# Patient Record
Sex: Male | Born: 1950 | Race: White | Hispanic: No | Marital: Married | Smoking: Former smoker
Health system: Southern US, Community
[De-identification: ages and names within clinical notes are randomized; demographics above are authoritative.]

## PROBLEM LIST (undated history)

## (undated) DIAGNOSIS — Z860100 Personal history of colon polyps, unspecified: Secondary | ICD-10-CM

## (undated) DIAGNOSIS — Z923 Personal history of irradiation: Secondary | ICD-10-CM

## (undated) DIAGNOSIS — I4891 Unspecified atrial fibrillation: Secondary | ICD-10-CM

## (undated) DIAGNOSIS — Z8739 Personal history of other diseases of the musculoskeletal system and connective tissue: Secondary | ICD-10-CM

## (undated) DIAGNOSIS — G4700 Insomnia, unspecified: Secondary | ICD-10-CM

## (undated) DIAGNOSIS — M2550 Pain in unspecified joint: Secondary | ICD-10-CM

## (undated) DIAGNOSIS — I70219 Atherosclerosis of native arteries of extremities with intermittent claudication, unspecified extremity: Secondary | ICD-10-CM

## (undated) DIAGNOSIS — R351 Nocturia: Secondary | ICD-10-CM

## (undated) DIAGNOSIS — M179 Osteoarthritis of knee, unspecified: Secondary | ICD-10-CM

## (undated) DIAGNOSIS — M1712 Unilateral primary osteoarthritis, left knee: Secondary | ICD-10-CM

## (undated) DIAGNOSIS — Z7189 Other specified counseling: Secondary | ICD-10-CM

## (undated) DIAGNOSIS — I639 Cerebral infarction, unspecified: Secondary | ICD-10-CM

## (undated) DIAGNOSIS — I6529 Occlusion and stenosis of unspecified carotid artery: Secondary | ICD-10-CM

## (undated) DIAGNOSIS — E785 Hyperlipidemia, unspecified: Secondary | ICD-10-CM

## (undated) DIAGNOSIS — E1151 Type 2 diabetes mellitus with diabetic peripheral angiopathy without gangrene: Secondary | ICD-10-CM

## (undated) DIAGNOSIS — C3490 Malignant neoplasm of unspecified part of unspecified bronchus or lung: Secondary | ICD-10-CM

## (undated) DIAGNOSIS — M1710 Unilateral primary osteoarthritis, unspecified knee: Secondary | ICD-10-CM

## (undated) DIAGNOSIS — I10 Essential (primary) hypertension: Secondary | ICD-10-CM

## (undated) DIAGNOSIS — I739 Peripheral vascular disease, unspecified: Secondary | ICD-10-CM

## (undated) HISTORY — DX: Peripheral vascular disease, unspecified: I73.9

## (undated) HISTORY — PX: OTHER SURGICAL HISTORY: SHX169

## (undated) HISTORY — DX: Unilateral primary osteoarthritis, unspecified knee: M17.10

## (undated) HISTORY — DX: Essential (primary) hypertension: I10

## (undated) HISTORY — PX: COLONOSCOPY: SHX174

## (undated) HISTORY — DX: Other specified counseling: Z71.89

## (undated) HISTORY — DX: Hyperlipidemia, unspecified: E78.5

## (undated) HISTORY — DX: Atherosclerosis of native arteries of extremities with intermittent claudication, unspecified extremity: I70.219

## (undated) HISTORY — DX: Type 2 diabetes mellitus with diabetic peripheral angiopathy without gangrene: E11.51

## (undated) HISTORY — DX: Osteoarthritis of knee, unspecified: M17.9

## (undated) HISTORY — DX: Occlusion and stenosis of unspecified carotid artery: I65.29

## (undated) HISTORY — DX: Personal history of irradiation: Z92.3

---

## 2004-07-15 ENCOUNTER — Ambulatory Visit (HOSPITAL_COMMUNITY): Admission: RE | Admit: 2004-07-15 | Discharge: 2004-07-15 | Payer: Self-pay | Admitting: Vascular Surgery

## 2005-11-21 ENCOUNTER — Encounter: Admission: RE | Admit: 2005-11-21 | Discharge: 2005-11-21 | Payer: Self-pay | Admitting: Family Medicine

## 2007-01-13 ENCOUNTER — Encounter: Admission: RE | Admit: 2007-01-13 | Discharge: 2007-01-13 | Payer: Self-pay | Admitting: Family Medicine

## 2009-11-02 HISTORY — PX: KNEE ARTHROSCOPY: SUR90

## 2009-11-02 HISTORY — PX: MENISCUS REPAIR: SHX5179

## 2011-11-10 ENCOUNTER — Encounter: Payer: Self-pay | Admitting: Vascular Surgery

## 2011-11-18 ENCOUNTER — Encounter: Payer: Self-pay | Admitting: Vascular Surgery

## 2011-11-19 ENCOUNTER — Ambulatory Visit (INDEPENDENT_AMBULATORY_CARE_PROVIDER_SITE_OTHER): Payer: 59 | Admitting: *Deleted

## 2011-11-19 ENCOUNTER — Encounter (INDEPENDENT_AMBULATORY_CARE_PROVIDER_SITE_OTHER): Payer: 59 | Admitting: *Deleted

## 2011-11-19 ENCOUNTER — Encounter: Payer: Self-pay | Admitting: Vascular Surgery

## 2011-11-19 ENCOUNTER — Ambulatory Visit (INDEPENDENT_AMBULATORY_CARE_PROVIDER_SITE_OTHER): Payer: 59 | Admitting: Vascular Surgery

## 2011-11-19 VITALS — BP 133/80 | HR 92 | Temp 98.2°F | Ht 73.0 in | Wt 211.0 lb

## 2011-11-19 DIAGNOSIS — I998 Other disorder of circulatory system: Secondary | ICD-10-CM | POA: Insufficient documentation

## 2011-11-19 DIAGNOSIS — M79609 Pain in unspecified limb: Secondary | ICD-10-CM

## 2011-11-19 DIAGNOSIS — I739 Peripheral vascular disease, unspecified: Secondary | ICD-10-CM

## 2011-11-19 DIAGNOSIS — I70219 Atherosclerosis of native arteries of extremities with intermittent claudication, unspecified extremity: Secondary | ICD-10-CM

## 2011-11-19 NOTE — Procedures (Unsigned)
LOWER EXTREMITY ARTERIAL DUPLEX  INDICATION:  Peripheral vascular disease, claudication.  HISTORY: Diabetes:  Yes. Cardiac:  No. Hypertension:  Yes. Smoking:  Yes. Previous Surgery:  SINGLE LEVEL ARTERIAL EXAM                         RIGHT                LEFT Brachial: Anterior tibial: Posterior tibial: Peroneal: Ankle/Brachial Index:   0.77                 0.48 Prior ABI: 06/13/2005   0.78                 0.56  LOWER EXTREMITY ARTERIAL DUPLEX EXAM   DUPLEX: 1. Right:  Significant plaque throughout the lower extremity with     visualized stenosis in the proximal to mid SFA with monophasic     waveforms from the proximal SFA on distally.  Dampened monophasic     waveform visualized at the posterior tibial artery. 2. Left:  No color-flow or Doppler signal proximal to distal femoral     artery with a collateral visualized in the distal femoral artery,     supplying flow to the popliteal artery.  IMPRESSION: 1. Proximal to mid right superficial femoral artery stenosis. 2. 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 3. Ankle brachial indices are on attached sheet.  ___________________________________________ Di Kindle. Edilia Bo, M.D.  SS/MEDQ  D:  11/19/2011  T:  11/19/2011  Job:  [id number]

## 2011-11-19 NOTE — Progress Notes (Signed)
Vascular and Vein Specialist of [facility]  Patient name: Jesse Avila MRN: [id number] DOB: Jun 25, 1951 Sex: male  REASON FOR CONSULT: Peripheral vascular disease referred by Dr. Jeanmarie Hubert  HPI: Jesse Avila is a 61 y.o. male who I had seen back in 2006 although I do not have these records. He's had a long history of bilateral lower extremity claudication. He experiences pain in his calves which is brought on by ambulation and relieved with rest. He symptoms start on the right side and then progressed the left side. This occurs after approximately 2 blocks. There were no other aggravating or alleviating factors. He's had no rest pain and no history of nonhealing wounds. He has had some burning and tingling in both feet consistent with neuropathy.  Apparently back in 2006 he was potentially a candidate for stenting of the superficial femoral artery although at that time he did not wish to proceed. I suspect that his disease has progressed significantly since that time.   Past Medical History  Diagnosis Date  . Hyperlipidemia   . Peripheral vascular disease     With moderate Claudication  . Hypertension   . Diabetes mellitus 8/20012    Type II  . GERD (gastroesophageal reflux disease)   . Arthritis     Gout  . ED (erectile dysfunction)   . 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 Diabetes Mother   . Hypertension Mother   . Heart disease Mother     Coronary Artery Bypass Graft  . Hyperlipidemia Mother   . Heart attack Mother   . Hypertension Father   . Hyperlipidemia Sister   . Stroke Sister     SOCIAL HISTORY: History  Substance Use Topics  . Smoking status: Current Everyday Smoker -- 1.0 packs/day for 30 years    Types: Cigarettes  . Smokeless tobacco: Never Used  . Alcohol Use: 7.2 oz/week    12 Cans of beer per week    Allergies  Allergen Reactions  . Cialis (Tadalafil) Other (See Comments)   Headache    Current Outpatient Prescriptions  Medication Sig Dispense Refill  . aspirin 81 MG tablet Take 81 mg by mouth daily.      . fenofibrate (TRICOR) 145 MG tablet Take 160 mg by mouth daily. For triglycerides      . glucose blood test strip 1 each by Other route as needed. Use as instructed      . olmesartan (BENICAR) 40 MG tablet Take 40 mg by mouth as directed.      . rosuvastatin (CRESTOR) 40 MG tablet Take 40 mg by mouth daily. For cholesterol      . cilostazol (PLETAL) 100 MG tablet Take 100 mg by mouth 2 (two) times daily. To reduce claudication symptoms        REVIEW OF SYSTEMS: Arly.Keller ] denotes positive finding; [  ] denotes negative finding  CARDIOVASCULAR:  [ ]  chest pain   [ ]  chest pressure   [ ]  palpitations   [ ]  orthopnea   [ ]  dyspnea on exertion   Arly.Keller ] claudication- bilateral   [ ]  rest pain   [ ]  DVT   [ ]  phlebitis PULMONARY:   [ ]  productive cough   [ ]  asthma   [ ]  wheezing NEUROLOGIC:   [ ]  weakness  [ ]  paresthesias  [ ]  aphasia  [ ]  amaurosis  [ ]   dizziness HEMATOLOGIC:   [ ]  bleeding problems   [ ]  clotting disorders MUSCULOSKELETAL:  [ ]  joint pain   [ ]  joint swelling [ ]  leg swelling GASTROINTESTINAL: [ ]   blood in stool  [ ]   hematemesis GENITOURINARY:  [ ]   dysuria  [ ]   hematuria PSYCHIATRIC:  [ ]  history of major depression INTEGUMENTARY:  [ ]  rashes  [ ]  ulcers CONSTITUTIONAL:  [ ]  fever   [ ]  chills  PHYSICAL EXAM: Filed Vitals:   11/19/11 1525  BP: 133/80  Pulse: 92  Temp: 98.2 F (36.8 C)  TempSrc: Oral  Height: 6\' 1"  (1.854 m)  Weight: 211 lb (95.709 kg)   Body mass index is 27.84 kg/(m^2). GENERAL: The patient is a well-nourished male, in no acute distress. The vital signs are documented above. CARDIOVASCULAR: There is a regular rate and rhythm without significant murmur appreciated. I do not detect carotid bruits. He has palpable femoral pulses. I cannot palpate popliteal or pedal pulses. Both feet appear adequately perfused. He has no  significant lower extremity swelling. PULMONARY: There is good air exchange bilaterally without wheezing or rales. ABDOMEN: Soft and non-tender with normal pitched bowel sounds. I do not palpate an abdominal aortic aneurysm. MUSCULOSKELETAL: There are no major deformities or cyanosis. NEUROLOGIC: No focal weakness or paresthesias are detected. SKIN: There are no ulcers or rashes noted. He has no ischemic ulcers. PSYCHIATRIC: The patient has a normal affect.  DATA:  I have independently interpreted his arterial Doppler study which shows an ABI of 77% on the right and 42% on the left. He has monophasic Doppler signals in the posterior tibial and dorsalis pedis positions bilaterally.  I've also independently interpreted his duplex of his native arteries which shows evidence of an occluded superficial femoral artery on the left with moderate to severe proximal superficial femoral artery occlusive disease on the right. This suggests significant progression of his disease on the left compared to his arteriogram back in 2006.  MEDICAL ISSUES:  PERIPHERAL VASCULAR DISEASE: This patient has bilateral infrainguinal arterial occlusive disease which is more significant on the left side. He has an occluded superficial femoral artery on the left with reconstitution of the popliteal artery. We have had a long discussion about the importance of tobacco cessation. His wife notes that Dr. Manus Gunning is also spent a lot of time discussing this with him. I have offered to refer [facility]. Also discussed the importance of a structured walking program. I've explained that his disease is likely progressed significantly since his arteriogram back in 2006 when he was amenable to angioplasty. At this point, based on his duplex he would more likely require a bypass graft. He feels that his symptoms are significantly disabling however he would like to proceed with arteriography at least to see what  options he has. I think this is reasonable.  I have reviewed with the patient the indications for arteriography. In addition, I have reviewed the potential complications of arteriography including but not limited to: Bleeding, arterial injury, arterial thrombosis, dye action, renal insufficiency, or other unpredictable medical problems. I have explained to the patient that if we find disease amenable to angioplasty we could potentially address this at the same time. I have discussed the potential complications of angioplasty and stenting, including but not limited to: Bleeding, arterial thrombosis, arterial injury, dissection, or the need for surgical intervention. His arteriogram is scheduled for 12/01/2011. We'll make further recommendations pending these results.  Joslin Doell S [facility] [place] [facility]: [phone]

## 2011-11-20 ENCOUNTER — Telehealth: Payer: Self-pay | Admitting: *Deleted

## 2011-11-20 NOTE — Telephone Encounter (Signed)
I called pt to schedule am agm with runoff possible intervention discussed with Dr Edilia Bo 11/19/11. Jesse Avila said he wanted to hold off at this time and would call me back when he decided to schedule.

## 2011-12-11 DIAGNOSIS — N529 Male erectile dysfunction, unspecified: Secondary | ICD-10-CM | POA: Insufficient documentation

## 2011-12-11 DIAGNOSIS — M1990 Unspecified osteoarthritis, unspecified site: Secondary | ICD-10-CM | POA: Insufficient documentation

## 2011-12-11 DIAGNOSIS — I739 Peripheral vascular disease, unspecified: Secondary | ICD-10-CM | POA: Insufficient documentation

## 2011-12-11 DIAGNOSIS — M1710 Unilateral primary osteoarthritis, unspecified knee: Secondary | ICD-10-CM | POA: Insufficient documentation

## 2011-12-11 DIAGNOSIS — I10 Essential (primary) hypertension: Secondary | ICD-10-CM | POA: Insufficient documentation

## 2011-12-11 DIAGNOSIS — K219 Gastro-esophageal reflux disease without esophagitis: Secondary | ICD-10-CM | POA: Insufficient documentation

## 2011-12-11 DIAGNOSIS — E785 Hyperlipidemia, unspecified: Secondary | ICD-10-CM | POA: Insufficient documentation

## 2011-12-12 ENCOUNTER — Ambulatory Visit (INDEPENDENT_AMBULATORY_CARE_PROVIDER_SITE_OTHER): Payer: 59 | Admitting: Cardiovascular Disease

## 2011-12-12 ENCOUNTER — Encounter: Payer: Self-pay | Admitting: Cardiovascular Disease

## 2011-12-12 VITALS — BP 120/71 | HR 76 | Ht 73.0 in | Wt 210.0 lb

## 2011-12-12 DIAGNOSIS — I70219 Atherosclerosis of native arteries of extremities with intermittent claudication, unspecified extremity: Secondary | ICD-10-CM

## 2011-12-12 DIAGNOSIS — E785 Hyperlipidemia, unspecified: Secondary | ICD-10-CM

## 2011-12-12 DIAGNOSIS — I739 Peripheral vascular disease, unspecified: Secondary | ICD-10-CM

## 2011-12-12 DIAGNOSIS — Z01810 Encounter for preprocedural cardiovascular examination: Secondary | ICD-10-CM

## 2011-12-12 DIAGNOSIS — I10 Essential (primary) hypertension: Secondary | ICD-10-CM

## 2011-12-12 NOTE — Patient Instructions (Addendum)
Your physician recommends that you schedule a follow-up appointment in:PER  PT WILL FOLLOW UP WITH DR Edilia Bo Your physician recommends that you continue on your current medications as directed. Please refer to the Current Medication list given to you today. Your physician has requested that you have a lexiscan myoview. For further information please visit [email or web address] Please follow instruction sheet, as given. DX PVD

## 2011-12-12 NOTE — Assessment & Plan Note (Addendum)
PVD moderate to severe on left ABI .56 .  ? Benefit to starting Pletal.  Given total occlusion I would think aortobimem is indicated.  Told patient that I dont think Dr Edilia Bo or our PV doctors should/would offer Rx until he quit smoking.  He understands the imperitive and will talk to his primary Dr Manus Gunning about welbutrin or Chantix.  Will schedule to see one of our PV doctors and patient can decide on who he wants to do angiogram.  However given total occlusion on most symptomatic side I suspect he will need to have Dr Edilia Bo perform surgery

## 2011-12-12 NOTE — Assessment & Plan Note (Signed)
HTN,PVD, Smoking and elevated lipids  Lexiscan myovue to clear for vascular surgery.

## 2011-12-12 NOTE — Progress Notes (Signed)
Patient ID: Jesse Avila, male   DOB: [birth year], 61 y.o.   MRN: [id number] 61 yo smoker seeking second opinion about PVD.  Unfortunately did not get set up with one of our PV doctors.  He is a chronic smoker.  Has been seen for a long time by Dr Cari Caraway.  They where a little taken back with last encounter.  ? They thought he would be a stent candidate over the years and recent ABI/Dulex suggested that surgery would be better option.  Reviewed study frmo 4/13  Ankle/Brachial Index: 0.77 0.48  Prior ABI: 06/13/2005 0.78 0.56  LOWER EXTREMITY ARTERIAL DUPLEX EXAM  DUPLEX:  1. Right: Significant plaque throughout the lower extremity with  visualized stenosis in the proximal to mid SFA with monophasic  waveforms from the proximal SFA on distally. Dampened monophasic  waveform visualized at the posterior tibial artery.  2. Left: No color-flow or Doppler signal proximal to distal femoral  artery with a collateral visualized in the distal femoral artery,  supplying flow to the popliteal artery.  IMPRESSION:  1. Proximal to mid right superficial femoral artery stenosis.  2. 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  3. Ankle brachial indices are on attached sheet.  ABI .56 on left and .75 on right  He has had claudication in calf for years Worse the last 6 months.  No resting pain or ulcers.  Works in [employer] and his PVD is affecting this.  No other vascular disease and no history of SSCP or CAD.    ROS: Denies fever, malais, weight loss, blurry vision, decreased visual acuity, cough, sputum, SOB, hemoptysis, pleuritic pain, palpitaitons, heartburn, abdominal pain, melena, lower extremity edema, claudication, or rash.  All other systems reviewed and negative   General: Affect appropriate Healthy:  appears stated age HEENT: normal Neck supple with no adenopathy JVP normal no bruits no thyromegaly Lungs clear with no wheezing and good diaphragmatic motion Heart:  S1/S2 no  murmur,rub, gallop or click PMI normal Abdomen: benighn, BS positve, no tenderness, no AAA Left femoral  bruit.  No HSM or HJR Plus one bilateral popliteal pulses Unable to palpate DP/PT No edema Neuro non-focal Skin warm and dry No muscular weakness  Medications Current Outpatient Prescriptions  Medication Sig Dispense Refill  . aspirin 81 MG tablet Take 81 mg by mouth daily.      . fenofibrate (TRICOR) 145 MG tablet Take 160 mg by mouth daily. For triglycerides      . glucose blood test strip 1 each by Other route as needed. Use as instructed      . olmesartan (BENICAR) 40 MG tablet Take 20 mg by mouth as directed.       . rosuvastatin (CRESTOR) 40 MG tablet Take 20 mg by mouth daily. For cholesterol        Allergies Cialis  Family History: Family History  Problem Relation Age of Onset  . Diabetes Mother   . Hypertension Mother   . Heart disease Mother     Coronary Artery Bypass Graft  . Hyperlipidemia Mother   . Heart attack Mother   . Hypertension Father   . Hyperlipidemia Sister   . Stroke Sister     Social History: History   Social History  . Marital Status: Married    Spouse Name: N/A    Number of Children: N/A  . Years of Education: N/A   Occupational History  . Not on file.   Social History Main Topics  .  Smoking status: Current Everyday Smoker -- 1.0 packs/day for 30 years    Types: Cigarettes  . Smokeless tobacco: Never Used  . Alcohol Use: 7.2 oz/week    12 Cans of beer per week  . Drug Use: No  . Sexually Active: Not on file   Other Topics Concern  . Not on file   Social History Narrative  . No narrative on file    Electrocardiogram:  Assessment and Plan

## 2011-12-12 NOTE — Assessment & Plan Note (Signed)
Cholesterol is at goal.  Continue current dose of statin and diet Rx.  No myalgias or side effects.  F/U  LFT's in 6 months. No results found for this basename: LDLCALC             

## 2011-12-12 NOTE — Assessment & Plan Note (Signed)
Well controlled.  Continue current medications and low sodium Dash type diet.    

## 2011-12-22 ENCOUNTER — Other Ambulatory Visit (HOSPITAL_COMMUNITY): Payer: 59

## 2012-02-18 ENCOUNTER — Encounter: Payer: Self-pay | Admitting: *Deleted

## 2012-02-18 ENCOUNTER — Other Ambulatory Visit: Payer: Self-pay | Admitting: *Deleted

## 2012-03-17 ENCOUNTER — Encounter (HOSPITAL_COMMUNITY): Payer: Self-pay | Admitting: Pharmacy Technician

## 2012-03-28 MED ORDER — SODIUM CHLORIDE 0.9 % IV SOLN
INTRAVENOUS | Status: DC
  Administered 2012-03-29: 07:00:00 via INTRAVENOUS

## 2012-03-29 ENCOUNTER — Other Ambulatory Visit: Payer: Self-pay | Admitting: *Deleted

## 2012-03-29 ENCOUNTER — Telehealth: Payer: Self-pay | Admitting: Vascular Surgery

## 2012-03-29 ENCOUNTER — Ambulatory Visit (HOSPITAL_COMMUNITY)
Admission: RE | Admit: 2012-03-29 | Discharge: 2012-03-29 | Disposition: A | Discharge status: Home / Self Care | Payer: 59 | Source: Ambulatory Visit | Attending: Vascular Surgery | Admitting: Vascular Surgery

## 2012-03-29 ENCOUNTER — Encounter (HOSPITAL_COMMUNITY)
Admission: RE | Disposition: A | Discharge status: Home / Self Care | Payer: Self-pay | Source: Ambulatory Visit | Attending: Vascular Surgery

## 2012-03-29 DIAGNOSIS — I739 Peripheral vascular disease, unspecified: Secondary | ICD-10-CM

## 2012-03-29 DIAGNOSIS — Z48812 Encounter for surgical aftercare following surgery on the circulatory system: Secondary | ICD-10-CM

## 2012-03-29 DIAGNOSIS — E785 Hyperlipidemia, unspecified: Secondary | ICD-10-CM | POA: Insufficient documentation

## 2012-03-29 DIAGNOSIS — M1710 Unilateral primary osteoarthritis, unspecified knee: Secondary | ICD-10-CM | POA: Insufficient documentation

## 2012-03-29 DIAGNOSIS — I708 Atherosclerosis of other arteries: Secondary | ICD-10-CM | POA: Insufficient documentation

## 2012-03-29 DIAGNOSIS — I70219 Atherosclerosis of native arteries of extremities with intermittent claudication, unspecified extremity: Secondary | ICD-10-CM

## 2012-03-29 DIAGNOSIS — K219 Gastro-esophageal reflux disease without esophagitis: Secondary | ICD-10-CM | POA: Insufficient documentation

## 2012-03-29 DIAGNOSIS — E119 Type 2 diabetes mellitus without complications: Secondary | ICD-10-CM | POA: Insufficient documentation

## 2012-03-29 DIAGNOSIS — I10 Essential (primary) hypertension: Secondary | ICD-10-CM | POA: Insufficient documentation

## 2012-03-29 HISTORY — PX: ABDOMINAL AORTAGRAM: SHX5454

## 2012-03-29 LAB — POCT I-STAT, CHEM 8
BUN: 15 mg/dL (ref 6–23)
Calcium, Ion: 1.21 mmol/L (ref 1.12–1.23)
Chloride: 110 mEq/L (ref 96–112)
Creatinine, Ser: 1.1 mg/dL (ref 0.50–1.35)
Glucose, Bld: 109 mg/dL — ABNORMAL HIGH (ref 70–99)
HCT: 39 % (ref 39.0–52.0)
Hemoglobin: 13.3 g/dL (ref 13.0–17.0)
Potassium: 3.9 mEq/L (ref 3.5–5.1)
Sodium: 143 mEq/L (ref 135–145)
TCO2: 21 mmol/L (ref 0–100)

## 2012-03-29 LAB — POCT ACTIVATED CLOTTING TIME
Activated Clotting Time: 195 seconds
Activated Clotting Time: 170 seconds

## 2012-03-29 LAB — GLUCOSE, CAPILLARY: Glucose-Capillary: 104 mg/dL — ABNORMAL HIGH (ref 70–99)

## 2012-03-29 SURGERY — ABDOMINAL AORTAGRAM
Anesthesia: LOCAL

## 2012-03-29 MED ORDER — ACETAMINOPHEN 325 MG PO TABS: 650.0000 mg | ORAL_TABLET | ORAL | Status: DC | PRN

## 2012-03-29 MED ORDER — FENTANYL CITRATE 0.05 MG/ML IJ SOLN
INTRAMUSCULAR | Status: AC
  Filled 2012-03-29: qty 2

## 2012-03-29 MED ORDER — MIDAZOLAM HCL 2 MG/2ML IJ SOLN
INTRAMUSCULAR | Status: AC
  Filled 2012-03-29: qty 2

## 2012-03-29 MED ORDER — CLOPIDOGREL BISULFATE 75 MG PO TABS: 75.0000 mg | ORAL_TABLET | Freq: Once | ORAL | Status: DC

## 2012-03-29 MED ORDER — HEPARIN (PORCINE) IN NACL 2-0.9 UNIT/ML-% IJ SOLN
INTRAMUSCULAR | Status: AC
  Filled 2012-03-29: qty 1000

## 2012-03-29 MED ORDER — ONDANSETRON HCL 4 MG/2ML IJ SOLN: 4.0000 mg | Freq: Four times a day (QID) | INTRAMUSCULAR | Status: DC | PRN

## 2012-03-29 MED ORDER — LIDOCAINE HCL (PF) 1 % IJ SOLN
INTRAMUSCULAR | Status: AC
  Filled 2012-03-29: qty 30

## 2012-03-29 MED ORDER — HEPARIN SODIUM (PORCINE) 1000 UNIT/ML IJ SOLN
INTRAMUSCULAR | Status: AC
  Filled 2012-03-29: qty 1

## 2012-03-29 MED ORDER — SODIUM CHLORIDE 0.9 % IV SOLN: 1.0000 mL/kg/h | INTRAVENOUS | Status: DC

## 2012-03-29 NOTE — Op Note (Signed)
PATIENT: Jesse Avila   MRN: [id number] DOB: [birth year]    DATE OF PROCEDURE: 03/29/2012  INDICATIONS: Jesse Avila is a 61 y.o. male with bilateral lower extremity claudication  PROCEDURE:  1. Ultrasound-guided access to bilateral common femoral arteries 2. Aortogram with bilateral iliac arteriogram and bilateral lower extremity runoff 3. Bilateral common iliac artery stenting using kissing balloon technique  SURGEON: Di Kindle. Edilia Bo, MD, FACS  ANESTHESIA: local with sedation   EBL: 100 cc  TECHNIQUE: The patient was brought to the peripheral vascular lab and received a milligram of Versed and 50 mcg of fentanyl. During the case he received additional sedation as documented. Both groins were prepped and draped in the usual sterile fashion. After the skin was anesthetized with 1% lidocaine, and under ultrasound guidance, the right common femoral artery was cannulated and a guidewire introduced into the iliac artery. A 5 French sheath was introduced over the wire. I was able to eventually pass the wire through a right common iliac artery stenosis. The pigtail catheter was positioned at the L1 vertebral body and flush aortogram obtained. The catheter was in position above the aortic bifurcation and oblique iliac projections were obtained. There was a tight 90% right common iliac artery stenosis and a moderate left common iliac artery stenosis. The stenosis in the right common iliac artery was near the bifurcation and therefore in order to address this I felt that it was necessary to use a kissing balloon technique in order to protect the left common iliac artery.  The 5 French sheath in the right groin was exchanged for a long 6 [place] sheath. Next under ultrasound guidance, after the skin was anesthetized, the left common iliac artery was cannulated and a guidewire introduced into the infrarenal aorta. A long 6 French sheath was introduced on the left. Pigtail catheter was then repositioned  above the aortic bifurcation and aortogram obtained for careful measurements to be made for kissing balloons. I selected a self-expanding 12 mm x 40 mm stent on the right and a 10 mm x 40 mm stent on the left. The 2 stents were positioned across the stenosis and the sheath retracted. They were deployed simultaneously with excellent positioning. There did not appear to be any residual stenosis on the left. On the right side the calcific plaque was not expanded by the stent. The wire had come back through the lesion and therefore multiple attempts were made in order to cross the stenosis within the stent. I was not able to cross the lesion from below despite multiple attempts using a Rosen wire, angled glide wire, and J-wire using a Kumpe catheter for support. Therefore crossed over from the left iliac system and was able to snare a wire to retract it through the right femoral sheath. Hour when trying to advance the catheter through the sheath it appeared that the wire was potentially caught in the strut and therefore this was abandoned. I attempted again to cross over from the left side using a crossover catheter but again they were concerned about whether or not the wire was through the stents and the strut. Rather than persist I felt it was safest not to continue attempts and risk damaging the stent. The only other option would be with him via a brachial approach but the patient had received significant contrast at this point. Thus it was still significant stenosis within the right common iliac artery. Bilateral lower extremity runoff films were also obtained.  FINDINGS:  1. The infrarenal aorta  is patent with no significant renal artery stenoses identified. 2. The common iliac arteries were stented using a kissing  Technique. There was significant residual stenosis within the right common iliac artery which could not be addressed as described above as I was unable to recross the stenosis through the stent  without catching the wire in the struts of the stent. 3. In the right lower extremity there is mild diffuse disease of the superficial femoral artery. On the right side the posterior tibial artery is occluded. The anterior tibial and peroneal arteries are patent on the right with moderate disease. There is a short segment occlusion of the proximal right anterior tibial artery. The common femoral and deep femoral arteries on the right are patent. 4. On the left side the common femoral artery and deep femoral artery are patent. The superficial femoral artery is occluded at its origin with reconstitution of the popliteal artery at the level of the knee. The dominant runoff on the left to see the anterior tibial artery. The posterior tibial artery is occluded. There is moderate diffuse disease of the peroneal artery which is occluded distally.   [name], MD, FACS [facility]  DATE OF DICTATION:   03/29/2012

## 2012-03-29 NOTE — Telephone Encounter (Addendum)
Message copied by Rosalyn Charters on Mon Mar 29, 2012  2:45 PM ------      Message from: Melene Plan      Created: Mon Mar 29, 2012 12:45 PM      Regarding: FW: charge and follow up                   ----- Message -----         From: Chuck Hint, MD         Sent: 03/29/2012  11:57 AM           To: Reuel Derby, Melene Plan, RN      Subject: charge and follow up                                     PROCEDURE:       1. Ultrasound-guided access to bilateral common femoral arteries      2. Aortogram with bilateral iliac arteriogram and bilateral lower extremity runoff      3. Bilateral common iliac artery stenting using kissing balloon technique            SURGEON: Di Kindle. Edilia Bo, MD, FACS            He needs a follow up appointment and ABIs in 2-3 weeks.            [facility]  notified patient of fu appt with dr. Edilia Bo on 04-14-12 unable to reach pt. by phone mailed appt. letter

## 2012-03-29 NOTE — H&P (Signed)
Vascular and Vein Specialist of [facility]  Patient name: Jesse Avila MRN: [id number] DOB: Feb 02, 1951 Sex:   HISTORY AND PHYSICAL EXAM  CC: Bilat lower extremity claudication   HPI: Jesse Avila is a 61 y.o. male who I evaluated back in April of this year. We discussed proceeding with arteriography, but at that time, he decided to wait. He's had a long history of bilateral lower extremity claudication. He experiences pain in his calves which is brought on by ambulation and relieved with rest. He symptoms start on the right side and then progressed the left side. This occurs after approximately 2 blocks. There were no other aggravating or alleviating factors. He's had no rest pain and no history of nonhealing wounds. He has had some burning and tingling in both feet consistent with neuropathy. His claudication symptoms have gradually worsened. He now wishes to proceed with arteriography.   Past Medical History  Diagnosis Date  . Hyperlipidemia   . Peripheral vascular disease     With moderate Claudication  . Hypertension   . Diabetes mellitus 8/20012    Type II  . GERD (gastroesophageal reflux disease)   . Arthritis     Gout  . ED (erectile dysfunction)   . 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 Diabetes Mother   . Hypertension Mother   . Heart disease Mother     Coronary Artery Bypass Graft  . Hyperlipidemia Mother   . Heart attack Mother   . Hypertension Father   . Hyperlipidemia Sister   . Stroke Sister     SOCIAL HISTORY: History  Substance Use Topics  . Smoking status: Current Everyday Smoker -- 1.0 packs/day for 30 years    Types: Cigarettes  . Smokeless tobacco: Never Used  . Alcohol Use: 7.2 oz/week    12 Cans of beer per week    Allergies  Allergen Reactions  . Cialis (Tadalafil) Other (See Comments)    Headache    Current Facility-Administered Medications  Medication Dose Route  Frequency Provider Last Rate Last Dose  . 0.9 %  sodium chloride infusion   Intravenous Continuous Chuck Hint, MD 100 mL/hr at 03/29/12 [phone]      REVIEW OF SYSTEMS: Arly.Keller ] denotes positive finding; [  ] denotes negative finding CARDIOVASCULAR:  [ ]  chest pain   [ ]  chest pressure   [ ]  palpitations   [ ]  orthopnea   [ ]  dyspnea on exertion   [ ]  claudication   [ ]  rest pain   [ ]  DVT   [ ]  phlebitis PULMONARY:   [ ]  productive cough   [ ]  asthma   [ ]  wheezing NEUROLOGIC:   [ ]  weakness  [ ]  paresthesias  [ ]  aphasia  [ ]  amaurosis  [ ]  dizziness HEMATOLOGIC:   [ ]  bleeding problems   [ ]  clotting disorders MUSCULOSKELETAL:  [ ]  joint pain   [ ]  joint swelling [ ]  leg swelling GASTROINTESTINAL: [ ]   blood in stool  [ ]   hematemesis GENITOURINARY:  [ ]   dysuria  [ ]   hematuria PSYCHIATRIC:  [ ]  history of major depression INTEGUMENTARY:  [ ]  rashes  [ ]  ulcers CONSTITUTIONAL:  [ ]  fever   [ ]  chills  PHYSICAL EXAM: Filed Vitals:   03/29/12 0704  BP: 142/79  Pulse: 69  Temp: 97.2 F (36.2 C)  TempSrc: Oral  Resp: 18  Height: 6\' 1"  (1.854 m)  Weight: 210 lb (95.255 kg)  SpO2: 96%   Body mass index is 27.71 kg/(m^2). GENERAL: The patient is a well-nourished male, in no acute distress. The vital signs are documented above. CARDIOVASCULAR: There is a regular rate and rhythm without significant murmur appreciated. He has palpable femoral pulses. I cannot palpate popliteal or pedal pulses. Both feet appear adequately perfused. He has no significant lower extremity swelling. PULMONARY: There is good air exchange bilaterally without wheezing or rales. ABDOMEN: Soft and non-tender with normal pitched bowel sounds.  MUSCULOSKELETAL: There are no major deformities or cyanosis. NEUROLOGIC: No focal weakness or paresthesias are detected. SKIN: There are no ulcers or rashes noted. PSYCHIATRIC: The patient has a normal affect.  DATA:  Lab Results  Component Value Date   HGB 13.3  03/29/2012   HCT 39.0 03/29/2012   Lab Results  Component Value Date   NA 143 03/29/2012   K 3.9 03/29/2012   CL 110 03/29/2012   Lab Results  Component Value Date   CREATININE 1.10 03/29/2012   MEDICAL ISSUES: Plan aortogram with runoff. I have reviewed with the patient the indications for arteriography. In addition, I have reviewed the potential complications of arteriography including but not limited to: Bleeding, arterial injury, arterial thrombosis, dye action, renal insufficiency, or other unpredictable medical problems. I have explained to the patient that if we find disease amenable to angioplasty we could potentially address this at the same time. I have discussed the potential complications of angioplasty and stenting, including but not limited to: Bleeding, arterial thrombosis, arterial injury, dissection, or the need for surgical intervention.   Diamone Whistler S [facility] Specialists of [place] Beeper: [phone]

## 2012-04-13 ENCOUNTER — Encounter: Payer: Self-pay | Admitting: Vascular Surgery

## 2012-04-14 ENCOUNTER — Encounter (INDEPENDENT_AMBULATORY_CARE_PROVIDER_SITE_OTHER): Payer: 59 | Admitting: *Deleted

## 2012-04-14 ENCOUNTER — Ambulatory Visit (INDEPENDENT_AMBULATORY_CARE_PROVIDER_SITE_OTHER): Payer: 59 | Admitting: Vascular Surgery

## 2012-04-14 ENCOUNTER — Encounter: Payer: Self-pay | Admitting: Vascular Surgery

## 2012-04-14 VITALS — BP 130/72 | HR 85 | Resp 16 | Ht 73.0 in | Wt 215.0 lb

## 2012-04-14 DIAGNOSIS — I739 Peripheral vascular disease, unspecified: Secondary | ICD-10-CM

## 2012-04-14 DIAGNOSIS — Z48812 Encounter for surgical aftercare following surgery on the circulatory system: Secondary | ICD-10-CM

## 2012-04-14 NOTE — Progress Notes (Signed)
Vascular and Vein Specialist of [facility]  Patient name: Jesse Avila MRN: [id number] DOB: [birth year] Sex: male  REASON FOR VISIT: follow up after bilateral iliac angioplasty and stenting  HPI: Jesse Avila is a 61 y.o. male who I seen back in April 2013 with bilateral lower extremity claudication. At that time he had agreed to a conservative approach. He later decided he wished to proceed with arteriography and on 03/29/12 he underwent aortogram with bilateral common iliac artery stenting using a kissing balloon technique. I was not able to fully deployed the right common iliac artery stent and was unable to cross the stent from the right side or over the aortic bifurcation from the left side. In addition he had a left superficial femoral artery occlusion with patent moderately disease right superficial femoral artery although this was patent. He comes in for follow up visit. He states that his symptoms in his lower extremities have not changed significantly. He still experiences claudication in both calves which is associated with ambulation and relieved with rest. He has quit smoking on 03/29/2012. In addition his wife to try to quit smoking.   REVIEW OF SYSTEMS: Arly.Keller ] denotes positive finding; [  ] denotes negative finding  CARDIOVASCULAR:  [ ]  chest pain   [ ]  dyspnea on exertion    CONSTITUTIONAL:  [ ]  fever   [ ]  chills  PHYSICAL EXAM: Filed Vitals:   04/14/12 1348  BP: 130/72  Pulse: 85  Resp: 16  Height: 6\' 1"  (1.854 m)  Weight: 215 lb (97.523 kg)   Body mass index is 28.37 kg/(m^2). GENERAL: The patient is a well-nourished male, in no acute distress. The vital signs are documented above. CARDIOVASCULAR: There is a regular rate and rhythm  PULMONARY: There is good air exchange bilaterally without wheezing or rales. He has palpable femoral pulses bilaterally. I cannot palpate pedal pulses although both feet are warm and well-perfused. ABI today is 45% on the right and 43% on the left.  He has monophasic Doppler signals in both feet.  MEDICAL ISSUES: I think the only other she to try to fully deploy the right common iliac artery stent would be to perform left brachial artery catheterization and, from above. I did explain that this is associated with slightly increased risk given the smaller size of the brachial artery. We've also discussed the option of continued conservative treatment with continued walking and remaining off of tobacco. Every does feel like his symptoms bother him significantly he would like to consider attempting again to address the proximal right common iliac artery stenosis. On the left leg he would require fem to below knee pop bypass grafting if his symptoms progress. Discuss this with his wife he decided he wishes to proceed with left brachial artery catheterization and attempted angioplasty of the right common iliac artery stenosis.  Abeer Deskins S V[facility] and Vein Specialists of [place] Beeper: [phone]

## 2012-04-20 ENCOUNTER — Encounter: Payer: Self-pay | Admitting: *Deleted

## 2012-04-21 ENCOUNTER — Other Ambulatory Visit: Payer: Self-pay | Admitting: *Deleted

## 2012-05-04 ENCOUNTER — Encounter (HOSPITAL_COMMUNITY): Payer: Self-pay | Admitting: Pharmacy Technician

## 2012-05-10 ENCOUNTER — Encounter (HOSPITAL_COMMUNITY)
Admission: RE | Disposition: A | Discharge status: Home / Self Care | Payer: Self-pay | Source: Ambulatory Visit | Attending: Vascular Surgery

## 2012-05-10 ENCOUNTER — Ambulatory Visit (HOSPITAL_COMMUNITY)
Admission: RE | Admit: 2012-05-10 | Discharge: 2012-05-10 | Disposition: A | Discharge status: Home / Self Care | Payer: 59 | Source: Ambulatory Visit | Attending: Vascular Surgery | Admitting: Vascular Surgery

## 2012-05-10 ENCOUNTER — Telehealth: Payer: Self-pay | Admitting: Vascular Surgery

## 2012-05-10 ENCOUNTER — Other Ambulatory Visit: Payer: Self-pay | Admitting: *Deleted

## 2012-05-10 DIAGNOSIS — I70219 Atherosclerosis of native arteries of extremities with intermittent claudication, unspecified extremity: Secondary | ICD-10-CM

## 2012-05-10 DIAGNOSIS — Z48812 Encounter for surgical aftercare following surgery on the circulatory system: Secondary | ICD-10-CM

## 2012-05-10 DIAGNOSIS — I739 Peripheral vascular disease, unspecified: Secondary | ICD-10-CM

## 2012-05-10 DIAGNOSIS — I708 Atherosclerosis of other arteries: Secondary | ICD-10-CM | POA: Insufficient documentation

## 2012-05-10 HISTORY — PX: PERCUTANEOUS STENT INTERVENTION: SHX5500

## 2012-05-10 LAB — POCT ACTIVATED CLOTTING TIME
Activated Clotting Time: 185 seconds
Activated Clotting Time: 174 seconds
Activated Clotting Time: 194 seconds

## 2012-05-10 LAB — GLUCOSE, CAPILLARY
Glucose-Capillary: 132 mg/dL — ABNORMAL HIGH (ref 70–99)
Glucose-Capillary: 99 mg/dL (ref 70–99)

## 2012-05-10 LAB — POCT I-STAT, CHEM 8
BUN: 16 mg/dL (ref 6–23)
Creatinine, Ser: 1 mg/dL (ref 0.50–1.35)
Potassium: 4.3 mEq/L (ref 3.5–5.1)
Sodium: 143 mEq/L (ref 135–145)
TCO2: 21 mmol/L (ref 0–100)

## 2012-05-10 SURGERY — PERCUTANEOUS STENT INTERVENTION
Anesthesia: LOCAL

## 2012-05-10 MED ORDER — ACETAMINOPHEN 325 MG PO TABS
650.0000 mg | ORAL_TABLET | ORAL | Status: DC | PRN
  Administered 2012-05-10: 650 mg via ORAL

## 2012-05-10 MED ORDER — ACETAMINOPHEN 325 MG PO TABS
ORAL_TABLET | ORAL | Status: AC
  Filled 2012-05-10: qty 2

## 2012-05-10 MED ORDER — MIDAZOLAM HCL 2 MG/2ML IJ SOLN
INTRAMUSCULAR | Status: AC
  Filled 2012-05-10: qty 2

## 2012-05-10 MED ORDER — ONDANSETRON HCL 4 MG/2ML IJ SOLN: 4.0000 mg | Freq: Four times a day (QID) | INTRAMUSCULAR | Status: DC | PRN

## 2012-05-10 MED ORDER — SODIUM CHLORIDE 0.9 % IV SOLN: 1.0000 mL/kg/h | INTRAVENOUS | Status: DC

## 2012-05-10 MED ORDER — HEPARIN (PORCINE) IN NACL 2-0.9 UNIT/ML-% IJ SOLN
INTRAMUSCULAR | Status: AC
  Filled 2012-05-10: qty 500

## 2012-05-10 MED ORDER — SODIUM CHLORIDE 0.9 % IV SOLN
INTRAVENOUS | Status: DC
  Administered 2012-05-10: 06:00:00 via INTRAVENOUS

## 2012-05-10 MED ORDER — HEPARIN SODIUM (PORCINE) 1000 UNIT/ML IJ SOLN
INTRAMUSCULAR | Status: AC
  Filled 2012-05-10: qty 1

## 2012-05-10 MED ORDER — FENTANYL CITRATE 0.05 MG/ML IJ SOLN
INTRAMUSCULAR | Status: AC
  Filled 2012-05-10: qty 2

## 2012-05-10 MED ORDER — LIDOCAINE HCL (PF) 1 % IJ SOLN
INTRAMUSCULAR | Status: AC
  Filled 2012-05-10: qty 30

## 2012-05-10 NOTE — Op Note (Signed)
PATIENT: Jesse Avila   MRN: [id number] DOB: 11/16/1950    DATE OF PROCEDURE: 05/10/2012  INDICATIONS: [name] is a 61 y.o. male who underwent aortogram and bilateral common iliac artery stents using a kissing balloon technique. The right common iliac artery stent did not fully open and I was unable to get through the stent from the right groin on the left groin. He is brought in for attempted angioplasty of the right common iliac artery via a left brachial approach.  PROCEDURE:  1. Ultrasound-guided access of the left brachial artery 2. Selective catheterization of the infrarenal aorta with aortogram 3.  Angioplasty of right common iliac artery stenosis with 8 mm x 2 cm balloon and 9 mm x 2 cm balloon  SURGEON: Di Kindle. Edilia Bo, MD, FACS  ANESTHESIA: Local with sedation   EBL: minimal  TECHNIQUE: The patient was brought to the peripheral vascular lab and sedated with 1 mg of Versed and 50 mcg of fentanyl. He then received an additional milligram of Versed. The left arm was prepped and draped in usual sterile fashion the skin was anesthetized with 1% lidocaine, and under ultrasound guidance the more superficial arterial branch which appeared to be reasonable size was cannulated with a micropuncture needle. A micropuncture sheath was introduced over a wire. This sheath was then exchanged for a 5 French sheath and the patient received 200 mcg of nitroglycerin in 2000 units of IV heparin through the sheath. A woolly wire was then advanced into the infrarenal aorta and a JR 4 catheter was advanced down to the distal aorta. I was unable to advance the wire through the right common iliac artery stents. The catheter was advanced over the wire and the woolly wire was exchanged for a long Rosen wire. The short 5 French sheath was then exchanged for a long 6 [place] sheath which was advanced over the [place] wire to the distal aorta.the patient received 5000 units of IV heparin. The ACT was 185. The  patient therefore received an additional 2000 units of IV heparin.  An aortogram was then obtained. An 8 mm x 2 cm Powerflex balloon was then positioned across the stenosis and inflated to 12 atmospheres for 1 minute. Patient arteriogram showed some mild residual stenosis. I then went back with a 9 mm x 2 cm balloon which was inflated to 12 atmospheres for 60 seconds. Please film showed good result. The long 6 French sheath was exchanged for a short 6 [place] sheath. The patient was transferred to the pulmonary artery had the sheath removed once the ACT was less than 175.  FINDINGS:  1. 60-70% residual stenosis within the right common iliac artery stent which was successfully ballooned with no residual stenosis.  2. The left common iliac artery stent was widely patent.  [name], MD, FACS Vascular and Vein Specialists of [facility]  DATE OF DICTATION:   05/10/2012

## 2012-05-10 NOTE — Telephone Encounter (Signed)
Message copied by Margaretmary Eddy on Mon May 10, 2012 10:44 AM ------      Message from: Melene Plan      Created: Mon May 10, 2012  9:48 AM      Regarding: FW: charge and F/U                   ----- Message -----         From: Chuck Hint, MD         Sent: 05/10/2012   8:53 AM           To: Reuel Derby, Melene Plan, RN      Subject: charge and F/U                                           PROCEDURE:       1. Ultrasound-guided access of the left brachial artery      2. Selective catheterization of the infrarenal aorta with aortogram      3.  Angioplasty of right common iliac artery stenosis with 8 mm x 2 cm balloon and 9 mm x 2 cm balloon            SURGEON: Di Kindle. Edilia Bo, MD, FACS                  A follow up visit in 3-4 weeks. With ABIs.      Thanks      CSD

## 2012-05-20 ENCOUNTER — Other Ambulatory Visit: Payer: Self-pay | Admitting: *Deleted

## 2012-05-20 ENCOUNTER — Encounter: Payer: Self-pay | Admitting: Vascular Surgery

## 2012-05-20 ENCOUNTER — Ambulatory Visit (INDEPENDENT_AMBULATORY_CARE_PROVIDER_SITE_OTHER): Payer: 59 | Admitting: Vascular Surgery

## 2012-05-20 VITALS — BP 125/69 | HR 82 | Temp 98.2°F | Ht 73.0 in | Wt 216.0 lb

## 2012-05-20 DIAGNOSIS — Z48812 Encounter for surgical aftercare following surgery on the circulatory system: Secondary | ICD-10-CM

## 2012-05-20 DIAGNOSIS — M79603 Pain in arm, unspecified: Secondary | ICD-10-CM

## 2012-05-20 DIAGNOSIS — I70219 Atherosclerosis of native arteries of extremities with intermittent claudication, unspecified extremity: Secondary | ICD-10-CM

## 2012-05-20 DIAGNOSIS — M79609 Pain in unspecified limb: Secondary | ICD-10-CM

## 2012-05-20 MED ORDER — CEPHALEXIN 500 MG PO CAPS: 500.0000 mg | ORAL_CAPSULE | Freq: Four times a day (QID) | ORAL | Status: AC

## 2012-05-20 MED ORDER — CEPHALEXIN 500 MG PO CAPS: 500.0000 mg | ORAL_CAPSULE | Freq: Four times a day (QID) | ORAL | Status: DC

## 2012-05-20 NOTE — Progress Notes (Signed)
Patient is a 61 year old male who recently underwent bilateral iliac stenting by Dr. Edilia Bo. An angioplasty was performed through a left brachial approach. The patient noticed swelling and tenderness over his left arm that has been increasing over the last few days. He denies any fever chills or drainage. He has no numbness or tingling in the arm.  Physical exam: Filed Vitals:   05/20/12 1553  BP: 125/69  Pulse: 82  Temp: 98.2 F (36.8 C)  TempSrc: Oral  Height: 6\' 1"  (1.854 m)  Weight: 216 lb (97.977 kg)  SpO2: 98%   Left upper extremity: 2+ brachial and radial pulse, 3 cm area of erythema over his needle puncture site in the left upper arm. This is nonpulsatile.  Data: The patient had an arterial duplex exam today to rule out pseudoaneurysm. There is no pseudoaneurysm present. I reviewed and interpreted this study.  Assessment: Mild superficial skin infection left arm at needle puncture site without evidence of pseudoaneurysm  Plan: The patient was placed on a 10 day course of Keflex today. He will followup with me in 2 weeks since Dr. Edilia Bo will be out of town that week.  Fabienne Bruns, MD [facility] Ve[facility] Specialists of [place] Office: [phone] Pager: [phone]

## 2012-05-20 NOTE — Progress Notes (Signed)
Limited left upper extremity arterial duplex performed @ [facility] 05/20/2012

## 2012-05-26 ENCOUNTER — Encounter: Payer: Self-pay | Admitting: Vascular Surgery

## 2012-06-03 ENCOUNTER — Ambulatory Visit: Payer: 59 | Admitting: Vascular Surgery

## 2012-06-08 ENCOUNTER — Encounter: Payer: Self-pay | Admitting: Vascular Surgery

## 2012-06-09 ENCOUNTER — Encounter (INDEPENDENT_AMBULATORY_CARE_PROVIDER_SITE_OTHER): Payer: 59 | Admitting: *Deleted

## 2012-06-09 ENCOUNTER — Encounter: Payer: Self-pay | Admitting: Vascular Surgery

## 2012-06-09 ENCOUNTER — Ambulatory Visit (INDEPENDENT_AMBULATORY_CARE_PROVIDER_SITE_OTHER): Payer: 59 | Admitting: Vascular Surgery

## 2012-06-09 VITALS — BP 150/83 | HR 72 | Resp 16 | Ht 73.0 in | Wt 219.0 lb

## 2012-06-09 DIAGNOSIS — Z48812 Encounter for surgical aftercare following surgery on the circulatory system: Secondary | ICD-10-CM

## 2012-06-09 DIAGNOSIS — I70219 Atherosclerosis of native arteries of extremities with intermittent claudication, unspecified extremity: Secondary | ICD-10-CM

## 2012-06-09 DIAGNOSIS — I739 Peripheral vascular disease, unspecified: Secondary | ICD-10-CM

## 2012-06-09 DIAGNOSIS — M79609 Pain in unspecified limb: Secondary | ICD-10-CM

## 2012-06-09 NOTE — Progress Notes (Signed)
Vascular and Vein Specialist of [facility]  Patient name: Jesse Avila MRN: [id number] DOB: [birth year] Sex: male  REASON FOR VISIT: follow up after right common iliac artery stent.  HPI: Jesse Avila is a 61 y.o. male who I have been following with bilateral lower extremity claudication. Ultimately his symptoms progressed and he wished to proceed with arteriography. On 03/29/2012 he had an aortogram with bilateral common iliac artery stenting using a kissing balloon technique. I was unable to fully deploy the common iliac artery stent on the right. He was brought back and on 05/10/2012 underwent repeat aortogram via a left brachial approach at that time I was successfully able to traverse the stent and fully deployed the right common iliac artery stent. He comes in for a follow up visit. He states his symptoms in his right leg have improved. He does have significant infrainguinal arterial occlusive disease bilaterally. He has mild bilateral lower extremity claudication. He denies rest pain.   REVIEW OF SYSTEMS: Arly.Keller ] denotes positive finding; [  ] denotes negative finding  CARDIOVASCULAR:  [ ]  chest pain   [ ]  dyspnea on exertion    CONSTITUTIONAL:  [ ]  fever   [ ]  chills  PHYSICAL EXAM: Filed Vitals:   06/09/12 1146  BP: 150/83  Pulse: 72  Resp: 16  Height: 6\' 1"  (1.854 m)  Weight: 219 lb (99.338 kg)  SpO2: 98%   Body mass index is 28.89 kg/(m^2). GENERAL: The patient is a well-nourished male, in no acute distress. The vital signs are documented above. CARDIOVASCULAR: There is a regular rate and rhythm. He has normal femoral pulses bilaterally. Both feet are warm and well-perfused. PULMONARY: There is good air exchange bilaterally without wheezing or rales. His left brachial artery catheterization site looks fine. He apparently had some swelling here and was seen by Dr. Darrick Penna and placed on antibiotics which resolved this issue.  MEDICAL ISSUES: Patient is doing well status post  bilateral common iliac artery stents. Were gently he is not smoking. I've encouraged him to stay as active as possible. He is on Plavix. He does have bilateral infrainguinal arterial occlusive disease but hopefully can avoid revascularization if he continues with a structured walking program. I'll see him back in 6 months with follow up ABIs in duplex of his stents. He knows to call sooner if he has problems.  Krisy Dix S Vascular and Vein Specialists of [place] Beeper: [phone]

## 2012-10-13 ENCOUNTER — Ambulatory Visit: Payer: 59 | Admitting: Vascular Surgery

## 2012-12-08 ENCOUNTER — Ambulatory Visit: Payer: 59 | Admitting: Neurosurgery

## 2012-12-08 ENCOUNTER — Other Ambulatory Visit: Payer: 59

## 2012-12-24 ENCOUNTER — Other Ambulatory Visit: Payer: 59

## 2012-12-29 ENCOUNTER — Other Ambulatory Visit: Payer: 59

## 2012-12-31 ENCOUNTER — Encounter (INDEPENDENT_AMBULATORY_CARE_PROVIDER_SITE_OTHER): Payer: 59 | Admitting: *Deleted

## 2012-12-31 ENCOUNTER — Other Ambulatory Visit: Payer: Self-pay | Admitting: *Deleted

## 2012-12-31 ENCOUNTER — Other Ambulatory Visit (INDEPENDENT_AMBULATORY_CARE_PROVIDER_SITE_OTHER): Payer: 59 | Admitting: *Deleted

## 2012-12-31 DIAGNOSIS — Z48812 Encounter for surgical aftercare following surgery on the circulatory system: Secondary | ICD-10-CM

## 2012-12-31 DIAGNOSIS — I70219 Atherosclerosis of native arteries of extremities with intermittent claudication, unspecified extremity: Secondary | ICD-10-CM

## 2013-02-02 ENCOUNTER — Ambulatory Visit: Payer: 59 | Admitting: Vascular Surgery

## 2013-06-17 ENCOUNTER — Encounter: Payer: Self-pay | Admitting: Podiatry

## 2013-06-17 ENCOUNTER — Ambulatory Visit (INDEPENDENT_AMBULATORY_CARE_PROVIDER_SITE_OTHER): Payer: 59 | Admitting: Podiatry

## 2013-06-17 ENCOUNTER — Ambulatory Visit (INDEPENDENT_AMBULATORY_CARE_PROVIDER_SITE_OTHER): Payer: 59

## 2013-06-17 VITALS — BP 102/56 | HR 101 | Resp 16 | Ht 73.0 in | Wt 210.0 lb

## 2013-06-17 DIAGNOSIS — M79609 Pain in unspecified limb: Secondary | ICD-10-CM

## 2013-06-17 DIAGNOSIS — M779 Enthesopathy, unspecified: Secondary | ICD-10-CM

## 2013-06-17 DIAGNOSIS — M79672 Pain in left foot: Secondary | ICD-10-CM

## 2013-06-17 DIAGNOSIS — M216X9 Other acquired deformities of unspecified foot: Secondary | ICD-10-CM

## 2013-06-17 MED ORDER — TRIAMCINOLONE ACETONIDE 10 MG/ML IJ SUSP
5.0000 mg | Freq: Once | INTRAMUSCULAR | Status: AC
  Administered 2013-06-17: 5 mg via INTRA_ARTICULAR

## 2013-06-17 NOTE — Progress Notes (Signed)
Subjective:     Patient ID: Jesse Avila, male   DOB: 08-Oct-1950, 62 y.o.   MRN: [id number]  Foot Pain   patient presents stating I'm having pain underneath my fifth metatarsal of both feet. States he saw another podiatrist and trimmed the area but that has only giving him temporary relief and he would like some kind of an orthotic to try to help him and states that the right one has been hurting him more than the left   Review of Systems  All other systems reviewed and are negative.       Objective:   Physical Exam  Constitutional: He is oriented to person, place, and time. He appears well-developed.  Cardiovascular: Intact distal pulses.   Musculoskeletal: Normal range of motion.  Neurological: He is oriented to person, place, and time.  Skin: Skin is warm.   patient is found to have plantar flexed fifth metatarsals bilateral with inflammation around the head and pain when pressed with keratotic lesion noted. Neurovascular status found to be intact with normal muscle strength and no equinus condition noted     Assessment:     Plantar flexed fifth metatarsals both feet with inflammation and fluid noted right sub-5 metatarsal head    Plan:     H&P and x-rays reviewed with patient. Careful steroid injected around the right fifth MPJ 3 mg Kenalog 5 mg Xylocaine and orthotics and accomplished today to reduce stress against the bone and hopefully prevent this patient from requiring surgery. Reappoint when orthotics return

## 2013-06-17 NOTE — Progress Notes (Signed)
  Subjective:    Patient ID: Jesse Avila, male    DOB: 02-23-1951, 62 y.o.   MRN: [id number]  HPI    Review of Systems  Constitutional: Negative.   HENT: Negative.   Eyes: Negative.   Respiratory: Negative.   Cardiovascular:       CALF PAIN  Gastrointestinal: Negative.   Endocrine: Negative.   Genitourinary: Negative.   Musculoskeletal:       JOINT PAIN  Skin: Negative.   Allergic/Immunologic: Negative.   Neurological: Negative.   Hematological: Negative.   Psychiatric/Behavioral: Negative.        Objective:   Physical Exam        Assessment & Plan:

## 2013-06-17 NOTE — Progress Notes (Signed)
N PAIN L B/L FOOT CALLUS D ONE YEAR AND 37M O SLOWLY C BETTER A STANDING T PT HAS THEM "DUG" OUT, SCRAPES IT, ACID PATCHES, SEEN PODIATRIST IN [place]

## 2013-08-19 ENCOUNTER — Ambulatory Visit: Payer: 59 | Admitting: Podiatry

## 2013-09-30 ENCOUNTER — Ambulatory Visit (INDEPENDENT_AMBULATORY_CARE_PROVIDER_SITE_OTHER): Payer: 59 | Admitting: Podiatry

## 2013-09-30 DIAGNOSIS — M779 Enthesopathy, unspecified: Secondary | ICD-10-CM

## 2013-09-30 NOTE — Patient Instructions (Signed)

## 2013-10-02 NOTE — Progress Notes (Signed)
Subjective:     Patient ID: Jesse Avila, male   DOB: [birth year], 63 y.o.   MRN: [id number]  HPI patient presents to pick up orthotics   Review of Systems     Objective:   Physical Exam No change    Assessment:     Chronic tendinitis    Plan:     Orthotics dispensed fit well with instructions given to patient

## 2013-10-18 ENCOUNTER — Ambulatory Visit (INDEPENDENT_AMBULATORY_CARE_PROVIDER_SITE_OTHER): Payer: 59 | Admitting: Physician Assistant

## 2013-10-18 VITALS — BP 122/72 | HR 101 | Temp 98.4°F | Resp 17 | Ht 71.5 in | Wt 213.0 lb

## 2013-10-18 DIAGNOSIS — M109 Gout, unspecified: Secondary | ICD-10-CM

## 2013-10-18 DIAGNOSIS — G4700 Insomnia, unspecified: Secondary | ICD-10-CM

## 2013-10-18 MED ORDER — INDOMETHACIN 50 MG PO CAPS: 50.0000 mg | ORAL_CAPSULE | Freq: Three times a day (TID) | ORAL | Status: DC

## 2013-10-18 MED ORDER — ZOLPIDEM TARTRATE 10 MG PO TABS: 10.0000 mg | ORAL_TABLET | Freq: Every evening | ORAL | Status: DC | PRN

## 2013-10-18 NOTE — Patient Instructions (Signed)

## 2013-10-18 NOTE — Progress Notes (Signed)
   Subjective:    Patient ID: Jesse Avila, male    DOB: [birth year], 63 y.o.   MRN: [id number]  HPI  Pt presents to clinic with left foot pain that started last pm in the joint of his big toe.  He had trouble walking on it due to the pain.  He had this same thing in the same location about 1.5 years ago and he was told he had gout and this feels exactly the same.    He sees [facility] for his regular medical care but several years ago we gave him something to help him sleep and he would like some more.  He has not talked to his PCP about the need for this type of medication.  Review of Systems     Objective:   Physical Exam  Vitals reviewed. Constitutional: He is oriented to person, place, and time. He appears well-developed and well-nourished.  HENT:  Head: Normocephalic and atraumatic.  Right Ear: External ear normal.  Left Ear: External ear normal.  Pulmonary/Chest: Effort normal.  Musculoskeletal:       Feet:  Neurological: He is alert and oriented to person, place, and time.  Skin: Skin is warm and dry.  Psychiatric: He has a normal mood and affect. His behavior is normal. Judgment and thought content normal.       Assessment & Plan:  Gout - We will start treatment - he has not had an attack in 1.5 years so daily medication at this point does not make sense but we will check a uric acid and determine the next step at that time. Plan: indomethacin (INDOCIN) 50 MG capsule, Uric acid  Insomnia - Reviewed his old chart - discussed with pt that I will give him a month supply but this is most appropriate from his PCP and he should f/u at [facility].  Plan: zolpidem (AMBIEN) 10 MG tablet  Windell Hummingbird PA-C  [facility] 10/18/2013 7:28 PM

## 2013-10-19 LAB — URIC ACID: URIC ACID, SERUM: 4.4 mg/dL (ref 4.0–7.8)

## 2014-04-14 ENCOUNTER — Inpatient Hospital Stay (HOSPITAL_COMMUNITY)
Admission: EM | Admit: 2014-04-14 | Discharge: 2014-04-16 | DRG: 065 | Disposition: A | Discharge status: Home / Self Care | Payer: 59 | Attending: Internal Medicine | Admitting: Internal Medicine

## 2014-04-14 ENCOUNTER — Emergency Department (HOSPITAL_COMMUNITY): Payer: 59

## 2014-04-14 ENCOUNTER — Inpatient Hospital Stay (HOSPITAL_COMMUNITY): Payer: 59

## 2014-04-14 ENCOUNTER — Encounter (HOSPITAL_COMMUNITY): Payer: Self-pay | Admitting: Emergency Medicine

## 2014-04-14 DIAGNOSIS — Z96659 Presence of unspecified artificial knee joint: Secondary | ICD-10-CM | POA: Diagnosis not present

## 2014-04-14 DIAGNOSIS — IMO0002 Reserved for concepts with insufficient information to code with codable children: Secondary | ICD-10-CM

## 2014-04-14 DIAGNOSIS — E119 Type 2 diabetes mellitus without complications: Secondary | ICD-10-CM | POA: Diagnosis present

## 2014-04-14 DIAGNOSIS — Z87891 Personal history of nicotine dependence: Secondary | ICD-10-CM

## 2014-04-14 DIAGNOSIS — E669 Obesity, unspecified: Secondary | ICD-10-CM | POA: Diagnosis present

## 2014-04-14 DIAGNOSIS — Z79899 Other long term (current) drug therapy: Secondary | ICD-10-CM | POA: Diagnosis not present

## 2014-04-14 DIAGNOSIS — Z7982 Long term (current) use of aspirin: Secondary | ICD-10-CM

## 2014-04-14 DIAGNOSIS — G4700 Insomnia, unspecified: Secondary | ICD-10-CM

## 2014-04-14 DIAGNOSIS — K219 Gastro-esophageal reflux disease without esophagitis: Secondary | ICD-10-CM | POA: Diagnosis present

## 2014-04-14 DIAGNOSIS — M109 Gout, unspecified: Secondary | ICD-10-CM | POA: Diagnosis present

## 2014-04-14 DIAGNOSIS — Z01810 Encounter for preprocedural cardiovascular examination: Secondary | ICD-10-CM

## 2014-04-14 DIAGNOSIS — I6350 Cerebral infarction due to unspecified occlusion or stenosis of unspecified cerebral artery: Secondary | ICD-10-CM | POA: Diagnosis not present

## 2014-04-14 DIAGNOSIS — I639 Cerebral infarction, unspecified: Secondary | ICD-10-CM | POA: Diagnosis present

## 2014-04-14 DIAGNOSIS — G8194 Hemiplegia, unspecified affecting left nondominant side: Secondary | ICD-10-CM

## 2014-04-14 DIAGNOSIS — Z6829 Body mass index (BMI) 29.0-29.9, adult: Secondary | ICD-10-CM | POA: Diagnosis not present

## 2014-04-14 DIAGNOSIS — Z8673 Personal history of transient ischemic attack (TIA), and cerebral infarction without residual deficits: Secondary | ICD-10-CM | POA: Diagnosis not present

## 2014-04-14 DIAGNOSIS — I70219 Atherosclerosis of native arteries of extremities with intermittent claudication, unspecified extremity: Secondary | ICD-10-CM | POA: Diagnosis present

## 2014-04-14 DIAGNOSIS — E785 Hyperlipidemia, unspecified: Secondary | ICD-10-CM

## 2014-04-14 DIAGNOSIS — I6523 Occlusion and stenosis of bilateral carotid arteries: Secondary | ICD-10-CM

## 2014-04-14 DIAGNOSIS — I6529 Occlusion and stenosis of unspecified carotid artery: Secondary | ICD-10-CM | POA: Diagnosis present

## 2014-04-14 DIAGNOSIS — I10 Essential (primary) hypertension: Secondary | ICD-10-CM | POA: Diagnosis present

## 2014-04-14 DIAGNOSIS — I159 Secondary hypertension, unspecified: Secondary | ICD-10-CM

## 2014-04-14 DIAGNOSIS — E1151 Type 2 diabetes mellitus with diabetic peripheral angiopathy without gangrene: Secondary | ICD-10-CM

## 2014-04-14 DIAGNOSIS — I739 Peripheral vascular disease, unspecified: Secondary | ICD-10-CM | POA: Diagnosis present

## 2014-04-14 DIAGNOSIS — M1990 Unspecified osteoarthritis, unspecified site: Secondary | ICD-10-CM

## 2014-04-14 DIAGNOSIS — G8190 Hemiplegia, unspecified affecting unspecified side: Secondary | ICD-10-CM | POA: Diagnosis present

## 2014-04-14 DIAGNOSIS — I998 Other disorder of circulatory system: Secondary | ICD-10-CM | POA: Diagnosis present

## 2014-04-14 DIAGNOSIS — I70229 Atherosclerosis of native arteries of extremities with rest pain, unspecified extremity: Secondary | ICD-10-CM | POA: Diagnosis present

## 2014-04-14 LAB — URINALYSIS, ROUTINE W REFLEX MICROSCOPIC
Bilirubin Urine: NEGATIVE
GLUCOSE, UA: 100 mg/dL — AB
Hgb urine dipstick: NEGATIVE
Ketones, ur: NEGATIVE mg/dL
LEUKOCYTES UA: NEGATIVE
Nitrite: NEGATIVE
Protein, ur: NEGATIVE mg/dL
Specific Gravity, Urine: 1.013 (ref 1.005–1.030)
Urobilinogen, UA: 0.2 mg/dL (ref 0.0–1.0)
pH: 6.5 (ref 5.0–8.0)

## 2014-04-14 LAB — I-STAT CHEM 8, ED
BUN: 15 mg/dL (ref 6–23)
CHLORIDE: 107 meq/L (ref 96–112)
Calcium, Ion: 1.23 mmol/L (ref 1.13–1.30)
Creatinine, Ser: 1 mg/dL (ref 0.50–1.35)
GLUCOSE: 164 mg/dL — AB (ref 70–99)
HEMATOCRIT: 37 % — AB (ref 39.0–52.0)
Hemoglobin: 12.6 g/dL — ABNORMAL LOW (ref 13.0–17.0)
POTASSIUM: 4.1 meq/L (ref 3.7–5.3)
Sodium: 139 mEq/L (ref 137–147)
TCO2: 24 mmol/L (ref 0–100)

## 2014-04-14 LAB — DIFFERENTIAL
BASOS ABS: 0 10*3/uL (ref 0.0–0.1)
BASOS PCT: 1 % (ref 0–1)
Eosinophils Absolute: 0.3 10*3/uL (ref 0.0–0.7)
Eosinophils Relative: 8 % — ABNORMAL HIGH (ref 0–5)
Lymphocytes Relative: 37 % (ref 12–46)
Lymphs Abs: 1.3 10*3/uL (ref 0.7–4.0)
Monocytes Absolute: 0.3 10*3/uL (ref 0.1–1.0)
Monocytes Relative: 8 % (ref 3–12)
NEUTROS PCT: 48 % (ref 43–77)
Neutro Abs: 1.6 10*3/uL — ABNORMAL LOW (ref 1.7–7.7)

## 2014-04-14 LAB — COMPREHENSIVE METABOLIC PANEL
ALK PHOS: 54 U/L (ref 39–117)
ALT: 22 U/L (ref 0–53)
ANION GAP: 14 (ref 5–15)
AST: 24 U/L (ref 0–37)
Albumin: 4.5 g/dL (ref 3.5–5.2)
BUN: 16 mg/dL (ref 6–23)
CO2: 23 mEq/L (ref 19–32)
Calcium: 9.8 mg/dL (ref 8.4–10.5)
Chloride: 101 mEq/L (ref 96–112)
Creatinine, Ser: 0.92 mg/dL (ref 0.50–1.35)
GFR calc Af Amer: >90 mL/min (ref >90)
GFR calc non Af Amer: 90 mL/min — ABNORMAL LOW (ref >90)
Glucose, Bld: 161 mg/dL — ABNORMAL HIGH (ref 70–99)
POTASSIUM: 4.2 meq/L (ref 3.7–5.3)
SODIUM: 138 meq/L (ref 137–147)
TOTAL PROTEIN: 7.7 g/dL (ref 6.0–8.3)
Total Bilirubin: 0.4 mg/dL (ref 0.3–1.2)

## 2014-04-14 LAB — GLUCOSE, CAPILLARY: GLUCOSE-CAPILLARY: 150 mg/dL — AB (ref 70–99)

## 2014-04-14 LAB — CBC
HCT: 36.5 % — ABNORMAL LOW (ref 39.0–52.0)
Hemoglobin: 12.7 g/dL — ABNORMAL LOW (ref 13.0–17.0)
MCH: 32.5 pg (ref 26.0–34.0)
MCHC: 34.8 g/dL (ref 30.0–36.0)
MCV: 93.4 fL (ref 78.0–100.0)
PLATELETS: 169 10*3/uL (ref 150–400)
RBC: 3.91 MIL/uL — ABNORMAL LOW (ref 4.22–5.81)
RDW: 12.9 % (ref 11.5–15.5)
WBC: 3.5 10*3/uL — ABNORMAL LOW (ref 4.0–10.5)

## 2014-04-14 LAB — ETHANOL: Alcohol, Ethyl (B): <11 mg/dL (ref 0–11)

## 2014-04-14 LAB — RAPID URINE DRUG SCREEN, HOSP PERFORMED
AMPHETAMINES: NOT DETECTED
BARBITURATES: NOT DETECTED
Benzodiazepines: NOT DETECTED
Cocaine: NOT DETECTED
Opiates: NOT DETECTED
Tetrahydrocannabinol: NOT DETECTED

## 2014-04-14 LAB — I-STAT TROPONIN, ED: TROPONIN I, POC: 0 ng/mL (ref 0.00–0.08)

## 2014-04-14 LAB — APTT: APTT: 27 s (ref 24–37)

## 2014-04-14 LAB — PROTIME-INR
INR: 0.99 (ref 0.00–1.49)
PROTHROMBIN TIME: 13.1 s (ref 11.6–15.2)

## 2014-04-14 MED ORDER — ASPIRIN 325 MG PO TABS: 325.0000 mg | ORAL_TABLET | Freq: Every day | ORAL | Status: DC

## 2014-04-14 MED ORDER — ASPIRIN 81 MG PO CHEW: 81.0000 mg | CHEWABLE_TABLET | Freq: Once | ORAL | Status: DC

## 2014-04-14 MED ORDER — FENOFIBRATE 160 MG PO TABS
160.0000 mg | ORAL_TABLET | Freq: Every day | ORAL | Status: DC
  Administered 2014-04-14 – 2014-04-16 (×3): 160 mg via ORAL
  Filled 2014-04-14 (×3): qty 1

## 2014-04-14 MED ORDER — INSULIN ASPART 100 UNIT/ML ~~LOC~~ SOLN: 0.0000 [IU] | Freq: Every day | SUBCUTANEOUS | Status: DC

## 2014-04-14 MED ORDER — ATORVASTATIN CALCIUM 40 MG PO TABS
40.0000 mg | ORAL_TABLET | Freq: Every day | ORAL | Status: DC
  Administered 2014-04-15: 40 mg via ORAL
  Filled 2014-04-14: qty 1

## 2014-04-14 MED ORDER — INSULIN ASPART 100 UNIT/ML ~~LOC~~ SOLN
0.0000 [IU] | Freq: Three times a day (TID) | SUBCUTANEOUS | Status: DC
  Administered 2014-04-15 – 2014-04-16 (×2): 2 [IU] via SUBCUTANEOUS

## 2014-04-14 MED ORDER — ASPIRIN 325 MG PO TABS
325.0000 mg | ORAL_TABLET | Freq: Once | ORAL | Status: AC
  Administered 2014-04-14: 325 mg via ORAL
  Filled 2014-04-14: qty 1

## 2014-04-14 MED ORDER — ZOLPIDEM TARTRATE 5 MG PO TABS
10.0000 mg | ORAL_TABLET | Freq: Every evening | ORAL | Status: DC | PRN
  Administered 2014-04-14 – 2014-04-15 (×2): 10 mg via ORAL
  Filled 2014-04-14 (×2): qty 2

## 2014-04-14 MED ORDER — SENNOSIDES-DOCUSATE SODIUM 8.6-50 MG PO TABS: 1.0000 | ORAL_TABLET | Freq: Every evening | ORAL | Status: DC | PRN

## 2014-04-14 MED ORDER — ENOXAPARIN SODIUM 40 MG/0.4ML ~~LOC~~ SOLN
40.0000 mg | SUBCUTANEOUS | Status: DC
  Administered 2014-04-14 – 2014-04-15 (×2): 40 mg via SUBCUTANEOUS
  Filled 2014-04-14 (×2): qty 0.4

## 2014-04-14 MED ORDER — STROKE: EARLY STAGES OF RECOVERY BOOK
Freq: Once | Status: AC
  Administered 2014-04-14: 17:00:00
  Filled 2014-04-14: qty 1

## 2014-04-14 MED ORDER — SODIUM CHLORIDE 0.9 % IV BOLUS (SEPSIS)
1000.0000 mL | Freq: Once | INTRAVENOUS | Status: AC
  Administered 2014-04-14: 1000 mL via INTRAVENOUS

## 2014-04-14 NOTE — Progress Notes (Signed)
Patient arrived from [facility] via Biomedical scientist. Safety precautions and orders reviewed with patient. Hospitalist aware of his arrival. Call light with reach. Alarm activated. Bed on lowest position. TELE applied box 32. Will continue to monitor.  Ave Filter, RN

## 2014-04-14 NOTE — ED Notes (Addendum)
Attempted to call x3 for report to [place].

## 2014-04-14 NOTE — Consult Note (Signed)
Referring Physician: Dirk Dress    Chief Complaint: left hemiparesis, stroke on CT brain  HPI:                                                                                                                                         Jesse Avila is an 63 y.o. male, left handed, with a past medical history significant for HTN, hyperlipidemia, DM type II, PVD, gout, DJD, transferred to [facility] for further stroke evaluation and management. Patient initially presented at [facility] with complains of left leg weakness follow by poor motor control of the left arm that started this past Wednesday. In addition, he complains of the left arm " feeling unusual" but denies HA, vertigo, double vision, difficulty swallowing, slurred speech, language or vision impairment. Jesse Avila indicated that he couldn't walk normal last Wednesday and was dragging the left leg. He said that the weakness got better the next day and for that reason he did not seek immediate medical attention.He spoke with his PCP and was advised to come to the ED CT brain with findings consistent with an acute to subacute infarction right posterior frontal lobe. Takes aspirin daily. Date last known well: 04/12/14  Time last known well: uncertain tPA Given: no, out of the window   Past Medical History  Diagnosis Date  . Hyperlipidemia   . Peripheral vascular disease     With moderate Claudication  . Hypertension   . Diabetes mellitus 8/20012    Type II  . GERD (gastroesophageal reflux disease)   . Arthritis     Gout  . ED (erectile dysfunction)   . 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 Joint replacement  11/2009    Right knee arthroscopy  . Meniscus repair  11/2009    Family History  Problem Relation Age of Onset  . Diabetes Mother   . Hypertension Mother   . Heart disease Mother     Coronary Artery Bypass Graft  . Hyperlipidemia Mother   . Heart attack Mother   .  Hypertension Father   . Hyperlipidemia Sister   . Stroke Sister    Social History:  reports that he has quit smoking. His smoking use included Cigarettes. He has a 30 pack-year smoking history. He has never used smokeless tobacco. He reports that he drinks about 7.2 ounces of alcohol per week. He reports that he does not use illicit drugs.  Allergies:  Allergies  Allergen Reactions  . Cialis [Tadalafil] Other (See Comments)    Headache    Medications:  Scheduled: .  stroke: mapping our early stages of recovery book   Does not apply Once  . [START ON 04/15/2014] aspirin  325 mg Oral Daily  . [START ON 04/15/2014] atorvastatin  40 mg Oral q1800  . enoxaparin (LOVENOX) injection  40 mg Subcutaneous Q24H  . fenofibrate  160 mg Oral Daily  . [START ON 04/15/2014] insulin aspart  0-15 Units Subcutaneous TID WC  . insulin aspart  0-5 Units Subcutaneous QHS    ROS:                                                                                                                                       History obtained from the patient, wife, and chart review.  General ROS: negative for - chills, fatigue, fever, night sweats, or weight loss Psychological ROS: negative for - behavioral disorder, hallucinations, memory difficulties, mood swings or suicidal ideation Ophthalmic ROS: negative for - blurry vision, double vision, eye pain or loss of vision ENT ROS: negative for - epistaxis, nasal discharge, oral lesions, sore throat, tinnitus or vertigo Allergy and Immunology ROS: negative for - hives or itchy/watery eyes Hematological and Lymphatic ROS: negative for - bleeding problems, bruising or swollen lymph nodes Endocrine ROS: negative for - galactorrhea, hair pattern changes, polydipsia/polyuria or temperature intolerance Respiratory ROS: negative for - cough, hemoptysis,  shortness of breath or wheezing Cardiovascular ROS: negative for - chest pain, dyspnea on exertion, edema or irregular heartbeat Gastrointestinal ROS: negative for - abdominal pain, diarrhea, hematemesis, nausea/vomiting or stool incontinence Genito-Urinary ROS: negative for - dysuria, hematuria, incontinence or urinary frequency/urgency Musculoskeletal ROS: negative for - joint swelling Neurological ROS: as noted in HPI Dermatological ROS: negative for rash and skin lesion changes  Physical exam: pleasant male in no apparent distress. Blood pressure 148/80, pulse 76, temperature 99.1 F (37.3 C), temperature source Oral, resp. rate 18, height 6' (1.829 m), weight 97.523 kg (215 lb), SpO2 96.00%. Head: normocephalic. Neck: supple, no bruits, no JVD. Cardiac: no murmurs. Lungs: clear. Abdomen: soft, no tender, no mass. Extremities: no edema. Neurologic Examination:                                                                                                      General: Mental Status: Alert, oriented, thought content appropriate.  Speech fluent without evidence of aphasia.  Able to follow 3 step commands without difficulty. Cranial Nerves: II: Discs flat bilaterally; Visual fields grossly normal, pupils equal, round, reactive to light and accommodation III,IV,  VI: ptosis not present, extra-ocular motions intact bilaterally V,VII: smile symmetric, facial light touch sensation normal bilaterally VIII: hearing normal bilaterally IX,X: gag reflex present XI: bilateral shoulder shrug XII: midline tongue extension without atrophy or fasciculations Motor: Right : Upper extremity   5/5    Left:     Upper extremity   5/5  Lower extremity   4+/5     Lower extremity   5/5 Tone and bulk:normal tone throughout; no atrophy noted Sensory: Pinprick and light touch intact throughout, bilaterally Deep Tendon Reflexes:  Right: Upper Extremity   Left: Upper extremity   biceps (C-5 to C-6) 2/4    biceps (C-5 to C-6) 2/4 tricep (C7) 2/4    triceps (C7) 2/4 Brachioradialis (C6) 2/4  Brachioradialis (C6) 2/4  Lower Extremity Lower Extremity  quadriceps (L-2 to L-4) 2/4   quadriceps (L-2 to L-4) 2/4 Achilles (S1) 2/4   Achilles (S1) 2/4 Plantars: Right: downgoing   Left: downgoing Cerebellar: normal finger-to-nose,  normal heel-to-shin test Gait:  No tested    Results for orders placed during the hospital encounter of 04/14/14 (from the past 48 hour(s))  ETHANOL     Status: None   Collection Time    04/14/14 11:27 AM      Result Value Ref Range   Alcohol, Ethyl (B) <11  0 - 11 mg/dL   Comment:            LOWEST DETECTABLE LIMIT FOR     SERUM ALCOHOL IS 11 mg/dL     FOR MEDICAL PURPOSES ONLY  PROTIME-INR     Status: None   Collection Time    04/14/14 11:27 AM      Result Value Ref Range   Prothrombin Time 13.1  11.6 - 15.2 seconds   INR 0.99  0.00 - 1.49  APTT     Status: None   Collection Time    04/14/14 11:27 AM      Result Value Ref Range   aPTT 27  24 - 37 seconds  CBC     Status: Abnormal   Collection Time    04/14/14 11:27 AM      Result Value Ref Range   WBC 3.5 (*) 4.0 - 10.5 K/uL   RBC 3.91 (*) 4.22 - 5.81 MIL/uL   Hemoglobin 12.7 (*) 13.0 - 17.0 g/dL   HCT 36.5 (*) 39.0 - 52.0 %   MCV 93.4  78.0 - 100.0 fL   MCH 32.5  26.0 - 34.0 pg   MCHC 34.8  30.0 - 36.0 g/dL   RDW 12.9  11.5 - 15.5 %   Platelets 169  150 - 400 K/uL  DIFFERENTIAL     Status: Abnormal   Collection Time    04/14/14 11:27 AM      Result Value Ref Range   Neutrophils Relative % 48  43 - 77 %   Neutro Abs 1.6 (*) 1.7 - 7.7 K/uL   Lymphocytes Relative 37  12 - 46 %   Lymphs Abs 1.3  0.7 - 4.0 K/uL   Monocytes Relative 8  3 - 12 %   Monocytes Absolute 0.3  0.1 - 1.0 K/uL   Eosinophils Relative 8 (*) 0 - 5 %   Eosinophils Absolute 0.3  0.0 - 0.7 K/uL   Basophils Relative 1  0 - 1 %   Basophils Absolute 0.0  0.0 - 0.1 K/uL  COMPREHENSIVE METABOLIC PANEL     Status: Abnormal    Collection Time  04/14/14 11:27 AM      Result Value Ref Range   Sodium 138  137 - 147 mEq/L   Potassium 4.2  3.7 - 5.3 mEq/L   Chloride 101  96 - 112 mEq/L   CO2 23  19 - 32 mEq/L   Glucose, Bld 161 (*) 70 - 99 mg/dL   BUN 16  6 - 23 mg/dL   Creatinine, Ser 0.92  0.50 - 1.35 mg/dL   Calcium 9.8  8.4 - 10.5 mg/dL   Total Protein 7.7  6.0 - 8.3 g/dL   Albumin 4.5  3.5 - 5.2 g/dL   AST 24  0 - 37 U/L   ALT 22  0 - 53 U/L   Alkaline Phosphatase 54  39 - 117 U/L   Total Bilirubin 0.4  0.3 - 1.2 mg/dL   GFR calc non Af Amer 90 (*) >90 mL/min   GFR calc Af Amer >90  >90 mL/min   Comment: (NOTE)     The eGFR has been calculated using the CKD EPI equation.     This calculation has not been validated in all clinical situations.     eGFR's persistently <90 mL/min signify possible Chronic Kidney     Disease.   Anion gap 14  5 - 15  I-STAT TROPOININ, ED     Status: None   Collection Time    04/14/14 11:41 AM      Result Value Ref Range   Troponin i, poc 0.00  0.00 - 0.08 ng/mL   Comment 3            Comment: Due to the release kinetics of cTnI,     a negative result within the first hours     of the onset of symptoms does not rule out     myocardial infarction with certainty.     If myocardial infarction is still suspected,     repeat the test at appropriate intervals.  I-STAT CHEM 8, ED     Status: Abnormal   Collection Time    04/14/14 11:47 AM      Result Value Ref Range   Sodium 139  137 - 147 mEq/L   Potassium 4.1  3.7 - 5.3 mEq/L   Chloride 107  96 - 112 mEq/L   BUN 15  6 - 23 mg/dL   Creatinine, Ser 1.00  0.50 - 1.35 mg/dL   Glucose, Bld 164 (*) 70 - 99 mg/dL   Calcium, Ion 1.23  1.13 - 1.30 mmol/L   TCO2 24  0 - 100 mmol/L   Hemoglobin 12.6 (*) 13.0 - 17.0 g/dL   HCT 37.0 (*) 39.0 - 52.0 %  URINE RAPID DRUG SCREEN (HOSP PERFORMED)     Status: None   Collection Time    04/14/14  4:07 PM      Result Value Ref Range   Opiates NONE DETECTED  NONE DETECTED   Cocaine  NONE DETECTED  NONE DETECTED   Benzodiazepines NONE DETECTED  NONE DETECTED   Amphetamines NONE DETECTED  NONE DETECTED   Tetrahydrocannabinol NONE DETECTED  NONE DETECTED   Barbiturates NONE DETECTED  NONE DETECTED   Comment:            DRUG SCREEN FOR MEDICAL PURPOSES     ONLY.  IF CONFIRMATION IS NEEDED     FOR ANY PURPOSE, NOTIFY LAB     WITHIN 5 DAYS.  LOWEST DETECTABLE LIMITS     FOR URINE DRUG SCREEN     Drug Class       Cutoff (ng/mL)     Amphetamine      1000     Barbiturate      200     Benzodiazepine   557     Tricyclics       322     Opiates          300     Cocaine          300     THC              50  URINALYSIS, ROUTINE W REFLEX MICROSCOPIC     Status: Abnormal   Collection Time    04/14/14  4:07 PM      Result Value Ref Range   Color, Urine YELLOW  YELLOW   APPearance CLEAR  CLEAR   Specific Gravity, Urine 1.013  1.005 - 1.030   pH 6.5  5.0 - 8.0   Glucose, UA 100 (*) NEGATIVE mg/dL   Hgb urine dipstick NEGATIVE  NEGATIVE   Bilirubin Urine NEGATIVE  NEGATIVE   Ketones, ur NEGATIVE  NEGATIVE mg/dL   Protein, ur NEGATIVE  NEGATIVE mg/dL   Urobilinogen, UA 0.2  0.0 - 1.0 mg/dL   Nitrite NEGATIVE  NEGATIVE   Leukocytes, UA NEGATIVE  NEGATIVE   Comment: MICROSCOPIC NOT DONE ON URINES WITH NEGATIVE PROTEIN, BLOOD, LEUKOCYTES, NITRITE, OR GLUCOSE <1000 mg/dL.   Ct Head Wo Contrast  04/14/2014   CLINICAL DATA:  Left-sided weakness begin on Wednesday at 14:30 hr.  EXAM: CT HEAD WITHOUT CONTRAST  TECHNIQUE: Contiguous axial images were obtained from the base of the skull through the vertex without intravenous contrast.  COMPARISON:  None.  FINDINGS: There is a band of hyperdensity in the posterior right frontal lobe on images 15 through 17. There are no prior head CTs for comparison. No evidence of mass effect on the adjacent right lateral ventricle and no definite mass effect on the adjacent sulci. Ventricles normal in size. Negative for midline shift.  Negative for intra or extra-axial hemorrhage. Bilateral physiologic basal ganglia calcifications noted.  Mucosal thickening and fluid is seen in multiple ethmoid air cells. The remainder of the visualized portions of the paranasal sinuses are clear. The mastoid air cells are clear. The skull is intact. Soft tissues of the scalp and orbits are symmetric.  IMPRESSION: Band of hypoattenuation in the posterior right frontal lobe is consistent with ischemia. Given the CT appearances and the patient's left upper and lower extremity weakness which occurred 2 days ago, this is favored to reflect an acute to subacute infarction. Critical Value/emergent results were called by telephone at the time of interpretation on 04/14/2014 at 11:55 am to Dr. Jamse Mead , who verbally acknowledged these results.   Electronically Signed   By: Curlene Dolphin M.D.   On: 04/14/2014 12:00    Assessment: 63 y.o. male with left hemiparesis (remarkably improved) since 04/12/14 and CT brain with findings suggestive of an acute/subacute right posterior frontal infarct. Out of the window for thrombolysis. Stroke work up underway. Aspirin pending results stroke testing. Stroke team will follow up tomorrow.   Stroke Risk Factors - HTN, hyperlipidemia, DM   Plan: 1. HgbA1c, fasting lipid panel 2. MRI, MRA  of the brain without contrast 3. Echocardiogram 4. Carotid dopplers 5. Prophylactic therapy-aspirin 6. Risk factor modification 7. Telemetry monitoring 8. Frequent neuro checks 9.  PT/OT SLP  Dorian Pod, MD [facility] [phone]  04/14/2014, 6:08 PM

## 2014-04-14 NOTE — ED Notes (Signed)
[facility] called for transport.

## 2014-04-14 NOTE — H&P (Addendum)
[facility] History and Physical  Jesse Avila FIE:[id number] DOB: 07-03-1951 DOA: 04/14/2014  Referring physician: ED PCP: Simona Huh, MD   Chief Complaint:  Left-sided weakness since 2 days  HPI:  63 year old obese left side dominant male with a history of hypertension, peripheral vascular disease, diabetes mellitus and hyperlipidemia who presented to the ED weight today history of left-sided weakness. He reports having acute onset of weakness involving both his left upper and lower limb with decreased sensation and unsteady gait .  The symptoms slowly improved however he still had difficulty writing and holding objects in his left hand. He denies similar symptoms in the past. Denies loss of consciousness. He spoke with his PCP and was advised to come to the ED. Patient denies headache, dizziness, fever, chills, nausea , vomiting, chest pain, palpitations, SOB, abdominal pain, bowel or urinary symptoms. Denies change in weight or appetite.  Course in the ED Patient's vitals were stable. Blood work done showed WBC of 3.5, hemoglobin of 12.7 , platelets 169. Chemistry was normal. head CT done in the ED showed band of hypoattenuation in the posterior right frontal lobe consistent with ischemia. Neuro hospitalist consulted and recommended transfer to [facility] cone for. Workup. Hospitalist admission requested.     Review of Systems:  Constitutional: Denies fever, chills, diaphoresis, appetite change and fatigue.  HEENT: Denies photophobia, eye pain,  hearing loss,  trouble swallowing, neck pain,  tinnitus.   Respiratory: Denies SOB, DOE, cough, chest tightness,  and wheezing.   Cardiovascular: Denies chest pain, palpitations and leg swelling.  Gastrointestinal: Denies nausea, vomiting, abdominal pain, diarrhea, constipation, blood in stool and abdominal distention.  Genitourinary: Denies dysuria, urgency, frequency, hematuria, flank pain and difficulty urinating.  Endocrine:  Denies: hot or cold intolerance,  polyuria, polydipsia. Musculoskeletal: Denies myalgias, back pain, joint swelling, arthralgias   Skin: Denies pallor, rash and wound.  Neurological: Left-sided weakness and numbness, unsteady gait Denies dizziness, seizures, syncope,  light-headedness, and headaches.  Psychiatric/Behavioral: Denies confusion,   Past Medical History  Diagnosis Date  . Hyperlipidemia   . Peripheral vascular disease     With moderate Claudication  . Hypertension   . Diabetes mellitus 8/20012    Type II  . GERD (gastroesophageal reflux disease)   . Arthritis     Gout  . ED (erectile dysfunction)   . 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 Joint replacement  11/2009    Right knee arthroscopy  . Meniscus repair  11/2009   Social History:  reports that he has quit smoking. His smoking use included Cigarettes. He has a 30 pack-year smoking history. He has never used smokeless tobacco. He reports that he drinks about 7.2 ounces of alcohol per week. He reports that he does not use illicit drugs.  Allergies  Allergen Reactions  . Cialis [Tadalafil] Other (See Comments)    Headache    Family History  Problem Relation Age of Onset  . Diabetes Mother   . Hypertension Mother   . Heart disease Mother     Coronary Artery Bypass Graft  . Hyperlipidemia Mother   . Heart attack Mother   . Hypertension Father   . Hyperlipidemia Sister   . Stroke Sister     Prior to Admission medications   Medication Sig Start Date End Date Taking? Authorizing Provider  aspirin 81 MG tablet Take 81 mg by mouth daily.   Yes Historical Provider, MD  fenofibrate 160 MG tablet Take 160 mg by mouth daily.   Yes Historical Provider, MD  glimepiride (AMARYL) 1 MG tablet Take 1 mg by mouth daily with breakfast.  03/29/14  Yes Historical Provider, MD  metFORMIN (GLUCOPHAGE) 500 MG tablet Take 500 mg by mouth 3 (three) times  daily.    Yes Historical Provider, MD  olmesartan (BENICAR) 40 MG tablet Take 20 mg by mouth daily.    Yes Historical Provider, MD  rosuvastatin (CRESTOR) 40 MG tablet Take 20 mg by mouth daily. For cholesterol   Yes Historical Provider, MD  zolpidem (AMBIEN) 10 MG tablet Take 1 tablet (10 mg total) by mouth at bedtime as needed for sleep. 10/18/13 04/14/14 Yes Mancel Bale, PA-C     Physical Exam:  Filed Vitals:   04/14/14 1123 04/14/14 1133 04/14/14 1148  BP: 126/79    Pulse: 88    Temp:  98.3 F (36.8 C) 98.3 F (36.8 C)  TempSrc:  Oral   Resp: 18    Height:  6' (1.829 m)   Weight:  97.523 kg (215 lb)   SpO2: 93%      Constitutional: Vital signs reviewed.  Middle aged obese male in no acute distress HEENT: no pallor, no icterus, moist oral mucosa,  Cardiovascular: RRR, S1 normal, S2 normal, no MRG Chest: CTAB, no wheezes, rales, or rhonchi Abdominal: Soft. Non-tender, non-distended, bowel sounds are normal,  Ext: warm, no edema Neurological: A&O x3, today and asked to-12 intact, normal motor power, tone and reflexes bilaterally, diminished sensation over bilateral left upper and lower legs, cerebellar function intact. Gait not assessed  Labs on Admission:  Basic Metabolic Panel:  Recent Labs Lab 04/14/14 1127 04/14/14 1147  NA 138 139  K 4.2 4.1  CL 101 107  CO2 23  --   GLUCOSE 161* 164*  BUN 16 15  CREATININE 0.92 1.00  CALCIUM 9.8  --    Liver Function Tests:  Recent Labs Lab 04/14/14 1127  AST 24  ALT 22  ALKPHOS 54  BILITOT 0.4  PROT 7.7  ALBUMIN 4.5   No results found for this basename: LIPASE, AMYLASE,  in the last 168 hours No results found for this basename: AMMONIA,  in the last 168 hours CBC:  Recent Labs Lab 04/14/14 1127 04/14/14 1147  WBC 3.5*  --   NEUTROABS 1.6*  --   HGB 12.7* 12.6*  HCT 36.5* 37.0*  MCV 93.4  --   PLT 169  --    Cardiac Enzymes: No results found for this basename: CKTOTAL, CKMB, CKMBINDEX, TROPONINI,  in  the last 168 hours BNP: No components found with this basename: POCBNP,  CBG: No results found for this basename: GLUCAP,  in the last 168 hours  Radiological Exams on Admission: Ct Head Wo Contrast  04/14/2014   CLINICAL DATA:  Left-sided weakness begin on Wednesday at 14:30 hr.  EXAM: CT HEAD WITHOUT CONTRAST  TECHNIQUE: Contiguous axial images were obtained from the base of the skull through the vertex without intravenous contrast.  COMPARISON:  None.  FINDINGS: There is a band of hyperdensity in the posterior right frontal lobe on images 15 through 17. There are no prior head CTs for comparison. No evidence of mass effect on the adjacent right lateral ventricle and no definite mass effect on the adjacent sulci. Ventricles normal in size. Negative for midline shift. Negative for intra or extra-axial hemorrhage. Bilateral physiologic basal ganglia calcifications noted.  Mucosal thickening and fluid is seen in  multiple ethmoid air cells. The remainder of the visualized portions of the paranasal sinuses are clear. The mastoid air cells are clear. The skull is intact. Soft tissues of the scalp and orbits are symmetric.  IMPRESSION: Band of hypoattenuation in the posterior right frontal lobe is consistent with ischemia. Given the CT appearances and the patient's left upper and lower extremity weakness which occurred 2 days ago, this is favored to reflect an acute to subacute infarction. Critical Value/emergent results were called by telephone at the time of interpretation on 04/14/2014 at 11:55 am to Dr. Jamse Mead , who verbally acknowledged these results.   Electronically Signed   By: Curlene Dolphin M.D.   On: 04/14/2014 12:00    EKG: NSR with PVCs, no ST-T changes  Assessment/Plan  Principal Problem:   Acute ischemic stroke Transferred to [facility] telemetry floor for complete stroke Workup. -Neurochecks every 4 hours. Head CT on admission suggestive of subacute right sided infarct. For stroke  workup including MRI brain, MRA head, 2-D echo, carotid Doppler. -Clear swallow eval at bedside. -PT/OT eval. - patient on baby aspirin at home will place him on full dose aspirin. Resume home cholesterol medications. Check A1c and lipid panel in a.m. -New hospitalist will consult once patient arrives to the floor.   Active Problems:    GERD (gastroesophageal reflux disease) Continue PPI    Hypertension Hold home blood pressure medications and allow permissive hypertension   Type 2 diabetes mellitus Hold oral hypoglycemics. Monitor on sliding-scale insulin. Check A1c  Obesity Counseled on weight reduction and exercise     Diet:cardiac/ diabetic  DVT prophylaxis: sq lovenox   Code Status: full code Family Communication: None at bedside Disposition Plan: Transfer to [facility] Laws, Flonnie Overman [facility] Pager [phone]  Total time spent on admission :70 minutes  If 7PM-7AM, please contact night-coverage [email or web address] Password [email or web address] 04/14/2014, 1:26 PM

## 2014-04-14 NOTE — ED Provider Notes (Signed)
CSN: [id number]     Arrival date & time 04/14/14  1105 History   First MD Initiated Contact with Patient 04/14/14 1110     Chief Complaint  Patient presents with  . Extremity Weakness    left side  . Altered Mental Status     (Consider location/radiation/quality/duration/timing/severity/associated sxs/prior Treatment) The history is provided by the patient. No language interpreter was used.  Jesse Avila is a 63 year old male with past medical history of hyperlipidemia, peripheral vascular disease, congestive heart failure, hypertension, diabetes, COPD presenting to the ED with stroke like symptoms that started on Wednesday afternoon. Patient reported that on Wednesday when he was at work it difficulty writing on his left side-patient is left hand dominant. Reported that the pain continues he fell out of his hand when he was attempting to right. Reported that he was having weakness in his left leg. Wife who accompanies patient, reported that patient was having an awkward gait - stated that he was walking as if he was "drunk." Stated that he still continues to have mild weakness in his left arm and left leg. As per wife, reported that on the way here he felt mildly confused - stated that he was unaware as to where he was when driving over to the ED. Stated that he spoke with his primary doctor today who recommended patient to come to the emergency department to be assessed. Stated that he has taken his ASA today. Denied syncope, blurred vision, sudden loss of vision, chest pain, shortness of breath, difficulty breathing, dizziness, headache, numbness, tingling, loss of sensation, slurred speech or changes to speech pattern. PCP Dr. Marisue Humble  Past Medical History  Diagnosis Date  . Hyperlipidemia   . Peripheral vascular disease     With moderate Claudication  . Hypertension   . Diabetes mellitus 8/20012    Type II  . GERD (gastroesophageal reflux disease)   . Arthritis     Gout  . ED  (erectile dysfunction)   . 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 Joint replacement  11/2009    Right knee arthroscopy  . Meniscus repair  11/2009   Family History  Problem Relation Age of Onset  . Diabetes Mother   . Hypertension Mother   . Heart disease Mother     Coronary Artery Bypass Graft  . Hyperlipidemia Mother   . Heart attack Mother   . Hypertension Father   . Hyperlipidemia Sister   . Stroke Sister    History  Substance Use Topics  . Smoking status: Former Smoker -- 1.00 packs/day for 30 years    Types: Cigarettes  . Smokeless tobacco: Never Used  . Alcohol Use: 7.2 oz/week    12 Cans of beer per week     Comment: WEEKENDS AND DURING WEEK    Review of Systems  Respiratory: Negative for chest tightness and shortness of breath.   Cardiovascular: Negative for chest pain.  Gastrointestinal: Negative for nausea, vomiting and abdominal pain.  Musculoskeletal: Negative for back pain and neck pain.  Neurological: Positive for weakness (Left-sided). Negative for dizziness and headaches.  Psychiatric/Behavioral: Positive for confusion.      Allergies  Cialis  Home Medications   Prior to Admission medications   Medication Sig Start Date End Date Taking? Authorizing Provider  aspirin 81 MG tablet Take 81 mg by mouth daily.   Yes Historical Provider, MD  fenofibrate 160 MG  tablet Take 160 mg by mouth daily.   Yes Historical Provider, MD  glimepiride (AMARYL) 1 MG tablet Take 1 mg by mouth daily with breakfast.  03/29/14  Yes Historical Provider, MD  metFORMIN (GLUCOPHAGE) 500 MG tablet Take 500 mg by mouth 3 (three) times daily.    Yes Historical Provider, MD  olmesartan (BENICAR) 40 MG tablet Take 20 mg by mouth daily.    Yes Historical Provider, MD  rosuvastatin (CRESTOR) 40 MG tablet Take 20 mg by mouth daily. For cholesterol   Yes Historical Provider, MD  zolpidem (AMBIEN) 10 MG tablet Take 1  tablet (10 mg total) by mouth at bedtime as needed for sleep. 10/18/13 04/14/14 Yes Sarah Alleen Borne, PA-C   BP 146/74  Pulse 76  Temp(Src) 98.3 F (36.8 C) (Oral)  Resp 20  Ht 6' (1.829 m)  Wt 215 lb (97.523 kg)  BMI 29.15 kg/m2  SpO2 94% Physical Exam  Nursing note and vitals reviewed. Constitutional: He is oriented to person, place, and time. He appears well-developed and well-nourished. No distress.  HENT:  Head: Normocephalic and atraumatic.  Mouth/Throat: Oropharynx is clear and moist. No oropharyngeal exudate.  Eyes: Conjunctivae and EOM are normal. Pupils are equal, round, and reactive to light. Right eye exhibits no discharge. Left eye exhibits no discharge.  Neck: Normal range of motion. Neck supple. No tracheal deviation present.  Negative neck stiffness Full range of motion noted Negative pain upon palpation to the C-spine  Cardiovascular: Normal rate, regular rhythm and normal heart sounds.  Exam reveals no friction rub.   No murmur heard. Pulses:      Radial pulses are 2+ on the right side, and 2+ on the left side.       Dorsalis pedis pulses are 2+ on the right side, and 2+ on the left side.  Pulmonary/Chest: Effort normal and breath sounds normal. No respiratory distress. He has no wheezes. He has no rales. He exhibits no tenderness.  Musculoskeletal: Normal range of motion.  Full ROM to upper and lower extremities without difficulty noted, negative ataxia noted.  Lymphadenopathy:    He has no cervical adenopathy.  Neurological: He is alert and oriented to person, place, and time. No cranial nerve deficit. He exhibits normal muscle tone. Coordination normal.  Cranial nerves III-XII grossly intact Strength 5+/5+ to upper and lower extremities bilaterally with resistance applied, equal distribution noted Equal grip strength bilaterally Sensation intact Negative facial droop Negative slurred speech Negative aphasia Negative arm drift Fine motor skills intact Patient  able to bring finger to nose bilaterally without difficulty or ataxia Patient follows commands well Patient answers questions appropriately  Skin: Skin is warm and dry. No rash noted. He is not diaphoretic. No erythema.  Psychiatric: He has a normal mood and affect. His behavior is normal. Thought content normal.    ED Course  Procedures (including critical care time)  12:25 PM This provider spoke with PA on Neuro. Discussed case, labs, imaging, physical exam. Recommended patient to be transferred to [facility].   12:36 PM This provider Dr. Clementeen Graham, [facility] - discussed case, labs, imaging, vitals, imaging in great detail. Patient to be admitted to Telemetry and transferred to [facility].   Results for orders placed during the hospital encounter of 04/14/14  ETHANOL      Result Value Ref Range   Alcohol, Ethyl (B) <11  0 - 11 mg/dL  PROTIME-INR      Result Value Ref Range   Prothrombin Time 13.1  11.6 - 15.2 seconds   INR 0.99  0.00 - 1.49  APTT      Result Value Ref Range   aPTT 27  24 - 37 seconds  CBC      Result Value Ref Range   WBC 3.5 (*) 4.0 - 10.5 K/uL   RBC 3.91 (*) 4.22 - 5.81 MIL/uL   Hemoglobin 12.7 (*) 13.0 - 17.0 g/dL   HCT 36.5 (*) 39.0 - 52.0 %   MCV 93.4  78.0 - 100.0 fL   MCH 32.5  26.0 - 34.0 pg   MCHC 34.8  30.0 - 36.0 g/dL   RDW 12.9  11.5 - 15.5 %   Platelets 169  150 - 400 K/uL  DIFFERENTIAL      Result Value Ref Range   Neutrophils Relative % 48  43 - 77 %   Neutro Abs 1.6 (*) 1.7 - 7.7 K/uL   Lymphocytes Relative 37  12 - 46 %   Lymphs Abs 1.3  0.7 - 4.0 K/uL   Monocytes Relative 8  3 - 12 %   Monocytes Absolute 0.3  0.1 - 1.0 K/uL   Eosinophils Relative 8 (*) 0 - 5 %   Eosinophils Absolute 0.3  0.0 - 0.7 K/uL   Basophils Relative 1  0 - 1 %   Basophils Absolute 0.0  0.0 - 0.1 K/uL  COMPREHENSIVE METABOLIC PANEL      Result Value Ref Range   Sodium 138  137 - 147 mEq/L   Potassium 4.2  3.7 - 5.3 mEq/L   Chloride 101  96 - 112  mEq/L   CO2 23  19 - 32 mEq/L   Glucose, Bld 161 (*) 70 - 99 mg/dL   BUN 16  6 - 23 mg/dL   Creatinine, Ser 0.92  0.50 - 1.35 mg/dL   Calcium 9.8  8.4 - 10.5 mg/dL   Total Protein 7.7  6.0 - 8.3 g/dL   Albumin 4.5  3.5 - 5.2 g/dL   AST 24  0 - 37 U/L   ALT 22  0 - 53 U/L   Alkaline Phosphatase 54  39 - 117 U/L   Total Bilirubin 0.4  0.3 - 1.2 mg/dL   GFR calc non Af Amer 90 (*) >90 mL/min   GFR calc Af Amer >90  >90 mL/min   Anion gap 14  5 - 15  URINE RAPID DRUG SCREEN (HOSP PERFORMED)      Result Value Ref Range   Opiates NONE DETECTED  NONE DETECTED   Cocaine NONE DETECTED  NONE DETECTED   Benzodiazepines NONE DETECTED  NONE DETECTED   Amphetamines NONE DETECTED  NONE DETECTED   Tetrahydrocannabinol NONE DETECTED  NONE DETECTED   Barbiturates NONE DETECTED  NONE DETECTED  URINALYSIS, ROUTINE W REFLEX MICROSCOPIC      Result Value Ref Range   Color, Urine YELLOW  YELLOW   APPearance CLEAR  CLEAR   Specific Gravity, Urine 1.013  1.005 - 1.030   pH 6.5  5.0 - 8.0   Glucose, UA 100 (*) NEGATIVE mg/dL   Hgb urine dipstick NEGATIVE  NEGATIVE   Bilirubin Urine NEGATIVE  NEGATIVE   Ketones, ur NEGATIVE  NEGATIVE mg/dL   Protein, ur NEGATIVE  NEGATIVE mg/dL   Urobilinogen, UA 0.2  0.0 - 1.0 mg/dL   Nitrite NEGATIVE  NEGATIVE   Leukocytes, UA NEGATIVE  NEGATIVE  I-STAT CHEM 8, ED      Result Value Ref Range   Sodium  139  137 - 147 mEq/L   Potassium 4.1  3.7 - 5.3 mEq/L   Chloride 107  96 - 112 mEq/L   BUN 15  6 - 23 mg/dL   Creatinine, Ser 1.00  0.50 - 1.35 mg/dL   Glucose, Bld 164 (*) 70 - 99 mg/dL   Calcium, Ion 1.23  1.13 - 1.30 mmol/L   TCO2 24  0 - 100 mmol/L   Hemoglobin 12.6 (*) 13.0 - 17.0 g/dL   HCT 37.0 (*) 39.0 - 52.0 %  I-STAT TROPOININ, ED      Result Value Ref Range   Troponin i, poc 0.00  0.00 - 0.08 ng/mL   Comment 3             Labs Review Labs Reviewed  CBC - Abnormal; Notable for the following:    WBC 3.5 (*)    RBC 3.91 (*)    Hemoglobin 12.7  (*)    HCT 36.5 (*)    All other components within normal limits  DIFFERENTIAL - Abnormal; Notable for the following:    Neutro Abs 1.6 (*)    Eosinophils Relative 8 (*)    All other components within normal limits  COMPREHENSIVE METABOLIC PANEL - Abnormal; Notable for the following:    Glucose, Bld 161 (*)    GFR calc non Af Amer 90 (*)    All other components within normal limits  URINALYSIS, ROUTINE W REFLEX MICROSCOPIC - Abnormal; Notable for the following:    Glucose, UA 100 (*)    All other components within normal limits  I-STAT CHEM 8, ED - Abnormal; Notable for the following:    Glucose, Bld 164 (*)    Hemoglobin 12.6 (*)    HCT 37.0 (*)    All other components within normal limits  ETHANOL  PROTIME-INR  APTT  URINE RAPID DRUG SCREEN (HOSP PERFORMED)  I-STAT TROPOININ, ED    Imaging Review Ct Head Wo Contrast  04/14/2014   CLINICAL DATA:  Left-sided weakness begin on Wednesday at 14:30 hr.  EXAM: CT HEAD WITHOUT CONTRAST  TECHNIQUE: Contiguous axial images were obtained from the base of the skull through the vertex without intravenous contrast.  COMPARISON:  None.  FINDINGS: There is a band of hyperdensity in the posterior right frontal lobe on images 15 through 17. There are no prior head CTs for comparison. No evidence of mass effect on the adjacent right lateral ventricle and no definite mass effect on the adjacent sulci. Ventricles normal in size. Negative for midline shift. Negative for intra or extra-axial hemorrhage. Bilateral physiologic basal ganglia calcifications noted.  Mucosal thickening and fluid is seen in multiple ethmoid air cells. The remainder of the visualized portions of the paranasal sinuses are clear. The mastoid air cells are clear. The skull is intact. Soft tissues of the scalp and orbits are symmetric.  IMPRESSION: Band of hypoattenuation in the posterior right frontal lobe is consistent with ischemia. Given the CT appearances and the patient's left  upper and lower extremity weakness which occurred 2 days ago, this is favored to reflect an acute to subacute infarction. Critical Value/emergent results were called by telephone at the time of interpretation on 04/14/2014 at 11:55 am to Dr. Jamse Mead , who verbally acknowledged these results.   Electronically Signed   By: Curlene Dolphin M.D.   On: 04/14/2014 12:00     EKG Interpretation None      MDM   Final diagnoses:  Stroke  Left hemiparesis  Medications  sodium chloride 0.9 % bolus 1,000 mL (0 mLs Intravenous Stopped 04/14/14 1621)  aspirin tablet 325 mg (325 mg Oral Given 04/14/14 1322)    Filed Vitals:   04/14/14 1123 04/14/14 1133 04/14/14 1148 04/14/14 1607  BP: 126/79   146/74  Pulse: 88   76  Temp:  98.3 F (36.8 C) 98.3 F (36.8 C)   TempSrc:  Oral    Resp: 18   20  Height:  6' (1.829 m)    Weight:  215 lb (97.523 kg)    SpO2: 93%   94%    EKG noted sinus rhythm with heart rate of 86 bpm with PVCs. I-STAT troponin negative elevation. CBC noted mildly low white blood cell count 3.5. Hemoglobin hematocrit unremarkable. Chem 8 unremarkable. CMP unremarkable. Ethanol negative elevation. INR and APTT within normal limits. Urine drug screen negative. UA negative. CT head noted band of hypoattenuation in the posterior right frontal lobe is consistent with ischemia-favored to reflect an acute to subacute infarction. This provider spoke with neurology physician assistant who recommended patient be transferred over to [facility] for further assessment and evaluation to be performed. [facility] to assess and admit patient. Discussed plan for admission with patient and wife - agreed to plan of care. Patient stable for transfer.   Jamse Mead, PA-C 04/14/14 1645

## 2014-04-14 NOTE — ED Notes (Signed)
Pt to ed co left side weakness. Pt stated weakness started on left upper and lower extremities on wednesday  At 1430. Pt denies blurred vision. No further neuro deficit noticed. Pt alert and oriented x4.

## 2014-04-15 DIAGNOSIS — K219 Gastro-esophageal reflux disease without esophagitis: Secondary | ICD-10-CM

## 2014-04-15 DIAGNOSIS — E785 Hyperlipidemia, unspecified: Secondary | ICD-10-CM

## 2014-04-15 DIAGNOSIS — I517 Cardiomegaly: Secondary | ICD-10-CM

## 2014-04-15 DIAGNOSIS — I798 Other disorders of arteries, arterioles and capillaries in diseases classified elsewhere: Secondary | ICD-10-CM

## 2014-04-15 DIAGNOSIS — I10 Essential (primary) hypertension: Secondary | ICD-10-CM

## 2014-04-15 DIAGNOSIS — I158 Other secondary hypertension: Secondary | ICD-10-CM

## 2014-04-15 DIAGNOSIS — E1159 Type 2 diabetes mellitus with other circulatory complications: Secondary | ICD-10-CM

## 2014-04-15 DIAGNOSIS — E669 Obesity, unspecified: Secondary | ICD-10-CM

## 2014-04-15 LAB — GLUCOSE, CAPILLARY
GLUCOSE-CAPILLARY: 109 mg/dL — AB (ref 70–99)
Glucose-Capillary: 99 mg/dL (ref 70–99)
Glucose-Capillary: 149 mg/dL — ABNORMAL HIGH (ref 70–99)
Glucose-Capillary: 149 mg/dL — ABNORMAL HIGH (ref 70–99)

## 2014-04-15 LAB — LIPID PANEL
CHOL/HDL RATIO: 5.1 ratio
Cholesterol: 147 mg/dL (ref 0–200)
HDL: 29 mg/dL — AB (ref >39)
LDL CALC: 66 mg/dL (ref 0–99)
Triglycerides: 261 mg/dL — ABNORMAL HIGH (ref <150)
VLDL: 52 mg/dL — AB (ref 0–40)

## 2014-04-15 LAB — HEMOGLOBIN A1C
Hgb A1c MFr Bld: 7.2 % — ABNORMAL HIGH (ref <5.7)
Mean Plasma Glucose: 160 mg/dL — ABNORMAL HIGH (ref <117)

## 2014-04-15 MED ORDER — THIAMINE HCL 100 MG/ML IJ SOLN
100.0000 mg | Freq: Every day | INTRAMUSCULAR | Status: DC
  Filled 2014-04-15: qty 2

## 2014-04-15 MED ORDER — LORAZEPAM 2 MG/ML IJ SOLN: 1.0000 mg | Freq: Four times a day (QID) | INTRAMUSCULAR | Status: DC | PRN

## 2014-04-15 MED ORDER — LORAZEPAM 1 MG PO TABS: 1.0000 mg | ORAL_TABLET | Freq: Four times a day (QID) | ORAL | Status: DC | PRN

## 2014-04-15 MED ORDER — ADULT MULTIVITAMIN W/MINERALS CH
1.0000 | ORAL_TABLET | Freq: Every day | ORAL | Status: DC
  Administered 2014-04-15 – 2014-04-16 (×2): 1 via ORAL
  Filled 2014-04-15 (×2): qty 1

## 2014-04-15 MED ORDER — VITAMIN B-1 100 MG PO TABS
100.0000 mg | ORAL_TABLET | Freq: Every day | ORAL | Status: DC
  Administered 2014-04-15 – 2014-04-16 (×2): 100 mg via ORAL
  Filled 2014-04-15 (×2): qty 1

## 2014-04-15 MED ORDER — CLOPIDOGREL BISULFATE 75 MG PO TABS
75.0000 mg | ORAL_TABLET | Freq: Every day | ORAL | Status: DC
  Administered 2014-04-15 – 2014-04-16 (×2): 75 mg via ORAL
  Filled 2014-04-15 (×2): qty 1

## 2014-04-15 MED ORDER — FOLIC ACID 1 MG PO TABS
1.0000 mg | ORAL_TABLET | Freq: Every day | ORAL | Status: DC
  Administered 2014-04-15 – 2014-04-16 (×2): 1 mg via ORAL
  Filled 2014-04-15 (×2): qty 1

## 2014-04-15 NOTE — Progress Notes (Signed)
STROKE TEAM PROGRESS NOTE   HISTORY Jesse Avila is an 63 y.o. male, left handed, with a past medical history significant for HTN, hyperlipidemia, DM type II, PVD, gout, DJD, transferred to [facility] for further stroke evaluation and management.  Patient initially presented at [facility] with complains of left leg weakness follow by poor motor control of the left arm that started this past Wednesday. In addition, he complains of the left arm " feeling unusual" but denies HA, vertigo, double vision, difficulty swallowing, slurred speech, language or vision impairment.  Jesse Avila indicated that he couldn't walk normal last Wednesday and was dragging the left leg. He said that the weakness got better the next day and for that reason he did not seek immediate medical attention.He spoke with his PCP and was advised to come to the ED  CT brain with findings consistent with an acute to subacute infarction right posterior frontal lobe.  Takes aspirin daily.  Date last known well: 04/12/14  Time last known well: uncertain  tPA Given: no, out of the window  SUBJECTIVE (INTERVAL HISTORY) His wife and daughter are at the bedside.  Overall he feels his condition is completely resolved. He is eager to go home.    OBJECTIVE Temp:  [98 F (36.7 C)-98.6 F (37 C)] 98.6 F (37 C) (09/12 2230) Pulse Rate:  [68-88] 68 (09/12 2230) Cardiac Rhythm:  [-] Normal sinus rhythm (09/12 2000) Resp:  [18-20] 18 (09/12 2230) BP: (123-149)/(69-78) 132/75 mmHg (09/12 2230) SpO2:  [96 %-97 %] 96 % (09/12 2230)   Recent Labs Lab 04/14/14 2145 04/15/14 0640 04/15/14 1145 04/15/14 1643 04/15/14 2200  GLUCAP 150* 109* 99 149* 149*    Recent Labs Lab 04/14/14 1127 04/14/14 1147  NA 138 139  K 4.2 4.1  CL 101 107  CO2 23  --   GLUCOSE 161* 164*  BUN 16 15  CREATININE 0.92 1.00  CALCIUM 9.8  --     Recent Labs Lab 04/14/14 1127  AST 24  ALT 22  ALKPHOS 54  BILITOT 0.4  PROT 7.7  ALBUMIN 4.5    Recent  Labs Lab 04/14/14 1127 04/14/14 1147  WBC 3.5*  --   NEUTROABS 1.6*  --   HGB 12.7* 12.6*  HCT 36.5* 37.0*  MCV 93.4  --   PLT 169  --    No results found for this basename: CKTOTAL, CKMB, CKMBINDEX, TROPONINI,  in the last 168 hours  Recent Labs  04/14/14 1127  LABPROT 13.1  INR 0.99    Recent Labs  04/14/14 1607  COLORURINE YELLOW  LABSPEC 1.013  PHURINE 6.5  GLUCOSEU 100*  HGBUR NEGATIVE  BILIRUBINUR NEGATIVE  KETONESUR NEGATIVE  PROTEINUR NEGATIVE  UROBILINOGEN 0.2  NITRITE NEGATIVE  LEUKOCYTESUR NEGATIVE       Component Value Date/Time   CHOL 147 04/15/2014 0550   TRIG 261* 04/15/2014 0550   HDL 29* 04/15/2014 0550   CHOLHDL 5.1 04/15/2014 0550   VLDL 52* 04/15/2014 0550   LDLCALC 66 04/15/2014 0550   Lab Results  Component Value Date   HGBA1C 7.2* 04/15/2014      Component Value Date/Time   LABOPIA NONE DETECTED 04/14/2014 1607   COCAINSCRNUR NONE DETECTED 04/14/2014 1607   LABBENZ NONE DETECTED 04/14/2014 1607   AMPHETMU NONE DETECTED 04/14/2014 1607   THCU NONE DETECTED 04/14/2014 1607   LABBARB NONE DETECTED 04/14/2014 1607     Recent Labs Lab 04/14/14 1127  ETH <11    Ct Head Wo Contrast  04/14/2014   IMPRESSION: Band of hypoattenuation in the posterior right frontal lobe is consistent with ischemia. Given the CT appearances and the patient's left upper and lower extremity weakness which occurred 2 days ago, this is favored to reflect an acute to subacute infarction.    Mri and Mra Head/brain Wo Cm  04/15/2014 IMPRESSION: MRI HEAD IMPRESSION:  1. Acute ischemic infarct within the posterior/inferior right frontal lobe as above. No significant mass effect or associated hemorrhage. 2. Remote infarcts involving the right basal ganglia and right cerebellar hemisphere. 3. Mild generalized cerebral atrophy.  MRA HEAD IMPRESSION:  1. No proximal branch occlusion or hemodynamically significant stenosis within the intracranial circulation. 2. Multi focal  atherosclerotic irregularity within the cavernous ICAs bilaterally, right greater than left. 3. Question 2-3 mm focal outpouching extending from the cavernous left ICA as above. Finding is suspicious for a possible small aneurysm.     2D echo - - Left ventricle: The cavity size was normal. Wall thickness was normal. Systolic function was normal. The estimated ejection fraction was in the range of 55% to 60%. Wall motion was normal; there were no regional wall motion abnormalities. Doppler parameters are consistent with abnormal left ventricular relaxation (grade 1 diastolic dysfunction). - Left atrium: The atrium was mildly dilated. Impressions: - No cardiac source of emboli was indentified.   CUS - Preliminary report: 40-59% right ICA stenosis. 60-79% left ICA stenosis. Bilateral Vertebral artery flow is antegrade.    PHYSICAL EXAM  Temp:  [98 F (36.7 C)-98.6 F (37 C)] 98.6 F (37 C) (09/12 2230) Pulse Rate:  [68-88] 68 (09/12 2230) Resp:  [18-20] 18 (09/12 2230) BP: (123-149)/(69-78) 132/75 mmHg (09/12 2230) SpO2:  [96 %-97 %] 96 % (09/12 2230)  General - Well nourished, well developed, in no apparent distress.  Ophthalmologic - Sharp disc margins OU.  Cardiovascular - Regular rate and rhythm with no murmur.  Mental Status -  Level of arousal and orientation to time, place, and person were intact. Language including expression, naming, repetition, comprehension, reading, and writing was assessed and found intact. Attention span and concentration were normal. Recent and remote memory were 3/3 registration and 1/3 delayed recall. Fund of Knowledge was assessed and was [employer].  Cranial Nerves II - XII - II - Visual field intact OU. III, IV, VI - Extraocular movements intact. V - Facial sensation intact bilaterally. VII - Facial movement intact bilaterally. VIII - Hearing & vestibular intact bilaterally. X - Palate elevates symmetrically. XI - Chin turning &  shoulder shrug intact bilaterally XII - Tongue protrusion intact.  Motor Strength - The patient's strength was normal in all extremities and pronator drift was absent.  Bulk was normal and fasciculations were absent.   Motor Tone - Muscle tone was assessed at the neck and appendages and was normal.  Reflexes - The patient's reflexes were normal in all extremities and he had no pathological reflexes.  Sensory - Light touch, temperature/pinprick, vibration and proprioception, and Romberg testing were assessed and were normal.    Coordination - The patient had normal movements in the hands and feet with no ataxia or dysmetria.  Tremor was absent.  Gait and Station - The patient's transfers, posture, gait, station, and turns were observed as normal.   ASSESSMENT/PLAN  Jesse Avila is a 63 y.o. male with history of HTN, HLD, DM, heavy drinker, former smoker  presenting with left sided weakness. He did not receive IV t-PA due to out of window. Imaging confirms  a right MCA infarct.   Stroke right BG and insular infarct, thrombotic most likely given the location and risk factors     aspirin 81 mg orally every day prior to admission, now on clopidogrel 75 mg orally every day  MRI  Right BG and insular infarcts, but also BG and cerebella small strokes in the past as per MRI showed.   MRA  No LVO  Carotid Doppler  B/l carotid stenosis, right 40-60%, left 60-80%  Will need CTA neck to further evaluate.  2D Echo  unremarkable    LDL 66, on lipitor 40mg  now, at home on crestor, meeting goal of LDL <100  HgbA1c 7.2, not on the goal, diabetes and needs medication  lovenox for VTE prophylaxis  Carb Control   Activity as tolerated  Hypertension   BP 126-148 past 24h (04/15/2014 @ 11:03 PM)  SBP goal normotensive within 3-5 days.  Stable  Patient counseled to be compliant with his blood pressure medications  Hyperlipidemia  Home meds:  crestor. Resumed in hospital with lipitor  (formulary change)  LDL 66   LDL goal <70 for diabetics  Diabetes  Home meds:  none  HgbA1c 7.2   Uncontrolled  Goal < 6.5  SSI  educated patient about lifestyle changes for diabetes treatment prevention  Other Stroke Risk Factors Advanced age Cigarette former smoker/ Excessive ETOH use - on B1, folic acid supplement now   Obesity, Body mass index is 29.15 kg/(m^2).    Hospital day # 1  Rosalin Hawking, MD PhD Stroke Neurology 04/15/2014 11:18 PM      To contact Stroke Continuity provider, please refer to [email or web address] After hours, contact General Neurology

## 2014-04-15 NOTE — Progress Notes (Signed)
  Echocardiogram 2D Echocardiogram has been performed.  Jesse Avila 04/15/2014, 2:53 PM

## 2014-04-15 NOTE — Progress Notes (Signed)
[facility]                                                                              Patient Demographics  Jesse Avila, is a 63 y.o. male, DOB - 02/15/1951, SVX:[id number]  Admit date - 04/14/2014   Admitting Physician No admitting provider for patient encounter.  Outpatient Primary MD for the patient is Simona Huh, MD  LOS - 1   Chief Complaint  Patient presents with  . Extremity Weakness    left side  . Altered Mental Status      HPI on 04/14/2014 63 year old obese left side dominant male with a history of hypertension, peripheral vascular disease, diabetes mellitus and hyperlipidemia who presented to the ED for left-sided weakness. He reported having acute onset of weakness involving both his left upper and lower limb with decreased sensation and unsteady gait . The symptoms slowly improved however he had difficulty writing and holding objects in his left hand. He denid similar symptoms in the past. Denied loss of consciousness. He spoke with his PCP and was advised to come to the ED.  Patient denied headache, dizziness, fever, chills, nausea , vomiting, chest pain, palpitations, SOB, abdominal pain, bowel or urinary symptoms, change in weight or appetite.   Assessment & Plan   Acute CVA -CT of the head:Band of hypoattenuation in the posterior right frontal lobe is consistent with ischemia -MRI of the brain: Acute ischemic infarct within the posterior/inferior right  frontal lobe as above. No significant mass effect or associated hemorrhage.  -Carotid Doppler:40-59% right ICA stenosis. 60-79% left ICA stenosis. Bilateral Vertebral artery flow is antegrade.  -Echocardiogram pending -Neurology consulted and appreciated, recommended plavix -PT evaluated and patient doesnot need follow up -Pending OT eval -LDL 66, A1c pending  GERD -Continue PPI  Hypertension -Controlled, Benicar currently held -Allowing for permissive hypertension  Diabetes mellitus type  2 -Continue insulin sliding scale with CBG monitoring -Pending hemoglobin A1c -Holding home medication  Obesity -Upon discharge, patient should speak to his primary care physician regarding an exercise program and lifestyle modifications  Hyperlipidemia -Continue statin and fenofibrate  Code Status: Full  Family Communication: Wife at bedside  Disposition Plan: Admitted, currently pending workup  Time Spent in minutes   30 minutes  Procedures  -Carotid Doppler:40-59% right ICA stenosis. 60-79% left ICA stenosis. Bilateral Vertebral artery flow is antegrade.   -Echocardiogram pending  Consults   Neurology  DVT Prophylaxis  Lovenox  Lab Results  Component Value Date   PLT 169 04/14/2014    Medications  Scheduled Meds: . atorvastatin  40 mg Oral q1800  . clopidogrel  75 mg Oral Daily  . enoxaparin (LOVENOX) injection  40 mg Subcutaneous Q24H  . fenofibrate  160 mg Oral Daily  . folic acid  1 mg Oral Daily  . insulin aspart  0-15 Units Subcutaneous TID WC  . insulin aspart  0-5 Units Subcutaneous QHS  . multivitamin with minerals  1 tablet Oral Daily  . thiamine  100 mg Oral Daily   Or  . thiamine  100 mg Intravenous Daily   Continuous Infusions:  PRN Meds:.LORazepam, LORazepam, senna-docusate, zolpidem  Antibiotics    Anti-infectives   None  Subjective:   Jesse Avila seen and examined today.  Patient states he is feeling much better, however still feels some numbness and weakness in his left arm. Denies any chest pain, shortness of breath, dizziness, headache.    Objective:   Filed Vitals:   04/15/14 0600 04/15/14 0919 04/15/14 1215 04/15/14 1347  BP: 144/74 132/69 149/71 123/76  Pulse: 68 74 68 88  Temp: 98 F (36.7 C) 98.1 F (36.7 C)  98.5 F (36.9 C)  TempSrc: Oral Oral  Oral  Resp: 20 20  20   Height:      Weight:      SpO2: 97% 97%  97%    Wt Readings from Last 3 Encounters:  04/14/14 97.523 kg (215 lb)  10/18/13 96.616 kg (213 lb)   06/17/13 95.255 kg (210 lb)     Intake/Output Summary (Last 24 hours) at 04/15/14 1523 Last data filed at 04/14/14 1800  Gross per 24 hour  Intake    360 ml  Output      0 ml  Net    360 ml    Exam  General: Well developed, well nourished, NAD, appears stated age  HEENT: NCAT, PERRLA, EOMI, Anicteic Sclera, mucous membranes moist.   Cardiovascular: S1 S2 auscultated, no rubs, murmurs or gallops. Regular rate and rhythm.  Respiratory: Clear to auscultation bilaterally with equal chest rise  Abdomen: Soft, nontender, nondistended, + bowel sounds  Extremities: warm dry without cyanosis clubbing or edema  Neuro: AAOx3, cranial nerves grossly intact. Strength 5/5 in patient's upper and lower extremities bilaterally  Skin: Without rashes exudates or nodules  Psych: Normal affect and demeanor with intact judgement and insight  Data Review   Micro Results No results found for this or any previous visit (from the past 240 hour(s)).  Radiology Reports Ct Head Wo Contrast  04/14/2014   CLINICAL DATA:  Left-sided weakness begin on Wednesday at 14:30 hr.  EXAM: CT HEAD WITHOUT CONTRAST  TECHNIQUE: Contiguous axial images were obtained from the base of the skull through the vertex without intravenous contrast.  COMPARISON:  None.  FINDINGS: There is a band of hyperdensity in the posterior right frontal lobe on images 15 through 17. There are no prior head CTs for comparison. No evidence of mass effect on the adjacent right lateral ventricle and no definite mass effect on the adjacent sulci. Ventricles normal in size. Negative for midline shift. Negative for intra or extra-axial hemorrhage. Bilateral physiologic basal ganglia calcifications noted.  Mucosal thickening and fluid is seen in multiple ethmoid air cells. The remainder of the visualized portions of the paranasal sinuses are clear. The mastoid air cells are clear. The skull is intact. Soft tissues of the scalp and orbits are  symmetric.  IMPRESSION: Band of hypoattenuation in the posterior right frontal lobe is consistent with ischemia. Given the CT appearances and the patient's left upper and lower extremity weakness which occurred 2 days ago, this is favored to reflect an acute to subacute infarction. Critical Value/emergent results were called by telephone at the time of interpretation on 04/14/2014 at 11:55 am to Dr. Jamse Mead , who verbally acknowledged these results.   Electronically Signed   By: Curlene Dolphin M.D.   On: 04/14/2014 12:00   Mr Brain Wo Contrast  04/15/2014   CLINICAL DATA:  stroke  EXAM: MRI HEAD WITHOUT CONTRAST  MRA HEAD WITHOUT CONTRAST  TECHNIQUE: Multiplanar, multiecho pulse sequences of the brain and surrounding structures were obtained without intravenous contrast. Angiographic images of  the head were obtained using MRA technique without contrast.  COMPARISON:  None.  FINDINGS: MRI HEAD FINDINGS  Focal air restricted diffusion present within the posterior inferior right frontal lobe, consistent with acute ischemic infarct. Area of infarct measures 1.3 x 1.5 x 1.6 cm. Mild associated T2/FLAIR signal intensity. No significant mass effect. No associated hemorrhage. No other acute intracranial infarct identified.  Mild diffuse prominence of the CSF containing spaces is compatible with generalized cerebral atrophy. Small remote lacunar infarcts noted within the right caudate head. There is an additional remote infarct within the right cerebellar hemisphere. Minimal patchy T2/FLAIR hyperintensity noted within the periventricular and deep white matter both cerebral hemispheres, likely related to mild chronic small vessel ischemic disease, within normal limits for patient age.  No mass lesion or midline shift. No hydrocephalus. No extra-axial fluid collection.  The pituitary gland within normal limits. No acute abnormality seen about either orbit.  Craniocervical junction within normal limits. Calvarium and  scalp soft tissues are unremarkable.  Small retention cyst noted within the right maxillary sinus. Scattered mucosal thickening present within the ethmoidal air cells bilaterally. No mastoid effusion.  MRA HEAD FINDINGS  ANTERIOR CIRCULATION:  The visualized portions of the distal cervical segments of the internal carotid arteries are well opacified bilaterally with widely patent antegrade flow. Multi focal atherosclerotic irregularity seen within the cavernous segments bilaterally. There is associated mild to moderate multi focal irregular stenosis within the cavernous right ICA. There is question of a small focal 2 mm outpouching extending from the posterior aspect of the cavernous left ICA (series 5, image 74). Finding may reflect a small aneurysm. This is posteriorly directed.  The right A1 segment is hypoplastic. The left A1 segment is widely patent. Anterior cerebral arteries within normal limits.  Middle cerebral arteries including the M1 segments are widely patent without hemodynamically significant stenosis or occlusion. Distal MCA branches well opacified bilaterally.  POSTERIOR CIRCULATION:  The vertebral arteries are widely patent with antegrade flow. The posterior inferior cerebral arteries are normal. Vertebrobasilar junction and basilar artery are widely patent with antegrade flow without evidence of basilar tip stenosis or aneurysm. Posterior cerebral arteries are normal bilaterally. The superior cerebellar arteries and anterior inferior cerebellar arteries are widely patent bilaterally. No intracranial aneurysm within the posterior circulation.  IMPRESSION: MRI HEAD IMPRESSION:  1. Acute ischemic infarct within the posterior/inferior right frontal lobe as above. No significant mass effect or associated hemorrhage. 2. Remote infarcts involving the right basal ganglia and right cerebellar hemisphere. 3. Mild generalized cerebral atrophy.  MRA HEAD IMPRESSION:  1. No proximal branch occlusion or  hemodynamically significant stenosis within the intracranial circulation. 2. Multi focal atherosclerotic irregularity within the cavernous ICAs bilaterally, right greater than left. 3. Question 2-3 mm focal outpouching extending from the cavernous left ICA as above. Finding is suspicious for a possible small aneurysm.   Electronically Signed   By: Jeannine Boga M.D.   On: 04/15/2014 01:45   Mr Jodene Nam Head/brain Wo Cm  04/15/2014   CLINICAL DATA:  stroke  EXAM: MRI HEAD WITHOUT CONTRAST  MRA HEAD WITHOUT CONTRAST  TECHNIQUE: Multiplanar, multiecho pulse sequences of the brain and surrounding structures were obtained without intravenous contrast. Angiographic images of the head were obtained using MRA technique without contrast.  COMPARISON:  None.  FINDINGS: MRI HEAD FINDINGS  Focal air restricted diffusion present within the posterior inferior right frontal lobe, consistent with acute ischemic infarct. Area of infarct measures 1.3 x 1.5 x 1.6 cm. Mild associated T2/FLAIR signal  intensity. No significant mass effect. No associated hemorrhage. No other acute intracranial infarct identified.  Mild diffuse prominence of the CSF containing spaces is compatible with generalized cerebral atrophy. Small remote lacunar infarcts noted within the right caudate head. There is an additional remote infarct within the right cerebellar hemisphere. Minimal patchy T2/FLAIR hyperintensity noted within the periventricular and deep white matter both cerebral hemispheres, likely related to mild chronic small vessel ischemic disease, within normal limits for patient age.  No mass lesion or midline shift. No hydrocephalus. No extra-axial fluid collection.  The pituitary gland within normal limits. No acute abnormality seen about either orbit.  Craniocervical junction within normal limits. Calvarium and scalp soft tissues are unremarkable.  Small retention cyst noted within the right maxillary sinus. Scattered mucosal thickening  present within the ethmoidal air cells bilaterally. No mastoid effusion.  MRA HEAD FINDINGS  ANTERIOR CIRCULATION:  The visualized portions of the distal cervical segments of the internal carotid arteries are well opacified bilaterally with widely patent antegrade flow. Multi focal atherosclerotic irregularity seen within the cavernous segments bilaterally. There is associated mild to moderate multi focal irregular stenosis within the cavernous right ICA. There is question of a small focal 2 mm outpouching extending from the posterior aspect of the cavernous left ICA (series 5, image 74). Finding may reflect a small aneurysm. This is posteriorly directed.  The right A1 segment is hypoplastic. The left A1 segment is widely patent. Anterior cerebral arteries within normal limits.  Middle cerebral arteries including the M1 segments are widely patent without hemodynamically significant stenosis or occlusion. Distal MCA branches well opacified bilaterally.  POSTERIOR CIRCULATION:  The vertebral arteries are widely patent with antegrade flow. The posterior inferior cerebral arteries are normal. Vertebrobasilar junction and basilar artery are widely patent with antegrade flow without evidence of basilar tip stenosis or aneurysm. Posterior cerebral arteries are normal bilaterally. The superior cerebellar arteries and anterior inferior cerebellar arteries are widely patent bilaterally. No intracranial aneurysm within the posterior circulation.  IMPRESSION: MRI HEAD IMPRESSION:  1. Acute ischemic infarct within the posterior/inferior right frontal lobe as above. No significant mass effect or associated hemorrhage. 2. Remote infarcts involving the right basal ganglia and right cerebellar hemisphere. 3. Mild generalized cerebral atrophy.  MRA HEAD IMPRESSION:  1. No proximal branch occlusion or hemodynamically significant stenosis within the intracranial circulation. 2. Multi focal atherosclerotic irregularity within the  cavernous ICAs bilaterally, right greater than left. 3. Question 2-3 mm focal outpouching extending from the cavernous left ICA as above. Finding is suspicious for a possible small aneurysm.   Electronically Signed   By: Jeannine Boga M.D.   On: 04/15/2014 01:45    CBC  Recent Labs Lab 04/14/14 1127 04/14/14 1147  WBC 3.5*  --   HGB 12.7* 12.6*  HCT 36.5* 37.0*  PLT 169  --   MCV 93.4  --   MCH 32.5  --   MCHC 34.8  --   RDW 12.9  --   LYMPHSABS 1.3  --   MONOABS 0.3  --   EOSABS 0.3  --   BASOSABS 0.0  --     Chemistries   Recent Labs Lab 04/14/14 1127 04/14/14 1147  NA 138 139  K 4.2 4.1  CL 101 107  CO2 23  --   GLUCOSE 161* 164*  BUN 16 15  CREATININE 0.92 1.00  CALCIUM 9.8  --   AST 24  --   ALT 22  --   ALKPHOS 54  --  BILITOT 0.4  --    ------------------------------------------------------------------------------------------------------------------ estimated creatinine clearance is 95.1 ml/min (by C-G formula based on Cr of 1). ------------------------------------------------------------------------------------------------------------------ No results found for this basename: HGBA1C,  in the last 72 hours ------------------------------------------------------------------------------------------------------------------  Recent Labs  04/15/14 0550  CHOL 147  HDL 29*  LDLCALC 66  TRIG 261*  CHOLHDL 5.1   ------------------------------------------------------------------------------------------------------------------ No results found for this basename: TSH, T4TOTAL, FREET3, T3FREE, THYROIDAB,  in the last 72 hours ------------------------------------------------------------------------------------------------------------------ No results found for this basename: VITAMINB12, FOLATE, FERRITIN, TIBC, IRON, RETICCTPCT,  in the last 72 hours  Coagulation profile  Recent Labs Lab 04/14/14 1127  INR 0.99    No results found for this  basename: DDIMER,  in the last 72 hours  Cardiac Enzymes No results found for this basename: CK, CKMB, TROPONINI, MYOGLOBIN,  in the last 168 hours ------------------------------------------------------------------------------------------------------------------ No components found with this basename: POCBNP,     Adalid Beckmann D.O. on 04/15/2014 at 3:23 PM  Between 7am to 7pm - Pager - [phone]  After 7pm go to [email or web address] - password [email or web address]  And look for the night coverage person covering for me after hours  [facility] Office  [phone]

## 2014-04-15 NOTE — Evaluation (Signed)
Speech Language Pathology Evaluation Patient Details Name: Jesse Avila MRN: [id number] DOB: [birth year] Today's Date: 04/15/2014 Time: 2563-8937 SLP Time Calculation (min): 9 min  Problem List:  Patient Active Problem List   Diagnosis Date Noted  . Acute ischemic stroke 04/14/2014  . Diabetes mellitus, type 2 04/14/2014  . Obesity 04/14/2014  . Insomnia 10/18/2013  . Pain in limb 05/20/2012  . PVD (peripheral vascular disease) 04/14/2012  . Preop cardiovascular exam 12/12/2011  . DJD (degenerative joint disease) of knee   . ED (erectile dysfunction)   . Arthritis   . GERD (gastroesophageal reflux disease)   . Hypertension   . Peripheral vascular disease   . Hyperlipidemia   . Atherosclerosis of native arteries of the extremities with intermittent claudication 11/19/2011   Past Medical History:  Past Medical History  Diagnosis Date  . Hyperlipidemia   . Peripheral vascular disease     With moderate Claudication  . Hypertension   . Diabetes mellitus 8/20012    Type II  . GERD (gastroesophageal reflux disease)   . Arthritis     Gout  . ED (erectile dysfunction)   . DJD (degenerative joint disease) of knee     Bilateral   Past Surgical History:  Past Surgical History  Procedure Laterality Date  . Joint replacement  11/2009    Right knee arthroscopy  . Meniscus repair  4/20191   HPI:  63 year old male admitted 04/14/14 due to left weakness and AMS. MRI revealed mild atrophy, and infarct in the posterior inferior right frontal lobe.   Assessment / Plan / Recommendation Clinical Impression  No observed or reported deficits. SLP initiated MoCA, however, did not complete due to pt request. Pt assured SLP he was fine, and just wanted to go home. Pt was encouraged to contact MD if needs arise after DC. ST signing off at this time.    SLP Assessment  Patient does not need any further Speech Lanaguage Pathology Services    Follow Up Recommendations    outpatient if needs  arise    Frequency and Duration   n/a     Pertinent Vitals/Pain Pain Assessment: No/denies pain   SLP Goals   n/a  SLP Evaluation Prior Functioning  Cognitive/Linguistic Baseline: Within functional limits Type of Home: House  Lives With: Spouse Available Help at Discharge: Family;Available 24 hours/day Education: high school graduate Vocation: Full time employment   Cognition  Overall Cognitive Status: Within Functional Limits for tasks assessed Arousal/Alertness: Awake/alert Orientation Level: Oriented X4    Comprehension  Auditory Comprehension Overall Auditory Comprehension: Appears within functional limits for tasks assessed    Expression Expression Primary Mode of Expression: Verbal Verbal Expression Overall Verbal Expression: Appears within functional limits for tasks assessed   Oral / Motor Oral Motor/Sensory Function Overall Oral Motor/Sensory Function: Appears within functional limits for tasks assessed Motor Speech Overall Motor Speech: Appears within functional limits for tasks assessed   GO    Celia B. Bradford, [facility], [place]  Shonna Chock 04/15/2014, 4:40 PM

## 2014-04-15 NOTE — Evaluation (Signed)
Physical Therapy Evaluation Patient Details Name: Jesse Avila MRN: [id number] DOB: 08-27-1950 Today's Date: 04/15/2014   History of Present Illness  63 y.o. male with left hemiparesis (remarkably improved) since 04/12/14 and CT brain with findings suggestive of an acute/subacute right posterior frontal infarct.   Clinical Impression  Patient evaluated by Physical Therapy with no further acute PT needs identified. All education has been completed and the patient has no further questions. Pt educated on signs and symptoms of stroke; wife present. See below for any follow-up Physial Therapy or equipment needs. PT is signing off. Thank you for this referral.Pt to benefit from skilled OT due to c/o decr sensation in dominant hand (Left) and decr grip strength.      Follow Up Recommendations No PT follow up    Equipment Recommendations  None recommended by PT    Recommendations for Other Services OT consult     Precautions / Restrictions Precautions Precautions: None Restrictions Weight Bearing Restrictions: No      Mobility  Bed Mobility Overal bed mobility: Independent                Transfers Overall transfer level: Independent Equipment used: None             General transfer comment: no LOB or sway  Ambulation/Gait Ambulation/Gait assistance: Independent Ambulation Distance (Feet): 400 Feet Assistive device: None Gait Pattern/deviations: WFL(Within Functional Limits)   Gait velocity interpretation: at or above normal speed for age/gender General Gait Details: see high level balance activities; pt demo good technique and safety awareness; no deficits noted with gt  Stairs Stairs: Yes Stairs assistance: Modified independent (Device/Increase time) Stair Management: Alternating pattern;No rails;Forwards Number of Stairs: 3 General stair comments: demo good technique; simulated home envrioment; no difficulties  Wheelchair Mobility    Modified Rankin  (Stroke Patients Only) Modified Rankin (Stroke Patients Only) Pre-Morbid Rankin Score: No symptoms Modified Rankin: No significant disability     Balance Overall balance assessment: No apparent balance deficits (not formally assessed)                           High level balance activites: Direction changes;Backward walking;Turns;Sudden stops;Head turns High Level Balance Comments: no LOB noted; pt also able to pick up objects off floor without difficulty              Pertinent Vitals/Pain Pain Assessment: No/denies pain    Home Living Family/patient expects to be discharged to:: Private residence Living Arrangements: Spouse/significant other Available Help at Discharge: Family;Available 24 hours/day Type of Home: House Home Access: Stairs to enter Entrance Stairs-Rails: None Entrance Stairs-Number of Steps: 2 Home Layout: One level Home Equipment: None      Prior Function Level of Independence: Independent               Hand Dominance   Dominant Hand: Left    Extremity/Trunk Assessment   Upper Extremity Assessment: Defer to OT evaluation (c/o of difficulty gripping paper and decr sensation)           Lower Extremity Assessment: Overall WFL for tasks assessed      Cervical / Trunk Assessment: Normal  Communication   Communication: No difficulties  Cognition Arousal/Alertness: Awake/alert Behavior During Therapy: WFL for tasks assessed/performed Overall Cognitive Status: Within Functional Limits for tasks assessed                      General Comments General comments (skin  integrity, edema, etc.): educated pt on signs and symptoms of sstroke "BE FAST"; pt verbalized understanding and importance of early detection    Exercises        Assessment/Plan    PT Assessment Patent does not need any further PT services  PT Diagnosis     PT Problem List    PT Treatment Interventions     PT Goals (Current goals can be found in  the Care Plan section) Acute Rehab PT Goals Patient Stated Goal: home today PT Goal Formulation: No goals set, d/c therapy    Frequency     Barriers to discharge        Co-evaluation               End of Session   Activity Tolerance: Patient tolerated treatment well Patient left: in chair;with call bell/phone within reach;with family/visitor present Nurse Communication: Mobility status         Time: 3810-1751 PT Time Calculation (min): 13 min   Charges:   PT Evaluation $Initial PT Evaluation Tier I: 1 Procedure PT Treatments $Gait Training: 8-22 mins   PT G CodesGustavus Bryant, [place] 04/15/2014, 10:22 AM

## 2014-04-15 NOTE — Progress Notes (Signed)
OT Cancellation Note  Patient Details Name: Jesse Avila MRN: [id number] DOB: 1951-01-25   Cancelled Treatment:    Reason Eval/Treat Not Completed: Patient at procedure or test/ unavailable (with echo. will attempt to retun later to assess)  [place], OTR/L  [phone] 04/15/2014 04/15/2014, 4:55 PM

## 2014-04-15 NOTE — Progress Notes (Signed)
VASCULAR LAB PRELIMINARY  PRELIMINARY  PRELIMINARY  PRELIMINARY  Carotid Dopplers completed.    Preliminary report:  40-59% right ICA stenosis. 60-79% left ICA stenosis.  Bilateral Vertebral artery flow is antegrade.   Lumen Brinlee, RVT 04/15/2014, 10:55 AM

## 2014-04-16 ENCOUNTER — Encounter (HOSPITAL_COMMUNITY): Payer: Self-pay

## 2014-04-16 ENCOUNTER — Inpatient Hospital Stay (HOSPITAL_COMMUNITY): Payer: 59

## 2014-04-16 DIAGNOSIS — I6529 Occlusion and stenosis of unspecified carotid artery: Secondary | ICD-10-CM

## 2014-04-16 DIAGNOSIS — IMO0002 Reserved for concepts with insufficient information to code with codable children: Secondary | ICD-10-CM

## 2014-04-16 DIAGNOSIS — I658 Occlusion and stenosis of other precerebral arteries: Secondary | ICD-10-CM

## 2014-04-16 LAB — GLUCOSE, CAPILLARY
GLUCOSE-CAPILLARY: 133 mg/dL — AB (ref 70–99)
Glucose-Capillary: 107 mg/dL — ABNORMAL HIGH (ref 70–99)

## 2014-04-16 MED ORDER — IOHEXOL 350 MG/ML SOLN
50.0000 mL | Freq: Once | INTRAVENOUS | Status: AC | PRN
  Administered 2014-04-16: 50 mL via INTRAVENOUS

## 2014-04-16 MED ORDER — CLOPIDOGREL BISULFATE 75 MG PO TABS: 75.0000 mg | ORAL_TABLET | Freq: Every day | ORAL | Status: DC

## 2014-04-16 MED ORDER — METFORMIN HCL 500 MG PO TABS: 500.0000 mg | ORAL_TABLET | Freq: Three times a day (TID) | ORAL | Status: DC

## 2014-04-16 MED ORDER — ASPIRIN EC 81 MG PO TBEC
81.0000 mg | DELAYED_RELEASE_TABLET | Freq: Every day | ORAL | Status: DC
  Administered 2014-04-16: 81 mg via ORAL
  Filled 2014-04-16: qty 1

## 2014-04-16 MED ORDER — ASPIRIN 81 MG PO TABS: 81.0000 mg | ORAL_TABLET | Freq: Every day | ORAL | Status: AC

## 2014-04-16 NOTE — Progress Notes (Signed)
STROKE TEAM PROGRESS NOTE   HISTORY Jesse Avila is an 63 y.o. male, left handed, with a past medical history significant for HTN, hyperlipidemia, DM type II, PVD, gout, DJD, transferred to [facility] for further stroke evaluation and management.  Patient initially presented at [facility] with complains of left leg weakness follow by poor motor control of the left arm that started this past Wednesday. In addition, he complains of the left arm " feeling unusual" but denies HA, vertigo, double vision, difficulty swallowing, slurred speech, language or vision impairment.  Jesse Avila indicated that he couldn't walk normal last Wednesday and was dragging the left leg. He said that the weakness got better the next day and for that reason he did not seek immediate medical attention.He spoke with his PCP and was advised to come to the ED  CT brain with findings consistent with an acute to subacute infarction right posterior frontal lobe.  Takes aspirin daily.  Date last known well: 04/12/14  Time last known well: uncertain  tPA Given: no, out of the window  SUBJECTIVE (INTERVAL HISTORY) His wife and daughter are at the bedside.  Overall he feels his condition is completely resolved. He is eager to go home. CTA neck done showed right ICA 80% stenosis. Vascular surgery consult requested.  OBJECTIVE Temp:  [97.9 F (36.6 C)-98.6 F (37 C)] 97.9 F (36.6 C) (09/13 1343) Pulse Rate:  [60-81] 66 (09/13 1343) Cardiac Rhythm:  [-] Normal sinus rhythm (09/13 0830) Resp:  [18-20] 20 (09/13 1343) BP: (113-134)/(64-81) 124/64 mmHg (09/13 1343) SpO2:  [96 %-98 %] 97 % (09/13 1343)   Recent Labs Lab 04/15/14 1145 04/15/14 1643 04/15/14 2200 04/16/14 0633 04/16/14 1213  GLUCAP 99 149* 149* 133* 107*    Recent Labs Lab 04/14/14 1127 04/14/14 1147  NA 138 139  K 4.2 4.1  CL 101 107  CO2 23  --   GLUCOSE 161* 164*  BUN 16 15  CREATININE 0.92 1.00  CALCIUM 9.8  --     Recent Labs Lab 04/14/14 1127  AST 24   ALT 22  ALKPHOS 54  BILITOT 0.4  PROT 7.7  ALBUMIN 4.5    Recent Labs Lab 04/14/14 1127 04/14/14 1147  WBC 3.5*  --   NEUTROABS 1.6*  --   HGB 12.7* 12.6*  HCT 36.5* 37.0*  MCV 93.4  --   PLT 169  --    No results found for this basename: CKTOTAL, CKMB, CKMBINDEX, TROPONINI,  in the last 168 hours  Recent Labs  04/14/14 1127  LABPROT 13.1  INR 0.99    Recent Labs  04/14/14 1607  COLORURINE YELLOW  LABSPEC 1.013  PHURINE 6.5  GLUCOSEU 100*  HGBUR NEGATIVE  BILIRUBINUR NEGATIVE  KETONESUR NEGATIVE  PROTEINUR NEGATIVE  UROBILINOGEN 0.2  NITRITE NEGATIVE  LEUKOCYTESUR NEGATIVE       Component Value Date/Time   CHOL 147 04/15/2014 0550   TRIG 261* 04/15/2014 0550   HDL 29* 04/15/2014 0550   CHOLHDL 5.1 04/15/2014 0550   VLDL 52* 04/15/2014 0550   LDLCALC 66 04/15/2014 0550   Lab Results  Component Value Date   HGBA1C 7.2* 04/15/2014      Component Value Date/Time   LABOPIA NONE DETECTED 04/14/2014 1607   COCAINSCRNUR NONE DETECTED 04/14/2014 1607   LABBENZ NONE DETECTED 04/14/2014 1607   AMPHETMU NONE DETECTED 04/14/2014 1607   THCU NONE DETECTED 04/14/2014 1607   LABBARB NONE DETECTED 04/14/2014 1607     Recent Labs Lab 04/14/14 1127  ETH <11    Ct Head Wo Contrast  04/14/2014   IMPRESSION: Band of hypoattenuation in the posterior right frontal lobe is consistent with ischemia. Given the CT appearances and the patient's left upper and lower extremity weakness which occurred 2 days ago, this is favored to reflect an acute to subacute infarction.    Mri and Mra Head/brain Wo Cm  04/15/2014 IMPRESSION: MRI HEAD IMPRESSION:  1. Acute ischemic infarct within the posterior/inferior right frontal lobe as above. No significant mass effect or associated hemorrhage. 2. Remote infarcts involving the right basal ganglia and right cerebellar hemisphere. 3. Mild generalized cerebral atrophy.  MRA HEAD IMPRESSION:  1. No proximal branch occlusion or hemodynamically  significant stenosis within the intracranial circulation. 2. Multi focal atherosclerotic irregularity within the cavernous ICAs bilaterally, right greater than left. 3. Question 2-3 mm focal outpouching extending from the cavernous left ICA as above. Finding is suspicious for a possible small aneurysm.     2D echo - - Left ventricle: The cavity size was normal. Wall thickness was normal. Systolic function was normal. The estimated ejection fraction was in the range of 55% to 60%. Wall motion was normal; there were no regional wall motion abnormalities. Doppler parameters are consistent with abnormal left ventricular relaxation (grade 1 diastolic dysfunction). - Left atrium: The atrium was mildly dilated. Impressions: - No cardiac source of emboli was indentified.   CUS - Preliminary report: 40-59% right ICA stenosis. 60-79% left ICA stenosis. Bilateral Vertebral artery flow is antegrade.   CTA neck -1 cm segment of severely stenotic right internal carotid artery with  luminal narrowing of 80% diameter stenosis. Moderately severe  stenosis of the cavernous carotid due to heavily calcified  atherosclerotic disease.  Linear filling defect in the origin of the left common carotid  artery may represent atherosclerotic plaque or focal dissection with  mild stenosis. 60% diameter stenosis distal left common carotid  artery. 50% diameter stenosis left internal carotid artery. Heavily  calcified cavernous carotid artery with moderately severe stenosis  Mild vertebral atherosclerotic disease.    PHYSICAL EXAM  Temp:  [97.9 F (36.6 C)-98.6 F (37 C)] 97.9 F (36.6 C) (09/13 1343) Pulse Rate:  [60-81] 66 (09/13 1343) Resp:  [18-20] 20 (09/13 1343) BP: (113-134)/(64-81) 124/64 mmHg (09/13 1343) SpO2:  [96 %-98 %] 97 % (09/13 1343)  General - Well nourished, well developed, in no apparent distress.  Ophthalmologic - Sharp disc margins OU.  Cardiovascular - Regular rate and rhythm with no  murmur.  Mental Status -  Level of arousal and orientation to time, place, and person were intact. Language including expression, naming, repetition, comprehension, reading, and writing was assessed and found intact. Attention span and concentration were normal. Recent and remote memory were 3/3 registration and 1/3 delayed recall. Fund of Knowledge was assessed and was [employer].  Cranial Nerves II - XII - II - Visual field intact OU. III, IV, VI - Extraocular movements intact. V - Facial sensation intact bilaterally. VII - Facial movement intact bilaterally. VIII - Hearing & vestibular intact bilaterally. X - Palate elevates symmetrically. XI - Chin turning & shoulder shrug intact bilaterally XII - Tongue protrusion intact.  Motor Strength - The patient's strength was normal in all extremities and pronator drift was absent.  Bulk was normal and fasciculations were absent.   Motor Tone - Muscle tone was assessed at the neck and appendages and was normal.  Reflexes - The patient's reflexes were normal in all extremities and he had  no pathological reflexes.  Sensory - Light touch, temperature/pinprick, vibration and proprioception, and Romberg testing were assessed and were normal.    Coordination - The patient had normal movements in the hands and feet with no ataxia or dysmetria.  Tremor was absent.  Gait and Station - The patient's transfers, posture, gait, station, and turns were observed as normal.   ASSESSMENT/PLAN  Jesse Avila is a 63 y.o. male with history of HTN, HLD, DM, heavy drinker, former smoker  presenting with left sided weakness. He did not receive IV t-PA due to out of window. Imaging confirms a right MCA infarct.   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   MRA  No LVO  Carotid Doppler  B/l carotid stenosis, right 40-60%, left 60-80%  CTA neck showed right ICA 80% and left ICA 50% stenosis with cavenous ICA severe stenosis too.  2D Echo  unremarkable   LDL 66, on lipitor 40mg  now, at home on crestor, meeting goal of LDL <100  HgbA1c 7.2, not on the goal, diabetes and needs medication  lovenox for VTE prophylaxis  Carb Control   Activity as tolerated  Carotid stenosis b/l - right ICA 80%, symptomatic side - request vascular surgery consult for consideration of CEA. Ideally surgery should be done within 2 weeks of stroke. - information relayed to primary team - left ICA 50%, asymptomatic side  Hypertension   BP 126-148 past 24h (04/16/2014 @ 3:17 PM)  SBP goal normotensive within 3-5 days.  Stable  Patient counseled to be compliant with his blood pressure medications  Hyperlipidemia  Home meds:  crestor. Resumed in hospital with lipitor (formulary change)  LDL 66   LDL goal <70 for diabetics  Diabetes  Home meds:  none  HgbA1c 7.2   Uncontrolled  Goal < 6.5  SSI  educated patient about lifestyle changes for diabetes treatment prevention  Other Stroke Risk Factors Advanced age Cigarette former smoker/ Excessive ETOH use - on B1, folic acid supplement now   Obesity, Body mass index is 29.15 kg/(m^2).    Hospital day # 2  Neurology sign off and please call with questions. Pt should follow up with Dr. Erlinda Hong in stroke clinic in 2 months.   Rosalin Hawking, MD PhD Stroke Neurology 04/16/2014 3:17 PM      To contact Stroke Continuity provider, please refer to [email or web address] After hours, contact General Neurology

## 2014-04-16 NOTE — Discharge Summary (Signed)
Physician Discharge Summary  Jesse Avila YSA:[id number] DOB: [birth year] DOA: 04/14/2014  PCP: Simona Huh, MD  Admit date: 04/14/2014 Discharge date: 04/16/2014  Time spent: 45 minutes  Recommendations for Outpatient Follow-up:  Patient will be discharged home. He will need followup with his primary care physician within one week. Patient should also follow up with Dr. Bridgett Larsson, vascular surgeon within one week of discharge. Patient should follow up with Dr. Erlinda Hong, neurologist within 2 months. Patient should continue his medications as prescribed. He should follow a carb modified heart healthy diet. Patient may resume physical activity as tolerated. Patient will need to take aspirin and Plavix together for 3 months thereafter only continue Plavix. Patient should also hold his metformin for 2 days.  Discharge Diagnoses:  Principal Problem:   Acute ischemic stroke Active Problems:   Atherosclerosis of native arteries of the extremities with intermittent claudication   GERD (gastroesophageal reflux disease)   Hypertension   Peripheral vascular disease   Diabetes mellitus, type 2   Obesity  Discharge Condition: Stable  Diet recommendation: Heart healthy/carb modified  Filed Weights   04/14/14 1133  Weight: 97.523 kg (215 lb)    History of present illness:  on 04/14/2014  63 year old obese left side dominant male with a history of hypertension, peripheral vascular disease, diabetes mellitus and hyperlipidemia who presented to the ED for left-sided weakness. He reported having acute onset of weakness involving both his left upper and lower limb with decreased sensation and unsteady gait . The symptoms slowly improved however he had difficulty writing and holding objects in his left hand. He denid similar symptoms in the past. Denied loss of consciousness. He spoke with his PCP and was advised to come to the ED. Patient denied headache, dizziness, fever, chills, nausea , vomiting, chest pain,  palpitations, SOB, abdominal pain, bowel or urinary symptoms, change in weight or appetite.  Hospital Course:  Acute CVA  -CT of the head:Band of hypoattenuation in the posterior right frontal lobe is consistent with ischemia  -MRI of the brain: Acute ischemic infarct within the posterior/inferior right frontal lobe as above. No significant mass effect or associated hemorrhage.  -Carotid Doppler:40-59% right ICA stenosis. 60-79% left ICA stenosis. Bilateral Vertebral artery flow is antegrade.  -Echocardiogram pending  -Neurology consulted and appreciated, recommended plavix and aspirin for 3 months, thereafter plavix alone.  -PT evaluated and patient doesnot need follow up   -LDL 66, A1c 7.2  GERD  -Continue PPI   Hypertension  -Controlled, Benicar currently held  -Allowing for permissive hypertension   Diabetes mellitus type 2  -Was placed on insulin sliding scale with CBG monitoring during hospitalization -Hemoglobin A1c 7.2 -ome medication   Obesity  -Upon discharge, patient should speak to his primary care physician regarding an exercise program and lifestyle modifications   Hyperlipidemia  -Continue statin and fenofibrate  Procedures: -Carotid Doppler:40-59% right ICA stenosis. 60-79% left ICA stenosis. Bilateral Vertebral artery flow is antegrade.   -Echocardiogram pending  Consultations: Neurology Vascular Surgery, Dr. Bridgett Larsson, via phone  Discharge Exam: Filed Vitals:   04/16/14 1033  BP: 115/72  Pulse: 81  Temp: 98.6 F (37 C)  Resp: 20   Exam  General: Well developed, well nourished, NAD, appears stated age  HEENT: NCAT, mucous membranes moist.  Cardiovascular: S1 S2 auscultated, no rubs, murmurs or gallops. Regular rate and rhythm.  Respiratory: Clear to auscultation bilaterally with equal chest rise  Abdomen: Soft, nontender, nondistended, + bowel sounds  Extremities: warm dry without cyanosis clubbing or edema  Neuro: AAOx3, no focal deficits Psych:  Normal affect and demeanor with intact judgement and insight  Discharge Instructions      Discharge Instructions   Discharge instructions    Complete by:  As directed   Patient will be discharged home. He will need followup with his primary care physician within one week. Patient should also follow up with Dr. Bridgett Larsson, vascular surgeon within one week of discharge. Patient should follow up with Dr. Erlinda Hong, neurologist within 2 months. Patient should continue his medications as prescribed. He should follow a carb modified heart healthy diet. Patient may resume physical activity as tolerated. Patient will need to take aspirin and Plavix together for 3 months thereafter only continue Plavix. Patient should also hold his metformin for 2 days.            Medication List         aspirin 81 MG tablet  Take 1 tablet (81 mg total) by mouth daily.     clopidogrel 75 MG tablet  Commonly known as:  PLAVIX  Take 1 tablet (75 mg total) by mouth daily.     fenofibrate 160 MG tablet  Take 160 mg by mouth daily.     glimepiride 1 MG tablet  Commonly known as:  AMARYL  Take 1 mg by mouth daily with breakfast.     metFORMIN 500 MG tablet  Commonly known as:  GLUCOPHAGE  Take 1 tablet (500 mg total) by mouth 3 (three) times daily.  Start taking on:  04/18/2014     olmesartan 40 MG tablet  Commonly known as:  BENICAR  Take 20 mg by mouth daily.     rosuvastatin 40 MG tablet  Commonly known as:  CRESTOR  Take 20 mg by mouth daily. For cholesterol     zolpidem 10 MG tablet  Commonly known as:  AMBIEN  Take 1 tablet (10 mg total) by mouth at bedtime as needed for sleep.       Allergies  Allergen Reactions  . Cialis [Tadalafil] Other (See Comments)    Headache   Follow-up Information   Follow up with Simona Huh, MD. Schedule an appointment as soon as possible for a visit in 1 week. [facility] follow up)    Specialty:  Family Medicine   Contact information:   [address], [place] [address] [phone]       Follow up with Hinda Lenis, MD. Schedule an appointment as soon as possible for a visit in 1 week. (Office will contact you, but please call them as well.)    Specialty:  Vascular Surgery   Contact information:   [address] [place] [address] [phone]       Follow up with Xu,Jindong, MD. Schedule an appointment as soon as possible for a visit in 2 months. [facility] followup, stroke clinic)    Specialty:  Neurology   Contact information:   [address] [place] [address] [phone]        The results of significant diagnostics from this hospitalization (including imaging, microbiology, ancillary and laboratory) are listed below for reference.    Significant Diagnostic Studies: Ct Head Wo Contrast  04/14/2014   CLINICAL DATA:  Left-sided weakness begin on Wednesday at 14:30 hr.  EXAM: CT HEAD WITHOUT CONTRAST  TECHNIQUE: Contiguous axial images were obtained from the base of the skull through the vertex without intravenous contrast.  COMPARISON:  None.  FINDINGS: There is a band of hyperdensity in  the posterior right frontal lobe on images 15 through 17. There are no prior head CTs for comparison. No evidence of mass effect on the adjacent right lateral ventricle and no definite mass effect on the adjacent sulci. Ventricles normal in size. Negative for midline shift. Negative for intra or extra-axial hemorrhage. Bilateral physiologic basal ganglia calcifications noted.  Mucosal thickening and fluid is seen in multiple ethmoid air cells. The remainder of the visualized portions of the paranasal sinuses are clear. The mastoid air cells are clear. The skull is intact. Soft tissues of the scalp and orbits are symmetric.  IMPRESSION: Band of hypoattenuation in the posterior right frontal lobe is consistent with ischemia. Given the CT appearances and the patient's left upper and lower extremity weakness which  occurred 2 days ago, this is favored to reflect an acute to subacute infarction. Critical Value/emergent results were called by telephone at the time of interpretation on 04/14/2014 at 11:55 am to Dr. Jamse Mead , who verbally acknowledged these results.   Electronically Signed   By: Curlene Dolphin M.D.   On: 04/14/2014 12:00   Ct Angio Neck W/cm &/or Wo/cm  04/16/2014   CLINICAL DATA:  Carotid stenosis.  Stroke.  EXAM: CT ANGIOGRAPHY NECK  TECHNIQUE: Multidetector CT imaging of the neck was performed using the standard protocol during bolus administration of intravenous contrast. Multiplanar CT image reconstructions and MIPs were obtained to evaluate the vascular anatomy. Carotid stenosis measurements (when applicable) are obtained utilizing NASCET criteria, using the distal internal carotid diameter as the denominator.  CONTRAST:  67mL OMNIPAQUE IOHEXOL 350 MG/ML SOLN  COMPARISON:  MR head 04/14/2014  FINDINGS: Lung apices are clear. Negative for mass or adenopathy in the neck. Mild cervical spondylosis.  Mild atherosclerotic disease in the aortic arch and proximal great vessels. Focal linear filling defect in the proximal left carotid artery felt to be atherosclerotic plaque or focal dissection  Right carotid: Atherosclerotic calcification throughout the common carotid artery without significant stenosis. Extensive atherosclerotic disease of the carotid bifurcation and carotid bulb which is largely calcified. 1 cm segment of severe focal narrowing of the internal carotid artery approximately 1 cm above the bifurcation. This narrows the lumen to 1.4 mm, corresponding to 80% diameter stenosis. Cervical internal carotid artery is patent to the skullbase. There is extensive atherosclerotic calcification in the cavernous carotid artery with moderately severe diffuse narrowing  Left carotid: Linear filling defect in the proximal left common carotid artery just above the aortic arch may represent atherosclerotic  disease or a focal dissection causing mild stenosis. Diffuse atherosclerotic disease in the common carotid artery. Calcified plaque in the distal common carotid artery just before the carotid bifurcation narrows the lumen to 2.8 mm, corresponding to 60% diameter stenosis. Atherosclerotic internal carotid artery calcification throughout the carotid bulb and narrowing the lumen by 50% diameter stenosis. Just above this area is an area of noncalcified plaque narrowing the lumen to 3.2 mm diameter corresponding to 50% diameter stenosis. Heavily calcified atherosclerotic disease throughout the cavernous carotid with moderately severe narrowing  Vertebral arteries: Mild atherosclerotic disease at the vertebral origin bilaterally without significant stenosis. Left vertebral artery is dominant. Both vertebral arteries are patent to the basilar. Small distal right vertebral artery. Mild atherosclerotic calcification in the distal vertebral artery bilaterally.  Review of the MIP images confirms the above findings.  IMPRESSION: 1 cm segment of severely stenotic internal carotid artery with luminal narrowing of 80% diameter stenosis. Moderately severe stenosis of the cavernous carotid due to heavily calcified  atherosclerotic disease.  Linear filling defect in the origin of the left common carotid artery may represent atherosclerotic plaque or focal dissection with mild stenosis. 60% diameter stenosis distal left common carotid artery. 50% diameter stenosis left internal carotid artery. Heavily calcified cavernous carotid artery with moderately severe stenosis  Mild vertebral atherosclerotic disease.   Electronically Signed   By: Franchot Gallo M.D.   On: 04/16/2014 10:28   Mr Brain Wo Contrast  04/15/2014   CLINICAL DATA:  stroke  EXAM: MRI HEAD WITHOUT CONTRAST  MRA HEAD WITHOUT CONTRAST  TECHNIQUE: Multiplanar, multiecho pulse sequences of the brain and surrounding structures were obtained without intravenous contrast.  Angiographic images of the head were obtained using MRA technique without contrast.  COMPARISON:  None.  FINDINGS: MRI HEAD FINDINGS  Focal air restricted diffusion present within the posterior inferior right frontal lobe, consistent with acute ischemic infarct. Area of infarct measures 1.3 x 1.5 x 1.6 cm. Mild associated T2/FLAIR signal intensity. No significant mass effect. No associated hemorrhage. No other acute intracranial infarct identified.  Mild diffuse prominence of the CSF containing spaces is compatible with generalized cerebral atrophy. Small remote lacunar infarcts noted within the right caudate head. There is an additional remote infarct within the right cerebellar hemisphere. Minimal patchy T2/FLAIR hyperintensity noted within the periventricular and deep white matter both cerebral hemispheres, likely related to mild chronic small vessel ischemic disease, within normal limits for patient age.  No mass lesion or midline shift. No hydrocephalus. No extra-axial fluid collection.  The pituitary gland within normal limits. No acute abnormality seen about either orbit.  Craniocervical junction within normal limits. Calvarium and scalp soft tissues are unremarkable.  Small retention cyst noted within the right maxillary sinus. Scattered mucosal thickening present within the ethmoidal air cells bilaterally. No mastoid effusion.  MRA HEAD FINDINGS  ANTERIOR CIRCULATION:  The visualized portions of the distal cervical segments of the internal carotid arteries are well opacified bilaterally with widely patent antegrade flow. Multi focal atherosclerotic irregularity seen within the cavernous segments bilaterally. There is associated mild to moderate multi focal irregular stenosis within the cavernous right ICA. There is question of a small focal 2 mm outpouching extending from the posterior aspect of the cavernous left ICA (series 5, image 74). Finding may reflect a small aneurysm. This is posteriorly directed.   The right A1 segment is hypoplastic. The left A1 segment is widely patent. Anterior cerebral arteries within normal limits.  Middle cerebral arteries including the M1 segments are widely patent without hemodynamically significant stenosis or occlusion. Distal MCA branches well opacified bilaterally.  POSTERIOR CIRCULATION:  The vertebral arteries are widely patent with antegrade flow. The posterior inferior cerebral arteries are normal. Vertebrobasilar junction and basilar artery are widely patent with antegrade flow without evidence of basilar tip stenosis or aneurysm. Posterior cerebral arteries are normal bilaterally. The superior cerebellar arteries and anterior inferior cerebellar arteries are widely patent bilaterally. No intracranial aneurysm within the posterior circulation.  IMPRESSION: MRI HEAD IMPRESSION:  1. Acute ischemic infarct within the posterior/inferior right frontal lobe as above. No significant mass effect or associated hemorrhage. 2. Remote infarcts involving the right basal ganglia and right cerebellar hemisphere. 3. Mild generalized cerebral atrophy.  MRA HEAD IMPRESSION:  1. No proximal branch occlusion or hemodynamically significant stenosis within the intracranial circulation. 2. Multi focal atherosclerotic irregularity within the cavernous ICAs bilaterally, right greater than left. 3. Question 2-3 mm focal outpouching extending from the cavernous left ICA as above. Finding is suspicious for a  possible small aneurysm.   Electronically Signed   By: Jeannine Boga M.D.   On: 04/15/2014 01:45   Mr Jodene Nam Head/brain Wo Cm  04/15/2014   CLINICAL DATA:  stroke  EXAM: MRI HEAD WITHOUT CONTRAST  MRA HEAD WITHOUT CONTRAST  TECHNIQUE: Multiplanar, multiecho pulse sequences of the brain and surrounding structures were obtained without intravenous contrast. Angiographic images of the head were obtained using MRA technique without contrast.  COMPARISON:  None.  FINDINGS: MRI HEAD FINDINGS  Focal  air restricted diffusion present within the posterior inferior right frontal lobe, consistent with acute ischemic infarct. Area of infarct measures 1.3 x 1.5 x 1.6 cm. Mild associated T2/FLAIR signal intensity. No significant mass effect. No associated hemorrhage. No other acute intracranial infarct identified.  Mild diffuse prominence of the CSF containing spaces is compatible with generalized cerebral atrophy. Small remote lacunar infarcts noted within the right caudate head. There is an additional remote infarct within the right cerebellar hemisphere. Minimal patchy T2/FLAIR hyperintensity noted within the periventricular and deep white matter both cerebral hemispheres, likely related to mild chronic small vessel ischemic disease, within normal limits for patient age.  No mass lesion or midline shift. No hydrocephalus. No extra-axial fluid collection.  The pituitary gland within normal limits. No acute abnormality seen about either orbit.  Craniocervical junction within normal limits. Calvarium and scalp soft tissues are unremarkable.  Small retention cyst noted within the right maxillary sinus. Scattered mucosal thickening present within the ethmoidal air cells bilaterally. No mastoid effusion.  MRA HEAD FINDINGS  ANTERIOR CIRCULATION:  The visualized portions of the distal cervical segments of the internal carotid arteries are well opacified bilaterally with widely patent antegrade flow. Multi focal atherosclerotic irregularity seen within the cavernous segments bilaterally. There is associated mild to moderate multi focal irregular stenosis within the cavernous right ICA. There is question of a small focal 2 mm outpouching extending from the posterior aspect of the cavernous left ICA (series 5, image 74). Finding may reflect a small aneurysm. This is posteriorly directed.  The right A1 segment is hypoplastic. The left A1 segment is widely patent. Anterior cerebral arteries within normal limits.  Middle  cerebral arteries including the M1 segments are widely patent without hemodynamically significant stenosis or occlusion. Distal MCA branches well opacified bilaterally.  POSTERIOR CIRCULATION:  The vertebral arteries are widely patent with antegrade flow. The posterior inferior cerebral arteries are normal. Vertebrobasilar junction and basilar artery are widely patent with antegrade flow without evidence of basilar tip stenosis or aneurysm. Posterior cerebral arteries are normal bilaterally. The superior cerebellar arteries and anterior inferior cerebellar arteries are widely patent bilaterally. No intracranial aneurysm within the posterior circulation.  IMPRESSION: MRI HEAD IMPRESSION:  1. Acute ischemic infarct within the posterior/inferior right frontal lobe as above. No significant mass effect or associated hemorrhage. 2. Remote infarcts involving the right basal ganglia and right cerebellar hemisphere. 3. Mild generalized cerebral atrophy.  MRA HEAD IMPRESSION:  1. No proximal branch occlusion or hemodynamically significant stenosis within the intracranial circulation. 2. Multi focal atherosclerotic irregularity within the cavernous ICAs bilaterally, right greater than left. 3. Question 2-3 mm focal outpouching extending from the cavernous left ICA as above. Finding is suspicious for a possible small aneurysm.   Electronically Signed   By: Jeannine Boga M.D.   On: 04/15/2014 01:45    Microbiology: No results found for this or any previous visit (from the past 240 hour(s)).   Labs: Basic Metabolic Panel:  Recent Labs Lab 04/14/14 1127 04/14/14  1147  NA 138 139  K 4.2 4.1  CL 101 107  CO2 23  --   GLUCOSE 161* 164*  BUN 16 15  CREATININE 0.92 1.00  CALCIUM 9.8  --    Liver Function Tests:  Recent Labs Lab 04/14/14 1127  AST 24  ALT 22  ALKPHOS 54  BILITOT 0.4  PROT 7.7  ALBUMIN 4.5   No results found for this basename: LIPASE, AMYLASE,  in the last 168 hours No results  found for this basename: AMMONIA,  in the last 168 hours CBC:  Recent Labs Lab 04/14/14 1127 04/14/14 1147  WBC 3.5*  --   NEUTROABS 1.6*  --   HGB 12.7* 12.6*  HCT 36.5* 37.0*  MCV 93.4  --   PLT 169  --    Cardiac Enzymes: No results found for this basename: CKTOTAL, CKMB, CKMBINDEX, TROPONINI,  in the last 168 hours BNP: BNP (last 3 results) No results found for this basename: PROBNP,  in the last 8760 hours CBG:  Recent Labs Lab 04/15/14 0640 04/15/14 1145 04/15/14 1643 04/15/14 2200 04/16/14 0633  GLUCAP 109* 99 149* 149* 133*    Signed:  Suha Schoenbeck  [facility] 04/16/2014, 11:50 AM

## 2014-04-16 NOTE — Progress Notes (Signed)
Judson Roch, CT spoke with Delphina @ midnight who advised that the patient had been sedated and the CTA was for in the morning. Judson Roch advised her we would need a new IV prior to CTA(existing is in the hand)  I followed up this AM @ 0545, and she is going to go ahead and start a new #20g in the [facility] so we can send for patient within the hour.

## 2014-04-16 NOTE — Evaluation (Signed)
Occupational Therapy Evaluation Patient Details Name: Jesse Avila MRN: [id number] DOB: [birth year] Today's Date: 04/16/2014    History of Present Illness 63 y.o. male with left hemiparesis (remarkably improved) since 04/12/14 and CT brain with findings suggestive of an acute/subacute right posterior frontal infarct.    Clinical Impression   Pt s/p above. OT educated on signs/symptoms of stroke and recommended to avoid canned foods. Told pt to be careful with LUE due to decreased sensation (avoid sharp/dangerous/hot surfaces). Told pt to ask MD about driving and returning to work. No further OT needs.     Follow Up Recommendations  No OT follow up    Equipment Recommendations  None recommended by OT    Recommendations for Other Services       Precautions / Restrictions Precautions Precautions: None Restrictions Weight Bearing Restrictions: No      Mobility Bed Mobility               General bed mobility comments: not assessed  Transfers Overall transfer level: Independent                    Balance                                            ADL Overall ADL's : Modified independent                                             Vision  Wears glasses all the time  Visual Fields: No apparent deficits     Tracking/Visual Pursuits: Able to track stimulus in all quads without difficulty Saccades: Within functional limits           Perception     Praxis      Pertinent Vitals/Pain Pain Assessment: No/denies pain     Hand Dominance Left   Extremity/Trunk Assessment Upper Extremity Assessment Upper Extremity Assessment: LUE deficits/detail LUE Sensation: decreased light touch   Lower Extremity Assessment Lower Extremity Assessment: Defer to PT evaluation       Communication Communication Communication: No difficulties   Cognition Arousal/Alertness: Awake/alert Behavior During Therapy: WFL for tasks  assessed/performed Overall Cognitive Status: Impaired/Different from baseline Area of Impairment: Problem solving             Problem Solving: Slow processing General Comments: family reports slow processing   General Comments       Exercises       Shoulder Instructions      Home Living Family/patient expects to be discharged to:: Private residence Living Arrangements: Spouse/significant other Available Help at Discharge: Family;Available 24 hours/day Type of Home: House Home Access: Stairs to enter [employer] of Steps: 2 Entrance Stairs-Rails: None Home Layout: One level                      Lives With: Spouse    Prior Functioning/Environment Level of Independence: Independent             OT Diagnosis:     OT Problem List:     OT Treatment/Interventions:      OT Goals(Current goals can be found in the care plan section)    OT Frequency:     Barriers to D/C:  Co-evaluation              End of Session    Activity Tolerance: Patient tolerated treatment well Patient left: in chair;with family/visitor present   Time: 1002-1017 OT Time Calculation (min): 15 min Charges:  OT General Charges $OT Visit: 1 Procedure OT Evaluation $Initial OT Evaluation Tier I: 1 Procedure G-CodesBenito Mccreedy OTR/L [phone] 04/16/2014, 12:59 PM

## 2014-04-16 NOTE — Discharge Instructions (Signed)
Stroke Prevention Some medical conditions and behaviors are associated with an increased chance of having a stroke. You may prevent a stroke by making healthy choices and managing medical conditions. HOW CAN I REDUCE MY RISK OF HAVING A STROKE?   Stay physically active. Get at least 30 minutes of activity on most or all days.  Do not smoke. It may also be helpful to avoid exposure to secondhand smoke.  Limit alcohol use. Moderate alcohol use is considered to be:  No more than 2 drinks per day for men.  No more than 1 drink per day for nonpregnant women.  Eat healthy foods. This involves:  Eating 5 or more servings of fruits and vegetables a day.  Making dietary changes that address high blood pressure (hypertension), high cholesterol, diabetes, or obesity.  Manage your cholesterol levels.  Making food choices that are high in fiber and low in saturated fat, trans fat, and cholesterol may control cholesterol levels.  Take any prescribed medicines to control cholesterol as directed by your health care provider.  Manage your diabetes.  Controlling your carbohydrate and sugar intake is recommended to manage diabetes.  Take any prescribed medicines to control diabetes as directed by your health care provider.  Control your hypertension.  Making food choices that are low in salt (sodium), saturated fat, trans fat, and cholesterol is recommended to manage hypertension.  Take any prescribed medicines to control hypertension as directed by your health care provider.  Maintain a healthy weight.  Reducing calorie intake and making food choices that are low in sodium, saturated fat, trans fat, and cholesterol are recommended to manage weight.  Stop drug abuse.  Avoid taking birth control pills.  Talk to your health care provider about the risks of taking birth control pills if you are over 7 years old, smoke, get migraines, or have ever had a blood clot.  Get evaluated for sleep  disorders (sleep apnea).  Talk to your health care provider about getting a sleep evaluation if you snore a lot or have excessive sleepiness.  Take medicines only as directed by your health care provider.  For some people, aspirin or blood thinners (anticoagulants) are helpful in reducing the risk of forming abnormal blood clots that can lead to stroke. If you have the irregular heart rhythm of atrial fibrillation, you should be on a blood thinner unless there is a good reason you cannot take them.  Understand all your medicine instructions.  Make sure that other conditions (such as anemia or atherosclerosis) are addressed. SEEK IMMEDIATE MEDICAL CARE IF:   You have sudden weakness or numbness of the face, arm, or leg, especially on one side of the body.  Your face or eyelid droops to one side.  You have sudden confusion.  You have trouble speaking (aphasia) or understanding.  You have sudden trouble seeing in one or both eyes.  You have sudden trouble walking.  You have dizziness.  You have a loss of balance or coordination.  You have a sudden, severe headache with no known cause.  You have new chest pain or an irregular heartbeat. Any of these symptoms may represent a serious problem that is an emergency. Do not wait to see if the symptoms will go away. Get medical help at once. Call your local emergency services (911 in [place] Do not drive yourself to the hospital. Document Released: 08/28/2004 Document Revised: 12/05/2013 Document Reviewed: 01/21/2013 [facility] Patient Information 2015 [place], [place]. This information is not intended to replace advice given  to you by your health care provider. Make sure you discuss any questions you have with your health care provider. Ischemic Stroke A stroke (cerebrovascular accident) is the sudden death of brain tissue. It is a medical emergency. A stroke can cause permanent loss of brain function. This can cause problems with different  parts of your body. A transient ischemic attack (TIA) is different because it does not cause permanent damage. A TIA is a short-lived problem of poor blood flow affecting a part of the brain. A TIA is also a serious problem because having a TIA greatly increases the chances of having a stroke. When symptoms first develop, you cannot know if the problem might be a stroke or a TIA. CAUSES  A stroke is caused by a decrease of oxygen supply to an area of your brain. It is usually the result of a small blood clot or collection of cholesterol or fat (plaque) that blocks blood flow in the brain. A stroke can also be caused by blocked or damaged carotid arteries.  RISK FACTORS  High blood pressure (hypertension).  High cholesterol.  Diabetes mellitus.  Heart disease.  The buildup of plaque in the blood vessels (peripheral artery disease or atherosclerosis).  The buildup of plaque in the blood vessels providing blood and oxygen to the brain (carotid artery stenosis).  An abnormal heart rhythm (atrial fibrillation).  Obesity.  Smoking.  Taking oral contraceptives (especially in combination with smoking).  Physical inactivity.  A diet high in fats, salt (sodium), and calories.  Alcohol use.  Use of illegal drugs (especially cocaine and methamphetamine).  Being African American.  Being over the age of 48.  Family history of stroke.  Previous history of blood clots, stroke, TIA, or heart attack.  Sickle cell disease. SYMPTOMS  These symptoms usually develop suddenly, or may be newly present upon awakening from sleep:  Sudden weakness or numbness of the face, arm, or leg, especially on one side of the body.  Sudden trouble walking or difficulty moving arms or legs.  Sudden confusion.  Sudden personality changes.  Trouble speaking (aphasia) or understanding.  Difficulty swallowing.  Sudden trouble seeing in one or both eyes.  Double vision.  Dizziness.  Loss of balance  or coordination.  Sudden severe headache with no known cause.  Trouble reading or writing. DIAGNOSIS  Your health care provider can often determine the presence or absence of a stroke based on your symptoms, history, and physical exam. Computed tomography (CT) of the brain is usually performed to confirm the stroke, determine causes, and determine stroke severity. Other tests may be done to find the cause of the stroke. These tests may include:  Electrocardiography.  Continuous heart monitoring.  Echocardiography.  Carotid ultrasonography.  Magnetic resonance imaging (MRI).  A scan of the brain circulation.  Blood tests. PREVENTION  The risk of a stroke can be decreased by appropriately treating high blood pressure, high cholesterol, diabetes, heart disease, and obesity and by quitting smoking, limiting alcohol, and staying physically active. TREATMENT  Time is of the essence. It is important to seek treatment at the first sign of these symptoms because you may receive a medicine to dissolve the clot (thrombolytic) that cannot be given if too much time has passed since your symptoms began. Even if you do not know when your symptoms began, get treatment as soon as possible as there are other treatment options available including oxygen, intravenous (IV) fluids, and medicines to thin the blood (anticoagulants). Treatment of stroke depends  on the duration, severity, and cause of your symptoms. Medicines and dietary changes may be used to address diabetes, high blood pressure, and other risk factors. Physical, speech, and occupational therapists will assess you and work with you to improve any functions impaired by the stroke. Measures will be taken to prevent short-term and long-term complications, including infection from breathing foreign material into the lungs (aspiration pneumonia), blood clots in the legs, bedsores, and falls. Rarely, surgery may be needed to remove large blood clots or to  open up blocked arteries. HOME CARE INSTRUCTIONS   Take medicines only as directed by your health care provider. Follow the directions carefully. Medicines may be used to control risk factors for a stroke. Be sure you understand all your medicine instructions.  You may be told to take a medicine to thin the blood, such as aspirin or the anticoagulant warfarin. Warfarin needs to be taken exactly as instructed.  Too much and too little warfarin are both dangerous. Too much warfarin increases the risk of bleeding. Too little warfarin continues to allow the risk for blood clots. While taking warfarin, you will need to have regular blood tests to measure your blood clotting time. These blood tests usually include both the PT and INR tests. The PT and INR results allow your health care provider to adjust your dose of warfarin. The dose can change for many reasons. It is critically important that you take warfarin exactly as prescribed, and that you have your PT and INR levels drawn exactly as directed.  Many foods, especially foods high in vitamin K, can interfere with warfarin and affect the PT and INR results. Foods high in vitamin K include spinach, kale, broccoli, cabbage, collard and turnip greens, brussels sprouts, peas, cauliflower, seaweed, and parsley, as well as beef and pork liver, green tea, and soybean oil. You should eat a consistent amount of foods high in vitamin K. Avoid major changes in your diet, or notify your health care provider before changing your diet. Arrange a visit with a dietitian to answer your questions.  Many medicines can interfere with warfarin and affect the PT and INR results. You must tell your health care provider about any and all medicines you take. This includes all vitamins and supplements. Be especially cautious with aspirin and anti-inflammatory medicines. Do not take or discontinue any prescribed or over-the-counter medicine except on the advice of your health care  provider or pharmacist.  Warfarin can have side effects, such as excessive bruising or bleeding. You will need to hold pressure over cuts for longer than usual. Your health care provider or pharmacist will discuss other potential side effects.  Avoid sports or activities that may cause injury or bleeding.  Be mindful when shaving, flossing your teeth, or handling sharp objects.  Alcohol can change the body's ability to handle warfarin. It is best to avoid alcoholic drinks or consume only very small amounts while taking warfarin. Notify your health care provider if you change your alcohol intake.  Notify your dentist or other health care providers before procedures.  If swallow studies have determined that your swallowing reflex is present, you should eat healthy foods. Including 5 or more servings of fruits and vegetables a day may reduce the risk of stroke. Foods may need to be a certain consistency (soft or pureed), or small bites may need to be taken in order to avoid aspirating or choking. Certain dietary changes may be advised to address high blood pressure, high cholesterol, diabetes, or obesity.  Food choices that are low in sodium, saturated fat, trans fat, and cholesterol are recommended to manage high blood pressure.  Food choies that are high in fiber, and low in saturated fat, trans fat, and cholesterol may control cholesterol levels.  Controlling carbohydrates and sugar intake is recommended to manage diabetes.  Reducing calorie intake and making food choices that are low in sodium, saturated fat, trans fat, and cholesterol are recommended to manage obesity.  Maintain a healthy weight.  Stay physically active. It is recommended that you get at least 30 minutes of activity on all or most days.  Do not use any tobacco products including cigarettes, chewing tobacco, or electronic cigarettes.  Limit alcohol use even if you are not taking warfarin. Moderate alcohol use is  considered to be:  No more than 2 drinks each day for men.  No more than 1 drink each day for nonpregnant women.  Home safety. A safe home environment is important to reduce the risk of falls. Your health care provider may arrange for specialists to evaluate your home. Having grab bars in the bedroom and bathroom is often important. Your health care provider may arrange for equipment to be used at home, such as raised toilets and a seat for the shower.  Physical, occupational, and speech therapy. Ongoing therapy may be needed to maximize your recovery after a stroke. If you have been advised to use a walker or a cane, use it at all times. Be sure to keep your therapy appointments.  Follow all instructions for follow-up with your health care provider. This is very important. This includes any referrals, physical therapy, rehabilitation, and lab tests. Proper follow-up can prevent another stroke from occurring. SEEK MEDICAL CARE IF:  You have personality changes.  You have difficulty swallowing.  You are seeing double.  You have dizziness.  You have a fever.  You have skin breakdown. SEEK IMMEDIATE MEDICAL CARE IF:  Any of these symptoms may represent a serious problem that is an emergency. Do not wait to see if the symptoms will go away. Get medical help right away. Call your local emergency services (911 in [place] Do not drive yourself to the hospital.  You have sudden weakness or numbness of the face, arm, or leg, especially on one side of the body.  You have sudden trouble walking or difficulty moving arms or legs.  You have sudden confusion.  You have trouble speaking (aphasia) or understanding.  You have sudden trouble seeing in one or both eyes.  You have a loss of balance or coordination.  You have a sudden, severe headache with no known cause.  You have new chest pain or an irregular heartbeat.  You have a partial or total loss of consciousness. Document Released:  07/21/2005 Document Revised: 12/05/2013 Document Reviewed: 02/29/2012 [facility] Patient Information 2015 [place], [place]. This information is not intended to replace advice given to you by your health care provider. Make sure you discuss any questions you have with your health care provider.    Work Note:   To whom it concern:   [name]  was admitted to the Hospital from 04/14/2014 to 04/16/2014 and was discharged on 04/16/2014. [name] should be excused from work starting9/06/2014 and may return to work on light duty for one week.  Call [facility] office [phone] for any questions.  Sincerely,   Cristal Ford, DO [facility] 04/16/2014, 11:50 AM

## 2014-04-16 NOTE — Progress Notes (Signed)
Patient is been d/c home, d/c instruction given. Patient verbalized understanding.

## 2014-04-17 ENCOUNTER — Telehealth: Payer: Self-pay | Admitting: Vascular Surgery

## 2014-04-17 NOTE — Progress Notes (Signed)
RETRO REVIEW/ UR COMPLETED

## 2014-04-17 NOTE — ED Provider Notes (Signed)
Medical screening examination/treatment/procedure(s) were conducted as a shared visit with non-physician practitioner(s) and myself.  I personally evaluated the patient during the encounter.   EKG Interpretation   Date/Time:  Friday April 14 2014 11:22:42 EDT Ventricular Rate:  86 PR Interval:  211 QRS Duration: 104 QT Interval:  378 QTC Calculation: 452 R Axis:   55 Text Interpretation:  Sinus rhythm Ventricular premature complex Prolonged  PR interval Anterior infarct, old ED PHYSICIAN INTERPRETATION AVAILABLE IN  [facility] Confirmed by TEST, Record ([id number]) on 04/16/2014 1:09:56 PM      63 yo male with left sided weakness for several days.  On exam, well appearing, nontoxic, not distressed, normal respiratory effort, normal perfusion, mild flattening of left NLF, decreased strength in LUE and LLE.  CT reveals subacute infarct.  Admitted to [facility] for stroke eval.  Code stroke not call and no tPA given due to time since onset of symptoms..  Clinical Impression: 1. Stroke   2. Left hemiparesis   3. Acute ischemic stroke     Houston Siren III, MD 04/17/14 [phone]

## 2014-04-17 NOTE — ED Provider Notes (Signed)
Medical screening examination/treatment/procedure(s) were conducted as a shared visit with non-physician practitioner(s) and myself.  I personally evaluated the patient during the encounter.   EKG Interpretation   Date/Time:  Friday April 14 2014 11:22:42 EDT Ventricular Rate:  86 PR Interval:  211 QRS Duration: 104 QT Interval:  378 QTC Calculation: 452 R Axis:   55 Text Interpretation:  Sinus rhythm Ventricular premature complex Prolonged  PR interval Anterior infarct, old ED PHYSICIAN INTERPRETATION AVAILABLE IN  [facility] Confirmed by TEST, Record ([id number]) on 04/16/2014 1:09:56 PM        Houston Siren III, MD 04/17/14 [phone]

## 2014-04-17 NOTE — Telephone Encounter (Signed)
Spoke with wife to schedule, dpm

## 2014-04-17 NOTE — Telephone Encounter (Signed)
Message copied by Gena Fray on Mon Apr 17, 2014 10:38 AM ------      Message from: Peter Minium K      Created: Mon Apr 17, 2014 10:01 AM      Regarding: Schedule                   ----- Message -----         From: Conrad [place], MD         Sent: 04/16/2014  11:36 AM           To: [address]            ZIAIR PENSON      [id number]      Nov 10, 1950            Schedule patient for this Friday's clinic R ICA stenosis s/p CVA ------

## 2014-04-20 ENCOUNTER — Encounter: Payer: Self-pay | Admitting: Vascular Surgery

## 2014-04-21 ENCOUNTER — Ambulatory Visit (INDEPENDENT_AMBULATORY_CARE_PROVIDER_SITE_OTHER): Payer: 59 | Admitting: Vascular Surgery

## 2014-04-21 ENCOUNTER — Encounter: Payer: Self-pay | Admitting: Vascular Surgery

## 2014-04-21 VITALS — BP 117/71 | HR 79 | Ht 72.0 in | Wt 212.0 lb

## 2014-04-21 DIAGNOSIS — I63239 Cerebral infarction due to unspecified occlusion or stenosis of unspecified carotid arteries: Secondary | ICD-10-CM

## 2014-04-21 DIAGNOSIS — I63131 Cerebral infarction due to embolism of right carotid artery: Secondary | ICD-10-CM

## 2014-04-21 DIAGNOSIS — I6529 Occlusion and stenosis of unspecified carotid artery: Secondary | ICD-10-CM

## 2014-04-21 DIAGNOSIS — Z8673 Personal history of transient ischemic attack (TIA), and cerebral infarction without residual deficits: Secondary | ICD-10-CM | POA: Insufficient documentation

## 2014-04-21 NOTE — Progress Notes (Signed)
New Carotid Patient  Referred by:  [name], MD [place], [place], [place] [address]  Reason for referral: Right carotid stenosis and right sided stroke  History of Present Illness  Jesse Avila is a 63 y.o. ([birth year]) male who presents with chief complaint: stroke.  The patient recently had weakness and discoordination in his L leg first then L arm, on  04/12/14.  He then had residual sensation changes in L arm the next day so he went to the hospital.  MRI on 04/14/14 demonstrates:   1. Acute ischemic infarct within the posterior/inferior right frontal lobe as above. No significant mass effect or associated hemorrhage.  2. Remote infarcts involving the right basal ganglia and right cerebellar hemisphere.  3. Mild generalized cerebral atrophy.  CTA demonstrated: 1. 1 cm segment of severely stenotic internal carotid artery with luminal narrowing of 80% diameter stenosis. Moderately severe stenosis of the cavernous carotid due to heavily calcified atherosclerotic disease.  2. Linear filling defect in the origin of the left common carotid artery may represent atherosclerotic plaque or focal dissection with mild stenosis. 60% diameter stenosis distal left common carotid artery. 50% diameter stenosis left internal carotid artery. Heavily calcified cavernous carotid artery with moderately severe stenosis  3. Mild vertebral atherosclerotic disease.   Patient has no prior history of TIA or stroke symptom.  The patient has never had amaurosis fugax or monocular blindness.  The patient has never had facial drooping or hemiplegia.  He had L hand and leg weakness.  The patient has never had receptive or expressive aphasia.   The patient's previous neurologic deficits have resolved except for altered temperature sensation in left arm.  The patient's risks factors for carotid disease include: HLD, DM, HTN, and prior smoking.  Past Medical History  Diagnosis Date  . Hyperlipidemia    . Peripheral vascular disease     With moderate Claudication  . Hypertension   . Diabetes mellitus 8/20012    Type II  . GERD (gastroesophageal reflux disease)   . Arthritis     Gout  . ED (erectile dysfunction)   . 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 Joint replacement  11/2009    Right knee arthroscopy  . Meniscus repair  11/2009    History   Social History  . Marital Status: Married    Spouse Name: N/A    Number of Children: N/A  . Years of Education: N/A   Occupational History  . Not on file.   Social History Main Topics  . Smoking status: Former Smoker -- 1.00 packs/day for 30 years    Types: Cigarettes  . Smokeless tobacco: Never Used  . Alcohol Use: 7.2 oz/week    12 Cans of beer per week     Comment: WEEKENDS AND DURING WEEK  . Drug Use: No  . Sexual Activity: No   Other Topics Concern  . Not on file   Social History Narrative  . No narrative on file    Family History  Problem Relation Age of Onset  . Diabetes Mother   . Hypertension Mother   . Heart disease Mother     Coronary Artery Bypass Graft  . Hyperlipidemia Mother   . Heart attack Mother   . Hypertension Father   . Hyperlipidemia Sister   . Stroke Sister     Current Outpatient Prescriptions on File Prior  to Visit  Medication Sig Dispense Refill  . aspirin 81 MG tablet Take 1 tablet (81 mg total) by mouth daily.  30 tablet    . clopidogrel (PLAVIX) 75 MG tablet Take 1 tablet (75 mg total) by mouth daily.  30 tablet  0  . fenofibrate 160 MG tablet Take 160 mg by mouth daily.      Marland Kitchen glimepiride (AMARYL) 1 MG tablet Take 1 mg by mouth daily with breakfast.       . metFORMIN (GLUCOPHAGE) 500 MG tablet Take 1 tablet (500 mg total) by mouth 3 (three) times daily.      Marland Kitchen olmesartan (BENICAR) 40 MG tablet Take 20 mg by mouth daily.       . rosuvastatin (CRESTOR) 40 MG tablet Take 20 mg by mouth daily. For cholesterol       . zolpidem (AMBIEN) 10 MG tablet Take 1 tablet (10 mg total) by mouth at bedtime as needed for sleep.  30 tablet  0   No current facility-administered medications on file prior to visit.    Allergies  Allergen Reactions  . Cialis [Tadalafil] Other (See Comments)    Headache    REVIEW OF SYSTEMS:  (Positives checked otherwise negative)  CARDIOVASCULAR:  []  chest pain, []  chest pressure, []  palpitations, []  shortness of breath when laying flat, []  shortness of breath with exertion,  []  pain in feet when walking, []  pain in feet when laying flat, []  history of blood clot in veins (DVT), []  history of phlebitis, []  swelling in legs, []  varicose veins  PULMONARY:  []  productive cough, []  asthma, []  wheezing  NEUROLOGIC:  []  weakness in arms or legs, []  numbness in arms or legs, []  difficulty speaking or slurred speech, []  temporary loss of vision in one eye, []  dizziness  HEMATOLOGIC:  []  bleeding problems, []  problems with blood clotting too easily  MUSCULOSKEL:  []  joint pain, []  joint swelling  GASTROINTEST:  []  vomiting blood, []  blood in stool     GENITOURINARY:  []  burning with urination, []  blood in urine  PSYCHIATRIC:  []  history of major depression  INTEGUMENTARY:  []  rashes, []  ulcers  CONSTITUTIONAL:  []  fever, []  chills  For VQI Use Only  PRE-ADM LIVING: Home  AMB STATUS: Ambulatory  CAD Sx: None  PRIOR CHF: None  STRESS TEST: [x]  No, [ ]  Normal, [ ]  + ischemia, [ ]  + MI, [ ]  Both   Physical Examination  Filed Vitals:   04/21/14 0927 04/21/14 0928  BP: 117/72 117/71  Pulse: 79   Height: 6' (1.829 m)   Weight: 212 lb (96.163 kg)   SpO2: 98%    Body mass index is 28.75 kg/(m^2).  General: A&O x 3, WDWN  Head: [place]/AT  Ear/Nose/Throat: Hearing grossly intact, nares w/o erythema or drainage, oropharynx w/o Erythema/Exudate, Mallampati score: 3  Eyes: PERRLA, EOMI  Neck: Supple, no nuchal rigidity, no palpable LAD  Pulmonary: Sym exp, good air  movt, CTAB, no rales, rhonchi, & wheezing  Cardiac: RRR, Nl S1, S2, no Murmurs, rubs or gallops  Vascular: Vessel Right Left  Radial Palpable Palpable  Brachial Palpable Palpable  Carotid Palpable, without bruit Palpable, without bruit  Aorta  Not palpable N/A  Femoral Palpable Palpable  Popliteal Not palpable Not palpable  PT Not Palpable Not Palpable  DP Palpable Palpable   Gastrointestinal: soft, NTND, -G/R, - HSM, - masses, - CVAT B  Musculoskeletal: M/S 5/5 throughout , Extremities without ischemic changes   Neurologic: CN  2-12 intact , Pain and light touch intact in extremities , Motor exam as listed above  Psychiatric: Judgment intact, Mood & affect appropriate for pt's clinical situation  Dermatologic: See M/S exam for extremity exam, no rashes otherwise noted  Lymph : No Cervical, Axillary, or Inguinal lymphadenopathy   CTA Neck (04/14/14)   1 cm segment of severely stenotic internal carotid artery with luminal narrowing of 80% diameter stenosis. Moderately severe stenosis of the cavernous carotid due to heavily calcified atherosclerotic disease.   Linear filling defect in the origin of the left common carotid artery may represent atherosclerotic plaque or focal dissection with mild stenosis. 60% diameter stenosis distal left common carotid artery. 50% diameter stenosis left internal carotid artery. Heavily calcified cavernous carotid artery with moderately severe stenosis   Mild vertebral atherosclerotic disease.  Based on my review of this patient's CTA, he has >50% stenosis in RICA which is surgically accessible.  Outside Studies/Documentation 10 pages of outside documents were reviewed including: inpatient progress notes.  Medical Decision Making  Jesse Avila is a 63 y.o. male who presents with: R CVA, sx ICA stenosis >80%., asx LICA<80%   Based on the patient's vascular studies and examination, I have offered the patient: R CEA.  Anesthesiology will want  cardiac evaluation preop, so consultation has been offered.  I routinely delay CEA after CVA for at least 2 weeks to avoid extending the extent of infarction, so he will be scheduled after preop Cardiac risk stratification and optimization is arranged  I discussed in depth with the patient the nature of atherosclerosis, and emphasized the importance of maximal medical management including strict control of blood pressure, blood glucose, and lipid levels, obtaining regular exercise, antiplatelet agents, and cessation of smoking.   The patient is currently on a statin: Crestor.  The patient is currently on an anti-platelet: ASA and plavix.  The patient is aware that without maximal medical management the underlying atherosclerotic disease process will progress, limiting the benefit of any interventions.  He will follow up in 2 weeks for preop counseling  Thank you for allowing [place] to participate in this patient's care.  Adele Barthel, MD [facility] Office: [phone] Pager: [phone]  04/21/2014, 10:25 AM

## 2014-04-28 ENCOUNTER — Ambulatory Visit (INDEPENDENT_AMBULATORY_CARE_PROVIDER_SITE_OTHER): Payer: 59 | Admitting: Cardiology

## 2014-04-28 ENCOUNTER — Telehealth: Payer: Self-pay | Admitting: Cardiology

## 2014-04-28 ENCOUNTER — Encounter: Payer: Self-pay | Admitting: Cardiology

## 2014-04-28 ENCOUNTER — Encounter: Payer: Self-pay | Admitting: *Deleted

## 2014-04-28 VITALS — BP 125/72 | HR 92 | Ht 72.0 in | Wt 212.4 lb

## 2014-04-28 DIAGNOSIS — I70219 Atherosclerosis of native arteries of extremities with intermittent claudication, unspecified extremity: Secondary | ICD-10-CM

## 2014-04-28 DIAGNOSIS — Z8673 Personal history of transient ischemic attack (TIA), and cerebral infarction without residual deficits: Secondary | ICD-10-CM

## 2014-04-28 DIAGNOSIS — I10 Essential (primary) hypertension: Secondary | ICD-10-CM

## 2014-04-28 DIAGNOSIS — Z01810 Encounter for preprocedural cardiovascular examination: Secondary | ICD-10-CM

## 2014-04-28 DIAGNOSIS — E785 Hyperlipidemia, unspecified: Secondary | ICD-10-CM

## 2014-04-28 NOTE — Telephone Encounter (Signed)
Lexiscan Cardiolite at [facility]  dx:Pre-op and CAD Schedule 9/30 @ 7am

## 2014-04-28 NOTE — Assessment & Plan Note (Signed)
Recent stroke earlier this month as outlined above. He is on DAPT.

## 2014-04-28 NOTE — Assessment & Plan Note (Signed)
Patient being considered for elective right CEA by Dr. Bridgett Larsson following recent stroke with documentation 80% RICA stenosis by CTA. Patient has well-documented peripheral arterial disease, and therefore likely has some degree of coronary atherosclerosis at baseline. He does not report any angina or obvious CHF symptoms, recent echocardiogram showed normal LVEF. Plan is to further risk stratify with a Lexiscan Cardiolite, this will be arranged for early next week. Patient follows up with Dr. Bridgett Larsson later in the week, and we will forward final recommendations to him.

## 2014-04-28 NOTE — Patient Instructions (Signed)
Your physician has requested that you have a lexiscan myoview. For further information please visit [email or web address]. Please follow instruction sheet, as given. Your physician recommends that you continue on your current medications as directed. Please refer to the Current Medication list given to you today. We will call your with your results.

## 2014-04-28 NOTE — Assessment & Plan Note (Signed)
Patient states he follows with Dr. Marijean Bravo for this. He is on antiplatelet therapy and Pletal.

## 2014-04-28 NOTE — Progress Notes (Signed)
Clinical Summary Jesse Avila is a 63 y.o.male referred for cardiology consultation by Dr. Marisue Humble. Review of records in [place] finds that the patient was just recently evaluated in the hospital earlier this month with an acute stroke involving the posterior/inferior right frontal lobe. He had no evidence of cardiac arrhythmias, was treated with aspirin and Plavix by Neurology. He also underwent carotid Dopplers showing 40-59% RICA stenosis, and 11-91% LICA stenosis. Dr. Bridgett Larsson was contacted and saw the patient on September 18. CTA demonstrated RICA stenosis of 80% and patient is being considered for CEA.  He reports no angina symptoms, NYHA class II dyspnea. Main limitation is bilateral leg claudication at 75 feet. He follows with a Dr. Marijean Bravo for this. He has not undergone any object ischemic testing, although I did find a prior note from Dr. Johnsie Cancel back in 2013 for assessment of PAD.  Echocardiogram from September 12 reported LVEF 55-60% with normal LV wall thickness, grade 1 diastolic dysfunction, mild left atrial enlargement, no valvular abnormalities, hypermobile interatrial septum with no obvious PFO or ASD.  Recent lab work showed cholesterol 147, triglycerides 261, HDL 29, LDL 66, hemoglobin A1c 7.2, hemoglobin 12.7, platelets 169, potassium 4.2, BUN 16, creatinine 0.9, ECG reviewed from September 11 showing sinus rhythm with IVCD, fusion beat, rule out old anterior infarct pattern.   Allergies  Allergen Reactions  . Cialis [Tadalafil] Other (See Comments)    Headache    Current Outpatient Prescriptions  Medication Sig Dispense Refill  . aspirin 81 MG tablet Take 1 tablet (81 mg total) by mouth daily.  30 tablet    . cilostazol (PLETAL) 100 MG tablet Take 100 mg by mouth 2 (two) times daily.      . clopidogrel (PLAVIX) 75 MG tablet Take 1 tablet (75 mg total) by mouth daily.  30 tablet  0  . fenofibrate 160 MG tablet Take 160 mg by mouth daily.      Marland Kitchen glimepiride (AMARYL) 1 MG tablet  Take 1 mg by mouth daily with breakfast.       . metFORMIN (GLUCOPHAGE) 500 MG tablet Take 1 tablet (500 mg total) by mouth 3 (three) times daily.      Marland Kitchen olmesartan (BENICAR) 40 MG tablet Take 20 mg by mouth daily.       . rosuvastatin (CRESTOR) 40 MG tablet Take 20 mg by mouth daily. For cholesterol      . zolpidem (AMBIEN) 10 MG tablet Take 1 tablet (10 mg total) by mouth at bedtime as needed for sleep.  30 tablet  0   No current facility-administered medications for this visit.    Past Medical History  Diagnosis Date  . Hyperlipidemia   . Peripheral vascular disease   . Essential hypertension, benign   . 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 GERD (gastroesophageal reflux disease)   . Gout   . ED (erectile dysfunction)   . DJD (degenerative joint disease) of knee   . Carotid artery occlusion   . History of stroke     Posterior/inferior right frontal lobe     Past Surgical History  Procedure Laterality Date  . Knee arthroscopy Right 11/2009  . Meniscus repair  11/2009    Family History  Problem Relation Age of Onset  . Diabetes Mother   . Hypertension Mother   . Heart disease Mother     Coronary Artery Bypass Graft  . Hyperlipidemia Mother   . Heart  attack Mother   . Hypertension Father   . Hyperlipidemia Sister   . Stroke Sister     Social History Mr. Frayne reports that he quit smoking about 4 years ago. His smoking use included Cigarettes. He has a 30 pack-year smoking history. He has never used smokeless tobacco. Mr. Barret reports that he drinks about 7.2 ounces of alcohol per week.  Review of Systems No palpitations or syncope. No orthopnea or PND. Stable appetite. Trouble with "corns" on his left foot. Other systems reviewed and negative except as outlined.  Physical Examination Filed Vitals:   04/28/14 0925  BP: 125/72  Pulse: 92   Filed Weights   04/28/14 0925  Weight: 212 lb  6.4 oz (96.344 kg)   Patient appears comfortable at rest. HEENT: Conjunctiva and lids normal, oropharynx clear. Neck: Supple, no elevated JVP, right carotid bruit, no thyromegaly. Lungs: Clear to auscultation, nonlabored breathing at rest. Cardiac: Regular rate and rhythm, no S3 or significant systolic murmur, no pericardial rub. Abdomen: Soft, nontender, no bruit, bowel sounds present. Extremities: No pitting edema, distal pulses 1+. Skin: Warm and dry. Musculoskeletal: No kyphosis. Neuropsychiatric: Alert and oriented x3, affect grossly appropriate.   Problem List and Plan   Preop cardiovascular exam Patient being considered for elective right CEA by Dr. Bridgett Larsson following recent stroke with documentation 80% RICA stenosis by CTA. Patient has well-documented peripheral arterial disease, and therefore likely has some degree of coronary atherosclerosis at baseline. He does not report any angina or obvious CHF symptoms, recent echocardiogram showed normal LVEF. Plan is to further risk stratify with a Lexiscan Cardiolite, this will be arranged for early next week. Patient follows up with Dr. Bridgett Larsson later in the week, and we will forward final recommendations to him.  Atherosclerosis of native arteries of the extremities with intermittent claudication Patient states he follows with Dr. Marijean Bravo for this. He is on antiplatelet therapy and Pletal.  Hyperlipidemia On Crestor, recent LDL 66.  Essential hypertension, benign Blood pressure well controlled today.  History of stroke Recent stroke earlier this month as outlined above. He is on DAPT.    Satira Sark, M.D., F.A.C.C.

## 2014-04-28 NOTE — Assessment & Plan Note (Signed)
On Crestor, recent LDL 66.

## 2014-04-28 NOTE — Assessment & Plan Note (Signed)
Blood pressure well-controlled today. 

## 2014-04-28 NOTE — Telephone Encounter (Signed)
[employer] FXTK#[id number] EXP 06-12-14 [facility]

## 2014-05-03 ENCOUNTER — Encounter (HOSPITAL_COMMUNITY)
Admission: RE | Admit: 2014-05-03 | Discharge: 2014-05-03 | Disposition: A | Discharge status: Home / Self Care | Payer: 59 | Source: Ambulatory Visit | Attending: Cardiology | Admitting: Cardiology

## 2014-05-03 ENCOUNTER — Ambulatory Visit (HOSPITAL_COMMUNITY)
Admission: RE | Admit: 2014-05-03 | Discharge: 2014-05-03 | Disposition: A | Discharge status: Home / Self Care | Payer: 59 | Source: Ambulatory Visit | Attending: Cardiology | Admitting: Cardiology

## 2014-05-03 ENCOUNTER — Encounter (HOSPITAL_COMMUNITY): Payer: Self-pay

## 2014-05-03 ENCOUNTER — Telehealth: Payer: Self-pay | Admitting: *Deleted

## 2014-05-03 DIAGNOSIS — E785 Hyperlipidemia, unspecified: Secondary | ICD-10-CM | POA: Insufficient documentation

## 2014-05-03 DIAGNOSIS — Z01810 Encounter for preprocedural cardiovascular examination: Secondary | ICD-10-CM | POA: Insufficient documentation

## 2014-05-03 DIAGNOSIS — I10 Essential (primary) hypertension: Secondary | ICD-10-CM | POA: Diagnosis not present

## 2014-05-03 DIAGNOSIS — R42 Dizziness and giddiness: Secondary | ICD-10-CM | POA: Insufficient documentation

## 2014-05-03 DIAGNOSIS — I5189 Other ill-defined heart diseases: Secondary | ICD-10-CM | POA: Diagnosis not present

## 2014-05-03 DIAGNOSIS — I70219 Atherosclerosis of native arteries of extremities with intermittent claudication, unspecified extremity: Secondary | ICD-10-CM | POA: Insufficient documentation

## 2014-05-03 DIAGNOSIS — E119 Type 2 diabetes mellitus without complications: Secondary | ICD-10-CM | POA: Insufficient documentation

## 2014-05-03 DIAGNOSIS — I739 Peripheral vascular disease, unspecified: Secondary | ICD-10-CM | POA: Diagnosis not present

## 2014-05-03 MED ORDER — TECHNETIUM TC 99M SESTAMIBI - CARDIOLITE
10.0000 | Freq: Once | INTRAVENOUS | Status: AC | PRN
  Administered 2014-05-03: 07:00:00 10 via INTRAVENOUS

## 2014-05-03 MED ORDER — TECHNETIUM TC 99M SESTAMIBI GENERIC - CARDIOLITE
30.0000 | Freq: Once | INTRAVENOUS | Status: AC | PRN
  Administered 2014-05-03: 30 via INTRAVENOUS

## 2014-05-03 MED ORDER — REGADENOSON 0.4 MG/5ML IV SOLN
INTRAVENOUS | Status: AC
  Administered 2014-05-03: 0.4 mg
  Filled 2014-05-03: qty 5

## 2014-05-03 MED ORDER — SODIUM CHLORIDE 0.9 % IJ SOLN
INTRAMUSCULAR | Status: AC
  Administered 2014-05-03: 10 mL
  Filled 2014-05-03: qty 10

## 2014-05-03 NOTE — Telephone Encounter (Signed)
Message copied by Merlene Laughter on Wed May 03, 2014  3:54 PM ------      Message from: MCDOWELL, Aloha Gell      Created: Wed May 03, 2014 10:59 AM       Reviewed. Overall low risk study, consistent with underlying coronary atherosclerosis which was suspected based on risk factors. He should be able to go ahead with CEA at an acceptable perioperative cardiac risk. Please forward this appended report to Dr. Bridgett Larsson and Dr. Marisue Humble. ------

## 2014-05-03 NOTE — Telephone Encounter (Signed)
Patient informed. 

## 2014-05-03 NOTE — Progress Notes (Signed)
Stress Lab Nurses Notes - Dennys Guin 05/03/2014 Reason for doing test: Surgical Clearance Type of test: Lexiscan/Cardiolite Nurse performing test: Benay Pillow Rn Nuclear Medicine Tech: Redmond Baseman Echo Tech: Not Applicable MD performing test: S. McDowell/K Hessie Diener Family MD: Dr Marisue Humble Test explained and consent signed: Yes.   IV started: Saline lock started in radiology Symptoms: lightheadedness Treatment/Intervention: None Reason test stopped: protocol completed After recovery IV was: Discontinued via X-ray tech Patient to return to [facility]. Med at : 0910 Patient discharged: Home Patient's Condition upon discharge was: stable Comments: Peak bp 141/66, HR104, recovery bp 114/66, HR 86. Symptoms resolved Dyanna Seiter M

## 2014-05-04 ENCOUNTER — Encounter: Payer: Self-pay | Admitting: Vascular Surgery

## 2014-05-05 ENCOUNTER — Other Ambulatory Visit: Payer: Self-pay

## 2014-05-05 ENCOUNTER — Ambulatory Visit (INDEPENDENT_AMBULATORY_CARE_PROVIDER_SITE_OTHER): Payer: 59 | Admitting: Vascular Surgery

## 2014-05-05 ENCOUNTER — Encounter (HOSPITAL_COMMUNITY): Payer: Self-pay | Admitting: Pharmacy Technician

## 2014-05-05 ENCOUNTER — Encounter: Payer: Self-pay | Admitting: Vascular Surgery

## 2014-05-05 VITALS — BP 114/69 | HR 93 | Ht 72.0 in | Wt 215.0 lb

## 2014-05-05 DIAGNOSIS — I63131 Cerebral infarction due to embolism of right carotid artery: Secondary | ICD-10-CM

## 2014-05-05 DIAGNOSIS — IMO0002 Reserved for concepts with insufficient information to code with codable children: Secondary | ICD-10-CM

## 2014-05-05 NOTE — Progress Notes (Signed)
Established Carotid Patient  History of Present Illness  Jesse Avila is a 63 y.o. ([birth year]) male who presents with chief complaint: pre-operative counseling.   Previous carotid studies demonstrated: RICA >58% stenosis, LICA <83% stenosis.  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  The patient has never had amaurosis fugax or monocular blindness.  The patient has never had facial drooping or hemiplegia.  He has had L upper and lower extremity weakness and discoordination.  The patient has never had receptive or expressive aphasia.  The patient's previous neurologic deficits have resolved.  Pt was sent to Dr. Domenic Polite for pre-operative risk stratification: low risk.  Past Medical History  Diagnosis Date  . Hyperlipidemia   . Peripheral vascular disease   . Essential hypertension, benign   . 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 GERD (gastroesophageal reflux disease)   . Gout   . ED (erectile dysfunction)   . DJD (degenerative joint disease) of knee   . Carotid artery occlusion   . History of stroke     Posterior/inferior right frontal lobe     Past Surgical History  Procedure Laterality Date  . Knee arthroscopy Right 11/2009  . Meniscus repair  11/2009    History   Social History  . Marital Status: Married    Spouse Name: N/A    Number of Children: N/A  . Years of Education: N/A   Occupational History  . Not on file.   Social History Main Topics  . Smoking status: Former Smoker -- 1.00 packs/day for 30 years    Types: Cigarettes    Quit date: 08/04/2009  . Smokeless tobacco: Never Used  . Alcohol Use: 7.2 oz/week    12 Cans of beer per week  . Drug Use: No  . Sexual Activity: No   Other Topics Concern  . Not on file   Social History Narrative  . No narrative on file    Family History  Problem Relation Age of Onset  . Diabetes Mother   . Hypertension Mother   . Heart disease Mother    Coronary Artery Bypass Graft  . Hyperlipidemia Mother   . Heart attack Mother   . Hypertension Father   . Hyperlipidemia Sister   . Stroke Sister     Current Outpatient Prescriptions on File Prior to Visit  Medication Sig Dispense Refill  . aspirin 81 MG tablet Take 1 tablet (81 mg total) by mouth daily.  30 tablet    . cilostazol (PLETAL) 100 MG tablet Take 100 mg by mouth 2 (two) times daily.      . clopidogrel (PLAVIX) 75 MG tablet Take 1 tablet (75 mg total) by mouth daily.  30 tablet  0  . fenofibrate 160 MG tablet Take 160 mg by mouth daily.      Marland Kitchen glimepiride (AMARYL) 1 MG tablet Take 1 mg by mouth daily with breakfast.       . metFORMIN (GLUCOPHAGE) 500 MG tablet Take 1 tablet (500 mg total) by mouth 3 (three) times daily.      Marland Kitchen olmesartan (BENICAR) 40 MG tablet Take 20 mg by mouth daily.       . rosuvastatin (CRESTOR) 40 MG tablet Take 20 mg by mouth daily. For cholesterol      . zolpidem (AMBIEN) 10 MG tablet Take 1 tablet (10 mg total) by mouth at bedtime as needed for sleep.  30 tablet  0  No current facility-administered medications on file prior to visit.    Allergies  Allergen Reactions  . Cialis [Tadalafil] Other (See Comments)    Headache    REVIEW OF SYSTEMS: (Positives checked otherwise negative)   CARDIOVASCULAR: []  chest pain, []  chest pressure, []  palpitations, []  shortness of breath when laying flat, []  shortness of breath with exertion, []  pain in feet when walking, []  pain in feet when laying flat, []  history of blood clot in veins (DVT), []  history of phlebitis, []  swelling in legs, []  varicose veins   PULMONARY: []  productive cough, []  asthma, []  wheezing   NEUROLOGIC: []  weakness in arms or legs, []  numbness in arms or legs, []  difficulty speaking or slurred speech, []  temporary loss of vision in one eye, []  dizziness   HEMATOLOGIC: []  bleeding problems, []  problems with blood clotting too easily   MUSCULOSKEL: []  joint pain, []  joint swelling    GASTROINTEST: []  vomiting blood, []  blood in stool   GENITOURINARY: []  burning with urination, []  blood in urine   PSYCHIATRIC: []  history of major depression   INTEGUMENTARY: []  rashes, []  ulcers   CONSTITUTIONAL: []  fever, []  chills   Physical Examination  Filed Vitals:   05/05/14 1126 05/05/14 1128  BP: 121/67 114/69  Pulse: 92 93  Height: 6' (1.829 m)   Weight: 215 lb (97.523 kg)   SpO2: 93%    Body mass index is 29.15 kg/(m^2).  General: A&O x 3, WDWN   Eyes: PERRLA, EOMI   Pulmonary: Sym exp, good air movt, CTAB, no rales, rhonchi, & wheezing   Cardiac: RRR, Nl S1, S2, no Murmurs, rubs or gallops   Vascular:  Vessel  Right  Left   Radial  Palpable  Palpable   Brachial  Palpable  Palpable   Carotid  Palpable, without bruit  Palpable, without bruit   Aorta  Not palpable  N/A   Femoral  Palpable  Palpable   Popliteal  Not palpable  Not palpable   PT  Not Palpable  Not Palpable   DP  Palpable  Palpable    Gastrointestinal: soft, NTND, -G/R, - HSM, - masses, - CVAT B   Musculoskeletal: M/S 5/5 throughout , Extremities without ischemic changes   Neurologic: CN 2-12 intact , Pain and light touch intact in extremities , Motor exam as listed above    Medical Decision Making  ASAIAH SCARBER is a 63 y.o. male who presents with: Sx R ICA stenosis >80%, Asx L ICA stenosis <80%   Based on the patient's vascular studies and examination, I have offered the patient: R CEA.  I discussed with the patient the risks, benefits, and alternatives to carotid endarterectomy.    The patient is aware that the risks of carotid endarterectomy include but are not limited to: bleeding, infection, stroke, myocardial infarction, death, cranial nerve injuries both temporary and permanent, neck hematoma, possible airway compromise, labile blood pressure post-operatively, cerebral hyperperfusion syndrome, and possible need for additional interventions in the future.   The patient is  aware of the risks and agrees to proceed forward with the procedure.  This is scheduled for the 6th OCT 2015.  .I discussed in depth with the patient the nature of atherosclerosis, and emphasized the importance of maximal medical management including strict control of blood pressure, blood glucose, and lipid levels, antiplatelet agents, obtaining regular exercise, and cessation of smoking.    The patient is aware that without maximal medical management the underlying atherosclerotic disease process will progress,  limiting the benefit of any interventions. The patient is currently on a statin: Crestor. The patient is currently on an anti-platelet: ASA, Plavix.  Thank you for allowing [place] to participate in this patient's care.  Adele Barthel, MD [facility] Office: [phone] Pager: [phone]  05/05/2014, 1:02 PM

## 2014-05-07 ENCOUNTER — Ambulatory Visit (INDEPENDENT_AMBULATORY_CARE_PROVIDER_SITE_OTHER): Payer: 59 | Admitting: Internal Medicine

## 2014-05-07 VITALS — BP 116/60 | HR 97 | Temp 97.8°F | Resp 16 | Ht 73.0 in | Wt 212.0 lb

## 2014-05-07 DIAGNOSIS — B029 Zoster without complications: Secondary | ICD-10-CM

## 2014-05-07 MED ORDER — VALACYCLOVIR HCL 1 G PO TABS: 1000.0000 mg | ORAL_TABLET | Freq: Three times a day (TID) | ORAL | Status: DC

## 2014-05-07 NOTE — Progress Notes (Signed)
Subjective:    Patient ID: Jesse Avila, male    DOB: June 10, 1951, 63 y.o.   MRN: [id number] This chart was scribed for [place]. Laney Pastor, MD by Steva Colder, ED Scribe. The patient was seen in room 14 at 1:39 PM.   Chief Complaint  Patient presents with  . Rash    r arm    HPI Jesse Avila is a 63 y.o. male who presents today complaining of rash. He states that the rash is on his right arm and back of the shoulder. It feels irritated by tshirt touching but He denies tingling, numbness, and any other associated symptoms. He voices concern for if he is contagious.   He states that he is having surgery Tuesday on a artery in his neck by Dr. Bridgett Larsson.    Patient Active Problem List   Diagnosis Date Noted  . Occlusion and stenosis of carotid artery without mention of cerebral infarction 04/21/2014  . History of stroke 04/21/2014  . Carotid stenosis with cerebral infarction less than 8 weeks ago 04/16/2014  . Diabetes mellitus, type 2 04/14/2014  . Obesity 04/14/2014  . Insomnia 10/18/2013  . Preop cardiovascular exam 12/12/2011  . DJD (degenerative joint disease) of knee   . ED (erectile dysfunction)   . GERD (gastroesophageal reflux disease)   . Essential hypertension, benign   . Peripheral vascular disease   . Hyperlipidemia   . Atherosclerosis of native arteries of the extremities with intermittent claudication 11/19/2011   Past Medical History  Diagnosis Date  . Hyperlipidemia   . Peripheral vascular disease   . Essential hypertension, benign   . 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 GERD (gastroesophageal reflux disease)   . Gout   . ED (erectile dysfunction)   . DJD (degenerative joint disease) of knee   . Carotid artery occlusion   . History of stroke     Posterior/inferior right frontal lobe    Past Surgical History  Procedure Laterality Date  . Knee arthroscopy Right 11/2009  . Meniscus  repair  11/2009   Allergies  Allergen Reactions  . Cialis [Tadalafil] Other (See Comments)    Headache   Prior to Admission medications   Medication Sig Start Date End Date Taking? Authorizing Provider  ACCU-CHEK SMARTVIEW test strip  04/26/14  Yes Historical Provider, MD  acetaminophen (TYLENOL) 500 MG tablet Take 1,000 mg by mouth every 6 (six) hours as needed for mild pain.   Yes Historical Provider, MD  aspirin 81 MG tablet Take 1 tablet (81 mg total) by mouth daily. 04/16/14 07/16/14 Yes [name], DO  cilostazol (PLETAL) 100 MG tablet Take 100 mg by mouth 2 (two) times daily.   Yes Historical Provider, MD  clopidogrel (PLAVIX) 75 MG tablet Take 1 tablet (75 mg total) by mouth daily. 04/16/14  Yes [name], DO  fenofibrate 160 MG tablet Take 160 mg by mouth daily.   Yes Historical Provider, MD  glimepiride (AMARYL) 1 MG tablet Take 1 mg by mouth daily with breakfast.  03/29/14  Yes Historical Provider, MD  metFORMIN (GLUCOPHAGE) 500 MG tablet Take 1 tablet (500 mg total) by mouth 3 (three) times daily. 04/18/14  Yes [name], DO  metFORMIN (GLUCOPHAGE-XR) 500 MG 24 hr tablet  05/01/14  Yes Historical Provider, MD  NAPROXEN DR 500 MG EC tablet  04/26/14  Yes Historical Provider, MD  olmesartan (BENICAR) 40 MG tablet Take 20 mg by mouth  daily.    Yes Historical Provider, MD  rosuvastatin (CRESTOR) 40 MG tablet Take 20 mg by mouth daily. For cholesterol   Yes Historical Provider, MD  zolpidem (AMBIEN) 10 MG tablet Take 1 tablet (10 mg total) by mouth at bedtime as needed for sleep. 10/18/13 05/05/14  Mancel Bale, PA-C      Review of Systems  Skin: Positive for rash (right arm and the back of the right shoulder).  Neurological: Negative for numbness.       Objective:   Physical Exam  Nursing note and vitals reviewed. Constitutional: He is oriented to person, place, and time. He appears well-developed and well-nourished. No distress.  HENT:  Head: Normocephalic and  atraumatic.  Eyes: EOM are normal.  Neck: Neck supple. No tracheal deviation present.  Cardiovascular: Normal rate.   Pulmonary/Chest: Effort normal. No respiratory distress.  Musculoskeletal: Normal range of motion.  Neurological: He is alert and oriented to person, place, and time.  Skin: Skin is warm and dry. Rash noted.  Multiple papulars extending from the posterior shoulder to the elbow and the C6 dermatome, some vesicles and some scabbed lesions.   Psychiatric: He has a normal mood and affect. His behavior is normal.         BP 116/60  Pulse 97  Temp(Src) 97.8 F (36.6 C) (Oral)  Resp 16  Ht 6\' 1"  (1.854 m)  Wt 212 lb (96.163 kg)  BMI 27.98 kg/m2  SpO2 96%   Assessment & Plan:  COORDINATION OF CARE: 1:40 PM-Discussed treatment plan which includes Valtrex with pt at bedside and pt agreed to plan.   I personally performed the services described in this documentation, which was scribed in my presence. The recorded information has been reviewed and is accurate.    Herpes zoster  Meds ordered this encounter  Medications  . valACYclovir (VALTREX) 1000 MG tablet    Sig: Take 1 tablet (1,000 mg total) by mouth 3 (three) times daily.    Dispense:  21 tablet    Refill:  0

## 2014-05-08 ENCOUNTER — Encounter (HOSPITAL_COMMUNITY)
Admission: RE | Admit: 2014-05-08 | Discharge: 2014-05-08 | Disposition: A | Discharge status: Home / Self Care | Payer: 59 | Source: Ambulatory Visit | Attending: Vascular Surgery | Admitting: Vascular Surgery

## 2014-05-08 ENCOUNTER — Encounter (HOSPITAL_COMMUNITY): Payer: Self-pay

## 2014-05-08 ENCOUNTER — Ambulatory Visit (HOSPITAL_COMMUNITY)
Admission: RE | Admit: 2014-05-08 | Discharge: 2014-05-08 | Disposition: A | Discharge status: Home / Self Care | Payer: 59 | Source: Ambulatory Visit | Attending: Anesthesiology | Admitting: Anesthesiology

## 2014-05-08 HISTORY — DX: Cerebral infarction, unspecified: I63.9

## 2014-05-08 LAB — COMPREHENSIVE METABOLIC PANEL
ALT: 29 U/L (ref 0–53)
ANION GAP: 12 (ref 5–15)
AST: 19 U/L (ref 0–37)
Albumin: 4.1 g/dL (ref 3.5–5.2)
Alkaline Phosphatase: 59 U/L (ref 39–117)
BUN: 17 mg/dL (ref 6–23)
CALCIUM: 9.6 mg/dL (ref 8.4–10.5)
CO2: 22 mEq/L (ref 19–32)
Chloride: 106 mEq/L (ref 96–112)
Creatinine, Ser: 1.04 mg/dL (ref 0.50–1.35)
GFR calc non Af Amer: 76 mL/min — ABNORMAL LOW (ref >90)
GFR, EST AFRICAN AMERICAN: 88 mL/min — AB (ref >90)
Glucose, Bld: 106 mg/dL — ABNORMAL HIGH (ref 70–99)
Potassium: 4.1 mEq/L (ref 3.7–5.3)
Sodium: 140 mEq/L (ref 137–147)
TOTAL PROTEIN: 7.2 g/dL (ref 6.0–8.3)
Total Bilirubin: 0.3 mg/dL (ref 0.3–1.2)

## 2014-05-08 LAB — CBC
HEMATOCRIT: 33.6 % — AB (ref 39.0–52.0)
Hemoglobin: 11.8 g/dL — ABNORMAL LOW (ref 13.0–17.0)
MCH: 32.3 pg (ref 26.0–34.0)
MCHC: 35.1 g/dL (ref 30.0–36.0)
MCV: 92.1 fL (ref 78.0–100.0)
Platelets: 146 10*3/uL — ABNORMAL LOW (ref 150–400)
RBC: 3.65 MIL/uL — ABNORMAL LOW (ref 4.22–5.81)
RDW: 12.6 % (ref 11.5–15.5)
WBC: 3.8 10*3/uL — ABNORMAL LOW (ref 4.0–10.5)

## 2014-05-08 LAB — TYPE AND SCREEN
ABO/RH(D): O POS
Antibody Screen: NEGATIVE

## 2014-05-08 LAB — URINALYSIS, ROUTINE W REFLEX MICROSCOPIC
Bilirubin Urine: NEGATIVE
GLUCOSE, UA: NEGATIVE mg/dL
HGB URINE DIPSTICK: NEGATIVE
KETONES UR: NEGATIVE mg/dL
Leukocytes, UA: NEGATIVE
Nitrite: NEGATIVE
Protein, ur: NEGATIVE mg/dL
Specific Gravity, Urine: 1.022 (ref 1.005–1.030)
Urobilinogen, UA: 0.2 mg/dL (ref 0.0–1.0)
pH: 5.5 (ref 5.0–8.0)

## 2014-05-08 LAB — ABO/RH: ABO/RH(D): O POS

## 2014-05-08 LAB — SURGICAL PCR SCREEN
MRSA, PCR: NEGATIVE
STAPHYLOCOCCUS AUREUS: NEGATIVE

## 2014-05-08 LAB — PROTIME-INR
INR: 1.03 (ref 0.00–1.49)
PROTHROMBIN TIME: 13.5 s (ref 11.6–15.2)

## 2014-05-08 LAB — APTT: aPTT: 27 seconds (ref 24–37)

## 2014-05-08 MED ORDER — DEXTROSE 5 % IV SOLN
1.5000 g | INTRAVENOUS | Status: AC
  Administered 2014-05-09: 1.5 g via INTRAVENOUS

## 2014-05-08 NOTE — Progress Notes (Addendum)
Patient stated that his right arm, on Saturday, felt like "raw, burning sensation".  He then went to urgent care and the md there said he had a "mild case of shingles".  Was given some Valtrex, which he started yesterday, Sunday. Have been trying to get a hold of Dr. Bridgett Larsson, and have called their office twice. I have requested Myra Gianotti, PA and Willeen Cass, NP have come and examined the patient.  DA Received call back from [place] @ office.  Dr. Bridgett Larsson wants to continue as planned for surgery.  DA

## 2014-05-08 NOTE — Progress Notes (Signed)
05/08/14 1514  OBSTRUCTIVE SLEEP APNEA  Have you ever been diagnosed with sleep apnea through a sleep study? No  Do you snore loudly (loud enough to be heard through closed doors)?  0  Do you often feel tired, fatigued, or sleepy during the daytime? 0  Has anyone observed you stop breathing during your sleep? 0  Do you have, or are you being treated for high blood pressure? 1  BMI more than 35 kg/m2? 0  Age over 61 years old? 1  Neck circumference greater than 40 cm/16 inches? 1  Gender: 1  Obstructive Sleep Apnea Score 4  Score 4 or greater  Results sent to PCP

## 2014-05-08 NOTE — Pre-Procedure Instructions (Signed)
Jesse Avila  05/08/2014   Your procedure is scheduled on:  Tuesday, Oct. 6th   Report to [facility] Admitting at  5:30 AM.   Call this number if you have problems the morning of surgery: [phone]   Remember:   Do not eat food or drink liquids after midnight tonight.   Take these medicines the morning of surgery with A SIP OF WATER:  None   Do not wear jewelry, make-up or nail polish.  Do not wear lotions, powders, or perfumes. You may NOT wear deodorant.  Do not shave 48 hours prior to surgery.    Do not bring valuables to the hospital.  [facility] is not responsible for any belongings or valuables.               Contacts, dentures or bridgework may not be worn into surgery.  Leave suitcase in the car. After surgery it may be brought to your room.  For patients admitted to the hospital, discharge time is determined by your treatment team.               Name and phone number of your driver:    Special Instructions:  "Preparing for Surgery" instruction sheet.   Please read over the following fact sheets that you were given: Pain Booklet, Blood Transfusion Information, MRSA Information and Surgical Site Infection Prevention

## 2014-05-08 NOTE — Progress Notes (Signed)
Anesthesia PAT Evaluation: Patient is a 63 year old male scheduled for right CEA on 05/09/14 by Dr. Bridgett Larsson.  History includes former smoker, CVA 04/12/14 (posterior/inferior right frontal lobe; he has mild decreased sensation in his LUE but otherwise no significant residual weakness; no dysphagia) '15, HLD, DM2, HTN, ED, DJD, arthritis, gout, PAD s/p bilateral CIA stents 03/29/12. PCP is Dr. Marisue Humble.  He was sent to cardiologist Dr. Domenic Polite for a preoperative evaluation and was ultimately cleared.  Nuclear stress test on 05/03/14 showed:  1. Inferior/inferior septal defect as outlined above, suspect a component of diaphragmatic attenuation, although cannot exclude mild ischemia in the mid to apical inferoseptal wall.  2. Gated imaging suggest global hypokinesis.  3. Left ventricular ejection fraction of 39%. Recent echocardiogram however reported LVEF of 55-60% with normal wall motion.  4. Excluding LVEF findings, overall low-risk stress test findings*. Study would be consistent with underlying coronary atherosclerosis, although without high ischemic burden.  *2012 Appropriate Use Criteria for Coronary Revascularization  Focused Update: J Am Coll Cardiol. 2012;59(9):857-881.  Http://content.airportbarriers.com.aspx?articleid=[email or web address] Report was reviewed by cardiologist Dr. Domenic Polite who stated, "Overall low risk study, consistent with underlying coronary atherosclerosis which was suspected based on risk factors. He should be able to go ahead with CEA at an acceptable perioperative cardiac risk."  Echo on 04/15/14 showed:  - Left ventricle: The cavity size was normal. Wall thickness was normal. Systolic function was normal. The estimated ejection fraction was in the range of 55% to 60%. Wall motion was normal; there were no regional wall motion abnormalities. Doppler parameters are consistent with abnormal left ventricular relaxation (grade 1 diastolic dysfunction). - Left atrium: The atrium was mildly  dilated. Impressions: No cardiac source of emboli was indentified.  EKG on 05/08/14 showed: NSR, anteroseptal infarct (age undetermined).  Carotid dopplers on 04/15/14 showed: Right:moderate mixed plaque origin and proximal ICA and ECA. 40-59% ICA stenosis. Left: moderate to severe calcific plaque CCA and origin and proximal ICA and ECA. 60-79% ICA stenosis. Bilateral vertebral artery flow is antegrade. However, CTA of the neck on 04/16/14 showed: 1 cm segment of severely stenotic internal carotid artery with luminal narrowing of 80% diameter stenosis. Moderately severe stenosis of the cavernous carotid due to heavily calcified atherosclerotic disease. Linear filling defect in the origin of the left common carotid artery may represent atherosclerotic plaque or focal dissection with mild stenosis. 60% diameter stenosis distal left common carotid artery. 50% diameter stenosis left internal carotid artery. Heavily calcified cavernous carotid artery with moderately severe stenosis Mild vertebral atherosclerotic disease.  04/14/14 MRI/MRA report: MRI HEAD IMPRESSION:  1. Acute ischemic infarct within the posterior/inferior right  frontal lobe as above. No significant mass effect or associated hemorrhage.  2. Remote infarcts involving the right basal ganglia and right cerebellar hemisphere.  3. Mild generalized cerebral atrophy.  MRA HEAD IMPRESSION:  1. No proximal branch occlusion or hemodynamically significant stenosis within the intracranial circulation.  2. Multi focal atherosclerotic irregularity within the cavernous ICAs bilaterally, right greater than left.  3. Question 2-3 mm focal outpouching extending from the cavernous left ICA as above. Finding is suspicious for a possible small aneurysm.  Preoperative CXR and labs noted.  UA is still pending.   Asked to evaluate patient during his PAT visit due to urgent care visit yesterday with treatment for herpes zoster.  He has been on Valtrex for 24  hours. Symptoms started on his right shoulder/RUE on 05/06/14. The lesions feel raw, irritated particularly when touching his shirt. Exam showed multiple red  papular lesions on his right shoulder down to his distal right FA, some darker (possible early scabbing) on his shoulder. One lesion on his distal forearm appeared more vesicular in appearance.  No weepy lesions, no drainage.  I discussed findings with Jackalyn Lombard, RN with [facility]. She also reviewed urgent care notes.  Because patient has been evaluated by a physician and treatment for Shingles initiated, she stated that patient will need to be on contact and airborne precautions until all lesions are scabbed over. She also said a delay between OR cases in that same room would be required. Rash does not appear disseminated, but localized.  Case and infection prevention recommendations discussed with Dr. Bridgett Larsson.  Because patient has symptomatic carotid stenosis he feels like case should proceed as planned. Dr. Bridgett Larsson is off tomorrow afternoon so, he does not have any known cases to follow.  (Currently, this case is the only procedure booked in this room.)  I spoke with Suanne Marker at the OR front desk regarding ID recommendations and was told room would be blocked for at least one hour post-procedure.  I also notified Short Stay RN Heath Lark who will be working tomorrow morning in [place]. He will work on [facility] and updating his isolation status in [place].  George Hugh [facility] Short Stay Center/Anesthesiology Phone [phone] 05/08/2014 5:13 PM

## 2014-05-09 ENCOUNTER — Encounter (HOSPITAL_COMMUNITY)
Admission: RE | Disposition: A | Discharge status: Home / Self Care | Payer: Self-pay | Source: Ambulatory Visit | Attending: Vascular Surgery

## 2014-05-09 ENCOUNTER — Encounter (HOSPITAL_COMMUNITY): Payer: 59 | Admitting: Vascular Surgery

## 2014-05-09 ENCOUNTER — Telehealth: Payer: Self-pay | Admitting: Vascular Surgery

## 2014-05-09 ENCOUNTER — Encounter (HOSPITAL_COMMUNITY): Payer: Self-pay | Admitting: Anesthesiology

## 2014-05-09 ENCOUNTER — Inpatient Hospital Stay (HOSPITAL_COMMUNITY): Payer: 59 | Admitting: Vascular Surgery

## 2014-05-09 ENCOUNTER — Inpatient Hospital Stay (HOSPITAL_COMMUNITY)
Admission: RE | Admit: 2014-05-09 | Discharge: 2014-05-10 | DRG: 039 | Disposition: A | Discharge status: Home / Self Care | Payer: 59 | Source: Ambulatory Visit | Attending: Vascular Surgery | Admitting: Vascular Surgery

## 2014-05-09 DIAGNOSIS — Z7982 Long term (current) use of aspirin: Secondary | ICD-10-CM | POA: Diagnosis not present

## 2014-05-09 DIAGNOSIS — Z823 Family history of stroke: Secondary | ICD-10-CM | POA: Diagnosis not present

## 2014-05-09 DIAGNOSIS — I739 Peripheral vascular disease, unspecified: Secondary | ICD-10-CM | POA: Diagnosis present

## 2014-05-09 DIAGNOSIS — R21 Rash and other nonspecific skin eruption: Secondary | ICD-10-CM | POA: Diagnosis present

## 2014-05-09 DIAGNOSIS — M109 Gout, unspecified: Secondary | ICD-10-CM | POA: Diagnosis present

## 2014-05-09 DIAGNOSIS — Z833 Family history of diabetes mellitus: Secondary | ICD-10-CM | POA: Diagnosis not present

## 2014-05-09 DIAGNOSIS — Z8249 Family history of ischemic heart disease and other diseases of the circulatory system: Secondary | ICD-10-CM

## 2014-05-09 DIAGNOSIS — Z7902 Long term (current) use of antithrombotics/antiplatelets: Secondary | ICD-10-CM

## 2014-05-09 DIAGNOSIS — Z8673 Personal history of transient ischemic attack (TIA), and cerebral infarction without residual deficits: Secondary | ICD-10-CM | POA: Diagnosis not present

## 2014-05-09 DIAGNOSIS — K219 Gastro-esophageal reflux disease without esophagitis: Secondary | ICD-10-CM | POA: Diagnosis present

## 2014-05-09 DIAGNOSIS — I6529 Occlusion and stenosis of unspecified carotid artery: Secondary | ICD-10-CM | POA: Diagnosis present

## 2014-05-09 DIAGNOSIS — M179 Osteoarthritis of knee, unspecified: Secondary | ICD-10-CM | POA: Diagnosis present

## 2014-05-09 DIAGNOSIS — I6521 Occlusion and stenosis of right carotid artery: Secondary | ICD-10-CM

## 2014-05-09 DIAGNOSIS — Z79899 Other long term (current) drug therapy: Secondary | ICD-10-CM

## 2014-05-09 DIAGNOSIS — I10 Essential (primary) hypertension: Secondary | ICD-10-CM | POA: Diagnosis present

## 2014-05-09 DIAGNOSIS — E119 Type 2 diabetes mellitus without complications: Secondary | ICD-10-CM | POA: Diagnosis present

## 2014-05-09 DIAGNOSIS — E785 Hyperlipidemia, unspecified: Secondary | ICD-10-CM | POA: Diagnosis present

## 2014-05-09 HISTORY — PX: ENDARTERECTOMY: SHX5162

## 2014-05-09 LAB — HEMOGLOBIN A1C
Hgb A1c MFr Bld: 6.8 % — ABNORMAL HIGH (ref <5.7)
MEAN PLASMA GLUCOSE: 148 mg/dL — AB (ref <117)

## 2014-05-09 LAB — GLUCOSE, CAPILLARY
GLUCOSE-CAPILLARY: 145 mg/dL — AB (ref 70–99)
Glucose-Capillary: 122 mg/dL — ABNORMAL HIGH (ref 70–99)
Glucose-Capillary: 193 mg/dL — ABNORMAL HIGH (ref 70–99)

## 2014-05-09 SURGERY — ENDARTERECTOMY, CAROTID
Anesthesia: General | Site: Neck | Laterality: Right

## 2014-05-09 MED ORDER — GLIMEPIRIDE 1 MG PO TABS
1.0000 mg | ORAL_TABLET | Freq: Every day | ORAL | Status: DC
  Administered 2014-05-10: 1 mg via ORAL
  Filled 2014-05-09 (×2): qty 1

## 2014-05-09 MED ORDER — POTASSIUM CHLORIDE CRYS ER 20 MEQ PO TBCR: 20.0000 meq | EXTENDED_RELEASE_TABLET | Freq: Every day | ORAL | Status: DC | PRN

## 2014-05-09 MED ORDER — LABETALOL HCL 5 MG/ML IV SOLN: 10.0000 mg | INTRAVENOUS | Status: DC | PRN

## 2014-05-09 MED ORDER — MIDAZOLAM HCL 5 MG/5ML IJ SOLN
INTRAMUSCULAR | Status: DC | PRN
  Administered 2014-05-09 (×2): 1 mg via INTRAVENOUS

## 2014-05-09 MED ORDER — FENTANYL CITRATE 0.05 MG/ML IJ SOLN
INTRAMUSCULAR | Status: AC
  Filled 2014-05-09: qty 2

## 2014-05-09 MED ORDER — SENNOSIDES-DOCUSATE SODIUM 8.6-50 MG PO TABS
1.0000 | ORAL_TABLET | Freq: Every evening | ORAL | Status: DC | PRN
  Filled 2014-05-09: qty 1

## 2014-05-09 MED ORDER — ONDANSETRON HCL 4 MG/2ML IJ SOLN
INTRAMUSCULAR | Status: AC
  Filled 2014-05-09: qty 2

## 2014-05-09 MED ORDER — ACETAMINOPHEN 10 MG/ML IV SOLN
INTRAVENOUS | Status: DC | PRN
  Administered 2014-05-09: 1000 mg via INTRAVENOUS

## 2014-05-09 MED ORDER — ROCURONIUM BROMIDE 100 MG/10ML IV SOLN
INTRAVENOUS | Status: DC | PRN
  Administered 2014-05-09: 10 mg via INTRAVENOUS
  Administered 2014-05-09: 50 mg via INTRAVENOUS

## 2014-05-09 MED ORDER — ROCURONIUM BROMIDE 50 MG/5ML IV SOLN
INTRAVENOUS | Status: AC
  Filled 2014-05-09: qty 1

## 2014-05-09 MED ORDER — ASPIRIN 81 MG PO CHEW
81.0000 mg | CHEWABLE_TABLET | Freq: Every day | ORAL | Status: DC
  Administered 2014-05-09 – 2014-05-10 (×2): 81 mg via ORAL
  Filled 2014-05-09 (×2): qty 1

## 2014-05-09 MED ORDER — DEXTROSE 5 % IV SOLN
INTRAVENOUS | Status: AC
  Filled 2014-05-09: qty 1.5

## 2014-05-09 MED ORDER — FENTANYL CITRATE 0.05 MG/ML IJ SOLN
INTRAMUSCULAR | Status: AC
  Filled 2014-05-09: qty 5

## 2014-05-09 MED ORDER — METOPROLOL TARTRATE 1 MG/ML IV SOLN: 2.0000 mg | INTRAVENOUS | Status: DC | PRN

## 2014-05-09 MED ORDER — SODIUM CHLORIDE 0.9 % IR SOLN
Status: DC | PRN
  Administered 2014-05-09: 08:00:00

## 2014-05-09 MED ORDER — PROPOFOL 10 MG/ML IV BOLUS
INTRAVENOUS | Status: AC
  Filled 2014-05-09: qty 20

## 2014-05-09 MED ORDER — PANTOPRAZOLE SODIUM 40 MG PO TBEC
40.0000 mg | DELAYED_RELEASE_TABLET | Freq: Every day | ORAL | Status: DC
  Administered 2014-05-09 – 2014-05-10 (×2): 40 mg via ORAL
  Filled 2014-05-09 (×2): qty 1

## 2014-05-09 MED ORDER — FENOFIBRATE 160 MG PO TABS
160.0000 mg | ORAL_TABLET | Freq: Every day | ORAL | Status: DC
  Administered 2014-05-10: 160 mg via ORAL
  Filled 2014-05-09: qty 1

## 2014-05-09 MED ORDER — DOPAMINE-DEXTROSE 3.2-5 MG/ML-% IV SOLN: 3.0000 ug/kg/min | INTRAVENOUS | Status: DC

## 2014-05-09 MED ORDER — DOCUSATE SODIUM 100 MG PO CAPS
100.0000 mg | ORAL_CAPSULE | Freq: Every day | ORAL | Status: DC
  Administered 2014-05-10: 100 mg via ORAL
  Filled 2014-05-09: qty 1

## 2014-05-09 MED ORDER — LIDOCAINE HCL (CARDIAC) 20 MG/ML IV SOLN
INTRAVENOUS | Status: DC | PRN
  Administered 2014-05-09: 100 mg via INTRAVENOUS

## 2014-05-09 MED ORDER — OXYCODONE HCL 5 MG PO TABS: 5.0000 mg | ORAL_TABLET | Freq: Four times a day (QID) | ORAL | Status: DC | PRN

## 2014-05-09 MED ORDER — HEPARIN SODIUM (PORCINE) 1000 UNIT/ML IJ SOLN
INTRAMUSCULAR | Status: AC
  Filled 2014-05-09: qty 1

## 2014-05-09 MED ORDER — ACETAMINOPHEN 10 MG/ML IV SOLN
INTRAVENOUS | Status: AC
  Filled 2014-05-09: qty 100

## 2014-05-09 MED ORDER — HEPARIN SODIUM (PORCINE) 1000 UNIT/ML IJ SOLN
INTRAMUSCULAR | Status: DC | PRN
  Administered 2014-05-09: 2000 [IU] via INTRAVENOUS
  Administered 2014-05-09: 8000 [IU] via INTRAVENOUS

## 2014-05-09 MED ORDER — PHENYLEPHRINE HCL 10 MG/ML IJ SOLN
10.0000 mg | INTRAVENOUS | Status: DC | PRN
  Administered 2014-05-09: 50 ug/min via INTRAVENOUS

## 2014-05-09 MED ORDER — OXYCODONE-ACETAMINOPHEN 5-325 MG PO TABS
1.0000 | ORAL_TABLET | ORAL | Status: DC | PRN
  Administered 2014-05-09: 1 via ORAL
  Administered 2014-05-09: 2 via ORAL
  Administered 2014-05-10: 1 via ORAL
  Filled 2014-05-09 (×4): qty 1

## 2014-05-09 MED ORDER — PROMETHAZINE HCL 25 MG/ML IJ SOLN: 6.2500 mg | INTRAMUSCULAR | Status: DC | PRN

## 2014-05-09 MED ORDER — THROMBIN 20000 UNITS EX SOLR
CUTANEOUS | Status: DC | PRN
  Administered 2014-05-09: 09:00:00 via TOPICAL

## 2014-05-09 MED ORDER — LIDOCAINE HCL 4 % MT SOLN
OROMUCOSAL | Status: DC | PRN
  Administered 2014-05-09: 4 mL via TOPICAL

## 2014-05-09 MED ORDER — PHENYLEPHRINE HCL 10 MG/ML IJ SOLN
INTRAMUSCULAR | Status: DC | PRN
  Administered 2014-05-09: 40 ug via INTRAVENOUS
  Administered 2014-05-09: 80 ug via INTRAVENOUS
  Administered 2014-05-09: 40 ug via INTRAVENOUS

## 2014-05-09 MED ORDER — SODIUM CHLORIDE 0.9 % IV SOLN
INTRAVENOUS | Status: DC
  Administered 2014-05-09 (×2): via INTRAVENOUS

## 2014-05-09 MED ORDER — EPHEDRINE SULFATE 50 MG/ML IJ SOLN
INTRAMUSCULAR | Status: DC | PRN
  Administered 2014-05-09: 10 mg via INTRAVENOUS

## 2014-05-09 MED ORDER — NEOSTIGMINE METHYLSULFATE 10 MG/10ML IV SOLN
INTRAVENOUS | Status: DC | PRN
  Administered 2014-05-09: 3 mg via INTRAVENOUS

## 2014-05-09 MED ORDER — GLYCOPYRROLATE 0.2 MG/ML IJ SOLN
INTRAMUSCULAR | Status: AC
  Filled 2014-05-09: qty 3

## 2014-05-09 MED ORDER — INSULIN ASPART 100 UNIT/ML ~~LOC~~ SOLN: 0.0000 [IU] | Freq: Three times a day (TID) | SUBCUTANEOUS | Status: DC

## 2014-05-09 MED ORDER — CHLORHEXIDINE GLUCONATE CLOTH 2 % EX PADS: 6.0000 | MEDICATED_PAD | Freq: Once | CUTANEOUS | Status: DC

## 2014-05-09 MED ORDER — 0.9 % SODIUM CHLORIDE (POUR BTL) OPTIME
TOPICAL | Status: DC | PRN
  Administered 2014-05-09: 1000 mL

## 2014-05-09 MED ORDER — ACETAMINOPHEN 500 MG PO TABS
1000.0000 mg | ORAL_TABLET | Freq: Four times a day (QID) | ORAL | Status: DC | PRN
  Filled 2014-05-09: qty 2

## 2014-05-09 MED ORDER — PHENOL 1.4 % MT LIQD: 1.0000 | OROMUCOSAL | Status: DC | PRN

## 2014-05-09 MED ORDER — ROSUVASTATIN CALCIUM 20 MG PO TABS
20.0000 mg | ORAL_TABLET | Freq: Every day | ORAL | Status: DC
  Administered 2014-05-10: 20 mg via ORAL
  Filled 2014-05-09: qty 1

## 2014-05-09 MED ORDER — HYDRALAZINE HCL 20 MG/ML IJ SOLN: 10.0000 mg | INTRAMUSCULAR | Status: DC | PRN

## 2014-05-09 MED ORDER — EPHEDRINE SULFATE 50 MG/ML IJ SOLN
INTRAMUSCULAR | Status: AC
  Filled 2014-05-09: qty 1

## 2014-05-09 MED ORDER — ENOXAPARIN SODIUM 30 MG/0.3ML ~~LOC~~ SOLN
30.0000 mg | SUBCUTANEOUS | Status: DC
  Administered 2014-05-10: 30 mg via SUBCUTANEOUS
  Filled 2014-05-09 (×2): qty 0.3

## 2014-05-09 MED ORDER — ONDANSETRON HCL 4 MG/2ML IJ SOLN: 4.0000 mg | Freq: Four times a day (QID) | INTRAMUSCULAR | Status: DC | PRN

## 2014-05-09 MED ORDER — VALACYCLOVIR HCL 500 MG PO TABS
1000.0000 mg | ORAL_TABLET | Freq: Three times a day (TID) | ORAL | Status: DC
  Filled 2014-05-09 (×2): qty 2

## 2014-05-09 MED ORDER — LIDOCAINE HCL (CARDIAC) 20 MG/ML IV SOLN
INTRAVENOUS | Status: AC
  Filled 2014-05-09: qty 5

## 2014-05-09 MED ORDER — FENTANYL CITRATE 0.05 MG/ML IJ SOLN
25.0000 ug | INTRAMUSCULAR | Status: DC | PRN
  Administered 2014-05-09: 25 ug via INTRAVENOUS

## 2014-05-09 MED ORDER — PROTAMINE SULFATE 10 MG/ML IV SOLN
INTRAVENOUS | Status: DC | PRN
  Administered 2014-05-09 (×3): 10 mg via INTRAVENOUS

## 2014-05-09 MED ORDER — PROTAMINE SULFATE 10 MG/ML IV SOLN
INTRAVENOUS | Status: AC
  Filled 2014-05-09: qty 5

## 2014-05-09 MED ORDER — METFORMIN HCL 500 MG PO TABS
500.0000 mg | ORAL_TABLET | Freq: Three times a day (TID) | ORAL | Status: DC
  Administered 2014-05-09 – 2014-05-10 (×2): 500 mg via ORAL
  Filled 2014-05-09 (×5): qty 1

## 2014-05-09 MED ORDER — STERILE WATER FOR INJECTION IJ SOLN
INTRAMUSCULAR | Status: AC
  Filled 2014-05-09: qty 10

## 2014-05-09 MED ORDER — PROPOFOL 10 MG/ML IV BOLUS
INTRAVENOUS | Status: DC | PRN
  Administered 2014-05-09: 150 mg via INTRAVENOUS

## 2014-05-09 MED ORDER — CILOSTAZOL 100 MG PO TABS
100.0000 mg | ORAL_TABLET | Freq: Two times a day (BID) | ORAL | Status: DC
  Administered 2014-05-09 – 2014-05-10 (×2): 100 mg via ORAL
  Filled 2014-05-09 (×3): qty 1

## 2014-05-09 MED ORDER — GUAIFENESIN-DM 100-10 MG/5ML PO SYRP: 15.0000 mL | ORAL_SOLUTION | ORAL | Status: DC | PRN

## 2014-05-09 MED ORDER — DEXAMETHASONE SODIUM PHOSPHATE 4 MG/ML IJ SOLN
INTRAMUSCULAR | Status: DC | PRN
  Administered 2014-05-09: 4 mg via INTRAVENOUS

## 2014-05-09 MED ORDER — MEPERIDINE HCL 25 MG/ML IJ SOLN: 6.2500 mg | INTRAMUSCULAR | Status: DC | PRN

## 2014-05-09 MED ORDER — DEXTROSE 5 % IV SOLN
1.5000 g | Freq: Two times a day (BID) | INTRAVENOUS | Status: AC
  Administered 2014-05-09 – 2014-05-10 (×2): 1.5 g via INTRAVENOUS
  Filled 2014-05-09 (×2): qty 1.5

## 2014-05-09 MED ORDER — THROMBIN 20000 UNITS EX SOLR
CUTANEOUS | Status: AC
  Filled 2014-05-09: qty 20000

## 2014-05-09 MED ORDER — CLOPIDOGREL BISULFATE 75 MG PO TABS
75.0000 mg | ORAL_TABLET | Freq: Every day | ORAL | Status: DC
  Administered 2014-05-10: 75 mg via ORAL
  Filled 2014-05-09: qty 1

## 2014-05-09 MED ORDER — SODIUM CHLORIDE 0.9 % IV SOLN: 500.0000 mL | Freq: Once | INTRAVENOUS | Status: AC | PRN

## 2014-05-09 MED ORDER — DEXAMETHASONE SODIUM PHOSPHATE 4 MG/ML IJ SOLN
INTRAMUSCULAR | Status: AC
  Filled 2014-05-09: qty 1

## 2014-05-09 MED ORDER — GLYCOPYRROLATE 0.2 MG/ML IJ SOLN
INTRAMUSCULAR | Status: DC | PRN
  Administered 2014-05-09: .4 mg via INTRAVENOUS
  Administered 2014-05-09: 0.1 mg via INTRAVENOUS

## 2014-05-09 MED ORDER — ZOLPIDEM TARTRATE 5 MG PO TABS: 10.0000 mg | ORAL_TABLET | Freq: Every evening | ORAL | Status: DC | PRN

## 2014-05-09 MED ORDER — ALUM & MAG HYDROXIDE-SIMETH 200-200-20 MG/5ML PO SUSP: 15.0000 mL | ORAL | Status: DC | PRN

## 2014-05-09 MED ORDER — IRBESARTAN 300 MG PO TABS
300.0000 mg | ORAL_TABLET | Freq: Every day | ORAL | Status: DC
  Administered 2014-05-10: 300 mg via ORAL
  Filled 2014-05-09: qty 1

## 2014-05-09 MED ORDER — NEOSTIGMINE METHYLSULFATE 10 MG/10ML IV SOLN
INTRAVENOUS | Status: AC
  Filled 2014-05-09: qty 1

## 2014-05-09 MED ORDER — MIDAZOLAM HCL 2 MG/2ML IJ SOLN
INTRAMUSCULAR | Status: AC
  Filled 2014-05-09: qty 2

## 2014-05-09 MED ORDER — BISACODYL 10 MG RE SUPP: 10.0000 mg | Freq: Every day | RECTAL | Status: DC | PRN

## 2014-05-09 MED ORDER — MAGNESIUM SULFATE 40 MG/ML IJ SOLN: 2.0000 g | Freq: Every day | INTRAMUSCULAR | Status: DC | PRN

## 2014-05-09 MED ORDER — METFORMIN HCL ER 500 MG PO TB24: 500.0000 mg | ORAL_TABLET | Freq: Every day | ORAL | Status: DC

## 2014-05-09 MED ORDER — SODIUM CHLORIDE 0.9 % IJ SOLN
INTRAMUSCULAR | Status: AC
  Filled 2014-05-09: qty 10

## 2014-05-09 MED ORDER — SODIUM CHLORIDE 0.9 % IV SOLN: INTRAVENOUS | Status: DC

## 2014-05-09 MED ORDER — ONDANSETRON HCL 4 MG/2ML IJ SOLN
INTRAMUSCULAR | Status: DC | PRN
Start: 2014-05-09 — End: 2014-05-09
  Administered 2014-05-09: 4 mg via INTRAVENOUS

## 2014-05-09 MED ORDER — VALACYCLOVIR HCL 500 MG PO TABS
1000.0000 mg | ORAL_TABLET | Freq: Three times a day (TID) | ORAL | Status: DC
  Administered 2014-05-09 – 2014-05-10 (×3): 1000 mg via ORAL
  Filled 2014-05-09 (×5): qty 2

## 2014-05-09 MED ORDER — PHENYLEPHRINE 40 MCG/ML (10ML) SYRINGE FOR IV PUSH (FOR BLOOD PRESSURE SUPPORT)
PREFILLED_SYRINGE | INTRAVENOUS | Status: AC
  Filled 2014-05-09: qty 10

## 2014-05-09 MED ORDER — ALBUMIN HUMAN 5 % IV SOLN
INTRAVENOUS | Status: DC | PRN
  Administered 2014-05-09: 10:00:00 via INTRAVENOUS

## 2014-05-09 MED ORDER — FENTANYL CITRATE 0.05 MG/ML IJ SOLN
INTRAMUSCULAR | Status: DC | PRN
  Administered 2014-05-09 (×6): 50 ug via INTRAVENOUS

## 2014-05-09 MED ORDER — LACTATED RINGERS IV SOLN
INTRAVENOUS | Status: DC | PRN
  Administered 2014-05-09 (×3): via INTRAVENOUS

## 2014-05-09 MED ORDER — MORPHINE SULFATE 2 MG/ML IJ SOLN: 2.0000 mg | INTRAMUSCULAR | Status: DC | PRN

## 2014-05-09 SURGICAL SUPPLY — 58 items
ADH SKN CLS APL DERMABOND .7 (GAUZE/BANDAGES/DRESSINGS) ×1
ADPR TBG 2 MALE LL ART (MISCELLANEOUS)
BAG DECANTER FOR FLEXI CONT (MISCELLANEOUS) ×3 IMPLANT
BLADE SURG 10 STRL SS (BLADE) ×6 IMPLANT
CANISTER SUCTION 2500CC (MISCELLANEOUS) ×3 IMPLANT
CATH ROBINSON RED A/P 18FR (CATHETERS) ×3 IMPLANT
CATH SUCT 10FR WHISTLE TIP (CATHETERS) ×1 IMPLANT
CLIP TI MEDIUM 6 (CLIP) ×3 IMPLANT
CLIP TI WIDE RED SMALL 6 (CLIP) ×5 IMPLANT
COVER PROBE W GEL 5X96 (DRAPES) ×2 IMPLANT
CRADLE DONUT ADULT HEAD (MISCELLANEOUS) ×3 IMPLANT
DERMABOND ADVANCED (GAUZE/BANDAGES/DRESSINGS) ×2
DERMABOND ADVANCED .7 DNX12 (GAUZE/BANDAGES/DRESSINGS) ×1 IMPLANT
ELECT REM PT RETURN 9FT ADLT (ELECTROSURGICAL) ×3
ELECTRODE REM PT RTRN 9FT ADLT (ELECTROSURGICAL) ×1 IMPLANT
GAUZE SPONGE 2X2 8PLY STRL LF (GAUZE/BANDAGES/DRESSINGS) IMPLANT
GAUZE SPONGE 4X4 12PLY STRL (GAUZE/BANDAGES/DRESSINGS) ×4 IMPLANT
GLOVE BIO SURGEON STRL SZ7 (GLOVE) ×3 IMPLANT
GLOVE BIOGEL PI IND STRL 7.0 (GLOVE) IMPLANT
GLOVE BIOGEL PI IND STRL 7.5 (GLOVE) ×1 IMPLANT
GLOVE BIOGEL PI INDICATOR 7.0 (GLOVE) ×2
GLOVE BIOGEL PI INDICATOR 7.5 (GLOVE) ×4
GLOVE ECLIPSE 6.5 STRL STRAW (GLOVE) ×2 IMPLANT
GLOVE SS BIOGEL STRL SZ 7 (GLOVE) IMPLANT
GLOVE SUPERSENSE BIOGEL SZ 7 (GLOVE) ×4
GLOVE SURG SS PI 6.5 STRL IVOR (GLOVE) ×2 IMPLANT
GLOVE SURG SS PI 7.0 STRL IVOR (GLOVE) ×4 IMPLANT
GOWN STRL REUS W/ TWL LRG LVL3 (GOWN DISPOSABLE) ×3 IMPLANT
GOWN STRL REUS W/TWL LRG LVL3 (GOWN DISPOSABLE) ×15
IV ADAPTER SYR DOUBLE MALE LL (MISCELLANEOUS) IMPLANT
KIT BASIN OR (CUSTOM PROCEDURE TRAY) ×3 IMPLANT
KIT ROOM TURNOVER OR (KITS) ×3 IMPLANT
NS IRRIG 1000ML POUR BTL (IV SOLUTION) ×9 IMPLANT
PACK CAROTID (CUSTOM PROCEDURE TRAY) ×3 IMPLANT
PAD ARMBOARD 7.5X6 YLW CONV (MISCELLANEOUS) ×6 IMPLANT
PATCH VASCULAR VASCU GUARD 1X6 (Vascular Products) ×5 IMPLANT
PROBE PENCIL 8 MHZ STRL DISP (MISCELLANEOUS) ×2 IMPLANT
SET COLLECT BLD 21X3/4 12 PB (MISCELLANEOUS) IMPLANT
SHUNT CAROTID BYPASS 10 (VASCULAR PRODUCTS) ×2 IMPLANT
SHUNT CAROTID BYPASS 12FRX15.5 (VASCULAR PRODUCTS) IMPLANT
SPONGE GAUZE 2X2 STER 10/PKG (GAUZE/BANDAGES/DRESSINGS) ×2
SPONGE INTESTINAL PEANUT (DISPOSABLE) ×6 IMPLANT
SPONGE SURGIFOAM ABS GEL 100 (HEMOSTASIS) ×2 IMPLANT
STOPCOCK 4 WAY LG BORE MALE ST (IV SETS) IMPLANT
SUT ETHILON 3 0 PS 1 (SUTURE) ×2 IMPLANT
SUT MNCRL AB 4-0 PS2 18 (SUTURE) ×3 IMPLANT
SUT PROLENE 6 0 BV (SUTURE) ×13 IMPLANT
SUT PROLENE 7 0 BV 1 (SUTURE) IMPLANT
SUT SILK 3 0 TIES 17X18 (SUTURE)
SUT SILK 3-0 18XBRD TIE BLK (SUTURE) IMPLANT
SUT VIC AB 3-0 SH 27 (SUTURE) ×3
SUT VIC AB 3-0 SH 27X BRD (SUTURE) ×1 IMPLANT
SYR TB 1ML LUER SLIP (SYRINGE) IMPLANT
SYSTEM CHEST DRAIN TLS 7FR (DRAIN) ×2 IMPLANT
TAPE CLOTH SURG 4X10 WHT LF (GAUZE/BANDAGES/DRESSINGS) ×2 IMPLANT
TUBING ART PRESS 48 MALE/FEM (TUBING) IMPLANT
TUBING EXTENTION W/L.L. (IV SETS) IMPLANT
WATER STERILE IRR 1000ML POUR (IV SOLUTION) ×3 IMPLANT

## 2014-05-09 NOTE — Telephone Encounter (Addendum)
Message copied by Doristine Section on Tue May 09, 2014  3:45 PM ------      Message from: Denman George      Created: Tue May 09, 2014  3:11 PM      Regarding: Zigmund Daniel log; also needs 2 wk. f/u with [facility]                   ----- Message -----         From: Gabriel Earing, PA-C         Sent: 05/09/2014   2:56 PM           To: [facility] Charge Pool            S/p right CEA 05/09/14.  F/u with Dr. Bridgett Larsson in 2 weeks.            Thanks,      Aldona Bar ------  notified patient of post op appt. with dr. Bridgett Larsson on 05-26-14 at 8:45

## 2014-05-09 NOTE — Op Note (Signed)
OPERATIVE NOTE  PROCEDURE:   1.  right carotid endarterectomy with bovine patch angioplasty 2.  right intraoperative carotid ultrasound  PRE-OPERATIVE DIAGNOSIS: right symptomatic carotid stenosis >80%  POST-OPERATIVE DIAGNOSIS: same as above   SURGEON: Adele Barthel, MD  ASSISTANT(S): Gerri Lins, PAC   ANESTHESIA: general  ESTIMATED BLOOD LOSS: 300 cc  FINDING(S): 1.  Continuous Doppler audible flow signatures are appropriate for each carotid artery. 2.  No evidence of intimal flap in internal carotid artery visualized on transverse or longitudinal ultrasonography. 3.  Residual atherosclerosis in proximal common carotid artery <30% 4.  Calcific carotid plaque with short length (<1 cm) dissection flap extending distal to the plaque 5.  Essentially tandem lesion in common carotid artery   SPECIMEN(S):  Carotid plaque (sent to Pathology)  INDICATIONS:   Jesse Avila is a 63 y.o. male who presents with right symptomatic carotid stenosis >80%.  I discussed with the patient the risks, benefits, and alternatives to carotid endarterectomy.  I discussed the procedural details of carotid endarterectomy with the patient.  The patient is aware that the risks of carotid endarterectomy include but are not limited to: bleeding, infection, stroke, myocardial infarction, death, cranial nerve injuries both temporary and permanent, neck hematoma, possible airway compromise, labile blood pressure post-operatively, cerebral hyperperfusion syndrome, and possible need for additional interventions in the future. The patient is aware of the risks and agrees to proceed forward with the procedure.  DESCRIPTION: After full informed written consent was obtained from the patient, the patient was brought back to the operating room and placed supine upon the operating table.  Prior to induction, the patient received IV antibiotics.  After obtaining adequate anesthesia, the patient was placed into semi-Fowler  position with a shoulder roll in place and the patient's neck slightly hyperextended and rotated away from the surgical site.  The patient was prepped in the standard fashion for a right carotid endarterectomy.  I made an incision anterior to the sternocleidomastoid muscle and dissected down through the subcutaneous tissue.  The platysmas was opened with electrocautery.  Then I dissected down to the internal jugular vein.  This was dissected posteriorly until I obtained visualization of the common carotid artery.  This was dissected out and then an umbilical tape was placed around the common carotid artery and I loosely applied a Rumel tourniquet.  I then dissected in a periadventitial fashion along the common carotid artery up to the bifurcation.  I then identified the external carotid artery and the superior thyroid artery.  A 2-0 silk tie was looped around the superior thyroid artery, and I also dissected out the external carotid artery and placed a vessel loop around it.  In continuing the dissection to the internal carotid artery, I identified the facial vein.  This was ligated and then transected, giving me improved exposure of the internal carotid artery.  In the process of this dissection, the hypoglossal nerve was identified.  I then dissected out the internal carotid artery until I identified an area of soft tissue in the internal carotid artery.  I dissected slightly distal to this area, and placed an umbilical tape around the artery and loosely applied a Rumel tourniquet.  At this point, we gave the patient a therapeutic bolus of Heparin intravenously (roughly 80 units/kg).  After waiting 3 minutes, then I clamped the internal carotid artery, external carotid artery and then the common carotid artery.  I then made an arteriotomy in the common carotid artery with a 11 blade,  and extended the arteriotomy with a Potts scissor down into the common carotid artery, then I carried the arteriotomy through the  bifurcation into the internal carotid artery until I reached an area that was not diseased.  At this point, I took the 10 shunt that previously been prepared and I inserted it into the internal carotid artery.  The Rumel tourniquet was then applied to this end of the shunt.  I unclamped the shunt to verify retrograde blood flow in the internal carotid artery.  I then placed the other end of the shunt into the common carotid artery after unclamping the artery.  The Rumel was tightened down around the shunt.  At this point, I verified blood flow in the shunt with a continuous doppler.  At this point, I started the endarterectomy in the common carotid artery with a Technical brewer and carried this dissection down into the common carotid artery circumferentially.  There was significant common carotid artery plaque so I had to extend the arteriotomy another 2-3 cm proximally.  I then transected the plaque at a segment where it was adherent.  I then carried this dissection up into the external carotid artery.  The plaque was extracted by unclamping the external carotid artery and everting the artery.  The dissection was then carried into the internal carotid artery, extracting the remaining portion of the carotid plaque.  I passed the plaque off the field as a specimen.  I then spent the next 30 minutes removing intimal flaps and loose debris.  Eventually I reached the point where the residual plaque was densely adherent and any further dissection would compromise the integrity of the wall.  After verifying that there was no more loose intimal flaps or debris, I re-interrogated the entirety of this carotid artery.  At this point, I was satisfied that the minimal remaining disease was densely adherent to the wall and wall integrity was intact.  At this point, I then fashioned a bovine pericardial patch for the geometry of this artery and sewed it in place with a running stitch of 6-0 Prolene.  The patch was too short  for arteriotomy in this case, so I got a second bovine pericardial patch and soaked it for the manufacturer recommend time period.  I fashioned the bovine pericardial patch for the geometry of the distal artery.  I sewed this patch into place with a running 6-0 Prolene, tying to the other patch suture.  I then sewed the patches together with a running 6-0 Prolene.  Prior to completing this patch angioplasty, I removed the shunt first from the internal carotid artery, from which there was excellent backbleeding, and clamped it.  Then I removed the shunt from the common carotid artery, from which there was excellent antegrade bleeding, and then clamped it.  At this point, I allowed the external carotid artery to backbleed, which was excellent.  Then I instilled heparinized saline in this patched artery and then completed the patch angioplasty in the usual fashion.  First, I released the clamp on the external carotid artery, then I released it on the common carotid artery.  After waiting a few seconds, I then released it on the internal carotid artery.  I then interrogated this patient's arteries with the continuous Doppler.  The audible waveforms in each artery were consistent with the expected characteristics for each artery.  The Sonosite probe was then sterilely draped and used to interrogate the carotid artery in both longitudinal and transverse views.  At this  point, I washed out the wound, and placed thrombin and Gelfoam throughout.  I also gave the patient 30 mg of protamine to reverse his anticoagulation.   After waiting a few minutes, I removed the thrombin and Gelfoam and washed out the wound.  There was no more active bleeding in the surgical site.  As this patient is on Plavix and aspirin, I elected to place a TLS drain.  The trocar was routed inferiorly underneath the skin and the drain placed adjacent to the artery.  I transected the trocar and connected the drain to the suction interface.  I then  reapproximated the platysma muscle with a running stitch of 3-0 Vicryl.  The skin was then reapproximated with a running subcuticular 4-0 Monocryl stitch.  The skin was then cleaned, dried and Dermabond was used to reinforce the skin closure.  The test tube was connected to the suction interface to start drainage within the neck incision.  The patient woke without any problems, neurologically intact.     COMPLICATIONS: none  CONDITION: stable  Adele Barthel, MD [facility] Office: [phone] Pager: [phone]  05/09/2014, 11:14 AM

## 2014-05-09 NOTE — Anesthesia Procedure Notes (Signed)
Procedure Name: Intubation Date/Time: 05/09/2014 7:50 AM Performed by: Susa Loffler Pre-anesthesia Checklist: Patient identified, Timeout performed, Emergency Drugs available, Suction available and Patient being monitored Patient Re-evaluated:Patient Re-evaluated prior to inductionOxygen Delivery Method: Circle system utilized Preoxygenation: Pre-oxygenation with 100% oxygen Intubation Type: IV induction Ventilation: Mask ventilation without difficulty and Oral airway inserted - appropriate to patient size Laryngoscope Size: Mac and 4 Grade View: Grade I Tube type: Oral Tube size: 7.5 mm Number of attempts: 1 Airway Equipment and Method: Stylet,  Oral airway and LTA kit utilized Placement Confirmation: ETT inserted through vocal cords under direct vision,  positive ETCO2 and breath sounds checked- equal and bilateral Secured at: 23 cm Tube secured with: Tape Dental Injury: Teeth and Oropharynx as per pre-operative assessment  Comments: Intubation performed by Verneita Griffes and assisted by Dr. Tresa Moore

## 2014-05-09 NOTE — Plan of Care (Signed)
Problem: Consults Goal: Diagnosis CEA/CES/AAA Stent Carotid Endarterectomy (CEA)

## 2014-05-09 NOTE — Anesthesia Postprocedure Evaluation (Signed)
  Anesthesia Post-op Note  Patient: Jesse Avila  Procedure(s) Performed: Procedure(s): RIGHT CAROTID ENDARTERECTOMY WITH PATCH ANGIOPLASTY (Right)  Patient Location: PACU  Anesthesia Type:General  Level of Consciousness: awake and alert   Airway and Oxygen Therapy: Patient Spontanous Breathing  Post-op Pain: mild  Post-op Assessment: Post-op Vital signs reviewed and Patient's Cardiovascular Status Stable  Post-op Vital Signs: Reviewed and stable  Last Vitals:  Filed Vitals:   05/09/14 1315  BP:   Pulse: 97  Temp: 36.9 C  Resp: 21    Complications: No apparent anesthesia complications

## 2014-05-09 NOTE — Anesthesia Preprocedure Evaluation (Addendum)
Anesthesia Evaluation  Patient identified by MRN, date of birth, ID band Patient awake    Reviewed: Allergy & Precautions, H&P , NPO status , Patient's Chart, lab work & pertinent test results  Airway Mallampati: II TM Distance: >3 FB Neck ROM: Full    Dental  (+) Partial Upper, Partial Lower   Pulmonary former smoker (quit 2011 30 packyear hx),  breath sounds clear to auscultation        Cardiovascular hypertension, Pt. 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   Neuro/Psych CVA R, mild LU ext weakness residual, Bilateral carotid stenosis R>L, R 60-80%, L 50% CVA, Residual Symptoms    GI/Hepatic GERD-  Medicated,  Endo/Other  diabetes, Type 2  Renal/GU      Musculoskeletal   Abdominal (+) + obese,   Peds  Hematology   Anesthesia Other Findings   Reproductive/Obstetrics                         Anesthesia Physical Anesthesia Plan  ASA: IV  Anesthesia Plan: General   Post-op Pain Management:    Induction: Intravenous  Airway Management Planned: Oral ETT  Additional Equipment:   Intra-op Plan:   Post-operative Plan: Extubation in OR  Informed Consent: I have reviewed the patients History and Physical, chart, labs and discussed the procedure including the risks, benefits and alternatives for the proposed anesthesia with the patient or authorized representative who has indicated his/her understanding and acceptance.     Plan Discussed with:   Anesthesia Plan Comments: (Tylenol, LTA)        Anesthesia Quick Evaluation

## 2014-05-09 NOTE — Interval H&P Note (Signed)
[facility]  History and Physical Update  The patient was interviewed and re-examined.  The patient's previous History and Physical has been reviewed and is unchanged from consult except for: interval development of R arm rash.  There is no change in the plan of care: R CEA for sx ICA >80%.  Adele Barthel, MD [facility] [facility] Office: [phone] Pager: [phone]  05/09/2014, 7:17 AM

## 2014-05-09 NOTE — Progress Notes (Signed)
Pt in for surgery, with rash like area noted to right arm and shoulder area.  Pt states that he is being treated for shingles. Pt placed in neg pressure room upon arriving to our unit, and placed on contact and airborne precautions.  Pt did wear a mask when transported to OR.  Dr. Bridgett Larsson made aware of precautions.

## 2014-05-09 NOTE — Progress Notes (Signed)
Utilization review completed.  

## 2014-05-09 NOTE — H&P (View-Only) (Signed)
Established Carotid Patient  History of Present Illness  Jesse Avila is a 63 y.o. (1951/01/28) male who presents with chief complaint: pre-operative counseling.   Previous carotid studies demonstrated: RICA >44% stenosis, LICA <01% stenosis.  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  The patient has never had amaurosis fugax or monocular blindness.  The patient has never had facial drooping or hemiplegia.  He has had L upper and lower extremity weakness and discoordination.  The patient has never had receptive or expressive aphasia.  The patient's previous neurologic deficits have resolved.  Pt was sent to Dr. Domenic Polite for pre-operative risk stratification: low risk.  Past Medical History  Diagnosis Date  . Hyperlipidemia   . Peripheral vascular disease   . Essential hypertension, benign   . 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 GERD (gastroesophageal reflux disease)   . Gout   . ED (erectile dysfunction)   . DJD (degenerative joint disease) of knee   . Carotid artery occlusion   . History of stroke     Posterior/inferior right frontal lobe     Past Surgical History  Procedure Laterality Date  . Knee arthroscopy Right 11/2009  . Meniscus repair  11/2009    History   Social History  . Marital Status: Married    Spouse Name: N/A    Number of Children: N/A  . Years of Education: N/A   Occupational History  . Not on file.   Social History Main Topics  . Smoking status: Former Smoker -- 1.00 packs/day for 30 years    Types: Cigarettes    Quit date: 08/04/2009  . Smokeless tobacco: Never Used  . Alcohol Use: 7.2 oz/week    12 Cans of beer per week  . Drug Use: No  . Sexual Activity: No   Other Topics Concern  . Not on file   Social History Narrative  . No narrative on file    Family History  Problem Relation Age of Onset  . Diabetes Mother   . Hypertension Mother   . Heart disease Mother    Coronary Artery Bypass Graft  . Hyperlipidemia Mother   . Heart attack Mother   . Hypertension Father   . Hyperlipidemia Sister   . Stroke Sister     Current Outpatient Prescriptions on File Prior to Visit  Medication Sig Dispense Refill  . aspirin 81 MG tablet Take 1 tablet (81 mg total) by mouth daily.  30 tablet    . cilostazol (PLETAL) 100 MG tablet Take 100 mg by mouth 2 (two) times daily.      . clopidogrel (PLAVIX) 75 MG tablet Take 1 tablet (75 mg total) by mouth daily.  30 tablet  0  . fenofibrate 160 MG tablet Take 160 mg by mouth daily.      Marland Kitchen glimepiride (AMARYL) 1 MG tablet Take 1 mg by mouth daily with breakfast.       . metFORMIN (GLUCOPHAGE) 500 MG tablet Take 1 tablet (500 mg total) by mouth 3 (three) times daily.      Marland Kitchen olmesartan (BENICAR) 40 MG tablet Take 20 mg by mouth daily.       . rosuvastatin (CRESTOR) 40 MG tablet Take 20 mg by mouth daily. For cholesterol      . zolpidem (AMBIEN) 10 MG tablet Take 1 tablet (10 mg total) by mouth at bedtime as needed for sleep.  30 tablet  0  No current facility-administered medications on file prior to visit.    Allergies  Allergen Reactions  . Cialis [Tadalafil] Other (See Comments)    Headache    REVIEW OF SYSTEMS: (Positives checked otherwise negative)   CARDIOVASCULAR: []  chest pain, []  chest pressure, []  palpitations, []  shortness of breath when laying flat, []  shortness of breath with exertion, []  pain in feet when walking, []  pain in feet when laying flat, []  history of blood clot in veins (DVT), []  history of phlebitis, []  swelling in legs, []  varicose veins   PULMONARY: []  productive cough, []  asthma, []  wheezing   NEUROLOGIC: []  weakness in arms or legs, []  numbness in arms or legs, []  difficulty speaking or slurred speech, []  temporary loss of vision in one eye, []  dizziness   HEMATOLOGIC: []  bleeding problems, []  problems with blood clotting too easily   MUSCULOSKEL: []  joint pain, []  joint swelling    GASTROINTEST: []  vomiting blood, []  blood in stool   GENITOURINARY: []  burning with urination, []  blood in urine   PSYCHIATRIC: []  history of major depression   INTEGUMENTARY: []  rashes, []  ulcers   CONSTITUTIONAL: []  fever, []  chills   Physical Examination  Filed Vitals:   05/05/14 1126 05/05/14 1128  BP: 121/67 114/69  Pulse: 92 93  Height: 6' (1.829 m)   Weight: 215 lb (97.523 kg)   SpO2: 93%    Body mass index is 29.15 kg/(m^2).  General: A&O x 3, WDWN   Eyes: PERRLA, EOMI   Pulmonary: Sym exp, good air movt, CTAB, no rales, rhonchi, & wheezing   Cardiac: RRR, Nl S1, S2, no Murmurs, rubs or gallops   Vascular:  Vessel  Right  Left   Radial  Palpable  Palpable   Brachial  Palpable  Palpable   Carotid  Palpable, without bruit  Palpable, without bruit   Aorta  Not palpable  N/A   Femoral  Palpable  Palpable   Popliteal  Not palpable  Not palpable   PT  Not Palpable  Not Palpable   DP  Palpable  Palpable    Gastrointestinal: soft, NTND, -G/R, - HSM, - masses, - CVAT B   Musculoskeletal: M/S 5/5 throughout , Extremities without ischemic changes   Neurologic: CN 2-12 intact , Pain and light touch intact in extremities , Motor exam as listed above    Medical Decision Making  Jesse Avila is a 63 y.o. male who presents with: Sx R ICA stenosis >80%, Asx L ICA stenosis <80%   Based on the patient's vascular studies and examination, I have offered the patient: R CEA.  I discussed with the patient the risks, benefits, and alternatives to carotid endarterectomy.    The patient is aware that the risks of carotid endarterectomy include but are not limited to: bleeding, infection, stroke, myocardial infarction, death, cranial nerve injuries both temporary and permanent, neck hematoma, possible airway compromise, labile blood pressure post-operatively, cerebral hyperperfusion syndrome, and possible need for additional interventions in the future.   The patient is  aware of the risks and agrees to proceed forward with the procedure.  This is scheduled for the 6th OCT 2015.  .I discussed in depth with the patient the nature of atherosclerosis, and emphasized the importance of maximal medical management including strict control of blood pressure, blood glucose, and lipid levels, antiplatelet agents, obtaining regular exercise, and cessation of smoking.    The patient is aware that without maximal medical management the underlying atherosclerotic disease process will progress,  limiting the benefit of any interventions. The patient is currently on a statin: Crestor. The patient is currently on an anti-platelet: ASA, Plavix.  Thank you for allowing [place] to participate in this patient's care.  Adele Barthel, MD [facility] Office: [phone] Pager: [phone]  05/05/2014, 1:02 PM

## 2014-05-09 NOTE — OR Nursing (Signed)
Cuff pressure > a line, considering difference in degree to which treating SBP.

## 2014-05-09 NOTE — Discharge Summary (Signed)
Discharge Summary     ZYHIR CAPPELLA [birth year] 63 y.o. male  [id number]  Admission Date: 05/09/2014  Discharge Date: 05/10/14  Physician: Conrad Florissant, MD  Admission Diagnosis: Carotid stenosis, right    HPI:   This is a 63 y.o. male who presents with chief complaint: pre-operative counseling. Previous carotid studies demonstrated: RICA >16% stenosis, LICA <60% stenosis. 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 The patient has never had amaurosis fugax or monocular blindness. The patient has never had facial drooping or hemiplegia. He has had L upper and lower extremity weakness and discoordination. The patient has never had receptive or expressive aphasia. The patient's previous neurologic deficits have resolved. Pt was sent to Dr. Domenic Polite for pre-operative risk stratification: low risk.  Hospital Course:  The patient was admitted to the hospital and taken to the operating room on 05/09/2014 and underwent right carotid endarterectomy.  The pt tolerated the procedure well and was transported to the PACU in good condition.  By POD 1, the pt neuro status is in tact.  He does have a L facial anesthesia, which he had pre-operatively.  This is improved from day of surgery.  He denies any trouble swallowing.  The remainder of the hospital course consisted of increasing mobilization and increasing intake of solids without difficulty.  CBC    Component Value Date/Time   WBC 5.0 05/10/2014 0505   RBC 2.92* 05/10/2014 0505   HGB 9.2* 05/10/2014 0505   HCT 27.4* 05/10/2014 0505   PLT 123* 05/10/2014 0505   MCV 93.8 05/10/2014 0505   MCH 31.5 05/10/2014 0505   MCHC 33.6 05/10/2014 0505   RDW 12.9 05/10/2014 0505   LYMPHSABS 1.3 04/14/2014 1127   MONOABS 0.3 04/14/2014 1127   EOSABS 0.3 04/14/2014 1127   BASOSABS 0.0 04/14/2014 1127    BMET    Component Value Date/Time   NA 141 05/10/2014 0505   K 4.0 05/10/2014 0505   CL 105 05/10/2014 0505   CO2 23 05/10/2014 0505   GLUCOSE 124* 05/10/2014 0505   BUN  16 05/10/2014 0505   CREATININE 1.09 05/10/2014 0505   CALCIUM 8.7 05/10/2014 0505   GFRNONAA 71* 05/10/2014 0505   GFRAA 83* 05/10/2014 0505      Recent Labs  05/08/14 1559  INR 1.03    Discharge Instructions:   The patient is discharged with extensive instructions on wound care and progressive ambulation.  They are instructed not to drive or perform any heavy lifting until returning to see the physician in his office.  Discharge Instructions   CAROTID Sugery: Call MD for difficulty swallowing or speaking; weakness in arms or legs that is a new symtom; severe headache.  If you have increased swelling in the neck and/or  are having difficulty breathing, CALL [phone]    Complete by:  As directed      Call MD for:  redness, tenderness, or signs of infection (pain, swelling, bleeding, redness, odor or green/yellow discharge around incision site)    Complete by:  As directed      Call MD for:  severe or increased pain, loss or decreased feeling  in affected limb(s)    Complete by:  As directed      Call MD for:  temperature >100.5    Complete by:  As directed      Discharge wound care:    Complete by:  As directed   Shower daily with soap and water starting 05/11/14     Driving Restrictions  Complete by:  As directed   No driving for 2 weeks     Lifting restrictions    Complete by:  As directed   No lifting for 2 weeks     Resume previous diet    Complete by:  As directed            Discharge Diagnosis:  Carotid stenosis, right   Secondary Diagnosis: Patient Active Problem List   Diagnosis Date Noted  . Carotid stenosis 05/09/2014  . Occlusion and stenosis of carotid artery without mention of cerebral infarction 04/21/2014  . History of stroke 04/21/2014  . Carotid stenosis with cerebral infarction less than 8 weeks ago 04/16/2014  . Diabetes mellitus, type 2 04/14/2014  . Obesity 04/14/2014  . Insomnia 10/18/2013  . Preop cardiovascular exam 12/12/2011  . DJD (degenerative  joint disease) of knee   . ED (erectile dysfunction)   . GERD (gastroesophageal reflux disease)   . Essential hypertension, benign   . Peripheral vascular disease   . Hyperlipidemia   . Atherosclerosis of native arteries of the extremities with intermittent claudication 11/19/2011   Past Medical History  Diagnosis Date  . Hyperlipidemia   . Peripheral vascular disease   . Essential hypertension, benign   . 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 Gout   . ED (erectile dysfunction)   . DJD (degenerative joint disease) of knee   . Carotid artery occlusion   . History of stroke     Posterior/inferior right frontal lobe   . Stroke     TIA  2015  . Shingles 05/09/14      Medication List    TAKE these medications       ACCU-CHEK SMARTVIEW test strip  Generic drug:  glucose blood     acetaminophen 500 MG tablet  Commonly known as:  TYLENOL  Take 1,000 mg by mouth every 6 (six) hours as needed for mild pain.     aspirin 81 MG tablet  Take 1 tablet (81 mg total) by mouth daily.     cilostazol 100 MG tablet  Commonly known as:  PLETAL  Take 100 mg by mouth 2 (two) times daily.     clopidogrel 75 MG tablet  Commonly known as:  PLAVIX  Take 1 tablet (75 mg total) by mouth daily.     fenofibrate 160 MG tablet  Take 160 mg by mouth daily.     glimepiride 1 MG tablet  Commonly known as:  AMARYL  Take 1 mg by mouth daily with breakfast.     metFORMIN 500 MG tablet  Commonly known as:  GLUCOPHAGE  Take 1 tablet (500 mg total) by mouth 3 (three) times daily.     NAPROXEN DR 500 MG EC tablet  Generic drug:  naproxen     olmesartan 40 MG tablet  Commonly known as:  BENICAR  Take 20 mg by mouth daily.     oxyCODONE 5 MG immediate release tablet  Commonly known as:  ROXICODONE  Take 1 tablet (5 mg total) by mouth every 6 (six) hours as needed for severe pain.     rosuvastatin 40 MG tablet  Commonly  known as:  CRESTOR  Take 20 mg by mouth daily. For cholesterol     valACYclovir 1000 MG tablet  Commonly known as:  VALTREX  Take 1 tablet (1,000 mg total) by mouth 3 (three) times daily.      ASK your  doctor about these medications       zolpidem 10 MG tablet  Commonly known as:  AMBIEN  Take 1 tablet (10 mg total) by mouth at bedtime as needed for sleep.        Roxicodone #20 No Refill  Disposition: home  Patient's condition: is Good  Follow up: 1. Dr.  Bridgett Larsson in 2 weeks.   Leontine Locket, PA-C [facility] [phone]  Addendum  I have independently interviewed and examined the patient, and I agree with the physician assistant's discharge summary.  This patient underwent R CEA which was remarkable for essentially tandem lesions in the internal carotid artery and common carotid artery.  He had findings suggestive of a chronic carotid dissection vs fibromuscular dysplasia.  The case went as planned and on POD #1, pt had no neurologic deficits.  He did have some left facial anesthesia which was improved from immediate post-operative.  He had these sx previously.   The patient had no neck hematoma.  I discussed with the patient his residual proximal common carotid artery disease and the intra-operative findings.  The patient will follow up in 2 weeks for reinspection of the neck.    Adele Barthel, MD [facility] [place] Office: [phone] Pager: [phone]  05/10/2014, 11:27 AM   --- For VQI Registry use --- Instructions: Press F2 to tab through selections.  Delete question if not applicable.   Modified Rankin score at D/C (0-6): 0  IV medication needed for:  1. Hypertension: No 2. Hypotension: No  Post-op Complications: No  1. Post-op CVA or TIA: No  If yes: Event classification (right eye, left eye, right cortical, left cortical, verterobasilar, other): n/a  If yes: Timing of event (intra-op, <6 hrs post-op, >=6 hrs  post-op, unknown): n/a  2. CN injury: No  If yes: CN n/a injuried   3. Myocardial infarction: No  If yes: Dx by (EKG or clinical, Troponin): n/a  4.  CHF: No  5.  Dysrhythmia (new): No  6. Wound infection: No  7. Reperfusion symptoms: No  8. Return to OR: No  If yes: return to OR for (bleeding, neurologic, other CEA incision, other): n/a  Discharge medications: Statin use:  Yes If No: [ ]  For Medical reasons, [ ]  Non-compliant, [ ]  Not-indicated ASA use:  Yes  If No: [ ]  For Medical reasons, [ ]  Non-compliant, [ ]  Not-indicated Beta blocker use:  No If No: [ ]  For Medical reasons, [ ]  Non-compliant, [ ]  Not-indicated ACE-Inhibitor use:  No If No: [ ]  For Medical reasons, [ ]  Non-compliant, [ ]  Not-indicated P2Y12 Antagonist use: Yes, [x ] Plavix, [ ]  Plasugrel, [ ]  Ticlopinine, [ ]  Ticagrelor, [ ]  Other, [ ]  No for medical reason, [ ]  Non-compliant, [ ]  Not-indicated Anti-coagulant use:  No, [ ]  Warfarin, [ ]  Rivaroxaban, [ ]  Dabigatran, [ ]  Other, [ ]  No for medical reason, [ ]  Non-compliant, [ ]  Not-indicated

## 2014-05-09 NOTE — Progress Notes (Addendum)
  Day of Surgery Note    Subjective:  No complaints  Filed Vitals:   05/09/14 1315  BP:   Pulse: 97  Temp: 98.5 F (36.9 C)  Resp: 21    Incisions:   C/d/i without hematoma Neuro:  In tact; no difficulty swallowing ice chips. Right marginal mandibular neuropraxia Cardiac:  regular Lungs:  Non labored   Assessment/Plan:  This is a 63 y.o. male who is s/p right CEA today  -pt is doing well this afternoon.  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 -no difficulty swallowing, however, he has only had ice chips -mobilize pt -should discharge tomorrow.   Leontine Locket, PA-C 05/09/2014 2:48 PM

## 2014-05-09 NOTE — Transfer of Care (Signed)
Immediate Anesthesia Transfer of Care Note  Patient: Jesse Avila  Procedure(s) Performed: Procedure(s): RIGHT CAROTID ENDARTERECTOMY WITH PATCH ANGIOPLASTY (Right)  Patient Location: PACU  Anesthesia Type:General  Level of Consciousness: awake, alert  and oriented  Airway & Oxygen Therapy: Patient Spontanous Breathing and Patient connected to nasal cannula oxygen  Post-op Assessment: Report given to PACU RN, Post -op Vital signs reviewed and stable, Patient moving all extremities X 4 and Patient able to stick tongue midline  Post vital signs: Reviewed and stable  Complications: No apparent anesthesia complications

## 2014-05-10 LAB — CBC
HEMATOCRIT: 27.4 % — AB (ref 39.0–52.0)
HEMOGLOBIN: 9.2 g/dL — AB (ref 13.0–17.0)
MCH: 31.5 pg (ref 26.0–34.0)
MCHC: 33.6 g/dL (ref 30.0–36.0)
MCV: 93.8 fL (ref 78.0–100.0)
Platelets: 123 10*3/uL — ABNORMAL LOW (ref 150–400)
RBC: 2.92 MIL/uL — ABNORMAL LOW (ref 4.22–5.81)
RDW: 12.9 % (ref 11.5–15.5)
WBC: 5 10*3/uL (ref 4.0–10.5)

## 2014-05-10 LAB — BASIC METABOLIC PANEL
Anion gap: 13 (ref 5–15)
BUN: 16 mg/dL (ref 6–23)
CALCIUM: 8.7 mg/dL (ref 8.4–10.5)
CO2: 23 meq/L (ref 19–32)
Chloride: 105 mEq/L (ref 96–112)
Creatinine, Ser: 1.09 mg/dL (ref 0.50–1.35)
GFR calc Af Amer: 83 mL/min — ABNORMAL LOW (ref >90)
GFR calc non Af Amer: 71 mL/min — ABNORMAL LOW (ref >90)
GLUCOSE: 124 mg/dL — AB (ref 70–99)
Potassium: 4 mEq/L (ref 3.7–5.3)
Sodium: 141 mEq/L (ref 137–147)

## 2014-05-10 LAB — GLUCOSE, CAPILLARY: GLUCOSE-CAPILLARY: 137 mg/dL — AB (ref 70–99)

## 2014-05-10 NOTE — Progress Notes (Addendum)
Received orders to D/C pt to home, pt and wife aware of D/C orders. D/C instructions given to pt, pt verbalizes understanding of instructions. No s/s of acute distress noted, VS stable. Pt D/C'd to home accompanied by wife. Pt transported to car via wheelchair. All belongings sent with patient.

## 2014-05-10 NOTE — Progress Notes (Addendum)
  Progress Note    05/10/2014 7:23 AM 1 Day Post-Op  Subjective:  No complaints--ready to go home  afebrile HR 100's-110's yesterday afternon.  70's-80's overnight. Regular 220'U-542'H systolic 06% RA  Filed Vitals:   05/10/14 0300  BP: 110/59  Pulse: 81  Temp: 97.9 F (36.6 C)  Resp: 16     Physical Exam: Neuro:  In tact.  Left facial anes. slightly improved this am; no swallowing issues. Incision:  C/d/i without hematoma.  CBC    Component Value Date/Time   WBC 5.0 05/10/2014 0505   RBC 2.92* 05/10/2014 0505   HGB 9.2* 05/10/2014 0505   HCT 27.4* 05/10/2014 0505   PLT 123* 05/10/2014 0505   MCV 93.8 05/10/2014 0505   MCH 31.5 05/10/2014 0505   MCHC 33.6 05/10/2014 0505   RDW 12.9 05/10/2014 0505   LYMPHSABS 1.3 04/14/2014 1127   MONOABS 0.3 04/14/2014 1127   EOSABS 0.3 04/14/2014 1127   BASOSABS 0.0 04/14/2014 1127    BMET    Component Value Date/Time   NA 141 05/10/2014 0505   K 4.0 05/10/2014 0505   CL 105 05/10/2014 0505   CO2 23 05/10/2014 0505   GLUCOSE 124* 05/10/2014 0505   BUN 16 05/10/2014 0505   CREATININE 1.09 05/10/2014 0505   CALCIUM 8.7 05/10/2014 0505   GFRNONAA 71* 05/10/2014 0505   GFRAA 83* 05/10/2014 0505     Intake/Output Summary (Last 24 hours) at 05/10/14 0723 Last data filed at 05/10/14 0500  Gross per 24 hour  Intake 4596.25 ml  Output   3600 ml  Net 996.25 ml      Assessment/Plan:  This is a 63 y.o. male who is s/p right CEA 1 Day Post-Op  -pt is doing well this am. -pt neuro exam is in tact -pt has ambulated -pt has voided -discharge home today-f/u with Dr. Bridgett Larsson in 2 weeks. (05/26/14 @ 0845)  Leontine Locket, PA-C [facility] [phone]  Addendum  I have independently interviewed and examined the patient, and I agree with the physician assistant's findings.  No hematoma.  Drain removed.  Neuro intact.  Pt does not have a  Marginal mandibular neuropraxia rather he has some residual sx from his prior CVA, in  this case L facial anesthesia which is resolving.  Suspect this exacerbation is due to some intraop hypotension.  Intraop findings discussed with patient including residual disease in proximal CCA.  Pt will follow up in 2 weeks for a wound check.  Adele Barthel, MD [facility] [place] Office: [phone] Pager: [phone]  05/10/2014, 8:26 AM

## 2014-05-11 ENCOUNTER — Encounter (HOSPITAL_COMMUNITY): Payer: Self-pay | Admitting: Vascular Surgery

## 2014-05-25 ENCOUNTER — Encounter: Payer: Self-pay | Admitting: Vascular Surgery

## 2014-05-26 ENCOUNTER — Ambulatory Visit (INDEPENDENT_AMBULATORY_CARE_PROVIDER_SITE_OTHER): Payer: Self-pay | Admitting: Vascular Surgery

## 2014-05-26 ENCOUNTER — Encounter: Payer: Self-pay | Admitting: Vascular Surgery

## 2014-05-26 VITALS — BP 124/65 | HR 96 | Temp 98.6°F | Ht 73.0 in | Wt 220.0 lb

## 2014-05-26 DIAGNOSIS — IMO0002 Reserved for concepts with insufficient information to code with codable children: Secondary | ICD-10-CM

## 2014-05-26 DIAGNOSIS — I639 Cerebral infarction, unspecified: Secondary | ICD-10-CM

## 2014-05-26 DIAGNOSIS — Z48812 Encounter for surgical aftercare following surgery on the circulatory system: Secondary | ICD-10-CM

## 2014-05-26 NOTE — Addendum Note (Signed)
Addended by: Mena Goes on: 05/26/2014 09:50 AM   Modules accepted: Orders

## 2014-05-26 NOTE — Progress Notes (Signed)
Postoperative Visit   History of Present Illness  Jesse Avila is a 63 y.o. male who presents for postoperative follow-up for: R CEA (Date: 05/09/14).  The patient's neck incision is healed.  The patient has had no stroke or TIA symptoms.  For VQI Use Only  PRE-ADM LIVING: Home  AMB STATUS: Ambulatory  History   Social History  . Marital Status: Married    Spouse Name: N/A    Number of Children: N/A  . Years of Education: N/A   Occupational History  . Not on file.   Social History Main Topics  . Smoking status: Former Smoker -- 1.00 packs/day for 30 years    Types: Cigarettes    Quit date: 08/04/2009  . Smokeless tobacco: Never Used  . Alcohol Use: 7.2 oz/week    12 Cans of beer per week  . Drug Use: No  . Sexual Activity: No   Other Topics Concern  . Not on file   Social History Narrative  . No narrative on file    Current Outpatient Prescriptions on File Prior to Visit  Medication Sig Dispense Refill  . ACCU-CHEK SMARTVIEW test strip       . acetaminophen (TYLENOL) 500 MG tablet Take 1,000 mg by mouth every 6 (six) hours as needed for mild pain.      Marland Kitchen aspirin 81 MG tablet Take 1 tablet (81 mg total) by mouth daily.  30 tablet    . cilostazol (PLETAL) 100 MG tablet Take 100 mg by mouth 2 (two) times daily.      . clopidogrel (PLAVIX) 75 MG tablet Take 1 tablet (75 mg total) by mouth daily.  30 tablet  0  . fenofibrate 160 MG tablet Take 160 mg by mouth daily.      Marland Kitchen glimepiride (AMARYL) 1 MG tablet Take 1 mg by mouth daily with breakfast.       . metFORMIN (GLUCOPHAGE) 500 MG tablet Take 1 tablet (500 mg total) by mouth 3 (three) times daily.      Marland Kitchen NAPROXEN DR 500 MG EC tablet       . olmesartan (BENICAR) 40 MG tablet Take 20 mg by mouth daily.       Marland Kitchen oxyCODONE (ROXICODONE) 5 MG immediate release tablet Take 1 tablet (5 mg total) by mouth every 6 (six) hours as needed for severe pain.  20 tablet  0  . rosuvastatin (CRESTOR) 40 MG tablet Take 20 mg by  mouth daily. For cholesterol      . valACYclovir (VALTREX) 1000 MG tablet Take 1 tablet (1,000 mg total) by mouth 3 (three) times daily.  21 tablet  0  . zolpidem (AMBIEN) 10 MG tablet Take 1 tablet (10 mg total) by mouth at bedtime as needed for sleep.  30 tablet  0   No current facility-administered medications on file prior to visit.    Physical Examination  Filed Vitals:   05/26/14 0844  BP: 124/65  Pulse:   Temp:     R Neck: Incision is healed Neuro: CN 2-12 are intact , Motor strength is 5/5 bilaterally, sensation is grossly intact  Medical Decision Making  Jesse Avila is a 63 y.o. male who presents s/p R CEA.  The patient's neck incision is healing with no stroke symptoms. I discussed in depth with the patient the nature of atherosclerosis, and emphasized the importance of maximal medical management including strict control of blood pressure, blood glucose, and lipid levels, obtaining regular exercise,  anti-platelet use and cessation of smoking.   The patient is currently on an antiplatelet: Plavix.  Pt can stop ASA use. The patient is currently on a statin: Crestor. The patient is aware that without maximal medical management the underlying atherosclerotic disease process will progress, limiting the benefit of any interventions. The patient's surveillance will included routine carotid duplex studies which will be completed in: 6 months, at which time the patient will be re-evaluated.   I emphasized the importance of routine surveillance of the carotid arteries as recurrence of stenosis is possible, especially with proper management of underlying atherosclerotic disease. The patient agrees to participate in their maximal medical care and routine surveillance.  Thank you for allowing [place] to participate in this patient's care.  Adele Barthel, MD [facility] [place] Office: [phone] Pager: [phone]

## 2014-06-09 ENCOUNTER — Ambulatory Visit (INDEPENDENT_AMBULATORY_CARE_PROVIDER_SITE_OTHER): Payer: 59 | Admitting: Podiatry

## 2014-06-09 VITALS — BP 101/60 | HR 104 | Resp 16

## 2014-06-09 DIAGNOSIS — Q667 Congenital pes cavus: Secondary | ICD-10-CM

## 2014-06-09 DIAGNOSIS — M779 Enthesopathy, unspecified: Secondary | ICD-10-CM

## 2014-06-09 DIAGNOSIS — M216X9 Other acquired deformities of unspecified foot: Secondary | ICD-10-CM

## 2014-06-09 MED ORDER — TRIAMCINOLONE ACETONIDE 10 MG/ML IJ SUSP
10.0000 mg | Freq: Once | INTRAMUSCULAR | Status: AC
  Administered 2014-06-09: 10 mg

## 2014-06-09 NOTE — Progress Notes (Signed)
Subjective:     Patient ID: Jesse Avila, male   DOB: [birth year], 63 y.o.   MRN: [id number]  HPIpatient presents stating I have inflammation around my fifth metatarsal head right over left that's driving me crazy. Also has corns that are sore and states that the orthotics have not been helpful   Review of Systems     Objective:   Physical Exam Neurovascular status is significantly diminished as far as his pulses and he has not seen a vascular doctors here and appears to have lower limb vascular disease. 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 I've encouraged him that we may send him to a vascular doctor here depending on how he responds

## 2014-07-07 ENCOUNTER — Encounter: Payer: Self-pay | Admitting: Podiatry

## 2014-07-07 ENCOUNTER — Ambulatory Visit (INDEPENDENT_AMBULATORY_CARE_PROVIDER_SITE_OTHER): Payer: 59 | Admitting: Podiatry

## 2014-07-07 DIAGNOSIS — I739 Peripheral vascular disease, unspecified: Secondary | ICD-10-CM

## 2014-07-07 DIAGNOSIS — M779 Enthesopathy, unspecified: Secondary | ICD-10-CM

## 2014-07-07 DIAGNOSIS — M216X9 Other acquired deformities of unspecified foot: Secondary | ICD-10-CM

## 2014-07-07 MED ORDER — TRIAMCINOLONE ACETONIDE 10 MG/ML IJ SUSP
10.0000 mg | Freq: Once | INTRAMUSCULAR | Status: AC
  Administered 2014-07-07: 10 mg

## 2014-07-09 NOTE — Progress Notes (Signed)
Subjective:     Patient ID: [name], male   DOB: [birth year], 63 y.o.   MRN: [id number]  HPI patient states the right foot did quite a bit better for a few weeks but it's now returned and I'm still having a lot of pain on the bottom   Review of Systems     Objective:   Physical Exam Neurovascular status is significantly diminished bilateral with a history of stents and a history of evaluation in [facility] for this particular condition. He does have keratotic lesion formation sub-fifth metatarsal right with fluid buildup and inflammation noted upon palpation    Assessment:     Severe lesions with bony structural issues and inflammatory capsulitis with vascular disease as overlying factor    Plan:     I'm referring him for vascular evaluation and possibility as to whether he can be revascularized with the hope that I could eventually do a fifth metatarsal head resection to resolve his symptoms. Today injected the plantar capsule 3 mg dexamethasone Kenalog 5 mg Xylocaine and debrided the lesions fully to try to give him temporary relief until we can get a better evaluation as to what we can do to help him long-term

## 2014-07-10 ENCOUNTER — Telehealth (HOSPITAL_COMMUNITY): Payer: Self-pay | Admitting: *Deleted

## 2014-07-13 ENCOUNTER — Encounter: Payer: Self-pay | Admitting: Vascular Surgery

## 2014-07-13 ENCOUNTER — Encounter (HOSPITAL_COMMUNITY): Payer: Self-pay | Admitting: Vascular Surgery

## 2014-07-14 ENCOUNTER — Ambulatory Visit (INDEPENDENT_AMBULATORY_CARE_PROVIDER_SITE_OTHER): Payer: Self-pay | Admitting: Vascular Surgery

## 2014-07-14 ENCOUNTER — Other Ambulatory Visit: Payer: Self-pay | Admitting: *Deleted

## 2014-07-14 ENCOUNTER — Other Ambulatory Visit: Payer: Self-pay | Admitting: Vascular Surgery

## 2014-07-14 ENCOUNTER — Ambulatory Visit (HOSPITAL_COMMUNITY)
Admission: RE | Admit: 2014-07-14 | Discharge: 2014-07-14 | Disposition: A | Discharge status: Home / Self Care | Payer: 59 | Source: Ambulatory Visit | Attending: Vascular Surgery | Admitting: Vascular Surgery

## 2014-07-14 ENCOUNTER — Encounter: Payer: Self-pay | Admitting: Vascular Surgery

## 2014-07-14 VITALS — BP 126/69 | HR 81 | Resp 14 | Ht 73.0 in | Wt 218.0 lb

## 2014-07-14 DIAGNOSIS — Z95828 Presence of other vascular implants and grafts: Secondary | ICD-10-CM | POA: Insufficient documentation

## 2014-07-14 DIAGNOSIS — I70219 Atherosclerosis of native arteries of extremities with intermittent claudication, unspecified extremity: Secondary | ICD-10-CM

## 2014-07-14 DIAGNOSIS — Z87891 Personal history of nicotine dependence: Secondary | ICD-10-CM | POA: Diagnosis not present

## 2014-07-14 DIAGNOSIS — I739 Peripheral vascular disease, unspecified: Secondary | ICD-10-CM

## 2014-07-14 DIAGNOSIS — E785 Hyperlipidemia, unspecified: Secondary | ICD-10-CM | POA: Insufficient documentation

## 2014-07-14 DIAGNOSIS — E119 Type 2 diabetes mellitus without complications: Secondary | ICD-10-CM | POA: Diagnosis not present

## 2014-07-14 DIAGNOSIS — Z4589 Encounter for adjustment and management of other implanted devices: Secondary | ICD-10-CM | POA: Diagnosis not present

## 2014-07-14 DIAGNOSIS — I10 Essential (primary) hypertension: Secondary | ICD-10-CM | POA: Diagnosis not present

## 2014-07-14 DIAGNOSIS — I70213 Atherosclerosis of native arteries of extremities with intermittent claudication, bilateral legs: Secondary | ICD-10-CM

## 2014-07-14 NOTE — Progress Notes (Signed)
    Established Intermittent Claudication  History of Present Illness  Jesse Avila is a 63 y.o. ([birth year]) male who presents with chief complaint: B leg cramping.  This patient previously underwent B CIA stenting by Dr. Scot Dock (03/29/12).  He then required R CIA angioplasty (05/10/12).  The patient's symptoms have not progressed.  The patient's symptoms are: intermittent claudication at 100 feet.  He notes this interferes with walking on his job.  He was recently evaluated by a Podiatrist for ?plantar spur and pt wanted to be re-evaluated to see if he could heal a proposed osteotomy.  The patient's treatment regimen currently included: maximal medical management.  The patient's PMH, PSH, SH, FamHx, Med, and Allergies are unchanged from 05/26/14.  On ROS today: no rest pain, no ulcers  Physical Examination  Filed Vitals:   07/14/14 1541  BP: 126/69  Pulse: 81  Resp: 14  Height: 6\' 1"  (1.854 m)  Weight: 218 lb (98.884 kg)   Body mass index is 28.77 kg/(m^2).  General: A&O x 3, WDWN  Head: [place]/AT  Ear/Nose/Throat: Hearing grossly intact, nares w/o erythema or drainage, oropharynx w/o Erythema/Exudate, Mallampati score: 3  Eyes: PERRLA, EOMI  Neck: Supple, no nuchal rigidity, no palpable LAD  Pulmonary: Sym exp, good air movt, CTAB, no rales, rhonchi, & wheezing  Cardiac: RRR, Nl S1, S2, no Murmurs, rubs or gallops  Vascular: Vessel Right Left  Radial Palpable Palpable  Brachial Palpable Palpable  Carotid Palpable, without bruit Palpable, without bruit  Aorta Not palpable N/A  Femoral Palpable Palpable  Popliteal Not palpable Not palpable  PT Not Palpable Not Palpable  DP Faintly Palpable Faintly Palpable   Gastrointestinal: soft, NTND, -G/R, - HSM, - masses, - CVAT B  Musculoskeletal: M/S 5/5 throughout , Extremities without ischemic changes   Neurologic: CN 2-12 intact , Pain and light touch intact in extremities , Motor exam as listed  above  Psychiatric: Judgment intact, Mood & affect appropriate for pt's clinical situation  Dermatologic: See M/S exam for extremity exam, no rashes otherwise noted  Lymph : No Cervical, Axillary, or Inguinal lymphadenopathy   Non-Invasive Vascular Imaging ABI (Date: 07/14/2014)  R: 0.60 (0.54), DP: mono, AJ:GOTL , TBI: 0.28  L: 0.64 (0.58), DP: mono, PT: mono, TBI: 0.33  Medical Decision Making  Jesse Avila is a 63 y.o. male who presents with:  bilateral leg intermittent claudication without evidence of critical limb ischemia.  Based on the patient's vascular studies and examination, I have offered the patient: maximal medical mgmt and walking plan.  I discussed in depth with the patient the nature of atherosclerosis, and emphasized the importance of maximal medical management including strict control of blood pressure, blood glucose, and lipid levels, antiplatelet agents, obtaining regular exercise, and cessation of smoking.    The patient is aware that without maximal medical management the underlying atherosclerotic disease process will progress, limiting the benefit of any interventions.  The patient is currently on a statin: Crestor.  The patient is currently on an anti-platelet: Plavix.  The patient is also already on Pletal.  He is going to follow up in 6 months with another BLE ABI.  We will see if his sx have improved at that point.  Thank you for allowing [place] to participate in this patient's care.  Adele Barthel, MD [facility] [place] Office: [phone] Pager: [phone]  07/14/2014, 4:29 PM

## 2014-08-18 ENCOUNTER — Ambulatory Visit: Payer: 59 | Admitting: Podiatry

## 2014-11-24 ENCOUNTER — Other Ambulatory Visit (HOSPITAL_COMMUNITY): Payer: 59

## 2014-11-24 ENCOUNTER — Ambulatory Visit: Payer: 59 | Admitting: Vascular Surgery

## 2014-11-27 ENCOUNTER — Other Ambulatory Visit (HOSPITAL_COMMUNITY): Payer: Self-pay

## 2014-11-27 ENCOUNTER — Ambulatory Visit: Payer: Self-pay | Admitting: Vascular Surgery

## 2014-11-27 ENCOUNTER — Encounter (HOSPITAL_COMMUNITY): Payer: Self-pay

## 2014-11-30 ENCOUNTER — Encounter: Payer: Self-pay | Admitting: Vascular Surgery

## 2014-11-30 ENCOUNTER — Other Ambulatory Visit: Payer: Self-pay | Admitting: *Deleted

## 2014-11-30 DIAGNOSIS — Z01818 Encounter for other preprocedural examination: Secondary | ICD-10-CM

## 2014-11-30 DIAGNOSIS — I739 Peripheral vascular disease, unspecified: Secondary | ICD-10-CM

## 2014-12-01 ENCOUNTER — Ambulatory Visit (INDEPENDENT_AMBULATORY_CARE_PROVIDER_SITE_OTHER)
Admission: RE | Admit: 2014-12-01 | Discharge: 2014-12-01 | Disposition: A | Discharge status: Home / Self Care | Payer: 59 | Source: Ambulatory Visit | Attending: Vascular Surgery | Admitting: Vascular Surgery

## 2014-12-01 ENCOUNTER — Ambulatory Visit (INDEPENDENT_AMBULATORY_CARE_PROVIDER_SITE_OTHER): Payer: 59 | Admitting: Vascular Surgery

## 2014-12-01 ENCOUNTER — Encounter: Payer: Self-pay | Admitting: Vascular Surgery

## 2014-12-01 ENCOUNTER — Ambulatory Visit (HOSPITAL_COMMUNITY)
Admission: RE | Admit: 2014-12-01 | Discharge: 2014-12-01 | Disposition: A | Discharge status: Home / Self Care | Payer: 59 | Source: Ambulatory Visit | Attending: Vascular Surgery | Admitting: Vascular Surgery

## 2014-12-01 VITALS — BP 109/71 | HR 80 | Resp 18 | Ht 73.0 in | Wt 215.0 lb

## 2014-12-01 DIAGNOSIS — I739 Peripheral vascular disease, unspecified: Secondary | ICD-10-CM | POA: Insufficient documentation

## 2014-12-01 DIAGNOSIS — I6523 Occlusion and stenosis of bilateral carotid arteries: Secondary | ICD-10-CM | POA: Diagnosis not present

## 2014-12-01 DIAGNOSIS — IMO0002 Reserved for concepts with insufficient information to code with codable children: Secondary | ICD-10-CM

## 2014-12-01 DIAGNOSIS — I639 Cerebral infarction, unspecified: Secondary | ICD-10-CM

## 2014-12-01 DIAGNOSIS — Z01818 Encounter for other preprocedural examination: Secondary | ICD-10-CM

## 2014-12-01 DIAGNOSIS — Z48812 Encounter for surgical aftercare following surgery on the circulatory system: Secondary | ICD-10-CM | POA: Insufficient documentation

## 2014-12-01 DIAGNOSIS — I6522 Occlusion and stenosis of left carotid artery: Secondary | ICD-10-CM | POA: Insufficient documentation

## 2014-12-01 NOTE — Progress Notes (Signed)
CC:  Follow up carotid duplex scan s/p right CEA 05/09/2014.  Pre-op clearance for left partial knee arthroplasty.  Referring Provider:  [name], MD  HPI: This is a 64 y.o. male who has known carotid stenosis is here for f/u carotid duplex scan.  Denies amaurosis fugax, paresthesias, or hemiparesis.  He reports left 5th digit numbness, otherwise he has made a full recovery from his right CEA.  Bilateral LE symptoms of intermittent claudication symptoms after 100-150 feet of walking.  He reports no history  Ulcers or rest pain in both LE.   Maximum medical management with daily walking, Plavix, Crestor and Pletal.    Past Medical History  Diagnosis Date  . Hyperlipidemia   . Peripheral vascular disease   . Essential hypertension, benign   . 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 Gout   . ED (erectile dysfunction)   . DJD (degenerative joint disease) of knee   . Carotid artery occlusion   . History of stroke     Posterior/inferior right frontal lobe   . Stroke     TIA  2015  . Shingles 05/09/14   Past Surgical History  Procedure Laterality Date  . Knee arthroscopy Right 11/2009  . Meniscus repair  11/2009  . Stents      PLACED IN ??BOTH LEGS   2013?  Marland Kitchen Endarterectomy Right 05/09/2014    Procedure: RIGHT CAROTID ENDARTERECTOMY WITH PATCH ANGIOPLASTY;  Surgeon: Conrad Glenwood, MD;  Location: [facility];  Service: Vascular;  Laterality: Right;  . Abdominal aortagram N/A 03/29/2012    Procedure: ABDOMINAL Maxcine Ham;  Surgeon: Angelia Mould, MD;  Location: [facility] CATH LAB;  Service: Cardiovascular;  Laterality: N/A;  . Percutaneous stent intervention N/A 05/10/2012    Procedure: PERCUTANEOUS STENT INTERVENTION;  Surgeon: Angelia Mould, MD;  Location: [facility] CATH LAB;  Service: Cardiovascular;  Laterality: N/A;    Allergies  Allergen Reactions  . Cialis [Tadalafil] Other (See Comments)   Headache    Current Outpatient Prescriptions  Medication Sig Dispense Refill  . ACCU-CHEK SMARTVIEW test strip     . acetaminophen (TYLENOL) 500 MG tablet Take 1,000 mg by mouth every 6 (six) hours as needed for mild pain.    . cilostazol (PLETAL) 100 MG tablet Take 100 mg by mouth 2 (two) times daily.    . clopidogrel (PLAVIX) 75 MG tablet Take 1 tablet (75 mg total) by mouth daily. 30 tablet 0  . fenofibrate 160 MG tablet Take 160 mg by mouth daily.    Marland Kitchen glimepiride (AMARYL) 1 MG tablet Take 1 mg by mouth daily with breakfast.     . metFORMIN (GLUCOPHAGE) 500 MG tablet Take 1 tablet (500 mg total) by mouth 3 (three) times daily.    . metFORMIN (GLUCOPHAGE-XR) 500 MG 24 hr tablet     . NAPROXEN DR 500 MG EC tablet     . olmesartan (BENICAR) 40 MG tablet Take 20 mg by mouth daily.     . rosuvastatin (CRESTOR) 40 MG tablet Take 20 mg by mouth daily. For cholesterol    . oxyCODONE (ROXICODONE) 5 MG immediate release tablet Take 1 tablet (5 mg total) by mouth every 6 (six) hours as needed for severe pain. (Patient not taking: Reported on 07/14/2014) 20 tablet 0  . valACYclovir (VALTREX) 1000 MG tablet Take 1 tablet (1,000 mg total) by mouth 3 (three) times daily. (Patient not taking:  Reported on 12/01/2014) 21 tablet 0  . zolpidem (AMBIEN) 10 MG tablet Take 1 tablet (10 mg total) by mouth at bedtime as needed for sleep. 30 tablet 0   No current facility-administered medications for this visit.    Family History  Problem Relation Age of Onset  . Diabetes Mother   . Hypertension Mother   . Heart disease Mother     Coronary Artery Bypass Graft  . Hyperlipidemia Mother   . Heart attack Mother   . Hypertension Father   . Hyperlipidemia Sister   . Stroke Sister     History   Social History  . Marital Status: Married    Spouse Name: N/A  . Number of Children: N/A  . Years of Education: N/A   Occupational History  . Not on file.   Social History Main Topics  . Smoking status:  Former Smoker -- 1.00 packs/day for 30 years    Types: Cigarettes    Quit date: 08/04/2009  . Smokeless tobacco: Never Used  . Alcohol Use: 7.2 oz/week    12 Cans of beer per week  . Drug Use: No  . Sexual Activity: No   Other Topics Concern  . Not on file   Social History Narrative     ROS: '[x]'$  Positive   '[ ]'$  Negative   '[ ]'$  All sytems reviewed and are negative  General: '[ ]'$  Weight loss, '[ ]'$  Fever, '[ ]'$  chills Neurologic: '[ ]'$  Dizziness, '[ ]'$  Blackouts, '[ ]'$  Seizure '[ ]'$  Stroke, '[ ]'$  "Mini stroke", '[ ]'$  Slurred speech, '[ ]'$  Temporary blindness; '[ ]'$  weakness in arms or legs, '[ ]'$  Hoarseness Cardiac: '[ ]'$  Chest pain/pressure, '[ ]'$  Shortness of breath at rest '[ ]'$  Shortness of breath with exertion, '[ ]'$  Atrial fibrillation or irregular heartbeat Vascular: '[ ]'$  Pain in legs with walking, '[ ]'$  Pain in legs at rest, '[ ]'$  Pain in legs at night,  '[ ]'$  Non-healing ulcer, '[ ]'$  Blood clot in vein/DVT,   Pulmonary: '[ ]'$  Home oxygen, '[ ]'$  Productive cough, '[ ]'$  Coughing up blood, '[ ]'$  Asthma,  '[ ]'$  Wheezing Musculoskeletal:  [x ] Arthritis, '[ ]'$  Low back pain, '[ ]'$  Joint pain Hematologic: '[ ]'$  Easy Bruising, '[ ]'$  Anemia; '[ ]'$  Hepatitis Gastrointestinal: '[ ]'$  Blood in stool, '[ ]'$  Gastroesophageal Reflux/heartburn, '[ ]'$  Trouble swallowing Urinary: '[ ]'$  chronic Kidney disease, '[ ]'$  on HD - '[ ]'$  MWF or '[ ]'$  TTHS, '[ ]'$  Burning with urination, '[ ]'$  Difficulty urinating Endocrine: '[ ]'$  hx of diabetes, '[ ]'$  hx of thyroid disease Skin: '[ ]'$  Rashes, '[ ]'$  Wounds Psychological: '[ ]'$  Anxiety, '[ ]'$  Depression   PHYSICAL EXAMINATION:  Filed Vitals:   12/01/14 1238  BP: 109/71  Pulse: 80  Resp:    Body mass index is 28.37 kg/(m^2).  General:  WDWN in NAD Gait: Normal HENT: WNL; normocephalic Eyes: PERRL Pulmonary: normal non-labored breathing , without Rales, rhonchi,  wheezing Cardiac: RRR, without  Murmurs, rubs or gallops; without carotid bruits Abdomen: soft, NT, no masses Skin: without rashes,  ulcers  Vascular Exam/Pulses:  Palpable bil. Radial, Femoral, left DP.  Non palpable right DP/PT Extremities: without ischemic changes, without Gangrene , without cellulitis; without open wounds;  Musculoskeletal: without muscle wasting or atrophy  Neurologic: A&O X 3; Appropriate Affect ; SENSATION: normal; MOTOR FUNCTION:  moving all extremities equally. Speech is fluent/normal   Non-Invasive Vascular Imaging: Carotid Duplex Scan:  12/01/2014  Right patent s/p CEA 08/09/2013  Left 50-69% stnosis  ABI's R Mon 0.61 L biphasic DP 0.69  ASSESSMENT/PLAN: 64 y.o. male here for f/u carotid duplex scan.  S/P right CEA with patent ICA. Left carotid stenosis 50-69% will repeat carotid duplex in 6 months.  He is asymptomatic. He has a history of intermittently claudication without ulcers or rest pain.  He has palpable DP pulse on the left.  From a vascular stand point he is stable and may proceed with his Left partial knee arthroplasty. F/U ABI's in 6 months   - Theda Sers [facility] [phone]  Clinic MD:   Pt seen and examined in conjunction with Dr. Bridgett Larsson  Addendum  I have independently interviewed and examined the patient, and I agree with the physician assistant's findings.  Repeat B carotid duplex in 6 months.  VQI follow at that appt.  Repeat ABI at the appt.  Will address intermittent claudication sx after pt recovers from his surgery.  Adele Barthel, MD [facility] [place] Office: [phone] Pager: [phone]  12/01/2014, 6:31 PM

## 2014-12-11 ENCOUNTER — Other Ambulatory Visit: Payer: Self-pay | Admitting: Orthopedic Surgery

## 2014-12-14 ENCOUNTER — Encounter (HOSPITAL_COMMUNITY): Payer: Self-pay

## 2014-12-14 ENCOUNTER — Encounter (HOSPITAL_COMMUNITY)
Admission: RE | Admit: 2014-12-14 | Discharge: 2014-12-14 | Disposition: A | Discharge status: Home / Self Care | Payer: 59 | Source: Ambulatory Visit | Attending: Orthopedic Surgery | Admitting: Orthopedic Surgery

## 2014-12-14 HISTORY — DX: Personal history of colon polyps, unspecified: Z86.0100

## 2014-12-14 HISTORY — DX: Pain in unspecified joint: M25.50

## 2014-12-14 HISTORY — DX: Insomnia, unspecified: G47.00

## 2014-12-14 HISTORY — DX: Personal history of other diseases of the musculoskeletal system and connective tissue: Z87.39

## 2014-12-14 HISTORY — DX: Personal history of colonic polyps: Z86.010

## 2014-12-14 HISTORY — DX: Nocturia: R35.1

## 2014-12-14 LAB — CBC
HCT: 35 % — ABNORMAL LOW (ref 39.0–52.0)
HEMOGLOBIN: 11.8 g/dL — AB (ref 13.0–17.0)
MCH: 31.4 pg (ref 26.0–34.0)
MCHC: 33.7 g/dL (ref 30.0–36.0)
MCV: 93.1 fL (ref 78.0–100.0)
Platelets: 174 10*3/uL (ref 150–400)
RBC: 3.76 MIL/uL — AB (ref 4.22–5.81)
RDW: 13 % (ref 11.5–15.5)
WBC: 4.3 10*3/uL (ref 4.0–10.5)

## 2014-12-14 LAB — BASIC METABOLIC PANEL
Anion gap: 9 (ref 5–15)
BUN: 20 mg/dL (ref 6–20)
CALCIUM: 9.9 mg/dL (ref 8.9–10.3)
CO2: 23 mmol/L (ref 22–32)
Chloride: 108 mmol/L (ref 101–111)
Creatinine, Ser: 1.14 mg/dL (ref 0.61–1.24)
GFR calc Af Amer: >60 mL/min (ref >60)
Glucose, Bld: 155 mg/dL — ABNORMAL HIGH (ref 65–99)
POTASSIUM: 3.7 mmol/L (ref 3.5–5.1)
SODIUM: 140 mmol/L (ref 135–145)

## 2014-12-14 LAB — SURGICAL PCR SCREEN
MRSA, PCR: NEGATIVE
Staphylococcus aureus: POSITIVE — AB

## 2014-12-14 NOTE — Pre-Procedure Instructions (Signed)
RIDGE LAFOND  12/14/2014   Your procedure is scheduled on: Tues, May 24 @ 7:30 AM  Report to [facility]  at 5:30 AM.  Call this number if you have problems the morning of surgery: [phone]   Remember:   Do not eat food or drink liquids after midnight.   Take these medicines the morning of surgery with A SIP OF WATER: Pain Pill(if needed)              Stop taking your Pletal and Plavix as instructed along with your Naproxen. No Goody's,BC's,Aspirin,Ibuprofen,Fish Oil,or any Herbal Medications.    Do not wear jewelry, make-up or nail polish.  Do not wear lotions, powders, or perfumes. You may wear deodorant.  Do not shave 48 hours prior to surgery.   Do not bring valuables to the hospital.  [facility] is not responsible                  for any belongings or valuables.               Contacts, dentures or bridgework may not be worn into surgery.  Leave suitcase in the car. After surgery it may be brought to your room.  For patients admitted to the hospital, discharge time is determined by your                treatment team.               Special Instructions:   - Preparing for Surgery  Before surgery, you can play an important role.  Because skin is not sterile, your skin needs to be as free of germs as possible.  You can reduce the number of germs on you skin by washing with CHG (chlorahexidine gluconate) soap before surgery.  CHG is an antiseptic cleaner which kills germs and bonds with the skin to continue killing germs even after washing.  Please DO NOT use if you have an allergy to CHG or antibacterial soaps.  If your skin becomes reddened/irritated stop using the CHG and inform your nurse when you arrive at Short Stay.  Do not shave (including legs and underarms) for at least 48 hours prior to the first CHG shower.  You may shave your face.  Please follow these instructions carefully:   1.  Shower with CHG Soap the night before surgery and the                                 morning of Surgery.  2.  If you choose to wash your hair, wash your hair first as usual with your       normal shampoo.  3.  After you shampoo, rinse your hair and body thoroughly to remove the                      Shampoo.  4.  Use CHG as you would any other liquid soap.  You can apply chg directly       to the skin and wash gently with scrungie or a clean washcloth.  5.  Apply the CHG Soap to your body ONLY FROM THE NECK DOWN.        Do not use on open wounds or open sores.  Avoid contact with your eyes,       ears, mouth and genitals (private parts).  Wash genitals (private parts)  with your normal soap.  6.  Wash thoroughly, paying special attention to the area where your surgery        will be performed.  7.  Thoroughly rinse your body with warm water from the neck down.  8.  DO NOT shower/wash with your normal soap after using and rinsing off       the CHG Soap.  9.  Pat yourself dry with a clean towel.            10.  Wear clean pajamas.            11.  Place clean sheets on your bed the night of your first shower and do not        sleep with pets.  Day of Surgery  Do not apply any lotions/deoderants the morning of surgery.  Please wear clean clothes to the hospital/surgery center.     Please read over the following fact sheets that you were given: Pain Booklet, Coughing and Deep Breathing, MRSA Information and Surgical Site Infection Prevention

## 2014-12-14 NOTE — Progress Notes (Addendum)
Echo and stress test report in epic from 2015  Heart cath denies  EKG and CXR in epic from 05-08-14  Medical Md is Dr.Robert Technical brewer  Cardiologist is Dr.Samuel Domenic Polite

## 2014-12-14 NOTE — Progress Notes (Signed)
Left a message for Sherry,scheduler with Dr.Landau and notified her that pt had not been told when to hold his Pletal and Plavix.I asked her to call him and let him know what Dr.Landau wants.

## 2014-12-14 NOTE — Progress Notes (Signed)
   12/14/14 1107  OBSTRUCTIVE SLEEP APNEA  Have you ever been diagnosed with sleep apnea through a sleep study? No  Do you snore loudly (loud enough to be heard through closed doors)?  0  Do you often feel tired, fatigued, or sleepy during the daytime? 0  Has anyone observed you stop breathing during your sleep? 0  Do you have, or are you being treated for high blood pressure? 1  BMI more than 35 kg/m2? 0  Age over 62 years old? 1  Neck circumference greater than 40 cm/16 inches? 1 (17)  Gender: 1

## 2014-12-14 NOTE — Progress Notes (Signed)
Mupirocin script called into the [facility] on [address] in [place]

## 2014-12-18 ENCOUNTER — Encounter: Payer: Self-pay | Admitting: *Deleted

## 2014-12-22 ENCOUNTER — Ambulatory Visit: Payer: 59 | Admitting: Vascular Surgery

## 2014-12-22 ENCOUNTER — Encounter (HOSPITAL_COMMUNITY): Payer: 59

## 2014-12-22 ENCOUNTER — Other Ambulatory Visit (HOSPITAL_COMMUNITY): Payer: 59

## 2014-12-26 ENCOUNTER — Inpatient Hospital Stay (HOSPITAL_COMMUNITY): Payer: 59

## 2014-12-26 ENCOUNTER — Encounter (HOSPITAL_COMMUNITY): Payer: Self-pay | Admitting: Anesthesiology

## 2014-12-26 ENCOUNTER — Inpatient Hospital Stay (HOSPITAL_COMMUNITY)
Admission: RE | Admit: 2014-12-26 | Discharge: 2014-12-27 | DRG: 470 | Disposition: A | Discharge status: Home Health | Payer: 59 | Source: Ambulatory Visit | Attending: Orthopedic Surgery | Admitting: Orthopedic Surgery

## 2014-12-26 ENCOUNTER — Encounter (HOSPITAL_COMMUNITY)
Admission: RE | Disposition: A | Discharge status: Home Health | Payer: Self-pay | Source: Ambulatory Visit | Attending: Orthopedic Surgery

## 2014-12-26 ENCOUNTER — Inpatient Hospital Stay (HOSPITAL_COMMUNITY): Payer: 59 | Admitting: Anesthesiology

## 2014-12-26 DIAGNOSIS — Z823 Family history of stroke: Secondary | ICD-10-CM | POA: Diagnosis not present

## 2014-12-26 DIAGNOSIS — M1710 Unilateral primary osteoarthritis, unspecified knee: Secondary | ICD-10-CM | POA: Diagnosis present

## 2014-12-26 DIAGNOSIS — G4700 Insomnia, unspecified: Secondary | ICD-10-CM | POA: Diagnosis present

## 2014-12-26 DIAGNOSIS — I70213 Atherosclerosis of native arteries of extremities with intermittent claudication, bilateral legs: Secondary | ICD-10-CM | POA: Diagnosis present

## 2014-12-26 DIAGNOSIS — E785 Hyperlipidemia, unspecified: Secondary | ICD-10-CM | POA: Diagnosis present

## 2014-12-26 DIAGNOSIS — Z7902 Long term (current) use of antithrombotics/antiplatelets: Secondary | ICD-10-CM | POA: Diagnosis not present

## 2014-12-26 DIAGNOSIS — I10 Essential (primary) hypertension: Secondary | ICD-10-CM | POA: Diagnosis present

## 2014-12-26 DIAGNOSIS — M1712 Unilateral primary osteoarthritis, left knee: Secondary | ICD-10-CM | POA: Diagnosis present

## 2014-12-26 DIAGNOSIS — M25562 Pain in left knee: Secondary | ICD-10-CM | POA: Diagnosis present

## 2014-12-26 DIAGNOSIS — Z8673 Personal history of transient ischemic attack (TIA), and cerebral infarction without residual deficits: Secondary | ICD-10-CM | POA: Diagnosis not present

## 2014-12-26 DIAGNOSIS — Z8249 Family history of ischemic heart disease and other diseases of the circulatory system: Secondary | ICD-10-CM | POA: Diagnosis not present

## 2014-12-26 DIAGNOSIS — E1151 Type 2 diabetes mellitus with diabetic peripheral angiopathy without gangrene: Secondary | ICD-10-CM | POA: Diagnosis present

## 2014-12-26 DIAGNOSIS — Z833 Family history of diabetes mellitus: Secondary | ICD-10-CM

## 2014-12-26 DIAGNOSIS — Z79899 Other long term (current) drug therapy: Secondary | ICD-10-CM | POA: Diagnosis not present

## 2014-12-26 DIAGNOSIS — Z96652 Presence of left artificial knee joint: Secondary | ICD-10-CM

## 2014-12-26 DIAGNOSIS — Z87891 Personal history of nicotine dependence: Secondary | ICD-10-CM | POA: Diagnosis not present

## 2014-12-26 DIAGNOSIS — M179 Osteoarthritis of knee, unspecified: Secondary | ICD-10-CM | POA: Diagnosis present

## 2014-12-26 HISTORY — PX: PARTIAL KNEE ARTHROPLASTY: SHX2174

## 2014-12-26 HISTORY — DX: Unilateral primary osteoarthritis, left knee: M17.12

## 2014-12-26 LAB — GLUCOSE, CAPILLARY
GLUCOSE-CAPILLARY: 152 mg/dL — AB (ref 65–99)
GLUCOSE-CAPILLARY: 211 mg/dL — AB (ref 65–99)
Glucose-Capillary: 139 mg/dL — ABNORMAL HIGH (ref 65–99)
Glucose-Capillary: 246 mg/dL — ABNORMAL HIGH (ref 65–99)

## 2014-12-26 SURGERY — ARTHROPLASTY, KNEE, UNICOMPARTMENTAL
Anesthesia: Spinal | Site: Knee | Laterality: Left

## 2014-12-26 MED ORDER — GLIMEPIRIDE 1 MG PO TABS
1.0000 mg | ORAL_TABLET | Freq: Every day | ORAL | Status: DC
  Administered 2014-12-27: 1 mg via ORAL
  Filled 2014-12-26 (×2): qty 1

## 2014-12-26 MED ORDER — CEFAZOLIN SODIUM-DEXTROSE 2-3 GM-% IV SOLR
2.0000 g | Freq: Four times a day (QID) | INTRAVENOUS | Status: AC
  Administered 2014-12-26 (×2): 2 g via INTRAVENOUS
  Filled 2014-12-26 (×2): qty 50

## 2014-12-26 MED ORDER — DEXAMETHASONE SODIUM PHOSPHATE 4 MG/ML IJ SOLN
INTRAMUSCULAR | Status: DC | PRN
  Administered 2014-12-26: 6 mg via INTRAVENOUS

## 2014-12-26 MED ORDER — BUPIVACAINE-EPINEPHRINE (PF) 0.5% -1:200000 IJ SOLN
INTRAMUSCULAR | Status: DC | PRN
  Administered 2014-12-26: 30 mL via PERINEURAL

## 2014-12-26 MED ORDER — STERILE WATER FOR INJECTION IJ SOLN
INTRAMUSCULAR | Status: AC
  Filled 2014-12-26: qty 10

## 2014-12-26 MED ORDER — MIDAZOLAM HCL 5 MG/5ML IJ SOLN
INTRAMUSCULAR | Status: DC | PRN
  Administered 2014-12-26: 2 mg via INTRAVENOUS

## 2014-12-26 MED ORDER — LACTATED RINGERS IV SOLN
INTRAVENOUS | Status: DC | PRN
  Administered 2014-12-26 (×2): via INTRAVENOUS

## 2014-12-26 MED ORDER — 0.9 % SODIUM CHLORIDE (POUR BTL) OPTIME
TOPICAL | Status: DC | PRN
  Administered 2014-12-26: 1000 mL

## 2014-12-26 MED ORDER — PROPOFOL 10 MG/ML IV BOLUS
INTRAVENOUS | Status: AC
  Filled 2014-12-26: qty 20

## 2014-12-26 MED ORDER — DIPHENHYDRAMINE HCL 12.5 MG/5ML PO ELIX: 12.5000 mg | ORAL_SOLUTION | ORAL | Status: DC | PRN

## 2014-12-26 MED ORDER — ROCURONIUM BROMIDE 50 MG/5ML IV SOLN
INTRAVENOUS | Status: AC
  Filled 2014-12-26: qty 1

## 2014-12-26 MED ORDER — DOCUSATE SODIUM 100 MG PO CAPS
100.0000 mg | ORAL_CAPSULE | Freq: Two times a day (BID) | ORAL | Status: DC
  Administered 2014-12-26 – 2014-12-27 (×3): 100 mg via ORAL
  Filled 2014-12-26 (×3): qty 1

## 2014-12-26 MED ORDER — ONDANSETRON HCL 4 MG/2ML IJ SOLN: 4.0000 mg | Freq: Four times a day (QID) | INTRAMUSCULAR | Status: DC | PRN

## 2014-12-26 MED ORDER — PHENOL 1.4 % MT LIQD: 1.0000 | OROMUCOSAL | Status: DC | PRN

## 2014-12-26 MED ORDER — IRBESARTAN 300 MG PO TABS
300.0000 mg | ORAL_TABLET | Freq: Every day | ORAL | Status: DC
Start: 2014-12-26 — End: 2014-12-27
  Administered 2014-12-26 – 2014-12-27 (×2): 300 mg via ORAL
  Filled 2014-12-26 (×2): qty 1

## 2014-12-26 MED ORDER — METOCLOPRAMIDE HCL 5 MG/ML IJ SOLN: 5.0000 mg | Freq: Three times a day (TID) | INTRAMUSCULAR | Status: DC | PRN

## 2014-12-26 MED ORDER — ONDANSETRON HCL 4 MG/2ML IJ SOLN
INTRAMUSCULAR | Status: AC
  Filled 2014-12-26: qty 2

## 2014-12-26 MED ORDER — VECURONIUM BROMIDE 10 MG IV SOLR
INTRAVENOUS | Status: AC
  Filled 2014-12-26: qty 10

## 2014-12-26 MED ORDER — BACLOFEN 10 MG PO TABS: 10.0000 mg | ORAL_TABLET | Freq: Three times a day (TID) | ORAL | Status: DC

## 2014-12-26 MED ORDER — BISACODYL 10 MG RE SUPP: 10.0000 mg | Freq: Every day | RECTAL | Status: DC | PRN

## 2014-12-26 MED ORDER — PROPOFOL 10 MG/ML IV BOLUS
INTRAVENOUS | Status: DC | PRN
  Administered 2014-12-26: 180 mg via INTRAVENOUS
  Administered 2014-12-26: 40 mg via INTRAVENOUS

## 2014-12-26 MED ORDER — ZOLPIDEM TARTRATE 5 MG PO TABS: 10.0000 mg | ORAL_TABLET | Freq: Every evening | ORAL | Status: DC | PRN

## 2014-12-26 MED ORDER — LIDOCAINE HCL (CARDIAC) 20 MG/ML IV SOLN
INTRAVENOUS | Status: DC | PRN
  Administered 2014-12-26: 40 mg via INTRAVENOUS
  Administered 2014-12-26: 60 mg via INTRAVENOUS

## 2014-12-26 MED ORDER — ARTIFICIAL TEARS OP OINT
TOPICAL_OINTMENT | OPHTHALMIC | Status: AC
  Filled 2014-12-26: qty 3.5

## 2014-12-26 MED ORDER — ONDANSETRON HCL 4 MG/2ML IJ SOLN
INTRAMUSCULAR | Status: DC | PRN
  Administered 2014-12-26: 4 mg via INTRAVENOUS

## 2014-12-26 MED ORDER — HYDROMORPHONE HCL 1 MG/ML IJ SOLN: 1.0000 mg | INTRAMUSCULAR | Status: DC | PRN

## 2014-12-26 MED ORDER — MAGNESIUM CITRATE PO SOLN: 1.0000 | Freq: Once | ORAL | Status: AC | PRN

## 2014-12-26 MED ORDER — OXYCODONE HCL 5 MG/5ML PO SOLN: 5.0000 mg | Freq: Once | ORAL | Status: DC | PRN

## 2014-12-26 MED ORDER — HYDROMORPHONE HCL 1 MG/ML IJ SOLN
0.2500 mg | INTRAMUSCULAR | Status: DC | PRN
  Administered 2014-12-26: 0.5 mg via INTRAVENOUS

## 2014-12-26 MED ORDER — HYDROMORPHONE HCL 1 MG/ML IJ SOLN
INTRAMUSCULAR | Status: AC
Start: 2014-12-26 — End: 2014-12-26
  Filled 2014-12-26: qty 1

## 2014-12-26 MED ORDER — MIDAZOLAM HCL 2 MG/2ML IJ SOLN
INTRAMUSCULAR | Status: AC
  Filled 2014-12-26: qty 2

## 2014-12-26 MED ORDER — SUCCINYLCHOLINE CHLORIDE 20 MG/ML IJ SOLN
INTRAMUSCULAR | Status: AC
  Filled 2014-12-26: qty 1

## 2014-12-26 MED ORDER — POTASSIUM CHLORIDE IN NACL 20-0.45 MEQ/L-% IV SOLN
INTRAVENOUS | Status: DC
  Administered 2014-12-26 – 2014-12-27 (×2): via INTRAVENOUS
  Filled 2014-12-26 (×4): qty 1000

## 2014-12-26 MED ORDER — METFORMIN HCL 500 MG PO TABS
500.0000 mg | ORAL_TABLET | Freq: Three times a day (TID) | ORAL | Status: DC
Start: 2014-12-26 — End: 2014-12-27
  Administered 2014-12-26 – 2014-12-27 (×3): 500 mg via ORAL
  Filled 2014-12-26 (×3): qty 1

## 2014-12-26 MED ORDER — CEFAZOLIN SODIUM-DEXTROSE 2-3 GM-% IV SOLR
2.0000 g | INTRAVENOUS | Status: AC
  Administered 2014-12-26: 2 g via INTRAVENOUS

## 2014-12-26 MED ORDER — SODIUM CHLORIDE 0.9 % IV SOLN
10.0000 mg | INTRAVENOUS | Status: DC | PRN
  Administered 2014-12-26: 25 ug/min via INTRAVENOUS

## 2014-12-26 MED ORDER — FENOFIBRATE 160 MG PO TABS
160.0000 mg | ORAL_TABLET | Freq: Every day | ORAL | Status: DC
  Administered 2014-12-26 – 2014-12-27 (×2): 160 mg via ORAL
  Filled 2014-12-26 (×2): qty 1

## 2014-12-26 MED ORDER — FENTANYL CITRATE (PF) 100 MCG/2ML IJ SOLN
INTRAMUSCULAR | Status: DC | PRN
Start: 2014-12-26 — End: 2014-12-26
  Administered 2014-12-26: 50 ug via INTRAVENOUS
  Administered 2014-12-26 (×2): 100 ug via INTRAVENOUS
  Administered 2014-12-26: 50 ug via INTRAVENOUS

## 2014-12-26 MED ORDER — BUPIVACAINE HCL (PF) 0.25 % IJ SOLN
INTRAMUSCULAR | Status: AC
  Filled 2014-12-26: qty 30

## 2014-12-26 MED ORDER — OXYCODONE-ACETAMINOPHEN 10-325 MG PO TABS: 1.0000 | ORAL_TABLET | Freq: Four times a day (QID) | ORAL | Status: DC | PRN

## 2014-12-26 MED ORDER — ROSUVASTATIN CALCIUM 10 MG PO TABS
20.0000 mg | ORAL_TABLET | Freq: Every day | ORAL | Status: DC
  Administered 2014-12-27: 20 mg via ORAL
  Filled 2014-12-26 (×2): qty 2

## 2014-12-26 MED ORDER — RIVAROXABAN 10 MG PO TABS
10.0000 mg | ORAL_TABLET | Freq: Every day | ORAL | Status: DC
  Administered 2014-12-27: 10 mg via ORAL
  Filled 2014-12-26: qty 1

## 2014-12-26 MED ORDER — ACETAMINOPHEN 325 MG PO TABS: 650.0000 mg | ORAL_TABLET | Freq: Four times a day (QID) | ORAL | Status: DC | PRN

## 2014-12-26 MED ORDER — FENTANYL CITRATE (PF) 250 MCG/5ML IJ SOLN
INTRAMUSCULAR | Status: AC
  Filled 2014-12-26: qty 5

## 2014-12-26 MED ORDER — ACETAMINOPHEN 650 MG RE SUPP: 650.0000 mg | Freq: Four times a day (QID) | RECTAL | Status: DC | PRN

## 2014-12-26 MED ORDER — KETOROLAC TROMETHAMINE 15 MG/ML IJ SOLN
7.5000 mg | Freq: Four times a day (QID) | INTRAMUSCULAR | Status: AC
  Administered 2014-12-26 – 2014-12-27 (×3): 7.5 mg via INTRAVENOUS
  Filled 2014-12-26 (×3): qty 1

## 2014-12-26 MED ORDER — METHOCARBAMOL 1000 MG/10ML IJ SOLN
500.0000 mg | Freq: Four times a day (QID) | INTRAVENOUS | Status: DC | PRN
  Filled 2014-12-26: qty 5

## 2014-12-26 MED ORDER — DEXAMETHASONE SODIUM PHOSPHATE 4 MG/ML IJ SOLN
INTRAMUSCULAR | Status: AC
  Filled 2014-12-26: qty 2

## 2014-12-26 MED ORDER — SENNA-DOCUSATE SODIUM 8.6-50 MG PO TABS: 2.0000 | ORAL_TABLET | Freq: Every day | ORAL | Status: DC

## 2014-12-26 MED ORDER — CILOSTAZOL 100 MG PO TABS
100.0000 mg | ORAL_TABLET | Freq: Two times a day (BID) | ORAL | Status: DC
  Administered 2014-12-26 – 2014-12-27 (×3): 100 mg via ORAL
  Filled 2014-12-26 (×4): qty 1

## 2014-12-26 MED ORDER — OXYCODONE HCL 5 MG PO TABS: 5.0000 mg | ORAL_TABLET | Freq: Once | ORAL | Status: DC | PRN

## 2014-12-26 MED ORDER — RIVAROXABAN 10 MG PO TABS: 10.0000 mg | ORAL_TABLET | Freq: Every day | ORAL | Status: DC

## 2014-12-26 MED ORDER — OXYCODONE HCL 5 MG PO TABS
5.0000 mg | ORAL_TABLET | ORAL | Status: DC | PRN
  Administered 2014-12-26 – 2014-12-27 (×2): 10 mg via ORAL
  Filled 2014-12-26 (×2): qty 2

## 2014-12-26 MED ORDER — SODIUM CHLORIDE 0.9 % IR SOLN
Status: DC | PRN
  Administered 2014-12-26: 1000 mL

## 2014-12-26 MED ORDER — POLYETHYLENE GLYCOL 3350 17 G PO PACK: 17.0000 g | PACK | Freq: Every day | ORAL | Status: DC | PRN

## 2014-12-26 MED ORDER — ALUM & MAG HYDROXIDE-SIMETH 200-200-20 MG/5ML PO SUSP: 30.0000 mL | ORAL | Status: DC | PRN

## 2014-12-26 MED ORDER — ONDANSETRON HCL 4 MG PO TABS: 4.0000 mg | ORAL_TABLET | Freq: Four times a day (QID) | ORAL | Status: DC | PRN

## 2014-12-26 MED ORDER — METOCLOPRAMIDE HCL 5 MG PO TABS: 5.0000 mg | ORAL_TABLET | Freq: Three times a day (TID) | ORAL | Status: DC | PRN

## 2014-12-26 MED ORDER — METHOCARBAMOL 500 MG PO TABS
500.0000 mg | ORAL_TABLET | Freq: Four times a day (QID) | ORAL | Status: DC | PRN
  Administered 2014-12-26 – 2014-12-27 (×2): 500 mg via ORAL
  Filled 2014-12-26 (×2): qty 1

## 2014-12-26 MED ORDER — INSULIN ASPART 100 UNIT/ML ~~LOC~~ SOLN
0.0000 [IU] | Freq: Three times a day (TID) | SUBCUTANEOUS | Status: DC
  Administered 2014-12-26: 5 [IU] via SUBCUTANEOUS
  Administered 2014-12-27: 3 [IU] via SUBCUTANEOUS
  Administered 2014-12-27: 2 [IU] via SUBCUTANEOUS

## 2014-12-26 MED ORDER — SENNA 8.6 MG PO TABS
1.0000 | ORAL_TABLET | Freq: Two times a day (BID) | ORAL | Status: DC
  Administered 2014-12-26 – 2014-12-27 (×3): 8.6 mg via ORAL
  Filled 2014-12-26 (×3): qty 1

## 2014-12-26 MED ORDER — MENTHOL 3 MG MT LOZG: 1.0000 | LOZENGE | OROMUCOSAL | Status: DC | PRN

## 2014-12-26 SURGICAL SUPPLY — 66 items
APL SKNCLS STERI-STRIP NONHPOA (GAUZE/BANDAGES/DRESSINGS) ×1
BANDAGE ELASTIC 4 VELCRO ST LF (GAUZE/BANDAGES/DRESSINGS) ×2 IMPLANT
BANDAGE ELASTIC 6 VELCRO ST LF (GAUZE/BANDAGES/DRESSINGS) ×3 IMPLANT
BANDAGE ESMARK 6X9 LF (GAUZE/BANDAGES/DRESSINGS) ×1 IMPLANT
BENZOIN TINCTURE PRP APPL 2/3 (GAUZE/BANDAGES/DRESSINGS) ×3 IMPLANT
BNDG CMPR 9X6 STRL LF SNTH (GAUZE/BANDAGES/DRESSINGS) ×1
BNDG ESMARK 6X9 LF (GAUZE/BANDAGES/DRESSINGS) ×3
BOWL SMART MIX CTS (DISPOSABLE) ×3 IMPLANT
CAPT KNEE PARTIAL 2 ×2 IMPLANT
CEMENT HV SMART SET (Cement) ×3 IMPLANT
CLOSURE STERI-STRIP 1/2X4 (GAUZE/BANDAGES/DRESSINGS)
CLSR STERI-STRIP ANTIMIC 1/2X4 (GAUZE/BANDAGES/DRESSINGS) ×1 IMPLANT
COVER SURGICAL LIGHT HANDLE (MISCELLANEOUS) ×3 IMPLANT
CUFF TOURNIQUET SINGLE 34IN LL (TOURNIQUET CUFF) ×3 IMPLANT
DRAPE EXTREMITY T 121X128X90 (DRAPE) ×3 IMPLANT
DRAPE IMP U-DRAPE 54X76 (DRAPES) ×3 IMPLANT
DRAPE ORTHO SPLIT 77X108 STRL (DRAPES)
DRAPE PROXIMA HALF (DRAPES) IMPLANT
DRAPE SURG ORHT 6 SPLT 77X108 (DRAPES) IMPLANT
DRAPE U-SHAPE 47X51 STRL (DRAPES) ×3 IMPLANT
DURAPREP 26ML APPLICATOR (WOUND CARE) ×3 IMPLANT
ELECT CAUTERY BLADE 6.4 (BLADE) ×3 IMPLANT
ELECT REM PT RETURN 9FT ADLT (ELECTROSURGICAL) ×3
ELECTRODE REM PT RTRN 9FT ADLT (ELECTROSURGICAL) ×1 IMPLANT
GAUZE SPONGE 4X4 12PLY STRL (GAUZE/BANDAGES/DRESSINGS) ×3 IMPLANT
GLOVE BIOGEL PI IND STRL 7.5 (GLOVE) ×1 IMPLANT
GLOVE BIOGEL PI INDICATOR 7.5 (GLOVE) ×2
GLOVE BIOGEL PI ORTHO PRO SZ8 (GLOVE) ×4
GLOVE ORTHO TXT STRL SZ7.5 (GLOVE) ×3 IMPLANT
GLOVE PI ORTHO PRO STRL SZ8 (GLOVE) ×2 IMPLANT
GLOVE SURG ORTHO 8.0 STRL STRW (GLOVE) ×6 IMPLANT
GOWN STRL REUS W/ TWL LRG LVL3 (GOWN DISPOSABLE) ×1 IMPLANT
GOWN STRL REUS W/ TWL XL LVL3 (GOWN DISPOSABLE) ×1 IMPLANT
GOWN STRL REUS W/TWL 2XL LVL3 (GOWN DISPOSABLE) ×3 IMPLANT
GOWN STRL REUS W/TWL LRG LVL3 (GOWN DISPOSABLE) ×3
GOWN STRL REUS W/TWL XL LVL3 (GOWN DISPOSABLE) ×3
HANDPIECE INTERPULSE COAX TIP (DISPOSABLE) ×3
HOOD PEEL AWAY FACE SHEILD DIS (HOOD) ×6 IMPLANT
IMMOBILIZER KNEE 22 (SOFTGOODS) ×2 IMPLANT
IMMOBILIZER KNEE 22 UNIV (SOFTGOODS) ×1 IMPLANT
KIT BASIN OR (CUSTOM PROCEDURE TRAY) ×3 IMPLANT
KIT ROOM TURNOVER OR (KITS) ×3 IMPLANT
MANIFOLD NEPTUNE II (INSTRUMENTS) ×3 IMPLANT
NDL HYPO 21X1.5 SAFETY (NEEDLE) IMPLANT
NEEDLE HYPO 21X1.5 SAFETY (NEEDLE) IMPLANT
NS IRRIG 1000ML POUR BTL (IV SOLUTION) ×3 IMPLANT
PACK BLADE SAW RECIP 70 3 PT (BLADE) ×2 IMPLANT
PACK TOTAL JOINT (CUSTOM PROCEDURE TRAY) ×3 IMPLANT
PACK UNIVERSAL I (CUSTOM PROCEDURE TRAY) ×3 IMPLANT
PAD ABD 8X10 STRL (GAUZE/BANDAGES/DRESSINGS) ×3 IMPLANT
PAD ARMBOARD 7.5X6 YLW CONV (MISCELLANEOUS) ×6 IMPLANT
PAD CAST 4YDX4 CTTN HI CHSV (CAST SUPPLIES) ×1 IMPLANT
PADDING CAST COTTON 4X4 STRL (CAST SUPPLIES) ×3
PADDING CAST COTTON 6X4 STRL (CAST SUPPLIES) ×3 IMPLANT
SET HNDPC FAN SPRY TIP SCT (DISPOSABLE) ×1 IMPLANT
SUCTION FRAZIER TIP 10 FR DISP (SUCTIONS) ×1 IMPLANT
SUT MNCRL AB 4-0 PS2 18 (SUTURE) IMPLANT
SUT VIC AB 0 CT1 27 (SUTURE) ×3
SUT VIC AB 0 CT1 27XBRD ANBCTR (SUTURE) ×1 IMPLANT
SUT VIC AB 1 CT1 27 (SUTURE) ×3
SUT VIC AB 1 CT1 27XBRD ANBCTR (SUTURE) ×1 IMPLANT
SUT VIC AB 3-0 SH 8-18 (SUTURE) ×1 IMPLANT
SYR CONTROL 10ML LL (SYRINGE) IMPLANT
TOWEL OR 17X24 6PK STRL BLUE (TOWEL DISPOSABLE) ×3 IMPLANT
TOWEL OR 17X26 10 PK STRL BLUE (TOWEL DISPOSABLE) ×3 IMPLANT
WATER STERILE IRR 1000ML POUR (IV SOLUTION) ×1 IMPLANT

## 2014-12-26 NOTE — Anesthesia Preprocedure Evaluation (Signed)
Anesthesia Evaluation  Patient identified by MRN, date of birth, ID band Patient awake    Reviewed: Allergy & Precautions, NPO status , Patient's Chart, lab work & pertinent test results  Airway Mallampati: II   Neck ROM: full    Dental   Pulmonary former smoker,  breath sounds clear to auscultation        Cardiovascular hypertension, + Peripheral Vascular Disease Rhythm:regular Rate:Normal     Neuro/Psych    GI/Hepatic GERD-  ,  Endo/Other  diabetes, Type 2  Renal/GU      Musculoskeletal  (+) Arthritis -,   Abdominal   Peds  Hematology   Anesthesia Other Findings   Reproductive/Obstetrics                             Anesthesia Physical Anesthesia Plan  ASA: II  Anesthesia Plan: Spinal   Post-op Pain Management:    Induction: Intravenous  Airway Management Planned: Simple Face Mask  Additional Equipment:   Intra-op Plan:   Post-operative Plan:   Informed Consent: I have reviewed the patients History and Physical, chart, labs and discussed the procedure including the risks, benefits and alternatives for the proposed anesthesia with the patient or authorized representative who has indicated his/her understanding and acceptance.     Plan Discussed with: CRNA, Anesthesiologist and Surgeon  Anesthesia Plan Comments:         Anesthesia Quick Evaluation

## 2014-12-26 NOTE — Op Note (Signed)
12/26/2014  9:09 AM  PATIENT:  Jesse Avila    PRE-OPERATIVE DIAGNOSIS:  left knee primary localized medial osteoarthritis  POST-OPERATIVE DIAGNOSIS:  Same  PROCEDURE:  UNICOMPARTMENTAL LEFT KNEE REPLACEMENT  SURGEON:  Johnny Bridge, MD  PHYSICIAN ASSISTANT: Joya Gaskins, OPA-C, present and scrubbed throughout the case, critical for completion in a timely fashion, and for retraction, instrumentation, and closure.  ANESTHESIA:   General  PREOPERATIVE INDICATIONS:  Jesse Avila is a  64 y.o. male with a diagnosis of djd left knee who failed conservative measures and elected for surgical management.    The risks benefits and alternatives were discussed with the patient preoperatively including but not limited to the risks of infection, bleeding, nerve injury, cardiopulmonary complications, blood clots, the need for revision surgery, among others, and the patient was willing to proceed.  OPERATIVE IMPLANTS: Biomet Oxford mobile bearing medial compartment arthroplasty femur size MEDIUM, tibia size E, bearing size 4.  OPERATIVE FINDINGS: Endstage grade 4 medial compartment osteoarthritis. No significant changes in the lateral compartment, moderate medial patellofemoral joint changes.  The ACL was intact, although there was a little mucoid degeneration, but it was still functional.  OPERATIVE PROCEDURE: The patient was brought to the operating room placed in supine position. General anesthesia was administered. IV antibiotics were given. The lower extremity was placed in the legholder and prepped and draped in usual sterile fashion.  Time out was performed.  The leg was elevated and exsanguinated and the tourniquet was inflated. Anteromedial incision was performed, and I took care to preserve the MCL. Parapatellar incision was carried out, and the osteophytes were excised, along with the medial meniscus and a small portion of the fat pad.  The extra medullary tibial cutting jig was  applied, using the spoon and the 81m G-Clamp and the 2 mm shim, and I took care to protect the anterior cruciate ligament insertion and the tibial spine. The medial collateral ligament was also protected, and I resected my proximal tibia, matching the anatomic slope.   The proximal tibial bony cut was removed in one piece, and I turned my attention to the femur.  The intramedullary femoral rod was placed using the drill, and then using the appropriate reference, I assembled the femoral jig, setting my posterior cutting block. I resected my posterior femur, used the 0 spigot for the anterior femur, and then measured my gap.   I then used the appropriate mill to match the extension gap to the flexion gap. The second milling was at a 4.  The gaps were then measured again with the appropriate feeler gauges. Once I had balanced flexion and extension gaps, I then completed the preparation of the femur.  I milled off the anterior aspect of the distal femur to prevent impingement. I also exposed the tibia, and selected the above-named component, and then used the cutting jig to prepare the keel slot on the tibia. I also used the awl to curette out the bone to complete the preparation of the keel. The back wall was intact.  I then placed trial components, and it was found to have excellent motion, and appropriate balance.  I then cemented the components into place, cementing the tibia first, removing all excess cement, and then cementing the femur.  All loose cement was removed.  The real polyethylene insert was applied manually, and the knee was taken through functional range of motion, and found to have excellent stability and restoration of joint motion, with excellent balance.  The wounds  were irrigated copiously, and the parapatellar tissue closed with Vicryl, followed by Vicryl for the subcutaneous tissue, with routine closure with Steri-Strips and sterile gauze.  The tourniquet was released, and the  patient was awakened and extubated and returned to PACU in stable and satisfactory condition. There were no complications.

## 2014-12-26 NOTE — Transfer of Care (Signed)
Immediate Anesthesia Transfer of Care Note  Patient: Jesse Avila  Procedure(s) Performed: Procedure(s): UNICOMPARTMENTAL KNEE (Left)  Patient Location: PACU  Anesthesia Type:GA combined with regional for post-op pain  Level of Consciousness: awake, alert , oriented, patient cooperative and responds to stimulation  Airway & Oxygen Therapy: Patient Spontanous Breathing and Patient connected to nasal cannula oxygen  Post-op Assessment: Report given to RN, Post -op Vital signs reviewed and stable, Patient moving all extremities and Patient moving all extremities X 4  Post vital signs: Reviewed and stable  Last Vitals:  Filed Vitals:   12/26/14 0558  BP: 138/72  Pulse: 72  Temp: 36.8 C  Resp: 18    Complications: No apparent anesthesia complications

## 2014-12-26 NOTE — Evaluation (Signed)
Physical Therapy Evaluation Patient Details Name: Jesse Avila MRN: [id number] DOB: [birth year] Today's Date: 12/26/2014   History of Present Illness  Pt is a 64 y/o M s/p L unicompartmental knee arthroplast.  Pt's PMH includes PVD, HTN, DM type II, stroke, gout, and R knee arthroscopy.  Clinical Impression  Pt is s/p L TKA resulting in the deficits listed below (see PT Problem List). Pt demonstrated ability to ambulate 80 ft w/ min guard assist this session. Patient needs to practice stairs (2 steps, no rails) next session.  Pt will benefit from skilled PT to increase their independence and safety with mobility to allow discharge to the venue listed below.     Follow Up Recommendations Home health PT;Supervision for mobility/OOB    Equipment Recommendations  Rolling walker with 5" wheels    Recommendations for Other Services OT consult     Precautions / Restrictions Precautions Precautions: Knee;Fall Precaution Booklet Issued: Yes (comment) Precaution Comments: Reviewed no pillow under knee Required Braces or Orthoses: Knee Immobilizer - Left Knee Immobilizer - Left: Other (comment) (when walking, has since been d/c) Restrictions Weight Bearing Restrictions: Yes LLE Weight Bearing: Weight bearing as tolerated      Mobility  Bed Mobility Overal bed mobility: Needs Assistance Bed Mobility: Supine to Sit     Supine to sit: Supervision     General bed mobility comments: Verbal cues for sequencing and managing LLE w/ KI donned.  Increased time and min use of bed rail.  Transfers Overall transfer level: Needs assistance Equipment used: Rolling walker (2 wheeled) Transfers: Sit to/from Stand Sit to Stand: Min assist         General transfer comment: Pt w/ slight LOB upon standing when bringing BUEs off RW to adjust gown, min assist to maintain balance.  Verbal cues for hand placement during sit<>stand.  Ambulation/Gait Ambulation/Gait assistance: Min guard Ambulation  Distance (Feet): 80 Feet Assistive device: Rolling walker (2 wheeled) Gait Pattern/deviations: Step-to pattern;Step-through pattern;Decreased weight shift to left;Decreased stride length;Decreased stance time - left;Antalgic;Trunk flexed   Gait velocity interpretation: Below normal speed for age/gender General Gait Details: Dec WB LLE, trunk flexed, WB through BUEs to offload LLE  Stairs            Wheelchair Mobility    Modified Rankin (Stroke Patients Only)       Balance Overall balance assessment: Needs assistance Sitting-balance support: Feet supported;No upper extremity supported Sitting balance-Leahy Scale: Fair     Standing balance support: During functional activity;Bilateral upper extremity supported Standing balance-Leahy Scale: Fair                               Pertinent Vitals/Pain Pain Assessment: 0-10 Pain Score: 3  Pain Location: L knee Pain Descriptors / Indicators: Aching Pain Intervention(s): Limited activity within patient's tolerance;Monitored during session;Repositioned    Home Living Family/patient expects to be discharged to:: Private residence Living Arrangements: Spouse/significant other Available Help at Discharge: Family;Available 24 hours/day (wife took off 1 week from work) Type of Home: House Home Access: Stairs to enter Entrance Stairs-Rails: None Technical brewer of Steps: 2 Home Layout: One level Home Equipment: None      Prior Function Level of Independence: Independent               Hand Dominance   Dominant Hand: Left    Extremity/Trunk Assessment               Lower  Extremity Assessment: LLE deficits/detail   LLE Deficits / Details: weakness and limited ROM as expected s/p L TKA     Communication   Communication: No difficulties  Cognition Arousal/Alertness: Awake/alert Behavior During Therapy: WFL for tasks assessed/performed Overall Cognitive Status: Within Functional Limits for  tasks assessed                      General Comments      Exercises Total Joint Exercises Ankle Circles/Pumps: AROM;Both;10 reps;Supine Quad Sets: AROM;Both;10 reps;Supine Heel Slides: AROM;Left;5 reps;Supine Knee Flexion: AROM;Left;5 reps;Seated Goniometric ROM: 12-92      Assessment/Plan    PT Assessment Patient needs continued PT services  PT Diagnosis Difficulty walking;Abnormality of gait;Generalized weakness;Acute pain   PT Problem List Decreased strength;Decreased range of motion;Decreased activity tolerance;Decreased balance;Decreased mobility;Decreased coordination;Decreased knowledge of use of DME;Decreased safety awareness;Decreased knowledge of precautions;Decreased skin integrity;Pain  PT Treatment Interventions DME instruction;Gait training;Stair training;Functional mobility training;Therapeutic activities;Therapeutic exercise;Balance training;Neuromuscular re-education;Patient/family education;Modalities   PT Goals (Current goals can be found in the Care Plan section) Acute Rehab PT Goals Patient Stated Goal: to go home tomorrow PT Goal Formulation: With patient/family Time For Goal Achievement: 01/02/15 Potential to Achieve Goals: Good    Frequency 7X/week   Barriers to discharge Inaccessible home environment 2 steps to enter home w/o rails    Co-evaluation               End of Session Equipment Utilized During Treatment: Gait belt;Left knee immobilizer Activity Tolerance: Patient tolerated treatment well Patient left: in chair;with call bell/phone within reach;with family/visitor present Nurse Communication: Mobility status;Precautions;Weight bearing status         Time: 1539-1601 PT Time Calculation (min) (ACUTE ONLY): 22 min   Charges:   PT Evaluation $Initial PT Evaluation Tier I: 1 Procedure     PT G CodesJoslyn Hy PT, DPT [phone] Pager: [phone] 12/26/2014, 4:32 PM

## 2014-12-26 NOTE — Anesthesia Procedure Notes (Addendum)
Anesthesia Regional Block:  Femoral nerve block  Pre-Anesthetic Checklist: ,, timeout performed, Correct Patient, Correct Site, Correct Laterality, Correct Procedure,, site marked, risks and benefits discussed, Surgical consent,  Pre-op evaluation,  At surgeon's request and post-op pain management  Laterality: Left  Prep: chloraprep       Needles:  Injection technique: Single-shot  Needle Type: Echogenic Stimulator Needle     Needle Length: 9cm 9 cm Needle Gauge: 21 and 21 G    Additional Needles:  Procedures: nerve stimulator Femoral nerve block  Nerve Stimulator or Paresthesia:  Response: Quadriceps muscle contraction, 0.45 mA,   Additional Responses:   Narrative:  Start time: 12/26/2014 7:14 AM End time: 12/26/2014 7:23 AM Injection made incrementally with aspirations every 5 mL.  Performed by: Personally  Anesthesiologist: HODIERNE, ADAM  Additional Notes: Functioning IV was confirmed and monitors were applied.  A 31m 21ga Arrow echogenic stimulator needle was used. Sterile prep and drape,hand hygiene and sterile gloves were used.  Negative aspiration and negative test dose prior to incremental administration of local anesthetic. The patient tolerated the procedure well.     Procedure Name: LMA Insertion Date/Time: 12/26/2014 7:42 AM Performed by: FJacquiline DoeA Pre-anesthesia Checklist: Patient identified, Timeout performed, Emergency Drugs available, Suction available and Patient being monitored Patient Re-evaluated:Patient Re-evaluated prior to inductionOxygen Delivery Method: Circle system utilized Preoxygenation: Pre-oxygenation with 100% oxygen Intubation Type: IV induction Ventilation: Mask ventilation without difficulty LMA: LMA inserted LMA Size: 4.0 Tube type: Oral Number of attempts: 1 Placement Confirmation: breath sounds checked- equal and bilateral and positive ETCO2 Tube secured with: Tape Dental Injury: Teeth and Oropharynx as per  pre-operative assessment

## 2014-12-26 NOTE — Anesthesia Postprocedure Evaluation (Signed)
Anesthesia Post Note  Patient: Jesse Avila  Procedure(s) Performed: Procedure(s) (LRB): UNICOMPARTMENTAL KNEE (Left)  Anesthesia type: General  Patient location: PACU  Post pain: Pain level controlled and Adequate analgesia  Post assessment: Post-op Vital signs reviewed, Patient's Cardiovascular Status Stable, Respiratory Function Stable, Patent Airway and Pain level controlled  Last Vitals:  Filed Vitals:   12/26/14 1050  BP:   Pulse:   Temp: 36.4 C  Resp:     Post vital signs: Reviewed and stable  Level of consciousness: awake, alert  and oriented  Complications: No apparent anesthesia complications

## 2014-12-26 NOTE — H&P (Signed)
PREOPERATIVE H&P  Chief Complaint: djd left knee  HPI: Jesse Avila is a 64 y.o. male who presents for preoperative history and physical with a diagnosis of djd left knee. Symptoms are rated as moderate to severe, and have been worsening.  This is significantly impairing activities of daily living.  He has elected for surgical management. His pain has been there for years.  He has failed injections, activity modification, anti-inflammatories, and assistive devices.  Preoperative X-rays demonstrate end stage degenerative changes with osteophyte formation, loss of joint space, subchondral sclerosis.   Past Medical History  Diagnosis Date  . Hyperlipidemia     takes Fenofibrate and Crestor daily  . Peripheral vascular disease   . DJD (degenerative joint disease) of knee   . Carotid artery occlusion   . Essential hypertension, benign     takes Benicar daily  . 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 Insomnia     takes Ambien nightly as needed  . Stroke     takes Plavix daily as well as Pletal  . Joint pain   . History of gout   . History of colon polyps     benign  . Nocturia    Past Surgical History  Procedure Laterality Date  . Knee arthroscopy Right 11/2009  . Meniscus repair  11/2009  . Stents      PLACED IN ??BOTH LEGS   2013?  Marland Kitchen Endarterectomy Right 05/09/2014    Procedure: RIGHT CAROTID ENDARTERECTOMY WITH PATCH ANGIOPLASTY;  Surgeon: Conrad Loma [place], MD;  Location: [facility];  Service: Vascular;  Laterality: Right;  . Abdominal aortagram N/A 03/29/2012    Procedure: ABDOMINAL Maxcine Ham;  Surgeon: Angelia Mould, MD;  Location: [facility] CATH LAB;  Service: Cardiovascular;  Laterality: N/A;  . Percutaneous stent intervention N/A 05/10/2012    Procedure: PERCUTANEOUS STENT INTERVENTION;  Surgeon: Angelia Mould, MD;  Location: [facility] CATH LAB;  Service: Cardiovascular;   Laterality: N/A;  . Colonoscopy     History   Social History  . Marital Status: Married    Spouse Name: N/A  . Number of Children: N/A  . Years of Education: N/A   Social History Main Topics  . Smoking status: Former Smoker -- 0.00 packs/day for 0 years  . Smokeless tobacco: Never Used     Comment: quit smoking 4 yrs ago  . Alcohol Use: Yes     Comment: beer  . Drug Use: No  . Sexual Activity: No   Other Topics Concern  . None   Social History Narrative   Family History  Problem Relation Age of Onset  . Diabetes Mother   . Hypertension Mother   . Heart disease Mother     Coronary Artery Bypass Graft  . Hyperlipidemia Mother   . Heart attack Mother   . Hypertension Father   . Hyperlipidemia Sister   . Stroke Sister    Allergies  Allergen Reactions  . Cialis [Tadalafil] Other (See Comments)    Headache   Prior to Admission medications   Medication Sig Start Date End Date Taking? Authorizing Provider  acetaminophen (TYLENOL) 500 MG tablet Take 1,000 mg by mouth every 6 (six) hours as needed for mild pain.   Yes Historical Provider, MD  cilostazol (PLETAL) 100 MG tablet Take 100 mg by mouth 2 (two) times daily.   Yes Historical Provider, MD  fenofibrate 160 MG tablet  Take 160 mg by mouth daily.   Yes Historical Provider, MD  glimepiride (AMARYL) 1 MG tablet Take 1 mg by mouth daily with breakfast.  03/29/14  Yes Historical Provider, MD  metFORMIN (GLUCOPHAGE) 500 MG tablet Take 1 tablet (500 mg total) by mouth 3 (three) times daily. 04/18/14  Yes [name], DO  NAPROXEN DR 500 MG EC tablet Take 500 mg by mouth daily as needed (pain).  04/26/14  Yes Historical Provider, MD  olmesartan (BENICAR) 40 MG tablet Take 20 mg by mouth daily.    Yes Historical Provider, MD  rosuvastatin (CRESTOR) 40 MG tablet Take 20 mg by mouth daily. For cholesterol   Yes Historical Provider, MD  zolpidem (AMBIEN) 10 MG tablet Take 1 tablet (10 mg total) by mouth at bedtime as needed for  sleep. 10/18/13 12/25/14 Yes Mancel Bale, PA-C  ACCU-CHEK SMARTVIEW test strip  04/26/14   Historical Provider, MD  clopidogrel (PLAVIX) 75 MG tablet Take 1 tablet (75 mg total) by mouth daily. Patient not taking: Reported on 12/25/2014 04/16/14   Velta Addison Mikhail, DO  oxyCODONE (ROXICODONE) 5 MG immediate release tablet Take 1 tablet (5 mg total) by mouth every 6 (six) hours as needed for severe pain. Patient not taking: Reported on 07/14/2014 05/09/14   Aldona Bar J Rhyne, PA-C  valACYclovir (VALTREX) 1000 MG tablet Take 1 tablet (1,000 mg total) by mouth 3 (three) times daily. Patient not taking: Reported on 12/01/2014 05/07/14   Leandrew Koyanagi, MD     Positive ROS: All other systems have been reviewed and were otherwise negative with the exception of those mentioned in the HPI and as above.  Physical Exam: General: Alert, no acute distress Cardiovascular: No pedal edema Respiratory: No cyanosis, no use of accessory musculature GI: No organomegaly, abdomen is soft and non-tender Skin: No lesions in the area of chief complaint Neurologic: Sensation intact distally Psychiatric: Patient is competent for consent with normal mood and affect Lymphatic: No axillary or cervical lymphadenopathy  MUSCULOSKELETAL: Left knee has varus alignment with crepitance medially and range of motion 0-125 of pseudo-laxity and intact Lockman. Negative patellar grind.  X-rays of the left knee demonstrate end-stage varus osteoarthritis with preservation of the lateral side on stress views, minimal patellofemoral changes.  Assessment: Left knee anteromedial osteoarthritis with coexisting risk factors including history of stroke and carotid endarterectomy and diabetes.  Plan: Plan for Procedure(s): UNICOMPARTMENTAL KNEE  The risks benefits and alternatives were discussed with the patient including but not limited to the risks of nonoperative treatment, versus surgical intervention including infection, bleeding,  nerve injury,  blood clots, cardiopulmonary complications, morbidity, mortality, among others, and they were willing to proceed.   Johnny Bridge, MD Cell [phone]   12/26/2014 7:08 AM

## 2014-12-27 ENCOUNTER — Encounter (HOSPITAL_COMMUNITY): Payer: Self-pay | Admitting: Orthopedic Surgery

## 2014-12-27 LAB — BASIC METABOLIC PANEL
Anion gap: 8 (ref 5–15)
BUN: 26 mg/dL — ABNORMAL HIGH (ref 6–20)
CHLORIDE: 106 mmol/L (ref 101–111)
CO2: 23 mmol/L (ref 22–32)
Calcium: 9 mg/dL (ref 8.9–10.3)
Creatinine, Ser: 1.41 mg/dL — ABNORMAL HIGH (ref 0.61–1.24)
GFR calc Af Amer: >60 mL/min (ref >60)
GFR calc non Af Amer: 52 mL/min — ABNORMAL LOW (ref >60)
Glucose, Bld: 171 mg/dL — ABNORMAL HIGH (ref 65–99)
Potassium: 4.3 mmol/L (ref 3.5–5.1)
SODIUM: 137 mmol/L (ref 135–145)

## 2014-12-27 LAB — GLUCOSE, CAPILLARY
GLUCOSE-CAPILLARY: 143 mg/dL — AB (ref 65–99)
Glucose-Capillary: 166 mg/dL — ABNORMAL HIGH (ref 65–99)

## 2014-12-27 LAB — CBC
HCT: 31.3 % — ABNORMAL LOW (ref 39.0–52.0)
HEMOGLOBIN: 10.5 g/dL — AB (ref 13.0–17.0)
MCH: 31.8 pg (ref 26.0–34.0)
MCHC: 33.5 g/dL (ref 30.0–36.0)
MCV: 94.8 fL (ref 78.0–100.0)
PLATELETS: 147 10*3/uL — AB (ref 150–400)
RBC: 3.3 MIL/uL — AB (ref 4.22–5.81)
RDW: 12.7 % (ref 11.5–15.5)
WBC: 7.3 10*3/uL (ref 4.0–10.5)

## 2014-12-27 NOTE — Evaluation (Addendum)
Occupational Therapy Evaluation Patient Details Name: Jesse Avila MRN: [id number] DOB: 11/27/1950 Today's Date: 12/27/2014    History of Present Illness Pt is a 64 y.o. M s/p L unicompartmental knee arthroplast.  Pt's PMH includes PVD, HTN, DM type II, stroke, gout, and R knee arthroscopy.   Clinical Impression   Pt s/p above. Pt moving well and education provided to pt and his wife. Feel pt is safe to d/c home, from OT standpoint.    Follow Up Recommendations  No OT follow up;Supervision - Intermittent    Equipment Recommendations  3 in 1 bedside comode    Recommendations for Other Services       Precautions / Restrictions Precautions Precautions: Knee;Fall Precaution Booklet Issued: No Precaution Comments: educated/reviewed precautions Required Braces or Orthoses: Knee Immobilizer - Left  Restrictions Weight Bearing Restrictions: Yes LLE Weight Bearing: Weight bearing as tolerated      Mobility Bed Mobility               General bed mobility comments: not assessed  Transfers Overall transfer level: Modified independent   Transfers: Sit to/from Stand Sit to Stand: Modified independent (Device/Increase time)              Balance  No LOB in session-balance not formally assessed.                                          ADL Overall ADL's : Needs assistance/impaired                 Upper Body Dressing : Supervision/safety; Set up; Standing   Lower Body Dressing: Supervision/safety;Set up;Sit to/from stand   Toilet Transfer: Supervision/safety;Ambulation;RW (chair)           Functional mobility during ADLs: Supervision/safety;Rolling walker General ADL Comments: Educated on LB dressing technique and explained benefit of reaching to left foot as it allows knee to bend. Educated on safety such as safe footwear, use of bag on walker, rugs/items on floor, and sitting for LB ADLs. Educated on tub transfer techniques and  recommended pt not step over tub for now. Discussed use of reacher.     Vision     Perception     Praxis      Pertinent Vitals/Pain Pain Assessment: 0-10 Pain Score: 2  Pain Location: left knee Pain Descriptors / Indicators: Aching ("deep pain") Pain Intervention(s): Repositioned;Monitored during session     Hand Dominance Left   Extremity/Trunk Assessment Upper Extremity Assessment Upper Extremity Assessment: Overall WFL for tasks assessed   Lower Extremity Assessment Lower Extremity Assessment: Defer to PT evaluation       Communication Communication Communication: No difficulties   Cognition Arousal/Alertness: Awake/alert Behavior During Therapy: WFL for tasks assessed/performed Overall Cognitive Status: Within Functional Limits for tasks assessed                     General Comments       Exercises       Shoulder Instructions      Home Living Family/patient expects to be discharged to:: Private residence Living Arrangements: Spouse/significant other Available Help at Discharge: Family;Available 24 hours/day (for one week) Type of Home: House Home Access: Stairs to enter [employer] of Steps: 2 Entrance Stairs-Rails: None Home Layout: One level     Bathroom Shower/Tub: Tub/shower unit;Door   [employer] Toilet: Standard     Home  Equipment:  (reports getting 3 in 1)          Prior Functioning/Environment Level of Independence: Independent             OT Diagnosis: Acute pain   OT Problem List:     OT Treatment/Interventions:      OT Goals(Current goals can be found in the care plan section) Acute Rehab OT Goals Patient Stated Goal: go home  OT Frequency:     Barriers to D/C:            Co-evaluation              End of Session Equipment Utilized During Treatment: Rolling walker;Left knee immobilizer  Activity Tolerance: Patient tolerated treatment well Patient left: in chair;with call bell/phone  within reach;with family/visitor present   Time: 4037-0964 OT Time Calculation (min): 18 min Charges:  OT General Charges $OT Visit: 1 Procedure OT Evaluation $Initial OT Evaluation Tier I: 1 Procedure G-CodesBenito Mccreedy OTR/L [phone] 12/27/2014, 10:12 AM

## 2014-12-27 NOTE — Progress Notes (Signed)
Physical Therapy Treatment Patient Details Name: Jesse Avila MRN: [id number] DOB: 03/24/1951 Today's Date: 12/27/2014    History of Present Illness Pt is a 64 y.o. M s/p L unicompartmental knee arthroplast.  Pt's PMH includes PVD, HTN, DM type II, stroke, gout, and R knee arthroscopy.    PT Comments    Patient is making good progress with PT.  From a mobility standpoint anticipate patient will be ready for DC home today.  Pt demonstrated ability to ambulate 300 ft and ascend/descend 2 steps this session.    Follow Up Recommendations  Home health PT;Supervision for mobility/OOB     Equipment Recommendations  Rolling walker with 5" wheels    Recommendations for Other Services OT consult     Precautions / Restrictions Precautions Precautions: Knee;Fall Precaution Booklet Issued: No Precaution Comments: reviewed no pillow under knee Required Braces or Orthoses: Knee Immobilizer - Left Knee Immobilizer - Left: Other (comment) (when walking, has since been d/c) Restrictions Weight Bearing Restrictions: Yes LLE Weight Bearing: Weight bearing as tolerated    Mobility  Bed Mobility               General bed mobility comments: in recliner  Transfers Overall transfer level: Modified independent Equipment used: Rolling walker (2 wheeled) Transfers: Sit to/from Stand Sit to Stand: Modified independent (Device/Increase time)         General transfer comment: Good carryover from last session, increased time  Ambulation/Gait Ambulation/Gait assistance: Supervision Ambulation Distance (Feet): 300 Feet Assistive device: Rolling walker (2 wheeled) Gait Pattern/deviations: Step-through pattern;Trunk flexed;Decreased weight shift to left;Antalgic   Gait velocity interpretation: Below normal speed for age/gender General Gait Details: Supervision for safety, good carryover from previous session   Stairs Stairs: Yes Stairs assistance: Min guard Stair Management: No  rails;Backwards;With walker;Step to pattern Number of Stairs: 2 General stair comments: Verbal cues for proper sequencing.  Pt's wife assist w/ stabilizing walker.    Wheelchair Mobility    Modified Rankin (Stroke Patients Only)       Balance Overall balance assessment: Needs assistance Sitting-balance support: No upper extremity supported;Feet supported Sitting balance-Leahy Scale: Good     Standing balance support: Bilateral upper extremity supported;During functional activity Standing balance-Leahy Scale: Good                      Cognition Arousal/Alertness: Awake/alert Behavior During Therapy: WFL for tasks assessed/performed Overall Cognitive Status: Within Functional Limits for tasks assessed                      Exercises Total Joint Exercises Hip ABduction/ADduction: AROM;Left;10 reps;Seated Straight Leg Raises: AROM;Left;5 reps;Seated Knee Flexion: AROM;Left;5 reps;Seated Goniometric ROM: 13-92    General Comments        Pertinent Vitals/Pain Pain Assessment: 0-10 Pain Score: 2  Pain Location: L knee Pain Descriptors / Indicators: Aching Pain Intervention(s): Limited activity within patient's tolerance;Monitored during session;Repositioned    Home Living Family/patient expects to be discharged to:: Private residence Living Arrangements: Spouse/significant other Available Help at Discharge: Family;Available 24 hours/day (for one week) Type of Home: House Home Access: Stairs to enter Entrance Stairs-Rails: None Home Layout: One level Home Equipment:  (reports getting 3 in 1)      Prior Function Level of Independence: Independent          PT Goals (current goals can now be found in the care plan section) Acute Rehab PT Goals Patient Stated Goal: go home Progress towards PT goals: Progressing  toward goals    Frequency  7X/week    PT Plan Current plan remains appropriate    Co-evaluation             End of Session  Equipment Utilized During Treatment: Gait belt;Left knee immobilizer Activity Tolerance: Patient tolerated treatment well Patient left: in chair;with call bell/phone within reach;with family/visitor present     Time: 5929-2446 PT Time Calculation (min) (ACUTE ONLY): 19 min  Charges:  $Gait Training: 8-22 mins                    G Codes:      Joslyn Hy PT, [place] [phone] Pager: [phone] 12/27/2014, 11:15 AM

## 2014-12-27 NOTE — Discharge Instructions (Signed)
INSTRUCTIONS AFTER JOINT REPLACEMENT  ° °o Remove items at home which could result in a fall. This includes throw rugs or furniture in walking pathways °o ICE to the affected joint every three hours while awake for 30 minutes at a time, for at least the first 3-5 days, and then as needed for pain and swelling.  Continue to use ice for pain and swelling. You may notice swelling that will progress down to the foot and ankle.  This is normal after surgery.  Elevate your leg when you are not up walking on it.   °o Continue to use the breathing machine you got in the hospital (incentive spirometer) which will help keep your temperature down.  It is common for your temperature to cycle up and down following surgery, especially at night when you are not up moving around and exerting yourself.  The breathing machine keeps your lungs expanded and your temperature down. ° ° °DIET:  As you were doing prior to hospitalization, we recommend a well-balanced diet. ° °DRESSING / WOUND CARE / SHOWERING ° °You may change your dressing 3-5 days after surgery.  Then change the dressing every day with sterile gauze.  Please use good hand washing techniques before changing the dressing.  Do not use any lotions or creams on the incision until instructed by your surgeon. ° °ACTIVITY ° °o Increase activity slowly as tolerated, but follow the weight bearing instructions below.   °o No driving for 6 weeks or until further direction given by your physician.  You cannot drive while taking narcotics.  °o No lifting or carrying greater than 10 lbs. until further directed by your surgeon. °o Avoid periods of inactivity such as sitting longer than an hour when not asleep. This helps prevent blood clots.  °o You may return to work once you are authorized by your doctor.  ° ° ° °WEIGHT BEARING  ° °Weight bearing as tolerated with assist device (walker, cane, etc) as directed, use it as long as suggested by your surgeon or therapist, typically at  least 4-6 weeks. ° ° °EXERCISES ° °Results after joint replacement surgery are often greatly improved when you follow the exercise, range of motion and muscle strengthening exercises prescribed by your doctor. Safety measures are also important to protect the joint from further injury. Any time any of these exercises cause you to have increased pain or swelling, decrease what you are doing until you are comfortable again and then slowly increase them. If you have problems or questions, call your caregiver or physical therapist for advice.  ° °Rehabilitation is important following a joint replacement. After just a few days of immobilization, the muscles of the leg can become weakened and shrink (atrophy).  These exercises are designed to build up the tone and strength of the thigh and leg muscles and to improve motion. Often times heat used for twenty to thirty minutes before working out will loosen up your tissues and help with improving the range of motion but do not use heat for the first two weeks following surgery (sometimes heat can increase post-operative swelling).  ° °These exercises can be done on a training (exercise) mat, on the floor, on a table or on a bed. Use whatever works the best and is most comfortable for you.    Use music or television while you are exercising so that the exercises are a pleasant break in your day. This will make your life better with the exercises acting as a break   in your routine that you can look forward to.   Perform all exercises about fifteen times, three times per day or as directed.  You should exercise both the operative leg and the other leg as well. ° °Exercises include: °  °• Quad Sets - Tighten up the muscle on the front of the thigh (Quad) and hold for 5-10 seconds.   °• Straight Leg Raises - With your knee straight (if you were given a brace, keep it on), lift the leg to 60 degrees, hold for 3 seconds, and slowly lower the leg.  Perform this exercise against  resistance later as your leg gets stronger.  °• Leg Slides: Lying on your back, slowly slide your foot toward your buttocks, bending your knee up off the floor (only go as far as is comfortable). Then slowly slide your foot back down until your leg is flat on the floor again.  °• Angel Wings: Lying on your back spread your legs to the side as far apart as you can without causing discomfort.  °• Hamstring Strength:  Lying on your back, push your heel against the floor with your leg straight by tightening up the muscles of your buttocks.  Repeat, but this time bend your knee to a comfortable angle, and push your heel against the floor.  You may put a pillow under the heel to make it more comfortable if necessary.  ° °A rehabilitation program following joint replacement surgery can speed recovery and prevent re-injury in the future due to weakened muscles. Contact your doctor or a physical therapist for more information on knee rehabilitation.  ° ° °CONSTIPATION ° °Constipation is defined medically as fewer than three stools per week and severe constipation as less than one stool per week.  Even if you have a regular bowel pattern at home, your normal regimen is likely to be disrupted due to multiple reasons following surgery.  Combination of anesthesia, postoperative narcotics, change in appetite and fluid intake all can affect your bowels.  ° °YOU MUST use at least one of the following options; they are listed in order of increasing strength to get the job done.  They are all available over the counter, and you may need to use some, POSSIBLY even all of these options:   ° °Drink plenty of fluids (prune juice may be helpful) and high fiber foods °Colace 100 mg by mouth twice a day  °Senokot for constipation as directed and as needed Dulcolax (bisacodyl), take with full glass of water  °Miralax (polyethylene glycol) once or twice a day as needed. ° °If you have tried all these things and are unable to have a bowel  movement in the first 3-4 days after surgery call either your surgeon or your primary doctor.   ° °If you experience loose stools or diarrhea, hold the medications until you stool forms back up.  If your symptoms do not get better within 1 week or if they get worse, check with your doctor.  If you experience "the worst abdominal pain ever" or develop nausea or vomiting, please contact the office immediately for further recommendations for treatment. ° ° °ITCHING:  If you experience itching with your medications, try taking only a single pain pill, or even half a pain pill at a time.  You can also use Benadryl over the counter for itching or also to help with sleep.  ° °TED HOSE STOCKINGS:  Use stockings on both legs until for at least 2 weeks or as   directed by physician office. They may be removed at night for sleeping. ° °MEDICATIONS:  See your medication summary on the “After Visit Summary” that nursing will review with you.  You may have some home medications which will be placed on hold until you complete the course of blood thinner medication.  It is important for you to complete the blood thinner medication as prescribed. ° °PRECAUTIONS:  If you experience chest pain or shortness of breath - call 911 immediately for transfer to the hospital emergency department.  ° °If you develop a fever greater that 101 F, purulent drainage from wound, increased redness or drainage from wound, foul odor from the wound/dressing, or calf pain - CONTACT YOUR SURGEON.   °                                                °FOLLOW-UP APPOINTMENTS:  If you do not already have a post-op appointment, please call the office for an appointment to be seen by your surgeon.  Guidelines for how soon to be seen are listed in your “After Visit Summary”, but are typically between 1-4 weeks after surgery. ° °OTHER INSTRUCTIONS:  ° °Knee Replacement:  Do not place pillow under knee, focus on keeping the knee straight while resting. CPM  instructions: 0-90 degrees, 2 hours in the morning, 2 hours in the afternoon, and 2 hours in the evening. Place foam block, curve side up under heel at all times except when in CPM or when walking.  DO NOT modify, tear, cut, or change the foam block in any way. ° °MAKE SURE YOU:  °• Understand these instructions.  °• Get help right away if you are not doing well or get worse.  ° ° °Thank you for letting us be a part of your medical care team.  It is a privilege we respect greatly.  We hope these instructions will help you stay on track for a fast and full recovery!  ° °Information on my medicine - XARELTO® (Rivaroxaban) ° °This medication education was reviewed with me or my healthcare representative as part of my discharge preparation. ° °Why was Xarelto® prescribed for you? °Xarelto® was prescribed for you to reduce the risk of blood clots forming after orthopedic surgery. The medical term for these abnormal blood clots is venous thromboembolism (VTE). ° °What do you need to know about xarelto® ? °Take your Xarelto® ONCE DAILY at the same time every day. °You may take it either with or without food. ° °If you have difficulty swallowing the tablet whole, you may crush it and mix in applesauce just prior to taking your dose. ° °Take Xarelto® exactly as prescribed by your doctor and DO NOT stop taking Xarelto® without talking to the doctor who prescribed the medication.  Stopping without other VTE prevention medication to take the place of Xarelto® may increase your risk of developing a clot. ° °After discharge, you should have regular check-up appointments with your healthcare provider that is prescribing your Xarelto®.   ° °What do you do if you miss a dose? °If you miss a dose, take it as soon as you remember on the same day then continue your regularly scheduled once daily regimen the next day. Do not take two doses of Xarelto® on the same day.  ° °Important Safety Information °A possible side effect of Xarelto®    is bleeding. You should call your healthcare provider right away if you experience any of the following: °? Bleeding from an injury or your nose that does not stop. °? Unusual colored urine (red or dark brown) or unusual colored stools (red or black). °? Unusual bruising for unknown reasons. °? A serious fall or if you hit your head (even if there is no bleeding). ° °Some medicines may interact with Xarelto® and might increase your risk of bleeding while on Xarelto®. To help avoid this, consult your healthcare provider or pharmacist prior to using any new prescription or non-prescription medications, including herbals, vitamins, non-steroidal anti-inflammatory drugs (NSAIDs) and supplements. ° °This website has more information on Xarelto®: www.xarelto.com. ° ° ° ° °

## 2014-12-27 NOTE — Progress Notes (Signed)
Spoke with patient over the phone.   Patient planning to go home today, but got dressing wet overnight.  Ok for nursing to change dressing prior to dc if it is wet.  Johnny Bridge, MD

## 2014-12-27 NOTE — Discharge Summary (Signed)
Physician Discharge Summary  Patient ID: Jesse Avila MRN: [id number] DOB/AGE: [birth year] 64 y.o.  Admit date: 12/26/2014 Discharge date: 12/27/2014  Admission Diagnoses:  Primary localized osteoarthritis of left knee  Discharge Diagnoses:  Principal Problem:   Primary localized osteoarthritis of left knee Active Problems:   Knee osteoarthritis   Past Medical History  Diagnosis Date  . Hyperlipidemia     takes Fenofibrate and Crestor daily  . Peripheral vascular disease   . DJD (degenerative joint disease) of knee   . Carotid artery occlusion   . Essential hypertension, benign     takes Benicar daily  . 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 Insomnia     takes Ambien nightly as needed  . Stroke     takes Plavix daily as well as Pletal  . Joint pain   . History of gout   . History of colon polyps     benign  . Nocturia   . Primary localized osteoarthritis of left knee 12/26/2014    Surgeries: Procedure(s): UNICOMPARTMENTAL KNEE on 12/26/2014   Consultants (if any):    Discharged Condition: Improved  Hospital Course: Jesse Avila is an 64 y.o. male who was admitted 12/26/2014 with a diagnosis of Primary localized osteoarthritis of left knee and went to the operating room on 12/26/2014 and underwent the above named procedures.    He was given perioperative antibiotics:  Anti-infectives    Start     Dose/Rate Route Frequency Ordered Stop   12/26/14 1415  ceFAZolin (ANCEF) IVPB 2 g/50 mL premix     2 g 100 mL/hr over 30 Minutes Intravenous Every 6 hours 12/26/14 1409 12/26/14 2117   12/26/14 0556  ceFAZolin (ANCEF) IVPB 2 g/50 mL premix     2 g 100 mL/hr over 30 Minutes Intravenous On call to O.R. 12/26/14 [id number] 12/26/14 0755    .  He was given sequential compression devices, early ambulation, and xarelto for DVT prophylaxis.  He benefited maximally from  the hospital stay and there were no complications.    Recent vital signs:  Filed Vitals:   12/27/14 0532  BP: 119/62  Pulse: 90  Temp: 98 F (36.7 C)  Resp: 17    Recent laboratory studies:  Lab Results  Component Value Date   HGB 10.5* 12/27/2014   HGB 11.8* 12/14/2014   HGB 9.2* 05/10/2014   Lab Results  Component Value Date   WBC 7.3 12/27/2014   PLT 147* 12/27/2014   Lab Results  Component Value Date   INR 1.03 05/08/2014   Lab Results  Component Value Date   NA 137 12/27/2014   K 4.3 12/27/2014   CL 106 12/27/2014   CO2 23 12/27/2014   BUN 26* 12/27/2014   CREATININE 1.41* 12/27/2014   GLUCOSE 171* 12/27/2014    Discharge Medications:     Medication List    STOP taking these medications        acetaminophen 500 MG tablet  Commonly known as:  TYLENOL     clopidogrel 75 MG tablet  Commonly known as:  PLAVIX     NAPROXEN DR 500 MG EC tablet  Generic drug:  naproxen     oxyCODONE 5 MG immediate release tablet  Commonly known as:  ROXICODONE     zolpidem 10 MG tablet  Commonly known as:  AMBIEN      TAKE these medications  ACCU-CHEK SMARTVIEW test strip  Generic drug:  glucose blood     baclofen 10 MG tablet  Commonly known as:  LIORESAL  Take 1 tablet (10 mg total) by mouth 3 (three) times daily. As needed for muscle spasm     cilostazol 100 MG tablet  Commonly known as:  PLETAL  Take 100 mg by mouth 2 (two) times daily.     fenofibrate 160 MG tablet  Take 160 mg by mouth daily.     glimepiride 1 MG tablet  Commonly known as:  AMARYL  Take 1 mg by mouth daily with breakfast.     metFORMIN 500 MG tablet  Commonly known as:  GLUCOPHAGE  Take 1 tablet (500 mg total) by mouth 3 (three) times daily.     olmesartan 40 MG tablet  Commonly known as:  BENICAR  Take 20 mg by mouth daily.     oxyCODONE-acetaminophen 10-325 MG per tablet  Commonly known as:  PERCOCET  Take 1-2 tablets by mouth every 6 (six) hours as needed for  pain. MAXIMUM TOTAL ACETAMINOPHEN DOSE IS 4000 MG PER DAY     rivaroxaban 10 MG Tabs tablet  Commonly known as:  XARELTO  Take 1 tablet (10 mg total) by mouth daily.     rosuvastatin 40 MG tablet  Commonly known as:  CRESTOR  Take 20 mg by mouth daily. For cholesterol     sennosides-docusate sodium 8.6-50 MG tablet  Commonly known as:  SENOKOT-S  Take 2 tablets by mouth daily.     valACYclovir 1000 MG tablet  Commonly known as:  VALTREX  Take 1 tablet (1,000 mg total) by mouth 3 (three) times daily.        Diagnostic Studies: Dg Knee Left Port  12/26/2014   CLINICAL DATA:  Patient status post unicompartmental knee replacement.  EXAM: PORTABLE LEFT KNEE - 1-2 VIEW  COMPARISON:  None.  FINDINGS: Patient status post unicompartmental knee replacement. Hardware appears intact without evidence for acute process. Vascular calcifications. Gas about the knee joint compatible postoperative state.  IMPRESSION: Postoperative changes compatible with medial compartment knee replacement involving the medial compartment.   Electronically Signed   By: Lovey Newcomer M.D.   On: 12/26/2014 11:02    Disposition: 01-Home or Self Care        Follow-up Information    Follow up with Johnny Bridge, MD. Schedule an appointment as soon as possible for a visit in 2 weeks.   Specialty:  Orthopedic Surgery   Contact information:   [place] [phone]        Signed: Johnny Bridge 12/27/2014, 8:38 AM

## 2014-12-27 NOTE — Progress Notes (Signed)
Patient ID: REI MEDLEN, male   DOB: 1950-12-07, 64 y.o.   MRN: [id number]     Subjective:  Patient reports pain as mild.  Patient sitting up in bed in no acute distress and denies any CP or SOB.  Wanting or go home today  Objective:   VITALS:   Filed Vitals:   12/26/14 1452 12/26/14 2028 12/27/14 0047 12/27/14 0532  BP: 127/67 111/61 123/71 119/62  Pulse: 77 103 99 90  Temp: 99.3 F (37.4 C) 98.9 F (37.2 C) 98.5 F (36.9 C) 98 F (36.7 C)  TempSrc: Oral Oral  Oral  Resp: '20 18 18 17  '$ Height:      Weight:      SpO2: 94% 92% 96% 93%    ABD soft Sensation intact distally Dorsiflexion/Plantar flexion intact Incision: dressing C/D/I and no drainage Good foot and ankle motion  Lab Results  Component Value Date   WBC 7.3 12/27/2014   HGB 10.5* 12/27/2014   HCT 31.3* 12/27/2014   MCV 94.8 12/27/2014   PLT 147* 12/27/2014   BMET    Component Value Date/Time   NA 137 12/27/2014 0452   K 4.3 12/27/2014 0452   CL 106 12/27/2014 0452   CO2 23 12/27/2014 0452   GLUCOSE 171* 12/27/2014 0452   BUN 26* 12/27/2014 0452   CREATININE 1.41* 12/27/2014 0452   CALCIUM 9.0 12/27/2014 0452   GFRNONAA 52* 12/27/2014 0452   GFRAA >60 12/27/2014 0452     Assessment/Plan: 1 Day Post-Op   Principal Problem:   Primary localized osteoarthritis of left knee Active Problems:   Knee osteoarthritis   Advance diet Up with therapy Discharge home with home health  DC per Dr Luanna Cole orders WBAT Dry dressing PRN    DOUGLAS PARRY, BRANDON 12/27/2014, 7:22 AM  Discussed and agree with above.  Marchia Bond, MD Cell [phone]

## 2014-12-27 NOTE — Care Management Note (Signed)
Case Management Note  Patient Details  Name: Jesse Avila MRN: [id number] Date of Birth: 21-Feb-1951  Subjective/Objective:     S/p left TKA               Action/Plan: Set up with [facility] for HHPT by MD office. Spoke with patient, no change in d/c plan, [employer] provided rolling walker and 3N1. Patient's wife to assist after discharge.    Expected Discharge Date:  12/28/14               Expected Discharge Plan:  [place]  In-House Referral:     Discharge planning Services  CM Consult  Post Acute Care Choice:  Durable Medical Equipment, Home Health Choice offered to:  Patient  DME Arranged:  3-N-1, Walker rolling DME Agency:  [employer]  HH Arranged:  PT HH Agency:  [facility]  Status of Service:  Completed, signed off  Medicare Important Message Given:    Date Medicare IM Given:    Medicare IM give by:    Date Additional Medicare IM Given:    Additional Medicare Important Message give by:     If discussed at [place] of Stay Meetings, dates discussed:    Additional Comments:  Nila Nephew, RN 12/27/2014, 3:10 PM

## 2015-02-27 ENCOUNTER — Telehealth: Payer: Self-pay | Admitting: Vascular Surgery

## 2015-02-27 NOTE — Telephone Encounter (Signed)
Patient walked in stating that he was having cramping in his legs and wanted to see [facility]. He stated that he called yesterday and then missed our return calls. Please call him at his home # (verified) to discuss. He is aware that our nurse will call him to triage- OK to leave a message if by chance he misses the call again. ([facility]'s next available appointment is not until 08/17)

## 2015-02-28 NOTE — Telephone Encounter (Signed)
rec'd phone call from pt.  Reported he continues to have a cramping pain in the calves of both LE's with walking or exercising.  Stated the discomfort is a little greater in the right calf, compared to the left calf.  Reported he has been exercising, as instructed by Dr. Bridgett Larsson, but hasn't had improvement in symptoms.  Also reported he has right great toe numbness that has been present, but he isn't sure if he relayed this to Dr. Bridgett Larsson, when he was evaluated in April.  Denied any discoloration or change in temperature of (R) great toe.  Denied any swelling of bilat. lower extremities. Denied rest pain.  Denied any open sores.  Advised will call pt. back with appt. for evaluation.  Advised to call office if symptoms worsen prior to appt.  Verb. understanding.

## 2015-02-28 NOTE — Telephone Encounter (Signed)
LM for pt with appt for 08/18 @ 3:00/4:00, dpm

## 2015-02-28 NOTE — Telephone Encounter (Signed)
Attempted to return call to pt. to discuss symptoms; left message to call office @ [phone].

## 2015-03-15 ENCOUNTER — Other Ambulatory Visit: Payer: Self-pay | Admitting: *Deleted

## 2015-03-15 DIAGNOSIS — I739 Peripheral vascular disease, unspecified: Secondary | ICD-10-CM

## 2015-03-21 ENCOUNTER — Encounter: Payer: Self-pay | Admitting: Vascular Surgery

## 2015-03-22 ENCOUNTER — Other Ambulatory Visit: Payer: Self-pay

## 2015-03-22 ENCOUNTER — Ambulatory Visit (INDEPENDENT_AMBULATORY_CARE_PROVIDER_SITE_OTHER): Payer: 59 | Admitting: Vascular Surgery

## 2015-03-22 ENCOUNTER — Ambulatory Visit (HOSPITAL_COMMUNITY)
Admission: RE | Admit: 2015-03-22 | Discharge: 2015-03-22 | Disposition: A | Discharge status: Home / Self Care | Payer: 59 | Source: Ambulatory Visit | Attending: Vascular Surgery | Admitting: Vascular Surgery

## 2015-03-22 ENCOUNTER — Encounter: Payer: Self-pay | Admitting: Vascular Surgery

## 2015-03-22 VITALS — BP 118/58 | HR 96 | Temp 97.5°F | Resp 16 | Ht 73.0 in | Wt 213.0 lb

## 2015-03-22 DIAGNOSIS — I739 Peripheral vascular disease, unspecified: Secondary | ICD-10-CM | POA: Insufficient documentation

## 2015-03-22 DIAGNOSIS — I70229 Atherosclerosis of native arteries of extremities with rest pain, unspecified extremity: Secondary | ICD-10-CM

## 2015-03-22 DIAGNOSIS — I998 Other disorder of circulatory system: Secondary | ICD-10-CM

## 2015-03-22 NOTE — Progress Notes (Signed)
Established Intermittent Claudication  History of Present Illness  Jesse Avila is a 64 y.o. (10-15-1950) male who presents with chief complaint: R>L calf pain.  Pt s/p B CIA stenting (03/29/12) and R CIA PTA (05/10/12) both with Dr. Scot Dock.  The patient's symptoms have progressed.  The patient's symptoms are: severe cramping in both calves with even short distance ambulation.  This patient was previously compliant with the walking plan until his recent L partial knee replacement.  When he resumed ambulation, he had severe sx..  Pt denies any recent cardiac arrhythmias.  Past Medical History  Diagnosis Date  . Hyperlipidemia     takes Fenofibrate and Crestor daily  . Peripheral vascular disease   . DJD (degenerative joint disease) of knee   . Carotid artery occlusion   . Essential hypertension, benign     takes Benicar daily  . 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 Insomnia     takes Ambien nightly as needed  . Stroke     takes Plavix daily as well as Pletal  . Joint pain   . History of gout   . History of colon polyps     benign  . Nocturia   . Primary localized osteoarthritis of left knee 12/26/2014    Past Surgical History  Procedure Laterality Date  . Knee arthroscopy Right 11/2009  . Meniscus repair  11/2009  . Stents      PLACED IN ??BOTH LEGS   2013?  Marland Kitchen Endarterectomy Right 05/09/2014    Procedure: RIGHT CAROTID ENDARTERECTOMY WITH PATCH ANGIOPLASTY;  Surgeon: Conrad Bangor, MD;  Location: [place];  Service: Vascular;  Laterality: Right;  . Abdominal aortagram N/A 03/29/2012    Procedure: ABDOMINAL Maxcine Ham;  Surgeon: Angelia Mould, MD;  Location: [facility] CATH LAB;  Service: Cardiovascular;  Laterality: N/A;  . Percutaneous stent intervention N/A 05/10/2012    Procedure: PERCUTANEOUS STENT INTERVENTION;  Surgeon: Angelia Mould, MD;  Location: [facility] CATH  LAB;  Service: Cardiovascular;  Laterality: N/A;  . Colonoscopy    . Partial knee arthroplasty Left 12/26/2014    Procedure: UNICOMPARTMENTAL KNEE;  Surgeon: Marchia Bond, MD;  Location: [place];  Service: Orthopedics;  Laterality: Left;    Social History   Social History  . Marital Status: Married    Spouse Name: N/A  . Number of Children: N/A  . Years of Education: N/A   Occupational History  . Not on file.   Social History Main Topics  . Smoking status: Former Smoker -- 0.00 packs/day for 0 years  . Smokeless tobacco: Never Used     Comment: quit smoking 4 yrs ago  . Alcohol Use: 0.0 oz/week    0 Standard drinks or equivalent per week     Comment: beer  . Drug Use: No  . Sexual Activity: No   Other Topics Concern  . Not on file   Social History Narrative    Family History  Problem Relation Age of Onset  . Diabetes Mother   . Hypertension Mother   . Heart disease Mother     Coronary Artery Bypass Graft  . Hyperlipidemia Mother   . Heart attack Mother   . Hypertension Father   . Hyperlipidemia Sister   . Stroke Sister      Current Outpatient Prescriptions  Medication Sig Dispense Refill  . ACCU-CHEK SMARTVIEW test strip     .  cilostazol (PLETAL) 100 MG tablet Take 100 mg by mouth 2 (two) times daily.    . fenofibrate 160 MG tablet Take 160 mg by mouth daily.    Marland Kitchen glimepiride (AMARYL) 1 MG tablet Take 1 mg by mouth daily with breakfast.     . metFORMIN (GLUCOPHAGE) 500 MG tablet Take 1 tablet (500 mg total) by mouth 3 (three) times daily.    Marland Kitchen olmesartan (BENICAR) 40 MG tablet Take 20 mg by mouth daily.     . rivaroxaban (XARELTO) 10 MG TABS tablet Take 1 tablet (10 mg total) by mouth daily. 21 tablet 0  . rosuvastatin (CRESTOR) 40 MG tablet Take 20 mg by mouth daily. For cholesterol    . sennosides-docusate sodium (SENOKOT-S) 8.6-50 MG tablet Take 2 tablets by mouth daily. 30 tablet 1  . baclofen (LIORESAL) 10 MG tablet Take 1 tablet (10 mg total) by mouth 3  (three) times daily. As needed for muscle spasm (Patient not taking: Reported on 03/22/2015) 50 tablet 0  . CVS STOOL SOFTENER/LAXATIVE 8.6-50 MG per tablet     . HYDROcodone-acetaminophen (NORCO) 10-325 MG per tablet     . metFORMIN (GLUCOPHAGE-XR) 500 MG 24 hr tablet     . mupirocin ointment (BACTROBAN) 2 %     . oxyCODONE-acetaminophen (PERCOCET) 10-325 MG per tablet Take 1-2 tablets by mouth every 6 (six) hours as needed for pain. MAXIMUM TOTAL ACETAMINOPHEN DOSE IS 4000 MG PER DAY (Patient not taking: Reported on 03/22/2015) 75 tablet 0  . oxyCODONE-acetaminophen (PERCOCET/ROXICET) 5-325 MG per tablet     . valACYclovir (VALTREX) 1000 MG tablet Take 1 tablet (1,000 mg total) by mouth 3 (three) times daily. (Patient not taking: Reported on 12/01/2014) 21 tablet 0  . zolpidem (AMBIEN) 10 MG tablet      No current facility-administered medications for this visit.   Patient reports the Jesse Avila has been on hold since his surgery.     Allergies  Allergen Reactions  . Cialis [Tadalafil] Other (See Comments)    Headache     REVIEW OF SYSTEMS:  (Positives checked otherwise negative)  CARDIOVASCULAR:   '[ ]'$  chest pain,  '[ ]'$  chest pressure,  '[ ]'$  palpitations,  '[ ]'$  shortness of breath when laying flat,  '[ ]'$  shortness of breath with exertion,   '[x]'$  pain in feet when walking,  '[x]'$  pain in feet when laying flat, '[ ]'$  history of blood clot in veins (DVT),  '[ ]'$  history of phlebitis,  '[ ]'$  swelling in legs,  '[ ]'$  varicose veins  PULMONARY:   '[ ]'$  productive cough,  '[ ]'$  asthma,  '[ ]'$  wheezing  NEUROLOGIC:   '[ ]'$  weakness in arms or legs,  '[ ]'$  numbness in arms or legs,  '[ ]'$  difficulty speaking or slurred speech,  '[ ]'$  temporary loss of vision in one eye,  '[ ]'$  dizziness '[x]'$  s/p R CEA  HEMATOLOGIC:   '[ ]'$  bleeding problems,  '[ ]'$  problems with blood clotting too easily  MUSCULOSKEL:   '[x]'$  joint pain, '[x]'$  joint swelling  GASTROINTEST:   '[ ]'$  vomiting blood,  '[ ]'$  blood in stool      GENITOURINARY:   '[ ]'$  burning with urination,  '[ ]'$  blood in urine  PSYCHIATRIC:   '[ ]'$  history of major depression  INTEGUMENTARY:   '[ ]'$  rashes,  '[ ]'$  ulcers  CONSTITUTIONAL:   '[ ]'$  fever,  '[ ]'$  chills    Allergies  Allergen Reactions  . Cialis [Tadalafil] Other (  See Comments)    Headache    On ROS today: chronic L great toe numbness, no motor weakness in legs, severe cramping with short distance ambulation   Physical Examination  Filed Vitals:   03/22/15 1539  BP: 118/58  Pulse: 96  Temp: 97.5 F (36.4 C)  Resp: 16  Height: '6\' 1"'$  (1.854 m)  Weight: 213 lb (96.616 kg)  SpO2: 99%   Body mass index is 28.11 kg/(m^2).  General: A&O x 3, WDWN  Head: NCAT  Neck: R neck incision healed  Eyes: PERRLA, EOMI  ENT: hearing intact, oropharynx without exudate or erythema, nasopharynx without drainage  Pulmonary: Sym exp, good air movt, CTAB, no rales, rhonchi, & wheezing  Cardiac: RRR, Nl S1, S2, no Murmurs, rubs or gallops  Vascular: Vessel Right Left  Radial Palpable Palpable  Brachial Palpable Palpable  Carotid Palpable, without bruit Palpable, without bruit  Aorta Not palpable N/A  Femoral Palpable Palpable  Popliteal Not palpable Not palpable  PT Not Palpable Not Palpable  DP Not Palpable Not Palpable   Gastrointestinal: soft, NTND, no G/R, no HSM, no masses, no CVAT B  Musculoskeletal: M/S 5/5 throughout , Extremities without ischemic changes   Neurologic: Pain and light touch intact in extremities , Motor exam as listed above  Psychiatric: Judgment intact, Mood & affect appropriate for pt's clinical situation  Lymph : No Cervical, Axillary, or Inguinal lymphadenopathy    Non-Invasive Vascular Imaging ABI (Date: 03/22/2015)  R: 0 (0.61), DP: none, PT: none, TBI: 0  L: 0.55 (0.69), DP: mono, PT: none, TBI: 0.32   Medical Decision Making  IVAR DOMANGUE is a 64 y.o. male who presents with:  S/p B CIA PTA+S, RLE CLI, LLE IC   Patient has  had precipitous drop in R ABI.  He does not have threaten leg currently with intact motor and sensation in R leg, suggesting has intact peroneal blood flow in that leg.  Based on the patient's vascular studies and examination, I have offered the patient: aortogram, bilateral leg runoff, and possible right leg intervention.  This is scheduled with Dr. Scot Dock who is on call for the group. I discussed with the patient the nature of angiographic procedures, especially the limited patencies of any endovascular intervention.   The patient is aware of that the risks of an angiographic procedure include but are not limited to: bleeding, infection, access site complications, renal failure, embolization, rupture of vessel, dissection, possible need for emergent surgical intervention, possible need for surgical procedures to treat the patient's pathology, anaphylactic reaction to contrast, and stroke and death.   The patient is aware of the risks and agrees to proceed.  I discussed in depth with the patient the nature of atherosclerosis, and emphasized the importance of maximal medical management including strict control of blood pressure, blood glucose, and lipid levels, antiplatelet agents, obtaining regular exercise, and cessation of smoking.    The patient is aware that without maximal medical management the underlying atherosclerotic disease process will progress, limiting the benefit of any interventions. The patient is currently on a statin: Crestor. The patient is currently not on an anti-platelet: as he was previously on Xarelto.  Apparently, the patient did not restart his Xarelto after his surgery.  Thank you for allowing [place] to participate in this patient's care.   Adele Barthel, MD [facility] [place] Office: [phone] Pager: [phone]  03/22/2015, 4:04 PM

## 2015-03-23 ENCOUNTER — Encounter (HOSPITAL_COMMUNITY)
Admission: RE | Disposition: A | Discharge status: Home / Self Care | Payer: Self-pay | Source: Ambulatory Visit | Attending: Vascular Surgery

## 2015-03-23 ENCOUNTER — Ambulatory Visit (HOSPITAL_COMMUNITY)
Admission: RE | Admit: 2015-03-23 | Discharge: 2015-03-23 | Disposition: A | Discharge status: Home / Self Care | Payer: 59 | Source: Ambulatory Visit | Attending: Vascular Surgery | Admitting: Vascular Surgery

## 2015-03-23 DIAGNOSIS — E1151 Type 2 diabetes mellitus with diabetic peripheral angiopathy without gangrene: Secondary | ICD-10-CM | POA: Insufficient documentation

## 2015-03-23 DIAGNOSIS — E785 Hyperlipidemia, unspecified: Secondary | ICD-10-CM | POA: Insufficient documentation

## 2015-03-23 DIAGNOSIS — Z7902 Long term (current) use of antithrombotics/antiplatelets: Secondary | ICD-10-CM | POA: Diagnosis not present

## 2015-03-23 DIAGNOSIS — G4700 Insomnia, unspecified: Secondary | ICD-10-CM | POA: Diagnosis not present

## 2015-03-23 DIAGNOSIS — I6529 Occlusion and stenosis of unspecified carotid artery: Secondary | ICD-10-CM | POA: Insufficient documentation

## 2015-03-23 DIAGNOSIS — M109 Gout, unspecified: Secondary | ICD-10-CM | POA: Insufficient documentation

## 2015-03-23 DIAGNOSIS — Z95820 Peripheral vascular angioplasty status with implants and grafts: Secondary | ICD-10-CM | POA: Diagnosis not present

## 2015-03-23 DIAGNOSIS — Z96652 Presence of left artificial knee joint: Secondary | ICD-10-CM | POA: Insufficient documentation

## 2015-03-23 DIAGNOSIS — I998 Other disorder of circulatory system: Secondary | ICD-10-CM | POA: Diagnosis present

## 2015-03-23 DIAGNOSIS — Z8249 Family history of ischemic heart disease and other diseases of the circulatory system: Secondary | ICD-10-CM | POA: Insufficient documentation

## 2015-03-23 DIAGNOSIS — Z87891 Personal history of nicotine dependence: Secondary | ICD-10-CM | POA: Insufficient documentation

## 2015-03-23 DIAGNOSIS — Z86010 Personal history of colonic polyps: Secondary | ICD-10-CM | POA: Insufficient documentation

## 2015-03-23 DIAGNOSIS — Z7901 Long term (current) use of anticoagulants: Secondary | ICD-10-CM | POA: Insufficient documentation

## 2015-03-23 DIAGNOSIS — M179 Osteoarthritis of knee, unspecified: Secondary | ICD-10-CM | POA: Insufficient documentation

## 2015-03-23 DIAGNOSIS — I70213 Atherosclerosis of native arteries of extremities with intermittent claudication, bilateral legs: Secondary | ICD-10-CM | POA: Insufficient documentation

## 2015-03-23 DIAGNOSIS — Z8673 Personal history of transient ischemic attack (TIA), and cerebral infarction without residual deficits: Secondary | ICD-10-CM | POA: Insufficient documentation

## 2015-03-23 DIAGNOSIS — I739 Peripheral vascular disease, unspecified: Secondary | ICD-10-CM | POA: Diagnosis present

## 2015-03-23 DIAGNOSIS — I10 Essential (primary) hypertension: Secondary | ICD-10-CM | POA: Insufficient documentation

## 2015-03-23 DIAGNOSIS — I70229 Atherosclerosis of native arteries of extremities with rest pain, unspecified extremity: Secondary | ICD-10-CM | POA: Diagnosis present

## 2015-03-23 HISTORY — PX: LOWER EXTREMITY ANGIOGRAM: SHX5508

## 2015-03-23 HISTORY — PX: PERIPHERAL VASCULAR CATHETERIZATION: SHX172C

## 2015-03-23 LAB — GLUCOSE, CAPILLARY
Glucose-Capillary: 96 mg/dL (ref 65–99)
Glucose-Capillary: 101 mg/dL — ABNORMAL HIGH (ref 65–99)

## 2015-03-23 LAB — POCT I-STAT, CHEM 8
BUN: 20 mg/dL (ref 6–20)
CALCIUM ION: 1.27 mmol/L (ref 1.13–1.30)
CREATININE: 1.2 mg/dL (ref 0.61–1.24)
Chloride: 106 mmol/L (ref 101–111)
GLUCOSE: 123 mg/dL — AB (ref 65–99)
HCT: 34 % — ABNORMAL LOW (ref 39.0–52.0)
Hemoglobin: 11.6 g/dL — ABNORMAL LOW (ref 13.0–17.0)
Potassium: 3.9 mmol/L (ref 3.5–5.1)
Sodium: 141 mmol/L (ref 135–145)
TCO2: 22 mmol/L (ref 0–100)

## 2015-03-23 SURGERY — ABDOMINAL AORTOGRAM
Anesthesia: LOCAL

## 2015-03-23 MED ORDER — SODIUM CHLORIDE 0.9 % IV SOLN: 1.0000 mL/kg/h | INTRAVENOUS | Status: DC

## 2015-03-23 MED ORDER — FENTANYL CITRATE (PF) 100 MCG/2ML IJ SOLN
INTRAMUSCULAR | Status: DC | PRN
  Administered 2015-03-23: 50 ug via INTRAVENOUS

## 2015-03-23 MED ORDER — LIDOCAINE HCL (PF) 1 % IJ SOLN
INTRAMUSCULAR | Status: AC
  Filled 2015-03-23: qty 30

## 2015-03-23 MED ORDER — MIDAZOLAM HCL 2 MG/2ML IJ SOLN
INTRAMUSCULAR | Status: AC
  Filled 2015-03-23: qty 4

## 2015-03-23 MED ORDER — MIDAZOLAM HCL 2 MG/2ML IJ SOLN
INTRAMUSCULAR | Status: DC | PRN
  Administered 2015-03-23: 1 mg via INTRAVENOUS

## 2015-03-23 MED ORDER — FENTANYL CITRATE (PF) 100 MCG/2ML IJ SOLN
INTRAMUSCULAR | Status: AC
  Filled 2015-03-23: qty 4

## 2015-03-23 MED ORDER — HEPARIN (PORCINE) IN NACL 2-0.9 UNIT/ML-% IJ SOLN
INTRAMUSCULAR | Status: AC
  Filled 2015-03-23: qty 1000

## 2015-03-23 MED ORDER — LIDOCAINE HCL (PF) 1 % IJ SOLN
INTRAMUSCULAR | Status: DC | PRN
  Administered 2015-03-23: 12 mL

## 2015-03-23 MED ORDER — SODIUM CHLORIDE 0.9 % IV SOLN
INTRAVENOUS | Status: DC
  Administered 2015-03-23: 12:00:00 via INTRAVENOUS

## 2015-03-23 SURGICAL SUPPLY — 10 items
CATH ANGIO 5F PIGTAIL 65CM (CATHETERS) ×1 IMPLANT
COVER PRB 48X5XTLSCP FOLD TPE (BAG) IMPLANT
COVER PROBE 5X48 (BAG) ×3
KIT MICROINTRODUCER STIFF 5F (SHEATH) ×1 IMPLANT
KIT PV (KITS) ×3 IMPLANT
SHEATH PINNACLE 5F 10CM (SHEATH) ×1 IMPLANT
SYR MEDRAD MARK V 150ML (SYRINGE) ×3 IMPLANT
TRANSDUCER W/STOPCOCK (MISCELLANEOUS) ×3 IMPLANT
TRAY PV CATH (CUSTOM PROCEDURE TRAY) ×3 IMPLANT
WIRE HITORQ VERSACORE ST 145CM (WIRE) ×1 IMPLANT

## 2015-03-23 NOTE — Discharge Instructions (Signed)

## 2015-03-23 NOTE — Progress Notes (Signed)
Site area: Insurance underwriter Prior to Removal:  Level 0 Pressure Applied For: 20 min Manual:   yes Patient Status During Pull:  A/o no pain Post Pull Site:  Level 0 Post Pull Instructions Given:  Pt understands post sheath pull instructions Post Pull Pulses Present: doppler lt dp Dressing Applied:  tegaderm and a 4x4 Bedrest begins @ 15:15 Comments:Pt leaves ha in stable condition. Lt groin unremarkable. Dressing is CDI.

## 2015-03-23 NOTE — H&P (View-Only) (Signed)
Established Intermittent Claudication  History of Present Illness  Jesse Avila is a 64 y.o. (March 19, 1951) male who presents with chief complaint: R>L calf pain.  Pt s/p B CIA stenting (03/29/12) and R CIA PTA (05/10/12) both with Dr. Scot Dock.  The patient's symptoms have progressed.  The patient's symptoms are: severe cramping in both calves with even short distance ambulation.  This patient was previously compliant with the walking plan until his recent L partial knee replacement.  When he resumed ambulation, he had severe sx..  Pt denies any recent cardiac arrhythmias.  Past Medical History  Diagnosis Date  . Hyperlipidemia     takes Fenofibrate and Crestor daily  . Peripheral vascular disease   . DJD (degenerative joint disease) of knee   . Carotid artery occlusion   . Essential hypertension, benign     takes Benicar daily  . 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 Insomnia     takes Ambien nightly as needed  . Stroke     takes Plavix daily as well as Pletal  . Joint pain   . History of gout   . History of colon polyps     benign  . Nocturia   . Primary localized osteoarthritis of left knee 12/26/2014    Past Surgical History  Procedure Laterality Date  . Knee arthroscopy Right 11/2009  . Meniscus repair  11/2009  . Stents      PLACED IN ??BOTH LEGS   2013?  Marland Kitchen Endarterectomy Right 05/09/2014    Procedure: RIGHT CAROTID ENDARTERECTOMY WITH PATCH ANGIOPLASTY;  Surgeon: Conrad Jamesport, MD;  Location: [place];  Service: Vascular;  Laterality: Right;  . Abdominal aortagram N/A 03/29/2012    Procedure: ABDOMINAL Maxcine Ham;  Surgeon: Angelia Mould, MD;  Location: [facility] CATH LAB;  Service: Cardiovascular;  Laterality: N/A;  . Percutaneous stent intervention N/A 05/10/2012    Procedure: PERCUTANEOUS STENT INTERVENTION;  Surgeon: Angelia Mould, MD;  Location: [facility] CATH  LAB;  Service: Cardiovascular;  Laterality: N/A;  . Colonoscopy    . Partial knee arthroplasty Left 12/26/2014    Procedure: UNICOMPARTMENTAL KNEE;  Surgeon: Marchia Bond, MD;  Location: [place];  Service: Orthopedics;  Laterality: Left;    Social History   Social History  . Marital Status: Married    Spouse Name: N/A  . Number of Children: N/A  . Years of Education: N/A   Occupational History  . Not on file.   Social History Main Topics  . Smoking status: Former Smoker -- 0.00 packs/day for 0 years  . Smokeless tobacco: Never Used     Comment: quit smoking 4 yrs ago  . Alcohol Use: 0.0 oz/week    0 Standard drinks or equivalent per week     Comment: beer  . Drug Use: No  . Sexual Activity: No   Other Topics Concern  . Not on file   Social History Narrative    Family History  Problem Relation Age of Onset  . Diabetes Mother   . Hypertension Mother   . Heart disease Mother     Coronary Artery Bypass Graft  . Hyperlipidemia Mother   . Heart attack Mother   . Hypertension Father   . Hyperlipidemia Sister   . Stroke Sister      Current Outpatient Prescriptions  Medication Sig Dispense Refill  . ACCU-CHEK SMARTVIEW test strip     .  cilostazol (PLETAL) 100 MG tablet Take 100 mg by mouth 2 (two) times daily.    . fenofibrate 160 MG tablet Take 160 mg by mouth daily.    Marland Kitchen glimepiride (AMARYL) 1 MG tablet Take 1 mg by mouth daily with breakfast.     . metFORMIN (GLUCOPHAGE) 500 MG tablet Take 1 tablet (500 mg total) by mouth 3 (three) times daily.    Marland Kitchen olmesartan (BENICAR) 40 MG tablet Take 20 mg by mouth daily.     . rivaroxaban (XARELTO) 10 MG TABS tablet Take 1 tablet (10 mg total) by mouth daily. 21 tablet 0  . rosuvastatin (CRESTOR) 40 MG tablet Take 20 mg by mouth daily. For cholesterol    . sennosides-docusate sodium (SENOKOT-S) 8.6-50 MG tablet Take 2 tablets by mouth daily. 30 tablet 1  . baclofen (LIORESAL) 10 MG tablet Take 1 tablet (10 mg total) by mouth 3  (three) times daily. As needed for muscle spasm (Patient not taking: Reported on 03/22/2015) 50 tablet 0  . CVS STOOL SOFTENER/LAXATIVE 8.6-50 MG per tablet     . HYDROcodone-acetaminophen (NORCO) 10-325 MG per tablet     . metFORMIN (GLUCOPHAGE-XR) 500 MG 24 hr tablet     . mupirocin ointment (BACTROBAN) 2 %     . oxyCODONE-acetaminophen (PERCOCET) 10-325 MG per tablet Take 1-2 tablets by mouth every 6 (six) hours as needed for pain. MAXIMUM TOTAL ACETAMINOPHEN DOSE IS 4000 MG PER DAY (Patient not taking: Reported on 03/22/2015) 75 tablet 0  . oxyCODONE-acetaminophen (PERCOCET/ROXICET) 5-325 MG per tablet     . valACYclovir (VALTREX) 1000 MG tablet Take 1 tablet (1,000 mg total) by mouth 3 (three) times daily. (Patient not taking: Reported on 12/01/2014) 21 tablet 0  . zolpidem (AMBIEN) 10 MG tablet      No current facility-administered medications for this visit.   Patient reports the Louanna Raw has been on hold since his surgery.     Allergies  Allergen Reactions  . Cialis [Tadalafil] Other (See Comments)    Headache     REVIEW OF SYSTEMS:  (Positives checked otherwise negative)  CARDIOVASCULAR:   '[ ]'$  chest pain,  '[ ]'$  chest pressure,  '[ ]'$  palpitations,  '[ ]'$  shortness of breath when laying flat,  '[ ]'$  shortness of breath with exertion,   '[x]'$  pain in feet when walking,  '[x]'$  pain in feet when laying flat, '[ ]'$  history of blood clot in veins (DVT),  '[ ]'$  history of phlebitis,  '[ ]'$  swelling in legs,  '[ ]'$  varicose veins  PULMONARY:   '[ ]'$  productive cough,  '[ ]'$  asthma,  '[ ]'$  wheezing  NEUROLOGIC:   '[ ]'$  weakness in arms or legs,  '[ ]'$  numbness in arms or legs,  '[ ]'$  difficulty speaking or slurred speech,  '[ ]'$  temporary loss of vision in one eye,  '[ ]'$  dizziness '[x]'$  s/p R CEA  HEMATOLOGIC:   '[ ]'$  bleeding problems,  '[ ]'$  problems with blood clotting too easily  MUSCULOSKEL:   '[x]'$  joint pain, '[x]'$  joint swelling  GASTROINTEST:   '[ ]'$  vomiting blood,  '[ ]'$  blood in stool      GENITOURINARY:   '[ ]'$  burning with urination,  '[ ]'$  blood in urine  PSYCHIATRIC:   '[ ]'$  history of major depression  INTEGUMENTARY:   '[ ]'$  rashes,  '[ ]'$  ulcers  CONSTITUTIONAL:   '[ ]'$  fever,  '[ ]'$  chills    Allergies  Allergen Reactions  . Cialis [Tadalafil] Other (  See Comments)    Headache    On ROS today: chronic L great toe numbness, no motor weakness in legs, severe cramping with short distance ambulation   Physical Examination  Filed Vitals:   03/22/15 1539  BP: 118/58  Pulse: 96  Temp: 97.5 F (36.4 C)  Resp: 16  Height: '6\' 1"'$  (1.854 m)  Weight: 213 lb (96.616 kg)  SpO2: 99%   Body mass index is 28.11 kg/(m^2).  General: A&O x 3, WDWN  Head: NCAT  Neck: R neck incision healed  Eyes: PERRLA, EOMI  ENT: hearing intact, oropharynx without exudate or erythema, nasopharynx without drainage  Pulmonary: Sym exp, good air movt, CTAB, no rales, rhonchi, & wheezing  Cardiac: RRR, Nl S1, S2, no Murmurs, rubs or gallops  Vascular: Vessel Right Left  Radial Palpable Palpable  Brachial Palpable Palpable  Carotid Palpable, without bruit Palpable, without bruit  Aorta Not palpable N/A  Femoral Palpable Palpable  Popliteal Not palpable Not palpable  PT Not Palpable Not Palpable  DP Not Palpable Not Palpable   Gastrointestinal: soft, NTND, no G/R, no HSM, no masses, no CVAT B  Musculoskeletal: M/S 5/5 throughout , Extremities without ischemic changes   Neurologic: Pain and light touch intact in extremities , Motor exam as listed above  Psychiatric: Judgment intact, Mood & affect appropriate for pt's clinical situation  Lymph : No Cervical, Axillary, or Inguinal lymphadenopathy    Non-Invasive Vascular Imaging ABI (Date: 03/22/2015)  R: 0 (0.61), DP: none, PT: none, TBI: 0  L: 0.55 (0.69), DP: mono, PT: none, TBI: 0.32   Medical Decision Making  IRBIN FINES is a 64 y.o. male who presents with:  S/p B CIA PTA+S, RLE CLI, LLE IC   Patient has  had precipitous drop in R ABI.  He does not have threaten leg currently with intact motor and sensation in R leg, suggesting has intact peroneal blood flow in that leg.  Based on the patient's vascular studies and examination, I have offered the patient: aortogram, bilateral leg runoff, and possible right leg intervention.  This is scheduled with Dr. Scot Dock who is on call for the group. I discussed with the patient the nature of angiographic procedures, especially the limited patencies of any endovascular intervention.   The patient is aware of that the risks of an angiographic procedure include but are not limited to: bleeding, infection, access site complications, renal failure, embolization, rupture of vessel, dissection, possible need for emergent surgical intervention, possible need for surgical procedures to treat the patient's pathology, anaphylactic reaction to contrast, and stroke and death.   The patient is aware of the risks and agrees to proceed.  I discussed in depth with the patient the nature of atherosclerosis, and emphasized the importance of maximal medical management including strict control of blood pressure, blood glucose, and lipid levels, antiplatelet agents, obtaining regular exercise, and cessation of smoking.    The patient is aware that without maximal medical management the underlying atherosclerotic disease process will progress, limiting the benefit of any interventions. The patient is currently on a statin: Crestor. The patient is currently not on an anti-platelet: as he was previously on Xarelto.  Apparently, the patient did not restart his Xarelto after his surgery.  Thank you for allowing [place] to participate in this patient's care.   Adele Barthel, MD [facility] [place] Office: [phone] Pager: [phone]  03/22/2015, 4:04 PM

## 2015-03-23 NOTE — Interval H&P Note (Signed)
History and Physical Interval Note:  03/23/2015 1:36 PM  Jesse Avila  has presented today for surgery, with the diagnosis of pvd with bilateral lower extremity claudication  The various methods of treatment have been discussed with the patient and family. After consideration of risks, benefits and other options for treatment, the patient has consented to  Procedure(s): Abdominal Aortogram (N/A) as a surgical intervention .  The patient's history has been reviewed, patient examined, no change in status, stable for surgery.  I have reviewed the patient's chart and labs.  Questions were answered to the patient's satisfaction.     Deitra Mayo

## 2015-03-23 NOTE — Op Note (Signed)
   PATIENT: Jesse Avila   MRN: [id number] DOB: 1950/09/05    DATE OF PROCEDURE: 03/23/2015  INDICATIONS: Jesse Avila is a 64 y.o. male who presented with progressive bilateral lower extremity claudication. His symptoms were worse on the right than the left. He was evaluated by Dr. Adele Avila in the office and set up for an arteriogram. I have performed a previous iliac stents on this patient.  PROCEDURE:  1. Ultrasound-guided access to the left common femoral artery 2. Aortogram with bilateral iliac arteriogram and bilateral lower extremity runoff  SURGEON: Jesse Avila. Jesse Dock, MD, FACS  ANESTHESIA: local with sedation   EBL: minimal  TECHNIQUE: The patient was taken to the peripheral vascular lab and received 1 mg of Versed and 50 g of fentanyl. Both groins were prepped and draped in usual sterile fashion. Under ultrasound guidance, after the skin was anesthetized, the left common femoral artery was cannulated with a micropuncture needle and micropuncture sheath introduced over the wire. This was exchanged for a 5 French sheath over a versa core wire. The pigtail catheter was positioned at the L1 vertebral body and flush aortogram obtained. The catheter was positioned above the aortic bifurcation and an oblique iliac projection was obtained. Next bilateral lower extremity runoff films were obtained. The patient had an AV fistula in the left leg and in order to obtain better visualization I remove the pigtail catheter over a wire and shot one retrograde left femoral arteriogram. At the completion of the procedure, the patient was transferred to the holding area for removal of the sheath.  FINDINGS:  1. There is one renal artery on the right and 3 renal arteries on the left. No significant renal artery stenosis is identified. The infrarenal aorta is widely patent. Both common iliac artery stents are widely patent. The external iliac and hypogastric arteries are widely patent. 2. On the right  side, there is an eccentric plaque in the common femoral artery which involves the proximal deep femoral artery. There is a tandem lesion down in the proximal superficial femoral artery. There is severe diffuse disease of the superficial femoral artery. The popliteal artery has severe disease. The below-knee popliteal artery is patent with single-vessel runoff on the right via the perineal artery. 3. On the left side, there is plaque in the proximal deep femoral artery. The common femoral artery is patent. The superficial femoral artery is occluded at its origin with reconstitution of the below-knee pop to artery. The proximal anterior tibial, posterior tibial, and the arteries are patent. There is poor visualization distally. 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  CLINICAL NOTE: The patient has progressive claudication of both lower extremities. I plan on seeing him in the office to discuss eitherthe  common femoral artery endarterectomy with patch angioplasty versus also a right fem-tib below knee pop bypass. Symptoms are more significant on the right side. We will follow his symptoms on the left side if they progress he could potentially require a left femoropopliteal bypass. The fistula arising from his peroneal artery will have to be followed.  Jesse Mayo, MD, FACS [facility]  DATE OF DICTATION:   03/23/2015

## 2015-03-24 ENCOUNTER — Other Ambulatory Visit: Payer: Self-pay | Admitting: *Deleted

## 2015-03-24 DIAGNOSIS — I739 Peripheral vascular disease, unspecified: Secondary | ICD-10-CM

## 2015-03-24 DIAGNOSIS — Z01810 Encounter for preprocedural cardiovascular examination: Secondary | ICD-10-CM

## 2015-03-26 ENCOUNTER — Encounter (HOSPITAL_COMMUNITY): Payer: Self-pay | Admitting: Vascular Surgery

## 2015-03-27 ENCOUNTER — Telehealth: Payer: Self-pay | Admitting: Vascular Surgery

## 2015-03-27 NOTE — Telephone Encounter (Signed)
Spoke with pt, dpm °

## 2015-03-27 NOTE — Telephone Encounter (Signed)
-----   Message from Mena Goes, RN sent at 03/24/2015  6:12 PM EDT ----- Regarding: Schedule   ----- Message -----    From: Angelia Mould, MD    Sent: 03/23/2015   2:44 PM      To: [facility] Charge Pool Subject: charge and f/u                                 This patient had an ultrasound-guided access to the left common femoral artery, aortogram with bilateral iliac arteriogram and bilateral lower extremity runoff. I will need to see him in the office on Wednesday in the next 1-2 weeks. He will need a vein map of the right lower extremity in anticipation of possible right femoropopliteal bypass grafting. Thank you. CD

## 2015-04-02 DIAGNOSIS — Z0279 Encounter for issue of other medical certificate: Secondary | ICD-10-CM | POA: Diagnosis not present

## 2015-04-10 ENCOUNTER — Encounter: Payer: Self-pay | Admitting: Vascular Surgery

## 2015-04-11 ENCOUNTER — Encounter: Payer: Self-pay | Admitting: Vascular Surgery

## 2015-04-11 ENCOUNTER — Ambulatory Visit (HOSPITAL_COMMUNITY)
Admission: RE | Admit: 2015-04-11 | Discharge: 2015-04-11 | Disposition: A | Discharge status: Home / Self Care | Payer: 59 | Source: Ambulatory Visit | Attending: Vascular Surgery | Admitting: Vascular Surgery

## 2015-04-11 ENCOUNTER — Other Ambulatory Visit: Payer: Self-pay | Admitting: Vascular Surgery

## 2015-04-11 ENCOUNTER — Ambulatory Visit (INDEPENDENT_AMBULATORY_CARE_PROVIDER_SITE_OTHER): Payer: 59 | Admitting: Vascular Surgery

## 2015-04-11 VITALS — BP 125/70 | HR 112 | Temp 98.2°F | Resp 18 | Ht 73.0 in | Wt 215.0 lb

## 2015-04-11 DIAGNOSIS — E119 Type 2 diabetes mellitus without complications: Secondary | ICD-10-CM | POA: Diagnosis not present

## 2015-04-11 DIAGNOSIS — I10 Essential (primary) hypertension: Secondary | ICD-10-CM | POA: Diagnosis not present

## 2015-04-11 DIAGNOSIS — I70219 Atherosclerosis of native arteries of extremities with intermittent claudication, unspecified extremity: Secondary | ICD-10-CM | POA: Diagnosis not present

## 2015-04-11 DIAGNOSIS — Z01810 Encounter for preprocedural cardiovascular examination: Secondary | ICD-10-CM

## 2015-04-11 DIAGNOSIS — I739 Peripheral vascular disease, unspecified: Secondary | ICD-10-CM | POA: Insufficient documentation

## 2015-04-11 DIAGNOSIS — E785 Hyperlipidemia, unspecified: Secondary | ICD-10-CM | POA: Diagnosis not present

## 2015-04-11 NOTE — Progress Notes (Signed)
Established Intermittent Claudication  History of Present Illness  Jesse Avila is a 64 y.o. (26-Oct-1950) male who presents for continued discussion of his progressive bilateral lower extremity claudication, right greater than left. He underwent right lower extremity vein mapping today in our office. He recently underwent an arteriogram by Dr. Scot Dock on 03/23/15. On the right side, there was severe disease in the common femoral, proximal deep femoral, superficial femoral and popliteal arteries. The below-knee popliteal artery is patent with single vessel runoff via the peroneal artery.  He also inquires about his left extremity. His left claudication is not as severe as his right. He recently had to quit his job due to his symptoms and will only have health insurance for a limited time. He has previously tried a walking program that initially improved his symptoms. However, since resuming ambulation after his recent knee surgery (12/26/14), he has had difficulties with severe claudication.   The patient denies any rest pain or non healing wounds. He denies any chest pain or discomfort. He has never had cardiac surgery.   He has a history of bilateral common iliac stenting (03/29/12) and right common iliac angioplasty (05/10/12). Physical Examination  Filed Vitals:   04/11/15 1523  BP: 125/70  Pulse: 112  Temp: 98.2 F (36.8 C)  Resp: 18  Height: '6\' 1"'$  (1.854 m)  Weight: 215 lb (97.523 kg)  SpO2: 94%   Body mass index is 28.37 kg/(m^2).  General: A&O x 3, WDWN male in NAD  Pulmonary: Sym exp, good air movt, CTAB, no rales, rhonchi, & wheezing  Cardiac: RRR, Nl S1, S2, no Murmurs, rubs or gallops. No carotid bruits.   Vascular: Non palpable pedal pulses. Extremities without ischemic changes.   Musculoskeletal: No muscle wasting or atrophy.   Neurologic: Motor and sensory intact bilateral lower extremities.   Non-Invasive Vascular Imaging ABI (Date: 04/11/2015)  R: 0 (0.61), DP:  none, PT: none, TBI: 0  L: 0.55 (0.69), DP: mono, PT: none, TBI: 0.32  Vein Mapping (04/11/15)  Right GSV: 0.34 distal calf, 0.36 at knee, 0.52 at saphenofemoral junction  Medical Decision Making  Jesse Avila is a 64 y.o. male who presents with:  bilateral leg lifestyle limiting intermittent claudication without evidence of critical limb ischemia.  He has failed conservative therapy with a walking program. He is on maximal medical management with aspirin and a statin. He is not a smoker.  Recommend right common femoral endarterectomy with right femoral to below knee popliteal bypass.   The patient's vein map today reveals an adequate conduit for bypass.   The procedure, risks and benefits were discussed at length with the patient and he is willing to proceed.   The surgery will be scheduled for 05/08/15 with Dr. Callie Fielding, PA-C [facility] Office: [phone] Pager: [phone]  04/11/2015, 3:48 PM   This patient was seen in conjunction with Dr. Scot Dock.   Agree with above. The patient has disabling claudication of the right lower extremity and wishes to pursue revascularization. I have reviewed his arteriogram from 03/23/2015 which shows significant disease in his right common femoral artery and also proximal deep femoral artery. There is severe diffuse disease of the superficial femoral artery and reconstitution of the below-knee popliteal artery. He appears to have a reasonable vein on his vein map today. I have recommended that we proceed with right femoropopliteal bypass grafting given the progression of his symptoms. I have reviewed the indications for lower extremity bypass.  I have also reviewed the potential complications of surgery including but not limited to: wound healing problems, infection, graft thrombosis, limb loss, or other unpredictable medical problems. All the patient's questions were answered and they are agreeable to  proceed. His surgery is scheduled for October 4.   Deitra Mayo, MD, [place] [phone] Office: [phone]

## 2015-05-04 ENCOUNTER — Other Ambulatory Visit: Payer: Self-pay

## 2015-05-14 NOTE — Pre-Procedure Instructions (Addendum)
Jesse Avila  05/14/2015      [id number] [address] [place] [address]Phone: [phone]: [phone]   Your procedure is scheduled on Thurs, Oct 13 @ 7:30 AM  [facility]  Call this number if you have problems the morning of surgery:  [phone]  Remember:  Do not eat food or drink liquids after midnight.  Take these medicines the morning of surgery with A SIP OF WATER : Aspirin.            No Goody's,BC's,Aleve,Ibuprofen,Fish Oil,or any Herbal Medications.    How to Manage Your Diabetes Before Surgery   Why is it important to control my blood sugar before and after surgery?   Improving blood sugar levels before and after surgery helps healing and can limit problems.  A way of improving blood sugar control is eating a healthy diet by:  - Eating less sugar and carbohydrates  - Increasing activity/exercise  - Talk with your doctor about reaching your blood sugar goals  High blood sugars (greater than 180 mg/dL) can raise your risk of infections and slow down your recovery so you will need to focus on controlling your diabetes during the weeks before surgery.  Make sure that the doctor who takes care of your diabetes knows about your planned surgery including the date and location.  How do I manage my blood sugars before surgery?   Check your blood sugar at least 4 times a day, 2 days before surgery to make sure that they are not too high or low.   Check your blood sugar the morning of your surgery when you wake up and every 2               hours until you get to the Short-Stay unit.  If your blood sugar is less than 70 mg/dL, you will need to treat for low blood sugar by:  Treat a low blood sugar (less than 70 mg/dL) with 1/2 cup of clear juice (cranberry or apple), 4 glucose tablets, OR glucose gel.  Recheck blood sugar in 15 minutes after treatment (to  make sure it is greater than 70 mg/dL).  If blood sugar is not greater than 70 mg/dL on re-check, call [phone] further instructions.   Report your blood sugar to the Short-Stay nurse when you get to Short-Stay.  References:  [facility] 2007 "How to Manage your Diabetes Before and After Surgery".  What do I do about my diabetes medications?   Do not take oral diabetes medicines (pills) the morning of surgery.     Do not wear jewelry.  Do not wear lotions, powders, or colognes.  You may wear deodorant.  Men may shave face and neck.  Do not bring valuables to the hospital.  [facility] or [facility]ables.  Contacts, dentures or bridgework may not be worn into surgery.  Leave your suitcase in the car.  After surgery it may be brought to your room.  For patients admitted to the hospital, discharge time will be determined by your treatment team.  Patients discharged the day of surgery will not be allowed to drive home.    Special instructions:  [facility] - Preparing for Surgery  Before surgery, you can play an important role.  Because skin is not sterile, your skin needs to be as  free of germs as possible.  You can reduce the number of germs on you skin by washing with CHG (chlorahexidine gluconate) soap before surgery.  CHG is an antiseptic cleaner which kills germs and bonds with the skin to continue killing germs even after washing.  Please DO NOT use if you have an allergy to CHG or antibacterial soaps.  If your skin becomes reddened/irritated stop using the CHG and inform your nurse when you arrive at Short Stay.  Do not shave (including legs and underarms) for at least 48 hours prior to the first CHG shower.  You may shave your face.  Please follow these instructions carefully:   1.  Shower with CHG Soap the night before surgery and the                                morning of Surgery.  2.  If you choose to  wash your hair, wash your hair first as usual with your       normal shampoo.  3.  After you shampoo, rinse your hair and body thoroughly to remove the                      Shampoo.  4.  Use CHG as you would any other liquid soap.  You can apply chg directly       to the skin and wash gently with scrungie or a clean washcloth.  5.  Apply the CHG Soap to your body ONLY FROM THE NECK DOWN.        Do not use on open wounds or open sores.  Avoid contact with your eyes,       ears, mouth and genitals (private parts).  Wash genitals (private parts)       with your normal soap.  6.  Wash thoroughly, paying special attention to the area where your surgery        will be performed.  7.  Thoroughly rinse your body with warm water from the neck down.  8.  DO NOT shower/wash with your normal soap after using and rinsing off       the CHG Soap.  9.  Pat yourself dry with a clean towel.            10.  Wear clean pajamas.            11.  Place clean sheets on your bed the night of your first shower and do not        sleep with pets.  Day of Surgery  Do not apply any lotions/deoderants the morning of surgery.  Please wear clean clothes to the hospital/surgery center.    Please read over the following fact sheets that you were given. Pain Booklet, Coughing and Deep Breathing, Blood Transfusion Information, MRSA Information and Surgical Site Infection Prevention

## 2015-05-15 ENCOUNTER — Encounter (HOSPITAL_COMMUNITY): Payer: Self-pay

## 2015-05-15 ENCOUNTER — Other Ambulatory Visit: Payer: Self-pay

## 2015-05-15 ENCOUNTER — Encounter (HOSPITAL_COMMUNITY)
Admission: RE | Admit: 2015-05-15 | Discharge: 2015-05-15 | Disposition: A | Discharge status: Home / Self Care | Payer: 59 | Source: Ambulatory Visit | Attending: Vascular Surgery | Admitting: Vascular Surgery

## 2015-05-15 LAB — CBC
HCT: 34.9 % — ABNORMAL LOW (ref 39.0–52.0)
HEMOGLOBIN: 11.4 g/dL — AB (ref 13.0–17.0)
MCH: 30.5 pg (ref 26.0–34.0)
MCHC: 32.7 g/dL (ref 30.0–36.0)
MCV: 93.3 fL (ref 78.0–100.0)
Platelets: 160 10*3/uL (ref 150–400)
RBC: 3.74 MIL/uL — AB (ref 4.22–5.81)
RDW: 13.3 % (ref 11.5–15.5)
WBC: 5 10*3/uL (ref 4.0–10.5)

## 2015-05-15 LAB — URINALYSIS, ROUTINE W REFLEX MICROSCOPIC
Glucose, UA: 500 mg/dL — AB
Hgb urine dipstick: NEGATIVE
KETONES UR: NEGATIVE mg/dL
LEUKOCYTES UA: NEGATIVE
NITRITE: NEGATIVE
PROTEIN: NEGATIVE mg/dL
Specific Gravity, Urine: 1.027 (ref 1.005–1.030)
UROBILINOGEN UA: 0.2 mg/dL (ref 0.0–1.0)
pH: 5 (ref 5.0–8.0)

## 2015-05-15 LAB — COMPREHENSIVE METABOLIC PANEL
ALK PHOS: 63 U/L (ref 38–126)
ALT: 27 U/L (ref 17–63)
AST: 26 U/L (ref 15–41)
Albumin: 4 g/dL (ref 3.5–5.0)
Anion gap: 10 (ref 5–15)
BUN: 17 mg/dL (ref 6–20)
CALCIUM: 9.8 mg/dL (ref 8.9–10.3)
CHLORIDE: 101 mmol/L (ref 101–111)
CO2: 26 mmol/L (ref 22–32)
CREATININE: 1.23 mg/dL (ref 0.61–1.24)
GFR calc non Af Amer: >60 mL/min (ref >60)
GLUCOSE: 259 mg/dL — AB (ref 65–99)
Potassium: 3.8 mmol/L (ref 3.5–5.1)
SODIUM: 137 mmol/L (ref 135–145)
Total Bilirubin: 0.4 mg/dL (ref 0.3–1.2)
Total Protein: 7 g/dL (ref 6.5–8.1)

## 2015-05-15 LAB — PROTIME-INR
INR: 1.02 (ref 0.00–1.49)
Prothrombin Time: 13.6 seconds (ref 11.6–15.2)

## 2015-05-15 LAB — APTT: aPTT: 27 seconds (ref 24–37)

## 2015-05-15 LAB — SURGICAL PCR SCREEN
MRSA, PCR: NEGATIVE
STAPHYLOCOCCUS AUREUS: NEGATIVE

## 2015-05-15 LAB — GLUCOSE, CAPILLARY: Glucose-Capillary: 196 mg/dL — ABNORMAL HIGH (ref 65–99)

## 2015-05-15 NOTE — Progress Notes (Signed)
PCP: Dr. Marisue Humble No cardiologist. No endocrindologist States fasting blood sugars run 180's.

## 2015-05-16 LAB — HEMOGLOBIN A1C
HEMOGLOBIN A1C: 7.5 % — AB (ref 4.8–5.6)
MEAN PLASMA GLUCOSE: 169 mg/dL

## 2015-05-16 MED ORDER — CHLORHEXIDINE GLUCONATE CLOTH 2 % EX PADS: 6.0000 | MEDICATED_PAD | Freq: Once | CUTANEOUS | Status: DC

## 2015-05-16 MED ORDER — SODIUM CHLORIDE 0.9 % IV SOLN
INTRAVENOUS | Status: DC
Start: 2015-05-17 — End: 2015-05-17

## 2015-05-16 MED ORDER — CEFUROXIME SODIUM 1.5 G IJ SOLR
1.5000 g | INTRAMUSCULAR | Status: AC
  Administered 2015-05-17 (×2): 1.5 g via INTRAVENOUS
  Filled 2015-05-16: qty 1.5

## 2015-05-16 NOTE — Progress Notes (Signed)
Anesthesia Chart Review: Patient is a 64 year old male scheduled for right femoral endarterectomy, right FPBG on 05/17/15 by Dr. Scot Dock.  History includes former smoker, CVA 04/12/14 s/p right CEA 05/09/14, HLD, DM2, HTN, ED, DJD, arthritis, gout, PAD s/p bilateral CIA stents 03/29/12, left unicompartmental knee 12/26/14. PCP is Dr. Marisue Humble. He is not followed routinely by a cardiologist but was seen by Dr. Domenic Polite in 2015 for a preoperative evaluation prior to carotid surgery.   Nuclear stress test on 05/03/14 showed:  1. Inferior/inferior septal defect as outlined above, suspect a component of diaphragmatic attenuation, although cannot exclude mild ischemia in the mid to apical inferoseptal wall.  2. Gated imaging suggest global hypokinesis.  3. Left ventricular ejection fraction of 39%. Recent echocardiogram however reported LVEF of 55-60% with normal wall motion.  4. Excluding LVEF findings, overall low-risk stress test findings*. Study would be consistent with underlying coronary atherosclerosis, although without high ischemic burden.  *2012 Appropriate Use Criteria for Coronary Revascularization  At that time, Dr. Domenic Polite wrote, "Overall low risk study, consistent with underlying coronary atherosclerosis which was suspected based on risk factors. He should be able to go ahead with CEA at an acceptable perioperative cardiac risk."  Echo on 04/15/14 showed:  - Left ventricle: The cavity size was normal. Wall thickness was normal. Systolic function was normal. The estimated ejection fraction was in the range of 55% to 60%. Wall motion was normal; there were no regional wall motion abnormalities. Doppler parameters are consistent with abnormal left ventricular relaxation (grade 1 diastolic dysfunction). - Left atrium: The atrium was mildly dilated. Impressions: No cardiac source of emboli was indentified.  10/11/6 EKG: NSR, anteroseptal infarct (age undetermined). Since last tracing lead  placement may have changed. (Poor r wave progression new in V4.)  12/01/14 Carotid dopplers: patent right CEA with minimal hyperplasia at the proximal patch site and surgical bulb. LICA 62-37% stenosis.  Preoperative labs noted.Non-fasting glucose 259, A1C 7.5.   Patient tolerated CEA and uni-knee within the past 12-13 months. If no acute changes then I would anticipate that he could proceed as planned.  George Hugh [facility] Short Stay Center/Anesthesiology Phone [phone] 05/16/2015 11:40 AM

## 2015-05-17 ENCOUNTER — Encounter (HOSPITAL_COMMUNITY): Payer: Self-pay

## 2015-05-17 ENCOUNTER — Inpatient Hospital Stay (HOSPITAL_COMMUNITY): Payer: 59 | Admitting: Vascular Surgery

## 2015-05-17 ENCOUNTER — Inpatient Hospital Stay (HOSPITAL_COMMUNITY): Payer: 59 | Admitting: Anesthesiology

## 2015-05-17 ENCOUNTER — Encounter (HOSPITAL_COMMUNITY)
Admission: RE | Disposition: A | Discharge status: Home Health | Payer: Self-pay | Source: Ambulatory Visit | Attending: Vascular Surgery

## 2015-05-17 ENCOUNTER — Inpatient Hospital Stay (HOSPITAL_COMMUNITY)
Admission: RE | Admit: 2015-05-17 | Discharge: 2015-05-20 | DRG: 253 | Disposition: A | Discharge status: Home Health | Payer: 59 | Source: Ambulatory Visit | Attending: Vascular Surgery | Admitting: Vascular Surgery

## 2015-05-17 DIAGNOSIS — E785 Hyperlipidemia, unspecified: Secondary | ICD-10-CM | POA: Diagnosis present

## 2015-05-17 DIAGNOSIS — I70213 Atherosclerosis of native arteries of extremities with intermittent claudication, bilateral legs: Secondary | ICD-10-CM | POA: Diagnosis present

## 2015-05-17 DIAGNOSIS — Z79899 Other long term (current) drug therapy: Secondary | ICD-10-CM | POA: Diagnosis not present

## 2015-05-17 DIAGNOSIS — Z7902 Long term (current) use of antithrombotics/antiplatelets: Secondary | ICD-10-CM | POA: Diagnosis not present

## 2015-05-17 DIAGNOSIS — R000 Tachycardia, unspecified: Secondary | ICD-10-CM | POA: Diagnosis not present

## 2015-05-17 DIAGNOSIS — E1151 Type 2 diabetes mellitus with diabetic peripheral angiopathy without gangrene: Secondary | ICD-10-CM | POA: Diagnosis present

## 2015-05-17 DIAGNOSIS — M109 Gout, unspecified: Secondary | ICD-10-CM | POA: Diagnosis present

## 2015-05-17 DIAGNOSIS — I10 Essential (primary) hypertension: Secondary | ICD-10-CM | POA: Diagnosis present

## 2015-05-17 DIAGNOSIS — M1712 Unilateral primary osteoarthritis, left knee: Secondary | ICD-10-CM | POA: Diagnosis present

## 2015-05-17 DIAGNOSIS — I739 Peripheral vascular disease, unspecified: Secondary | ICD-10-CM | POA: Diagnosis present

## 2015-05-17 DIAGNOSIS — Z95828 Presence of other vascular implants and grafts: Secondary | ICD-10-CM

## 2015-05-17 DIAGNOSIS — D62 Acute posthemorrhagic anemia: Secondary | ICD-10-CM | POA: Diagnosis not present

## 2015-05-17 DIAGNOSIS — I70421 Atherosclerosis of autologous vein bypass graft(s) of the extremities with rest pain, right leg: Secondary | ICD-10-CM | POA: Diagnosis not present

## 2015-05-17 HISTORY — PX: PATCH ANGIOPLASTY: SHX6230

## 2015-05-17 HISTORY — PX: ENDARTERECTOMY FEMORAL: SHX5804

## 2015-05-17 HISTORY — PX: FEMORAL-POPLITEAL BYPASS GRAFT: SHX937

## 2015-05-17 LAB — CBC
HCT: 24.8 % — ABNORMAL LOW (ref 39.0–52.0)
HEMATOCRIT: 26.7 % — AB (ref 39.0–52.0)
HEMOGLOBIN: 8.7 g/dL — AB (ref 13.0–17.0)
HEMOGLOBIN: 8.3 g/dL — AB (ref 13.0–17.0)
MCH: 30.7 pg (ref 26.0–34.0)
MCH: 31.6 pg (ref 26.0–34.0)
MCHC: 32.6 g/dL (ref 30.0–36.0)
MCHC: 33.5 g/dL (ref 30.0–36.0)
MCV: 94.3 fL (ref 78.0–100.0)
MCV: 94.3 fL (ref 78.0–100.0)
PLATELETS: 116 10*3/uL — AB (ref 150–400)
Platelets: 128 10*3/uL — ABNORMAL LOW (ref 150–400)
RBC: 2.83 MIL/uL — ABNORMAL LOW (ref 4.22–5.81)
RBC: 2.63 MIL/uL — AB (ref 4.22–5.81)
RDW: 13.6 % (ref 11.5–15.5)
RDW: 13.7 % (ref 11.5–15.5)
WBC: 7.1 10*3/uL (ref 4.0–10.5)
WBC: 6.4 10*3/uL (ref 4.0–10.5)

## 2015-05-17 LAB — GLUCOSE, CAPILLARY
Glucose-Capillary: 167 mg/dL — ABNORMAL HIGH (ref 65–99)
Glucose-Capillary: 168 mg/dL — ABNORMAL HIGH (ref 65–99)
Glucose-Capillary: 167 mg/dL — ABNORMAL HIGH (ref 65–99)

## 2015-05-17 LAB — CREATININE, SERUM
CREATININE: 1.31 mg/dL — AB (ref 0.61–1.24)
GFR calc Af Amer: >60 mL/min (ref >60)
GFR calc non Af Amer: 57 mL/min — ABNORMAL LOW (ref >60)

## 2015-05-17 SURGERY — ENDARTERECTOMY, FEMORAL
Anesthesia: General | Site: Groin | Laterality: Right

## 2015-05-17 MED ORDER — NEOSTIGMINE METHYLSULFATE 10 MG/10ML IV SOLN
INTRAVENOUS | Status: AC
  Filled 2015-05-17: qty 1

## 2015-05-17 MED ORDER — PROPOFOL 10 MG/ML IV BOLUS
INTRAVENOUS | Status: DC | PRN
  Administered 2015-05-17: 30 mg via INTRAVENOUS
  Administered 2015-05-17: 170 mg via INTRAVENOUS

## 2015-05-17 MED ORDER — ROSUVASTATIN CALCIUM 20 MG PO TABS
20.0000 mg | ORAL_TABLET | Freq: Every day | ORAL | Status: DC
  Administered 2015-05-18 – 2015-05-19 (×2): 20 mg via ORAL
  Filled 2015-05-17 (×3): qty 1

## 2015-05-17 MED ORDER — SODIUM CHLORIDE 0.9 % IJ SOLN
INTRAMUSCULAR | Status: AC
  Filled 2015-05-17: qty 10

## 2015-05-17 MED ORDER — METOPROLOL TARTRATE 1 MG/ML IV SOLN: 2.0000 mg | INTRAVENOUS | Status: DC | PRN

## 2015-05-17 MED ORDER — MIDAZOLAM HCL 2 MG/2ML IJ SOLN: 0.5000 mg | Freq: Once | INTRAMUSCULAR | Status: DC | PRN

## 2015-05-17 MED ORDER — LIDOCAINE HCL (CARDIAC) 20 MG/ML IV SOLN
INTRAVENOUS | Status: AC
  Filled 2015-05-17: qty 5

## 2015-05-17 MED ORDER — LACTATED RINGERS IV SOLN
INTRAVENOUS | Status: DC | PRN
  Administered 2015-05-17 (×3): via INTRAVENOUS

## 2015-05-17 MED ORDER — PROPOFOL 10 MG/ML IV BOLUS
INTRAVENOUS | Status: AC
  Filled 2015-05-17: qty 20

## 2015-05-17 MED ORDER — ALBUMIN HUMAN 5 % IV SOLN
INTRAVENOUS | Status: DC | PRN
  Administered 2015-05-17 (×2): via INTRAVENOUS

## 2015-05-17 MED ORDER — INSULIN ASPART 100 UNIT/ML ~~LOC~~ SOLN
0.0000 [IU] | Freq: Three times a day (TID) | SUBCUTANEOUS | Status: DC
  Administered 2015-05-18 (×3): 5 [IU] via SUBCUTANEOUS
  Administered 2015-05-19 – 2015-05-20 (×3): 3 [IU] via SUBCUTANEOUS

## 2015-05-17 MED ORDER — ONDANSETRON HCL 4 MG/2ML IJ SOLN
INTRAMUSCULAR | Status: DC | PRN
  Administered 2015-05-17: 4 mg via INTRAVENOUS

## 2015-05-17 MED ORDER — HEMOSTATIC AGENTS (NO CHARGE) OPTIME
TOPICAL | Status: DC | PRN
  Administered 2015-05-17: 1 via TOPICAL

## 2015-05-17 MED ORDER — ONDANSETRON HCL 4 MG/2ML IJ SOLN: 4.0000 mg | Freq: Four times a day (QID) | INTRAMUSCULAR | Status: DC | PRN

## 2015-05-17 MED ORDER — MORPHINE SULFATE (PF) 2 MG/ML IV SOLN: 2.0000 mg | INTRAVENOUS | Status: DC | PRN

## 2015-05-17 MED ORDER — METFORMIN HCL 500 MG PO TABS
500.0000 mg | ORAL_TABLET | Freq: Three times a day (TID) | ORAL | Status: DC
Start: 2015-05-18 — End: 2015-05-20
  Administered 2015-05-18 – 2015-05-20 (×7): 500 mg via ORAL
  Filled 2015-05-17 (×12): qty 1

## 2015-05-17 MED ORDER — MEPERIDINE HCL 25 MG/ML IJ SOLN: 6.2500 mg | INTRAMUSCULAR | Status: DC | PRN

## 2015-05-17 MED ORDER — 0.9 % SODIUM CHLORIDE (POUR BTL) OPTIME
TOPICAL | Status: DC | PRN
  Administered 2015-05-17: 1000 mL

## 2015-05-17 MED ORDER — ASPIRIN EC 81 MG PO TBEC
81.0000 mg | DELAYED_RELEASE_TABLET | Freq: Every day | ORAL | Status: DC
  Administered 2015-05-18 – 2015-05-20 (×3): 81 mg via ORAL
  Filled 2015-05-17 (×3): qty 1

## 2015-05-17 MED ORDER — SODIUM CHLORIDE 0.9 % IV SOLN: 500.0000 mL | Freq: Once | INTRAVENOUS | Status: DC | PRN

## 2015-05-17 MED ORDER — PHENYLEPHRINE HCL 10 MG/ML IJ SOLN
INTRAMUSCULAR | Status: DC | PRN
  Administered 2015-05-17: 80 ug via INTRAVENOUS
  Administered 2015-05-17: 40 ug via INTRAVENOUS
  Administered 2015-05-17: 80 ug via INTRAVENOUS
  Administered 2015-05-17: 40 ug via INTRAVENOUS
  Administered 2015-05-17 (×2): 80 ug via INTRAVENOUS
  Administered 2015-05-17: 40 ug via INTRAVENOUS
  Administered 2015-05-17: 80 ug via INTRAVENOUS

## 2015-05-17 MED ORDER — LIDOCAINE HCL (CARDIAC) 20 MG/ML IV SOLN
INTRAVENOUS | Status: DC | PRN
  Administered 2015-05-17: 80 mg via INTRAVENOUS
  Administered 2015-05-17: 20 mg via INTRAVENOUS

## 2015-05-17 MED ORDER — PHENYLEPHRINE 40 MCG/ML (10ML) SYRINGE FOR IV PUSH (FOR BLOOD PRESSURE SUPPORT)
PREFILLED_SYRINGE | INTRAVENOUS | Status: AC
  Filled 2015-05-17: qty 10

## 2015-05-17 MED ORDER — DOCUSATE SODIUM 100 MG PO CAPS
100.0000 mg | ORAL_CAPSULE | Freq: Every day | ORAL | Status: DC
  Administered 2015-05-18 – 2015-05-20 (×3): 100 mg via ORAL
  Filled 2015-05-17 (×3): qty 1

## 2015-05-17 MED ORDER — GLYCOPYRROLATE 0.2 MG/ML IJ SOLN
INTRAMUSCULAR | Status: AC
  Filled 2015-05-17: qty 3

## 2015-05-17 MED ORDER — ACETAMINOPHEN 500 MG PO TABS
1000.0000 mg | ORAL_TABLET | Freq: Three times a day (TID) | ORAL | Status: DC | PRN
  Administered 2015-05-18: 500 mg via ORAL
  Administered 2015-05-18: 1000 mg via ORAL
  Filled 2015-05-17 (×3): qty 2

## 2015-05-17 MED ORDER — SUCCINYLCHOLINE CHLORIDE 20 MG/ML IJ SOLN
INTRAMUSCULAR | Status: AC
  Filled 2015-05-17: qty 1

## 2015-05-17 MED ORDER — NEOSTIGMINE METHYLSULFATE 10 MG/10ML IV SOLN
INTRAVENOUS | Status: DC | PRN
  Administered 2015-05-17: 4 mg via INTRAVENOUS

## 2015-05-17 MED ORDER — FENOFIBRATE 160 MG PO TABS
160.0000 mg | ORAL_TABLET | Freq: Every day | ORAL | Status: DC
  Administered 2015-05-18 – 2015-05-20 (×3): 160 mg via ORAL
  Filled 2015-05-17 (×3): qty 1

## 2015-05-17 MED ORDER — EPHEDRINE SULFATE 50 MG/ML IJ SOLN
INTRAMUSCULAR | Status: DC | PRN
  Administered 2015-05-17: 10 mg via INTRAVENOUS

## 2015-05-17 MED ORDER — HYDRALAZINE HCL 20 MG/ML IJ SOLN: 5.0000 mg | INTRAMUSCULAR | Status: DC | PRN

## 2015-05-17 MED ORDER — ENOXAPARIN SODIUM 40 MG/0.4ML ~~LOC~~ SOLN
40.0000 mg | SUBCUTANEOUS | Status: DC
  Administered 2015-05-18 – 2015-05-20 (×3): 40 mg via SUBCUTANEOUS
  Filled 2015-05-17 (×4): qty 0.4

## 2015-05-17 MED ORDER — ARTIFICIAL TEARS OP OINT
TOPICAL_OINTMENT | OPHTHALMIC | Status: AC
  Filled 2015-05-17: qty 3.5

## 2015-05-17 MED ORDER — MIDAZOLAM HCL 2 MG/2ML IJ SOLN
INTRAMUSCULAR | Status: AC
  Filled 2015-05-17: qty 4

## 2015-05-17 MED ORDER — ARTIFICIAL TEARS OP OINT
TOPICAL_OINTMENT | OPHTHALMIC | Status: DC | PRN
  Administered 2015-05-17: 1 via OPHTHALMIC

## 2015-05-17 MED ORDER — FENTANYL CITRATE (PF) 250 MCG/5ML IJ SOLN
INTRAMUSCULAR | Status: AC
  Filled 2015-05-17: qty 5

## 2015-05-17 MED ORDER — PAPAVERINE HCL 30 MG/ML IJ SOLN
INTRAMUSCULAR | Status: DC | PRN
  Administered 2015-05-17: 60 mg via INTRAVENOUS

## 2015-05-17 MED ORDER — HEPARIN SODIUM (PORCINE) 1000 UNIT/ML IJ SOLN
INTRAMUSCULAR | Status: DC | PRN
  Administered 2015-05-17: 2000 [IU] via INTRAVENOUS
  Administered 2015-05-17: 9000 [IU] via INTRAVENOUS

## 2015-05-17 MED ORDER — MAGNESIUM HYDROXIDE 400 MG/5ML PO SUSP: 30.0000 mL | Freq: Every day | ORAL | Status: DC | PRN

## 2015-05-17 MED ORDER — HYDROMORPHONE HCL 1 MG/ML IJ SOLN
INTRAMUSCULAR | Status: AC
  Filled 2015-05-17: qty 1

## 2015-05-17 MED ORDER — SODIUM CHLORIDE 0.9 % IV SOLN
INTRAVENOUS | Status: DC | PRN
  Administered 2015-05-17: 500 mL

## 2015-05-17 MED ORDER — PHENOL 1.4 % MT LIQD: 1.0000 | OROMUCOSAL | Status: DC | PRN

## 2015-05-17 MED ORDER — ALUM & MAG HYDROXIDE-SIMETH 200-200-20 MG/5ML PO SUSP: 15.0000 mL | ORAL | Status: DC | PRN

## 2015-05-17 MED ORDER — MIDAZOLAM HCL 5 MG/5ML IJ SOLN
INTRAMUSCULAR | Status: DC | PRN
  Administered 2015-05-17 (×2): 1 mg via INTRAVENOUS

## 2015-05-17 MED ORDER — PAPAVERINE HCL 30 MG/ML IJ SOLN
INTRAMUSCULAR | Status: AC
  Filled 2015-05-17: qty 2

## 2015-05-17 MED ORDER — DEXTROSE 5 % IV SOLN
1.5000 g | INTRAVENOUS | Status: DC
  Filled 2015-05-17: qty 1.5

## 2015-05-17 MED ORDER — ZOLPIDEM TARTRATE 5 MG PO TABS: 10.0000 mg | ORAL_TABLET | Freq: Every evening | ORAL | Status: DC | PRN

## 2015-05-17 MED ORDER — ONDANSETRON HCL 4 MG/2ML IJ SOLN
INTRAMUSCULAR | Status: AC
  Filled 2015-05-17: qty 2

## 2015-05-17 MED ORDER — GLYCOPYRROLATE 0.2 MG/ML IJ SOLN
INTRAMUSCULAR | Status: DC | PRN
  Administered 2015-05-17: 0.6 mg via INTRAVENOUS

## 2015-05-17 MED ORDER — POTASSIUM CHLORIDE CRYS ER 20 MEQ PO TBCR: 20.0000 meq | EXTENDED_RELEASE_TABLET | Freq: Every day | ORAL | Status: DC | PRN

## 2015-05-17 MED ORDER — OXYCODONE HCL 5 MG PO TABS
5.0000 mg | ORAL_TABLET | ORAL | Status: DC | PRN
  Administered 2015-05-18: 10 mg via ORAL
  Administered 2015-05-18: 5 mg via ORAL
  Administered 2015-05-18: 10 mg via ORAL
  Administered 2015-05-18: 5 mg via ORAL
  Filled 2015-05-17 (×2): qty 2
  Filled 2015-05-17: qty 1
  Filled 2015-05-17: qty 2

## 2015-05-17 MED ORDER — PROMETHAZINE HCL 25 MG/ML IJ SOLN: 6.2500 mg | INTRAMUSCULAR | Status: DC | PRN

## 2015-05-17 MED ORDER — ROCURONIUM BROMIDE 50 MG/5ML IV SOLN
INTRAVENOUS | Status: AC
  Filled 2015-05-17: qty 1

## 2015-05-17 MED ORDER — DEXTROSE 5 % IV SOLN
1.5000 g | Freq: Two times a day (BID) | INTRAVENOUS | Status: AC
  Administered 2015-05-18 (×2): 1.5 g via INTRAVENOUS
  Filled 2015-05-17 (×2): qty 1.5

## 2015-05-17 MED ORDER — HYDROMORPHONE HCL 1 MG/ML IJ SOLN
0.2500 mg | INTRAMUSCULAR | Status: DC | PRN
  Administered 2015-05-17: 0.25 mg via INTRAVENOUS
  Administered 2015-05-17 (×2): 0.5 mg via INTRAVENOUS
  Administered 2015-05-17: 0.25 mg via INTRAVENOUS

## 2015-05-17 MED ORDER — BISACODYL 10 MG RE SUPP: 10.0000 mg | Freq: Every day | RECTAL | Status: DC | PRN

## 2015-05-17 MED ORDER — FENTANYL CITRATE (PF) 100 MCG/2ML IJ SOLN
INTRAMUSCULAR | Status: DC | PRN
  Administered 2015-05-17 (×2): 50 ug via INTRAVENOUS
  Administered 2015-05-17: 250 ug via INTRAVENOUS
  Administered 2015-05-17 (×3): 50 ug via INTRAVENOUS

## 2015-05-17 MED ORDER — SODIUM CHLORIDE 0.9 % IV SOLN
INTRAVENOUS | Status: DC
  Administered 2015-05-17: 22:00:00 via INTRAVENOUS

## 2015-05-17 MED ORDER — PROTAMINE SULFATE 10 MG/ML IV SOLN
INTRAVENOUS | Status: DC | PRN
  Administered 2015-05-17: 50 mg via INTRAVENOUS

## 2015-05-17 MED ORDER — LABETALOL HCL 5 MG/ML IV SOLN: 10.0000 mg | INTRAVENOUS | Status: DC | PRN

## 2015-05-17 MED ORDER — PANTOPRAZOLE SODIUM 40 MG PO TBEC
40.0000 mg | DELAYED_RELEASE_TABLET | Freq: Every day | ORAL | Status: DC
  Administered 2015-05-18 – 2015-05-20 (×3): 40 mg via ORAL
  Filled 2015-05-17 (×2): qty 1

## 2015-05-17 MED ORDER — GUAIFENESIN-DM 100-10 MG/5ML PO SYRP: 15.0000 mL | ORAL_SOLUTION | ORAL | Status: DC | PRN

## 2015-05-17 MED ORDER — THROMBIN 20000 UNITS EX SOLR
CUTANEOUS | Status: AC
  Filled 2015-05-17: qty 20000

## 2015-05-17 MED ORDER — ROCURONIUM BROMIDE 100 MG/10ML IV SOLN
INTRAVENOUS | Status: DC | PRN
  Administered 2015-05-17: 50 mg via INTRAVENOUS
  Administered 2015-05-17 (×5): 10 mg via INTRAVENOUS

## 2015-05-17 MED ORDER — EPHEDRINE SULFATE 50 MG/ML IJ SOLN
INTRAMUSCULAR | Status: AC
  Filled 2015-05-17: qty 1

## 2015-05-17 MED ORDER — PHENYLEPHRINE HCL 10 MG/ML IJ SOLN
10.0000 mg | INTRAVENOUS | Status: DC | PRN
  Administered 2015-05-17: 10 ug/min via INTRAVENOUS

## 2015-05-17 MED ORDER — IRBESARTAN 150 MG PO TABS
150.0000 mg | ORAL_TABLET | Freq: Every day | ORAL | Status: DC
  Administered 2015-05-18 – 2015-05-20 (×3): 150 mg via ORAL
  Filled 2015-05-17 (×3): qty 1

## 2015-05-17 MED ORDER — GLIMEPIRIDE 1 MG PO TABS
1.0000 mg | ORAL_TABLET | Freq: Every day | ORAL | Status: DC
  Administered 2015-05-18 – 2015-05-20 (×3): 1 mg via ORAL
  Filled 2015-05-17 (×4): qty 1

## 2015-05-17 SURGICAL SUPPLY — 81 items
BANDAGE ELASTIC 4 VELCRO ST LF (GAUZE/BANDAGES/DRESSINGS) IMPLANT
BANDAGE ELASTIC 6 VELCRO ST LF (GAUZE/BANDAGES/DRESSINGS) ×6 IMPLANT
BANDAGE ESMARK 6X9 LF (GAUZE/BANDAGES/DRESSINGS) IMPLANT
BNDG CMPR 9X6 STRL LF SNTH (GAUZE/BANDAGES/DRESSINGS) ×2
BNDG ESMARK 6X9 LF (GAUZE/BANDAGES/DRESSINGS) ×4
CANISTER SUCTION 2500CC (MISCELLANEOUS) ×4 IMPLANT
CANNULA VESSEL 3MM 2 BLNT TIP (CANNULA) ×10 IMPLANT
CATH EMB 4FR 80CM (CATHETERS) ×2 IMPLANT
CLIP TI MEDIUM 24 (CLIP) ×4 IMPLANT
CLIP TI WIDE RED SMALL 24 (CLIP) ×6 IMPLANT
CUFF TOURNIQUET SINGLE 24IN (TOURNIQUET CUFF) IMPLANT
CUFF TOURNIQUET SINGLE 34IN LL (TOURNIQUET CUFF) ×2 IMPLANT
CUFF TOURNIQUET SINGLE 44IN (TOURNIQUET CUFF) IMPLANT
DRAIN CHANNEL 15F RND FF W/TCR (WOUND CARE) IMPLANT
DRAPE PROXIMA HALF (DRAPES) IMPLANT
DRAPE WARM FLUID 44X44 (DRAPE) ×2 IMPLANT
DRAPE X-RAY CASS 24X20 (DRAPES) IMPLANT
DRSG COVADERM 4X10 (GAUZE/BANDAGES/DRESSINGS) IMPLANT
DRSG COVADERM 4X8 (GAUZE/BANDAGES/DRESSINGS) IMPLANT
ELECT CAUTERY BLADE 6.4 (BLADE) ×2 IMPLANT
ELECT REM PT RETURN 9FT ADLT (ELECTROSURGICAL) ×4
ELECTRODE REM PT RTRN 9FT ADLT (ELECTROSURGICAL) ×2 IMPLANT
EVACUATOR SILICONE 100CC (DRAIN) IMPLANT
GAUZE SPONGE 4X4 12PLY STRL (GAUZE/BANDAGES/DRESSINGS) ×6 IMPLANT
GAUZE SPONGE 4X4 16PLY XRAY LF (GAUZE/BANDAGES/DRESSINGS) ×2 IMPLANT
GLOVE BIO SURGEON STRL SZ 6.5 (GLOVE) ×4 IMPLANT
GLOVE BIO SURGEON STRL SZ7 (GLOVE) ×2 IMPLANT
GLOVE BIO SURGEON STRL SZ7.5 (GLOVE) ×4 IMPLANT
GLOVE BIO SURGEONS STRL SZ 6.5 (GLOVE) ×4
GLOVE BIOGEL PI IND STRL 6.5 (GLOVE) IMPLANT
GLOVE BIOGEL PI IND STRL 7.0 (GLOVE) IMPLANT
GLOVE BIOGEL PI IND STRL 7.5 (GLOVE) IMPLANT
GLOVE BIOGEL PI IND STRL 8 (GLOVE) ×2 IMPLANT
GLOVE BIOGEL PI INDICATOR 6.5 (GLOVE) ×2
GLOVE BIOGEL PI INDICATOR 7.0 (GLOVE) ×4
GLOVE BIOGEL PI INDICATOR 7.5 (GLOVE) ×2
GLOVE BIOGEL PI INDICATOR 8 (GLOVE) ×2
GLOVE ECLIPSE 6.5 STRL STRAW (GLOVE) ×8 IMPLANT
GOWN STRL REUS W/ TWL LRG LVL3 (GOWN DISPOSABLE) ×6 IMPLANT
GOWN STRL REUS W/TWL LRG LVL3 (GOWN DISPOSABLE) ×24
GRAFT PROPATEN THIN WALL 6X80 (Vascular Products) ×2 IMPLANT
INSERT FOGARTY SM (MISCELLANEOUS) ×2 IMPLANT
KIT BASIN OR (CUSTOM PROCEDURE TRAY) ×4 IMPLANT
KIT ROOM TURNOVER OR (KITS) ×4 IMPLANT
LIQUID BAND (GAUZE/BANDAGES/DRESSINGS) ×4 IMPLANT
LOOP VESSEL MINI RED (MISCELLANEOUS) ×2 IMPLANT
MARKER GRAFT CORONARY BYPASS (MISCELLANEOUS) IMPLANT
NDL 18GX1X1/2 (RX/OR ONLY) (NEEDLE) IMPLANT
NEEDLE 18GX1X1/2 (RX/OR ONLY) (NEEDLE) ×4 IMPLANT
NS IRRIG 1000ML POUR BTL (IV SOLUTION) ×8 IMPLANT
PACK PERIPHERAL VASCULAR (CUSTOM PROCEDURE TRAY) ×4 IMPLANT
PAD ARMBOARD 7.5X6 YLW CONV (MISCELLANEOUS) ×8 IMPLANT
PADDING CAST COTTON 6X4 STRL (CAST SUPPLIES) IMPLANT
PENCIL BUTTON HOLSTER BLD 10FT (ELECTRODE) ×2 IMPLANT
SET COLLECT BLD 21X3/4 12 (NEEDLE) IMPLANT
SPONGE INTESTINAL PEANUT (DISPOSABLE) ×2 IMPLANT
SPONGE LAP 18X18 X RAY DECT (DISPOSABLE) ×8 IMPLANT
SPONGE SURGIFOAM ABS GEL 100 (HEMOSTASIS) IMPLANT
STAPLER VISISTAT (STAPLE) IMPLANT
STOPCOCK 4 WAY LG BORE MALE ST (IV SETS) ×2 IMPLANT
SUT ETHILON 3 0 PS 1 (SUTURE) IMPLANT
SUT PROLENE 5 0 C 1 24 (SUTURE) ×10 IMPLANT
SUT PROLENE 6 0 BV (SUTURE) ×8 IMPLANT
SUT PROLENE 7 0 BV 1 (SUTURE) IMPLANT
SUT SILK 2 0 FS (SUTURE) ×4 IMPLANT
SUT SILK 2 0 SH (SUTURE) ×4 IMPLANT
SUT SILK 3 0 (SUTURE) ×8
SUT SILK 3-0 18XBRD TIE 12 (SUTURE) IMPLANT
SUT VIC AB 2-0 CTB1 (SUTURE) ×8 IMPLANT
SUT VIC AB 3-0 SH 27 (SUTURE) ×20
SUT VIC AB 3-0 SH 27X BRD (SUTURE) ×4 IMPLANT
SUT VICRYL 4-0 PS2 18IN ABS (SUTURE) ×12 IMPLANT
SYR 20CC LL (SYRINGE) ×2 IMPLANT
SYR BULB IRRIGATION 50ML (SYRINGE) ×2 IMPLANT
SYRINGE 3CC LL L/F (MISCELLANEOUS) ×4 IMPLANT
TAPE STRIPS DRAPE STRL (GAUZE/BANDAGES/DRESSINGS) ×2 IMPLANT
TOWEL OR 17X24 6PK STRL BLUE (TOWEL DISPOSABLE) ×2 IMPLANT
TRAY FOLEY W/METER SILVER 16FR (SET/KITS/TRAYS/PACK) ×4 IMPLANT
TUBING EXTENTION W/L.L. (IV SETS) IMPLANT
UNDERPAD 30X30 INCONTINENT (UNDERPADS AND DIAPERS) ×4 IMPLANT
WATER STERILE IRR 1000ML POUR (IV SOLUTION) ×4 IMPLANT

## 2015-05-17 NOTE — Anesthesia Preprocedure Evaluation (Addendum)
Anesthesia Evaluation  Patient identified by MRN, date of birth, ID band Patient awake    Reviewed: Allergy & Precautions, NPO status , Patient's Chart, lab work & pertinent test results  History of Anesthesia Complications Negative for: history of anesthetic complications  Airway Mallampati: II  TM Distance: >3 FB Neck ROM: Full    Dental  (+) Edentulous Upper, Partial Lower, Poor Dentition, Missing, Dental Advisory Given   Pulmonary COPD, former smoker,    breath sounds clear to auscultation       Cardiovascular hypertension, Pt. on medications (-) angina+ Peripheral Vascular Disease   Rhythm:Regular Rate:Normal  '15 stress: normal perfusion   Neuro/Psych CVA (L weakness), Residual Symptoms    GI/Hepatic Neg liver ROS, GERD  Controlled,  Endo/Other  diabetes (glu 167), Oral Hypoglycemic Agents  Renal/GU negative Renal ROS     Musculoskeletal  (+) Arthritis , Osteoarthritis,    Abdominal   Peds  Hematology negative hematology ROS (+)   Anesthesia Other Findings   Reproductive/Obstetrics                            Anesthesia Physical Anesthesia Plan  ASA: III  Anesthesia Plan: General   Post-op Pain Management:    Induction: Intravenous  Airway Management Planned: Oral ETT  Additional Equipment:   Intra-op Plan:   Post-operative Plan: Extubation in OR  Informed Consent: I have reviewed the patients History and Physical, chart, labs and discussed the procedure including the risks, benefits and alternatives for the proposed anesthesia with the patient or authorized representative who has indicated his/her understanding and acceptance.   Dental advisory given  Plan Discussed with: CRNA and Surgeon  Anesthesia Plan Comments: (Plan routine monitors, GETA)        Anesthesia Quick Evaluation

## 2015-05-17 NOTE — Anesthesia Postprocedure Evaluation (Signed)
  Anesthesia Post-op Note  Patient: Jesse Avila  Procedure(s) Performed: Procedure(s): ENDARTERECTOMY FEMORAL (Right) BYPASS GRAFT FEMORAL-BELOW KNEE POPLITEAL ARTERY, using gortex propaten graft 6 mm x 80 cm (Right) VEIN PATCH ANGIOPLASTY ILEOFEMORAL ARTERY (Right)  Patient Location: PACU  Anesthesia Type:General  Level of Consciousness: awake, alert , oriented and patient cooperative  Airway and Oxygen Therapy: Patient Spontanous Breathing and Patient connected to nasal cannula oxygen  Post-op Pain: none  Post-op Assessment: Post-op Vital signs reviewed, Patient's Cardiovascular Status Stable, Respiratory Function Stable, Patent Airway, No signs of Nausea or vomiting and Pain level controlled              Post-op Vital Signs: Reviewed and stable  Last Vitals:  Filed Vitals:   05/17/15 1354  BP:   Pulse:   Temp: 36.4 C  Resp:     Complications: No apparent anesthesia complications

## 2015-05-17 NOTE — Op Note (Signed)
Right peroneal pulse heard with doppler

## 2015-05-17 NOTE — Progress Notes (Signed)
   VASCULAR SURGERY POSTOP NOTE:  * Stable post op  *  Will check CBC given tachycardia earlier.   SUBJECTIVE: Pain well controlled.   PHYSICAL EXAM: Filed Vitals:   05/17/15 1257 05/17/15 1309 05/17/15 1325 05/17/15 1354  BP: '98/56 92/51 95/49 '$   Pulse: 103 90 88   Temp: 98 F (36.7 C)   97.5 F (36.4 C)  TempSrc:      Resp: '21 19 19   '$ Height:      Weight:      SpO2: 95% 97% 93%    Good doppler flow right Peroneal artery and DP  LABS: Lab Results  Component Value Date   WBC 5.0 05/15/2015   HGB 11.4* 05/15/2015   HCT 34.9* 05/15/2015   MCV 93.3 05/15/2015   PLT 160 05/15/2015   Lab Results  Component Value Date   CREATININE 1.23 05/15/2015   Lab Results  Component Value Date   INR 1.02 05/15/2015   CBG (last 3)   Recent Labs  05/15/15 0809 05/17/15 0607 05/17/15 1257  GLUCAP 196* 167* 168*    Active Problems:   PAD (peripheral artery disease) ([place])    Gae Gallop Beeper: [phone] 05/17/2015

## 2015-05-17 NOTE — Anesthesia Procedure Notes (Addendum)
Procedure Name: Intubation Date/Time: 05/17/2015 7:36 AM Performed by: Scheryl Darter Pre-anesthesia Checklist: Patient identified, Emergency Drugs available, Suction available, Patient being monitored and Timeout performed Patient Re-evaluated:Patient Re-evaluated prior to inductionOxygen Delivery Method: Circle system utilized Preoxygenation: Pre-oxygenation with 100% oxygen Intubation Type: IV induction Ventilation: Mask ventilation without difficulty Laryngoscope Size: Mac and 3 Grade View: Grade I Tube type: Oral Tube size: 8.0 mm Number of attempts: 1 Airway Equipment and Method: Stylet Placement Confirmation: ETT inserted through vocal cords under direct vision,  positive ETCO2 and breath sounds checked- equal and bilateral Secured at: 22 cm Tube secured with: Tape Dental Injury: Teeth and Oropharynx as per pre-operative assessment

## 2015-05-17 NOTE — Op Note (Signed)
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 Extensive right iliofemoral endarterectomy 2. Iliofemoral vein patch angioplasty 3. Right femoral to below-knee popliteal artery bypass with 6 mm PTFE  SURGEON: Judeth Cornfield. Scot Dock, MD, FACS  ASSIST: Adele Barthel, MD, Leontine Locket, [place]  ANESTHESIA: Gen.   EBL: 500 cc  INDICATIONS: Jesse Avila is a 64 y.o. male he presented with progressive ischemia of the right lower extremity and severe rest pain. He had diffuse infrainguinal arterial occlusive disease and severe tibial artery occlusive disease. He felt that his best chance for limb salvage was attempted femoropopliteal bypass grafting on the right.   FINDINGS: The distal half of the right greater saphenous vein was less than 3 mm and not usable. He had diffuse extensive disease in the iliofemoral artery on the right. She had severe tibial artery occlusive disease in addition with single-vessel runoff via a diseased peroneal artery. I had some venous bleeding after tunneling and therefore elected to tunnel the graft superficially as I was reluctant to attempt tunneling again because of the venous bleeding after initially passing the tunneler.  TECHNIQUE: The patient was taken to the operating room and received a general anesthetic. The lower abdomen right groin and entire right lower extremity were prepped and draped in usual sterile fashion. A longitudinal incision was made in the right groin and through this incision the common femoral, deep femoral, and superficial femoral arteries were all dissected free. These were diffusely diseased with inflammation around the atherosclerotic arteries also. There was a large posterior medial plaque which made it impossible to clamp. Likewise the superficial femoral artery had  diffuse plaque and could not be clamped. The deep femoral artery also had significant disease proximally. I had to dissect well up onto the external iliac artery in order to find an area of the artery that could be clamped. Using 4 additional incisions along the medial aspect of the right leg great saphenous vein was harvested from the groin all the way down to the mid calf. The distal half of the vein was small. I ligated it distally and irrigated With heparinized saline and the distal half of the graft was only 3 mm and therefore I did not think that it was acceptable as a bypass conduit. It was somewhat sclerotic also. I therefore elected to use the larger part of the vein as a vein patch for iliofemoral endarterectomy.  A tunnel was created from the below-knee incision to the groin incision and there appeared to be some venous bleeding. I removed it tunneler and pressure was held for hemostasis. Ultimately I decided that the safest approach would not be to attempt tunnel in a deep plane again but to tunnel the graft superficially. Clamps were then placed on the external iliac artery. The deep femoral artery was clamped and the remaining vessels and side branches were controlled with vessel loops. A longitudinal arteriotomy was made in the common femoral artery and extended well up into the external iliac artery. There was a bulky calcific plaque at the distal common carotid artery extending into the superficial femoral artery and deep femoral artery. I felt that it would be critical to endarterectomize the proximal femoral artery. Therefore I endarterectomized the common femoral artery extending well up into the external iliac artery where the plaque tapered. The patient  had diffuse calcific disease of the superficial femoral artery but I was able to endarterectomize the proximal plaque at the origin of the superficial femoral artery. In addition and was able to endarterectomize the deep femoral artery and at  this point had excellent backbleeding from the deep femoral artery. The vein patch was opened longitudinally and used a reverse fashion. This was sewn after the artery was irrigated with copious amounts of heparin and all loose debris removed. The vein patch was sewn using continuous 5-0 Prolene suture.  I then released the clamps to reperfuse the leg. These were then clamped again after the patient was re-heparinized. A 6 mm PTFE graft was tunneled in a superficial fashion using the previous vein harvest incisions. A longitudinal venotomy was made in the vein patch in the groin and the graft was spatulated and sewn end-to-side to the vein patch using continuous 6-0 Prolene suture. At the completion the graft was clamped vessels were backbled and flushed appropriately and the anastomosis completed. The graft and poorly appropriate length for the below-knee popliteal artery. Tourniquet was placed on the thigh and the leg exsanguinated with an Esmarch bandage. A longitudinal arteriotomy was made in the below-knee popliteal artery. There was diffuse plaque in the distal below-knee popliteal artery which could not be fully appreciated on the arteriogram but I was able to get a good lumen distally and tacked the plaque with the anastomosis. The graft was cut to appropriate length, slightly spatulated, and sewn end to side to the artery using a continuous 6-0 Prolene suture. Prior to completing the anastomosis the artery was backbled and flushed and the anastomosis completed. Flow was reestablished to the right leg. He had known severe tibial artery occlusive disease and I did not think an intraoperative arteriogram would be helpful as I did not think he would have further options for revascularization distally. In addition urine output was low and I did not want to risk given him contrast.  There was a good peroneal signal at the completion. Hemostasis was obtained in the vein harvest incisions and these were closed  with deeply or 3-0 Vicryl and the skin closed with 4-0 Vicryl. The below the knee incision was closed with 2 deep layers of 3-0 Vicryl and the skin closed with 4-0 Vicryl. The groin incision was closed with a pair of 2-0 Vicryl, subcutaneous layer with 3-0 Vicryl, and the skin closed with a 4-0 Vicryl. Sterile dressing was applied. Given the venous bleeding that occurred with tunneling I elected to place a 6 inch Ace on the leg and thigh. The patient tolerated procedure well and transferred to the recovery room in stable condition. All needle and sponge counts were correct.  Deitra Mayo, MD, FACS [facility]  DATE OF DICTATION:   05/17/2015

## 2015-05-17 NOTE — H&P (Signed)
Established Intermittent Claudication  History of Present Illness  Jesse Avila is a 64 y.o. (01/27/1951) male who presents for continued discussion of his progressive bilateral lower extremity claudication, right greater than left. He underwent right lower extremity vein mapping today in our office. He recently underwent an arteriogram by Dr. Scot Dock on 03/23/15. On the right side, there was severe disease in the common femoral, proximal deep femoral, superficial femoral and popliteal arteries. The below-knee popliteal artery is patent with single vessel runoff via the peroneal artery.  He also inquires about his left extremity. His left claudication is not as severe as his right. He recently had to quit his job due to his symptoms and will only have health insurance for a limited time. He has previously tried a walking program that initially improved his symptoms. However, since resuming ambulation after his recent knee surgery (12/26/14), he has had difficulties with severe claudication.   The patient denies any rest pain or non healing wounds. He denies any chest pain or discomfort. He has never had cardiac surgery.   He has a history of bilateral common iliac stenting (03/29/12) and right common iliac angioplasty (05/10/12). Physical Examination  Filed Vitals:   04/11/15 1523  BP: 125/70  Pulse: 112  Temp: 98.2 F (36.8 C)  Resp: 18  Height: '6\' 1"'$  (1.854 m)  Weight: 215 lb (97.523 kg)  SpO2: 94%   Filed Vitals:   05/17/15 0557  BP: 124/69  Pulse: 76  Temp: 98.7 F (37.1 C)  Resp: 20   Body mass index is 28.37 kg/(m^2).  General: A&O x 3, WDWN male in NAD  Pulmonary: Sym exp, good air movt, CTAB, no rales, rhonchi, & wheezing  Cardiac: RRR, Nl S1, S2, no Murmurs, rubs or gallops. No carotid bruits.   Vascular: Non palpable pedal pulses. Extremities without ischemic changes.   Musculoskeletal: No muscle wasting or atrophy.   Neurologic: Motor and  sensory intact bilateral lower extremities.   Non-Invasive Vascular Imaging ABI (Date: 04/11/2015)  R: 0 (0.61), DP: none, PT: none, TBI: 0  L: 0.55 (0.69), DP: mono, PT: none, TBI: 0.32  Vein Mapping (04/11/15)  Right GSV: 0.34 distal calf, 0.36 at knee, 0.52 at saphenofemoral junction  Medical Decision Making  Jesse Avila is a 64 y.o. male who presents with: bilateral leg lifestyle limiting intermittent claudication without evidence of critical limb ischemia.  He has failed conservative therapy with a walking program. He is on maximal medical management with aspirin and a statin. He is not a smoker.  Recommend right common femoral endarterectomy with right femoral to below knee popliteal bypass.   The patient's vein map today reveals an adequate conduit for bypass.   The procedure, risks and benefits were discussed at length with the patient and he is willing to proceed.   The surgery will be scheduled for 05/08/15 with Dr. Callie Fielding, PA-C    Agree with above. The patient has disabling claudication of the right lower extremity and wishes to pursue revascularization. I have reviewed his arteriogram from 03/23/2015 which shows significant disease in his right common femoral artery and also proximal deep femoral artery. There is severe diffuse disease of the superficial femoral artery and reconstitution of the below-knee popliteal artery. He appears to have a reasonable vein on his vein map today. I have recommended that we proceed with right femoropopliteal bypass grafting given the progression of his symptoms. I have reviewed the indications for lower extremity bypass. I have  also reviewed the potential complications of surgery including but not limited to: wound healing problems, infection, graft thrombosis, limb loss, or other unpredictable medical problems. All the patient's questions were answered and they are agreeable to proceed. His surgery is scheduled for October  13.   Jesse Mayo, MD, [place] [phone] Office: [phone]

## 2015-05-17 NOTE — Transfer of Care (Signed)
Immediate Anesthesia Transfer of Care Note  Patient: Jesse Avila  Procedure(s) Performed: Procedure(s): ENDARTERECTOMY FEMORAL (Right) BYPASS GRAFT FEMORAL-BELOW KNEE POPLITEAL ARTERY, using gortex propaten graft 6 mm x 80 cm (Right) VEIN PATCH ANGIOPLASTY ILEOFEMORAL ARTERY (Right)  Patient Location: PACU  Anesthesia Type:General  Level of Consciousness: awake, alert , oriented and sedated  Airway & Oxygen Therapy: Patient Spontanous Breathing and Patient connected to nasal cannula oxygen  Post-op Assessment: Report given to RN, Post -op Vital signs reviewed and stable and Patient moving all extremities  Post vital signs: Reviewed and stable  Last Vitals:  Filed Vitals:   05/17/15 1257  BP:   Pulse:   Temp: 36.7 C  Resp:     Complications: No apparent anesthesia complications

## 2015-05-18 ENCOUNTER — Encounter (HOSPITAL_COMMUNITY): Payer: Self-pay | Admitting: Vascular Surgery

## 2015-05-18 LAB — CBC
HEMATOCRIT: 24.2 % — AB (ref 39.0–52.0)
HEMOGLOBIN: 8.1 g/dL — AB (ref 13.0–17.0)
MCH: 31.5 pg (ref 26.0–34.0)
MCHC: 33.5 g/dL (ref 30.0–36.0)
MCV: 94.2 fL (ref 78.0–100.0)
Platelets: 128 10*3/uL — ABNORMAL LOW (ref 150–400)
RBC: 2.57 MIL/uL — ABNORMAL LOW (ref 4.22–5.81)
RDW: 13.7 % (ref 11.5–15.5)
WBC: 6.5 10*3/uL (ref 4.0–10.5)

## 2015-05-18 LAB — BASIC METABOLIC PANEL
ANION GAP: 7 (ref 5–15)
BUN: 13 mg/dL (ref 6–20)
CHLORIDE: 101 mmol/L (ref 101–111)
CO2: 26 mmol/L (ref 22–32)
Calcium: 8.2 mg/dL — ABNORMAL LOW (ref 8.9–10.3)
Creatinine, Ser: 1.21 mg/dL (ref 0.61–1.24)
GFR calc non Af Amer: >60 mL/min (ref >60)
GLUCOSE: 224 mg/dL — AB (ref 65–99)
POTASSIUM: 4 mmol/L (ref 3.5–5.1)
Sodium: 134 mmol/L — ABNORMAL LOW (ref 135–145)

## 2015-05-18 LAB — GLUCOSE, CAPILLARY
GLUCOSE-CAPILLARY: 205 mg/dL — AB (ref 65–99)
GLUCOSE-CAPILLARY: 228 mg/dL — AB (ref 65–99)
GLUCOSE-CAPILLARY: 189 mg/dL — AB (ref 65–99)
Glucose-Capillary: 203 mg/dL — ABNORMAL HIGH (ref 65–99)

## 2015-05-18 MED ORDER — DOCUSATE SODIUM 100 MG PO CAPS: 100.0000 mg | ORAL_CAPSULE | Freq: Two times a day (BID) | ORAL | Status: DC | PRN

## 2015-05-18 MED ORDER — FERROUS SULFATE 325 (65 FE) MG PO TABS
325.0000 mg | ORAL_TABLET | Freq: Three times a day (TID) | ORAL | Status: DC
  Administered 2015-05-18 – 2015-05-20 (×6): 325 mg via ORAL
  Filled 2015-05-18 (×9): qty 1

## 2015-05-18 MED ORDER — CETYLPYRIDINIUM CHLORIDE 0.05 % MT LIQD: 7.0000 mL | Freq: Two times a day (BID) | OROMUCOSAL | Status: DC

## 2015-05-18 NOTE — Progress Notes (Addendum)
  Progress Note Agree with note below.  * Acute blood loss anemia: Hgb = 8.1. Will start FeSO4 and colace. If Hgb drifts lower or he becomes symptomatic, may require transfusion.  * Ambulate   * Continue ace bandage to thigh given venous bleeding with tunneling. Elevate leg when in bed.   * transfer to [place], MD, FACS Beeper [phone]   05/18/2015 7:19 AM 1 Day Post-Op  Subjective:  C/o soreness  Tm 99.3 HR 100's-130's NSR 030'D-314'H systolic 88% [id number]  Filed Vitals:   05/18/15 0400  BP: 130/65  Pulse: 95  Temp: 99.3 F (37.4 C)  Resp: 24    Physical Exam: Cardiac:  Regular Lungs:  Non labored Incisions:  Right groin is c/d/i without hematoma Extremities:  Brisk doppler signals right DP > peroneal.  Monophasic right PT; calf is soft.   CBC    Component Value Date/Time   WBC 6.4 05/17/2015 2239   RBC 2.63* 05/17/2015 2239   HGB 8.3* 05/17/2015 2239   HCT 24.8* 05/17/2015 2239   PLT 116* 05/17/2015 2239   MCV 94.3 05/17/2015 2239   MCH 31.6 05/17/2015 2239   MCHC 33.5 05/17/2015 2239   RDW 13.7 05/17/2015 2239   LYMPHSABS 1.3 04/14/2014 1127   MONOABS 0.3 04/14/2014 1127   EOSABS 0.3 04/14/2014 1127   BASOSABS 0.0 04/14/2014 1127    BMET    Component Value Date/Time   NA 137 05/15/2015 0900   K 3.8 05/15/2015 0900   CL 101 05/15/2015 0900   CO2 26 05/15/2015 0900   GLUCOSE 259* 05/15/2015 0900   BUN 17 05/15/2015 0900   CREATININE 1.31* 05/17/2015 2239   CALCIUM 9.8 05/15/2015 0900   GFRNONAA 57* 05/17/2015 2239   GFRAA >60 05/17/2015 2239    INR    Component Value Date/Time   INR 1.02 05/15/2015 0900     Intake/Output Summary (Last 24 hours) at 05/18/15 0719 Last data filed at 05/18/15 0457  Gross per 24 hour  Intake   4300 ml  Output   3155 ml  Net   1145 ml     Assessment:  64 y.o. male is s/p:  1. Extensive right iliofemoral endarterectomy 2. Iliofemoral vein patch angioplasty 3. Right femoral to  below-knee popliteal artery bypass with 6 mm PTFE  1 Day Post-Op  Plan: -pt doing well this morning with brisk DP/peroneal doppler signal -DVT prophylaxis:  Prophylactic Lovenox to start this am ('40mg'$  daily) keep an eye on creatinine as it has slightly risen to 1.31 from 1.23 -ck CBC & BMET in the morning -d/c catheter this morning -ace wrap left in place until Dr. Scot Dock sees pt this morning -start to mobilize if ok with Dr. Scot Dock -dry gauze to right groin to help wick moisture to help prevent wound infection . -possible transfer to [place] later this morning after Dr. Scot Dock sees pt.  Leontine Locket, PA-C [facility] [phone] 05/18/2015 7:19 AM

## 2015-05-18 NOTE — Evaluation (Signed)
Physical Therapy Evaluation Patient Details Name: Jesse Avila MRN: [id number] DOB: 12/04/1950 Today's Date: 05/18/2015   History of Present Illness  Pt is a 64 y/o M s/p Rt fem pop bypass.  Pt's PMH inlcudes Lt partial knee arthroplasty, stroke, gout.  Clinical Impression  Patient is s/p above surgery resulting in functional limitations due to the deficits listed below (see PT Problem List). Mr. Bunda is ambulating at supervision level w/ RW.  He expresses desire to use RW for safety until he feels comfortable to switch to using cane at home. He lives at home w/ his wife who will be available for 24/7 assist at d/c. Patient will benefit from skilled PT to increase their independence and safety with mobility to allow discharge to the venue listed below.      Follow Up Recommendations No PT follow up    Equipment Recommendations  None recommended by PT    Recommendations for Other Services       Precautions / Restrictions Precautions Precautions: Fall Restrictions Weight Bearing Restrictions: No      Mobility  Bed Mobility               General bed mobility comments: pt in chair  Transfers Overall transfer level: Needs assistance Equipment used: Rolling walker (2 wheeled) Transfers: Sit to/from Stand Sit to Stand: Supervision         General transfer comment: No cues or assist needed.  Ambulation/Gait Ambulation/Gait assistance: Supervision Ambulation Distance (Feet): 300 Feet Assistive device: Rolling walker (2 wheeled) Gait Pattern/deviations: Step-through pattern;Decreased stride length;Antalgic;Trunk flexed;Decreased weight shift to right   Gait velocity interpretation: Below normal speed for age/gender General Gait Details: Pt WB minimally through Bil UEs to offload Rt LE.  Lacking full extension in Lt knee w/ ambulation (h/o partial Lt knee arthroplasty)  Stairs            Wheelchair Mobility    Modified Rankin (Stroke Patients Only)        Balance Overall balance assessment: Needs assistance Sitting-balance support: No upper extremity supported;Feet supported Sitting balance-Leahy Scale: Good     Standing balance support: Bilateral upper extremity supported;During functional activity Standing balance-Leahy Scale: Fair                   Standardized Balance Assessment Standardized Balance Assessment : Dynamic Gait Index   Dynamic Gait Index Level Surface: Mild Impairment Change in Gait Speed: Mild Impairment Gait with Horizontal Head Turns: Normal Gait with Vertical Head Turns: Normal Gait and Pivot Turn: Normal       Pertinent Vitals/Pain Pain Assessment: 0-10 Pain Score: 5  Pain Location: Rt LE Pain Descriptors / Indicators: Sore Pain Intervention(s): Limited activity within patient's tolerance;Monitored during session;Repositioned    Home Living Family/patient expects to be discharged to:: Private residence Living Arrangements: Spouse/significant other Available Help at Discharge: Family;Available 24 hours/day Type of Home: House Home Access: Stairs to enter Entrance Stairs-Rails: None Entrance Stairs-Number of Steps: 1 Home Layout: One level Home Equipment: Walker - 2 wheels;Cane - single point;Bedside commode      Prior Function Level of Independence: Independent               Hand Dominance   Dominant Hand: Left    Extremity/Trunk Assessment   Upper Extremity Assessment: Defer to OT evaluation           Lower Extremity Assessment: RLE deficits/detail;LLE deficits/detail RLE Deficits / Details: limited ROM as expected s/p Rt fem pop bypass LLE Deficits /  Details: functionally limited Lt knee extension w/ ambulation noted     Communication   Communication: No difficulties  Cognition Arousal/Alertness: Awake/alert Behavior During Therapy: WFL for tasks assessed/performed Overall Cognitive Status: Within Functional Limits for tasks assessed                       General Comments General comments (skin integrity, edema, etc.): Discussed pt transitioning to cane at home once he feels stable enough to not use RW.  Instructed him to have supervision when ambulating in home for the next week and also to ambulate around home at least every hour.      Exercises General Exercises - Lower Extremity Hip Flexion/Marching: AROM;Both;10 reps;Standing      Assessment/Plan    PT Assessment Patient needs continued PT services  PT Diagnosis Abnormality of gait;Acute pain   PT Problem List Decreased strength;Decreased range of motion;Decreased activity tolerance;Decreased balance;Decreased mobility;Decreased knowledge of use of DME;Decreased safety awareness;Decreased knowledge of precautions;Pain;Decreased skin integrity  PT Treatment Interventions     PT Goals (Current goals can be found in the Care Plan section) Acute Rehab PT Goals Patient Stated Goal: to go home once ready PT Goal Formulation: With patient Time For Goal Achievement: 05/25/15 Potential to Achieve Goals: Good    Frequency Min 2X/week   Barriers to discharge Inaccessible home environment      Co-evaluation               End of Session Equipment Utilized During Treatment: Gait belt Activity Tolerance: Patient tolerated treatment well Patient left: in chair;with call bell/phone within reach;with family/visitor present Nurse Communication: Mobility status;Precautions         Time: 2863-8177 PT Time Calculation (min) (ACUTE ONLY): 12 min   Charges:   PT Evaluation $Initial PT Evaluation Tier I: 1 Procedure     PT G CodesJoslyn Hy PT, DPT [phone] Pager: [phone] 05/18/2015, 12:54 PM

## 2015-05-18 NOTE — Progress Notes (Signed)
Utilization review completed.  

## 2015-05-18 NOTE — Evaluation (Signed)
Occupational Therapy Evaluation Patient Details Name: Jesse Avila MRN: [id number] DOB: 06/05/1951 Today's Date: 05/18/2015    History of Present Illness Jesse Avila is a 64 y.o. (10/31/1950) male who presents for continued discussion of his progressive bilateral lower extremity claudication, right greater than left.  Pt underwent BYPASS GRAFT FEMORAL-BELOW KNEE POPLITEAL ARTERY.   Clinical Impression   Pt admitted for vascular surgery. Pt currently with functional limitations due to the deficits listed below (see OT Problem List).  Pt will benefit from skilled OT to increase their safety and independence with ADL and functional mobility for ADL to facilitate discharge to venue listed below.      Follow Up Recommendations  No OT follow up    Equipment Recommendations  None recommended by OT           Mobility Bed Mobility               General bed mobility comments: pt in chair  Transfers Overall transfer level: Needs assistance Equipment used: Rolling walker (2 wheeled) Transfers: Sit to/from Stand Sit to Stand: Min assist         General transfer comment: VC for safety and hand placement         ADL Overall ADL's : Needs assistance/impaired Eating/Feeding: Independent;Sitting   Grooming: Independent;Sitting       Lower Body Bathing: Moderate assistance;Sit to/from stand       Lower Body Dressing: Moderate assistance;Sit to/from stand   Toilet Transfer: Minimal Insurance claims handler Details (indicate cue type and reason): sit to stand Toileting- Clothing Manipulation and Hygiene: Minimal assistance;Sit to/from stand                         Pertinent Vitals/Pain Pain Assessment: 0-10 Pain Score: 4  Pain Location: R leg Pain Descriptors / Indicators: Sore Pain Intervention(s): Limited activity within patient's tolerance;Monitored during session;Repositioned;Patient requesting pain meds-RN notified        Extremity/Trunk  Assessment Upper Extremity Assessment Upper Extremity Assessment: Overall WFL for tasks assessed           Communication Communication Communication: No difficulties   Cognition Arousal/Alertness: Awake/alert Behavior During Therapy: WFL for tasks assessed/performed Overall Cognitive Status: Within Functional Limits for tasks assessed                     General Comments   wife will a as needed            Home Living Family/patient expects to be discharged to:: Private residence Living Arrangements: Spouse/significant other Available Help at Discharge: Family;Available 24 hours/day Type of Home: House Home Access: Stairs to enter [employer] of Steps: 2 Entrance Stairs-Rails: None Home Layout: One level     Bathroom Shower/Tub: Tub/shower unit;Door   [employer]: Standard                Prior Functioning/Environment Level of Independence: Independent             OT Diagnosis: Generalized weakness;Acute pain   OT Problem List: Decreased strength;Decreased activity tolerance;Decreased safety awareness   OT Treatment/Interventions: Self-care/ADL training;DME and/or AE instruction;Patient/family education    OT Goals(Current goals can be found in the care plan section) Acute Rehab OT Goals 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 OT Goal Formulation: With patient/family Time For Goal Achievement: 06/01/15 Potential to Achieve Goals: Good ADL Goals Pt Will Perform Lower Body Dressing: with supervision;sit to/from stand Pt Will Transfer to  Toilet: with supervision;ambulating;regular height toilet Pt Will Perform Toileting - Clothing Manipulation and hygiene: with supervision;sit to/from stand  OT Frequency: Min 2X/week              End of Session    Activity Tolerance: Patient tolerated treatment well Patient left: in chair;with call bell/phone within reach;with family/visitor present   Time: 1120-1145 OT Time Calculation  (min): 25 min Charges:  OT General Charges $OT Visit: 1 Procedure OT Evaluation $Initial OT Evaluation Tier I: 1 Procedure OT Treatments $Self Care/Home Management : 8-22 mins G-Codes:    Payton Mccallum D 2015/06/07, 12:00 PM

## 2015-05-18 NOTE — Progress Notes (Signed)
Pt to [place] to 2W-38, VSS, called report. Family aware of [place].

## 2015-05-19 LAB — COMPREHENSIVE METABOLIC PANEL
ALK PHOS: 32 U/L — AB (ref 38–126)
ALT: 19 U/L (ref 17–63)
AST: 14 U/L — ABNORMAL LOW (ref 15–41)
Albumin: 3.1 g/dL — ABNORMAL LOW (ref 3.5–5.0)
Anion gap: 5 (ref 5–15)
BUN: 15 mg/dL (ref 6–20)
CALCIUM: 9 mg/dL (ref 8.9–10.3)
CO2: 29 mmol/L (ref 22–32)
CREATININE: 1.19 mg/dL (ref 0.61–1.24)
Chloride: 103 mmol/L (ref 101–111)
GFR calc non Af Amer: >60 mL/min (ref >60)
Glucose, Bld: 162 mg/dL — ABNORMAL HIGH (ref 65–99)
Potassium: 4.2 mmol/L (ref 3.5–5.1)
SODIUM: 137 mmol/L (ref 135–145)
Total Bilirubin: 0.5 mg/dL (ref 0.3–1.2)
Total Protein: 6.1 g/dL — ABNORMAL LOW (ref 6.5–8.1)

## 2015-05-19 LAB — CBC
HCT: 23.1 % — ABNORMAL LOW (ref 39.0–52.0)
HEMOGLOBIN: 7.7 g/dL — AB (ref 13.0–17.0)
MCH: 31.6 pg (ref 26.0–34.0)
MCHC: 33.3 g/dL (ref 30.0–36.0)
MCV: 94.7 fL (ref 78.0–100.0)
Platelets: 122 10*3/uL — ABNORMAL LOW (ref 150–400)
RBC: 2.44 MIL/uL — AB (ref 4.22–5.81)
RDW: 13.9 % (ref 11.5–15.5)
WBC: 6 10*3/uL (ref 4.0–10.5)

## 2015-05-19 LAB — GLUCOSE, CAPILLARY
GLUCOSE-CAPILLARY: 182 mg/dL — AB (ref 65–99)
GLUCOSE-CAPILLARY: 155 mg/dL — AB (ref 65–99)
Glucose-Capillary: 178 mg/dL — ABNORMAL HIGH (ref 65–99)
Glucose-Capillary: 144 mg/dL — ABNORMAL HIGH (ref 65–99)

## 2015-05-19 LAB — HEMOGLOBIN A1C
Hgb A1c MFr Bld: 7.4 % — ABNORMAL HIGH (ref 4.8–5.6)
MEAN PLASMA GLUCOSE: 166 mg/dL

## 2015-05-19 NOTE — Progress Notes (Signed)
  Progress Note    05/19/2015 7:59 AM 2 Days Post-Op  Subjective:  He states his foot feels a lot better.  Feeling is back in his great toe.  Wants to go home.  Afebrile HR 100's NSR 829'H-371'I systolic 96% RA  Filed Vitals:   05/19/15 0407  BP: 140/64  Pulse: 104  Temp: 97.8 F (36.6 C)  Resp: 18    Physical Exam: Cardiac:  regular Lungs:  Non labored Incisions:  All healing nicely Extremities:  Brisk doppler flow right DP/PT/peroneal; 1-2+ pitting edema RLE   CBC    Component Value Date/Time   WBC 6.0 05/19/2015 0349   RBC 2.44* 05/19/2015 0349   HGB 7.7* 05/19/2015 0349   HCT 23.1* 05/19/2015 0349   PLT 122* 05/19/2015 0349   MCV 94.7 05/19/2015 0349   MCH 31.6 05/19/2015 0349   MCHC 33.3 05/19/2015 0349   RDW 13.9 05/19/2015 0349   LYMPHSABS 1.3 04/14/2014 1127   MONOABS 0.3 04/14/2014 1127   EOSABS 0.3 04/14/2014 1127   BASOSABS 0.0 04/14/2014 1127    BMET    Component Value Date/Time   NA 137 05/19/2015 0349   K 4.2 05/19/2015 0349   CL 103 05/19/2015 0349   CO2 29 05/19/2015 0349   GLUCOSE 162* 05/19/2015 0349   BUN 15 05/19/2015 0349   CREATININE 1.19 05/19/2015 0349   CALCIUM 9.0 05/19/2015 0349   GFRNONAA >60 05/19/2015 0349   GFRAA >60 05/19/2015 0349    INR    Component Value Date/Time   INR 1.02 05/15/2015 0900     Intake/Output Summary (Last 24 hours) at 05/19/15 0759 Last data filed at 05/19/15 0407  Gross per 24 hour  Intake 1468.5 ml  Output   1350 ml  Net  118.5 ml     Assessment:  64 y.o. male is s/p:  1. Extensive right iliofemoral endarterectomy 2. Iliofemoral vein patch angioplasty 3. Right femoral to below-knee popliteal artery bypass with 6 mm PTFE   2 Days Post-Op  Plan: -pt with brisk doppler signals right DP/peroneal/PT -incisions healing nicely -hgb drifted down slightly-recheck tomorrow.  Pt is asymptomatic.  If drops more, may need transfusion. -continue dry gauze to right groin to wick moisture  to help prevent wound infection -continue leg elevation while in bed -mobilizing well -DVT prophylaxis:  Lovenox   Leontine Locket, PA-C [facility] [phone] 05/19/2015 7:59 AM    I agree with the above.  I have seen and evaluated the patient.  He has good Doppler signals in his right foot.  He does have 2+ pitting edema.  He has been able to ambulate without difficulty and wants to go home today.  The patient's hemoglobin has continued to decrease.  Today it measures 7.8 which is down from 8.1 yesterday.  The patient is asymptomatic, however I do not think he should go home while it is continuing to drift down.  We will repeat this tomorrow.  If he continues to decrease he may need it blood transfusion.  If it starts to increase, he would be stable for discharge.  His Ace wrap was removed today.  If he gets replaced it will be wrapped from the toes up to the groin as he appears to have more swelling in the lower leg below the Ace wrap.  Annamarie Major

## 2015-05-20 LAB — CBC
HCT: 23.3 % — ABNORMAL LOW (ref 39.0–52.0)
Hemoglobin: 7.6 g/dL — ABNORMAL LOW (ref 13.0–17.0)
MCH: 30.6 pg (ref 26.0–34.0)
MCHC: 32.6 g/dL (ref 30.0–36.0)
MCV: 94 fL (ref 78.0–100.0)
PLATELETS: 139 10*3/uL — AB (ref 150–400)
RBC: 2.48 MIL/uL — ABNORMAL LOW (ref 4.22–5.81)
RDW: 13.8 % (ref 11.5–15.5)
WBC: 5 10*3/uL (ref 4.0–10.5)

## 2015-05-20 LAB — GLUCOSE, CAPILLARY
GLUCOSE-CAPILLARY: 157 mg/dL — AB (ref 65–99)
GLUCOSE-CAPILLARY: 143 mg/dL — AB (ref 65–99)

## 2015-05-20 LAB — HEMOGLOBIN AND HEMATOCRIT, BLOOD
HCT: 27.6 % — ABNORMAL LOW (ref 39.0–52.0)
Hemoglobin: 9 g/dL — ABNORMAL LOW (ref 13.0–17.0)

## 2015-05-20 LAB — PREPARE RBC (CROSSMATCH)

## 2015-05-20 MED ORDER — OXYCODONE HCL 5 MG PO TABS: 5.0000 mg | ORAL_TABLET | Freq: Four times a day (QID) | ORAL | Status: DC | PRN

## 2015-05-20 MED ORDER — SODIUM CHLORIDE 0.9 % IV SOLN: Freq: Once | INTRAVENOUS | Status: AC

## 2015-05-20 MED ORDER — DOCUSATE SODIUM 100 MG PO CAPS: 100.0000 mg | ORAL_CAPSULE | Freq: Every day | ORAL | Status: DC

## 2015-05-20 MED ORDER — FUROSEMIDE 10 MG/ML IJ SOLN
20.0000 mg | Freq: Once | INTRAMUSCULAR | Status: DC
Start: 2015-05-20 — End: 2015-05-20

## 2015-05-20 MED ORDER — FERROUS SULFATE 325 (65 FE) MG PO TABS: 325.0000 mg | ORAL_TABLET | Freq: Three times a day (TID) | ORAL | Status: DC

## 2015-05-20 NOTE — Progress Notes (Signed)
  Progress Note    05/20/2015 7:51 AM 3 Days Post-Op  Subjective:  Wants to go home.  Tm 99.4 now afebrile HR 70's-80's NSR 532'Y-233'I systolic 35% RA  Filed Vitals:   05/20/15 0447  BP: 122/62  Pulse: 79  Temp: 98.3 F (36.8 C)  Resp: 18    Physical Exam: Cardiac:  regular Lungs:  Non labored Incisions:  All are healing nicely Extremities:  Right foot warm with + palpable right DP pulse   CBC    Component Value Date/Time   WBC 5.0 05/20/2015 0445   RBC 2.48* 05/20/2015 0445   HGB 7.6* 05/20/2015 0445   HCT 23.3* 05/20/2015 0445   PLT 139* 05/20/2015 0445   MCV 94.0 05/20/2015 0445   MCH 30.6 05/20/2015 0445   MCHC 32.6 05/20/2015 0445   RDW 13.8 05/20/2015 0445   LYMPHSABS 1.3 04/14/2014 1127   MONOABS 0.3 04/14/2014 1127   EOSABS 0.3 04/14/2014 1127   BASOSABS 0.0 04/14/2014 1127    BMET    Component Value Date/Time   NA 137 05/19/2015 0349   K 4.2 05/19/2015 0349   CL 103 05/19/2015 0349   CO2 29 05/19/2015 0349   GLUCOSE 162* 05/19/2015 0349   BUN 15 05/19/2015 0349   CREATININE 1.19 05/19/2015 0349   CALCIUM 9.0 05/19/2015 0349   GFRNONAA >60 05/19/2015 0349   GFRAA >60 05/19/2015 0349    INR    Component Value Date/Time   INR 1.02 05/15/2015 0900    No intake or output data in the 24 hours ending 05/20/15 0751   Assessment:  64 y.o. male is s/p:  1. Extensive right iliofemoral endarterectomy 2. Iliofemoral vein patch angioplasty 3. Right femoral to below-knee popliteal artery bypass with 6 mm PTFE    3 Days Post-Op  Plan: -pt doing well this am-he does have a palpable right DP pulse -incisions healing nicely -acute surgical blood loss anemia with slight drop in hgb this am - will transfuse one unit of PRBC's -plan to d/c tomorrow -DVT prophylaxis:  Lovenox   Leontine Locket, PA-C [facility] [phone] 05/20/2015 7:51 AM    I agree with above.  I have seen and evaluated the patient.He has  excellent doppler signals.  All incision are healing.  2+ edema in right leg.  Continue to keep leg elevated.  Hb decreased slightly today.  Will transfuse one unit of pRBC today.  Patient really wants to go home today.  I will see how he tolerates the blood transfusion and possibly d/c later today   Annamarie Major,

## 2015-05-20 NOTE — Discharge Summary (Signed)
Discharge Summary     Jesse Avila [birth year] 64 y.o. male  [id number]  Admission Date: 05/17/2015  Discharge Date: 05/20/15  Physician: Angelia Mould, MD  Admission Diagnosis: Peripheral vascular disease with bilateral lower extremity claudication I70.213   HPI:   This is a 64 y.o. male who presents for continued discussion of his progressive bilateral lower extremity claudication, right greater than left. He underwent right lower extremity vein mapping today in our office. He recently underwent an arteriogram by Dr. Scot Dock on 03/23/15. On the right side, there was severe disease in the common femoral, proximal deep femoral, superficial femoral and popliteal arteries. The below-knee popliteal artery is patent with single vessel runoff via the peroneal artery.  He also inquires about his left extremity. His left claudication is not as severe as his right. He recently had to quit his job due to his symptoms and will only have health insurance for a limited time. He has previously tried a walking program that initially improved his symptoms. However, since resuming ambulation after his recent knee surgery (12/26/14), he has had difficulties with severe claudication.   The patient denies any rest pain or non healing wounds. He denies any chest pain or discomfort. He has never had cardiac surgery.   He has a history of bilateral common iliac stenting (03/29/12) and right common iliac angioplasty (05/10/12).  Hospital Course:  The patient was admitted to the hospital and taken to the operating room on 05/17/2015 and underwent: 1. Extensive right iliofemoral endarterectomy 2. Iliofemoral vein patch angioplasty 3. Right femoral to below-knee popliteal artery bypass with 6 mm PTFE    Findings intraoperatively are as follows:  The distal half of the right greater saphenous vein was less than 3 mm and not usable. He had diffuse extensive disease in the iliofemoral artery on the  right. She had severe tibial artery occlusive disease in addition with single-vessel runoff via a diseased peroneal artery. I had some venous bleeding after tunneling and therefore elected to tunnel the graft superficially as I was reluctant to attempt tunneling again because of the venous bleeding after initially passing the tunneler.  The pt tolerated the procedure well and was transported to the PACU in good condition.   He did have some tachycardia post operatively and this did resolve.  He did have acute blood loss anemia and subsequently did receive one unit of PRBC's.  He was started on iron and colace. He is given instructions on leg elevation and it is to be elevated when not ambulating.  Throughout his hospital course, his doppler signals significantly improved and did have a palpable right DP by discharge.  The remainder of the hospital course consisted of increasing mobilization and increasing intake of solids without difficulty.  CBC    Component Value Date/Time   WBC 5.0 05/20/2015 0445   RBC 2.48* 05/20/2015 0445   HGB 7.6* 05/20/2015 0445   HCT 23.3* 05/20/2015 0445   PLT 139* 05/20/2015 0445   MCV 94.0 05/20/2015 0445   MCH 30.6 05/20/2015 0445   MCHC 32.6 05/20/2015 0445   RDW 13.8 05/20/2015 0445   LYMPHSABS 1.3 04/14/2014 1127   MONOABS 0.3 04/14/2014 1127   EOSABS 0.3 04/14/2014 1127   BASOSABS 0.0 04/14/2014 1127    BMET    Component Value Date/Time   NA 137 05/19/2015 0349   K 4.2 05/19/2015 0349   CL 103 05/19/2015 0349   CO2 29 05/19/2015 0349   GLUCOSE 162* 05/19/2015 0349  BUN 15 05/19/2015 0349   CREATININE 1.19 05/19/2015 0349   CALCIUM 9.0 05/19/2015 0349   GFRNONAA >60 05/19/2015 0349   GFRAA >60 05/19/2015 0349       Discharge Instructions    Call MD for:  redness, tenderness, or signs of infection (pain, swelling, bleeding, redness, odor or green/yellow discharge around incision site)    Complete by:  As directed      Call MD for:   severe or increased pain, loss or decreased feeling  in affected limb(s)    Complete by:  As directed      Call MD for:  temperature >100.5    Complete by:  As directed      Discharge wound care:    Complete by:  As directed   Wash the groin wound with soap and water daily and pat dry. (No tub bath-only shower)  Then put a dry gauze or washcloth there to keep this area dry daily and as needed.  Do not use Vaseline or neosporin on your incisions.  Only use soap and water on your incisions and then protect and keep dry.     Driving Restrictions    Complete by:  As directed   No driving for 2 weeks     Lifting restrictions    Complete by:  As directed   No lifting for 4 weeks     Resume previous diet    Complete by:  As directed            Discharge Diagnosis:  Peripheral vascular disease with bilateral lower extremity claudication I70.213  Secondary Diagnosis: Patient Active Problem List   Diagnosis Date Noted  . PAD (peripheral artery disease) ([place]) 05/17/2015  . Primary localized osteoarthritis of left knee 12/26/2014  . Knee osteoarthritis 12/26/2014  . Occlusion and stenosis of carotid artery without mention of cerebral infarction 04/21/2014  . History of stroke 04/21/2014  . Carotid stenosis with cerebral infarction less than 8 weeks ago ([place]) 04/16/2014  . Diabetes mellitus, type 2 ([place]) 04/14/2014  . Obesity 04/14/2014  . Insomnia 10/18/2013  . Preop cardiovascular exam 12/12/2011  . DJD (degenerative joint disease) of knee   . ED (erectile dysfunction)   . GERD (gastroesophageal reflux disease)   . Essential hypertension, benign   . Peripheral vascular disease ([place])   . Hyperlipidemia   . Critical lower limb ischemia 11/19/2011   Past Medical History  Diagnosis Date  . Hyperlipidemia     takes Fenofibrate and Crestor daily  . Peripheral vascular disease ([place])   . DJD (degenerative joint disease) of knee   . Carotid artery occlusion   . Essential hypertension,  benign     takes Benicar daily  . 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 Insomnia     takes Ambien nightly as needed  . Joint pain   . History of gout   . History of colon polyps     benign  . Nocturia   . Primary localized osteoarthritis of left knee 12/26/2014  . Stroke [facility]) 2015?    takes Plavix daily as well as Pletal,not on plavix at present 05/15/15       Medication List    TAKE these medications        ACCU-CHEK SMARTVIEW test strip  Generic drug:  glucose blood     acetaminophen 500 MG tablet  Commonly known  as:  TYLENOL  Take 1,000 mg by mouth every 8 (eight) hours as needed (pain).     aspirin EC 81 MG tablet  Take 81 mg by mouth daily.     cilostazol 100 MG tablet  Commonly known as:  PLETAL  Take 100 mg by mouth daily.     fenofibrate 160 MG tablet  Take 160 mg by mouth daily.     ferrous sulfate 325 (65 FE) MG tablet  Take 1 tablet (325 mg total) by mouth 3 (three) times daily with meals.     glimepiride 1 MG tablet  Commonly known as:  AMARYL  Take 1 mg by mouth daily with breakfast.     metFORMIN 500 MG tablet  Commonly known as:  GLUCOPHAGE  Take 1 tablet (500 mg total) by mouth 3 (three) times daily.     olmesartan 40 MG tablet  Commonly known as:  BENICAR  Take 20 mg by mouth daily.     oxyCODONE 5 MG immediate release tablet  Commonly known as:  ROXICODONE  Take 1 tablet (5 mg total) by mouth every 6 (six) hours as needed.     rosuvastatin 40 MG tablet  Commonly known as:  CRESTOR  Take 20 mg by mouth daily. For cholesterol     zolpidem 10 MG tablet  Commonly known as:  AMBIEN  Take 10 mg by mouth at bedtime as needed.        Prescriptions given: 1.  Roxicodone #30 No Refill 2.  Ferrous sulfate '325mg'$  tid #90 3RF 3.  Colace daily  Instructions: 1.  Wash the groin wound with soap and water daily and pat dry. (No  tub bath-only shower)  Then put a dry gauze or washcloth there to keep this area dry daily and as needed.  Do not use Vaseline or neosporin on your incisions.  Only use soap and water on your incisions and then protect and keep dry.  Disposition: home  Patient's condition: is Good  Follow up: 1. Dr. Scot Dock in 2 weeks   Leontine Locket, PA-C [facility] [phone] 05/20/2015  8:02 AM  - For VQI Registry use --- Instructions: Press F2 to tab through selections.  Delete question if not applicable.   Post-op:  Wound infection: No  Graft infection: No  Transfusion: Yes  If yes, 1 units given New Arrhythmia: No Ipsilateral amputation: No, '[ ]'$  Minor, '[ ]'$  BKA, '[ ]'$  AKA Discharge patency: [ x] Primary, '[ ]'$  Primary assisted, '[ ]'$  Secondary, '[ ]'$  Occluded Patency judged by: '[ ]'$  Dopper only, '[ ]'$  Palpable graft pulse, [x ] Palpable distal pulse, '[ ]'$  ABI inc. > 0.15, '[ ]'$  Duplex Discharge ABI: R not done, L  Discharge TBI: R , L  D/C Ambulatory Status: Ambulatory  Complications: MI: No, '[ ]'$  Troponin only, '[ ]'$  EKG or Clinical CHF: No Resp failure:No, '[ ]'$  Pneumonia, '[ ]'$  Ventilator Chg in renal function: No, '[ ]'$  Inc. Cr > 0.5, '[ ]'$  Temp. Dialysis, '[ ]'$  Permanent dialysis Stroke: No, '[ ]'$  Minor, '[ ]'$  Major Return to OR: No  Reason for return to OR: '[ ]'$  Bleeding, '[ ]'$  Infection, '[ ]'$  Thrombosis, '[ ]'$  Revision  Discharge medications: Statin use:  yes ASA use:  yes Plavix use:  no Beta blocker use: no Coumadin use: no

## 2015-05-20 NOTE — Progress Notes (Signed)
Pt discharged home.  Alert and oriented x4.  No c/o pain.  Education given on diet, activity, meds, and follow-up care and appointments as well as dressing changes.  Pt  Verbalized understanding.  Pt hemodynamically stable with Hbg of 9.0 prior to discharge.  Pt was taken home by wife.  All incisions were dry/intact.  Iv and tele were discontinued.

## 2015-05-21 LAB — TYPE AND SCREEN
ABO/RH(D): O POS
Antibody Screen: NEGATIVE
Unit division: 0

## 2015-05-22 ENCOUNTER — Telehealth: Payer: Self-pay | Admitting: Vascular Surgery

## 2015-05-22 NOTE — Telephone Encounter (Signed)
LM for pt re appt, dpm °

## 2015-05-22 NOTE — Telephone Encounter (Signed)
-----   Message from Gabriel Earing, [place] sent at 05/20/2015  7:59 AM EDT ----- S/p right fem pop.  Fu with Dr.  Scot Dock in 2 weeks.  Thanks, Aldona Bar

## 2015-05-28 ENCOUNTER — Telehealth: Payer: Self-pay

## 2015-05-28 NOTE — Telephone Encounter (Signed)
Pt. Left voice message on Triage line requesting a call back to discuss swelling of feet and legs.  Attempted to return call to pt. '@t'$  12:40 PM.  Left voice message for pt. to return call to office.

## 2015-05-28 NOTE — Telephone Encounter (Signed)
Rec'd return call from pt.  Reported he continues to have swelling in the right foot and leg;  Questions if this is normal?  Stated the right leg swelling improves when he lays down.  Denied any warmth or redness in right leg.  Denied any fever or chills.  Reported "the incisions look good; like they are healing."  Stated he has been "doing a lot of walking."  Encouraged to continue walking, and to lay down with right leg elevated above level of heart several times /day for 30-45 min. each time.  Advised that leg swelling is normal following leg bypass, and that it will take time to resolve.  Encouraged to continue to monitor, and call if symptoms worsen or don't improve.  Has f/u appt. With Dr. Scot Dock 06/06/15.  Pt. stated he was aware of f/u appt.

## 2015-06-01 ENCOUNTER — Ambulatory Visit: Payer: 59 | Admitting: Vascular Surgery

## 2015-06-01 ENCOUNTER — Other Ambulatory Visit (HOSPITAL_COMMUNITY): Payer: 59

## 2015-06-04 ENCOUNTER — Encounter: Payer: Self-pay | Admitting: Vascular Surgery

## 2015-06-06 ENCOUNTER — Encounter: Payer: Self-pay | Admitting: Vascular Surgery

## 2015-06-06 ENCOUNTER — Ambulatory Visit (INDEPENDENT_AMBULATORY_CARE_PROVIDER_SITE_OTHER): Payer: 59 | Admitting: Vascular Surgery

## 2015-06-06 VITALS — BP 127/77 | HR 88 | Ht 73.0 in | Wt 219.0 lb

## 2015-06-06 DIAGNOSIS — I739 Peripheral vascular disease, unspecified: Secondary | ICD-10-CM

## 2015-06-06 DIAGNOSIS — Z95828 Presence of other vascular implants and grafts: Secondary | ICD-10-CM

## 2015-06-06 MED ORDER — OXYCODONE HCL 5 MG PO TABS: 5.0000 mg | ORAL_TABLET | Freq: Four times a day (QID) | ORAL | Status: DC | PRN

## 2015-06-06 NOTE — Progress Notes (Signed)
    Postoperative Visit   History of Present Illness  Jesse Avila is a 64 y.o. year old male who presents for postoperative follow-up for: extensive right iliofemoral endarterectomy and right femoral to below-knee popliteal bypass with PTFE 05/17/15. The patient's wounds are nearly healed.  The patient denies any further right calf claudication. He continues to have left leg claudication, but this is tolerable. He has been elevating his legs.   For VQI Use Only  PRE-ADM LIVING: Home  AMB STATUS: Ambulatory  Physical Examination  Filed Vitals:   06/06/15 1243  BP: 127/77  Pulse: 88   RLE: Incisions are nearly healed, right leg swelling, brisk doppler flow right DP and PT  Medical Decision Making  Jesse Avila is a 64 y.o. year old male who presents s/p extensive right iliofemoral endarterectomy and right femoral to below-knee popliteal bypass with PTFE 05/17/15   The patient's bypass incisions are healing appropriately with resolution of pre-operative symptoms.   His left leg claudication symptoms are stable. Will discuss plan with left leg once stable from recent surgery.   Continue leg elevation for swelling.   He will follow-up in 3 months with ABIs, bypass graft duplex and carotid duplex studies (s/p right CEA 05/09/14 by Dr. Bridgett Larsson.)  He is currently on ASA and a statin. He is not a smoker.   Oxycodone refilled #30.   Virgina Jock, PA-C [facility] [place] Office: [phone]  06/06/2015, 1:31 PM  This patient was seen and examined in conjunction with Dr. Scot Dock.

## 2015-06-08 ENCOUNTER — Encounter (HOSPITAL_COMMUNITY): Payer: 59

## 2015-06-08 ENCOUNTER — Ambulatory Visit: Payer: 59 | Admitting: Vascular Surgery

## 2015-06-22 ENCOUNTER — Ambulatory Visit: Payer: 59 | Admitting: Vascular Surgery

## 2015-06-22 ENCOUNTER — Encounter (HOSPITAL_COMMUNITY): Payer: 59

## 2015-09-07 ENCOUNTER — Encounter: Payer: Self-pay | Admitting: Vascular Surgery

## 2015-09-12 ENCOUNTER — Ambulatory Visit (HOSPITAL_COMMUNITY)
Admission: RE | Admit: 2015-09-12 | Discharge: 2015-09-12 | Disposition: A | Discharge status: Home / Self Care | Payer: BLUE CROSS/BLUE SHIELD | Source: Ambulatory Visit | Attending: Vascular Surgery | Admitting: Vascular Surgery

## 2015-09-12 ENCOUNTER — Ambulatory Visit (INDEPENDENT_AMBULATORY_CARE_PROVIDER_SITE_OTHER)
Admission: RE | Admit: 2015-09-12 | Discharge: 2015-09-12 | Disposition: A | Discharge status: Home / Self Care | Payer: BLUE CROSS/BLUE SHIELD | Source: Ambulatory Visit | Attending: Vascular Surgery | Admitting: Vascular Surgery

## 2015-09-12 ENCOUNTER — Encounter: Payer: Self-pay | Admitting: Vascular Surgery

## 2015-09-12 ENCOUNTER — Ambulatory Visit (INDEPENDENT_AMBULATORY_CARE_PROVIDER_SITE_OTHER): Payer: BLUE CROSS/BLUE SHIELD | Admitting: Vascular Surgery

## 2015-09-12 VITALS — BP 139/78 | HR 98 | Ht 73.0 in | Wt 223.2 lb

## 2015-09-12 DIAGNOSIS — I739 Peripheral vascular disease, unspecified: Secondary | ICD-10-CM

## 2015-09-12 DIAGNOSIS — I6522 Occlusion and stenosis of left carotid artery: Secondary | ICD-10-CM | POA: Diagnosis not present

## 2015-09-12 DIAGNOSIS — T82898A Other specified complication of vascular prosthetic devices, implants and grafts, initial encounter: Secondary | ICD-10-CM | POA: Diagnosis not present

## 2015-09-12 DIAGNOSIS — Z95828 Presence of other vascular implants and grafts: Secondary | ICD-10-CM | POA: Diagnosis not present

## 2015-09-12 DIAGNOSIS — Y832 Surgical operation with anastomosis, bypass or graft as the cause of abnormal reaction of the patient, or of later complication, without mention of misadventure at the time of the procedure: Secondary | ICD-10-CM | POA: Insufficient documentation

## 2015-09-12 DIAGNOSIS — E119 Type 2 diabetes mellitus without complications: Secondary | ICD-10-CM | POA: Insufficient documentation

## 2015-09-12 DIAGNOSIS — E785 Hyperlipidemia, unspecified: Secondary | ICD-10-CM | POA: Insufficient documentation

## 2015-09-12 DIAGNOSIS — Z48812 Encounter for surgical aftercare following surgery on the circulatory system: Secondary | ICD-10-CM

## 2015-09-12 DIAGNOSIS — I10 Essential (primary) hypertension: Secondary | ICD-10-CM | POA: Diagnosis not present

## 2015-09-12 DIAGNOSIS — R938 Abnormal findings on diagnostic imaging of other specified body structures: Secondary | ICD-10-CM | POA: Diagnosis not present

## 2015-09-12 NOTE — Addendum Note (Signed)
Addended by: Dorthula Rue L on: 09/12/2015 05:33 PM   Modules accepted: Orders

## 2015-09-12 NOTE — Progress Notes (Signed)
[facility]  Patient name: Jesse Avila MRN: [id number] DOB: Feb 19, 1951 Sex: male  REASON FOR VISIT: Follow up after right femoropopliteal bypass  HPI: Jesse Avila is a 65 y.o. male who had presented with progressive ischemia of the right lower extremity and severe rest pain. He had multilevel arterial occlusive disease. It was felt that his best chance for limb salvage was attempted revascularization. On 05/17/2015, he underwent extensive right iliofemoral endarterectomy with vein patch angioplasty and a right femoral to below knee popliteal artery bypass with a 6 mm PTFE graft. The great saphenous vein on the right was not adequate and therefore prosthetic graft had to be used or his bypass. In addition he had severe tibial artery occlusive disease with single-vessel runoff via a diseased peroneal artery. The graft was tunneled superficially as there was some venous bleeding from the subfascial tunneling site. He comes in first first outpatient visit.  He denies any change in his symptoms in his right leg. He has been ambulating without significant claudication. He denies rest pain. He denies nonhealing ulcers. He's had mild leg swelling which is not changed. He denies any acute change in his symptoms.  He is not a smoker. He is on aspirin and is on a statin.  Past Medical History  Diagnosis Date  . Hyperlipidemia     takes Fenofibrate and Crestor daily  . Peripheral vascular disease ([place])   . DJD (degenerative joint disease) of knee   . Carotid artery occlusion   . Essential hypertension, benign     takes Benicar daily  . 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 Insomnia     takes Ambien nightly as needed  . Joint pain   . History of gout   . History of colon polyps     benign  . Nocturia   . Primary localized osteoarthritis of left knee  12/26/2014  . Stroke [facility]) 2015?    takes Plavix daily as well as Pletal,not on plavix at present 05/15/15    Family History  Problem Relation Age of Onset  . Diabetes Mother   . Hypertension Mother   . Heart disease Mother     Coronary Artery Bypass Graft  . Hyperlipidemia Mother   . Heart attack Mother   . Hypertension Father   . Hyperlipidemia Sister   . Stroke Sister     SOCIAL HISTORY: Social History  Substance Use Topics  . Smoking status: Former Smoker -- 0.00 packs/day for 0 years  . Smokeless tobacco: Never Used     Comment: quit smoking 4 yrs ago  . Alcohol Use: 0.0 oz/week    0 Standard drinks or equivalent per week     Comment: beer- occasionally    Allergies  Allergen Reactions  . Cialis [Tadalafil] Other (See Comments)    Headache    Current Outpatient Prescriptions  Medication Sig Dispense Refill  . ACCU-CHEK SMARTVIEW test strip     . acetaminophen (TYLENOL) 500 MG tablet Take 1,000 mg by mouth every 8 (eight) hours as needed (pain).    Marland Kitchen aspirin EC 81 MG tablet Take 81 mg by mouth daily.    . cilostazol (PLETAL) 100 MG tablet Take 100 mg by mouth daily.     Marland Kitchen docusate sodium (COLACE) 100 MG capsule Take 1 capsule (100 mg total) by mouth daily.    Marland Kitchen  fenofibrate 160 MG tablet Take 160 mg by mouth daily.    . ferrous sulfate 325 (65 FE) MG tablet Take 1 tablet (325 mg total) by mouth 3 (three) times daily with meals. 90 tablet 3  . glimepiride (AMARYL) 1 MG tablet Take 1 mg by mouth daily with breakfast.     . metFORMIN (GLUCOPHAGE) 500 MG tablet Take 1 tablet (500 mg total) by mouth 3 (three) times daily.    Marland Kitchen olmesartan (BENICAR) 40 MG tablet Take 20 mg by mouth daily.     . rosuvastatin (CRESTOR) 40 MG tablet Take 20 mg by mouth daily. For cholesterol    . zolpidem (AMBIEN) 10 MG tablet Take 10 mg by mouth at bedtime as needed.     Marland Kitchen oxyCODONE (ROXICODONE) 5 MG immediate release tablet Take 1 tablet (5 mg total) by mouth every 6 (six) hours as needed.  (Patient not taking: Reported on 09/12/2015) 30 tablet 0   No current facility-administered medications for this visit.    REVIEW OF SYSTEMS:  '[X]'$  denotes positive finding, '[ ]'$  denotes negative finding Cardiac  Comments:  Chest pain or chest pressure:    Shortness of breath upon exertion:    Short of breath when lying flat:    Irregular heart rhythm:        Vascular    Pain in calf, thigh, or hip brought on by ambulation: X Bilateral calf  Pain in feet at night that wakes you up from your sleep:     Blood clot in your veins:    Leg swelling:         Pulmonary    Oxygen at home:    Productive cough:     Wheezing:         Neurologic    Sudden weakness in arms or legs:     Sudden numbness in arms or legs:     Sudden onset of difficulty speaking or slurred speech:    Temporary loss of vision in one eye:     Problems with dizziness:         Gastrointestinal    Blood in stool:     Vomited blood:         Genitourinary    Burning when urinating:     Blood in urine:        Psychiatric    Major depression:         Hematologic    Bleeding problems:    Problems with blood clotting too easily:        Skin    Rashes or ulcers:        Constitutional    Fever or chills:      PHYSICAL EXAM: Filed Vitals:   09/12/15 1108 09/12/15 1109  BP: 139/78 139/78  Pulse: 98   Height: '6\' 1"'$  (1.854 m)   Weight: 223 lb 3.2 oz (101.243 kg)   SpO2: 91%     GENERAL: The patient is a well-nourished male, in no acute distress. The vital signs are documented above. CARDIAC: There is a regular rate and rhythm.  VASCULAR: I did not detect carotid bruits. He has palpable femoral pulses. I cannot palpate pedal pulses. PULMONARY: There is good air exchange bilaterally without wheezing or rales. ABDOMEN: Soft and non-tender with normal pitched bowel sounds.  MUSCULOSKELETAL: There are no major deformities or cyanosis. NEUROLOGIC: No focal weakness or paresthesias are detected. SKIN: There  are no ulcers or rashes noted. PSYCHIATRIC: The patient has a normal  affect.  DATA:   ARTERIAL DOPPLER STUDY: I have independently interpreted his arterial Doppler study today which shows monophasic Doppler signals in the right foot with an ABI of 49%. 2 pressure in the right is 21 mmHg. On the left side ABI is 53% with monophasic Doppler signals in the left foot.  DUPLEX OF RIGHT FEMOROPOPLITEAL BYPASS GRAFT: I have independently interpreted his duplex of his right femoropopliteal bypass graft which shows that his graft is occluded.  CAROTID DUPLEX: I have independently interpreted his carotid duplex scan which shows a patent right carotid endarterectomy site. On the left side he has a 60-79% stenosis.  MEDICAL ISSUES:  STATUS POST RIGHT LOWER EXTREMITY BYPASS:  His right femoropopliteal bypass graft is occluded. I cannot tell when the graft occluded as this occluded silently. However his circulation is adequate with an ABI of 49%. He did undergo extensive iliofemoral endarterectomy on the right with vein patch angioplasty site think his circulation is adequate despite the occluded femoropopliteal bypass graft. He is not a smoker. I've encouraged him to stay as active as possible. He will stay on his aspirin and statin. I've ordered follow up ABIs in 6 months.  STATUS POST RIGHT CAROTID ENDARTERECTOMY: His right carotid endarterectomy site is widely patent. He has a 60-79% left carotid stenosis. I've ordered a follow up carotid duplex scan in 6 months and I'll see him back at that time. We would only consider left carotid endarterectomy if he became symptomatic with a stenosis progressed to greater than 80%.   Deitra Mayo [facility] [place]: [phone]

## 2016-01-16 ENCOUNTER — Encounter: Payer: Self-pay | Admitting: Cardiology

## 2016-03-13 ENCOUNTER — Encounter: Payer: Self-pay | Admitting: Vascular Surgery

## 2016-03-19 ENCOUNTER — Ambulatory Visit (HOSPITAL_COMMUNITY): Payer: BLUE CROSS/BLUE SHIELD

## 2016-03-19 ENCOUNTER — Ambulatory Visit: Payer: BLUE CROSS/BLUE SHIELD | Admitting: Vascular Surgery

## 2016-05-15 ENCOUNTER — Encounter: Payer: Self-pay | Admitting: Vascular Surgery

## 2016-05-21 ENCOUNTER — Encounter (HOSPITAL_COMMUNITY): Payer: BLUE CROSS/BLUE SHIELD

## 2016-05-21 ENCOUNTER — Ambulatory Visit: Payer: BLUE CROSS/BLUE SHIELD | Admitting: Vascular Surgery

## 2016-09-01 ENCOUNTER — Other Ambulatory Visit: Payer: Self-pay | Admitting: Family Medicine

## 2016-09-01 ENCOUNTER — Encounter: Payer: Self-pay | Admitting: Vascular Surgery

## 2016-09-01 ENCOUNTER — Ambulatory Visit
Admission: RE | Admit: 2016-09-01 | Discharge: 2016-09-01 | Disposition: A | Discharge status: Home / Self Care | Payer: BLUE CROSS/BLUE SHIELD | Source: Ambulatory Visit | Attending: Family Medicine | Admitting: Family Medicine

## 2016-09-01 DIAGNOSIS — R05 Cough: Secondary | ICD-10-CM

## 2016-09-01 DIAGNOSIS — R059 Cough, unspecified: Secondary | ICD-10-CM

## 2016-09-01 DIAGNOSIS — R9389 Abnormal findings on diagnostic imaging of other specified body structures: Secondary | ICD-10-CM

## 2016-09-01 DIAGNOSIS — R490 Dysphonia: Secondary | ICD-10-CM

## 2016-09-02 ENCOUNTER — Ambulatory Visit
Admission: RE | Admit: 2016-09-02 | Discharge: 2016-09-02 | Disposition: A | Discharge status: Home / Self Care | Payer: BLUE CROSS/BLUE SHIELD | Source: Ambulatory Visit | Attending: Family Medicine | Admitting: Family Medicine

## 2016-09-02 DIAGNOSIS — R9389 Abnormal findings on diagnostic imaging of other specified body structures: Secondary | ICD-10-CM

## 2016-09-02 MED ORDER — IOPAMIDOL (ISOVUE-370) INJECTION 76%
80.0000 mL | Freq: Once | INTRAVENOUS | Status: AC | PRN
  Administered 2016-09-02: 80 mL via INTRAVENOUS

## 2016-09-03 ENCOUNTER — Ambulatory Visit (INDEPENDENT_AMBULATORY_CARE_PROVIDER_SITE_OTHER): Payer: BLUE CROSS/BLUE SHIELD | Admitting: Pulmonary Disease

## 2016-09-03 ENCOUNTER — Encounter (HOSPITAL_COMMUNITY): Payer: Self-pay

## 2016-09-03 ENCOUNTER — Ambulatory Visit: Payer: BLUE CROSS/BLUE SHIELD | Admitting: Vascular Surgery

## 2016-09-03 ENCOUNTER — Telehealth: Payer: Self-pay | Admitting: Vascular Surgery

## 2016-09-03 ENCOUNTER — Encounter: Payer: Self-pay | Admitting: Pulmonary Disease

## 2016-09-03 ENCOUNTER — Ambulatory Visit (HOSPITAL_COMMUNITY)
Admission: RE | Admit: 2016-09-03 | Discharge: 2016-09-03 | Disposition: A | Discharge status: Home / Self Care | Payer: BLUE CROSS/BLUE SHIELD | Source: Ambulatory Visit | Attending: Vascular Surgery | Admitting: Vascular Surgery

## 2016-09-03 ENCOUNTER — Other Ambulatory Visit: Payer: Self-pay | Admitting: Pulmonary Disease

## 2016-09-03 DIAGNOSIS — Z48812 Encounter for surgical aftercare following surgery on the circulatory system: Secondary | ICD-10-CM

## 2016-09-03 DIAGNOSIS — J449 Chronic obstructive pulmonary disease, unspecified: Secondary | ICD-10-CM

## 2016-09-03 DIAGNOSIS — R918 Other nonspecific abnormal finding of lung field: Secondary | ICD-10-CM

## 2016-09-03 NOTE — Assessment & Plan Note (Addendum)
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 It does seem to be invading the mediastinum and not sure that this would be resectable. We would need a PET scan to stage and look for spread outside the chest. There is some narrowing of the left upper lobe bronchus but it is not clear whether this is extrinsic compression or related to endobronchial infiltration. Hence would prefer to schedule endobronchial ultrasound with bronchoscopy to have the best means of obtaining a biopsy  PET scan will be scheduled Schedule breathing test We will schedule bronchoscopy with biopsy under anesthesia  The various options of biopsy including bronchoscopy, CT guided needle aspiration and surgical biopsy were discussed.The risks of each procedure including coughing, bleeding and the  chances of lung puncture requiring chest tube were discussed in great detail. The benefits & alternatives including serial follow up were also discussed.

## 2016-09-03 NOTE — Telephone Encounter (Signed)
Due to financial difficulties, the patient decided to cancel his appointment for today and move his appointment for the end of year. I took the liberty of calling his insurance company to review his benefits at his request and his deductible has not been met yet. Pt would like to come in once his deductible as been met later in the year.

## 2016-09-03 NOTE — Patient Instructions (Signed)
PET scan will be scheduled Schedule breathing test We will schedule bronchoscopy with biopsy under anesthesia

## 2016-09-03 NOTE — Assessment & Plan Note (Signed)
Full PFTs will be scheduled to evaluate potential for resectability given his long history of smoking

## 2016-09-03 NOTE — Progress Notes (Signed)
Subjective:    Patient ID: Jesse Avila, male    DOB: Oct 06, 1950, 66 y.o.   MRN: [id number]  HPI  Chief Complaint  Patient presents with  . Pulm Consult    CT scan showed a lung mass. Denies SOB or chest pain. But has been clearing throat a lot in the past 2 weeks.    66 year old heavy ex-smoker referred for evaluation of lung mass. He developed hoarseness of voice and so his PCP and underwent a chest x-ray which showed left upper lobe lung mass but could not be differentiated from an aortic aneurysm. 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 Calcified pleural plaques were noted along the diaphragmatic pleural surfaces bilaterally. Hence he is referred to [place].  He reports blood-tinged sputum about 2 episodes 2 days ago. This has not recurred since. He denies shortness of breath or weight loss.  He also has significant peripheral arterial disease and had several surgeries including vascular bypass and carotid endarterectomy. Has a history of diabetes and stroke about 3 years ago with residual mild weakness of his left hand   He smoked about 2 packs per day starting as a teenager until he quit in 2014. 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 He is now retired. His sister was diagnosed with lung cancer         Past Medical History:  Diagnosis Date  . Carotid artery occlusion   . DJD (degenerative joint disease) of knee   . Essential hypertension, benign    takes Benicar daily  . History of colon polyps    benign  . History of gout   . Hyperlipidemia    takes Fenofibrate and Crestor daily  . Insomnia    takes Ambien nightly as needed  . Joint pain   . Nocturia   . Peripheral vascular disease ([place])   . Primary localized osteoarthritis of left knee  12/26/2014  . Stroke [facility]) 2015?   takes Plavix daily as well as Pletal,not on plavix at present 05/15/15  . 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 ABDOMINAL AORTAGRAM N/A 03/29/2012   Procedure: ABDOMINAL Maxcine Ham;  Surgeon: Angelia Mould, MD;  Location: [facility] CATH LAB;  Service: Cardiovascular;  Laterality: N/A;  . COLONOSCOPY    . ENDARTERECTOMY Right 05/09/2014   Procedure: RIGHT CAROTID ENDARTERECTOMY WITH PATCH ANGIOPLASTY;  Surgeon: Conrad Alma, MD;  Location: [place];  Service: Vascular;  Laterality: Right;  . ENDARTERECTOMY FEMORAL Right 05/17/2015   Procedure: ENDARTERECTOMY FEMORAL;  Surgeon: Angelia Mould, MD;  Location: [place];  Service: Vascular;  Laterality: Right;  . FEMORAL-POPLITEAL BYPASS GRAFT Right 05/17/2015   Procedure: BYPASS GRAFT FEMORAL-BELOW KNEE POPLITEAL ARTERY, using gortex propaten graft 6 mm x 80 cm;  Surgeon: Angelia Mould, MD;  Location: [place];  Service: Vascular;  Laterality: Right;  . KNEE ARTHROSCOPY Right 11/2009  . LOWER EXTREMITY ANGIOGRAM Bilateral 03/23/2015   Procedure: Lower Extremity Angiogram;  Surgeon: Angelia Mould, MD;  Location: [place] CV LAB;  Service: Cardiovascular;  Laterality: Bilateral;  . MENISCUS REPAIR  11/2009  . PARTIAL KNEE ARTHROPLASTY Left  12/26/2014   Procedure: UNICOMPARTMENTAL KNEE;  Surgeon: Marchia Bond, MD;  Location: [place];  Service: Orthopedics;  Laterality: Left;  . PATCH ANGIOPLASTY Right 05/17/2015   Procedure: VEIN PATCH ANGIOPLASTY ILEOFEMORAL ARTERY;  Surgeon: Angelia Mould, MD;  Location: [place];  Service: Vascular;  Laterality: Right;  . PERCUTANEOUS STENT INTERVENTION N/A 05/10/2012   Procedure: PERCUTANEOUS STENT INTERVENTION;  Surgeon: Angelia Mould, MD;  Location: [facility] CATH LAB;  Service: Cardiovascular;   Laterality: N/A;  . PERIPHERAL VASCULAR CATHETERIZATION N/A 03/23/2015   Procedure: Abdominal Aortogram;  Surgeon: Angelia Mould, MD;  Location: [place] CV LAB;  Service: Cardiovascular;  Laterality: N/A;  . STENTS     PLACED IN ??BOTH LEGS   2013?    Allergies  Allergen Reactions  . Cialis [Tadalafil] Other (See Comments)    Headache    Social History   Social History  . Marital status: Married    Spouse name: N/A  . Number of children: N/A  . Years of education: N/A   Occupational History  . Not on file.   Social History Main Topics  . Smoking status: Former Smoker    Packs/day: 0.00    Years: 0.00  . Smokeless tobacco: Never Used     Comment: quit smoking 4 yrs ago  . Alcohol use 0.0 oz/week     Comment: beer- occasionally  . Drug use: No  . Sexual activity: No   Other Topics Concern  . Not on file   Social History Narrative  . No narrative on file      Family History  Problem Relation Age of Onset  . Diabetes Mother   . Hypertension Mother   . Heart disease Mother     Coronary Artery Bypass Graft  . Hyperlipidemia Mother   . Heart attack Mother   . Hypertension Father   . Hyperlipidemia Sister   . Stroke Sister     Review of Systems   Positive for blood-tinged sputum and tooth problems and hoarseness of voice  Constitutional: negative for anorexia, fevers and sweats  Eyes: negative for irritation, redness and visual disturbance  Ears, nose, mouth, throat, and face: negative for earaches, epistaxis, nasal congestion and sore throat  Respiratory: negative for  dyspnea on exertion, sputum and wheezing  Cardiovascular: negative for chest pain, dyspnea, lower extremity edema, orthopnea, palpitations and syncope  Gastrointestinal: negative for abdominal pain, constipation, diarrhea, melena, nausea and vomiting  Genitourinary:negative for dysuria, frequency and hematuria  Hematologic/lymphatic: negative for bleeding, easy bruising and  lymphadenopathy  Musculoskeletal:negative for arthralgias, muscle weakness and stiff joints  Neurological: negative for coordination problems, gait problems, headaches and weakness  Endocrine: negative for diabetic symptoms including polydipsia, polyuria and weight loss     Objective:   Physical Exam  Gen. Pleasant, tall,well-nourished, in no distress, normal affect ENT - no lesions, no post nasal drip Neck: No JVD, no thyromegaly, no carotid bruits Lungs: no use of accessory muscles, no dullness to percussion, clear without rales or rhonchi  Cardiovascular: Rhythm regular, heart sounds  normal, no murmurs or gallops, no peripheral edema Abdomen: soft and non-tender, no hepatosplenomegaly, BS normal. Musculoskeletal: No deformities, no cyanosis or clubbing Neuro:  alert, non focal       Assessment & Plan:

## 2016-09-08 ENCOUNTER — Telehealth: Payer: Self-pay | Admitting: Pulmonary Disease

## 2016-09-08 ENCOUNTER — Ambulatory Visit (HOSPITAL_COMMUNITY): Admit: 2016-09-08 | Payer: BLUE CROSS/BLUE SHIELD | Admitting: Pulmonary Disease

## 2016-09-08 ENCOUNTER — Encounter (HOSPITAL_COMMUNITY)
Admission: RE | Disposition: A | Discharge status: Home / Self Care | Payer: Self-pay | Source: Ambulatory Visit | Attending: Pulmonary Disease

## 2016-09-08 ENCOUNTER — Telehealth: Payer: Self-pay | Admitting: *Deleted

## 2016-09-08 ENCOUNTER — Encounter (HOSPITAL_COMMUNITY): Payer: Self-pay | Admitting: *Deleted

## 2016-09-08 ENCOUNTER — Ambulatory Visit (HOSPITAL_COMMUNITY)
Admission: RE | Admit: 2016-09-08 | Discharge: 2016-09-08 | Disposition: A | Discharge status: Home / Self Care | Payer: BLUE CROSS/BLUE SHIELD | Source: Ambulatory Visit | Attending: Pulmonary Disease | Admitting: Pulmonary Disease

## 2016-09-08 ENCOUNTER — Encounter (HOSPITAL_COMMUNITY): Payer: BLUE CROSS/BLUE SHIELD

## 2016-09-08 ENCOUNTER — Ambulatory Visit (HOSPITAL_COMMUNITY): Payer: BLUE CROSS/BLUE SHIELD

## 2016-09-08 ENCOUNTER — Ambulatory Visit (HOSPITAL_COMMUNITY): Payer: BLUE CROSS/BLUE SHIELD | Admitting: Anesthesiology

## 2016-09-08 ENCOUNTER — Encounter (HOSPITAL_COMMUNITY): Payer: Self-pay

## 2016-09-08 DIAGNOSIS — R590 Localized enlarged lymph nodes: Secondary | ICD-10-CM | POA: Diagnosis not present

## 2016-09-08 DIAGNOSIS — J449 Chronic obstructive pulmonary disease, unspecified: Secondary | ICD-10-CM | POA: Insufficient documentation

## 2016-09-08 DIAGNOSIS — C3412 Malignant neoplasm of upper lobe, left bronchus or lung: Secondary | ICD-10-CM | POA: Diagnosis not present

## 2016-09-08 DIAGNOSIS — E669 Obesity, unspecified: Secondary | ICD-10-CM | POA: Diagnosis not present

## 2016-09-08 DIAGNOSIS — I10 Essential (primary) hypertension: Secondary | ICD-10-CM | POA: Diagnosis not present

## 2016-09-08 DIAGNOSIS — Z7982 Long term (current) use of aspirin: Secondary | ICD-10-CM | POA: Diagnosis not present

## 2016-09-08 DIAGNOSIS — G4700 Insomnia, unspecified: Secondary | ICD-10-CM | POA: Insufficient documentation

## 2016-09-08 DIAGNOSIS — Z801 Family history of malignant neoplasm of trachea, bronchus and lung: Secondary | ICD-10-CM | POA: Diagnosis not present

## 2016-09-08 DIAGNOSIS — E1151 Type 2 diabetes mellitus with diabetic peripheral angiopathy without gangrene: Secondary | ICD-10-CM | POA: Insufficient documentation

## 2016-09-08 DIAGNOSIS — Z79899 Other long term (current) drug therapy: Secondary | ICD-10-CM | POA: Diagnosis not present

## 2016-09-08 DIAGNOSIS — R918 Other nonspecific abnormal finding of lung field: Secondary | ICD-10-CM

## 2016-09-08 DIAGNOSIS — Z87891 Personal history of nicotine dependence: Secondary | ICD-10-CM | POA: Diagnosis not present

## 2016-09-08 DIAGNOSIS — Z95820 Peripheral vascular angioplasty status with implants and grafts: Secondary | ICD-10-CM | POA: Diagnosis not present

## 2016-09-08 DIAGNOSIS — Z955 Presence of coronary angioplasty implant and graft: Secondary | ICD-10-CM | POA: Insufficient documentation

## 2016-09-08 DIAGNOSIS — Z6830 Body mass index (BMI) 30.0-30.9, adult: Secondary | ICD-10-CM | POA: Insufficient documentation

## 2016-09-08 DIAGNOSIS — Z96652 Presence of left artificial knee joint: Secondary | ICD-10-CM | POA: Diagnosis not present

## 2016-09-08 DIAGNOSIS — Z8673 Personal history of transient ischemic attack (TIA), and cerebral infarction without residual deficits: Secondary | ICD-10-CM | POA: Diagnosis not present

## 2016-09-08 DIAGNOSIS — E785 Hyperlipidemia, unspecified: Secondary | ICD-10-CM | POA: Insufficient documentation

## 2016-09-08 DIAGNOSIS — Z7984 Long term (current) use of oral hypoglycemic drugs: Secondary | ICD-10-CM | POA: Diagnosis not present

## 2016-09-08 DIAGNOSIS — Z9889 Other specified postprocedural states: Secondary | ICD-10-CM

## 2016-09-08 HISTORY — PX: ENDOBRONCHIAL ULTRASOUND: SHX5096

## 2016-09-08 LAB — GLUCOSE, CAPILLARY: Glucose-Capillary: 162 mg/dL — ABNORMAL HIGH (ref 65–99)

## 2016-09-08 SURGERY — ENDOBRONCHIAL ULTRASOUND (EBUS)
Anesthesia: General | Laterality: Bilateral

## 2016-09-08 MED ORDER — REMIFENTANIL HCL 1 MG IV SOLR
INTRAVENOUS | Status: AC
  Filled 2016-09-08: qty 1000

## 2016-09-08 MED ORDER — REMIFENTANIL HCL 1 MG IV SOLR
INTRAVENOUS | Status: DC | PRN
  Administered 2016-09-08: .5 ug/kg/min via INTRAVENOUS

## 2016-09-08 MED ORDER — ONDANSETRON HCL 4 MG/2ML IJ SOLN
INTRAMUSCULAR | Status: DC | PRN
  Administered 2016-09-08: 4 mg via INTRAVENOUS

## 2016-09-08 MED ORDER — FENTANYL CITRATE (PF) 100 MCG/2ML IJ SOLN
INTRAMUSCULAR | Status: DC | PRN
Start: 2016-09-08 — End: 2016-09-08
  Administered 2016-09-08 (×4): 25 ug via INTRAVENOUS

## 2016-09-08 MED ORDER — PHENYLEPHRINE 40 MCG/ML (10ML) SYRINGE FOR IV PUSH (FOR BLOOD PRESSURE SUPPORT)
PREFILLED_SYRINGE | INTRAVENOUS | Status: DC | PRN
  Administered 2016-09-08 (×2): 40 ug via INTRAVENOUS
  Administered 2016-09-08: 80 ug via INTRAVENOUS

## 2016-09-08 MED ORDER — LIDOCAINE 2% (20 MG/ML) 5 ML SYRINGE
INTRAMUSCULAR | Status: DC | PRN
  Administered 2016-09-08: 50 mg via INTRAVENOUS

## 2016-09-08 MED ORDER — MIDAZOLAM HCL 2 MG/2ML IJ SOLN
INTRAMUSCULAR | Status: AC
  Filled 2016-09-08: qty 2

## 2016-09-08 MED ORDER — SODIUM CHLORIDE 0.9 % IJ SOLN
INTRAMUSCULAR | Status: AC
Start: 2016-09-08 — End: 2016-09-08
  Filled 2016-09-08: qty 40

## 2016-09-08 MED ORDER — PROPOFOL 10 MG/ML IV BOLUS
INTRAVENOUS | Status: AC
  Filled 2016-09-08: qty 20

## 2016-09-08 MED ORDER — SUCCINYLCHOLINE CHLORIDE 200 MG/10ML IV SOSY
PREFILLED_SYRINGE | INTRAVENOUS | Status: DC | PRN
  Administered 2016-09-08: 160 mg via INTRAVENOUS

## 2016-09-08 MED ORDER — MIDAZOLAM HCL 5 MG/5ML IJ SOLN
INTRAMUSCULAR | Status: DC | PRN
  Administered 2016-09-08: 2 mg via INTRAVENOUS

## 2016-09-08 MED ORDER — FENTANYL CITRATE (PF) 100 MCG/2ML IJ SOLN
INTRAMUSCULAR | Status: AC
  Filled 2016-09-08: qty 2

## 2016-09-08 MED ORDER — PROPOFOL 10 MG/ML IV BOLUS
INTRAVENOUS | Status: DC | PRN
  Administered 2016-09-08: 150 mg via INTRAVENOUS

## 2016-09-08 MED ORDER — LACTATED RINGERS IV SOLN
INTRAVENOUS | Status: DC
  Administered 2016-09-08: 08:00:00 via INTRAVENOUS

## 2016-09-08 NOTE — Progress Notes (Signed)
Video bronchoscopy performed post EBUS procedure. Intervention bronchial washings Intervention bronchial brushings Intervention bronchial biopsies Pt tolerated well  Kathie Dike RRT

## 2016-09-08 NOTE — Anesthesia Postprocedure Evaluation (Signed)
Anesthesia Post Note  Patient: Jesse Avila  Procedure(s) Performed: Procedure(s) (LRB): ENDOBRONCHIAL ULTRASOUND (Bilateral)  Patient location during evaluation: PACU Anesthesia Type: General Level of consciousness: awake and alert and patient cooperative Pain management: pain level controlled Vital Signs Assessment: post-procedure vital signs reviewed and stable Respiratory status: spontaneous breathing and respiratory function stable Cardiovascular status: stable Anesthetic complications: no       Last Vitals:  Vitals:   09/08/16 1010 09/08/16 1020  BP: (!) 184/92 (!) 188/93  Pulse: 85 81  Resp: (!) 22 (!) 22  Temp:      Last Pain:  Vitals:   09/08/16 0945  TempSrc: Oral                 Maila Dukes S

## 2016-09-08 NOTE — Op Note (Signed)
[facility] Cardiopulmonary Patient Name: Jesse Avila Procedure Date: 09/08/2016 MRN: [id number] Attending MD: Leanna Sato. Elsworth Soho MD, MD Date of Birth: [birth year] CSN: [id number] Age: 66 Admit Type: Outpatient Ethnicity: Not Hispanic or Latino Procedure:            Bronchoscopy Indications:          Left upper lobe mass, Left hilar mass, Hemoptysis with                        abnormal CXR Providers:            Leanna Sato. Elsworth Soho MD, MD, William Dalton, Technician Referring MD:          Medicines:            General Anesthesia Complications:        No immediate complications. Estimated blood loss:                        Minimal Estimated Blood Loss: Estimated blood loss was minimal. Estimated blood loss                        was minimal. Procedure:      Pre-Anesthesia Assessment:      - A History and Physical has been performed. Patient meds and allergies       have been reviewed. The risks and benefits of the procedure and the       sedation options and risks were discussed with the patient. All       questions were answered and informed consent was obtained. Patient       identification and proposed procedure were verified prior to the       procedure. Mental Status Examination: normal. Airway Examination: normal       oropharyngeal airway and Mallampati Class II (the uvula but not       tonsillar pillars visualized). Respiratory Examination: clear to       auscultation. CV Examination: RRR, no murmurs, no S3 or S4. ASA Grade       Assessment: II - A patient with mild systemic disease. After reviewing       the risks and benefits, the patient was deemed in satisfactory condition       to undergo the procedure. The anesthesia plan was to use general       anesthesia. Immediately prior to administration of medications, the       patient was re-assessed for adequacy to receive sedatives. The heart       rate, respiratory rate, oxygen saturations, blood pressure, adequacy of        pulmonary ventilation, and response to care were monitored throughout       the procedure. The physical status of the patient was re-assessed after       the procedure.      After obtaining informed consent, the bronchoscope was passed under       direct vision. Throughout the procedure, the patient's blood pressure,       pulse, and oxygen saturations were monitored continuously. the [id number]       ( [id number]) scope was introduced through the mouth, via the endotracheal       tube (the patient was intubated for the procedure) and advanced to the       tracheobronchial tree. The procedure was accomplished without difficulty. Findings:      Left  Lung Abnormalities: A partially obstructing mass was found in the       middle portion in the left upper lobe. The mass was bloody. The lesion       was not traversed. An endobronchial ultrasound endoscope was utilized in       order to assist with fine needle aspiration in the left upper lobe and       10 L location. Rapid On-Site Evaluation (ROSE): Preliminary cytology of       the lesion in the left upper lobe suggested the cellularity of the       specimen was adequate. Endobronchial biopsies were performed in the left       upper lobe using forceps and sent for histopathology examination. Two       samples were obtained. Brushings were obtained in the left upper lobe       and sent for routine cytology. BAL was performed in the left upper lobe       of the lung and sent for cell count, bacterial culture, viral smears &       culture, and fungal & AFB analysis and cytology. The return was bloody. Impression:      - Left upper lobe mass      - Left hilar mass      - Hemoptysis with abnormal CXR Moderate Sedation:      Moderate (conscious) sedation was personally administered by an       anesthesia professional. The following parameters were monitored: oxygen       saturation, heart rate, blood pressure, and response to  care. Recommendation:      - Await biopsy results.      - Refer to/consult with Oncology. Procedure Code(s):      --- Professional ---      [phone], Bronchoscopy, rigid or flexible, including fluoroscopic guidance,       when performed; with bronchial or endobronchial biopsy(s), single or       multiple sites      31624, Bronchoscopy, rigid or flexible, including fluoroscopic guidance,       when performed; with bronchial alveolar lavage      [phone], Bronchoscopy, rigid or flexible, including fluoroscopic guidance,       when performed; with brushing or protected brushings      31654, Bronchoscopy, rigid or flexible, including fluoroscopic guidance,       when performed; with transendoscopic endobronchial ultrasound (EBUS)       during bronchoscopic diagnostic or therapeutic intervention(s) for       peripheral lesion(s) (List separately in addition to code for primary       procedure[s]) Diagnosis Code(s):      --- Professional ---      R91.8, Other nonspecific abnormal finding of lung field      R04.2, Hemoptysis CPT copyright 2016 American Medical Association. All rights reserved. The codes documented in this report are preliminary and upon coder review may  be revised to meet current compliance requirements. Kara Mead, MD Leanna Sato Elsworth Soho MD, MD 09/08/2016 9:43:32 AM This report has been signed electronically. Number of Addenda: 0 Scope In: Scope Out:

## 2016-09-08 NOTE — Interval H&P Note (Signed)
History and Physical Interval Note:  09/08/2016 8:17 AM  Jesse Avila  has presented today for surgery, with the diagnosis of lung mass  The various methods of treatment have been discussed with the patient and family. After consideration of risks, benefits and other options for treatment, the patient has consented to  Procedure(s): ENDOBRONCHIAL ULTRASOUND (Bilateral) as a surgical intervention .  The patient's history has been reviewed, patient examined, no change in status, stable for surgery.  I have reviewed the patient's chart and labs.  Questions were answered to the patient's satisfaction.     ALVA,RAKESH V.

## 2016-09-08 NOTE — Telephone Encounter (Signed)
Oncology Nurse Navigator Documentation  Oncology Nurse Navigator Flowsheets 09/08/2016  Navigator Location [facility]  Referral date to RadOnc/MedOnc 09/08/2016  Navigator Encounter Type Telephone/I received referral on [name] today.  I called him to schedule for [place] on 09/18/16.  I was unable to reach him. I left vm message with my name and phone number to call.   Telephone Outgoing Call  Confirmed Diagnosis Date 09/08/2016  Treatment Phase Pre-Tx/Tx Discussion  Barriers/Navigation Needs Coordination of Care  Interventions Coordination of Care  Coordination of Care Appts  Acuity Level 2  Acuity Level 2 Assistance expediting appointments  Time Spent with Patient 15

## 2016-09-08 NOTE — Discharge Instructions (Signed)
General Anesthesia, Adult, Care After These instructions provide you with information about caring for yourself after your procedure. Your health care provider may also give you more specific instructions. Your treatment has been planned according to current medical practices, but problems sometimes occur. Call your health care provider if you have any problems or questions after your procedure. What can I expect after the procedure? After the procedure, it is common to have:  Vomiting.  A sore throat.  Mental slowness. It is common to feel:  Nauseous.  Cold or shivery.  Sleepy.  Tired.  Sore or achy, even in parts of your body where you did not have surgery. Follow these instructions at home: For at least 24 hours after the procedure:  Do not:  Participate in activities where you could fall or become injured.  Drive.  Use heavy machinery.  Drink alcohol.  Take sleeping pills or medicines that cause drowsiness.  Make important decisions or sign legal documents.  Take care of children on your own.  Rest. Eating and drinking  If you vomit, drink water, juice, or soup when you can drink without vomiting.  Drink enough fluid to keep your urine clear or pale yellow.  Make sure you have little or no nausea before eating solid foods.  Follow the diet recommended by your health care provider. General instructions  Have a responsible adult stay with you until you are awake and alert.  Return to your normal activities as told by your health care provider. Ask your health care provider what activities are safe for you.  Take over-the-counter and prescription medicines only as told by your health care provider.  If you smoke, do not smoke without supervision.  Keep all follow-up visits as told by your health care provider. This is important. Contact a health care provider if:  You continue to have nausea or vomiting at home, and medicines are not helpful.  You  cannot drink fluids or start eating again.  You cannot urinate after 8-12 hours.  You develop a skin rash.  You have fever.  You have increasing redness at the site of your procedure. Get help right away if:  You have difficulty breathing.  You have chest pain.  You have unexpected bleeding.  You feel that you are having a life-threatening or urgent problem. This information is not intended to replace advice given to you by your health care provider. Make sure you discuss any questions you have with your health care provider. Document Released: 10/27/2000 Document Revised: 12/24/2015 Document Reviewed: 07/05/2015 Elsevier Interactive Patient Education  2017 [employer].       Flexible Bronchoscopy, Care After These instructions give you information on caring for yourself after your procedure. Your doctor may also give you more specific instructions. Call your doctor if you have any problems or questions after your procedure. Follow these instructions at home:  Do not eat or drink anything for 2 hours after your procedure. If you try to eat or drink before the medicine wears off, food or drink could go into your lungs. You could also burn yourself.  After 2 hours have passed and when you can cough and gag normally, you may eat soft food and drink liquids slowly.  The day after the test, you may eat your normal diet.  You may do your normal activities.  Keep all doctor visits. Get help right away if:  You get more and more short of breath.  You get light-headed.  You feel like  you are going to pass out (faint).  You have chest pain.  You have new problems that worry you.  You cough up more than a little blood.  You cough up more blood than before. This information is not intended to replace advice given to you by your health care provider. Make sure you discuss any questions you have with your health care provider. Document Released: 05/18/2009 Document  Revised: 12/27/2015 Document Reviewed: 03/25/2013 Elsevier Interactive Patient Education  2017 [place] not eat or drink before 1130.

## 2016-09-08 NOTE — Anesthesia Procedure Notes (Addendum)
Procedure Name: Intubation Date/Time: 09/08/2016 8:37 AM Performed by: Anne Fu Pre-anesthesia Checklist: Patient identified, Emergency Drugs available, Suction available, Patient being monitored and Timeout performed Patient Re-evaluated:Patient Re-evaluated prior to inductionOxygen Delivery Method: Circle system utilized Preoxygenation: Pre-oxygenation with 100% oxygen Intubation Type: IV induction Ventilation: Mask ventilation without difficulty Laryngoscope Size: Mac and 4 Grade View: Grade I Tube type: Oral Tube size: 8.5 mm Number of attempts: 1 Airway Equipment and Method: Stylet Placement Confirmation: ETT inserted through vocal cords under direct vision,  positive ETCO2,  CO2 detector and breath sounds checked- equal and bilateral Secured at: 24 cm Tube secured with: Tape Dental Injury: Teeth and Oropharynx as per pre-operative assessment

## 2016-09-08 NOTE — Telephone Encounter (Signed)
Oncology Nurse Navigator Documentation  Oncology Nurse Navigator Flowsheets 09/08/2016  Navigator Location [facility]  Navigator Encounter Type Telephone/Jesse Avila called.  I gave him an appt for [place] on 09/18/16 arrive at 1:30.  He verbalized understanding of appt time and place.   Telephone Incoming Call  Treatment Phase Pre-Tx/Tx Discussion  Barriers/Navigation Needs Coordination of Care  Interventions Coordination of Care  Coordination of Care Appts  Acuity Level 1  Time Spent with Patient 15

## 2016-09-08 NOTE — Transfer of Care (Signed)
Immediate Anesthesia Transfer of Care Note  Patient: Jesse Avila  Procedure(s) Performed: Procedure(s): ENDOBRONCHIAL ULTRASOUND (Bilateral)  Patient Location: PACU  Anesthesia Type:General  Level of Consciousness:  sedated, patient cooperative and responds to stimulation  Airway & Oxygen Therapy:Patient Spontanous Breathing and Patient connected to face mask oxgen  Post-op Assessment:  Report given to PACU RN and Post -op Vital signs reviewed and stable  Post vital signs:  Reviewed and stable  Last Vitals:  Vitals:   09/08/16 0720  BP: (!) 162/79  Pulse: 92  Resp: 19  Temp: 15.5 C    Complications: No apparent anesthesia complications

## 2016-09-08 NOTE — Addendum Note (Signed)
Addendum  created 09/08/16 1613 by Anne Fu, CRNA   Anesthesia Intra Blocks edited, Sign clinical note

## 2016-09-08 NOTE — H&P (View-Only) (Signed)
Subjective:    Patient ID: Jesse Avila, male    DOB: 10/09/1950, 66 y.o.   MRN: [id number]  HPI  Chief Complaint  Patient presents with  . Pulm Consult    CT scan showed a lung mass. Denies SOB or chest pain. But has been clearing throat a lot in the past 2 weeks.    66 year old heavy ex-smoker referred for evaluation of lung mass. He developed hoarseness of voice and so his PCP and underwent a chest x-ray which showed left upper lobe lung mass but could not be differentiated from an aortic aneurysm. 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 Calcified pleural plaques were noted along the diaphragmatic pleural surfaces bilaterally. Hence he is referred to [place].  He reports blood-tinged sputum about 2 episodes 2 days ago. This has not recurred since. He denies shortness of breath or weight loss.  He also has significant peripheral arterial disease and had several surgeries including vascular bypass and carotid endarterectomy. Has a history of diabetes and stroke about 3 years ago with residual mild weakness of his left hand   He smoked about 2 packs per day starting as a teenager until he quit in 2014. 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 He is now retired. His sister was diagnosed with lung cancer         Past Medical History:  Diagnosis Date  . Carotid artery occlusion   . DJD (degenerative joint disease) of knee   . Essential hypertension, benign    takes Benicar daily  . History of colon polyps    benign  . History of gout   . Hyperlipidemia    takes Fenofibrate and Crestor daily  . Insomnia    takes Ambien nightly as needed  . Joint pain   . Nocturia   . Peripheral vascular disease ([place])   . Primary localized osteoarthritis of left knee  12/26/2014  . Stroke [facility]) 2015?   takes Plavix daily as well as Pletal,not on plavix at present 05/15/15  . 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 ABDOMINAL AORTAGRAM N/A 03/29/2012   Procedure: ABDOMINAL Maxcine Ham;  Surgeon: Angelia Mould, MD;  Location: [facility] CATH LAB;  Service: Cardiovascular;  Laterality: N/A;  . COLONOSCOPY    . ENDARTERECTOMY Right 05/09/2014   Procedure: RIGHT CAROTID ENDARTERECTOMY WITH PATCH ANGIOPLASTY;  Surgeon: Conrad Kenhorst, MD;  Location: [place];  Service: Vascular;  Laterality: Right;  . ENDARTERECTOMY FEMORAL Right 05/17/2015   Procedure: ENDARTERECTOMY FEMORAL;  Surgeon: Angelia Mould, MD;  Location: [place];  Service: Vascular;  Laterality: Right;  . FEMORAL-POPLITEAL BYPASS GRAFT Right 05/17/2015   Procedure: BYPASS GRAFT FEMORAL-BELOW KNEE POPLITEAL ARTERY, using gortex propaten graft 6 mm x 80 cm;  Surgeon: Angelia Mould, MD;  Location: [place];  Service: Vascular;  Laterality: Right;  . KNEE ARTHROSCOPY Right 11/2009  . LOWER EXTREMITY ANGIOGRAM Bilateral 03/23/2015   Procedure: Lower Extremity Angiogram;  Surgeon: Angelia Mould, MD;  Location: [place] CV LAB;  Service: Cardiovascular;  Laterality: Bilateral;  . MENISCUS REPAIR  11/2009  . PARTIAL KNEE ARTHROPLASTY Left  12/26/2014   Procedure: UNICOMPARTMENTAL KNEE;  Surgeon: Marchia Bond, MD;  Location: [place];  Service: Orthopedics;  Laterality: Left;  . PATCH ANGIOPLASTY Right 05/17/2015   Procedure: VEIN PATCH ANGIOPLASTY ILEOFEMORAL ARTERY;  Surgeon: Angelia Mould, MD;  Location: [place];  Service: Vascular;  Laterality: Right;  . PERCUTANEOUS STENT INTERVENTION N/A 05/10/2012   Procedure: PERCUTANEOUS STENT INTERVENTION;  Surgeon: Angelia Mould, MD;  Location: [facility] CATH LAB;  Service: Cardiovascular;   Laterality: N/A;  . PERIPHERAL VASCULAR CATHETERIZATION N/A 03/23/2015   Procedure: Abdominal Aortogram;  Surgeon: Angelia Mould, MD;  Location: [place] CV LAB;  Service: Cardiovascular;  Laterality: N/A;  . STENTS     PLACED IN ??BOTH LEGS   2013?    Allergies  Allergen Reactions  . Cialis [Tadalafil] Other (See Comments)    Headache    Social History   Social History  . Marital status: Married    Spouse name: N/A  . Number of children: N/A  . Years of education: N/A   Occupational History  . Not on file.   Social History Main Topics  . Smoking status: Former Smoker    Packs/day: 0.00    Years: 0.00  . Smokeless tobacco: Never Used     Comment: quit smoking 4 yrs ago  . Alcohol use 0.0 oz/week     Comment: beer- occasionally  . Drug use: No  . Sexual activity: No   Other Topics Concern  . Not on file   Social History Narrative  . No narrative on file      Family History  Problem Relation Age of Onset  . Diabetes Mother   . Hypertension Mother   . Heart disease Mother     Coronary Artery Bypass Graft  . Hyperlipidemia Mother   . Heart attack Mother   . Hypertension Father   . Hyperlipidemia Sister   . Stroke Sister     Review of Systems   Positive for blood-tinged sputum and tooth problems and hoarseness of voice  Constitutional: negative for anorexia, fevers and sweats  Eyes: negative for irritation, redness and visual disturbance  Ears, nose, mouth, throat, and face: negative for earaches, epistaxis, nasal congestion and sore throat  Respiratory: negative for  dyspnea on exertion, sputum and wheezing  Cardiovascular: negative for chest pain, dyspnea, lower extremity edema, orthopnea, palpitations and syncope  Gastrointestinal: negative for abdominal pain, constipation, diarrhea, melena, nausea and vomiting  Genitourinary:negative for dysuria, frequency and hematuria  Hematologic/lymphatic: negative for bleeding, easy bruising and  lymphadenopathy  Musculoskeletal:negative for arthralgias, muscle weakness and stiff joints  Neurological: negative for coordination problems, gait problems, headaches and weakness  Endocrine: negative for diabetic symptoms including polydipsia, polyuria and weight loss     Objective:   Physical Exam  Gen. Pleasant, tall,well-nourished, in no distress, normal affect ENT - no lesions, no post nasal drip Neck: No JVD, no thyromegaly, no carotid bruits Lungs: no use of accessory muscles, no dullness to percussion, clear without rales or rhonchi  Cardiovascular: Rhythm regular, heart sounds  normal, no murmurs or gallops, no peripheral edema Abdomen: soft and non-tender, no hepatosplenomegaly, BS normal. Musculoskeletal: No deformities, no cyanosis or clubbing Neuro:  alert, non focal       Assessment & Plan:

## 2016-09-08 NOTE — Anesthesia Preprocedure Evaluation (Signed)
Anesthesia Evaluation  Patient identified by MRN, date of birth, ID band Patient awake    Reviewed: Allergy & Precautions, H&P , NPO status , Patient's Chart, lab work & pertinent test results  Airway Mallampati: II   Neck ROM: full    Dental   Pulmonary COPD, former smoker,  Lung mass   breath sounds clear to auscultation       Cardiovascular hypertension, + Peripheral Vascular Disease   Rhythm:regular Rate:Normal     Neuro/Psych CVA    GI/Hepatic GERD  ,  Endo/Other  diabetes, Type 2obese  Renal/GU      Musculoskeletal  (+) Arthritis ,   Abdominal   Peds  Hematology   Anesthesia Other Findings   Reproductive/Obstetrics                             Anesthesia Physical Anesthesia Plan  ASA: III  Anesthesia Plan: General   Post-op Pain Management:    Induction: Intravenous  Airway Management Planned: Oral ETT  Additional Equipment:   Intra-op Plan:   Post-operative Plan: Extubation in OR  Informed Consent: I have reviewed the patients History and Physical, chart, labs and discussed the procedure including the risks, benefits and alternatives for the proposed anesthesia with the patient or authorized representative who has indicated his/her understanding and acceptance.     Plan Discussed with: CRNA, Anesthesiologist and Surgeon  Anesthesia Plan Comments:         Anesthesia Quick Evaluation

## 2016-09-08 NOTE — Telephone Encounter (Signed)
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 Julien Nordmann

## 2016-09-09 ENCOUNTER — Encounter (HOSPITAL_COMMUNITY): Payer: Self-pay | Admitting: Pulmonary Disease

## 2016-09-09 LAB — ACID FAST SMEAR (AFB, MYCOBACTERIA): Acid Fast Smear: NEGATIVE

## 2016-09-09 NOTE — Telephone Encounter (Signed)
Patient is scheduled for [place] this week on 09/11/16 and he is aware of appt

## 2016-09-10 ENCOUNTER — Telehealth: Payer: Self-pay | Admitting: Pulmonary Disease

## 2016-09-10 LAB — CULTURE, BAL-QUANTITATIVE W GRAM STAIN: Culture: NO GROWTH

## 2016-09-10 NOTE — Telephone Encounter (Signed)
Spoke with nurse at Dr. Ermelinda Das office. Dr. Avis Epley is wanting to speak with Dr. Elsworth Soho regarding pathology results on bronch that was done on 2.5.18. RA was paged but was not available. Will send to DOD, Dr. Chase Caller. Number to call is - [phone] to speak with Dr. Avis Epley.  MR please advise. THanks.

## 2016-09-11 NOTE — Telephone Encounter (Signed)
done

## 2016-09-12 ENCOUNTER — Encounter (HOSPITAL_COMMUNITY): Payer: BLUE CROSS/BLUE SHIELD

## 2016-09-12 ENCOUNTER — Telehealth: Payer: Self-pay | Admitting: *Deleted

## 2016-09-12 NOTE — Telephone Encounter (Signed)
Mailed [facility]OC letter to pt.  

## 2016-09-17 ENCOUNTER — Ambulatory Visit
Admission: RE | Admit: 2016-09-17 | Discharge: 2016-09-17 | Disposition: A | Discharge status: Home / Self Care | Payer: BLUE CROSS/BLUE SHIELD | Source: Ambulatory Visit | Attending: Pulmonary Disease | Admitting: Pulmonary Disease

## 2016-09-17 DIAGNOSIS — K760 Fatty (change of) liver, not elsewhere classified: Secondary | ICD-10-CM | POA: Diagnosis not present

## 2016-09-17 DIAGNOSIS — I517 Cardiomegaly: Secondary | ICD-10-CM | POA: Insufficient documentation

## 2016-09-17 DIAGNOSIS — K869 Disease of pancreas, unspecified: Secondary | ICD-10-CM | POA: Diagnosis not present

## 2016-09-17 DIAGNOSIS — M799 Soft tissue disorder, unspecified: Secondary | ICD-10-CM | POA: Diagnosis not present

## 2016-09-17 DIAGNOSIS — I2510 Atherosclerotic heart disease of native coronary artery without angina pectoris: Secondary | ICD-10-CM | POA: Diagnosis not present

## 2016-09-17 DIAGNOSIS — M899 Disorder of bone, unspecified: Secondary | ICD-10-CM | POA: Diagnosis not present

## 2016-09-17 DIAGNOSIS — K5730 Diverticulosis of large intestine without perforation or abscess without bleeding: Secondary | ICD-10-CM | POA: Insufficient documentation

## 2016-09-17 DIAGNOSIS — R918 Other nonspecific abnormal finding of lung field: Secondary | ICD-10-CM

## 2016-09-17 LAB — GLUCOSE, CAPILLARY: Glucose-Capillary: 145 mg/dL — ABNORMAL HIGH (ref 65–99)

## 2016-09-17 MED ORDER — FLUDEOXYGLUCOSE F - 18 (FDG) INJECTION
13.2200 | Freq: Once | INTRAVENOUS | Status: AC | PRN
  Administered 2016-09-17: 13.22 via INTRAVENOUS

## 2016-09-18 ENCOUNTER — Ambulatory Visit (HOSPITAL_BASED_OUTPATIENT_CLINIC_OR_DEPARTMENT_OTHER): Payer: BLUE CROSS/BLUE SHIELD | Admitting: Internal Medicine

## 2016-09-18 ENCOUNTER — Ambulatory Visit
Admission: RE | Admit: 2016-09-18 | Discharge: 2016-09-18 | Disposition: A | Discharge status: Home / Self Care | Payer: BLUE CROSS/BLUE SHIELD | Source: Ambulatory Visit | Attending: Radiation Oncology | Admitting: Radiation Oncology

## 2016-09-18 ENCOUNTER — Ambulatory Visit: Payer: BLUE CROSS/BLUE SHIELD | Admitting: Physical Therapy

## 2016-09-18 ENCOUNTER — Telehealth: Payer: Self-pay | Admitting: Internal Medicine

## 2016-09-18 ENCOUNTER — Encounter: Payer: Self-pay | Admitting: *Deleted

## 2016-09-18 ENCOUNTER — Encounter: Payer: Self-pay | Admitting: Internal Medicine

## 2016-09-18 ENCOUNTER — Other Ambulatory Visit (HOSPITAL_BASED_OUTPATIENT_CLINIC_OR_DEPARTMENT_OTHER): Payer: BLUE CROSS/BLUE SHIELD

## 2016-09-18 VITALS — BP 147/81 | HR 90 | Temp 98.1°F | Resp 18 | Ht 72.0 in | Wt 223.5 lb

## 2016-09-18 DIAGNOSIS — Z5111 Encounter for antineoplastic chemotherapy: Secondary | ICD-10-CM | POA: Insufficient documentation

## 2016-09-18 DIAGNOSIS — C3492 Malignant neoplasm of unspecified part of left bronchus or lung: Secondary | ICD-10-CM

## 2016-09-18 DIAGNOSIS — I10 Essential (primary) hypertension: Secondary | ICD-10-CM

## 2016-09-18 DIAGNOSIS — M899 Disorder of bone, unspecified: Secondary | ICD-10-CM

## 2016-09-18 DIAGNOSIS — Z7189 Other specified counseling: Secondary | ICD-10-CM

## 2016-09-18 DIAGNOSIS — I739 Peripheral vascular disease, unspecified: Secondary | ICD-10-CM

## 2016-09-18 DIAGNOSIS — C3412 Malignant neoplasm of upper lobe, left bronchus or lung: Secondary | ICD-10-CM | POA: Diagnosis not present

## 2016-09-18 DIAGNOSIS — R918 Other nonspecific abnormal finding of lung field: Secondary | ICD-10-CM

## 2016-09-18 DIAGNOSIS — E1169 Type 2 diabetes mellitus with other specified complication: Secondary | ICD-10-CM

## 2016-09-18 HISTORY — DX: Other specified counseling: Z71.89

## 2016-09-18 LAB — COMPREHENSIVE METABOLIC PANEL
ALT: 18 U/L (ref 0–55)
AST: 18 U/L (ref 5–34)
Albumin: 3.9 g/dL (ref 3.5–5.0)
Alkaline Phosphatase: 56 U/L (ref 40–150)
Anion Gap: 9 mEq/L (ref 3–11)
BUN: 10.4 mg/dL (ref 7.0–26.0)
CHLORIDE: 106 meq/L (ref 98–109)
CO2: 24 meq/L (ref 22–29)
CREATININE: 1.2 mg/dL (ref 0.7–1.3)
Calcium: 10 mg/dL (ref 8.4–10.4)
EGFR: 64 mL/min/{1.73_m2} — ABNORMAL LOW (ref >90)
Glucose: 187 mg/dl — ABNORMAL HIGH (ref 70–140)
Potassium: 3.8 mEq/L (ref 3.5–5.1)
Sodium: 138 mEq/L (ref 136–145)
Total Bilirubin: 0.42 mg/dL (ref 0.20–1.20)
Total Protein: 7.4 g/dL (ref 6.4–8.3)

## 2016-09-18 LAB — CBC WITH DIFFERENTIAL/PLATELET
BASO%: 0.5 % (ref 0.0–2.0)
Basophils Absolute: 0 10*3/uL (ref 0.0–0.1)
EOS%: 3.6 % (ref 0.0–7.0)
Eosinophils Absolute: 0.2 10*3/uL (ref 0.0–0.5)
HEMATOCRIT: 35.3 % — AB (ref 38.4–49.9)
HGB: 12.2 g/dL — ABNORMAL LOW (ref 13.0–17.1)
LYMPH#: 1.5 10*3/uL (ref 0.9–3.3)
LYMPH%: 26.2 % (ref 14.0–49.0)
MCH: 31.7 pg (ref 27.2–33.4)
MCHC: 34.6 g/dL (ref 32.0–36.0)
MCV: 91.7 fL (ref 79.3–98.0)
MONO#: 0.5 10*3/uL (ref 0.1–0.9)
MONO%: 8.3 % (ref 0.0–14.0)
NEUT#: 3.6 10*3/uL (ref 1.5–6.5)
NEUT%: 61.4 % (ref 39.0–75.0)
Platelets: 200 10*3/uL (ref 140–400)
RBC: 3.85 10*6/uL — ABNORMAL LOW (ref 4.20–5.82)
RDW: 12.9 % (ref 11.0–14.6)
WBC: 5.9 10*3/uL (ref 4.0–10.3)

## 2016-09-18 MED ORDER — PROCHLORPERAZINE MALEATE 10 MG PO TABS: 10.0000 mg | ORAL_TABLET | Freq: Four times a day (QID) | ORAL | 0 refills | Status: DC | PRN

## 2016-09-18 MED ORDER — LIDOCAINE-PRILOCAINE 2.5-2.5 % EX CREA: 1.0000 "application " | TOPICAL_CREAM | CUTANEOUS | 0 refills | Status: DC | PRN

## 2016-09-18 NOTE — Progress Notes (Signed)
[place] Clinical Social Work  Clinical Social Work met with patient/family and Futures trader at [facility] appointment to offer support and assess for psychosocial needs.  Medical oncologist reviewed patient's diagnosis and recommended treatment plan with patient/family.  Jesse Avila was accompanied by his spouse, daughter, and son-in-law.  Patient expressed he was eager to start treatment and only focus is on "getting better".  CSW shared information on how to communicate with their grandchildren regarding cancer illness.  Jesse Avila reported they are very close with daughter and her family.  CSW provided brief emotional support and will follow up with additional information on communication with family.  Clinical Social Work briefly discussed Clinical Social Work role and [employer] support programs/services.  Clinical Social Work encouraged patient to call with any additional questions or concerns.   Jesse Avila, MSW, LCSW, OSW-C Clinical Social Worker [facility] [phone]

## 2016-09-18 NOTE — Progress Notes (Signed)
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: Etoposide 100 mg/m2 Days 1, 2, 3 + Carboplatin AUC=5 Day 1 q21 Days x 4 Cycles   A cycle is every 21 days:     Etoposide (Toposar(R)) 100 mg/m2 in 500 mL NS IV over 2 hours days 1-3 Dose Mod: None     Carboplatin (Paraplatin(R)) AUC=5 in 500 mL NS IV over 1 hour day 1 only Dose Mod: None Additional Orders: * All AUC calculations intended to be used in [employer] formula  **Always confirm dose/schedule in your pharmacy ordering system**    Patient Characteristics: Extensive Stage, First Line Stage Grouping: Extensive AJCC T Category: T4 AJCC N Category: N2 AJCC M Category: M1c AJCC 8 Stage Grouping: IVB Line of therapy: First Line Would you be surprised if this patient died  in the next year? I would NOT be surprised if this patient died in the next year  Intent of Therapy: Non-Curative / Palliative Intent, Discussed with Patient

## 2016-09-18 NOTE — Telephone Encounter (Signed)
Appointments scheduled per 2/15 LOS. Patient given AVS report and calendars with future scheduled appointments.

## 2016-09-18 NOTE — Progress Notes (Signed)
Radiation Oncology         [phone] ________________________________  Multidisciplinary Thoracic Oncology Clinic [facility]) Initial Outpatient Consultation  Name: Jesse Avila MRN: [id number]  Date: 09/18/2016  DOB: 11-23-1950  QV:ZDGLOVF,IEPPIR R, MD  Rigoberto Noel, MD   REFERRING PHYSICIAN: Rigoberto Noel, MD  DIAGNOSIS: Limited versus "early" extensive stage small cell lung cancer (T4, N2, M1b)         ICD-9-CM ICD-10-CM   1. Small cell lung carcinoma, left (HCC) 162.9 C34.92     HISTORY OF PRESENT ILLNESS::Jesse Avila is a 66 y.o. male who originally presented to his primary care physician complaining of hoarseness of voice, hemoptysis, chest congestion and tightness. He underwent chest X-ray on 09/01/2016 which showed a left upper lobe lung mass but could not be differentiated from an aortic aneurysm. Accordingly CT chest was performed on 09/02/2016 and clarified this to be a 7.2 x 6.9 x 6.6 cm irregular mass in the left hilum invading the mediastinum and aorta pulmonary window with mild left upper lobe atelectasis and narrowing of the left upper lobe bronchus. Calcified pleural plaques were noted along the diaphragmatic pleural surfaces bilaterally.  Patient underwent bronchoscopy with Dr. Davonna Belling on 09/08/2016. Endobronchial biopsy of the LUL mass was positive for small cell carcinoma. Cytopathology 1 out of 4 was positive for small cell carcinoma. Cytopathology 2 out of 4 had atypical cells present.  PET on 09/17/2016 showed large left suprahilar mass invading the mediastinum with a max SUV of 15.2, appearance compatible with histologic diagnosis of small cell carcinoma.   There is an approximately 1.9 cm mass of the uncinate process of the pancreas which is hypermetabolic. This could represent a metastatic focus or a second primary cancer. There was ill-defined asymmetric focus of mildly increased metabolic activity in the left iliac bone near the SI joint, max SUV 5.3, with no CT  correlate.   The patient was referred today for presentation in the multidisciplinary conference.  Radiology studies and pathology slides were presented there for review and discussion of treatment options.  A consensus was discussed regarding potential next steps.  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    PAST SURGICAL HISTORY: Past Surgical History:  Procedure Laterality Date  . ABDOMINAL AORTAGRAM N/A 03/29/2012   Procedure: ABDOMINAL Maxcine Ham;  Surgeon: Angelia Mould, MD;  Location: [facility] CATH LAB;  Service: Cardiovascular;  Laterality: N/A;  . COLONOSCOPY    . ENDARTERECTOMY Right 05/09/2014   Procedure: RIGHT CAROTID ENDARTERECTOMY WITH PATCH ANGIOPLASTY;  Surgeon: Conrad Lynch, MD;  Location: [place];  Service: Vascular;  Laterality: Right;  . ENDARTERECTOMY FEMORAL Right 05/17/2015   Procedure: ENDARTERECTOMY FEMORAL;  Surgeon: Angelia Mould, MD;  Location: [place];  Service: Vascular;  Laterality: Right;  . ENDOBRONCHIAL ULTRASOUND Bilateral 09/08/2016   Procedure: ENDOBRONCHIAL ULTRASOUND;  Surgeon: Rigoberto Noel, MD;  Location: [facility] ENDOSCOPY;  Service: Cardiopulmonary;  Laterality: Bilateral;  . FEMORAL-POPLITEAL BYPASS GRAFT Right 05/17/2015   Procedure: BYPASS GRAFT FEMORAL-BELOW KNEE POPLITEAL ARTERY, using gortex propaten graft 6 mm x 80 cm;  Surgeon: Angelia Mould, MD;  Location: [facility] OR;  Service: Vascular;  Laterality: Right;  . KNEE ARTHROSCOPY Right  11/2009  . LOWER EXTREMITY ANGIOGRAM Bilateral 03/23/2015   Procedure: Lower Extremity Angiogram;  Surgeon: Angelia Mould, MD;  Location: [place] CV LAB;  Service: Cardiovascular;  Laterality: Bilateral;  . MENISCUS REPAIR  11/2009  . PARTIAL KNEE ARTHROPLASTY Left 12/26/2014   Procedure: UNICOMPARTMENTAL KNEE;  Surgeon: Marchia Bond, MD;  Location: [place];  Service: Orthopedics;  Laterality: Left;  . PATCH ANGIOPLASTY Right 05/17/2015   Procedure: VEIN PATCH ANGIOPLASTY ILEOFEMORAL ARTERY;  Surgeon: Angelia Mould, MD;  Location: [place];  Service: Vascular;  Laterality: Right;  . PERCUTANEOUS STENT INTERVENTION N/A 05/10/2012   Procedure: PERCUTANEOUS STENT INTERVENTION;  Surgeon: Angelia Mould, MD;  Location: [facility] CATH LAB;  Service: Cardiovascular;  Laterality: N/A;  . PERIPHERAL VASCULAR CATHETERIZATION N/A 03/23/2015   Procedure: Abdominal Aortogram;  Surgeon: Angelia Mould, MD;  Location: [place] CV LAB;  Service: Cardiovascular;  Laterality: N/A;  . STENTS     PLACED IN ??BOTH LEGS   2013?    FAMILY HISTORY: family history includes Diabetes in his mother; Heart attack in his mother; Heart disease in his mother; Hyperlipidemia in his mother and sister; Hypertension in his father and mother; Stroke in his sister.  SOCIAL HISTORY:  reports that he has quit smoking. He smoked 0.00 packs per day for 0.00 years. He has never used smokeless tobacco. He reports that he drinks alcohol. He reports that he does not use drugs.  ALLERGIES: Cialis [tadalafil]  MEDICATIONS:  Current Outpatient Prescriptions  Medication Sig Dispense Refill  . aspirin EC 81 MG tablet Take 81 mg by mouth daily.    . Blood Glucose Monitoring Suppl (ACCU-CHEK NANO SMARTVIEW) w/Device KIT by Does not apply route.    . cilostazol (PLETAL) 100 MG tablet Take 100 mg by mouth 2 (two) times daily.     . fenofibrate 160 MG tablet Take 160 mg by mouth daily.    Marland Kitchen glimepiride (AMARYL) 4 MG tablet  Take 4 mg by mouth daily with breakfast.    . lidocaine-prilocaine (EMLA) cream Apply 1 application topically as needed. 30 g 0  . metFORMIN (GLUCOPHAGE) 500 MG tablet Take 1 tablet (500 mg total) by mouth 3 (three) times daily.    . metoprolol succinate (TOPROL-XL) 50 MG 24 hr tablet Take 50 mg by mouth daily. Take with or immediately following a meal.    . pioglitazone (ACTOS) 15 MG tablet Take 15 mg by mouth daily.    . prochlorperazine (COMPAZINE) 10 MG tablet Take 1 tablet (10 mg total) by mouth every 6 (six) hours as needed for nausea or vomiting. 30 tablet 0  . rosuvastatin (CRESTOR) 40 MG tablet Take 20 mg by mouth daily. For cholesterol    . zolpidem (AMBIEN) 10 MG tablet Take 10 mg by mouth at bedtime as needed.      No current facility-administered medications for this encounter.     REVIEW OF SYSTEMS:  A 15 point review of systems is documented in the electronic medical record. This was obtained by the nursing staff. However, I reviewed this with the patient to discuss relevant findings and make appropriate changes.    He denies any shoulder or rotator cuff issues. He report mild residual weakness on the left side after a stroke, including numbness in the left arm, hand, and leg. He is still able to drive and do what he wants to do. He has stents in  his legs which cause poor circulation in his legs. He also had a left knee replacement. The patient lives in [place], [place] which is about 35 minutes from [place]. He is retired. He has claudication symptoms with walking.   PHYSICAL EXAM:  Vitals with BMI 09/18/2016  Height '6\' 0"'$   Weight 223 lbs 8 oz  BMI 62.8  Systolic 366  Diastolic 81  Pulse 90  Respirations 18    General: Alert and oriented, in no acute distress HEENT: Head is normocephalic. Extraocular movements are intact. Oropharynx is clear. Neck: Neck is supple, no palpable cervical or supraclavicular lymphadenopathy. Scar on right neck from prior carotid  endarterectomy. Heart: Regular in rate and rhythm with no murmurs, rubs, or gallops. Chest: Clear to auscultation bilaterally, with no rhonchi, wheezes, or rales. Abdomen: Soft, nontender, nondistended, with no rigidity or guarding. Extremities: No cyanosis or edema. Lymphatics: see Neck Exam Skin: No concerning lesions. Musculoskeletal: symmetric strength and muscle tone throughout. Neurologic: Cranial nerves II through XII are grossly intact. No obvious focalities. Speech is fluent. Coordination is intact. Psychiatric: Judgment and insight are intact. Affect is appropriate.   KPS = 80  100 - Normal; no complaints; no evidence of disease. 90   - Able to carry on normal activity; minor signs or symptoms of disease. 80   - Normal activity with effort; some signs or symptoms of disease. 67   - Cares for self; unable to carry on normal activity or to do active work. 60   - Requires occasional assistance, but is able to care for most of his personal needs. 50   - Requires considerable assistance and frequent medical care. 22   - Disabled; requires special care and assistance. 49   - Severely disabled; hospital admission is indicated although death not imminent. 12   - Very sick; hospital admission necessary; active supportive treatment necessary. 10   - Moribund; fatal processes progressing rapidly. 0     - Dead  Karnofsky DA, Abelmann Bruno, Craver LS and Burchenal [facility] [phone]) The use of the nitrogen mustards in the palliative treatment of carcinoma: with particular reference to bronchogenic carcinoma Cancer 1 634-56  LABORATORY DATA:  Lab Results  Component Value Date   WBC 5.9 09/18/2016   HGB 12.2 (L) 09/18/2016   HCT 35.3 (L) 09/18/2016   MCV 91.7 09/18/2016   PLT 200 09/18/2016   Lab Results  Component Value Date   NA 138 09/18/2016   K 3.8 09/18/2016   CL 103 05/19/2015   CO2 24 09/18/2016   Lab Results  Component Value Date   ALT 18 09/18/2016   AST 18 09/18/2016    ALKPHOS 56 09/18/2016   BILITOT 0.42 09/18/2016    PULMONARY FUNCTION TEST:  Pulmonary Functions Testing Results:  No results found for: FEV1, FVC, FEV1FVC, TLC, DLCO  RADIOGRAPHY: Dg Chest 2 View  Result Date: 09/01/2016 CLINICAL DATA:  Coughing up blood, chest congestion and tightness for 2 weeks. EXAM: CHEST  2 VIEW COMPARISON:  PA and lateral chest 05/08/2014. FINDINGS: The patient's aortic arch has a markedly abnormal configuration which is new since the prior examination and worrisome for aneurysm and possibly dissection. There is aortic atherosclerosis. Lungs are clear. Calcified pleural plaque right hemidiaphragm noted and unchanged. No pneumothorax. No acute bony abnormality. IMPRESSION: Abnormal appearance of the aortic arch is new since the prior examination worrisome for aneurysm and possibly dissection. Recommend CT angiogram of the aorta for further evaluation. These results will be called to the ordering  clinician or representative by the Radiologist Assistant, and communication documented in the PACS or zVision Dashboard. Electronically Signed   By: Inge Rise M.D.   On: 09/01/2016 14:50   Nm Pet Image Initial (pi) Skull Base To Thigh  Result Date: 09/17/2016 CLINICAL DATA:  Initial treatment strategy for left upper lobe lung mass. EXAM: NUCLEAR MEDICINE PET SKULL BASE TO THIGH TECHNIQUE: 13.2 mCi F-18 FDG was injected intravenously. Full-ring PET imaging was performed from the skull base to thigh after the radiotracer. CT data was obtained and used for attenuation correction and anatomic localization. FASTING BLOOD GLUCOSE:  Value: 145 mg/dl COMPARISON:  Multiple exams, including 09/02/2016 FINDINGS: NECK No hypermetabolic lymph nodes in the neck. Carotid atherosclerosis, left greater than right. CHEST Left suprahilar mass invading the mediastinum, AP window, and encasing the left pulmonary artery, left upper lobe bronchus, and wraparound parts of the left lower lobe bronchus and  left mainstem bronchus as shown previously, maximum SUV 15.2. Anterior lobulation could represent a separate lymph node or simply extension of the mass in the middle mediastinum. Postobstructive atelectasis and pneumonitis in the left upper lobe. No separate metastatic focus separated from the main mass is identified in the chest. Coronary, aortic arch, and branch vessel atherosclerotic vascular disease. Mild cardiomegaly. Bilateral calcified pleural plaques. ABDOMEN/PELVIS Abnormal 1.9 cm focus of hypermetabolic activity in the uncinate process of the pancreas, maximum SUV 9.6, favoring malignancy. Scattered physiologic activity in the bowel, more notably in the colon. Circumferential activity at the anorectal junction is likely physiologic. Aortoiliac atherosclerotic vascular disease. Sigmoid colon diverticulosis diffuse hepatic steatosis. Diffuse hepatic steatosis. SKELETON In the left iliac bone there is an ill-defined focus of metabolic activity with maximum SUV 5.3 without CT correlate. Contralateral activity in this vicinity in the normal iliac bone maximum SUV 2.5. Several other speckled areas of faint metabolic activity in the lumbar spine are likely incidental. IMPRESSION: 1. The large left suprahilar mass invading the mediastinum has a maximum SUV of 15.2, appearance compatible with histologic diagnosis of small cell carcinoma. No hypermetabolic adenopathy in the chest. 2. There is an approximately 1.9 cm mass of the uncinate process of the pancreas which is hypermetabolic. This could represent a metastatic focus or a second primary cancer. 3. Ill-defined asymmetric focus of mildly increased metabolic activity in the left iliac bone near the SI joint, maximum SUV 5.3, no CT correlate. Early osseous metastatic disease cannot be readily excluded, surveillance likely warranted. 4. Other imaging findings of potential clinical significance: Coronary, aortic arch, and branch vessel atherosclerotic vascular  disease. Left greater than right carotid atherosclerosis. Aortoiliac atherosclerotic vascular disease. Mild cardiomegaly. Bilateral calcified pleural plaques suggesting remote asbestos exposure. Sigmoid colon diverticulosis. Diffuse hepatic steatosis. Electronically Signed   By: Van Clines M.D.   On: 09/17/2016 12:58   Dg Chest Port 1 View  Result Date: 09/08/2016 CLINICAL DATA:  Followup bronchoscopy and biopsy EXAM: PORTABLE CHEST 1 VIEW COMPARISON:  09/01/2016 FINDINGS: Heart size is normal. Left middle mediastinal mass as seen previously. Some worsened atelectasis of the adjacent lung. Small region of atelectasis in the right lung. No pneumothorax or hemothorax. IMPRESSION: Newly seen atelectasis in the right mid lung and in the left upper lung adjacent to the middle mediastinal mass projecting towards the left. No pneumothorax or hemothorax. Electronically Signed   By: Nelson Chimes M.D.   On: 09/08/2016 10:08   Ct Angio Chest Aorta W/cm &/or Wo/cm  Result Date: 09/02/2016 CLINICAL DATA:  Abnormal chest x-ray. EXAM: CT ANGIOGRAPHY CHEST  WITH CONTRAST TECHNIQUE: Multidetector CT imaging of the chest was performed using the standard protocol during bolus administration of intravenous contrast. Multiplanar CT image reconstructions and MIPs were obtained to evaluate the vascular anatomy. CONTRAST:  80 mL of Isovue 370 intravenously. COMPARISON:  Radiographs of September 01, 2016. FINDINGS: Cardiovascular: Atherosclerosis of thoracic aorta is noted without aneurysm or dissection. Great vessels are widely patent without significant stenosis. Coronary artery calcifications are noted. Mediastinum/Nodes: 7.2 x 6.9 x 6.6 cm irregular mass is seen in the aortopulmonary window in the mediastinum and left hilar region concerning for malignancy. Lungs/Pleura: No pneumothorax or pleural effusion is noted. Right lung is clear. Left upper lobe atelectasis is noted most likely related to previously described  mediastinal mass. Calcified pleural plaques are noted along diaphragmatic pleural bilaterally. Upper Abdomen: No acute abnormality. Musculoskeletal: No chest wall abnormality. No acute or significant osseous findings. Review of the MIP images confirms the above findings. IMPRESSION: Atherosclerosis of thoracic aorta without aneurysm or dissection. Coronary artery calcifications are noted suggesting coronary artery disease. Bilateral calcified pleural plaques are noted suggesting asbestos exposure. 7.2 x 6.9 x 6.6 cm irregular mass is noted in the aortic pulmonary window of the mediastinum and left hilar region concerning for malignancy. Bronchoscopy is recommended for further evaluation. These results will be called to the ordering clinician or representative by the Radiologist Assistant, and communication documented in the PACS or zVision Dashboard. Electronically Signed   By: Marijo Conception, M.D.   On: 09/02/2016 15:55      IMPRESSION: 66 year-old gentleman with probable limited versus early extensive stage stage small cell lung cancer. PET scan shows uptake in the left iliac bone area without any CT correlate. There is also question of spread to the pancreatic region.  I explained to the patient that likely he has extensive stage disease which is incurable and also treatment will be of palliative nature. The lesions in the pancreas as well as the bone are suspicious but not completely confirmed at this point. I will give the patient the benefit of the doubt with treating him with aggressive approach similar to limited stage disease with chemotherapy as well as concurrent radiation. I also discussed with him the goals of care and offered him the palliative care option The patient is interested in proceeding with aggressive full dose radiation treatment directed at the chest. These plans however could change if his MRI the brain shows metastatic disease.  Today, I talked to the patient and family about  the findings and work-up thus far.  We discussed the natural history of small cell lung cancer and general treatment, highlighting the role of radiotherapy in the management.  We discussed the available radiation techniques, and focused on the details of logistics and delivery.  We reviewed the anticipated acute and late sequelae associated with radiation in this setting. The patient was encouraged to ask questions that I answered to the best of my ability. The patient would like to proceed with radiation and has been scheduled for CT simulation next Monday, 09/22/2016.  PLAN: He will return for CT simulation next Monday, 09/22/2016 and will begin treatment soon after.   He will be scheduled for MR Brain by Dr. Julien Nordmann for further work-up.    I spent 60 minutes minutes face to face with the patient and more than 50% of that time was spent in counseling and/or coordination of care.   -----------------------------------  Blair Promise, PhD, MD  This document serves as a record of  services personally performed by Gery Pray, MD. It was created on his behalf by Arlyce Harman, a trained medical scribe. The creation of this record is based on the scribe's personal observations and the provider's statements to them. This document has been checked and approved by the attending provider.

## 2016-09-18 NOTE — Progress Notes (Signed)
[place] [phone]   [phone] Multidisciplinary thoracic oncology clinic  CONSULT NOTE  REFERRING PHYSICIAN: Dr. Kara Mead  REASON FOR CONSULTATION:  66 years old white male recently diagnosed with lung cancer.  HPI Jesse Avila is a 66 y.o. male with past medical history significant for hypertension, dyslipidemia, peripheral vascular disease, carotid artery stenosis, stroke, diabetes mellitus, degenerative disc disease as well as long history of heavy smoking but quit 9 years ago. The patient mentioned that 3 weeks ago he started having chest congestion as well as hemoptysis for 2 days before he seeks any medical attention. He was seen by his primary care physician Dr. [name] and had chest x-ray on 09/01/2016 and it showed abnormal appearance of the aortic arch that is new since the prior examination. There was a concern about aneurysm or possibly dissection. This was followed by CT angiogram of the chest on 09/02/2016 and it showed 7.2 x 6.9 x 6.6 cm irregular mass in the aortic pulmonary window of the mediastinum and left hilar lesion concerning for malignancy. The patient was referred to Dr. Elsworth Soho. On 09/04/2016 he underwent a video bronchoscopy under the care of Dr. Elsworth Soho and it showed a partially obstructing mass found in the middle portion of the left upper lobe. The mass was bloody. Endobronchial biopsies were performed from the left upper lobe mass. 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 The patient had a PET scan performed yesterday and it showed the large left suprahilar mass invading the mediastinum had maximum SUV of 15.2 compatible with the histologic diagnosis of a small cell carcinoma. No hypermetabolic adenopathy in the chest. There was an approximately 1.9 cm mass of the uncinate process of the pancreas which was hypermetabolic. And this could represent a metastatic focus or second primary cancer.  There was also ill-defined symmetric focus of mildly increased metabolic activity in the left iliac bone near the SR joint with maximum SUV of 5.3 but no CT correlate. Early osseous metastatic disease cannot be excluded. Dr. Elsworth Soho kindly referred the patient to the multidisciplinary thoracic oncology clinic today for evaluation and recommendation regarding treatment of his condition. When seen today the patient is very anxious. He also has some mild nausea as well as cough with occasional hemoptysis. He denied having any chest pain or shortness of breath. He has hoarseness of his voice. He lost several pounds in the last few weeks. He denied having any nausea, vomiting, diarrhea or constipation. He has no headache or visual changes. Family history significant for mother with heart disease, sister had lung cancer. The patient is married and has 2 children, one deceased. He was accompanied today by his wife Dondra Spry, his daughter Larene Beach and son-in-law York Cerise. The patient is currently retired and used to work in Information systems manager. He has a history of smoking 2 pack per day for around 40 years and quit 9 years ago. He drinks 2-3 beers every day. No history of drug abuse.  HPI  Past Medical History:  Diagnosis Date  . Carotid artery occlusion   . DJD (degenerative joint disease) of knee   . Essential hypertension, benign    takes Benicar daily  . History of colon polyps    benign  . History of gout   . Hyperlipidemia    takes Fenofibrate and Crestor daily  . Insomnia    takes Ambien nightly as needed  . Joint pain   . Nocturia   .  Peripheral vascular disease ([place])   . Primary localized osteoarthritis of left knee 12/26/2014  . Stroke [facility]) 2015?   takes Plavix daily as well as Pletal,not on plavix at present 05/15/15  . 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 ABDOMINAL AORTAGRAM N/A 03/29/2012   Procedure: ABDOMINAL Maxcine Ham;  Surgeon: Angelia Mould, MD;  Location: [facility] CATH LAB;  Service: Cardiovascular;  Laterality: N/A;  . COLONOSCOPY    . ENDARTERECTOMY Right 05/09/2014   Procedure: RIGHT CAROTID ENDARTERECTOMY WITH PATCH ANGIOPLASTY;  Surgeon: Conrad Glenburn, MD;  Location: [place];  Service: Vascular;  Laterality: Right;  . ENDARTERECTOMY FEMORAL Right 05/17/2015   Procedure: ENDARTERECTOMY FEMORAL;  Surgeon: Angelia Mould, MD;  Location: [place];  Service: Vascular;  Laterality: Right;  . ENDOBRONCHIAL ULTRASOUND Bilateral 09/08/2016   Procedure: ENDOBRONCHIAL ULTRASOUND;  Surgeon: Rigoberto Noel, MD;  Location: [facility] ENDOSCOPY;  Service: Cardiopulmonary;  Laterality: Bilateral;  . FEMORAL-POPLITEAL BYPASS GRAFT Right 05/17/2015   Procedure: BYPASS GRAFT FEMORAL-BELOW KNEE POPLITEAL ARTERY, using gortex propaten graft 6 mm x 80 cm;  Surgeon: Angelia Mould, MD;  Location: [place];  Service: Vascular;  Laterality: Right;  . KNEE ARTHROSCOPY Right 11/2009  . LOWER EXTREMITY ANGIOGRAM Bilateral 03/23/2015   Procedure: Lower Extremity Angiogram;  Surgeon: Angelia Mould, MD;  Location: [place] CV LAB;  Service: Cardiovascular;  Laterality: Bilateral;  . MENISCUS REPAIR  11/2009  . PARTIAL KNEE ARTHROPLASTY Left 12/26/2014   Procedure: UNICOMPARTMENTAL KNEE;  Surgeon: Marchia Bond, MD;  Location: [place];  Service: Orthopedics;  Laterality: Left;  . PATCH ANGIOPLASTY Right 05/17/2015   Procedure: VEIN PATCH ANGIOPLASTY ILEOFEMORAL ARTERY;  Surgeon: Angelia Mould, MD;  Location: [place];  Service: Vascular;  Laterality: Right;  . PERCUTANEOUS STENT INTERVENTION N/A 05/10/2012   Procedure: PERCUTANEOUS STENT INTERVENTION;  Surgeon: Angelia Mould, MD;  Location: [facility] CATH LAB;  Service: Cardiovascular;  Laterality: N/A;  . PERIPHERAL VASCULAR CATHETERIZATION N/A 03/23/2015    Procedure: Abdominal Aortogram;  Surgeon: Angelia Mould, MD;  Location: [place] CV LAB;  Service: Cardiovascular;  Laterality: N/A;  . STENTS     PLACED IN ??BOTH LEGS   2013?    Family History  Problem Relation Age of Onset  . Diabetes Mother   . Hypertension Mother   . Heart disease Mother     Coronary Artery Bypass Graft  . Hyperlipidemia Mother   . Heart attack Mother   . Hypertension Father   . Hyperlipidemia Sister   . Stroke Sister     Social History Social History  Substance Use Topics  . Smoking status: Former Smoker    Packs/day: 0.00    Years: 0.00  . Smokeless tobacco: Never Used     Comment: quit smoking 4 yrs ago  . Alcohol use 0.0 oz/week     Comment: beer- occasionally    Allergies  Allergen Reactions  . Cialis [Tadalafil] Other (See Comments)    Headache    Current Outpatient Prescriptions  Medication Sig Dispense Refill  . aspirin EC 81 MG tablet Take 81 mg by mouth daily.    . Blood Glucose Monitoring Suppl (ACCU-CHEK NANO SMARTVIEW) w/Device KIT by Does not apply route.    . cilostazol (PLETAL) 100 MG tablet Take 100 mg by mouth 2 (two) times daily.     Marland Kitchen  fenofibrate 160 MG tablet Take 160 mg by mouth daily.    Marland Kitchen glimepiride (AMARYL) 4 MG tablet Take 4 mg by mouth daily with breakfast.    . metFORMIN (GLUCOPHAGE) 500 MG tablet Take 1 tablet (500 mg total) by mouth 3 (three) times daily.    . metoprolol succinate (TOPROL-XL) 50 MG 24 hr tablet Take 50 mg by mouth daily. Take with or immediately following a meal.    . pioglitazone (ACTOS) 15 MG tablet Take 15 mg by mouth daily.    . rosuvastatin (CRESTOR) 40 MG tablet Take 20 mg by mouth daily. For cholesterol    . zolpidem (AMBIEN) 10 MG tablet Take 10 mg by mouth at bedtime as needed.      No current facility-administered medications for this visit.     Review of Systems  Constitutional: positive for anorexia, fatigue and weight loss Eyes: negative Ears, nose, mouth, throat, and  face: negative Respiratory: positive for cough, dyspnea on exertion and hemoptysis Cardiovascular: negative Gastrointestinal: negative Genitourinary:negative Integument/breast: negative Hematologic/lymphatic: negative Musculoskeletal:negative Neurological: negative Behavioral/Psych: negative Endocrine: negative Allergic/Immunologic: negative  Physical Exam  ESP:QZRAQ, healthy, no distress, well nourished, well developed and anxious SKIN: skin color, texture, turgor are normal, no rashes or significant lesions HEAD: Normocephalic, No masses, lesions, tenderness or abnormalities EYES: normal, PERRLA, Conjunctiva are pink and non-injected EARS: External ears normal, Canals clear OROPHARYNX:no exudate, no erythema and lips, buccal mucosa, and tongue normal  NECK: supple, no adenopathy, no JVD LYMPH:  no palpable lymphadenopathy, no hepatosplenomegaly LUNGS: decreased breath sounds, expiratory wheezes bilaterally HEART: regular rate & rhythm, no murmurs and no gallops ABDOMEN:abdomen soft, non-tender, normal bowel sounds and no masses or organomegaly BACK: Back symmetric, no curvature., No CVA tenderness EXTREMITIES:no joint deformities, effusion, or inflammation, no edema, no skin discoloration  NEURO: alert & oriented x 3 with fluent speech, no focal motor/sensory deficits  PERFORMANCE STATUS: ECOG 1  LABORATORY DATA: Lab Results  Component Value Date   WBC 5.9 09/18/2016   HGB 12.2 (L) 09/18/2016   HCT 35.3 (L) 09/18/2016   MCV 91.7 09/18/2016   PLT 200 09/18/2016      Chemistry      Component Value Date/Time   NA 137 05/19/2015 0349   K 4.2 05/19/2015 0349   CL 103 05/19/2015 0349   CO2 29 05/19/2015 0349   BUN 15 05/19/2015 0349   CREATININE 1.19 05/19/2015 0349      Component Value Date/Time   CALCIUM 9.0 05/19/2015 0349   ALKPHOS 32 (L) 05/19/2015 0349   AST 14 (L) 05/19/2015 0349   ALT 19 05/19/2015 0349   BILITOT 0.5 05/19/2015 0349       RADIOGRAPHIC  STUDIES: Dg Chest 2 View  Result Date: 09/01/2016 CLINICAL DATA:  Coughing up blood, chest congestion and tightness for 2 weeks. EXAM: CHEST  2 VIEW COMPARISON:  PA and lateral chest 05/08/2014. FINDINGS: The patient's aortic arch has a markedly abnormal configuration which is new since the prior examination and worrisome for aneurysm and possibly dissection. There is aortic atherosclerosis. Lungs are clear. Calcified pleural plaque right hemidiaphragm noted and unchanged. No pneumothorax. No acute bony abnormality. IMPRESSION: Abnormal appearance of the aortic arch is new since the prior examination worrisome for aneurysm and possibly dissection. Recommend CT angiogram of the aorta for further evaluation. These results will be called to the ordering clinician or representative by the Radiologist Assistant, and communication documented in the PACS or zVision Dashboard. Electronically Signed   By: Inge Rise  M.D.   On: 09/01/2016 14:50   Nm Pet Image Initial (pi) Skull Base To Thigh  Result Date: 09/17/2016 CLINICAL DATA:  Initial treatment strategy for left upper lobe lung mass. EXAM: NUCLEAR MEDICINE PET SKULL BASE TO THIGH TECHNIQUE: 13.2 mCi F-18 FDG was injected intravenously. Full-ring PET imaging was performed from the skull base to thigh after the radiotracer. CT data was obtained and used for attenuation correction and anatomic localization. FASTING BLOOD GLUCOSE:  Value: 145 mg/dl COMPARISON:  Multiple exams, including 09/02/2016 FINDINGS: NECK No hypermetabolic lymph nodes in the neck. Carotid atherosclerosis, left greater than right. CHEST Left suprahilar mass invading the mediastinum, AP window, and encasing the left pulmonary artery, left upper lobe bronchus, and wraparound parts of the left lower lobe bronchus and left mainstem bronchus as shown previously, maximum SUV 15.2. Anterior lobulation could represent a separate lymph node or simply extension of the mass in the middle mediastinum.  Postobstructive atelectasis and pneumonitis in the left upper lobe. No separate metastatic focus separated from the main mass is identified in the chest. Coronary, aortic arch, and branch vessel atherosclerotic vascular disease. Mild cardiomegaly. Bilateral calcified pleural plaques. ABDOMEN/PELVIS Abnormal 1.9 cm focus of hypermetabolic activity in the uncinate process of the pancreas, maximum SUV 9.6, favoring malignancy. Scattered physiologic activity in the bowel, more notably in the colon. Circumferential activity at the anorectal junction is likely physiologic. Aortoiliac atherosclerotic vascular disease. Sigmoid colon diverticulosis diffuse hepatic steatosis. Diffuse hepatic steatosis. SKELETON In the left iliac bone there is an ill-defined focus of metabolic activity with maximum SUV 5.3 without CT correlate. Contralateral activity in this vicinity in the normal iliac bone maximum SUV 2.5. Several other speckled areas of faint metabolic activity in the lumbar spine are likely incidental. IMPRESSION: 1. The large left suprahilar mass invading the mediastinum has a maximum SUV of 15.2, appearance compatible with histologic diagnosis of small cell carcinoma. No hypermetabolic adenopathy in the chest. 2. There is an approximately 1.9 cm mass of the uncinate process of the pancreas which is hypermetabolic. This could represent a metastatic focus or a second primary cancer. 3. Ill-defined asymmetric focus of mildly increased metabolic activity in the left iliac bone near the SI joint, maximum SUV 5.3, no CT correlate. Early osseous metastatic disease cannot be readily excluded, surveillance likely warranted. 4. Other imaging findings of potential clinical significance: Coronary, aortic arch, and branch vessel atherosclerotic vascular disease. Left greater than right carotid atherosclerosis. Aortoiliac atherosclerotic vascular disease. Mild cardiomegaly. Bilateral calcified pleural plaques suggesting remote  asbestos exposure. Sigmoid colon diverticulosis. Diffuse hepatic steatosis. Electronically Signed   By: Van Clines M.D.   On: 09/17/2016 12:58   Dg Chest Port 1 View  Result Date: 09/08/2016 CLINICAL DATA:  Followup bronchoscopy and biopsy EXAM: PORTABLE CHEST 1 VIEW COMPARISON:  09/01/2016 FINDINGS: Heart size is normal. Left middle mediastinal mass as seen previously. Some worsened atelectasis of the adjacent lung. Small region of atelectasis in the right lung. No pneumothorax or hemothorax. IMPRESSION: Newly seen atelectasis in the right mid lung and in the left upper lung adjacent to the middle mediastinal mass projecting towards the left. No pneumothorax or hemothorax. Electronically Signed   By: Nelson Chimes M.D.   On: 09/08/2016 10:08   Ct Angio Chest Aorta W/cm &/or Wo/cm  Result Date: 09/02/2016 CLINICAL DATA:  Abnormal chest x-ray. EXAM: CT ANGIOGRAPHY CHEST WITH CONTRAST TECHNIQUE: Multidetector CT imaging of the chest was performed using the standard protocol during bolus administration of intravenous contrast. Multiplanar CT image  reconstructions and MIPs were obtained to evaluate the vascular anatomy. CONTRAST:  80 mL of Isovue 370 intravenously. COMPARISON:  Radiographs of September 01, 2016. FINDINGS: Cardiovascular: Atherosclerosis of thoracic aorta is noted without aneurysm or dissection. Great vessels are widely patent without significant stenosis. Coronary artery calcifications are noted. Mediastinum/Nodes: 7.2 x 6.9 x 6.6 cm irregular mass is seen in the aortopulmonary window in the mediastinum and left hilar region concerning for malignancy. Lungs/Pleura: No pneumothorax or pleural effusion is noted. Right lung is clear. Left upper lobe atelectasis is noted most likely related to previously described mediastinal mass. Calcified pleural plaques are noted along diaphragmatic pleural bilaterally. Upper Abdomen: No acute abnormality. Musculoskeletal: No chest wall abnormality. No acute  or significant osseous findings. Review of the MIP images confirms the above findings. IMPRESSION: Atherosclerosis of thoracic aorta without aneurysm or dissection. Coronary artery calcifications are noted suggesting coronary artery disease. Bilateral calcified pleural plaques are noted suggesting asbestos exposure. 7.2 x 6.9 x 6.6 cm irregular mass is noted in the aortic pulmonary window of the mediastinum and left hilar region concerning for malignancy. Bronchoscopy is recommended for further evaluation. These results will be called to the ordering clinician or representative by the Radiologist Assistant, and communication documented in the PACS or zVision Dashboard. Electronically Signed   By: Marijo Conception, M.D.   On: 09/02/2016 15:55    ASSESSMENT: This is a very pleasant 66 years old white male with highly suspicious extensive stage (T4, N2, M1b) small cell lung cancer presented with large left upper lobe lung mass with mediastinal invasion as well as questionable pancreatic lesion and suspicious left iliac bone metastasis diagnosed in February 2018.  PLAN: I had a lengthy discussion with the patient and his family today about his current disease is stage, prognosis and treatment options.. I recommended for the patient to complete the staging workup by ordering a MRI of the brain to rule out brain metastasis. I explained to the patient that likely he has extensive stage disease which is incurable and also treatment will be of palliative nature. The lesions in the pancreas as well as the bone are suspicious but not completely confirmed at this point. I will give the patient the benefit of the doubt with treating him with aggressive approach similar to limited stage disease with chemotherapy as well as concurrent radiation. I also discussed with him the goals of care and offered him the palliative care option The patient is interested in proceeding with the treatment. I recommended for him a course  of chemotherapy with carboplatin for AUC of 5 on day 1 and etoposide 100 MG/M2 on days 1, 2 and 3 with Neulasta support. I discussed with the patient adverse effect of this treatment including but not limited to alopecia, myelosuppression, nausea and vomiting, peripheral neuropathy, liver or renal dysfunction. He is expected to start the first cycle of this treatment on 09/24/2016. I will arrange for the patient to have a Port-A-Cath placed before his treatment. I will also arrange for the patient to have a chemotherapy vacation class before his treatment. He would come back for follow-up visit in 2 weeks for evaluation and management of any adverse effect of his treatment. I will call his pharmacy with prescription for Compazine 10 mg by mouth every 6 hours as needed for nausea in addition to Emla cream to be applied to the Port-A-Cath site before treatment. The patient voices understanding of current disease status and treatment options and is in agreement with the current  care plan. The patient was seen during the multidisciplinary thoracic oncology clinic today by medical oncology, radiation oncology, thoracic navigator, social worker and physical therapist. He was advised to call immediately if he has any concerning symptoms in the interval. All questions were answered. The patient knows to call the clinic with any problems, questions or concerns. We can certainly see the patient much sooner if necessary.  Thank you so much for allowing me to participate in the care of Jesse Avila. I will continue to follow up the patient with you and assist in his care.  I spent 55 minutes counseling the patient face to face. The total time spent in the appointment was 80 minutes.  Disclaimer: This note was dictated with voice recognition software. Similar sounding words can inadvertently be transcribed and may not be corrected upon review.   Shareef Eddinger K. September 18, 2016, 2:05 PM

## 2016-09-19 ENCOUNTER — Other Ambulatory Visit (HOSPITAL_COMMUNITY): Payer: BLUE CROSS/BLUE SHIELD

## 2016-09-19 ENCOUNTER — Encounter: Payer: Self-pay | Admitting: *Deleted

## 2016-09-19 NOTE — Progress Notes (Signed)
Oncology Nurse Navigator Documentation  Oncology Nurse Navigator Flowsheets 09/19/2016  Navigator Location [facility]  Navigator Encounter Type Clinic/MDC/I spoke with Jesse Avila and his family yesterday at thoracic clinic.  Gave and explained information on lung cancer, support and resources at [facility], and next steps.  Today, I contacted authorization coordinator to get mri brain scheduled.   Abnormal Finding Date 09/01/2016  Confirmed Diagnosis Date 09/08/2016  Multidisiplinary Clinic Date 09/18/2016  Treatment Initiated Date 09/22/2016  Patient Visit Type MedOnc  Treatment Phase Pre-Tx/Tx Discussion  Barriers/Navigation Needs Education;Coordination of Care  Education Newly Diagnosed Cancer Education  Interventions Education;Coordination of Care  Coordination of Care Appts;Other  Education Method Verbal;Written  Acuity Level 2  Acuity Level 2 Educational needs;Other  Time Spent with Patient 30

## 2016-09-21 ENCOUNTER — Emergency Department (HOSPITAL_COMMUNITY)
Admission: EM | Admit: 2016-09-21 | Discharge: 2016-09-21 | Disposition: A | Discharge status: Home / Self Care | Payer: BLUE CROSS/BLUE SHIELD | Attending: Emergency Medicine | Admitting: Emergency Medicine

## 2016-09-21 ENCOUNTER — Emergency Department (HOSPITAL_COMMUNITY): Payer: BLUE CROSS/BLUE SHIELD

## 2016-09-21 ENCOUNTER — Encounter (HOSPITAL_COMMUNITY): Payer: Self-pay | Admitting: Emergency Medicine

## 2016-09-21 DIAGNOSIS — R091 Pleurisy: Secondary | ICD-10-CM | POA: Diagnosis not present

## 2016-09-21 DIAGNOSIS — Z79899 Other long term (current) drug therapy: Secondary | ICD-10-CM | POA: Diagnosis not present

## 2016-09-21 DIAGNOSIS — Z8673 Personal history of transient ischemic attack (TIA), and cerebral infarction without residual deficits: Secondary | ICD-10-CM | POA: Diagnosis not present

## 2016-09-21 DIAGNOSIS — Z85118 Personal history of other malignant neoplasm of bronchus and lung: Secondary | ICD-10-CM | POA: Diagnosis not present

## 2016-09-21 DIAGNOSIS — Z7982 Long term (current) use of aspirin: Secondary | ICD-10-CM | POA: Insufficient documentation

## 2016-09-21 DIAGNOSIS — Z96652 Presence of left artificial knee joint: Secondary | ICD-10-CM | POA: Diagnosis not present

## 2016-09-21 DIAGNOSIS — Z87891 Personal history of nicotine dependence: Secondary | ICD-10-CM | POA: Diagnosis not present

## 2016-09-21 DIAGNOSIS — R0789 Other chest pain: Secondary | ICD-10-CM | POA: Diagnosis present

## 2016-09-21 DIAGNOSIS — E119 Type 2 diabetes mellitus without complications: Secondary | ICD-10-CM | POA: Diagnosis not present

## 2016-09-21 DIAGNOSIS — M546 Pain in thoracic spine: Secondary | ICD-10-CM | POA: Insufficient documentation

## 2016-09-21 DIAGNOSIS — J449 Chronic obstructive pulmonary disease, unspecified: Secondary | ICD-10-CM | POA: Insufficient documentation

## 2016-09-21 DIAGNOSIS — M549 Dorsalgia, unspecified: Secondary | ICD-10-CM

## 2016-09-21 DIAGNOSIS — I10 Essential (primary) hypertension: Secondary | ICD-10-CM | POA: Diagnosis not present

## 2016-09-21 DIAGNOSIS — Z7984 Long term (current) use of oral hypoglycemic drugs: Secondary | ICD-10-CM | POA: Insufficient documentation

## 2016-09-21 LAB — BASIC METABOLIC PANEL
ANION GAP: 10 (ref 5–15)
BUN: 13 mg/dL (ref 6–20)
CALCIUM: 9.3 mg/dL (ref 8.9–10.3)
CO2: 21 mmol/L — ABNORMAL LOW (ref 22–32)
CREATININE: 1.01 mg/dL (ref 0.61–1.24)
Chloride: 102 mmol/L (ref 101–111)
GFR calc Af Amer: >60 mL/min (ref >60)
GLUCOSE: 181 mg/dL — AB (ref 65–99)
Potassium: 3.6 mmol/L (ref 3.5–5.1)
Sodium: 133 mmol/L — ABNORMAL LOW (ref 135–145)

## 2016-09-21 LAB — TROPONIN I: Troponin I: <0.03 ng/mL (ref <0.03)

## 2016-09-21 LAB — CBC
HCT: 34.3 % — ABNORMAL LOW (ref 39.0–52.0)
Hemoglobin: 11.9 g/dL — ABNORMAL LOW (ref 13.0–17.0)
MCH: 32 pg (ref 26.0–34.0)
MCHC: 34.7 g/dL (ref 30.0–36.0)
MCV: 92.2 fL (ref 78.0–100.0)
PLATELETS: 200 10*3/uL (ref 150–400)
RBC: 3.72 MIL/uL — ABNORMAL LOW (ref 4.22–5.81)
RDW: 13.1 % (ref 11.5–15.5)
WBC: 7.1 10*3/uL (ref 4.0–10.5)

## 2016-09-21 MED ORDER — HYDROMORPHONE HCL 1 MG/ML IJ SOLN
1.0000 mg | Freq: Once | INTRAMUSCULAR | Status: AC
  Administered 2016-09-21: 1 mg via INTRAVENOUS
  Filled 2016-09-21: qty 1

## 2016-09-21 MED ORDER — IOPAMIDOL (ISOVUE-370) INJECTION 76%
100.0000 mL | Freq: Once | INTRAVENOUS | Status: AC | PRN
  Administered 2016-09-21: 100 mL via INTRAVENOUS

## 2016-09-21 MED ORDER — HYDROCODONE-ACETAMINOPHEN 5-325 MG PO TABS: 1.0000 | ORAL_TABLET | ORAL | 0 refills | Status: DC | PRN

## 2016-09-21 NOTE — ED Provider Notes (Signed)
[facility] DEPT Provider Note   CSN: [id number] Arrival date & time: 09/21/16  1427     History   Chief Complaint Chief Complaint  Patient presents with  . Shoulder Pain    HPI Jesse Avila is a 66 y.o. male.  HPI Patient was recently diagnosed with lung cancer and is scheduled to began cancer treatment this week.  His cancers on the left side of his lungs.  He presents the emergency department for new left scapular pain which is worse with deep breathing over the past 24 hours.  No significant shortness of breath.  Denies unilateral leg swelling.  No history DVT or pulmonary embolism.  No recent change in his cough.  No fevers or chills.  No anterior chest pain.  No jaw pain, neck pain, shoulder pain.  Pain is moderate in severity with deep breathing   Past Medical History:  Diagnosis Date  . Carotid artery occlusion   . DJD (degenerative joint disease) of knee   . Essential hypertension, benign    takes Benicar daily  . Goals of care, counseling/discussion 09/18/2016  . History of colon polyps    benign  . History of gout   . Hyperlipidemia    takes Fenofibrate and Crestor daily  . Insomnia    takes Ambien nightly as needed  . Joint pain   . Nocturia   . Peripheral vascular disease ([place])   . Primary localized osteoarthritis of left knee 12/26/2014  . Stroke [facility]) 2015?   takes Plavix daily as well as Pletal,not on plavix at present 05/15/15  . 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 Small cell lung carcinoma, left ([place]) 09/18/2016  . Goals of care, counseling/discussion 09/18/2016  . Encounter for antineoplastic chemotherapy 09/18/2016  . Hilar lymphadenopathy   . Mass of upper lobe of left lung 09/03/2016  . COPD (chronic obstructive pulmonary disease) ([place]) 09/03/2016  . PAD (peripheral  artery disease) ([place]) 05/17/2015  . Primary localized osteoarthritis of left knee 12/26/2014  . Knee osteoarthritis 12/26/2014  . Occlusion and stenosis of carotid artery without mention of cerebral infarction 04/21/2014  . History of stroke 04/21/2014  . Carotid stenosis with cerebral infarction less than 8 weeks ago 04/16/2014  . Diabetes mellitus, type 2 ([place]) 04/14/2014  . Obesity 04/14/2014  . Insomnia 10/18/2013  . Preop cardiovascular exam 12/12/2011  . DJD (degenerative joint disease) of knee   . ED (erectile dysfunction)   . GERD (gastroesophageal reflux disease)   . Essential hypertension, benign   . Peripheral vascular disease ([place])   . Hyperlipidemia   . Critical lower limb ischemia 11/19/2011    Past Surgical History:  Procedure Laterality Date  . ABDOMINAL AORTAGRAM N/A 03/29/2012   Procedure: ABDOMINAL Maxcine Ham;  Surgeon: Angelia Mould, MD;  Location[facility] CATH LAB;  Service: Cardiovascular;  Laterality: N/A;  . COLONOSCOPY    . ENDARTERECTOMY Right 05/09/2014   Procedure: RIGHT CAROTID ENDARTERECTOMY WITH PATCH ANGIOPLASTY;  Surgeon: Conrad Wildwood, MD;  Location: [place];  Service: Vascular;  Laterality: Right;  . ENDARTERECTOMY FEMORAL Right 05/17/2015   Procedure: ENDARTERECTOMY FEMORAL;  Surgeon: Angelia Mould, MD;  Location: [place];  Service: Vascular;  Laterality: Right;  . ENDOBRONCHIAL ULTRASOUND Bilateral 09/08/2016   Procedure: ENDOBRONCHIAL ULTRASOUND;  Surgeon: Rigoberto Noel, MD;  Location: [facility] ENDOSCOPY;  Service: Cardiopulmonary;  Laterality: Bilateral;  . FEMORAL-POPLITEAL BYPASS GRAFT Right 05/17/2015   Procedure: BYPASS GRAFT FEMORAL-BELOW KNEE POPLITEAL ARTERY, using gortex propaten graft 6 mm x 80 cm;  Surgeon: Angelia Mould, MD;  Location: [place];  Service: Vascular;  Laterality: Right;  . KNEE ARTHROSCOPY Right 11/2009  . LOWER EXTREMITY ANGIOGRAM Bilateral 03/23/2015   Procedure: Lower Extremity Angiogram;  Surgeon: Angelia Mould, MD;  Location: [place] CV LAB;  Service: Cardiovascular;  Laterality: Bilateral;  . MENISCUS REPAIR  11/2009  . PARTIAL KNEE ARTHROPLASTY Left 12/26/2014   Procedure: UNICOMPARTMENTAL KNEE;  Surgeon: Marchia Bond, MD;  Location: [place];  Service: Orthopedics;  Laterality: Left;  . PATCH ANGIOPLASTY Right 05/17/2015   Procedure: VEIN PATCH ANGIOPLASTY ILEOFEMORAL ARTERY;  Surgeon: Angelia Mould, MD;  Location: [place];  Service: Vascular;  Laterality: Right;  . PERCUTANEOUS STENT INTERVENTION N/A 05/10/2012   Procedure: PERCUTANEOUS STENT INTERVENTION;  Surgeon: Angelia Mould, MD;  Location: [facility] CATH LAB;  Service: Cardiovascular;  Laterality: N/A;  . PERIPHERAL VASCULAR CATHETERIZATION N/A 03/23/2015   Procedure: Abdominal Aortogram;  Surgeon: Angelia Mould, MD;  Location: [place] CV LAB;  Service: Cardiovascular;  Laterality: N/A;  . STENTS     PLACED IN ??BOTH LEGS   2013?       Home Medications    Prior to Admission medications   Medication Sig Start Date End Date Taking? Authorizing Provider  aspirin EC 81 MG tablet Take 81 mg by mouth daily.    Historical Provider, MD  Blood Glucose Monitoring Suppl (ACCU-CHEK NANO SMARTVIEW) w/Device KIT by Does not apply route.    Historical Provider, MD  cilostazol (PLETAL) 100 MG tablet Take 100 mg by mouth 2 (two) times daily.     Historical Provider, MD  fenofibrate 160 MG tablet Take 160 mg by mouth daily.    Historical Provider, MD  glimepiride (AMARYL) 4 MG tablet Take 4 mg by mouth daily with breakfast.    Historical Provider, MD  HYDROcodone-acetaminophen (NORCO/VICODIN) 5-325 MG tablet Take 1 tablet by mouth every 4 (four) hours as needed for moderate pain. 09/21/16   Jola Schmidt, MD  lidocaine-prilocaine (EMLA) cream Apply 1 application topically as needed. 09/18/16   Curt Bears, MD  metFORMIN (GLUCOPHAGE) 500 MG tablet Take 1 tablet (500 mg total) by mouth 3 (three) times daily. 04/18/14   Maryann  Mikhail, DO  metoprolol succinate (TOPROL-XL) 50 MG 24 hr tablet Take 50 mg by mouth daily. Take with or immediately following a meal.    Historical Provider, MD  pioglitazone (ACTOS) 15 MG tablet Take 15 mg by mouth daily.    Historical Provider, MD  prochlorperazine (COMPAZINE) 10 MG tablet Take 1 tablet (10 mg total) by mouth every 6 (six) hours as needed for nausea or vomiting. 09/18/16   Curt Bears, MD  rosuvastatin (CRESTOR) 40 MG tablet Take 20 mg by mouth daily. For cholesterol    Historical Provider, MD  zolpidem (AMBIEN) 10 MG tablet Take 10 mg by mouth at bedtime as needed.  01/26/15   Historical Provider, MD    Family History Family History  Problem Relation Age of Onset  . Diabetes Mother   . Hypertension Mother   . Heart disease Mother     Coronary Artery Bypass Graft  . Hyperlipidemia Mother   . Heart attack Mother   . Hypertension Father   . Hyperlipidemia Sister   . Stroke Sister     Social History Social History  Substance Use Topics  . Smoking status: Former Smoker    Packs/day: 0.00    Years: 0.00  . Smokeless tobacco: Never Used     Comment: quit smoking 4 yrs ago  . Alcohol use 0.0 oz/week     Comment: beer- occasionally     Allergies   Cialis [tadalafil]   Review of Systems Review of Systems  All other systems reviewed and are negative.    Physical Exam Updated Vital Signs BP 137/88 (BP Location: Right Arm)   Pulse 84   Temp 98.6 F (37 C) (Oral)   Ht 6' (1.829 m)   Wt 223 lb (101.2 kg)   SpO2 93%   BMI 30.24 kg/m   Physical Exam  Constitutional: He is oriented to person, place, and time. He appears well-developed and well-nourished.  HENT:  Head: Normocephalic and atraumatic.  Eyes: EOM are normal.  Neck: Normal range of motion.  Cardiovascular: Normal rate, regular rhythm and normal heart sounds.   Pulmonary/Chest: Effort normal and breath sounds normal. No respiratory distress.  Abdominal: Soft. He exhibits no distension.  There is no tenderness.  Musculoskeletal: Normal range of motion. He exhibits no edema or tenderness.  Neurological: He is alert and oriented to person, place, and time.  Skin: Skin is warm and dry.  Psychiatric: He has a normal mood and affect. Judgment normal.  Nursing note and vitals reviewed.    ED Treatments / Results  Labs (all labs ordered are listed, but only abnormal results are displayed) Labs Reviewed  CBC - Abnormal; Notable for the following:       Result Value   RBC 3.72 (*)    Hemoglobin 11.9 (*)    HCT 34.3 (*)    All other components within normal limits  BASIC METABOLIC PANEL  TROPONIN I    EKG  EKG Interpretation None       Radiology Dg Chest 2 View  Result Date: 09/21/2016 CLINICAL DATA:  Patient with history of lung cancer. Posterior chest and scapular pain. EXAM: CHEST  2 VIEW COMPARISON:  Chest radiograph 09/08/2016. FINDINGS: Stable cardiac and mediastinal contours. Persistent large left suprahilar mass. Unchanged bandlike consolidation within the left mid lung. Pleural thickening overlying the left lung apex. No pneumothorax. Thoracic spine degenerative changes. IMPRESSION: Probable small amount of pleural fluid left upper hemithorax. Re- demonstrated large left suprahilar mass. Electronically Signed   By: Lovey Newcomer M.D.   On: 09/21/2016 15:40    Procedures Procedures (including critical care time)  Medications Ordered in ED Medications  HYDROmorphone (DILAUDID) injection 1 mg (1 mg Intravenous Given 09/21/16 1550)     Initial Impression / Assessment and Plan / ED Course  I have reviewed the triage vital signs and the nursing notes.  Pertinent labs & imaging results that were available during my care of the patient were reviewed by me and considered in my medical decision making (see chart for details).     Given new posterior left pleuritic chest pain and new active cancer the patient will undergo CT imaging of his chest to evaluate for  PE.  Of his CT scan of his chest is negative for PE can be discharged home safely with follow-up with his oncology team.  Home with hydrocodone.  Care transferred to Dr. Zenia Resides to follow-up on CT imaging.  Patient and family updated.  Final Clinical Impressions(s) / ED Diagnoses   Final diagnoses:  Pleurisy  Upper back pain on left side    New Prescriptions New  Prescriptions   HYDROCODONE-ACETAMINOPHEN (NORCO/VICODIN) 5-325 MG TABLET    Take 1 tablet by mouth every 4 (four) hours as needed for moderate pain.     Jola Schmidt, MD 09/21/16 [phone]

## 2016-09-21 NOTE — ED Provider Notes (Signed)
Patient signed out to me by Dr. Venora Maples and CAT scan of the chest shows no evidence of PE. Patient to follow-up with her doctor.   Lacretia Leigh, MD 09/21/16 [phone]

## 2016-09-21 NOTE — ED Notes (Signed)
IV team at bedside 

## 2016-09-21 NOTE — ED Triage Notes (Signed)
Pt c/o sharp pain under left shoulder blade. Pt reports he was diagnosed with lung cancer last week and is supposed to begin treatment this week. Pt denies chest pain. Wife reports pt was diaphoretic last night due to pain.

## 2016-09-22 ENCOUNTER — Other Ambulatory Visit: Payer: BLUE CROSS/BLUE SHIELD

## 2016-09-22 ENCOUNTER — Ambulatory Visit
Admission: RE | Admit: 2016-09-22 | Discharge: 2016-09-22 | Disposition: A | Discharge status: Home / Self Care | Payer: BLUE CROSS/BLUE SHIELD | Source: Ambulatory Visit | Attending: Radiation Oncology | Admitting: Radiation Oncology

## 2016-09-22 DIAGNOSIS — C3492 Malignant neoplasm of unspecified part of left bronchus or lung: Secondary | ICD-10-CM | POA: Diagnosis present

## 2016-09-22 DIAGNOSIS — R5383 Other fatigue: Secondary | ICD-10-CM | POA: Diagnosis not present

## 2016-09-22 DIAGNOSIS — C3402 Malignant neoplasm of left main bronchus: Secondary | ICD-10-CM | POA: Insufficient documentation

## 2016-09-22 DIAGNOSIS — Z510 Encounter for antineoplastic radiation therapy: Secondary | ICD-10-CM | POA: Insufficient documentation

## 2016-09-22 NOTE — Progress Notes (Addendum)
  Radiation Oncology         [phone] ________________________________  Name: Jesse Avila MRN: [id number]  Date: 09/22/2016  DOB: Jul 24, 1951  SIMULATION AND TREATMENT PLANNING NOTE    ICD-9-CM ICD-10-CM   1. Small cell lung carcinoma, left (HCC) 162.9 C34.92     DIAGNOSIS:  Limited versus "early" extensive stage small cell lung cancer (T4, N2, M1b) of the left upper lung  NARRATIVE:  The patient was brought to the [facility].  Identity was confirmed.  All relevant records and images related to the planned course of therapy were reviewed.  The patient freely provided informed written consent to proceed with treatment after reviewing the details related to the planned course of therapy. The consent form was witnessed and verified by the simulation staff.  Then, the patient was set-up in a stable reproducible  supine position for radiation therapy.  CT images were obtained.  Surface markings were placed.  The CT images were loaded into the planning software.  Then the target and avoidance structures were contoured.  Treatment planning then occurred.  The radiation prescription was entered and confirmed.  Then, I designed and supervised the construction of a total of 6 medically necessary complex treatment devices.  I have requested : 3D Simulation  I have requested a DVH of the following structures: heart, lungs, PTV, GTV.  I have ordered:dose calc.  PLAN:  The patient will receive 60 Gy in 30 fractions.   Special Treatment Procedure Note: The patient will be receiving radiosensitizing chemotherapy. Given the potential of increased toxicities related to combined therapy and the necessity for close monitoring of the patient and blood work, this constitutes a special treatment procedure.  -----------------------------------  Blair Promise, PhD, MD  This document serves as a record of services personally performed by Gery Pray, MD. It was created on his behalf by Darcus Austin, a trained medical scribe. The creation of this record is based on the scribe's personal observations and the provider's statements to them. This document has been checked and approved by the attending provider.

## 2016-09-23 ENCOUNTER — Encounter: Payer: Self-pay | Admitting: *Deleted

## 2016-09-23 NOTE — Progress Notes (Signed)
Oncology Nurse Navigator Documentation  Oncology Nurse Navigator Flowsheets 09/23/2016  Navigator Location [facility]  Navigator Encounter Type Other/patient has not been set up for chemo as of today.  I completed LOS to scheduling to help expedite appts.  I clarified exactly when to schedule chemo, injection, and follow up with Dr. Julien Nordmann.   Treatment Phase Pre-Tx/Tx Discussion  Barriers/Navigation Needs Coordination of Care  Interventions Coordination of Care  Coordination of Care Appts  Acuity Level 2  Time Spent with Patient 30

## 2016-09-24 ENCOUNTER — Other Ambulatory Visit: Payer: Self-pay | Admitting: *Deleted

## 2016-09-24 ENCOUNTER — Other Ambulatory Visit (HOSPITAL_BASED_OUTPATIENT_CLINIC_OR_DEPARTMENT_OTHER): Payer: BLUE CROSS/BLUE SHIELD

## 2016-09-24 ENCOUNTER — Telehealth: Payer: Self-pay | Admitting: *Deleted

## 2016-09-24 ENCOUNTER — Other Ambulatory Visit: Payer: Self-pay | Admitting: Physician Assistant

## 2016-09-24 ENCOUNTER — Other Ambulatory Visit: Payer: Self-pay | Admitting: Radiology

## 2016-09-24 ENCOUNTER — Ambulatory Visit: Payer: BLUE CROSS/BLUE SHIELD | Admitting: Pulmonary Disease

## 2016-09-24 ENCOUNTER — Other Ambulatory Visit: Payer: Self-pay | Admitting: Internal Medicine

## 2016-09-24 ENCOUNTER — Encounter (HOSPITAL_COMMUNITY): Payer: BLUE CROSS/BLUE SHIELD

## 2016-09-24 ENCOUNTER — Ambulatory Visit (HOSPITAL_BASED_OUTPATIENT_CLINIC_OR_DEPARTMENT_OTHER): Payer: BLUE CROSS/BLUE SHIELD

## 2016-09-24 VITALS — BP 153/91 | HR 74 | Temp 98.2°F | Resp 18

## 2016-09-24 DIAGNOSIS — C3412 Malignant neoplasm of upper lobe, left bronchus or lung: Secondary | ICD-10-CM

## 2016-09-24 DIAGNOSIS — C3492 Malignant neoplasm of unspecified part of left bronchus or lung: Secondary | ICD-10-CM

## 2016-09-24 DIAGNOSIS — I739 Peripheral vascular disease, unspecified: Secondary | ICD-10-CM

## 2016-09-24 DIAGNOSIS — Z5111 Encounter for antineoplastic chemotherapy: Secondary | ICD-10-CM | POA: Diagnosis not present

## 2016-09-24 DIAGNOSIS — Z7189 Other specified counseling: Secondary | ICD-10-CM

## 2016-09-24 DIAGNOSIS — I10 Essential (primary) hypertension: Secondary | ICD-10-CM

## 2016-09-24 LAB — CBC WITH DIFFERENTIAL/PLATELET
BASO%: 0.2 % (ref 0.0–2.0)
BASOS ABS: 0 10*3/uL (ref 0.0–0.1)
EOS ABS: 0.2 10*3/uL (ref 0.0–0.5)
EOS%: 3.4 % (ref 0.0–7.0)
HCT: 35.6 % — ABNORMAL LOW (ref 38.4–49.9)
HEMOGLOBIN: 12.1 g/dL — AB (ref 13.0–17.1)
LYMPH#: 1.4 10*3/uL (ref 0.9–3.3)
LYMPH%: 23.8 % (ref 14.0–49.0)
MCH: 31.5 pg (ref 27.2–33.4)
MCHC: 34 g/dL (ref 32.0–36.0)
MCV: 92.7 fL (ref 79.3–98.0)
MONO#: 0.3 10*3/uL (ref 0.1–0.9)
MONO%: 5.9 % (ref 0.0–14.0)
NEUT#: 3.9 10*3/uL (ref 1.5–6.5)
NEUT%: 66.7 % (ref 39.0–75.0)
PLATELETS: 211 10*3/uL (ref 140–400)
RBC: 3.84 10*6/uL — ABNORMAL LOW (ref 4.20–5.82)
RDW: 12.9 % (ref 11.0–14.6)
WBC: 5.8 10*3/uL (ref 4.0–10.3)

## 2016-09-24 LAB — COMPREHENSIVE METABOLIC PANEL
ALBUMIN: 3.5 g/dL (ref 3.5–5.0)
ALK PHOS: 55 U/L (ref 40–150)
ALT: 18 U/L (ref 0–55)
ANION GAP: 12 meq/L — AB (ref 3–11)
AST: 15 U/L (ref 5–34)
BILIRUBIN TOTAL: 0.49 mg/dL (ref 0.20–1.20)
BUN: 12 mg/dL (ref 7.0–26.0)
CALCIUM: 10.1 mg/dL (ref 8.4–10.4)
CO2: 20 mEq/L — ABNORMAL LOW (ref 22–29)
Chloride: 104 mEq/L (ref 98–109)
Creatinine: 1.1 mg/dL (ref 0.7–1.3)
EGFR: 72 mL/min/{1.73_m2} — ABNORMAL LOW (ref >90)
Glucose: 208 mg/dl — ABNORMAL HIGH (ref 70–140)
POTASSIUM: 4.1 meq/L (ref 3.5–5.1)
Sodium: 137 mEq/L (ref 136–145)
Total Protein: 7.5 g/dL (ref 6.4–8.3)

## 2016-09-24 MED ORDER — DEXAMETHASONE SODIUM PHOSPHATE 10 MG/ML IJ SOLN
INTRAMUSCULAR | Status: AC
Start: 2016-09-24 — End: 2016-09-24
  Filled 2016-09-24: qty 1

## 2016-09-24 MED ORDER — DEXAMETHASONE SODIUM PHOSPHATE 10 MG/ML IJ SOLN
10.0000 mg | Freq: Once | INTRAMUSCULAR | Status: AC
  Administered 2016-09-24: 10 mg via INTRAVENOUS

## 2016-09-24 MED ORDER — SODIUM CHLORIDE 0.9 % IV SOLN
Freq: Once | INTRAVENOUS | Status: AC
  Administered 2016-09-24: 10:00:00 via INTRAVENOUS

## 2016-09-24 MED ORDER — SODIUM CHLORIDE 0.9 % IV SOLN: 10.0000 mg | Freq: Once | INTRAVENOUS | Status: DC

## 2016-09-24 MED ORDER — SODIUM CHLORIDE 0.9 % IV SOLN
577.0000 mg | Freq: Once | INTRAVENOUS | Status: AC
  Administered 2016-09-24: 580 mg via INTRAVENOUS
  Filled 2016-09-24: qty 58

## 2016-09-24 MED ORDER — SODIUM CHLORIDE 0.9 % IV SOLN
100.0000 mg/m2 | Freq: Once | INTRAVENOUS | Status: AC
  Administered 2016-09-24: 230 mg via INTRAVENOUS
  Filled 2016-09-24: qty 11.5

## 2016-09-24 MED ORDER — PALONOSETRON HCL INJECTION 0.25 MG/5ML
INTRAVENOUS | Status: AC
  Filled 2016-09-24: qty 5

## 2016-09-24 MED ORDER — PALONOSETRON HCL INJECTION 0.25 MG/5ML
0.2500 mg | Freq: Once | INTRAVENOUS | Status: AC
  Administered 2016-09-24: 0.25 mg via INTRAVENOUS

## 2016-09-24 NOTE — Patient Instructions (Signed)
[facility] Discharge Instructions for Patients Receiving Chemotherapy  Today you received the following chemotherapy agents: Carboplatin and Etoposide.  To help prevent nausea and vomiting after your treatment, we encourage you to take your nausea medication as prescribed.   If you develop nausea and vomiting that is not controlled by your nausea medication, call the clinic.   BELOW ARE SYMPTOMS THAT SHOULD BE REPORTED IMMEDIATELY:  *FEVER GREATER THAN 100.5 F  *CHILLS WITH OR WITHOUT FEVER  NAUSEA AND VOMITING THAT IS NOT CONTROLLED WITH YOUR NAUSEA MEDICATION  *UNUSUAL SHORTNESS OF BREATH  *UNUSUAL BRUISING OR BLEEDING  TENDERNESS IN MOUTH AND THROAT WITH OR WITHOUT PRESENCE OF ULCERS  *URINARY PROBLEMS  *BOWEL PROBLEMS  UNUSUAL RASH Items with * indicate a potential emergency and should be followed up as soon as possible.  Feel free to call the clinic you have any questions or concerns. The clinic phone number is [phone].  Please show the [facility] [place] at check-in to the Emergency Department and triage nurse.  Carboplatin injection What is this medicine? CARBOPLATIN (KAR boe pla tin) is a chemotherapy drug. It targets fast dividing cells, like cancer cells, and causes these cells to die. This medicine is used to treat ovarian cancer and many other cancers. This medicine may be used for other purposes; ask your health care provider or pharmacist if you have questions. COMMON BRAND NAME(S): Paraplatin What should I tell my health care provider before I take this medicine? They need to know if you have any of these conditions: -blood disorders -hearing problems -kidney disease -recent or ongoing radiation therapy -an unusual or allergic reaction to carboplatin, cisplatin, other chemotherapy, other medicines, foods, dyes, or preservatives -pregnant or trying to get pregnant -breast-feeding How should I use this medicine? This drug is usually  given as an infusion into a vein. It is administered in a hospital or clinic by a specially trained health care professional. Talk to your pediatrician regarding the use of this medicine in children. Special care may be needed. Overdosage: If you think you have taken too much of this medicine contact a poison control center or emergency room at once. NOTE: This medicine is only for you. Do not share this medicine with others. What if I miss a dose? It is important not to miss a dose. Call your doctor or health care professional if you are unable to keep an appointment. What may interact with this medicine? -medicines for seizures -medicines to increase blood counts like filgrastim, pegfilgrastim, sargramostim -some antibiotics like amikacin, gentamicin, neomycin, streptomycin, tobramycin -vaccines Talk to your doctor or health care professional before taking any of these medicines: -acetaminophen -aspirin -ibuprofen -ketoprofen -naproxen This list may not describe all possible interactions. Give your health care provider a list of all the medicines, herbs, non-prescription drugs, or dietary supplements you use. Also tell them if you smoke, drink alcohol, or use illegal drugs. Some items may interact with your medicine. What should I watch for while using this medicine? Your condition will be monitored carefully while you are receiving this medicine. You will need important blood work done while you are taking this medicine. This drug may make you feel generally unwell. This is not uncommon, as chemotherapy can affect healthy cells as well as cancer cells. Report any side effects. Continue your course of treatment even though you feel ill unless your doctor tells you to stop. In some cases, you may be given additional medicines to help with side effects. Follow  all directions for their use. Call your doctor or health care professional for advice if you get a fever, chills or sore throat, or  other symptoms of a cold or flu. Do not treat yourself. This drug decreases your body's ability to fight infections. Try to avoid being around people who are sick. This medicine may increase your risk to bruise or bleed. Call your doctor or health care professional if you notice any unusual bleeding. Be careful brushing and flossing your teeth or using a toothpick because you may get an infection or bleed more easily. If you have any dental work done, tell your dentist you are receiving this medicine. Avoid taking products that contain aspirin, acetaminophen, ibuprofen, naproxen, or ketoprofen unless instructed by your doctor. These medicines may hide a fever. Do not become pregnant while taking this medicine. Women should inform their doctor if they wish to become pregnant or think they might be pregnant. There is a potential for serious side effects to an unborn child. Talk to your health care professional or pharmacist for more information. Do not breast-feed an infant while taking this medicine. What side effects may I notice from receiving this medicine? Side effects that you should report to your doctor or health care professional as soon as possible: -allergic reactions like skin rash, itching or hives, swelling of the face, lips, or tongue -signs of infection - fever or chills, cough, sore throat, pain or difficulty passing urine -signs of decreased platelets or bleeding - bruising, pinpoint red spots on the skin, black, tarry stools, nosebleeds -signs of decreased red blood cells - unusually weak or tired, fainting spells, lightheadedness -breathing problems -changes in hearing -changes in vision -chest pain -high blood pressure -low blood counts - This drug may decrease the number of white blood cells, red blood cells and platelets. You may be at increased risk for infections and bleeding. -nausea and vomiting -pain, swelling, redness or irritation at the injection site -pain, tingling,  numbness in the hands or feet -problems with balance, talking, walking -trouble passing urine or change in the amount of urine Side effects that usually do not require medical attention (report to your doctor or health care professional if they continue or are bothersome): -hair loss -loss of appetite -metallic taste in the mouth or changes in taste This list may not describe all possible side effects. Call your doctor for medical advice about side effects. You may report side effects to FDA at 1-800-FDA-1088. Where should I keep my medicine? This drug is given in a hospital or clinic and will not be stored at home. NOTE: This sheet is a summary. It may not cover all possible information. If you have questions about this medicine, talk to your doctor, pharmacist, or health care provider.  2017 Elsevier/Gold Standard (2007-10-26 14:38:05)  Etoposide, VP-16 injection What is this medicine? ETOPOSIDE, VP-16 (e toe POE side) is a chemotherapy drug. It is used to treat testicular cancer, lung cancer, and other cancers. This medicine may be used for other purposes; ask your health care provider or pharmacist if you have questions. COMMON BRAND NAME(S): Etopophos, Toposar, VePesid What should I tell my health care provider before I take this medicine? They need to know if you have any of these conditions: -infection -kidney disease -liver disease -low blood counts, like low white cell, platelet, or red cell counts -an unusual or allergic reaction to etoposide, other medicines, foods, dyes, or preservatives -pregnant or trying to get pregnant -breast-feeding How should I use  this medicine? This medicine is for infusion into a vein. It is administered in a hospital or clinic by a specially trained health care professional. Talk to your pediatrician regarding the use of this medicine in children. Special care may be needed. Overdosage: If you think you have taken too much of this medicine  contact a poison control center or emergency room at once. NOTE: This medicine is only for you. Do not share this medicine with others. What if I miss a dose? It is important not to miss your dose. Call your doctor or health care professional if you are unable to keep an appointment. What may interact with this medicine? -aspirin -certain medications for seizures like carbamazepine, phenobarbital, phenytoin, valproic acid -cyclosporine -levamisole -warfarin This list may not describe all possible interactions. Give your health care provider a list of all the medicines, herbs, non-prescription drugs, or dietary supplements you use. Also tell them if you smoke, drink alcohol, or use illegal drugs. Some items may interact with your medicine. What should I watch for while using this medicine? Visit your doctor for checks on your progress. This drug may make you feel generally unwell. This is not uncommon, as chemotherapy can affect healthy cells as well as cancer cells. Report any side effects. Continue your course of treatment even though you feel ill unless your doctor tells you to stop. In some cases, you may be given additional medicines to help with side effects. Follow all directions for their use. Call your doctor or health care professional for advice if you get a fever, chills or sore throat, or other symptoms of a cold or flu. Do not treat yourself. This drug decreases your body's ability to fight infections. Try to avoid being around people who are sick. This medicine may increase your risk to bruise or bleed. Call your doctor or health care professional if you notice any unusual bleeding. Talk to your doctor about your risk of cancer. You may be more at risk for certain types of cancers if you take this medicine. Do not become pregnant while taking this medicine or for at least 6 months after stopping it. Women should inform their doctor if they wish to become pregnant or think they might be  pregnant. Women of child-bearing potential will need to have a negative pregnancy test before starting this medicine. There is a potential for serious side effects to an unborn child. Talk to your health care professional or pharmacist for more information. Do not breast-feed an infant while taking this medicine. Men must use a latex condom during sexual contact with a woman while taking this medicine and for at least 4 months after stopping it. A latex condom is needed even if you have had a vasectomy. Contact your doctor right away if your partner becomes pregnant. Do not donate sperm while taking this medicine and for at least 4 months after you stop taking this medicine. Men should inform their doctors if they wish to father a child. This medicine may lower sperm counts. What side effects may I notice from receiving this medicine? Side effects that you should report to your doctor or health care professional as soon as possible: -allergic reactions like skin rash, itching or hives, swelling of the face, lips, or tongue -low blood counts - this medicine may decrease the number of white blood cells, red blood cells and platelets. You may be at increased risk for infections and bleeding. -signs of infection - fever or chills, cough, sore throat,  pain or difficulty passing urine -signs of decreased platelets or bleeding - bruising, pinpoint red spots on the skin, black, tarry stools, blood in the urine -signs of decreased red blood cells - unusually weak or tired, fainting spells, lightheadedness -breathing problems -changes in vision -mouth or throat sores or ulcers -pain, redness, swelling or irritation at the injection site -pain, tingling, numbness in the hands or feet -redness, blistering, peeling or loosening of the skin, including inside the mouth -seizures -vomiting Side effects that usually do not require medical attention (report to your doctor or health care professional if they continue  or are bothersome): -diarrhea -hair loss -loss of appetite -nausea -stomach pain This list may not describe all possible side effects. Call your doctor for medical advice about side effects. You may report side effects to FDA at 1-800-FDA-1088. Where should I keep my medicine? This drug is given in a hospital or clinic and will not be stored at home. NOTE: This sheet is a summary. It may not cover all possible information. If you have questions about this medicine, talk to your doctor, pharmacist, or health care provider.  2017 Elsevier/Gold Standard (2015-07-13 11:53:23)

## 2016-09-24 NOTE — Telephone Encounter (Signed)
Per staff message I have moved appts, patient aware

## 2016-09-25 ENCOUNTER — Ambulatory Visit (HOSPITAL_COMMUNITY)
Admission: RE | Admit: 2016-09-25 | Discharge: 2016-09-25 | Disposition: A | Discharge status: Home / Self Care | Payer: BLUE CROSS/BLUE SHIELD | Source: Ambulatory Visit | Attending: Internal Medicine | Admitting: Internal Medicine

## 2016-09-25 ENCOUNTER — Other Ambulatory Visit: Payer: BLUE CROSS/BLUE SHIELD

## 2016-09-25 ENCOUNTER — Other Ambulatory Visit: Payer: Self-pay | Admitting: Internal Medicine

## 2016-09-25 ENCOUNTER — Encounter (HOSPITAL_COMMUNITY): Payer: Self-pay

## 2016-09-25 ENCOUNTER — Telehealth: Payer: Self-pay | Admitting: Medical Oncology

## 2016-09-25 ENCOUNTER — Ambulatory Visit (HOSPITAL_COMMUNITY): Payer: BLUE CROSS/BLUE SHIELD

## 2016-09-25 ENCOUNTER — Ambulatory Visit (HOSPITAL_BASED_OUTPATIENT_CLINIC_OR_DEPARTMENT_OTHER): Payer: BLUE CROSS/BLUE SHIELD

## 2016-09-25 VITALS — BP 116/90 | HR 71 | Temp 98.7°F | Resp 20

## 2016-09-25 DIAGNOSIS — C3492 Malignant neoplasm of unspecified part of left bronchus or lung: Secondary | ICD-10-CM | POA: Diagnosis not present

## 2016-09-25 DIAGNOSIS — I10 Essential (primary) hypertension: Secondary | ICD-10-CM

## 2016-09-25 DIAGNOSIS — Z87891 Personal history of nicotine dependence: Secondary | ICD-10-CM | POA: Diagnosis not present

## 2016-09-25 DIAGNOSIS — E1151 Type 2 diabetes mellitus with diabetic peripheral angiopathy without gangrene: Secondary | ICD-10-CM | POA: Insufficient documentation

## 2016-09-25 DIAGNOSIS — I70219 Atherosclerosis of native arteries of extremities with intermittent claudication, unspecified extremity: Secondary | ICD-10-CM | POA: Insufficient documentation

## 2016-09-25 DIAGNOSIS — Z7901 Long term (current) use of anticoagulants: Secondary | ICD-10-CM | POA: Insufficient documentation

## 2016-09-25 DIAGNOSIS — Z8249 Family history of ischemic heart disease and other diseases of the circulatory system: Secondary | ICD-10-CM | POA: Diagnosis not present

## 2016-09-25 DIAGNOSIS — Z8673 Personal history of transient ischemic attack (TIA), and cerebral infarction without residual deficits: Secondary | ICD-10-CM | POA: Insufficient documentation

## 2016-09-25 DIAGNOSIS — Z7189 Other specified counseling: Secondary | ICD-10-CM

## 2016-09-25 DIAGNOSIS — M109 Gout, unspecified: Secondary | ICD-10-CM | POA: Insufficient documentation

## 2016-09-25 DIAGNOSIS — Z510 Encounter for antineoplastic radiation therapy: Secondary | ICD-10-CM | POA: Diagnosis not present

## 2016-09-25 DIAGNOSIS — Z823 Family history of stroke: Secondary | ICD-10-CM | POA: Diagnosis not present

## 2016-09-25 DIAGNOSIS — Z833 Family history of diabetes mellitus: Secondary | ICD-10-CM | POA: Insufficient documentation

## 2016-09-25 DIAGNOSIS — C3412 Malignant neoplasm of upper lobe, left bronchus or lung: Secondary | ICD-10-CM | POA: Diagnosis not present

## 2016-09-25 DIAGNOSIS — E785 Hyperlipidemia, unspecified: Secondary | ICD-10-CM | POA: Insufficient documentation

## 2016-09-25 DIAGNOSIS — G4700 Insomnia, unspecified: Secondary | ICD-10-CM | POA: Insufficient documentation

## 2016-09-25 DIAGNOSIS — I739 Peripheral vascular disease, unspecified: Secondary | ICD-10-CM

## 2016-09-25 HISTORY — PX: IR GENERIC HISTORICAL: IMG1180011

## 2016-09-25 LAB — COMPREHENSIVE METABOLIC PANEL
ALBUMIN: 3.8 g/dL (ref 3.5–5.0)
ALK PHOS: 47 U/L (ref 38–126)
ALT: 26 U/L (ref 17–63)
ANION GAP: 10 (ref 5–15)
AST: 25 U/L (ref 15–41)
BILIRUBIN TOTAL: 0.4 mg/dL (ref 0.3–1.2)
BUN: 17 mg/dL (ref 6–20)
CALCIUM: 10 mg/dL (ref 8.9–10.3)
CO2: 23 mmol/L (ref 22–32)
Chloride: 104 mmol/L (ref 101–111)
Creatinine, Ser: 0.95 mg/dL (ref 0.61–1.24)
GFR calc Af Amer: >60 mL/min (ref >60)
GLUCOSE: 132 mg/dL — AB (ref 65–99)
Potassium: 3.8 mmol/L (ref 3.5–5.1)
Sodium: 137 mmol/L (ref 135–145)
TOTAL PROTEIN: 7.6 g/dL (ref 6.5–8.1)

## 2016-09-25 LAB — CBC
HCT: 33.7 % — ABNORMAL LOW (ref 39.0–52.0)
Hemoglobin: 11.6 g/dL — ABNORMAL LOW (ref 13.0–17.0)
MCH: 30.9 pg (ref 26.0–34.0)
MCHC: 34.4 g/dL (ref 30.0–36.0)
MCV: 89.6 fL (ref 78.0–100.0)
PLATELETS: 250 10*3/uL (ref 150–400)
RBC: 3.76 MIL/uL — AB (ref 4.22–5.81)
RDW: 12.6 % (ref 11.5–15.5)
WBC: 6.5 10*3/uL (ref 4.0–10.5)

## 2016-09-25 LAB — GLUCOSE, CAPILLARY: GLUCOSE-CAPILLARY: 127 mg/dL — AB (ref 65–99)

## 2016-09-25 LAB — PROTIME-INR
INR: 1.02
Prothrombin Time: 13.4 seconds (ref 11.4–15.2)

## 2016-09-25 LAB — APTT: aPTT: 28 seconds (ref 24–36)

## 2016-09-25 MED ORDER — LIDOCAINE HCL 1 % IJ SOLN
INTRAMUSCULAR | Status: AC
Start: 2016-09-25 — End: 2016-09-25
  Filled 2016-09-25: qty 20

## 2016-09-25 MED ORDER — SODIUM CHLORIDE 0.9 % IV SOLN
100.0000 mg/m2 | Freq: Once | INTRAVENOUS | Status: AC
  Administered 2016-09-25: 230 mg via INTRAVENOUS
  Filled 2016-09-25: qty 11.5

## 2016-09-25 MED ORDER — SODIUM CHLORIDE 0.9% FLUSH
10.0000 mL | INTRAVENOUS | Status: DC | PRN
  Administered 2016-09-25: 10 mL
  Filled 2016-09-25: qty 10

## 2016-09-25 MED ORDER — FENTANYL CITRATE (PF) 100 MCG/2ML IJ SOLN
INTRAMUSCULAR | Status: AC
  Filled 2016-09-25: qty 2

## 2016-09-25 MED ORDER — SODIUM CHLORIDE 0.9 % IV SOLN: 10.0000 mg | Freq: Once | INTRAVENOUS | Status: DC

## 2016-09-25 MED ORDER — HEPARIN SOD (PORK) LOCK FLUSH 100 UNIT/ML IV SOLN
500.0000 [IU] | Freq: Once | INTRAVENOUS | Status: AC | PRN
  Administered 2016-09-25: 500 [IU]
  Filled 2016-09-25: qty 5

## 2016-09-25 MED ORDER — DEXAMETHASONE SODIUM PHOSPHATE 10 MG/ML IJ SOLN
INTRAMUSCULAR | Status: AC
  Filled 2016-09-25: qty 1

## 2016-09-25 MED ORDER — MIDAZOLAM HCL 2 MG/2ML IJ SOLN
INTRAMUSCULAR | Status: AC | PRN
Start: 2016-09-25 — End: 2016-09-25
  Administered 2016-09-25 (×4): 1 mg via INTRAVENOUS

## 2016-09-25 MED ORDER — LIDOCAINE HCL 1 % IJ SOLN
INTRAMUSCULAR | Status: AC | PRN
  Administered 2016-09-25: 10 mL

## 2016-09-25 MED ORDER — FENTANYL CITRATE (PF) 100 MCG/2ML IJ SOLN
INTRAMUSCULAR | Status: AC | PRN
  Administered 2016-09-25: 50 ug via INTRAVENOUS
  Administered 2016-09-25: 25 ug via INTRAVENOUS
  Administered 2016-09-25: 50 ug via INTRAVENOUS
  Administered 2016-09-25: 25 ug via INTRAVENOUS

## 2016-09-25 MED ORDER — SODIUM CHLORIDE 0.9 % IV SOLN
INTRAVENOUS | Status: DC
  Administered 2016-09-25: 09:00:00 via INTRAVENOUS

## 2016-09-25 MED ORDER — LIDOCAINE-EPINEPHRINE (PF) 2 %-1:200000 IJ SOLN
INTRAMUSCULAR | Status: AC
  Filled 2016-09-25: qty 20

## 2016-09-25 MED ORDER — MIDAZOLAM HCL 2 MG/2ML IJ SOLN
INTRAMUSCULAR | Status: AC
  Filled 2016-09-25: qty 4

## 2016-09-25 MED ORDER — DEXAMETHASONE SODIUM PHOSPHATE 10 MG/ML IJ SOLN
10.0000 mg | Freq: Once | INTRAMUSCULAR | Status: AC
  Administered 2016-09-25: 10 mg via INTRAVENOUS

## 2016-09-25 MED ORDER — SODIUM CHLORIDE 0.9 % IV SOLN
Freq: Once | INTRAVENOUS | Status: AC
  Administered 2016-09-25: 12:00:00 via INTRAVENOUS

## 2016-09-25 MED ORDER — CEFAZOLIN SODIUM-DEXTROSE 2-4 GM/100ML-% IV SOLN
2.0000 g | Freq: Once | INTRAVENOUS | Status: AC
  Administered 2016-09-25: 2 g via INTRAVENOUS
  Filled 2016-09-25 (×2): qty 100

## 2016-09-25 MED ORDER — LIDOCAINE-EPINEPHRINE (PF) 2 %-1:200000 IJ SOLN
INTRAMUSCULAR | Status: AC | PRN
  Administered 2016-09-25: 10 mL

## 2016-09-25 NOTE — Sedation Documentation (Signed)
Patient is resting comfortably. 

## 2016-09-25 NOTE — Discharge Instructions (Signed)
Moderate Conscious Sedation, Adult, Care After These instructions provide you with information about caring for yourself after your procedure. Your health care provider may also give you more specific instructions. Your treatment has been planned according to current medical practices, but problems sometimes occur. Call your health care provider if you have any problems or questions after your procedure. What can I expect after the procedure? After your procedure, it is common:  To feel sleepy for several hours.  To feel clumsy and have poor balance for several hours.  To have poor judgment for several hours.  To vomit if you eat too soon. Follow these instructions at home: For at least 24 hours after the procedure:   Do not:  Participate in activities where you could fall or become injured.  Drive.  Use heavy machinery.  Drink alcohol.  Take sleeping pills or medicines that cause drowsiness.  Make important decisions or sign legal documents.  Take care of children on your own.  Rest. Eating and drinking  Follow the diet recommended by your health care provider.  If you vomit:  Drink water, juice, or soup when you can drink without vomiting.  Make sure you have little or no nausea before eating solid foods. General instructions  Have a responsible adult stay with you until you are awake and alert.  Take over-the-counter and prescription medicines only as told by your health care provider.  If you smoke, do not smoke without supervision.  Keep all follow-up visits as told by your health care provider. This is important. Contact a health care provider if:  You keep feeling nauseous or you keep vomiting.  You feel light-headed.  You develop a rash.  You have a fever. Get help right away if:  You have trouble breathing. This information is not intended to replace advice given to you by your health care provider. Make sure you discuss any questions you have  with your health care provider. Document Released: 05/11/2013 Document Revised: 12/24/2015 Document Reviewed: 11/10/2015 Elsevier Interactive Patient Education  2017 [place] Insertion, Care After Refer to this sheet in the next few weeks. These instructions provide you with information on caring for yourself after your procedure. Your health care provider may also give you more specific instructions. Your treatment has been planned according to current medical practices, but problems sometimes occur. Call your health care provider if you have any problems or questions after your procedure. WHAT TO EXPECT AFTER THE PROCEDURE After your procedure, it is typical to have the following:   Discomfort at the port insertion site. Ice packs to the area will help.  Bruising on the skin over the port. This will subside in 3-4 days. HOME CARE INSTRUCTIONS  After your port is placed, you will get a manufacturer's information card. The card has information about your port. Keep this card with you at all times.   Know what kind of port you have. There are many types of ports available.   Wear a medical alert bracelet in case of an emergency. This can help alert health care workers that you have a port.   The port can stay in for as long as your health care provider believes it is necessary.   A home health care nurse may give medicines and take care of the port.   You or a family member can get special training and directions for giving medicine and taking care of the port at home.  SEEK MEDICAL CARE IF:  Your port does not flush or you are unable to get a blood return.   You have a fever or chills. SEEK IMMEDIATE MEDICAL CARE IF:  You have new fluid or pus coming from your incision.   You notice a bad smell coming from your incision site.   You have swelling, pain, or more redness at the incision or port site.   You have chest pain or shortness of breath. This  information is not intended to replace advice given to you by your health care provider. Make sure you discuss any questions you have with your health care provider. Document Released: 05/11/2013 Document Revised: 07/26/2013 Document Reviewed: 05/11/2013 Elsevier Interactive Patient Education  2017 [employer].

## 2016-09-25 NOTE — Telephone Encounter (Signed)
I returned pt call. I confirmed his MRI brain.

## 2016-09-25 NOTE — Patient Instructions (Signed)
[facility] Discharge Instructions for Patients Receiving Chemotherapy  Today you received the following chemotherapy agents: Etoposide  To help prevent nausea and vomiting after your treatment, we encourage you to take your nausea medication as prescribed.   If you develop nausea and vomiting that is not controlled by your nausea medication, call the clinic.   BELOW ARE SYMPTOMS THAT SHOULD BE REPORTED IMMEDIATELY:  *FEVER GREATER THAN 100.5 F  *CHILLS WITH OR WITHOUT FEVER  NAUSEA AND VOMITING THAT IS NOT CONTROLLED WITH YOUR NAUSEA MEDICATION  *UNUSUAL SHORTNESS OF BREATH  *UNUSUAL BRUISING OR BLEEDING  TENDERNESS IN MOUTH AND THROAT WITH OR WITHOUT PRESENCE OF ULCERS  *URINARY PROBLEMS  *BOWEL PROBLEMS  UNUSUAL RASH Items with * indicate a potential emergency and should be followed up as soon as possible.  Feel free to call the clinic you have any questions or concerns. The clinic phone number is [phone].  Please show the [facility] at check-in to the Emergency Department and triage nurse.  Implanted Port Insertion, Care After Refer to this sheet in the next few weeks. These instructions provide you with information on caring for yourself after your procedure. Your health care provider may also give you more specific instructions. Your treatment has been planned according to current medical practices, but problems sometimes occur. Call your health care provider if you have any problems or questions after your procedure. WHAT TO EXPECT AFTER THE PROCEDURE After your procedure, it is typical to have the following:   Discomfort at the port insertion site. Ice packs to the area will help.  Bruising on the skin over the port. This will subside in 3-4 days. HOME CARE INSTRUCTIONS  After your port is placed, you will get a manufacturer's information card. The card has information about your port. Keep this card with you at all times.   Know what  kind of port you have. There are many types of ports available.   Wear a medical alert bracelet in case of an emergency. This can help alert health care workers that you have a port.   The port can stay in for as long as your health care provider believes it is necessary.   A home health care nurse may give medicines and take care of the port.   You or a family member can get special training and directions for giving medicine and taking care of the port at home.  SEEK MEDICAL CARE IF:   Your port does not flush or you are unable to get a blood return.   You have a fever or chills. SEEK IMMEDIATE MEDICAL CARE IF:  You have new fluid or pus coming from your incision.   You notice a bad smell coming from your incision site.   You have swelling, pain, or more redness at the incision or port site.   You have chest pain or shortness of breath. This information is not intended to replace advice given to you by your health care provider. Make sure you discuss any questions you have with your health care provider. Document Released: 05/11/2013 Document Revised: 07/26/2013 Document Reviewed: 05/11/2013 Elsevier Interactive Patient Education  2017 [employer].

## 2016-09-25 NOTE — Sedation Documentation (Signed)
Port accessed to the right chest.

## 2016-09-25 NOTE — Progress Notes (Signed)
Cbc and cmp drawn this am, as courtesy for cancer center.  Printed and took to cancer center and gave to receptionist and she stated she would take to infusion nurse.  Pt had 1030 appointment for labs and infusion at 1100, but since pt was with [place] for portacath insertion we went ahead and drew labs.

## 2016-09-25 NOTE — Procedures (Signed)
Successful placement of right IJ port.  Tip at SVC/RA junction.  Minimal blood loss.  No immediate complication.

## 2016-09-25 NOTE — H&P (Signed)
Chief Complaint: lung cancer  Referring Physician:Dr. Curt Bears  Supervising Physician: Markus Daft  Patient Status: [facility] - Out-pt  HPI: Jesse Avila is an 66 y.o. male who was just diagnosed with lung cancer about 2 weeks ago.  He has a dry cough and occasional sputum and bloody production.  He is followed by Dr. Julien Nordmann.  He started chemo yesterday and is schedule for his second session today.  He presents today for [facility] placement to assist with this therapy.  He denies any other complaints.   Past Medical History:  Past Medical History:  Diagnosis Date  . Carotid artery occlusion   . DJD (degenerative joint disease) of knee   . Essential hypertension, benign    takes Benicar daily  . Goals of care, counseling/discussion 09/18/2016  . History of colon polyps    benign  . History of gout   . Hyperlipidemia    takes Fenofibrate and Crestor daily  . Insomnia    takes Ambien nightly as needed  . Joint pain   . Nocturia   . Peripheral vascular disease ([place])   . Primary localized osteoarthritis of left knee 12/26/2014  . Stroke [facility]) 2015?   takes Plavix daily as well as Pletal,not on plavix at present 05/15/15  . 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 ABDOMINAL AORTAGRAM N/A 03/29/2012   Procedure: ABDOMINAL Maxcine Ham;  Surgeon: Angelia Mould, MD;  Location: [facility] CATH LAB;  Service: Cardiovascular;  Laterality: N/A;  . COLONOSCOPY    . ENDARTERECTOMY Right 05/09/2014   Procedure: RIGHT CAROTID ENDARTERECTOMY WITH PATCH ANGIOPLASTY;  Surgeon: Conrad [place], MD;  Location: [place];  Service: Vascular;  Laterality: Right;  . ENDARTERECTOMY FEMORAL Right 05/17/2015   Procedure: ENDARTERECTOMY FEMORAL;  Surgeon: Angelia Mould, MD;  Location: [place];  Service: Vascular;   Laterality: Right;  . ENDOBRONCHIAL ULTRASOUND Bilateral 09/08/2016   Procedure: ENDOBRONCHIAL ULTRASOUND;  Surgeon: Rigoberto Noel, MD;  Location: [facility] ENDOSCOPY;  Service: Cardiopulmonary;  Laterality: Bilateral;  . FEMORAL-POPLITEAL BYPASS GRAFT Right 05/17/2015   Procedure: BYPASS GRAFT FEMORAL-BELOW KNEE POPLITEAL ARTERY, using gortex propaten graft 6 mm x 80 cm;  Surgeon: Angelia Mould, MD;  Location: [place];  Service: Vascular;  Laterality: Right;  . KNEE ARTHROSCOPY Right 11/2009  . LOWER EXTREMITY ANGIOGRAM Bilateral 03/23/2015   Procedure: Lower Extremity Angiogram;  Surgeon: Angelia Mould, MD;  Location: [place] CV LAB;  Service: Cardiovascular;  Laterality: Bilateral;  . MENISCUS REPAIR  11/2009  . PARTIAL KNEE ARTHROPLASTY Left 12/26/2014   Procedure: UNICOMPARTMENTAL KNEE;  Surgeon: Marchia Bond, MD;  Location: [place];  Service: Orthopedics;  Laterality: Left;  . PATCH ANGIOPLASTY Right 05/17/2015   Procedure: VEIN PATCH ANGIOPLASTY ILEOFEMORAL ARTERY;  Surgeon: Angelia Mould, MD;  Location: [place];  Service: Vascular;  Laterality: Right;  . PERCUTANEOUS STENT INTERVENTION N/A 05/10/2012   Procedure: PERCUTANEOUS STENT INTERVENTION;  Surgeon: Angelia Mould, MD;  Location: [facility] CATH LAB;  Service: Cardiovascular;  Laterality: N/A;  . PERIPHERAL VASCULAR CATHETERIZATION N/A 03/23/2015   Procedure: Abdominal Aortogram;  Surgeon: Angelia Mould, MD;  Location: [place] CV LAB;  Service: Cardiovascular;  Laterality: N/A;  . STENTS     PLACED IN ??BOTH LEGS   2013?    Family History:  Family History  Problem Relation Age of Onset  . Diabetes Mother   . Hypertension Mother   . Heart disease Mother     Coronary Artery Bypass Graft  . Hyperlipidemia Mother   . Heart attack Mother   . Hypertension Father   . Hyperlipidemia Sister   . Stroke Sister     Social History:  reports that he has quit smoking. He smoked 0.00 packs per day for 0.00 years.  He has never used smokeless tobacco. He reports that he drinks alcohol. He reports that he does not use drugs.  Allergies:  Allergies  Allergen Reactions  . Cialis [Tadalafil] Other (See Comments)    Headache    Medications: Medications reviewed in epic  Please HPI for pertinent positives, otherwise complete 10 system ROS negative.  Mallampati Score: MD Evaluation Airway: WNL Heart: WNL Abdomen: WNL Chest/ Lungs: WNL ASA  Classification: 3 Mallampati/Airway Score: Two  Physical Exam: BP (!) 164/80 (BP Location: Right Arm)   Pulse 68   Temp 98.5 F (36.9 C) (Oral)   Resp 18   SpO2 96%  There is no height or weight on file to calculate BMI. General: pleasant, WD, WN white male who is laying in bed in NAD HEENT: head is normocephalic, atraumatic.  Sclera are noninjected.  PERRL.  Ears and nose without any masses or lesions.  Mouth is pink and moist Heart: regular, rate, and rhythm.  Normal s1,s2. No obvious murmurs, gallops, or rubs noted.  Palpable radial and pedal pulses bilaterally Lungs: CTAB, no wheezes, rhonchi, or rales noted.  Respiratory effort nonlabored Abd: soft, NT, obese, +BS, no masses, hernias, or organomegaly Psych: A&Ox3 with an appropriate affect.   Labs: Results for orders placed or performed during the hospital encounter of 09/25/16 (from the past 48 hour(s))  APTT upon arrival     Status: None   Collection Time: 09/25/16  7:59 AM  Result Value Ref Range   aPTT 28 24 - 36 seconds  CBC upon arrival     Status: Abnormal   Collection Time: 09/25/16  7:59 AM  Result Value Ref Range   WBC 6.5 4.0 - 10.5 K/uL   RBC 3.76 (L) 4.22 - 5.81 MIL/uL   Hemoglobin 11.6 (L) 13.0 - 17.0 g/dL   HCT 33.7 (L) 39.0 - 52.0 %   MCV 89.6 78.0 - 100.0 fL   MCH 30.9 26.0 - 34.0 pg   MCHC 34.4 30.0 - 36.0 g/dL   RDW 12.6 11.5 - 15.5 %   Platelets 250 150 - 400 K/uL  Protime-INR upon arrival     Status: None   Collection Time: 09/25/16  7:59 AM  Result Value Ref Range     Prothrombin Time 13.4 11.4 - 15.2 seconds   INR 1.02   Comprehensive metabolic panel     Status: Abnormal   Collection Time: 09/25/16  7:59 AM  Result Value Ref Range   Sodium 137 135 - 145 mmol/L   Potassium 3.8 3.5 - 5.1 mmol/L   Chloride 104 101 - 111 mmol/L   CO2 23 22 - 32 mmol/L   Glucose, Bld 132 (H) 65 - 99 mg/dL   BUN 17 6 - 20 mg/dL   Creatinine, Ser 0.95 0.61 - 1.24 mg/dL   Calcium 10.0 8.9 - 10.3 mg/dL   Total Protein 7.6 6.5 - 8.1 g/dL   Albumin 3.8 3.5 - 5.0 g/dL   AST 25 15 - 41 U/L   ALT 26 17 - 63 U/L   Alkaline  Phosphatase 47 38 - 126 U/L   Total Bilirubin 0.4 0.3 - 1.2 mg/dL   GFR calc non Af Amer >60 >60 mL/min   GFR calc Af Amer >60 >60 mL/min    Comment: (NOTE) The eGFR has been calculated using the CKD EPI equation. This calculation has not been validated in all clinical situations. eGFR's persistently <60 mL/min signify possible Chronic Kidney Disease.    Anion gap 10 5 - 15    Imaging: No results found.  Assessment/Plan 1. Lung cancer, on left -we will plan to pursue PAC placement today -he was scheduled for a lab draw after this procedure before chemo so we have gone ahead and drawn his CMET while getting his other labs -labs and vitals reviewed -Risks and Benefits discussed with the patient including, but not limited to bleeding, infection, pneumothorax, or fibrin sheath development and need for additional procedures. All of the patient's questions were answered, patient is agreeable to proceed. Consent signed and in chart.  Thank you for this interesting consult.  I greatly enjoyed meeting KASHTEN GOWIN and look forward to participating in their care.  A copy of this report was sent to the requesting provider on this date.  Electronically Signed: Henreitta Cea 09/25/2016, 8:58 AM   I spent a total of  30 Minutes   in face to face in clinical consultation, greater than 50% of which was counseling/coordinating care for lung cancer

## 2016-09-26 ENCOUNTER — Ambulatory Visit: Payer: BLUE CROSS/BLUE SHIELD

## 2016-09-26 ENCOUNTER — Ambulatory Visit (HOSPITAL_BASED_OUTPATIENT_CLINIC_OR_DEPARTMENT_OTHER): Payer: BLUE CROSS/BLUE SHIELD

## 2016-09-26 ENCOUNTER — Ambulatory Visit (HOSPITAL_COMMUNITY)
Admission: RE | Admit: 2016-09-26 | Discharge: 2016-09-26 | Disposition: A | Discharge status: Home / Self Care | Payer: BLUE CROSS/BLUE SHIELD | Source: Ambulatory Visit | Attending: Internal Medicine | Admitting: Internal Medicine

## 2016-09-26 DIAGNOSIS — I739 Peripheral vascular disease, unspecified: Secondary | ICD-10-CM | POA: Diagnosis not present

## 2016-09-26 DIAGNOSIS — C3492 Malignant neoplasm of unspecified part of left bronchus or lung: Secondary | ICD-10-CM | POA: Diagnosis not present

## 2016-09-26 DIAGNOSIS — I10 Essential (primary) hypertension: Secondary | ICD-10-CM | POA: Diagnosis not present

## 2016-09-26 DIAGNOSIS — C3412 Malignant neoplasm of upper lobe, left bronchus or lung: Secondary | ICD-10-CM | POA: Diagnosis not present

## 2016-09-26 DIAGNOSIS — Z5111 Encounter for antineoplastic chemotherapy: Secondary | ICD-10-CM

## 2016-09-26 DIAGNOSIS — Z8673 Personal history of transient ischemic attack (TIA), and cerebral infarction without residual deficits: Secondary | ICD-10-CM | POA: Diagnosis not present

## 2016-09-26 DIAGNOSIS — Z7189 Other specified counseling: Secondary | ICD-10-CM | POA: Diagnosis not present

## 2016-09-26 MED ORDER — HEPARIN SOD (PORK) LOCK FLUSH 100 UNIT/ML IV SOLN
500.0000 [IU] | Freq: Once | INTRAVENOUS | Status: AC | PRN
  Administered 2016-09-26: 500 [IU]
  Filled 2016-09-26: qty 5

## 2016-09-26 MED ORDER — DEXAMETHASONE SODIUM PHOSPHATE 10 MG/ML IJ SOLN
10.0000 mg | Freq: Once | INTRAMUSCULAR | Status: AC
  Administered 2016-09-26: 10 mg via INTRAVENOUS

## 2016-09-26 MED ORDER — SODIUM CHLORIDE 0.9% FLUSH
10.0000 mL | INTRAVENOUS | Status: DC | PRN
  Administered 2016-09-26: 10 mL
  Filled 2016-09-26: qty 10

## 2016-09-26 MED ORDER — DEXAMETHASONE SODIUM PHOSPHATE 10 MG/ML IJ SOLN
INTRAMUSCULAR | Status: AC
  Filled 2016-09-26: qty 1

## 2016-09-26 MED ORDER — SODIUM CHLORIDE 0.9 % IV SOLN
100.0000 mg/m2 | Freq: Once | INTRAVENOUS | Status: AC
  Administered 2016-09-26: 230 mg via INTRAVENOUS
  Filled 2016-09-26: qty 11.5

## 2016-09-26 MED ORDER — GADOBENATE DIMEGLUMINE 529 MG/ML IV SOLN
20.0000 mL | Freq: Once | INTRAVENOUS | Status: AC | PRN
  Administered 2016-09-26: 20 mL via INTRAVENOUS

## 2016-09-26 MED ORDER — SODIUM CHLORIDE 0.9 % IV SOLN
Freq: Once | INTRAVENOUS | Status: AC
  Administered 2016-09-26: 11:00:00 via INTRAVENOUS

## 2016-09-26 NOTE — Patient Instructions (Signed)
[place] Discharge Instructions for Patients Receiving Chemotherapy  Today you received the following chemotherapy agents: Etoposide  To help prevent nausea and vomiting after your treatment, we encourage you to take your nausea medication as prescribed.   If you develop nausea and vomiting that is not controlled by your nausea medication, call the clinic.   BELOW ARE SYMPTOMS THAT SHOULD BE REPORTED IMMEDIATELY:  *FEVER GREATER THAN 100.5 F  *CHILLS WITH OR WITHOUT FEVER  NAUSEA AND VOMITING THAT IS NOT CONTROLLED WITH YOUR NAUSEA MEDICATION  *UNUSUAL SHORTNESS OF BREATH  *UNUSUAL BRUISING OR BLEEDING  TENDERNESS IN MOUTH AND THROAT WITH OR WITHOUT PRESENCE OF ULCERS  *URINARY PROBLEMS  *BOWEL PROBLEMS  UNUSUAL RASH Items with * indicate a potential emergency and should be followed up as soon as possible.  Feel free to call the clinic you have any questions or concerns. The clinic phone number is [phone].  Please show the [place] at check-in to the Emergency Department and triage nurse.  Implanted Port Insertion, Care After Refer to this sheet in the next few weeks. These instructions provide you with information on caring for yourself after your procedure. Your health care provider may also give you more specific instructions. Your treatment has been planned according to current medical practices, but problems sometimes occur. Call your health care provider if you have any problems or questions after your procedure. WHAT TO EXPECT AFTER THE PROCEDURE After your procedure, it is typical to have the following:   Discomfort at the port insertion site. Ice packs to the area will help.  Bruising on the skin over the port. This will subside in 3-4 days. HOME CARE INSTRUCTIONS  After your port is placed, you will get a manufacturer's information card. The card has information about your port. Keep this card with you at all times.   Know what  kind of port you have. There are many types of ports available.   Wear a medical alert bracelet in case of an emergency. This can help alert health care workers that you have a port.   The port can stay in for as long as your health care provider believes it is necessary.   A home health care nurse may give medicines and take care of the port.   You or a family member can get special training and directions for giving medicine and taking care of the port at home.  SEEK MEDICAL CARE IF:   Your port does not flush or you are unable to get a blood return.   You have a fever or chills. SEEK IMMEDIATE MEDICAL CARE IF:  You have new fluid or pus coming from your incision.   You notice a bad smell coming from your incision site.   You have swelling, pain, or more redness at the incision or port site.   You have chest pain or shortness of breath. This information is not intended to replace advice given to you by your health care provider. Make sure you discuss any questions you have with your health care provider. Document Released: 05/11/2013 Document Revised: 07/26/2013 Document Reviewed: 05/11/2013 Elsevier Interactive Patient Education  2017 [employer].

## 2016-09-27 ENCOUNTER — Ambulatory Visit (HOSPITAL_BASED_OUTPATIENT_CLINIC_OR_DEPARTMENT_OTHER): Payer: BLUE CROSS/BLUE SHIELD

## 2016-09-27 VITALS — BP 149/87 | HR 78 | Temp 98.3°F | Resp 18

## 2016-09-27 DIAGNOSIS — C3412 Malignant neoplasm of upper lobe, left bronchus or lung: Secondary | ICD-10-CM

## 2016-09-27 DIAGNOSIS — C3492 Malignant neoplasm of unspecified part of left bronchus or lung: Secondary | ICD-10-CM

## 2016-09-27 DIAGNOSIS — Z5189 Encounter for other specified aftercare: Secondary | ICD-10-CM

## 2016-09-27 MED ORDER — PEGFILGRASTIM INJECTION 6 MG/0.6ML ~~LOC~~
6.0000 mg | PREFILLED_SYRINGE | Freq: Once | SUBCUTANEOUS | Status: AC
  Administered 2016-09-27: 6 mg via SUBCUTANEOUS

## 2016-09-27 NOTE — Patient Instructions (Signed)
Pegfilgrastim injection What is this medicine? PEGFILGRASTIM (PEG fil gra stim) is a long-acting granulocyte colony-stimulating factor that stimulates the growth of neutrophils, a type of white blood cell important in the body's fight against infection. It is used to reduce the incidence of fever and infection in patients with certain types of cancer who are receiving chemotherapy that affects the bone marrow, and to increase survival after being exposed to high doses of radiation. This medicine may be used for other purposes; ask your health care provider or pharmacist if you have questions. COMMON BRAND NAME(S): Neulasta What should I tell my health care provider before I take this medicine? They need to know if you have any of these conditions: -kidney disease -latex allergy -ongoing radiation therapy -sickle cell disease -skin reactions to acrylic adhesives (On-Body Injector only) -an unusual or allergic reaction to pegfilgrastim, filgrastim, other medicines, foods, dyes, or preservatives -pregnant or trying to get pregnant -breast-feeding How should I use this medicine? This medicine is for injection under the skin. If you get this medicine at home, you will be taught how to prepare and give the pre-filled syringe or how to use the On-body Injector. Refer to the patient Instructions for Use for detailed instructions. Use exactly as directed. Take your medicine at regular intervals. Do not take your medicine more often than directed. It is important that you put your used needles and syringes in a special sharps container. Do not put them in a trash can. If you do not have a sharps container, call your pharmacist or healthcare provider to get one. Talk to your pediatrician regarding the use of this medicine in children. While this drug may be prescribed for selected conditions, precautions do apply. Overdosage: If you think you have taken too much of this medicine contact a poison control  center or emergency room at once. NOTE: This medicine is only for you. Do not share this medicine with others. What if I miss a dose? It is important not to miss your dose. Call your doctor or health care professional if you miss your dose. If you miss a dose due to an On-body Injector failure or leakage, a new dose should be administered as soon as possible using a single prefilled syringe for manual use. What may interact with this medicine? Interactions have not been studied. Give your health care provider a list of all the medicines, herbs, non-prescription drugs, or dietary supplements you use. Also tell them if you smoke, drink alcohol, or use illegal drugs. Some items may interact with your medicine. This list may not describe all possible interactions. Give your health care provider a list of all the medicines, herbs, non-prescription drugs, or dietary supplements you use. Also tell them if you smoke, drink alcohol, or use illegal drugs. Some items may interact with your medicine. What should I watch for while using this medicine? You may need blood work done while you are taking this medicine. If you are going to need a MRI, CT scan, or other procedure, tell your doctor that you are using this medicine (On-Body Injector only). What side effects may I notice from receiving this medicine? Side effects that you should report to your doctor or health care professional as soon as possible: -allergic reactions like skin rash, itching or hives, swelling of the face, lips, or tongue -dizziness -fever -pain, redness, or irritation at site where injected -pinpoint red spots on the skin -red or dark-brown urine -shortness of breath or breathing problems -stomach or   side pain, or pain at the shoulder -swelling -tiredness -trouble passing urine or change in the amount of urine Side effects that usually do not require medical attention (report to your doctor or health care professional if they  continue or are bothersome): -bone pain -muscle pain This list may not describe all possible side effects. Call your doctor for medical advice about side effects. You may report side effects to FDA at 1-800-FDA-1088. Where should I keep my medicine? Keep out of the reach of children. Store pre-filled syringes in a refrigerator between 2 and 8 degrees C (36 and 46 degrees F). Do not freeze. Keep in carton to protect from light. Throw away this medicine if it is left out of the refrigerator for more than 48 hours. Throw away any unused medicine after the expiration date. NOTE: This sheet is a summary. It may not cover all possible information. If you have questions about this medicine, talk to your doctor, pharmacist, or health care provider.  2017 Elsevier/Gold Standard (2014-08-10 14:30:14)  

## 2016-09-29 ENCOUNTER — Ambulatory Visit: Payer: BLUE CROSS/BLUE SHIELD

## 2016-09-29 ENCOUNTER — Ambulatory Visit
Admission: RE | Admit: 2016-09-29 | Payer: BLUE CROSS/BLUE SHIELD | Source: Ambulatory Visit | Admitting: Radiation Oncology

## 2016-09-30 ENCOUNTER — Ambulatory Visit
Admission: RE | Admit: 2016-09-30 | Discharge: 2016-09-30 | Disposition: A | Discharge status: Home / Self Care | Payer: BLUE CROSS/BLUE SHIELD | Source: Ambulatory Visit | Attending: Radiation Oncology | Admitting: Radiation Oncology

## 2016-09-30 ENCOUNTER — Encounter: Payer: Self-pay | Admitting: Radiation Oncology

## 2016-09-30 VITALS — BP 141/81 | HR 106 | Temp 98.4°F | Ht 72.0 in | Wt 218.0 lb

## 2016-09-30 DIAGNOSIS — Z510 Encounter for antineoplastic radiation therapy: Secondary | ICD-10-CM | POA: Diagnosis not present

## 2016-09-30 DIAGNOSIS — C3492 Malignant neoplasm of unspecified part of left bronchus or lung: Secondary | ICD-10-CM

## 2016-09-30 MED ORDER — BIAFINE EX EMUL
Freq: Once | CUTANEOUS | Status: AC
  Administered 2016-09-30: 17:00:00 via TOPICAL

## 2016-09-30 NOTE — Progress Notes (Signed)
  Radiation Oncology         [phone] ________________________________  Name: Jesse Avila MRN: [id number]  Date: 09/30/2016  DOB: Dec 19, 1950  Weekly Radiation Therapy Management    ICD-9-CM ICD-10-CM   1. Small cell lung carcinoma, left (HCC) 162.9 C34.92 topical emolient (BIAFINE) emulsion     Current Dose: 2 Gy     Planned Dose:  60 Gy  Narrative . . . . . . . . The patient presents for routine under treatment assessment.                                    Jesse Avila presents after his 1st fraction of radiation to his Left Lung. He denies pain, difficulty swallowing, or fatigue. He has hoarse voice. He does ask about results of an MRI brain performed on 09/26/16 that was orderd by Dr. Julien Avila. This showed no evidence of disease. He was provided education today and instructed to use Biafine cream twice daily.                                  Set-up films were reviewed.                                 The chart was checked. Physical Findings. . .  height is 6' (1.829 m) and weight is 218 lb (98.9 kg). His temperature is 98.4 F (36.9 C). His blood pressure is 141/81 (abnormal) and his pulse is 106 (abnormal). His oxygen saturation is 98%.   Lungs are clear to auscultation bilaterally. Heart has regular rate and rhythm.  Impression . . . . . . . The patient is tolerating radiation. The patient's tumor appear to be responding to his chemotherapy on treatment imaging. Plan . . . . . . . . . . . . Continue treatment as planned. ________________________________   Jesse Promise, PhD, MD  This document serves as a record of services personally performed by Jesse Pray, MD. It was created on his behalf by Jesse Avila, a trained medical scribe. The creation of this record is based on the scribe's personal observations and the provider's statements to them. This document has been checked and approved by the attending provider.

## 2016-09-30 NOTE — Progress Notes (Signed)
  Radiation Oncology         [phone] ________________________________  Name: Jesse Avila MRN: [id number]  Date: 09/30/2016  DOB: 01-Aug-1951  Simulation Verification Note    ICD-9-CM ICD-10-CM   1. Small cell lung carcinoma, left (HCC) 162.9 C34.92     Status: outpatient  NARRATIVE: The patient was brought to the treatment unit and placed in the planned treatment position. The clinical setup was verified. Then port films were obtained and uploaded to the radiation oncology medical record software.  The treatment beams were carefully compared against the planned radiation fields. The position location and shape of the radiation fields was reviewed. They targeted volume of tissue appears to be appropriately covered by the radiation beams. Organs at risk appear to be excluded as planned.  Based on my personal review, I approved the simulation verification. The patient's treatment will proceed as planned.  -----------------------------------  Blair Promise, PhD, MD

## 2016-09-30 NOTE — Progress Notes (Signed)
Jesse Avila presents after his 1st fraction of radiation to his Left Lung. He denies pain or fatigue. He denies any concerns at this time. He does ask about results of an MRI brain completed 09/26/26 that was orderd by Dr. Earlie Server. He was provided education today and instructed to use Biafine cream twice daily.   BP (!) 141/81   Pulse (!) 106   Temp 98.4 F (36.9 C)   Ht 6' (1.829 m)   Wt 218 lb (98.9 kg)   SpO2 98% Comment: room air  BMI 29.57 kg/m    Wt Readings from Last 3 Encounters:  09/30/16 218 lb (98.9 kg)  09/21/16 223 lb (101.2 kg)  09/18/16 223 lb 8 oz (101.4 kg)

## 2016-10-01 ENCOUNTER — Ambulatory Visit
Admission: RE | Admit: 2016-10-01 | Discharge: 2016-10-01 | Disposition: A | Discharge status: Home / Self Care | Payer: BLUE CROSS/BLUE SHIELD | Source: Ambulatory Visit | Attending: Radiation Oncology | Admitting: Radiation Oncology

## 2016-10-01 DIAGNOSIS — Z510 Encounter for antineoplastic radiation therapy: Secondary | ICD-10-CM | POA: Diagnosis not present

## 2016-10-01 DIAGNOSIS — C3492 Malignant neoplasm of unspecified part of left bronchus or lung: Secondary | ICD-10-CM

## 2016-10-01 NOTE — Progress Notes (Signed)
Pt here for patient teaching.  Pt given Radiation and You booklet, skin care instructions and Sonafine.  Reviewed areas of pertinence such as fatigue, hair loss, skin changes, throat changes, cough and shortness of breath . Pt able to give teach back of to pat skin, use unscented/gentle soap, have Imodium on hand and drink plenty of water,apply Sonafine bid and avoid applying anything to skin within 4 hours of treatment. Pt verbalizes understanding of information given and will contact nursing with any questions or concerns.     [email or web address]/whattoexpect/index

## 2016-10-02 ENCOUNTER — Ambulatory Visit
Admission: RE | Admit: 2016-10-02 | Discharge: 2016-10-02 | Disposition: A | Discharge status: Home / Self Care | Payer: BLUE CROSS/BLUE SHIELD | Source: Ambulatory Visit | Attending: Radiation Oncology | Admitting: Radiation Oncology

## 2016-10-02 DIAGNOSIS — Z510 Encounter for antineoplastic radiation therapy: Secondary | ICD-10-CM | POA: Diagnosis not present

## 2016-10-03 ENCOUNTER — Ambulatory Visit
Admission: RE | Admit: 2016-10-03 | Discharge: 2016-10-03 | Disposition: A | Discharge status: Home / Self Care | Payer: BLUE CROSS/BLUE SHIELD | Source: Ambulatory Visit | Attending: Radiation Oncology | Admitting: Radiation Oncology

## 2016-10-03 DIAGNOSIS — Z510 Encounter for antineoplastic radiation therapy: Secondary | ICD-10-CM | POA: Diagnosis not present

## 2016-10-06 ENCOUNTER — Ambulatory Visit
Admission: RE | Admit: 2016-10-06 | Discharge: 2016-10-06 | Disposition: A | Discharge status: Home / Self Care | Payer: BLUE CROSS/BLUE SHIELD | Source: Ambulatory Visit | Attending: Radiation Oncology | Admitting: Radiation Oncology

## 2016-10-06 DIAGNOSIS — Z510 Encounter for antineoplastic radiation therapy: Secondary | ICD-10-CM | POA: Diagnosis not present

## 2016-10-07 ENCOUNTER — Encounter: Payer: Self-pay | Admitting: Oncology

## 2016-10-07 ENCOUNTER — Ambulatory Visit
Admission: RE | Admit: 2016-10-07 | Discharge: 2016-10-07 | Disposition: A | Discharge status: Home / Self Care | Payer: BLUE CROSS/BLUE SHIELD | Source: Ambulatory Visit | Attending: Radiation Oncology | Admitting: Radiation Oncology

## 2016-10-07 ENCOUNTER — Encounter: Payer: Self-pay | Admitting: Radiation Oncology

## 2016-10-07 VITALS — BP 129/78 | HR 75 | Temp 99.3°F | Ht 72.0 in | Wt 217.0 lb

## 2016-10-07 DIAGNOSIS — C3492 Malignant neoplasm of unspecified part of left bronchus or lung: Secondary | ICD-10-CM

## 2016-10-07 DIAGNOSIS — Z510 Encounter for antineoplastic radiation therapy: Secondary | ICD-10-CM | POA: Diagnosis not present

## 2016-10-07 NOTE — Progress Notes (Signed)
Dearis has completed 6 fractions to his left lung.  He denies having pain, sore throat or shortness of breath.  He reports having an occasional dry cough.  He reports having a slight increase in fatigue.  He said he needs to have a tooth pulled and needs a letter sent to his dentist saying it is OK to have it done while having radiation.  BP 129/78 (BP Location: Right Arm, Patient Position: Sitting)   Pulse 75   Temp 99.3 F (37.4 C) (Oral)   Ht 6' (1.829 m)   Wt 217 lb (98.4 kg)   SpO2 97%   BMI 29.43 kg/m    Wt Readings from Last 3 Encounters:  10/07/16 217 lb (98.4 kg)  09/30/16 218 lb (98.9 kg)  09/21/16 223 lb (101.2 kg)

## 2016-10-07 NOTE — Progress Notes (Signed)
  Radiation Oncology         [phone] ________________________________  Name: Jesse Avila MRN: [id number]  Date: 10/07/2016  DOB: 09-Oct-1950  Weekly Radiation Therapy Management    ICD-9-CM ICD-10-CM   1. Small cell lung carcinoma, left (HCC) 162.9 C34.92      Current Dose: 12 Gy     Planned Dose:  60 Gy  Narrative . . . . . . . . The patient presents for routine under treatment assessment.                                   Jesse Avila has completed 6 fractions to his left lung.  He denies having pain, a sore throat, difficulty swallowing, or shortness of breath. He reports having an occasional dry cough and a slight increase in fatigue. He said he needs to have a tooth pulled and needs a letter sent to his dentist saying it is OK to have it done while having radiation.                                  Set-up films were reviewed.                                 The chart was checked. Physical Findings. . .  height is 6' (1.829 m) and weight is 217 lb (98.4 kg). His oral temperature is 99.3 F (37.4 C). His blood pressure is 129/78 and his pulse is 75. His oxygen saturation is 97%.   Lungs are clear to auscultation bilaterally. Heart has regular rate and rhythm.  Impression . . . . . . . The patient is tolerating radiation. The patient's tumor appear to be responding to his chemotherapy and radiation on treatment imaging. Plan . . . . . . . . . . . . Continue treatment as planned. We need to re-plan the patient's treatment since his mass shrunk significantly. This is in progress at this time.  The patient needs approval from Dr. Julien Nordmann in regards to getting his tooth pulled in light of his ongoing chemotherapy. ________________________________   Blair Promise, PhD, MD  This document serves as a record of services personally performed by Gery Pray, MD. It was created on his behalf by Darcus Austin, a trained medical scribe. The creation of this record is based on the scribe's personal  observations and the provider's statements to them. This document has been checked and approved by the attending provider.

## 2016-10-08 ENCOUNTER — Ambulatory Visit
Admission: RE | Admit: 2016-10-08 | Discharge: 2016-10-08 | Disposition: A | Discharge status: Home / Self Care | Payer: BLUE CROSS/BLUE SHIELD | Source: Ambulatory Visit | Attending: Radiation Oncology | Admitting: Radiation Oncology

## 2016-10-08 ENCOUNTER — Telehealth: Payer: Self-pay | Admitting: Oncology

## 2016-10-08 ENCOUNTER — Telehealth: Payer: Self-pay | Admitting: Medical Oncology

## 2016-10-08 DIAGNOSIS — Z510 Encounter for antineoplastic radiation therapy: Secondary | ICD-10-CM | POA: Diagnosis not present

## 2016-10-08 LAB — FUNGAL ORGANISM REFLEX

## 2016-10-08 LAB — FUNGUS CULTURE WITH STAIN

## 2016-10-08 LAB — FUNGUS CULTURE RESULT

## 2016-10-08 NOTE — Telephone Encounter (Signed)
Left a message for Dr. Worthy Flank nurse to see if patient can get a release to have a tooth pulled.  Requested a return call.

## 2016-10-08 NOTE — Telephone Encounter (Signed)
Pt needs letter from [facility] for okay for dentist to pull tooth. Last chemo 2/24-next chemo 3/12

## 2016-10-08 NOTE — Telephone Encounter (Signed)
No not during his treatment unless emergency

## 2016-10-09 ENCOUNTER — Ambulatory Visit
Admission: RE | Admit: 2016-10-09 | Discharge: 2016-10-09 | Disposition: A | Discharge status: Home / Self Care | Payer: BLUE CROSS/BLUE SHIELD | Source: Ambulatory Visit | Attending: Radiation Oncology | Admitting: Radiation Oncology

## 2016-10-09 ENCOUNTER — Telehealth: Payer: Self-pay | Admitting: Medical Oncology

## 2016-10-09 DIAGNOSIS — Z510 Encounter for antineoplastic radiation therapy: Secondary | ICD-10-CM | POA: Diagnosis not present

## 2016-10-09 NOTE — Telephone Encounter (Addendum)
Pt notified that Dr Julien Nordmann does not want him to have tooth pulled unless it is an emergency. I told pt to have dentist call us for concerns.

## 2016-10-10 ENCOUNTER — Ambulatory Visit
Admission: RE | Admit: 2016-10-10 | Discharge: 2016-10-10 | Disposition: A | Discharge status: Home / Self Care | Payer: BLUE CROSS/BLUE SHIELD | Source: Ambulatory Visit | Attending: Radiation Oncology | Admitting: Radiation Oncology

## 2016-10-10 DIAGNOSIS — Z510 Encounter for antineoplastic radiation therapy: Secondary | ICD-10-CM | POA: Diagnosis not present

## 2016-10-13 ENCOUNTER — Telehealth: Payer: Self-pay | Admitting: Oncology

## 2016-10-13 ENCOUNTER — Ambulatory Visit
Admission: RE | Admit: 2016-10-13 | Discharge: 2016-10-13 | Disposition: A | Discharge status: Home / Self Care | Payer: BLUE CROSS/BLUE SHIELD | Source: Ambulatory Visit | Attending: Radiation Oncology | Admitting: Radiation Oncology

## 2016-10-13 ENCOUNTER — Encounter: Payer: Self-pay | Admitting: *Deleted

## 2016-10-13 ENCOUNTER — Encounter: Payer: Self-pay | Admitting: Oncology

## 2016-10-13 ENCOUNTER — Ambulatory Visit (HOSPITAL_BASED_OUTPATIENT_CLINIC_OR_DEPARTMENT_OTHER): Payer: BLUE CROSS/BLUE SHIELD

## 2016-10-13 ENCOUNTER — Ambulatory Visit: Payer: BLUE CROSS/BLUE SHIELD

## 2016-10-13 ENCOUNTER — Other Ambulatory Visit (HOSPITAL_BASED_OUTPATIENT_CLINIC_OR_DEPARTMENT_OTHER): Payer: BLUE CROSS/BLUE SHIELD

## 2016-10-13 ENCOUNTER — Ambulatory Visit (HOSPITAL_BASED_OUTPATIENT_CLINIC_OR_DEPARTMENT_OTHER): Payer: BLUE CROSS/BLUE SHIELD | Admitting: Oncology

## 2016-10-13 VITALS — BP 116/63 | HR 80 | Temp 98.2°F | Resp 18 | Wt 220.7 lb

## 2016-10-13 DIAGNOSIS — Z7189 Other specified counseling: Secondary | ICD-10-CM

## 2016-10-13 DIAGNOSIS — Z5111 Encounter for antineoplastic chemotherapy: Secondary | ICD-10-CM | POA: Diagnosis not present

## 2016-10-13 DIAGNOSIS — C3492 Malignant neoplasm of unspecified part of left bronchus or lung: Secondary | ICD-10-CM

## 2016-10-13 DIAGNOSIS — C7951 Secondary malignant neoplasm of bone: Secondary | ICD-10-CM | POA: Diagnosis not present

## 2016-10-13 DIAGNOSIS — Z510 Encounter for antineoplastic radiation therapy: Secondary | ICD-10-CM | POA: Diagnosis not present

## 2016-10-13 DIAGNOSIS — C3412 Malignant neoplasm of upper lobe, left bronchus or lung: Secondary | ICD-10-CM

## 2016-10-13 DIAGNOSIS — I10 Essential (primary) hypertension: Secondary | ICD-10-CM

## 2016-10-13 DIAGNOSIS — I739 Peripheral vascular disease, unspecified: Secondary | ICD-10-CM

## 2016-10-13 DIAGNOSIS — Z95828 Presence of other vascular implants and grafts: Secondary | ICD-10-CM

## 2016-10-13 LAB — COMPREHENSIVE METABOLIC PANEL
ALT: 17 U/L (ref 0–55)
AST: 14 U/L (ref 5–34)
Albumin: 3.6 g/dL (ref 3.5–5.0)
Alkaline Phosphatase: 68 U/L (ref 40–150)
Anion Gap: 8 mEq/L (ref 3–11)
BILIRUBIN TOTAL: 0.33 mg/dL (ref 0.20–1.20)
BUN: 11.9 mg/dL (ref 7.0–26.0)
CO2: 23 meq/L (ref 22–29)
CREATININE: 1.1 mg/dL (ref 0.7–1.3)
Calcium: 9.8 mg/dL (ref 8.4–10.4)
Chloride: 107 mEq/L (ref 98–109)
EGFR: 72 mL/min/{1.73_m2} — ABNORMAL LOW (ref >90)
GLUCOSE: 204 mg/dL — AB (ref 70–140)
Potassium: 3.8 mEq/L (ref 3.5–5.1)
SODIUM: 138 meq/L (ref 136–145)
TOTAL PROTEIN: 6.7 g/dL (ref 6.4–8.3)

## 2016-10-13 LAB — CBC WITH DIFFERENTIAL/PLATELET
BASO%: 0.4 % (ref 0.0–2.0)
Basophils Absolute: 0 10*3/uL (ref 0.0–0.1)
EOS%: 0.7 % (ref 0.0–7.0)
Eosinophils Absolute: 0 10*3/uL (ref 0.0–0.5)
HCT: 29.1 % — ABNORMAL LOW (ref 38.4–49.9)
HGB: 10.1 g/dL — ABNORMAL LOW (ref 13.0–17.1)
LYMPH%: 15.4 % (ref 14.0–49.0)
MCH: 32.1 pg (ref 27.2–33.4)
MCHC: 34.8 g/dL (ref 32.0–36.0)
MCV: 92.3 fL (ref 79.3–98.0)
MONO#: 0.3 10*3/uL (ref 0.1–0.9)
MONO%: 7 % (ref 0.0–14.0)
NEUT%: 76.5 % — ABNORMAL HIGH (ref 39.0–75.0)
NEUTROS ABS: 3.8 10*3/uL (ref 1.5–6.5)
Platelets: 221 10*3/uL (ref 140–400)
RBC: 3.15 10*6/uL — AB (ref 4.20–5.82)
RDW: 12.8 % (ref 11.0–14.6)
WBC: 4.9 10*3/uL (ref 4.0–10.3)
lymph#: 0.8 10*3/uL — ABNORMAL LOW (ref 0.9–3.3)

## 2016-10-13 MED ORDER — PALONOSETRON HCL INJECTION 0.25 MG/5ML
INTRAVENOUS | Status: AC
  Filled 2016-10-13: qty 5

## 2016-10-13 MED ORDER — PALONOSETRON HCL INJECTION 0.25 MG/5ML
0.2500 mg | Freq: Once | INTRAVENOUS | Status: AC
  Administered 2016-10-13: 0.25 mg via INTRAVENOUS

## 2016-10-13 MED ORDER — CARBOPLATIN CHEMO INJECTION 600 MG/60ML
618.0000 mg | Freq: Once | INTRAVENOUS | Status: AC
  Administered 2016-10-13: 620 mg via INTRAVENOUS
  Filled 2016-10-13: qty 62

## 2016-10-13 MED ORDER — DEXAMETHASONE SODIUM PHOSPHATE 10 MG/ML IJ SOLN
10.0000 mg | Freq: Once | INTRAMUSCULAR | Status: AC
  Administered 2016-10-13: 10 mg via INTRAVENOUS

## 2016-10-13 MED ORDER — SODIUM CHLORIDE 0.9% FLUSH
10.0000 mL | INTRAVENOUS | Status: DC | PRN
  Administered 2016-10-13: 10 mL
  Filled 2016-10-13: qty 10

## 2016-10-13 MED ORDER — DEXAMETHASONE SODIUM PHOSPHATE 10 MG/ML IJ SOLN
INTRAMUSCULAR | Status: AC
Start: 2016-10-13 — End: 2016-10-13
  Filled 2016-10-13: qty 1

## 2016-10-13 MED ORDER — ETOPOSIDE CHEMO INJECTION 1 GM/50ML
100.0000 mg/m2 | Freq: Once | INTRAVENOUS | Status: AC
  Administered 2016-10-13: 230 mg via INTRAVENOUS
  Filled 2016-10-13: qty 11.5

## 2016-10-13 MED ORDER — SODIUM CHLORIDE 0.9 % IV SOLN
Freq: Once | INTRAVENOUS | Status: AC
  Administered 2016-10-13: 14:00:00 via INTRAVENOUS

## 2016-10-13 MED ORDER — HEPARIN SOD (PORK) LOCK FLUSH 100 UNIT/ML IV SOLN
500.0000 [IU] | Freq: Once | INTRAVENOUS | Status: AC | PRN
  Administered 2016-10-13: 500 [IU]
  Filled 2016-10-13: qty 5

## 2016-10-13 MED ORDER — SODIUM CHLORIDE 0.9 % IJ SOLN
10.0000 mL | Freq: Once | INTRAMUSCULAR | Status: AC
  Administered 2016-10-13: 10 mL
  Filled 2016-10-13: qty 10

## 2016-10-13 NOTE — Progress Notes (Signed)
Oncology Nurse Navigator Documentation  Oncology Nurse Navigator Flowsheets 10/13/2016  Navigator Location [facility]  Navigator Encounter Type Clinic/MDC/I spoke with Mr and Ms. Harlow Mares today.  Mr. Gauger is doing well with treatment and has little side effects. He will be receiving cycle 2 today.  No barriers identified at this time.   Patient Visit Type MedOnc  Treatment Phase Treatment  Barriers/Navigation Needs Education  Education Other  Interventions Education  Education Method Verbal  Acuity Level 1  Acuity Level 1 Minimal follow up required  Time Spent with Patient 15

## 2016-10-13 NOTE — Progress Notes (Signed)
. No images are attached to the encounter. No scans are attached to the encounter. No scans are attached to the encounter. [facility] SHARED VISIT PROGRESS NOTE  Thora Lance, MD [address] [place] [address]  DIAGNOSIS: Highly suspicious extensive stage (T4, N2, M1b) small cell lung cancer.  PRIOR THERAPY: None  CURRENT THERAPY: Systemic chemotherapy with carboplatin for an AUC of 5 on day 1 and etoposide 100 mg meter squared on days 1, 2, and 3 with Neulasta support. He is receiving concurrent radiation. His chemotherapy was started on 09/24/2016. He is status post 1 cycle.  INTERVAL HISTORY: Jesse Avila 66 y.o. male returns for regular follow-up of his small cell lung cancer. The patient tolerated his first cycle of chemotherapy well. He reported mild nausea that was helped with Compazine. He did not have any vomiting. He denies chest pain and shortness of breath. He reports a dry cough. Denies hemoptysis. Reports mild constipation is controlled with stool softeners. A tight is improving. He has lost about 3 pounds in the past 3 weeks. The patient is here for evaluation prior to cycle 2 of his chemotherapy.  MEDICAL HISTORY: Past Medical History:  Diagnosis Date  . Carotid artery occlusion   . DJD (degenerative joint disease) of knee   . Essential hypertension, benign    takes Benicar daily  . Goals of care, counseling/discussion 09/18/2016  . History of colon polyps    benign  . History of gout   . Hyperlipidemia    takes Fenofibrate and Crestor daily  . Insomnia    takes Ambien nightly as needed  . Joint pain   . Nocturia   . Peripheral vascular disease (HCC)   . Primary localized osteoarthritis of left knee 12/26/2014  . Stroke [facility]) 2015?   takes Plavix daily as well as Pletal,not on plavix at present 05/15/15  . 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  MEDICATIONS:  Current Outpatient Prescriptions  Medication Sig Dispense Refill  . aspirin EC 81 MG tablet Take 81 mg by mouth daily.    . Blood Glucose Monitoring Suppl (ACCU-CHEK NANO SMARTVIEW) w/Device KIT by Does not apply route.    . cilostazol (PLETAL) 100 MG tablet Take 100 mg by mouth 2 (two) times daily.     Marland Kitchen emollient (BIAFINE) cream Apply topically 2 (two) times daily.    . fenofibrate 160 MG tablet Take 160 mg by mouth daily.    Marland Kitchen glimepiride (AMARYL) 4 MG tablet Take 4 mg by mouth daily with breakfast.    . HYDROcodone-acetaminophen (NORCO/VICODIN) 5-325 MG tablet Take 1 tablet by mouth every 4 (four) hours as needed for moderate pain. 12 tablet 0  . lidocaine-prilocaine (EMLA) cream Apply 1 application topically as needed. 30 g 0  . metFORMIN (GLUCOPHAGE) 500 MG tablet Take 1 tablet (500 mg total) by mouth 3 (three) times daily.    . metoprolol succinate (TOPROL-XL) 50 MG 24 hr tablet Take 50 mg by mouth daily. Take with or immediately following a meal.    . pioglitazone (ACTOS) 15 MG tablet Take 15 mg by mouth daily.    . prochlorperazine (COMPAZINE) 10 MG tablet Take 1 tablet (10 mg total) by mouth every 6 (six) hours as needed for nausea or vomiting. 30 tablet 0  . rosuvastatin (CRESTOR) 40  MG tablet Take 20 mg by mouth daily. For cholesterol    . zolpidem (AMBIEN) 10 MG tablet Take 10 mg by mouth at bedtime as needed.      No current facility-administered medications for this visit.    Facility-Administered Medications Ordered in Other Visits  Medication Dose Route Frequency Provider Last Rate Last Dose  . 0.9 %  sodium chloride infusion   Intravenous Once Curt Bears, MD      . CARBOplatin (PARAPLATIN) 620 mg in sodium chloride 0.9 % 250 mL chemo infusion  620 mg Intravenous Once Curt Bears, MD      . dexamethasone (DECADRON) injection 10 mg  10 mg Intravenous Once Curt Bears, MD      . etoposide (VEPESID) 230 mg in sodium chloride 0.9 % 1,000 mL chemo infusion  100 mg/m2 (Treatment Plan Recorded) Intravenous Once Curt Bears, MD      . heparin lock flush 100 unit/mL  500 Units Intracatheter Once PRN Curt Bears, MD      . palonosetron (ALOXI) injection 0.25 mg  0.25 mg Intravenous Once Curt Bears, MD      . sodium chloride flush (NS) 0.9 % injection 10 mL  10 mL Intracatheter PRN Curt Bears, MD        SURGICAL HISTORY:  Past Surgical History:  Procedure Laterality Date  . ABDOMINAL AORTAGRAM N/A 03/29/2012   Procedure: ABDOMINAL Maxcine Ham;  Surgeon: Angelia Mould, MD;  Location: [facility] CATH LAB;  Service: Cardiovascular;  Laterality: N/A;  . COLONOSCOPY    . ENDARTERECTOMY Right 05/09/2014   Procedure: RIGHT CAROTID ENDARTERECTOMY WITH PATCH ANGIOPLASTY;  Surgeon: Conrad Tremont, MD;  Location: [place];  Service: Vascular;  Laterality: Right;  . ENDARTERECTOMY FEMORAL Right 05/17/2015   Procedure: ENDARTERECTOMY FEMORAL;  Surgeon: Angelia Mould, MD;  Location: [place];  Service: Vascular;  Laterality: Right;  . ENDOBRONCHIAL ULTRASOUND Bilateral 09/08/2016   Procedure: ENDOBRONCHIAL ULTRASOUND;  Surgeon: Rigoberto Noel, MD;  Location: [facility] ENDOSCOPY;  Service: Cardiopulmonary;  Laterality: Bilateral;  . FEMORAL-POPLITEAL BYPASS GRAFT Right 05/17/2015   Procedure: BYPASS GRAFT FEMORAL-BELOW KNEE POPLITEAL ARTERY, using gortex propaten graft 6 mm x 80 cm;  Surgeon: Angelia Mould, MD;  Location: [place];  Service: Vascular;  Laterality: Right;  . IR GENERIC HISTORICAL  09/25/2016   IR FLUORO GUIDE PORT INSERTION RIGHT 09/25/2016 Markus Daft, MD [facility]-INTERV RAD  . IR GENERIC HISTORICAL  09/25/2016   IR US GUIDE VASC ACCESS RIGHT 09/25/2016 Markus Daft, MD [facility]-INTERV RAD  . KNEE ARTHROSCOPY Right 11/2009  . LOWER EXTREMITY ANGIOGRAM Bilateral 03/23/2015   Procedure: Lower Extremity Angiogram;  Surgeon: Angelia Mould, MD;  Location: [place] CV LAB;  Service: Cardiovascular;  Laterality: Bilateral;  . MENISCUS REPAIR  11/2009  . PARTIAL KNEE ARTHROPLASTY Left 12/26/2014   Procedure: UNICOMPARTMENTAL KNEE;  Surgeon: Marchia Bond, MD;  Location: [place];  Service: Orthopedics;  Laterality: Left;  . PATCH ANGIOPLASTY Right 05/17/2015   Procedure: VEIN PATCH ANGIOPLASTY ILEOFEMORAL ARTERY;  Surgeon: Angelia Mould, MD;  Location: [place];  Service: Vascular;  Laterality: Right;  . PERCUTANEOUS STENT INTERVENTION N/A 05/10/2012   Procedure: PERCUTANEOUS STENT INTERVENTION;  Surgeon: Angelia Mould, MD;  Location[facility] C[facility] LAB;  Service: Cardiovascular;  Laterality: N/A;  . PERIPHERAL VASCULAR CATHETERIZATION N/A 03/23/2015   Procedure: Abdominal Aortogram;  Surgeon: Angelia Mould, MD;  Location: [place] CV LAB;  Service: Cardiovascular;  Laterality: N/A;  . STENTS     PLACED IN ??  BOTH LEGS   2013?    REVIEW OF SYSTEMS:  Review of Systems  Constitutional: Positive for malaise/fatigue and weight loss. Negative for fever.  HENT: Negative.   Eyes: Negative.   Respiratory: Positive for cough. Negative for hemoptysis, sputum production, shortness of breath and wheezing.   Cardiovascular: Negative.   Gastrointestinal: Positive for constipation and nausea. Negative for abdominal pain, blood in stool, diarrhea and vomiting.  Genitourinary: Negative.   Musculoskeletal: Negative.   Skin: Negative.   Neurological: Negative.   Endo/Heme/Allergies: Negative.   Psychiatric/Behavioral: Negative.      PHYSICAL EXAMINATION: Physical Exam  Constitutional: He is oriented to person, place, and time and well-developed, well-nourished, and in no distress. No distress.  HENT:  Head: Normocephalic and atraumatic.  Mouth/Throat: Oropharynx is clear and moist. No oropharyngeal exudate.  Eyes: Conjunctivae and EOM are normal. Pupils are equal, round, and reactive to light. No scleral icterus.  Neck: Normal range of motion.  Neck supple. No tracheal deviation present. No thyromegaly present.  Cardiovascular: Normal rate, regular rhythm and normal heart sounds.   Pulmonary/Chest: Effort normal and breath sounds normal. He has no wheezes. He has no rales.  Abdominal: Soft. Bowel sounds are normal. He exhibits no distension. There is no tenderness.  Musculoskeletal: Normal range of motion. He exhibits no edema.  Lymphadenopathy:    He has no cervical adenopathy.  Neurological: He is alert and oriented to person, place, and time. Gait normal.  Skin: Skin is warm and dry. No rash noted. He is not diaphoretic. No erythema. No pallor.  Psychiatric: Mood, memory, affect and judgment normal.  Vitals reviewed.   ECOG PERFORMANCE STATUS: 1 - Symptomatic but completely ambulatory  Blood pressure 116/63, pulse 80, temperature 98.2 F (36.8 C), temperature source Oral, resp. rate 18, weight 220 lb 11.2 oz (100.1 kg), SpO2 97 %.  LABORATORY DATA: Lab Results  Component Value Date   WBC 4.9 10/13/2016   HGB 10.1 (L) 10/13/2016   HCT 29.1 (L) 10/13/2016   MCV 92.3 10/13/2016   PLT 221 10/13/2016      Chemistry      Component Value Date/Time   NA 138 10/13/2016 1013   K 3.8 10/13/2016 1013   CL 104 09/25/2016 0759   CO2 23 10/13/2016 1013   BUN 11.9 10/13/2016 1013   CREATININE 1.1 10/13/2016 1013      Component Value Date/Time   CALCIUM 9.8 10/13/2016 1013   ALKPHOS 68 10/13/2016 1013   AST 14 10/13/2016 1013   ALT 17 10/13/2016 1013   BILITOT 0.33 10/13/2016 1013       RADIOGRAPHIC STUDIES:  Dg Chest 2 View  Result Date: 09/21/2016 CLINICAL DATA:  Patient with history of lung cancer. Posterior chest and scapular pain. EXAM: CHEST  2 VIEW COMPARISON:  Chest radiograph 09/08/2016. FINDINGS: Stable cardiac and mediastinal contours. Persistent large left suprahilar mass. Unchanged bandlike consolidation within the left mid lung. Pleural thickening overlying the left lung apex. No pneumothorax. Thoracic  spine degenerative changes. IMPRESSION: Probable small amount of pleural fluid left upper hemithorax. Re- demonstrated large left suprahilar mass. Electronically Signed   By: Lovey Newcomer M.D.   On: 09/21/2016 15:40   Ct Angio Chest Pe W And/or Wo Contrast  Result Date: 09/21/2016 CLINICAL DATA:  Lung cancer. Scapular pain and pain with deep breathing. EXAM: CT ANGIOGRAPHY CHEST WITH CONTRAST TECHNIQUE: Multidetector CT imaging of the chest was performed using the standard protocol during bolus administration of intravenous contrast. Multiplanar CT image reconstructions and  MIPs were obtained to evaluate the vascular anatomy. CONTRAST:  100 cc Isovue 370 COMPARISON:  09/02/2016 FINDINGS: Cardiovascular: Heart is enlarged. Coronary artery calcification is noted. Atherosclerotic calcification is noted in the wall of the thoracic aorta. As seen on prior study, tumor appears to involve the lateral wall the transverse aortic arch. Left upper lobe and lingular pulmonary arteries are markedly attenuated by mass-effect from the tumor centered in the region of the left hilum. There is also marked mass-effect on the left lower lobe pulmonary artery. No evidence for pulmonary embolus. Mediastinum/Nodes: Prevascular lymph nodes again noted. No right hilar lymphadenopathy. Esophagus unremarkable. There is no axillary lymphadenopathy. Lungs/Pleura: Soft tissue lesion described previously in the AP window was measured on soft tissue windows. Measuring the lesion again on soft tissue windows today, is progressed in the interval measuring 7.5 x 8.9 cm compared to 6.6 x 8.3 cm previously. Interlobular septal thickening is seen in the left upper lobe with satellite nodularity, features suspicious for lymphangitic tumor spread. There is bibasilar compressive atelectasis with small left pleural effusion. Calcified pleural plaques bilaterally suggests prior asbestos exposure. Upper Abdomen: Liver is enlarged and visualized portion  shows diffuse fatty deposition. Musculoskeletal: Bone windows reveal no worrisome lytic or sclerotic osseous lesions. Review of the MIP images confirms the above findings. IMPRESSION: 1. No CT evidence for acute pulmonary embolus. 2. Central left lung lesion markedly attenuates pulmonary arteries to the left upper lobe, lingula, left lower lobe. 3. Interval progression of the large central left upper lobe mass lesion. 4. Hepatic steatosis. 5. Coronary artery and thoracic aortic atherosclerosis. Electronically Signed   By: Misty Stanley M.D.   On: 09/21/2016 19:27   Mr Jeri Cos QV Contrast  Result Date: 09/26/2016 CLINICAL DATA:  Recent diagnosis small cell lung cancer.  Staging EXAM: MRI HEAD WITHOUT AND WITH CONTRAST TECHNIQUE: Multiplanar, multiecho pulse sequences of the brain and surrounding structures were obtained without and with intravenous contrast. CONTRAST:  21m MULTIHANCE GADOBENATE DIMEGLUMINE 529 MG/ML IV SOLN COMPARISON:  04/15/2014 FINDINGS: Brain: Diffusion imaging does not show any acute or subacute infarction. There is an old small vessel infarction in the right cerebellum. There is old cortical and subcortical infarction in the deep insula and frontoparietal region on the right. There are mild chronic small-vessel ischemic changes of the cerebral hemispheric white matter. Old small lacunar infarction right caudate. No evidence of mass lesion, hemorrhage, hydrocephalus or extra-axial collection. Vascular: Major vessels at the base of the brain show flow. Skull and upper cervical spine: Negative Sinuses/Orbits: Clear/normal Other: None significant IMPRESSION: No evidence of metastatic disease. 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 parietal infarction. Electronically Signed   By: MNelson ChimesM.D.   On: 09/26/2016 09:58   Nm Pet Image Initial (pi) Skull Base To Thigh  Result Date: 09/17/2016 CLINICAL DATA:  Initial treatment strategy for left upper lobe lung  mass. EXAM: NUCLEAR MEDICINE PET SKULL BASE TO THIGH TECHNIQUE: 13.2 mCi F-18 FDG was injected intravenously. Full-ring PET imaging was performed from the skull base to thigh after the radiotracer. CT data was obtained and used for attenuation correction and anatomic localization. FASTING BLOOD GLUCOSE:  Value: 145 mg/dl COMPARISON:  Multiple exams, including 09/02/2016 FINDINGS: NECK No hypermetabolic lymph nodes in the neck. Carotid atherosclerosis, left greater than right. CHEST Left suprahilar mass invading the mediastinum, AP window, and encasing the left pulmonary artery, left upper lobe bronchus, and wraparound parts of the left lower lobe bronchus and left mainstem bronchus as  shown previously, maximum SUV 15.2. Anterior lobulation could represent a separate lymph node or simply extension of the mass in the middle mediastinum. Postobstructive atelectasis and pneumonitis in the left upper lobe. No separate metastatic focus separated from the main mass is identified in the chest. Coronary, aortic arch, and branch vessel atherosclerotic vascular disease. Mild cardiomegaly. Bilateral calcified pleural plaques. ABDOMEN/PELVIS Abnormal 1.9 cm focus of hypermetabolic activity in the uncinate process of the pancreas, maximum SUV 9.6, favoring malignancy. Scattered physiologic activity in the bowel, more notably in the colon. Circumferential activity at the anorectal junction is likely physiologic. Aortoiliac atherosclerotic vascular disease. Sigmoid colon diverticulosis diffuse hepatic steatosis. Diffuse hepatic steatosis. SKELETON In the left iliac bone there is an ill-defined focus of metabolic activity with maximum SUV 5.3 without CT correlate. Contralateral activity in this vicinity in the normal iliac bone maximum SUV 2.5. Several other speckled areas of faint metabolic activity in the lumbar spine are likely incidental. IMPRESSION: 1. The large left suprahilar mass invading the mediastinum has a maximum SUV of  15.2, appearance compatible with histologic diagnosis of small cell carcinoma. No hypermetabolic adenopathy in the chest. 2. There is an approximately 1.9 cm mass of the uncinate process of the pancreas which is hypermetabolic. This could represent a metastatic focus or a second primary cancer. 3. Ill-defined asymmetric focus of mildly increased metabolic activity in the left iliac bone near the SI joint, maximum SUV 5.3, no CT correlate. Early osseous metastatic disease cannot be readily excluded, surveillance likely warranted. 4. Other imaging findings of potential clinical significance: Coronary, aortic arch, and branch vessel atherosclerotic vascular disease. Left greater than right carotid atherosclerosis. Aortoiliac atherosclerotic vascular disease. Mild cardiomegaly. Bilateral calcified pleural plaques suggesting remote asbestos exposure. Sigmoid colon diverticulosis. Diffuse hepatic steatosis. Electronically Signed   By: Van Clines M.D.   On: 09/17/2016 12:58   Ir US Guide Vasc Access Right  Addendum Date: 09/25/2016   ADDENDUM REPORT: 09/25/2016 15:53 ADDENDUM: The port was left accessed at the end of the procedure. The port was flushed with saline, not heparin. Electronically Signed   By: Markus Daft M.D.   On: 09/25/2016 15:53   Result Date: 09/25/2016 INDICATION: 66 year old with left lung cancer. Port-A-Cath needed for chemotherapy. EXAM: FLUOROSCOPIC AND ULTRASOUND GUIDED PLACEMENT OF A SUBCUTANEOUS PORT COMPARISON:  None. MEDICATIONS: Ancef 2 g; The antibiotic was administered within an appropriate time interval prior to skin puncture. ANESTHESIA/SEDATION: Versed 4.0 mg IV; Fentanyl 150 mcg IV; Moderate Sedation Time:  33 minutes The patient was continuously monitored during the procedure by the interventional radiology nurse under my direct supervision. FLUOROSCOPY TIME:  54 seconds, 16 mGy COMPLICATIONS: None immediate. PROCEDURE: The procedure, risks, benefits, and alternatives were  explained to the patient. Questions regarding the procedure were encouraged and answered. The patient understands and consents to the procedure. Patient was placed supine on the interventional table. Ultrasound confirmed a patent right internal jugular vein. The right chest and neck were cleaned with a skin antiseptic and a sterile drape was placed. Maximal barrier sterile technique was utilized including caps, mask, sterile gowns, sterile gloves, sterile drape, hand hygiene and skin antiseptic. The right neck was anesthetized with 1% lidocaine. Small incision was made in the right neck with a blade. Micropuncture set was placed in the right internal jugular vein with ultrasound guidance. The micropuncture wire was used for measurement purposes. The right chest was anesthetized with 1% lidocaine with epinephrine. #15 blade was used to make an incision and a subcutaneous port pocket  was formed. Sawyer was assembled. Subcutaneous tunnel was formed with a stiff tunneling device. The port catheter was brought through the subcutaneous tunnel. The port was placed in the subcutaneous pocket. The micropuncture set was exchanged for a peel-away sheath. The catheter was placed through the peel-away sheath and the tip was positioned at the superior cavoatrial junction. Catheter placement was confirmed with fluoroscopy. The port was accessed and flushed with heparinized saline. The port pocket was closed using two layers of absorbable sutures and Dermabond. The vein skin site was closed using a single layer of absorbable suture and Dermabond. Sterile dressings were applied. Patient tolerated the procedure well without an immediate complication. Ultrasound and fluoroscopic images were taken and saved for this procedure. IMPRESSION: Placement of a subcutaneous port device. Catheter tip at the superior cavoatrial junction and ready to be used. Electronically Signed: By: Markus Daft M.D. On: 09/25/2016 10:24   Ir  Fluoro Guide Port Insertion Right  Addendum Date: 09/25/2016   ADDENDUM REPORT: 09/25/2016 15:53 ADDENDUM: The port was left accessed at the end of the procedure. The port was flushed with saline, not heparin. Electronically Signed   By: Markus Daft M.D.   On: 09/25/2016 15:53   Result Date: 09/25/2016 INDICATION: 66 year old with left lung cancer. Port-A-Cath needed for chemotherapy. EXAM: FLUOROSCOPIC AND ULTRASOUND GUIDED PLACEMENT OF A SUBCUTANEOUS PORT COMPARISON:  None. MEDICATIONS: Ancef 2 g; The antibiotic was administered within an appropriate time interval prior to skin puncture. ANESTHESIA/SEDATION: Versed 4.0 mg IV; Fentanyl 150 mcg IV; Moderate Sedation Time:  33 minutes The patient was continuously monitored during the procedure by the interventional radiology nurse under my direct supervision. FLUOROSCOPY TIME:  54 seconds, 16 mGy COMPLICATIONS: None immediate. PROCEDURE: The procedure, risks, benefits, and alternatives were explained to the patient. Questions regarding the procedure were encouraged and answered. The patient understands and consents to the procedure. Patient was placed supine on the interventional table. Ultrasound confirmed a patent right internal jugular vein. The right chest and neck were cleaned with a skin antiseptic and a sterile drape was placed. Maximal barrier sterile technique was utilized including caps, mask, sterile gowns, sterile gloves, sterile drape, hand hygiene and skin antiseptic. The right neck was anesthetized with 1% lidocaine. Small incision was made in the right neck with a blade. Micropuncture set was placed in the right internal jugular vein with ultrasound guidance. The micropuncture wire was used for measurement purposes. The right chest was anesthetized with 1% lidocaine with epinephrine. #15 blade was used to make an incision and a subcutaneous port pocket was formed. Kevil was assembled. Subcutaneous tunnel was formed with a stiff  tunneling device. The port catheter was brought through the subcutaneous tunnel. The port was placed in the subcutaneous pocket. The micropuncture set was exchanged for a peel-away sheath. The catheter was placed through the peel-away sheath and the tip was positioned at the superior cavoatrial junction. Catheter placement was confirmed with fluoroscopy. The port was accessed and flushed with heparinized saline. The port pocket was closed using two layers of absorbable sutures and Dermabond. The vein skin site was closed using a single layer of absorbable suture and Dermabond. Sterile dressings were applied. Patient tolerated the procedure well without an immediate complication. Ultrasound and fluoroscopic images were taken and saved for this procedure. IMPRESSION: Placement of a subcutaneous port device. Catheter tip at the superior cavoatrial junction and ready to be used. Electronically Signed: By: Markus Daft M.D. On: 09/25/2016 10:24  ASSESSMENT/PLAN:  No problem-specific Assessment & Plan notes found for this encounter.  This is a 66 year old white male with highly suspicious extensive stage (T4, N2, M1b) small cell lung cancer who presented with a large left upper lobe lung mass with mediastinal invasion as well as a questionable pancreatic lesion and suspicious left iliac bone metastasis diagnosed in February 2018. The patient is currently receiving systemic chemotherapy with carboplatin for an AUC of 5 on day 1 and etoposide 100 mg meter squared on days 1, 2, and 3 with Neulasta support. He is also receiving current radiation. MRI of the brain was reviewed with the patient and his wife. Patient tolerated his first cycle chemotherapy well and will proceed with cycle 2 of his chemotherapy as scheduled. The patient will have a restaging CT of the chest abdomen and pelvis prior to cycle #3 of his chemotherapy. He will continue radiation under the care of Dr. Sondra Come.  The patient will be seen back  in 3 weeks prior to cycle 3 of his chemotherapy. He will have weekly labs.  Patient was seen with Dr. Julien Nordmann.  All questions were answered. The patient knows to call the clinic with any problems, questions or concerns. We can certainly see the patient much sooner if necessary.  Mikey Bussing, DNP, AGPCNP-BC, AOCNP 10/13/16  ADDENDUM: Hematology/Oncology Attending: I had a face to face encounter with the patient today. I recommended his care plan. This is a very pleasant 66 years old white male with questionable extensive stage small cell lung cancer currently undergoing systemic chemotherapy with carboplatin and etoposide status post 1 cycle concurrent with radiation. He tolerated the first cycle of his treatment well with no significant adverse effects. I recommended for him to proceed with cycle #2 today as scheduled. He would come back for follow-up visit in 3 weeks for evaluation after repeating CT scan of the chest, abdomen and pelvis for restaging of his disease before starting cycle #3. The patient was advised to call immediately if he has any concerning symptoms in the interval.  Disclaimer: This note was dictated with voice recognition software. Similar sounding words can inadvertently be transcribed and may be missed upon review. Eilleen Kempf., MD 10/13/16

## 2016-10-13 NOTE — Telephone Encounter (Signed)
Appointments scheduled per 3/12 LOS. Patient given AVS report and calendars with future scheduled appointments. Patient stated that he has the injected contrast for his CT scans.

## 2016-10-13 NOTE — Patient Instructions (Signed)
[facility] Discharge Instructions for Patients Receiving Chemotherapy  Today you received the following chemotherapy agents:  Carboplatin (paraplatin), Etoposide (VP-16)  To help prevent nausea and vomiting after your treatment, we encourage you to take your nausea medication as prescribed.   If you develop nausea and vomiting that is not controlled by your nausea medication, call the clinic.   BELOW ARE SYMPTOMS THAT SHOULD BE REPORTED IMMEDIATELY:  *FEVER GREATER THAN 100.5 F  *CHILLS WITH OR WITHOUT FEVER  NAUSEA AND VOMITING THAT IS NOT CONTROLLED WITH YOUR NAUSEA MEDICATION  *UNUSUAL SHORTNESS OF BREATH  *UNUSUAL BRUISING OR BLEEDING  TENDERNESS IN MOUTH AND THROAT WITH OR WITHOUT PRESENCE OF ULCERS  *URINARY PROBLEMS  *BOWEL PROBLEMS  UNUSUAL RASH Items with * indicate a potential emergency and should be followed up as soon as possible.  Feel free to call the clinic you have any questions or concerns. The clinic phone number is [phone].  Please show the [place] at check-in to the Emergency Department and triage nurse.

## 2016-10-13 NOTE — Patient Instructions (Signed)
Implanted Port Home Guide An implanted port is a type of central line that is placed under the skin. Central lines are used to provide IV access when treatment or nutrition needs to be given through a person's veins. Implanted ports are used for long-term IV access. An implanted port may be placed because:  You need IV medicine that would be irritating to the small veins in your hands or arms.  You need long-term IV medicines, such as antibiotics.  You need IV nutrition for a long period.  You need frequent blood draws for lab tests.  You need dialysis.  Implanted ports are usually placed in the chest area, but they can also be placed in the upper arm, the abdomen, or the leg. An implanted port has two main parts:  Reservoir. The reservoir is round and will appear as a small, raised area under your skin. The reservoir is the part where a needle is inserted to give medicines or draw blood.  Catheter. The catheter is a thin, flexible tube that extends from the reservoir. The catheter is placed into a large vein. Medicine that is inserted into the reservoir goes into the catheter and then into the vein.  How will I care for my incision site? Do not get the incision site wet. Bathe or shower as directed by your health care provider. How is my port accessed? Special steps must be taken to access the port:  Before the port is accessed, a numbing cream can be placed on the skin. This helps numb the skin over the port site.  Your health care provider uses a sterile technique to access the port. ? Your health care provider must put on a mask and sterile gloves. ? The skin over your port is cleaned carefully with an antiseptic and allowed to dry. ? The port is gently pinched between sterile gloves, and a needle is inserted into the port.  Only "non-coring" port needles should be used to access the port. Once the port is accessed, a blood return should be checked. This helps ensure that the port  is in the vein and is not clogged.  If your port needs to remain accessed for a constant infusion, a clear (transparent) bandage will be placed over the needle site. The bandage and needle will need to be changed every week, or as directed by your health care provider.  Keep the bandage covering the needle clean and dry. Do not get it wet. Follow your health care provider's instructions on how to take a shower or bath while the port is accessed.  If your port does not need to stay accessed, no bandage is needed over the port.  What is flushing? Flushing helps keep the port from getting clogged. Follow your health care provider's instructions on how and when to flush the port. Ports are usually flushed with saline solution or a medicine called heparin. The need for flushing will depend on how the port is used.  If the port is used for intermittent medicines or blood draws, the port will need to be flushed: ? After medicines have been given. ? After blood has been drawn. ? As part of routine maintenance.  If a constant infusion is running, the port may not need to be flushed.  How long will my port stay implanted? The port can stay in for as long as your health care provider thinks it is needed. When it is time for the port to come out, surgery will be   done to remove it. The procedure is similar to the one performed when the port was put in. When should I seek immediate medical care? When you have an implanted port, you should seek immediate medical care if:  You notice a bad smell coming from the incision site.  You have swelling, redness, or drainage at the incision site.  You have more swelling or pain at the port site or the surrounding area.  You have a fever that is not controlled with medicine.  This information is not intended to replace advice given to you by your health care provider. Make sure you discuss any questions you have with your health care provider. Document  Released: 07/21/2005 Document Revised: 12/27/2015 Document Reviewed: 03/28/2013 Elsevier Interactive Patient Education  2017 Elsevier Inc.  

## 2016-10-14 ENCOUNTER — Ambulatory Visit
Admission: RE | Admit: 2016-10-14 | Discharge: 2016-10-14 | Disposition: A | Discharge status: Home / Self Care | Payer: BLUE CROSS/BLUE SHIELD | Source: Ambulatory Visit | Attending: Radiation Oncology | Admitting: Radiation Oncology

## 2016-10-14 ENCOUNTER — Ambulatory Visit: Payer: BLUE CROSS/BLUE SHIELD | Admitting: Internal Medicine

## 2016-10-14 ENCOUNTER — Ambulatory Visit (HOSPITAL_BASED_OUTPATIENT_CLINIC_OR_DEPARTMENT_OTHER): Payer: BLUE CROSS/BLUE SHIELD

## 2016-10-14 VITALS — BP 106/72 | HR 100 | Temp 97.7°F | Resp 18

## 2016-10-14 VITALS — BP 142/65 | HR 100 | Temp 97.7°F | Resp 18 | Wt 220.6 lb

## 2016-10-14 DIAGNOSIS — C3412 Malignant neoplasm of upper lobe, left bronchus or lung: Secondary | ICD-10-CM | POA: Diagnosis not present

## 2016-10-14 DIAGNOSIS — C3492 Malignant neoplasm of unspecified part of left bronchus or lung: Secondary | ICD-10-CM

## 2016-10-14 DIAGNOSIS — Z510 Encounter for antineoplastic radiation therapy: Secondary | ICD-10-CM | POA: Diagnosis not present

## 2016-10-14 DIAGNOSIS — Z5111 Encounter for antineoplastic chemotherapy: Secondary | ICD-10-CM | POA: Diagnosis not present

## 2016-10-14 MED ORDER — SODIUM CHLORIDE 0.9 % IV SOLN
Freq: Once | INTRAVENOUS | Status: AC
  Administered 2016-10-14: 12:00:00 via INTRAVENOUS

## 2016-10-14 MED ORDER — HEPARIN SOD (PORK) LOCK FLUSH 100 UNIT/ML IV SOLN
500.0000 [IU] | Freq: Once | INTRAVENOUS | Status: AC | PRN
  Administered 2016-10-14: 500 [IU]
  Filled 2016-10-14: qty 5

## 2016-10-14 MED ORDER — DEXAMETHASONE SODIUM PHOSPHATE 10 MG/ML IJ SOLN
10.0000 mg | Freq: Once | INTRAMUSCULAR | Status: AC
  Administered 2016-10-14: 10 mg via INTRAVENOUS

## 2016-10-14 MED ORDER — ETOPOSIDE CHEMO INJECTION 1 GM/50ML
100.0000 mg/m2 | Freq: Once | INTRAVENOUS | Status: AC
  Administered 2016-10-14: 230 mg via INTRAVENOUS
  Filled 2016-10-14: qty 11.5

## 2016-10-14 MED ORDER — DEXAMETHASONE SODIUM PHOSPHATE 10 MG/ML IJ SOLN
INTRAMUSCULAR | Status: AC
  Filled 2016-10-14: qty 1

## 2016-10-14 MED ORDER — SODIUM CHLORIDE 0.9 % IV SOLN
100.0000 mg/m2 | Freq: Once | INTRAVENOUS | Status: DC
  Filled 2016-10-14: qty 11.5

## 2016-10-14 MED ORDER — SODIUM CHLORIDE 0.9% FLUSH
10.0000 mL | INTRAVENOUS | Status: DC | PRN
  Administered 2016-10-14: 10 mL
  Filled 2016-10-14: qty 10

## 2016-10-14 NOTE — Progress Notes (Signed)
  Radiation Oncology         [phone] ________________________________  Name: Jesse Avila MRN: [id number]  Date: 10/14/2016  DOB: 05-10-1951  Weekly Radiation Therapy Management    ICD-9-CM ICD-10-CM   1. Small cell lung carcinoma, left (HCC) 162.9 C34.92      Current Dose: 22.0 Gy     Planned Dose:  60 Gy  Narrative . . . . . . . . The patient presents for routine under treatment assessment.                                   Jesse Avila has completed 6 fractions to his left lung. He denies pain or fatigue. The patient reports a nonproductive dry cough. He denies SOB. He denies difficulty swallowing. The patient reports a good appetite.                                   Set-up films were reviewed.                                 The chart was checked. Physical Findings. . .  weight is 220 lb 9.6 oz (100.1 kg). His oral temperature is 97.7 F (36.5 C). His blood pressure is 142/65 (abnormal) and his pulse is 100. His respiration is 18 and oxygen saturation is 98%.   Some left-sided wheezing. Heart has regular rate and rhythm. Impression . . . . . . . The patient is tolerating radiation. The patient's tumor appear to be responding well to his chemotherapy and radiation on treatment imaging. Plan . . . . . . . . . . . . Continue treatment as planned.  ________________________________   Jesse Avila  This document serves as a record of services personally performed by Gery Pray, Avila. It was created on his behalf by Maryla Morrow, a trained medical scribe. The creation of this record is based on the scribe's personal observations and the provider's statements to them. This document has been checked and approved by the attending provider.

## 2016-10-14 NOTE — Patient Instructions (Signed)
[facility] Discharge Instructions for Patients Receiving Chemotherapy  Today you received the following chemotherapy agents: Etoposide   To help prevent nausea and vomiting after your treatment, we encourage you to take your nausea medication as directed.    If you develop nausea and vomiting that is not controlled by your nausea medication, call the clinic.   BELOW ARE SYMPTOMS THAT SHOULD BE REPORTED IMMEDIATELY:  *FEVER GREATER THAN 100.5 F  *CHILLS WITH OR WITHOUT FEVER  NAUSEA AND VOMITING THAT IS NOT CONTROLLED WITH YOUR NAUSEA MEDICATION  *UNUSUAL SHORTNESS OF BREATH  *UNUSUAL BRUISING OR BLEEDING  TENDERNESS IN MOUTH AND THROAT WITH OR WITHOUT PRESENCE OF ULCERS  *URINARY PROBLEMS  *BOWEL PROBLEMS  UNUSUAL RASH Items with * indicate a potential emergency and should be followed up as soon as possible.  Feel free to call the clinic you have any questions or concerns. The clinic phone number is [phone].  Please show the CHEMO ALERT CARD at check-in to the Emergency Department and triage nurse.   

## 2016-10-14 NOTE — Progress Notes (Signed)
Jesse Avila completed 11th fraction to left lung.  He denies having any pain.  Denies having issues with energy.  Skin to chest and back has hyperpigmentation.  Nonproductive dry cough.  Denies any issues swallowing.  States his appetite is good.  Denies any shortness of breath.  Vitals:   10/14/16 1058  BP: (!) 142/65  Pulse: 100  Resp: 18  Temp: 97.7 F (36.5 C)  TempSrc: Oral  SpO2: 98%  Weight: 220 lb 9.6 oz (100.1 kg)    Wt Readings from Last 3 Encounters:  10/14/16 220 lb 9.6 oz (100.1 kg)  10/13/16 220 lb 11.2 oz (100.1 kg)  10/07/16 217 lb (98.4 kg)

## 2016-10-15 ENCOUNTER — Other Ambulatory Visit: Payer: BLUE CROSS/BLUE SHIELD

## 2016-10-15 ENCOUNTER — Ambulatory Visit: Payer: BLUE CROSS/BLUE SHIELD

## 2016-10-15 ENCOUNTER — Ambulatory Visit: Payer: BLUE CROSS/BLUE SHIELD | Admitting: Internal Medicine

## 2016-10-15 ENCOUNTER — Ambulatory Visit (HOSPITAL_BASED_OUTPATIENT_CLINIC_OR_DEPARTMENT_OTHER): Payer: BLUE CROSS/BLUE SHIELD

## 2016-10-15 ENCOUNTER — Ambulatory Visit
Admission: RE | Admit: 2016-10-15 | Discharge: 2016-10-15 | Disposition: A | Discharge status: Home / Self Care | Payer: BLUE CROSS/BLUE SHIELD | Source: Ambulatory Visit | Attending: Radiation Oncology | Admitting: Radiation Oncology

## 2016-10-15 VITALS — BP 109/59 | HR 82 | Temp 98.0°F | Resp 18

## 2016-10-15 DIAGNOSIS — C3412 Malignant neoplasm of upper lobe, left bronchus or lung: Secondary | ICD-10-CM | POA: Diagnosis not present

## 2016-10-15 DIAGNOSIS — Z5111 Encounter for antineoplastic chemotherapy: Secondary | ICD-10-CM | POA: Diagnosis not present

## 2016-10-15 DIAGNOSIS — C3492 Malignant neoplasm of unspecified part of left bronchus or lung: Secondary | ICD-10-CM

## 2016-10-15 DIAGNOSIS — Z510 Encounter for antineoplastic radiation therapy: Secondary | ICD-10-CM | POA: Diagnosis not present

## 2016-10-15 MED ORDER — DEXAMETHASONE SODIUM PHOSPHATE 10 MG/ML IJ SOLN
10.0000 mg | Freq: Once | INTRAMUSCULAR | Status: AC
  Administered 2016-10-15: 10 mg via INTRAVENOUS

## 2016-10-15 MED ORDER — SODIUM CHLORIDE 0.9% FLUSH
10.0000 mL | INTRAVENOUS | Status: DC | PRN
  Administered 2016-10-15: 10 mL
  Filled 2016-10-15: qty 10

## 2016-10-15 MED ORDER — SODIUM CHLORIDE 0.9 % IV SOLN
100.0000 mg/m2 | Freq: Once | INTRAVENOUS | Status: AC
  Administered 2016-10-15: 230 mg via INTRAVENOUS
  Filled 2016-10-15: qty 11.5

## 2016-10-15 MED ORDER — DEXAMETHASONE SODIUM PHOSPHATE 10 MG/ML IJ SOLN
INTRAMUSCULAR | Status: AC
  Filled 2016-10-15: qty 1

## 2016-10-15 MED ORDER — SODIUM CHLORIDE 0.9 % IV SOLN
Freq: Once | INTRAVENOUS | Status: AC
  Administered 2016-10-15: 13:00:00 via INTRAVENOUS

## 2016-10-15 MED ORDER — HEPARIN SOD (PORK) LOCK FLUSH 100 UNIT/ML IV SOLN
500.0000 [IU] | Freq: Once | INTRAVENOUS | Status: AC | PRN
  Administered 2016-10-15: 500 [IU]
  Filled 2016-10-15: qty 5

## 2016-10-15 NOTE — Patient Instructions (Signed)
[facility] Discharge Instructions for Patients Receiving Chemotherapy  Today you received the following chemotherapy agents Etoposide.   To help prevent nausea and vomiting after your treatment, we encourage you to take your nausea medication as prescribed.   If you develop nausea and vomiting that is not controlled by your nausea medication, call the clinic.   BELOW ARE SYMPTOMS THAT SHOULD BE REPORTED IMMEDIATELY:  *FEVER GREATER THAN 100.5 F  *CHILLS WITH OR WITHOUT FEVER  NAUSEA AND VOMITING THAT IS NOT CONTROLLED WITH YOUR NAUSEA MEDICATION  *UNUSUAL SHORTNESS OF BREATH  *UNUSUAL BRUISING OR BLEEDING  TENDERNESS IN MOUTH AND THROAT WITH OR WITHOUT PRESENCE OF ULCERS  *URINARY PROBLEMS  *BOWEL PROBLEMS  UNUSUAL RASH Items with * indicate a potential emergency and should be followed up as soon as possible.  Feel free to call the clinic you have any questions or concerns. The clinic phone number is [phone].  Please show the CHEMO ALERT CARD at check-in to the Emergency Department and triage nurse.   

## 2016-10-16 ENCOUNTER — Ambulatory Visit
Admission: RE | Admit: 2016-10-16 | Discharge: 2016-10-16 | Disposition: A | Discharge status: Home / Self Care | Payer: BLUE CROSS/BLUE SHIELD | Source: Ambulatory Visit | Attending: Radiation Oncology | Admitting: Radiation Oncology

## 2016-10-16 ENCOUNTER — Telehealth: Payer: Self-pay | Admitting: Internal Medicine

## 2016-10-16 ENCOUNTER — Other Ambulatory Visit: Payer: BLUE CROSS/BLUE SHIELD

## 2016-10-16 ENCOUNTER — Ambulatory Visit: Payer: BLUE CROSS/BLUE SHIELD

## 2016-10-16 DIAGNOSIS — Z510 Encounter for antineoplastic radiation therapy: Secondary | ICD-10-CM | POA: Diagnosis not present

## 2016-10-16 NOTE — Telephone Encounter (Signed)
Please chang all appointments except 4/4 to Mondays if possible so that patient's wife can come with him to appointment   [phone] call back number

## 2016-10-17 ENCOUNTER — Ambulatory Visit
Admission: RE | Admit: 2016-10-17 | Discharge: 2016-10-17 | Disposition: A | Discharge status: Home / Self Care | Payer: BLUE CROSS/BLUE SHIELD | Source: Ambulatory Visit | Attending: Radiation Oncology | Admitting: Radiation Oncology

## 2016-10-17 ENCOUNTER — Other Ambulatory Visit: Payer: BLUE CROSS/BLUE SHIELD

## 2016-10-17 ENCOUNTER — Ambulatory Visit (HOSPITAL_BASED_OUTPATIENT_CLINIC_OR_DEPARTMENT_OTHER): Payer: BLUE CROSS/BLUE SHIELD

## 2016-10-17 ENCOUNTER — Ambulatory Visit: Payer: BLUE CROSS/BLUE SHIELD

## 2016-10-17 VITALS — BP 120/55 | HR 90 | Temp 97.5°F | Resp 20

## 2016-10-17 DIAGNOSIS — C3492 Malignant neoplasm of unspecified part of left bronchus or lung: Secondary | ICD-10-CM

## 2016-10-17 DIAGNOSIS — C3412 Malignant neoplasm of upper lobe, left bronchus or lung: Secondary | ICD-10-CM | POA: Diagnosis not present

## 2016-10-17 DIAGNOSIS — Z510 Encounter for antineoplastic radiation therapy: Secondary | ICD-10-CM | POA: Diagnosis not present

## 2016-10-17 DIAGNOSIS — Z5189 Encounter for other specified aftercare: Secondary | ICD-10-CM | POA: Diagnosis not present

## 2016-10-17 MED ORDER — PEGFILGRASTIM INJECTION 6 MG/0.6ML ~~LOC~~
6.0000 mg | PREFILLED_SYRINGE | Freq: Once | SUBCUTANEOUS | Status: AC
  Administered 2016-10-17: 6 mg via SUBCUTANEOUS
  Filled 2016-10-17: qty 0.6

## 2016-10-17 NOTE — Patient Instructions (Signed)
Pegfilgrastim injection What is this medicine? PEGFILGRASTIM (PEG fil gra stim) is a long-acting granulocyte colony-stimulating factor that stimulates the growth of neutrophils, a type of white blood cell important in the body's fight against infection. It is used to reduce the incidence of fever and infection in patients with certain types of cancer who are receiving chemotherapy that affects the bone marrow, and to increase survival after being exposed to high doses of radiation. This medicine may be used for other purposes; ask your health care provider or pharmacist if you have questions. COMMON BRAND NAME(S): Neulasta What should I tell my health care provider before I take this medicine? They need to know if you have any of these conditions: -kidney disease -latex allergy -ongoing radiation therapy -sickle cell disease -skin reactions to acrylic adhesives (On-Body Injector only) -an unusual or allergic reaction to pegfilgrastim, filgrastim, other medicines, foods, dyes, or preservatives -pregnant or trying to get pregnant -breast-feeding How should I use this medicine? This medicine is for injection under the skin. If you get this medicine at home, you will be taught how to prepare and give the pre-filled syringe or how to use the On-body Injector. Refer to the patient Instructions for Use for detailed instructions. Use exactly as directed. Take your medicine at regular intervals. Do not take your medicine more often than directed. It is important that you put your used needles and syringes in a special sharps container. Do not put them in a trash can. If you do not have a sharps container, call your pharmacist or healthcare provider to get one. Talk to your pediatrician regarding the use of this medicine in children. While this drug may be prescribed for selected conditions, precautions do apply. Overdosage: If you think you have taken too much of this medicine contact a poison control  center or emergency room at once. NOTE: This medicine is only for you. Do not share this medicine with others. What if I miss a dose? It is important not to miss your dose. Call your doctor or health care professional if you miss your dose. If you miss a dose due to an On-body Injector failure or leakage, a new dose should be administered as soon as possible using a single prefilled syringe for manual use. What may interact with this medicine? Interactions have not been studied. Give your health care provider a list of all the medicines, herbs, non-prescription drugs, or dietary supplements you use. Also tell them if you smoke, drink alcohol, or use illegal drugs. Some items may interact with your medicine. This list may not describe all possible interactions. Give your health care provider a list of all the medicines, herbs, non-prescription drugs, or dietary supplements you use. Also tell them if you smoke, drink alcohol, or use illegal drugs. Some items may interact with your medicine. What should I watch for while using this medicine? You may need blood work done while you are taking this medicine. If you are going to need a MRI, CT scan, or other procedure, tell your doctor that you are using this medicine (On-Body Injector only). What side effects may I notice from receiving this medicine? Side effects that you should report to your doctor or health care professional as soon as possible: -allergic reactions like skin rash, itching or hives, swelling of the face, lips, or tongue -dizziness -fever -pain, redness, or irritation at site where injected -pinpoint red spots on the skin -red or dark-brown urine -shortness of breath or breathing problems -stomach or   side pain, or pain at the shoulder -swelling -tiredness -trouble passing urine or change in the amount of urine Side effects that usually do not require medical attention (report to your doctor or health care professional if they  continue or are bothersome): -bone pain -muscle pain This list may not describe all possible side effects. Call your doctor for medical advice about side effects. You may report side effects to FDA at 1-800-FDA-1088. Where should I keep my medicine? Keep out of the reach of children. Store pre-filled syringes in a refrigerator between 2 and 8 degrees C (36 and 46 degrees F). Do not freeze. Keep in carton to protect from light. Throw away this medicine if it is left out of the refrigerator for more than 48 hours. Throw away any unused medicine after the expiration date. NOTE: This sheet is a summary. It may not cover all possible information. If you have questions about this medicine, talk to your doctor, pharmacist, or health care provider.  2017 Elsevier/Gold Standard (2014-08-10 14:30:14)  

## 2016-10-20 ENCOUNTER — Ambulatory Visit
Admission: RE | Admit: 2016-10-20 | Discharge: 2016-10-20 | Disposition: A | Discharge status: Home / Self Care | Payer: BLUE CROSS/BLUE SHIELD | Source: Ambulatory Visit | Attending: Radiation Oncology | Admitting: Radiation Oncology

## 2016-10-20 DIAGNOSIS — Z510 Encounter for antineoplastic radiation therapy: Secondary | ICD-10-CM | POA: Diagnosis not present

## 2016-10-21 ENCOUNTER — Ambulatory Visit
Admission: RE | Admit: 2016-10-21 | Discharge: 2016-10-21 | Disposition: A | Discharge status: Home / Self Care | Payer: BLUE CROSS/BLUE SHIELD | Source: Ambulatory Visit | Attending: Radiation Oncology | Admitting: Radiation Oncology

## 2016-10-21 ENCOUNTER — Ambulatory Visit (HOSPITAL_BASED_OUTPATIENT_CLINIC_OR_DEPARTMENT_OTHER): Payer: BLUE CROSS/BLUE SHIELD | Admitting: Nurse Practitioner

## 2016-10-21 ENCOUNTER — Emergency Department (HOSPITAL_COMMUNITY): Payer: BLUE CROSS/BLUE SHIELD

## 2016-10-21 ENCOUNTER — Encounter: Payer: Self-pay | Admitting: Nurse Practitioner

## 2016-10-21 ENCOUNTER — Other Ambulatory Visit: Payer: Self-pay

## 2016-10-21 ENCOUNTER — Encounter: Payer: Self-pay | Admitting: Radiation Oncology

## 2016-10-21 ENCOUNTER — Observation Stay (HOSPITAL_COMMUNITY)
Admission: EM | Admit: 2016-10-21 | Discharge: 2016-10-22 | Disposition: A | Discharge status: Home / Self Care | Payer: BLUE CROSS/BLUE SHIELD | Attending: Internal Medicine | Admitting: Internal Medicine

## 2016-10-21 ENCOUNTER — Telehealth: Payer: Self-pay | Admitting: Oncology

## 2016-10-21 ENCOUNTER — Encounter (HOSPITAL_COMMUNITY): Payer: Self-pay

## 2016-10-21 ENCOUNTER — Encounter (HOSPITAL_COMMUNITY): Payer: Self-pay | Admitting: Internal Medicine

## 2016-10-21 VITALS — BP 108/63 | HR 79 | Temp 98.2°F | Resp 18

## 2016-10-21 VITALS — BP 138/73 | HR 112 | Temp 98.0°F | Ht 72.0 in | Wt 218.0 lb

## 2016-10-21 DIAGNOSIS — Z7984 Long term (current) use of oral hypoglycemic drugs: Secondary | ICD-10-CM | POA: Insufficient documentation

## 2016-10-21 DIAGNOSIS — E1151 Type 2 diabetes mellitus with diabetic peripheral angiopathy without gangrene: Secondary | ICD-10-CM | POA: Insufficient documentation

## 2016-10-21 DIAGNOSIS — I4891 Unspecified atrial fibrillation: Secondary | ICD-10-CM

## 2016-10-21 DIAGNOSIS — E785 Hyperlipidemia, unspecified: Secondary | ICD-10-CM | POA: Diagnosis not present

## 2016-10-21 DIAGNOSIS — C3492 Malignant neoplasm of unspecified part of left bronchus or lung: Secondary | ICD-10-CM | POA: Diagnosis not present

## 2016-10-21 DIAGNOSIS — Z8673 Personal history of transient ischemic attack (TIA), and cerebral infarction without residual deficits: Secondary | ICD-10-CM

## 2016-10-21 DIAGNOSIS — I69354 Hemiplegia and hemiparesis following cerebral infarction affecting left non-dominant side: Secondary | ICD-10-CM | POA: Diagnosis not present

## 2016-10-21 DIAGNOSIS — Z87891 Personal history of nicotine dependence: Secondary | ICD-10-CM | POA: Diagnosis not present

## 2016-10-21 DIAGNOSIS — J449 Chronic obstructive pulmonary disease, unspecified: Secondary | ICD-10-CM

## 2016-10-21 DIAGNOSIS — E669 Obesity, unspecified: Secondary | ICD-10-CM | POA: Diagnosis present

## 2016-10-21 DIAGNOSIS — G4700 Insomnia, unspecified: Secondary | ICD-10-CM | POA: Insufficient documentation

## 2016-10-21 DIAGNOSIS — Z7982 Long term (current) use of aspirin: Secondary | ICD-10-CM | POA: Diagnosis not present

## 2016-10-21 DIAGNOSIS — I480 Paroxysmal atrial fibrillation: Secondary | ICD-10-CM | POA: Diagnosis present

## 2016-10-21 DIAGNOSIS — D649 Anemia, unspecified: Secondary | ICD-10-CM

## 2016-10-21 DIAGNOSIS — E1169 Type 2 diabetes mellitus with other specified complication: Secondary | ICD-10-CM | POA: Diagnosis not present

## 2016-10-21 DIAGNOSIS — Z95820 Peripheral vascular angioplasty status with implants and grafts: Secondary | ICD-10-CM | POA: Insufficient documentation

## 2016-10-21 DIAGNOSIS — I10 Essential (primary) hypertension: Secondary | ICD-10-CM | POA: Diagnosis not present

## 2016-10-21 DIAGNOSIS — I6529 Occlusion and stenosis of unspecified carotid artery: Secondary | ICD-10-CM | POA: Diagnosis not present

## 2016-10-21 DIAGNOSIS — D61810 Antineoplastic chemotherapy induced pancytopenia: Secondary | ICD-10-CM | POA: Insufficient documentation

## 2016-10-21 DIAGNOSIS — Z96652 Presence of left artificial knee joint: Secondary | ICD-10-CM | POA: Diagnosis not present

## 2016-10-21 DIAGNOSIS — Z79899 Other long term (current) drug therapy: Secondary | ICD-10-CM | POA: Diagnosis not present

## 2016-10-21 DIAGNOSIS — C3490 Malignant neoplasm of unspecified part of unspecified bronchus or lung: Secondary | ICD-10-CM

## 2016-10-21 DIAGNOSIS — Z510 Encounter for antineoplastic radiation therapy: Secondary | ICD-10-CM | POA: Diagnosis not present

## 2016-10-21 DIAGNOSIS — E119 Type 2 diabetes mellitus without complications: Secondary | ICD-10-CM

## 2016-10-21 HISTORY — DX: Malignant neoplasm of unspecified part of unspecified bronchus or lung: C34.90

## 2016-10-21 LAB — BASIC METABOLIC PANEL
Anion gap: 6 (ref 5–15)
BUN: 17 mg/dL (ref 6–20)
CALCIUM: 9.1 mg/dL (ref 8.9–10.3)
CO2: 25 mmol/L (ref 22–32)
Chloride: 103 mmol/L (ref 101–111)
Creatinine, Ser: 0.89 mg/dL (ref 0.61–1.24)
GFR calc Af Amer: >60 mL/min (ref >60)
GFR calc non Af Amer: >60 mL/min (ref >60)
GLUCOSE: 160 mg/dL — AB (ref 65–99)
Potassium: 3.8 mmol/L (ref 3.5–5.1)
Sodium: 134 mmol/L — ABNORMAL LOW (ref 135–145)

## 2016-10-21 LAB — ACID FAST CULTURE WITH REFLEXED SENSITIVITIES (MYCOBACTERIA): Acid Fast Culture: NEGATIVE

## 2016-10-21 LAB — CBG MONITORING, ED: Glucose-Capillary: 162 mg/dL — ABNORMAL HIGH (ref 65–99)

## 2016-10-21 LAB — GLUCOSE, CAPILLARY: GLUCOSE-CAPILLARY: 212 mg/dL — AB (ref 65–99)

## 2016-10-21 LAB — TROPONIN I: Troponin I: <0.03 ng/mL (ref <0.03)

## 2016-10-21 LAB — MRSA PCR SCREENING: MRSA by PCR: NEGATIVE

## 2016-10-21 LAB — T4, FREE: FREE T4: 1.25 ng/dL — AB (ref 0.61–1.12)

## 2016-10-21 LAB — MAGNESIUM: Magnesium: 1.8 mg/dL (ref 1.7–2.4)

## 2016-10-21 LAB — TSH: TSH: 0.234 u[IU]/mL — ABNORMAL LOW (ref 0.350–4.500)

## 2016-10-21 MED ORDER — METOPROLOL SUCCINATE ER 25 MG PO TB24
50.0000 mg | ORAL_TABLET | Freq: Every day | ORAL | Status: DC
  Administered 2016-10-22: 50 mg via ORAL
  Filled 2016-10-21: qty 2

## 2016-10-21 MED ORDER — EMOLLIENT BASE EX CREA: TOPICAL_CREAM | Freq: Two times a day (BID) | CUTANEOUS | Status: DC

## 2016-10-21 MED ORDER — ASPIRIN EC 81 MG PO TBEC
81.0000 mg | DELAYED_RELEASE_TABLET | Freq: Every day | ORAL | Status: DC
  Administered 2016-10-22: 81 mg via ORAL
  Filled 2016-10-21: qty 1

## 2016-10-21 MED ORDER — DILTIAZEM HCL-DEXTROSE 100-5 MG/100ML-% IV SOLN (PREMIX)
5.0000 mg/h | INTRAVENOUS | Status: DC
  Administered 2016-10-21: 5 mg/h via INTRAVENOUS
  Filled 2016-10-21 (×2): qty 100

## 2016-10-21 MED ORDER — METFORMIN HCL 500 MG PO TABS
500.0000 mg | ORAL_TABLET | Freq: Three times a day (TID) | ORAL | Status: DC
  Administered 2016-10-22: 500 mg via ORAL
  Filled 2016-10-21: qty 1

## 2016-10-21 MED ORDER — ACETAMINOPHEN 325 MG PO TABS: 650.0000 mg | ORAL_TABLET | ORAL | Status: DC | PRN

## 2016-10-21 MED ORDER — PIOGLITAZONE HCL 15 MG PO TABS
15.0000 mg | ORAL_TABLET | Freq: Every day | ORAL | Status: DC
  Filled 2016-10-21: qty 1

## 2016-10-21 MED ORDER — DILTIAZEM HCL-DEXTROSE 100-5 MG/100ML-% IV SOLN (PREMIX)
5.0000 mg/h | INTRAVENOUS | Status: DC
  Administered 2016-10-21: 15 mg/h via INTRAVENOUS
  Filled 2016-10-21: qty 100

## 2016-10-21 MED ORDER — ZOLPIDEM TARTRATE 10 MG PO TABS
10.0000 mg | ORAL_TABLET | Freq: Every evening | ORAL | Status: DC | PRN
  Administered 2016-10-21: 10 mg via ORAL
  Filled 2016-10-21: qty 1

## 2016-10-21 MED ORDER — ROSUVASTATIN CALCIUM 20 MG PO TABS
40.0000 mg | ORAL_TABLET | Freq: Every evening | ORAL | Status: DC
  Administered 2016-10-21: 40 mg via ORAL
  Filled 2016-10-21: qty 2

## 2016-10-21 MED ORDER — SODIUM CHLORIDE 0.9% FLUSH: 3.0000 mL | INTRAVENOUS | Status: DC | PRN

## 2016-10-21 MED ORDER — ENOXAPARIN SODIUM 100 MG/ML ~~LOC~~ SOLN
1.0000 mg/kg | Freq: Two times a day (BID) | SUBCUTANEOUS | Status: DC
  Administered 2016-10-21 – 2016-10-22 (×2): 100 mg via SUBCUTANEOUS
  Filled 2016-10-21 (×2): qty 1

## 2016-10-21 MED ORDER — SODIUM CHLORIDE 0.9% FLUSH
3.0000 mL | Freq: Two times a day (BID) | INTRAVENOUS | Status: DC
  Administered 2016-10-21 – 2016-10-22 (×2): 3 mL via INTRAVENOUS

## 2016-10-21 MED ORDER — ONDANSETRON HCL 4 MG/2ML IJ SOLN: 4.0000 mg | Freq: Four times a day (QID) | INTRAMUSCULAR | Status: DC | PRN

## 2016-10-21 MED ORDER — SONAFINE EX EMUL
1.0000 "application " | Freq: Two times a day (BID) | CUTANEOUS | Status: DC
  Administered 2016-10-22: 1 via TOPICAL
  Filled 2016-10-21: qty 1

## 2016-10-21 MED ORDER — GLIMEPIRIDE 4 MG PO TABS
4.0000 mg | ORAL_TABLET | Freq: Every day | ORAL | Status: DC
  Administered 2016-10-22: 4 mg via ORAL
  Filled 2016-10-21: qty 1

## 2016-10-21 MED ORDER — SODIUM CHLORIDE 0.9 % IV BOLUS (SEPSIS)
500.0000 mL | Freq: Once | INTRAVENOUS | Status: AC
  Administered 2016-10-21: 500 mL via INTRAVENOUS

## 2016-10-21 MED ORDER — SODIUM CHLORIDE 0.9 % IV SOLN
250.0000 mL | INTRAVENOUS | Status: DC | PRN
  Administered 2016-10-21: 250 mL via INTRAVENOUS

## 2016-10-21 MED ORDER — FENOFIBRATE 160 MG PO TABS
160.0000 mg | ORAL_TABLET | Freq: Every day | ORAL | Status: DC
  Administered 2016-10-22: 160 mg via ORAL
  Filled 2016-10-21: qty 1

## 2016-10-21 MED ORDER — CILOSTAZOL 100 MG PO TABS
100.0000 mg | ORAL_TABLET | Freq: Two times a day (BID) | ORAL | Status: DC
  Administered 2016-10-21 – 2016-10-22 (×2): 100 mg via ORAL
  Filled 2016-10-21 (×3): qty 1

## 2016-10-21 MED ORDER — DILTIAZEM LOAD VIA INFUSION
10.0000 mg | Freq: Once | INTRAVENOUS | Status: AC
  Administered 2016-10-21: 10 mg via INTRAVENOUS
  Filled 2016-10-21: qty 10

## 2016-10-21 NOTE — ED Triage Notes (Signed)
Pt sent by [facility] d/t new onset A Fib.   Sts intermittent dizziness x 3 days.  Denies pain.  Pt was at [facility] for radiation.  Last chemo x 1 week ago.  Hx of lung CA.  No lab work performed at [facility].

## 2016-10-21 NOTE — ED Provider Notes (Addendum)
[facility] DEPT Provider Note   CSN: [id number] Arrival date & time: 10/21/16  1126     History   Chief Complaint Chief Complaint  Patient presents with  . Atrial Fibrillation  . Dizziness    HPI Jesse Avila is a 66 y.o. male.  HPI Patient was sent from cancer center with new onset A. fib with RVR. He is therefore radiation for his lung cancer. Last chemotherapy was a week ago. States he has felt a little dizzy for the last 2 weeks but does not feel any different otherwise. No chest pain. Does not feel his heart racing. No real shortness of breath. No swelling in his legs. No fevers. He states he had had a little bit of a cold a week ago and had been on some cold medicine for it. Seen in the ER 1 month ago and workup for pulmonary embolism at that time was negative.   Past Medical History:  Diagnosis Date  . Carotid artery occlusion   . DJD (degenerative joint disease) of knee   . Essential hypertension, benign    takes Benicar daily  . Goals of care, counseling/discussion 09/18/2016  . History of colon polyps    benign  . History of gout   . Hyperlipidemia    takes Fenofibrate and Crestor daily  . Insomnia    takes Ambien nightly as needed  . Joint pain   . Nocturia   . Peripheral vascular disease ([place])   . Primary localized osteoarthritis of left knee 12/26/2014  . Stroke [facility]) 2015?   takes Plavix daily as well as Pletal,not on plavix at present 05/15/15  . 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 Small cell lung carcinoma, left ([place]) 09/18/2016  . Goals of care, counseling/discussion 09/18/2016  . Encounter for antineoplastic chemotherapy 09/18/2016  . Hilar lymphadenopathy   . Mass of upper lobe of left lung 09/03/2016  . COPD (chronic obstructive pulmonary disease) ([place]) 09/03/2016  . PAD  (peripheral artery disease) ([place]) 05/17/2015  . Primary localized osteoarthritis of left knee 12/26/2014  . Knee osteoarthritis 12/26/2014  . Occlusion and stenosis of carotid artery without mention of cerebral infarction 04/21/2014  . History of stroke 04/21/2014  . Carotid stenosis with cerebral infarction less than 8 weeks ago 04/16/2014  . Diabetes mellitus, type 2 ([place]) 04/14/2014  . Obesity 04/14/2014  . Insomnia 10/18/2013  . Preop cardiovascular exam 12/12/2011  . DJD (degenerative joint disease) of knee   . ED (erectile dysfunction)   . GERD (gastroesophageal reflux disease)   . Essential hypertension, benign   . Peripheral vascular disease ([place])   . Hyperlipidemia   . Critical lower limb ischemia 11/19/2011    Past Surgical History:  Procedure Laterality Date  . ABDOMINAL AORTAGRAM N/A 03/29/2012   Procedure: ABDOMINAL Maxcine Ham;  Surgeon: Angelia Mould, MD;  Location: [facility] CATH LAB;  Service: Cardiovascular;  Laterality: N/A;  . COLONOSCOPY    . ENDARTERECTOMY Right 05/09/2014   Procedure: RIGHT CAROTID ENDARTERECTOMY WITH PATCH ANGIOPLASTY;  Surgeon: Conrad [place], MD;  Location: [facility];  Service: Vascular;  Laterality: Right;  . ENDARTERECTOMY FEMORAL Right 05/17/2015   Procedure: ENDARTERECTOMY FEMORAL;  Surgeon: Angelia Mould, MD;  Location: [facility];  Service: Vascular;  Laterality: Right;  . ENDOBRONCHIAL ULTRASOUND Bilateral 09/08/2016   Procedure: ENDOBRONCHIAL ULTRASOUND;  Surgeon: Rigoberto Noel, MD;  Location: [facility] ENDOSCOPY;  Service: Cardiopulmonary;  Laterality: Bilateral;  . FEMORAL-POPLITEAL BYPASS GRAFT Right 05/17/2015   Procedure: BYPASS GRAFT FEMORAL-BELOW KNEE POPLITEAL ARTERY, using gortex propaten graft 6 mm x 80 cm;  Surgeon: Angelia Mould, MD;  Location: [facility];  Service: Vascular;  Laterality: Right;  . IR GENERIC HISTORICAL  09/25/2016   IR FLUORO GUIDE PORT INSERTION RIGHT 09/25/2016 Markus Daft, MD [facility]-INTERV RAD  . IR GENERIC HISTORICAL   09/25/2016   IR US GUIDE VASC ACCESS RIGHT 09/25/2016 Markus Daft, MD [facility]-INTERV RAD  . KNEE ARTHROSCOPY Right 11/2009  . LOWER EXTREMITY ANGIOGRAM Bilateral 03/23/2015   Procedure: Lower Extremity Angiogram;  Surgeon: Angelia Mould, MD;  Location: [facility] CV LAB;  Service: Cardiovascular;  Laterality: Bilateral;  . MENISCUS REPAIR  11/2009  . PARTIAL KNEE ARTHROPLASTY Left 12/26/2014   Procedure: UNICOMPARTMENTAL KNEE;  Surgeon: Marchia Bond, MD;  Location: [facility];  Service: Orthopedics;  Laterality: Left;  . PATCH ANGIOPLASTY Right 05/17/2015   Procedure: VEIN PATCH ANGIOPLASTY ILEOFEMORAL ARTERY;  Surgeon: Angelia Mould, MD;  Location: [facility];  Service: Vascular;  Laterality: Right;  . PERCUTANEOUS STENT INTERVENTION N/A 05/10/2012   Procedure: PERCUTANEOUS STENT INTERVENTION;  Surgeon: Angelia Mould, MD;  Location: [facility] CATH LAB;  Service: Cardiovascular;  Laterality: N/A;  . PERIPHERAL VASCULAR CATHETERIZATION N/A 03/23/2015   Procedure: Abdominal Aortogram;  Surgeon: Angelia Mould, MD;  Location: [facility] CV LAB;  Service: Cardiovascular;  Laterality: N/A;  . STENTS     PLACED IN ??BOTH LEGS   2013?       Home Medications    Prior to Admission medications   Medication Sig Start Date End Date Taking? Authorizing Provider  aspirin EC 81 MG tablet Take 81 mg by mouth daily.   Yes Historical Provider, MD  cilostazol (PLETAL) 100 MG tablet Take 100 mg by mouth 2 (two) times daily.    Yes Historical Provider, MD  Doxylamine-Phenylephrine-APAP (SINUS & CONGESTION DAY/NIGHT PO) Take 1 tablet by mouth daily as needed (allergies).   Yes Historical Provider, MD  emollient (BIAFINE) cream Apply topically 2 (two) times daily.   Yes Historical Provider, MD  fenofibrate 160 MG tablet Take 160 mg by mouth daily.   Yes Historical Provider, MD  glimepiride (AMARYL) 4 MG tablet Take 4 mg by mouth daily with breakfast.   Yes Historical Provider, MD  lidocaine-prilocaine  (EMLA) cream Apply 1 application topically as needed. 09/18/16  Yes Curt Bears, MD  metFORMIN (GLUCOPHAGE) 500 MG tablet Take 1 tablet (500 mg total) by mouth 3 (three) times daily. 04/18/14  Yes [name], DO  metoprolol succinate (TOPROL-XL) 50 MG 24 hr tablet Take 50 mg by mouth daily. Take with or immediately following a meal.   Yes Historical Provider, MD  pioglitazone (ACTOS) 15 MG tablet Take 15 mg by mouth daily.   Yes Historical Provider, MD  prochlorperazine (COMPAZINE) 10 MG tablet Take 1 tablet (10 mg total) by mouth every 6 (six) hours as needed for nausea or vomiting. 09/18/16  Yes Curt Bears, MD  rosuvastatin (CRESTOR) 40 MG tablet Take 40 mg by mouth every evening. For cholesterol   Yes Historical Provider, MD  zolpidem (AMBIEN) 10 MG tablet Take 10 mg by mouth at bedtime as needed for sleep.  01/26/15  Yes Historical Provider, MD  Blood Glucose Monitoring Suppl (ACCU-CHEK NANO SMARTVIEW) w/Device KIT by Does not apply route.    Historical Provider, MD  HYDROcodone-acetaminophen (NORCO/VICODIN)  5-325 MG tablet Take 1 tablet by mouth every 4 (four) hours as needed for moderate pain. Patient not taking: Reported on 10/14/2016 09/21/16   Jola Schmidt, MD    Family History Family History  Problem Relation Age of Onset  . Diabetes Mother   . Hypertension Mother   . Heart disease Mother     Coronary Artery Bypass Graft  . Hyperlipidemia Mother   . Heart attack Mother   . Hypertension Father   . Hyperlipidemia Sister   . Stroke Sister     Social History Social History  Substance Use Topics  . Smoking status: Former Smoker    Packs/day: 0.00    Years: 0.00  . Smokeless tobacco: Never Used     Comment: quit smoking 4 yrs ago  . Alcohol use 0.0 oz/week     Comment: beer- occasionally     Allergies   Cialis [tadalafil]   Review of Systems Review of Systems  Constitutional: Negative for appetite change.  Respiratory: Negative for shortness of breath.     Cardiovascular: Negative for chest pain.  Gastrointestinal: Negative for abdominal pain.  Genitourinary: Negative for dysuria.  Musculoskeletal: Negative for arthralgias.  Neurological: Positive for dizziness.  Psychiatric/Behavioral: Negative for confusion.     Physical Exam Updated Vital Signs BP 137/79   Pulse 74   Temp 98.2 F (36.8 C) (Oral)   Resp (!) 22   Ht '6\' 1"'$  (1.854 m)   Wt 220 lb (99.8 kg)   SpO2 93%   BMI 29.03 kg/m   Physical Exam  Constitutional: He appears well-developed.  HENT:  Head: Atraumatic.  Eyes: EOM are normal.  Neck: Neck supple.  Cardiovascular:  Irregular tachycardia  Pulmonary/Chest: Effort normal.  Abdominal: Soft. There is no tenderness.  Musculoskeletal: He exhibits no edema.  Neurological: He is alert.  Skin: Skin is warm.  Psychiatric: His behavior is normal.     ED Treatments / Results  Labs (all labs ordered are listed, but only abnormal results are displayed) Labs Reviewed  TSH - Abnormal; Notable for the following:       Result Value   TSH 0.234 (*)    All other components within normal limits  CBC WITH DIFFERENTIAL/PLATELET - Abnormal; Notable for the following:    WBC 2.3 (*)    RBC 2.91 (*)    Hemoglobin 8.7 (*)    HCT 25.4 (*)    Platelets 117 (*)    Neutro Abs 1.6 (*)    Lymphs Abs 0.6 (*)    All other components within normal limits  BASIC METABOLIC PANEL - Abnormal; Notable for the following:    Sodium 134 (*)    Glucose, Bld 160 (*)    All other components within normal limits  TROPONIN I  MAGNESIUM    EKG  EKG Interpretation None       Radiology Dg Chest Portable 1 View  Result Date: 10/21/2016 CLINICAL DATA:  Left-sided lung carcinoma.  Atrial fibrillation. EXAM: PORTABLE CHEST 1 VIEW COMPARISON:  September 21, 2016 FINDINGS: Port-A-Cath tip is in the superior vena cava. No pneumothorax. There is again noted a mass in the left upper lobe which appears smaller compared to 1 month prior. There is  volume loss in the left upper lobe. Lungs elsewhere clear. Heart is upper normal in size with pulmonary vascularity within normal limits. There is calcification along the left hemidiaphragm. No evident adenopathy. No bone lesions. IMPRESSION: Left upper lobe mass appears somewhat smaller and less well-defined compared  to 1 month prior. Lungs elsewhere clear. Heart upper normal in size. Port-A-Cath tip in superior vena cava without pneumothorax. Calcification along left hemidiaphragm, likely due to previous asbestos exposure. Electronically Signed   By: Lowella Grip III M.D.   On: 10/21/2016 12:04    Procedures Procedures (including critical care time)  Medications Ordered in ED Medications  diltiazem (CARDIZEM) 1 mg/mL load via infusion 10 mg (10 mg Intravenous Bolus from Bag 10/21/16 1304)    And  diltiazem (CARDIZEM) 100 mg in dextrose 5% 170m (1 mg/mL) infusion (15 mg/hr Intravenous Rate/Dose Change 10/21/16 1419)  sodium chloride 0.9 % bolus 500 mL (500 mLs Intravenous New Bag/Given 10/21/16 1304)     Initial Impression / Assessment and Plan / ED Course  I have reviewed the triage vital signs and the nursing notes.  Pertinent labs & imaging results that were available during my care of the patient were reviewed by me and considered in my medical decision making (see chart for details).     Patient found to be in new onset A. fib with RVR. New-onset. Chadsvasc score of 5 for hypertension, stroke, vascular disease and diabetes. Unknown onset so he is not a candidate for ER cardioversion. He is feeling well and does not know when it started. Persistent heart rate of around 130 to 150. EKGs from cancer center reviewed and showed atrial fibrillation. X-ray and lab work reassuring overall. Does have a mild anemia but is around 1 week postchemotherapy. Has not had bleeding. This point required IV Cardizem for rate control and will increase dose for further titration. Will admit to internal  medicine. Has recently been evaluated for pulmonary embolism.   CRITICAL CARE Performed by: PMackie PaiTotal critical care time: 30 minutes Critical care time was exclusive of separately billable procedures and treating other patients. Critical care was necessary to treat or prevent imminent or life-threatening deterioration. Critical care was time spent personally by me on the following activities: development of treatment plan with patient and/or surrogate as well as nursing, discussions with consultants, evaluation of patient's response to treatment, examination of patient, obtaining history from patient or surrogate, ordering and performing treatments and interventions, ordering and review of laboratory studies, ordering and review of radiographic studies, pulse oximetry and re-evaluation of patient's condition.   Final  Clinical Impressions(s) / ED Diagnoses   Final diagnoses:  Atrial fibrillation with RVR ([place]  Malignant neoplasm of lung, unspecified laterality, unspecified part of lung ([facility]    New Prescriptions New Prescriptions   No medications on file     NDavonna Belling MD 10/21/16 1[place] MD 10/21/16 [phone]

## 2016-10-21 NOTE — Progress Notes (Signed)
EKG done.  PT tachycardic and elevated BP in Rad. Onc. Concern is for AFib.  Selena Lesser, in to see pt.  Transported to ED via w/c per Jan, RN. Wife with pt.

## 2016-10-21 NOTE — Assessment & Plan Note (Signed)
Patient received cycle 2, day 1 of his carboplatin/etoposide chemotherapy regimen on 10/13/2016.  He also received radiation treatments on a daily basis.  See further notes for details of today's visit.  Patient is scheduled for labs and flush only on 11/03/2016.  He is scheduled for labs, flush, visit, and his next cycle of chemotherapy on 11/05/2016.

## 2016-10-21 NOTE — ED Triage Notes (Signed)
Pt from cancer center here due to new onset atrial fibrillation w/ rvr. Pt was receiving radiation at cancer center today. Pt denies symptoms at this time. Pt is on chemo and radiation.

## 2016-10-21 NOTE — Assessment & Plan Note (Addendum)
Patient presented to the [facility] today to receive his daily radiation treatment.  His heart rate was in the 140-160 range; with an irregular rhyth and some mild hypertension vitals were done and radiation oncology.  Patient patient denies any chest pain, chest pressure, shortness breath, or pain with inspiration.  He also denies any other new symptoms.  Denies fevers or chills.  Exam today revealed an irregular rhythm, but basically asymptomatic.  Vital signs were stable other than an irregular tachycardia.  EKG obtained today revealed atrial fibrillation with rapid ventricular response; with ventricular rate of 140.  Patient states that he has no history of cardiac issues; and has never been diagnosed with atrial fibrillation with or other arrhythmias in the past.  Reviewed all symptoms and EKG results with Dr. Julien Nordmann; he recommended that patient be transported to the emergency department for further evaluation and management.  Brief history.  Report were called to the emergency department charge nurse; prior to the patient being transported to the emergency department via wheelchair.  Per the cancer Center nurse.  CODE STATUS: Patient should be considered a full code; since there are no advanced directives in the patient's chart.

## 2016-10-21 NOTE — ED Notes (Signed)
Unable to collect labs patient wants his port access 

## 2016-10-21 NOTE — Progress Notes (Signed)
Jesse Avila has completed 16 fractions to his left lung.  He denies having pain or shortness of broth.  He reports having an occasional dry cough.  He denies having a sore throat or trouble swallowing.  He reports having fatigue.  His next chemotherapy infusion will be on 11/05/16.  He has lost 2 lbs since last week.  He said he is having a lot of nausea.  He is taking compazine as needed. The skin on his left upper back has hyperpigmentation.  He is using biafine.  BP 138/73 (BP Location: Right Arm, Patient Position: Sitting)   Pulse (!) 112   Temp 98 F (36.7 C) (Oral)   Ht 6' (1.829 m)   Wt 218 lb (98.9 kg)   SpO2 97%   BMI 29.57 kg/m    Wt Readings from Last 3 Encounters:  10/21/16 218 lb (98.9 kg)  10/14/16 220 lb 9.6 oz (100.1 kg)  10/13/16 220 lb 11.2 oz (100.1 kg)

## 2016-10-21 NOTE — Telephone Encounter (Signed)
Called charge nurse, Altha Harm, RN in the ED.  Gave report that patient is in A Fib with rates from 146-166. She said that they are cleaning a room and will call back when ready. Notified by Dr. Sondra Come that patient will be seen by Dr. Madelyn Brunner, NP.  Called Alaina back to notify her that patient will not be coming to the ED.

## 2016-10-21 NOTE — Progress Notes (Addendum)
ANTICOAGULATION CONSULT NOTE - Initial Consult  Pharmacy Consult for Lovenox Indication: atrial fibrillation  Allergies  Allergen Reactions  . Cialis [Tadalafil] Other (See Comments)    Reaction:  Headache     Patient Measurements: Height: '6\' 1"'$  (185.4 cm) Weight: 220 lb (99.8 kg) IBW/kg (Calculated) : 79.9  Vital Signs: Temp: 98.2 F (36.8 C) (03/20 1129) Temp Source: Oral (03/20 1129) BP: 130/57 (03/20 2203) Pulse Rate: 78 (03/20 2203)  Labs:  Recent Labs  10/21/16 1308 10/21/16 1848  HGB 8.7*  --   HCT 25.4*  --   PLT 117*  --   CREATININE 0.89  --   TROPONINI <0.03 <0.03    Estimated Creatinine Clearance: 105.6 mL/min (by C-G formula based on SCr of 0.89 mg/dL).   Medical History: Past Medical History:  Diagnosis Date  . Carotid artery occlusion   . DJD (degenerative joint disease) of knee   . Essential hypertension, benign    takes Benicar daily  . Goals of care, counseling/discussion 09/18/2016  . History of colon polyps    benign  . History of gout   . Hyperlipidemia    takes Fenofibrate and Crestor daily  . Insomnia    takes Ambien nightly as needed  . Joint pain   . Lung cancer ([place])   . Nocturia   . Peripheral vascular disease ([place])   . Primary localized osteoarthritis of left knee 12/26/2014  . Stroke [facility]) 2015?   takes Plavix daily as well as Pletal,not on plavix at present 05/15/15  . 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 [START ON 10/22/2016] aspirin EC  81 mg Oral Daily  . cilostazol  100 mg Oral BID  . enoxaparin (LOVENOX) injection  1 mg/kg Subcutaneous Q12H  . [START ON 10/22/2016] fenofibrate  160 mg Oral Daily  . [START ON 10/22/2016] glimepiride  4 mg Oral Q breakfast  . [START ON 10/22/2016] metFORMIN  500 mg Oral TID WC  . [START ON 10/22/2016] metoprolol succinate  50 mg Oral Daily  .  [START ON 10/22/2016] pioglitazone  15 mg Oral Daily  . rosuvastatin  40 mg Oral QPM  . sodium chloride flush  3 mL Intravenous Q12H  . SONAFINE  1 application Topical BID    Assessment:  66 yr male sent from cancer center to ED with new onset AFib with RVR.  PMH significant for lung cancer; patient undergoing chemo and radiation.  Patient on no oral anticoagulation PTA  Goal of Therapy:  Monitor platelets by anticoagulation protocol: Yes   Plan:   Lovenox '1mg'$ /kg sq q12h  Follow renal function, CBC  Kennethia Lynes, Toribio Harbour, PharmD 10/21/2016,10:26 PM

## 2016-10-21 NOTE — ED Notes (Signed)
Bed: WA01 Expected date:  Expected time:  Means of arrival:  Comments: Pt from cancer center, new onset afib rvr

## 2016-10-21 NOTE — Progress Notes (Addendum)
SYMPTOM MANAGEMENT CLINIC    Chief Complaint: New onset atrial fib  HPI:  Jesse Avila 66 y.o. male diagnosed with lung cancer.  Currently undergoing carboplatin/etoposide chemotherapy regimen and radiation treatments.   Oncology History   Patient presented with s/o hoarseness.  Work up showed left lung mass.   Cancer Staging Small cell lung carcinoma, left (HCC) Staging form: Lung, AJCC 8th Edition - Clinical: Stage IVB (cT4, cN2, cM1c) - Signed by Curt Bears, MD on 09/18/2016       Small cell lung carcinoma, left ([place])   09/01/2016 Imaging    CXR      09/02/2016 Imaging    CTA      09/18/2016 Initial Diagnosis    Small cell lung carcinoma, left ([place])      Review of Systems  Cardiovascular:       Tachycardia  All other systems reviewed and are negative.   Past Medical History:  Diagnosis Date  . Carotid artery occlusion   . DJD (degenerative joint disease) of knee   . Essential hypertension, benign    takes Benicar daily  . Goals of care, counseling/discussion 09/18/2016  . History of colon polyps    benign  . History of gout   . Hyperlipidemia    takes Fenofibrate and Crestor daily  . Insomnia    takes Ambien nightly as needed  . Joint pain   . Lung cancer ([place])   . Nocturia   . Peripheral vascular disease ([place])   . Primary localized osteoarthritis of left knee 12/26/2014  . Stroke [facility]) 2015?   takes Plavix daily as well as Pletal,not on plavix at present 05/15/15  . 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 ABDOMINAL AORTAGRAM N/A 03/29/2012   Procedure: ABDOMINAL Maxcine Ham;  Surgeon: Angelia Mould, MD;  Location: [facility] CATH LAB;  Service: Cardiovascular;  Laterality: N/A;  . COLONOSCOPY    . ENDARTERECTOMY Right 05/09/2014   Procedure: RIGHT CAROTID ENDARTERECTOMY WITH PATCH  ANGIOPLASTY;  Surgeon: Conrad Brenas, MD;  Location: [place];  Service: Vascular;  Laterality: Right;  . ENDARTERECTOMY FEMORAL Right 05/17/2015   Procedure: ENDARTERECTOMY FEMORAL;  Surgeon: Angelia Mould, MD;  Location: [place];  Service: Vascular;  Laterality: Right;  . ENDOBRONCHIAL ULTRASOUND Bilateral 09/08/2016   Procedure: ENDOBRONCHIAL ULTRASOUND;  Surgeon: Rigoberto Noel, MD;  Location: [facility] ENDOSCOPY;  Service: Cardiopulmonary;  Laterality: Bilateral;  . FEMORAL-POPLITEAL BYPASS GRAFT Right 05/17/2015   Procedure: BYPASS GRAFT FEMORAL-BELOW KNEE POPLITEAL ARTERY, using gortex propaten graft 6 mm x 80 cm;  Surgeon: Angelia Mould, MD;  Location: [place];  Service: Vascular;  Laterality: Right;  . IR GENERIC HISTORICAL  09/25/2016   IR FLUORO GUIDE PORT INSERTION RIGHT 09/25/2016 Markus Daft, MD [facility]-INTERV RAD  . IR GENERIC HISTORICAL  09/25/2016   IR US GUIDE VASC ACCESS RIGHT 09/25/2016 Markus Daft, MD [facility]-INTERV RAD  . KNEE ARTHROSCOPY Right 11/2009  . LOWER EXTREMITY ANGIOGRAM Bilateral 03/23/2015   Procedure: Lower Extremity Angiogram;  Surgeon: Angelia Mould, MD;  Location: [place] CV LAB;  Service: Cardiovascular;  Laterality: Bilateral;  . MENISCUS REPAIR  11/2009  . PARTIAL KNEE ARTHROPLASTY Left 12/26/2014   Procedure: UNICOMPARTMENTAL KNEE;  Surgeon: Marchia Bond, MD;  Location: [place];  Service: Orthopedics;  Laterality: Left;  . PATCH ANGIOPLASTY Right 05/17/2015  Procedure: VEIN PATCH ANGIOPLASTY ILEOFEMORAL ARTERY;  Surgeon: Angelia Mould, MD;  Location: [place];  Service: Vascular;  Laterality: Right;  . PERCUTANEOUS STENT INTERVENTION N/A 05/10/2012   Procedure: PERCUTANEOUS STENT INTERVENTION;  Surgeon: Angelia Mould, MD;  Location: [facility] CATH LAB;  Service: Cardiovascular;  Laterality: N/A;  . PERIPHERAL VASCULAR CATHETERIZATION N/A 03/23/2015   Procedure: Abdominal Aortogram;  Surgeon: Angelia Mould, MD;  Location: [place] CV LAB;  Service:  Cardiovascular;  Laterality: N/A;  . STENTS     PLACED IN ??BOTH LEGS   2013?    has Critical lower limb ischemia; DJD (degenerative joint disease) of knee; ED (erectile dysfunction); GERD (gastroesophageal reflux disease); Essential hypertension, benign; Peripheral vascular disease ([place]); Hyperlipidemia; Preop cardiovascular exam; Insomnia; Diabetes mellitus, type 2 ([place]); Obesity; Carotid stenosis with cerebral infarction less than 8 weeks ago; Occlusion and stenosis of carotid artery without mention of cerebral infarction; History of stroke; Primary localized osteoarthritis of left knee; Knee osteoarthritis; PAD (peripheral artery disease) ([place]); COPD (chronic obstructive pulmonary disease) ([place]); Hilar lymphadenopathy; Small cell lung carcinoma, left ([place]); Goals of care, counseling/discussion; Encounter for antineoplastic chemotherapy; and Atrial fibrillation ([place]) on his problem list.    is allergic to cialis [tadalafil].  Allergies as of 10/21/2016      Reactions   Cialis [tadalafil] Other (See Comments)   Reaction:  Headache       Medication List       Accurate as of 10/21/16 11:26 AM. Always use your most recent med list.          ACCU-CHEK NANO SMARTVIEW w/Device Kit by Does not apply route.   aspirin EC 81 MG tablet Take 81 mg by mouth daily.   cilostazol 100 MG tablet Commonly known as:  PLETAL Take 100 mg by mouth 2 (two) times daily.   emollient cream Commonly known as:  BIAFINE Apply topically 2 (two) times daily.   fenofibrate 160 MG tablet Take 160 mg by mouth daily.   glimepiride 4 MG tablet Commonly known as:  AMARYL Take 4 mg by mouth daily with breakfast.   HYDROcodone-acetaminophen 5-325 MG tablet Commonly known as:  NORCO/VICODIN Take 1 tablet by mouth every 4 (four) hours as needed for moderate pain.   lidocaine-prilocaine cream Commonly known as:  EMLA Apply 1 application topically as needed.   metFORMIN 500 MG tablet Commonly known as:   GLUCOPHAGE Take 1 tablet (500 mg total) by mouth 3 (three) times daily.   metoprolol succinate 50 MG 24 hr tablet Commonly known as:  TOPROL-XL Take 50 mg by mouth daily. Take with or immediately following a meal.   pioglitazone 15 MG tablet Commonly known as:  ACTOS Take 15 mg by mouth daily.   prochlorperazine 10 MG tablet Commonly known as:  COMPAZINE Take 1 tablet (10 mg total) by mouth every 6 (six) hours as needed for nausea or vomiting.   rosuvastatin 40 MG tablet Commonly known as:  CRESTOR Take 40 mg by mouth every evening. For cholesterol   zolpidem 10 MG tablet Commonly known as:  AMBIEN Take 10 mg by mouth at bedtime as needed for sleep.        PHYSICAL EXAMINATION  Oncology Vitals 10/21/2016 10/21/2016  Height - -  Weight - -  Weight (lbs) - -  BMI (kg/m2) - -  Temp - -  Pulse - -  Resp 19 23  SpO2 - -  BSA (m2) - -   BP Readings from Last 2 Encounters:  10/21/16 117/77  10/21/16 108/63    Physical Exam  Constitutional: He is oriented to person, place, and time and well-developed, well-nourished, and in no distress.  HENT:  Head: Normocephalic and atraumatic.  Mouth/Throat: Oropharynx is clear and moist.  Eyes: Conjunctivae and EOM are normal. Pupils are equal, round, and reactive to light. Right eye exhibits no discharge. Left eye exhibits no discharge. No scleral icterus.  Neck: Normal range of motion. Neck supple. No JVD present. No tracheal deviation present. No thyromegaly present.  Cardiovascular: Normal heart sounds and intact distal pulses.   Irregular, tachycardic rate  Pulmonary/Chest: Effort normal and breath sounds normal. No respiratory distress. He has no wheezes. He has no rales. He exhibits no tenderness.  Abdominal: Soft. Bowel sounds are normal. He exhibits no distension and no mass. There is no tenderness. There is no rebound and no guarding.  Musculoskeletal: Normal range of motion. He exhibits no edema, tenderness or deformity.    Lymphadenopathy:    He has no cervical adenopathy.  Neurological: He is alert and oriented to person, place, and time. Gait normal.  Skin: Skin is warm and dry. No rash noted. No erythema. No pallor.  Psychiatric: Affect normal.  Nursing note and vitals reviewed.   LABORATORY DATA:. Admission on 10/21/2016  Component Date Value Ref Range Status  . TSH 10/21/2016 0.234* 0.350 - 4.500 uIU/mL Final  . Troponin I 10/21/2016 <0.03  <0.03 ng/mL Final  . WBC 10/21/2016 2.3* 4.0 - 10.5 K/uL Final  . RBC 10/21/2016 2.91* 4.22 - 5.81 MIL/uL Final  . Hemoglobin 10/21/2016 8.7* 13.0 - 17.0 g/dL Final  . HCT 10/21/2016 25.4* 39.0 - 52.0 % Final  . MCV 10/21/2016 87.3  78.0 - 100.0 fL Final  . MCH 10/21/2016 29.9  26.0 - 34.0 pg Final  . MCHC 10/21/2016 34.3  30.0 - 36.0 g/dL Final  . RDW 10/21/2016 13.1  11.5 - 15.5 % Final  . Platelets 10/21/2016 117* 150 - 400 K/uL Final  . Neutrophils Relative % 10/21/2016 69  % Final  . Lymphocytes Relative 10/21/2016 26  % Final  . Monocytes Relative 10/21/2016 5  % Final  . Eosinophils Relative 10/21/2016 0  % Final  . Basophils Relative 10/21/2016 0  % Final  . Neutro Abs 10/21/2016 1.6* 1.7 - 7.7 K/uL Final  . Lymphs Abs 10/21/2016 0.6* 0.7 - 4.0 K/uL Final  . Monocytes Absolute 10/21/2016 0.1  0.1 - 1.0 K/uL Final  . Eosinophils Absolute 10/21/2016 0.0  0.0 - 0.7 K/uL Final  . Basophils Absolute 10/21/2016 0.0  0.0 - 0.1 K/uL Final  . Smear Review 10/21/2016 LARGE PLATELETS PRESENT   Final  . Sodium 10/21/2016 134* 135 - 145 mmol/L Final  . Potassium 10/21/2016 3.8  3.5 - 5.1 mmol/L Final  . Chloride 10/21/2016 103  101 - 111 mmol/L Final  . CO2 10/21/2016 25  22 - 32 mmol/L Final  . Glucose, Bld 10/21/2016 160* 65 - 99 mg/dL Final  . BUN 10/21/2016 17  6 - 20 mg/dL Final  . Creatinine, Ser 10/21/2016 0.89  0.61 - 1.24 mg/dL Final  . Calcium 10/21/2016 9.1  8.9 - 10.3 mg/dL Final  . GFR calc non Af Amer 10/21/2016 >60  >60 mL/min Final  . GFR  calc Af Amer 10/21/2016 >60  >60 mL/min Final   Comment: (NOTE) The eGFR has been calculated using the CKD EPI equation. This calculation has not been validated in all clinical situations. eGFR's persistently <60 mL/min signify possible Chronic  Kidney Disease.   . Anion gap 10/21/2016 6  5 - 15 Final  . Magnesium 10/21/2016 1.8  1.7 - 2.4 mg/dL Final    RADIOGRAPHIC STUDIES: Dg Chest Portable 1 View  Result Date: 10/21/2016 CLINICAL DATA:  Left-sided lung carcinoma.  Atrial fibrillation. EXAM: PORTABLE CHEST 1 VIEW COMPARISON:  September 21, 2016 FINDINGS: Port-A-Cath tip is in the superior vena cava. No pneumothorax. There is again noted a mass in the left upper lobe which appears smaller compared to 1 month prior. There is volume loss in the left upper lobe. Lungs elsewhere clear. Heart is upper normal in size with pulmonary vascularity within normal limits. There is calcification along the left hemidiaphragm. No evident adenopathy. No bone lesions. IMPRESSION: Left upper lobe mass appears somewhat smaller and less well-defined compared to 1 month prior. Lungs elsewhere clear. Heart upper normal in size. Port-A-Cath tip in superior vena cava without pneumothorax. Calcification along left hemidiaphragm, likely due to previous asbestos exposure. Electronically Signed   By: Lowella Grip III M.D.   On: 10/21/2016 12:04    ASSESSMENT/PLAN:    Small cell lung carcinoma, left ([place]) Patient received cycle 2, day 1 of his carboplatin/etoposide chemotherapy regimen on 10/13/2016.  He also received radiation treatments on a daily basis.  See further notes for details of today's visit.  Patient is scheduled for labs and flush only on 11/03/2016.  He is scheduled for labs, flush, visit, and his next cycle of chemotherapy on 11/05/2016.  Atrial fibrillation [facility]) Patient presented to the [facility] today to receive his daily radiation treatment.  His heart rate was in the 140-160 range; with an  irregular rhyth and some mild hypertension vitals were done and radiation oncology.  Patient patient denies any chest pain, chest pressure, shortness breath, or pain with inspiration.  He also denies any other new symptoms.  Denies fevers or chills.  Exam today revealed an irregular rhythm, but basically asymptomatic.  Vital signs were stable other than an irregular tachycardia.  EKG obtained today revealed atrial fibrillation with rapid ventricular response; with ventricular rate of 140.  Patient states that he has no history of cardiac issues; and has never been diagnosed with atrial fibrillation with or other arrhythmias in the past.  Reviewed all symptoms and EKG results with Dr. Julien Nordmann; he recommended that patient be transported to the emergency department for further evaluation and management.  Brief history.  Report were called to the emergency department charge nurse; prior to the patient being transported to the emergency department via wheelchair.  Per the cancer Center nurse.  CODE STATUS: Patient should be considered a full code; since there are no advanced directives in the patient's chart.   Patient stated understanding of all instructions; and was in agreement with this plan of care. The patient knows to call the clinic with any problems, questions or concerns.   Total time spent with patient was 25 minutes;  with greater than 75 percent of that time spent in face to face counseling regarding patient's symptoms,  and coordination of care and follow up.  Disclaimer:This dictation was prepared with Dragon/digital dictation along with [employer]. Any transcriptional errors that result from this process are unintentional.  Drue Second, NP 10/21/2016    ADDENDUM: Hematology/Oncology Attending: I had a face to face encounter with the patient. I recommended his care plan. This is a very pleasant 67 years old white male with extensive stage small cell lung cancer  currently undergoing systemic chemotherapy with carboplatin and  etoposide concurrent with radiation under the care of Dr. Sondra Come. He is status post 2 cycles of systemic chemotherapy. His last dose was given 10/13/2016. The patient was in the radiation oncology Department when he presented with dizzy spells as well as tachycardia. He was hypotensive. He was seen at the symptom management clinic for evaluation of his condition. EKG was performed and showed that the patient was in atrial fibrillation with rapid ventricular response. I recommended for the patient to go immediately to the emergency department for evaluation and management of his condition. He will need evaluation by cardiology. We will continue to monitor his blood count closely. He was advised to call immediately if he has any other concerning symptoms in the interval.  Disclaimer: This note was dictated with voice recognition software. Similar sounding words can inadvertently be transcribed and may be missed upon review. Eilleen Kempf., MD 10/22/16

## 2016-10-21 NOTE — Progress Notes (Signed)
  Radiation Oncology         [phone] ________________________________  Name: Jesse Avila MRN: [id number]  Date: 10/21/2016  DOB: [birth year]  Weekly Radiation Therapy Management    ICD-9-CM ICD-10-CM   1. Small cell lung carcinoma, left (HCC) 162.9 C34.92      Current Dose: 32 Gy     Planned Dose:  60 Gy  Narrative . . . . . . . . The patient presents for routine under treatment assessment.                                    Jesse Avila has completed 16 fractions to his left lung. Pt denies pain or SOB. He does endorse occasional dry cough but denies sore throat or difficulty swallowing. He endorses fatigue. Pt has lost 2 lbs since last week and endorses having a lot of nausea. Pt takes Compazine prn. Pt endorses "a little more headache than normal" but attributes this to allergens. Pt states that for the last 6 days he has occasionally gotten dizzy upon standing up. Pt denies hx of afib or irregular heartbeat. Pt is not currently taking Carafate. Pt's next chemotherapy infusion will be on 11/05/16.                                  Set-up films were reviewed.                                 The chart was checked. Physical Findings. . .  height is 6' (1.829 m) and weight is 218 lb (98.9 kg). His oral temperature is 98 F (36.7 C). His blood pressure is 138/73 and his pulse is 112 (abnormal). His oxygen saturation is 97%.   Heart has irregular rate and rhythm. Lungs are clear to auscultation bilaterally. Abdomen is soft and non-tender.  Impression . . . . . . . The patient is tolerating radiation.  The patient noticed more SOB and dizziness recently and was noted to have irregular heart rate on exam. The monitor in our clinic showed the patient's heart rate to be around 155 and probable afib.Marland Kitchen He will be sent to med/onc or the ER for additional evaluation. Plan . . . . . . . . . . . . Continue treatment as planned.   _______________________________   Blair Promise, PhD, MD  This document  serves as a record of services personally performed by Gery Pray, MD. It was created on his behalf by Maryla Morrow, a trained medical scribe. The creation of this record is based on the scribe's personal observations and the provider's statements to them. This document has been checked and approved by the attending provider.

## 2016-10-21 NOTE — ED Notes (Signed)
WILL TRANSPORT PT TO 2W 1241-1. AAOX4. PT IN NO APPARENT DISTRESS OR PAIN. IVF INFUSING W/O PAIN OR SWELLING. THE OPPORTUNITY TO ASK QUESTIONS WAS PROVIDED.

## 2016-10-21 NOTE — H&P (Signed)
History and Physical:    Jesse Avila   NLZ:[id number] DOB: May 14, 1951 DOA: 10/21/2016  Referring MD/provider: Dr. Alvino Chapel PCP: Simona Huh, MD   Patient coming from: [place]  Chief Complaint: Atrial fibrillation   History of Present Illness:   Medical records reviewed and are as summarized:  Jesse Avila is an 66 y.o. 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 2X6.9 at 6.6 cm irregular mass in the aorticopulmonary window of the mediastinum concerning for malignancy. He subsequently underwent a video bronchoscopy/biopsy. PET scan was negative for hypermetabolic adenopathy in the chest. The patient is currently undergoing concurrent chemotherapy (etoposide and carboplatin) and radiation therapy, and saw Dr. Sondra Come today who noted that his pulse was irregular and rapid. The patient endorses a 3 day history of dizziness, but no chest pain or shortness of breath. He has never had palpitations. He is currently asymptomatic. Upon initial presentation in the ED, the patient was noted to be in atrial fibrillation with RVR.  ED Course:  The patient was placed on a Cardizem drip with a CHADSVASC score of 5. Twelve-lead EKG confirmed atrial fibrillation with RVR. TSH noted to be low at 0.234. Chest x-ray negative for heart failure or cardiomegaly. Initial troponin < 0.03.  ROS:   Review of Systems  Constitutional: Positive for weight loss.  HENT:       Hoarseness  Eyes: Negative.   Respiratory: Positive for cough. Negative for shortness of breath and wheezing.   Cardiovascular: Negative for chest pain, palpitations and leg  swelling.  Gastrointestinal: Positive for constipation and nausea. Negative for blood in stool, heartburn and melena.  Genitourinary: Negative.   Musculoskeletal: Negative.   Skin: Negative.   Neurological: Positive for dizziness and headaches.  Endo/Heme/Allergies: Negative for polydipsia.    Past Medical History:   Past Medical History:  Diagnosis Date  . Carotid artery occlusion   . DJD (degenerative joint disease) of knee   . Essential hypertension, benign    takes Benicar daily  . Goals of care, counseling/discussion 09/18/2016  . History of colon polyps    benign  . History of gout   . Hyperlipidemia    takes Fenofibrate and Crestor daily  . Insomnia    takes Ambien nightly as needed  . Joint pain   . Lung cancer ([place])   . Nocturia   . Peripheral vascular disease ([place])   . Primary localized osteoarthritis of left knee 12/26/2014  . Stroke [facility]) 2015?   takes Plavix daily as well as Pletal,not on plavix at present 05/15/15  . 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 ABDOMINAL AORTAGRAM N/A 03/29/2012   Procedure: ABDOMINAL Maxcine Ham;  Surgeon: Angelia Mould, MD;  Location: [facility] CATH LAB;  Service: Cardiovascular;  Laterality: N/A;  . COLONOSCOPY    . ENDARTERECTOMY Right 05/09/2014   Procedure: RIGHT CAROTID ENDARTERECTOMY WITH PATCH ANGIOPLASTY;  Surgeon: Conrad Luna Pier, MD;  Location: [place];  Service:  Vascular;  Laterality: Right;  . ENDARTERECTOMY FEMORAL Right 05/17/2015   Procedure: ENDARTERECTOMY FEMORAL;  Surgeon: Angelia Mould, MD;  Location: [place];  Service: Vascular;  Laterality: Right;  . ENDOBRONCHIAL ULTRASOUND Bilateral 09/08/2016   Procedure: ENDOBRONCHIAL ULTRASOUND;  Surgeon: Rigoberto Noel, MD;  Location: [facility] ENDOSCOPY;  Service: Cardiopulmonary;   Laterality: Bilateral;  . FEMORAL-POPLITEAL BYPASS GRAFT Right 05/17/2015   Procedure: BYPASS GRAFT FEMORAL-BELOW KNEE POPLITEAL ARTERY, using gortex propaten graft 6 mm x 80 cm;  Surgeon: Angelia Mould, MD;  Location: [place];  Service: Vascular;  Laterality: Right;  . IR GENERIC HISTORICAL  09/25/2016   IR FLUORO GUIDE PORT INSERTION RIGHT 09/25/2016 Markus Daft, MD [facility]-INTERV RAD  . IR GENERIC HISTORICAL  09/25/2016   IR US GUIDE VASC ACCESS RIGHT 09/25/2016 Markus Daft, MD [facility]INTER[facility]  . KNEE ARTHROSCOPY Right 11/2009  . LOWER EXTREMITY ANGIOGRAM Bilateral 03/23/2015   Procedure: Lower Extremity Angiogram;  Surgeon: Angelia Mould, MD;  Location: [place] CV LAB;  Service: Cardiovascular;  Laterality: Bilateral;  . MENISCUS REPAIR  11/2009  . PARTIAL KNEE ARTHROPLASTY Left 12/26/2014   Procedure: UNICOMPARTMENTAL KNEE;  Surgeon: Marchia Bond, MD;  Location: [place];  Service: Orthopedics;  Laterality: Left;  . PATCH ANGIOPLASTY Right 05/17/2015   Procedure: VEIN PATCH ANGIOPLASTY ILEOFEMORAL ARTERY;  Surgeon: Angelia Mould, MD;  Location: [place];  Service: Vascular;  Laterality: Right;  . PERCUTANEOUS STENT INTERVENTION N/A 05/10/2012   Procedure: PERCUTANEOUS STENT INTERVENTION;  Surgeon: Angelia Mould, MD;  Location: [facility] CATH LAB;  Service: Cardiovascular;  Laterality: N/A;  . PERIPHERAL VASCULAR CATHETERIZATION N/A 03/23/2015   Procedure: Abdominal Aortogram;  Surgeon: Angelia Mould, MD;  Location: [place] CV LAB;  Service: Cardiovascular;  Laterality: N/A;  . STENTS     PLACED IN ??BOTH LEGS   2013?    Social History:   Social History   Social History  . Marital status: Married    Spouse name: N/A  . Number of children: N/A  . Years of education: N/A   Occupational History  . Not on file.   Social History Main Topics  . Smoking status: Former Smoker    Packs/day: 0.00    Years: 0.00  . Smokeless tobacco: Never Used     Comment: quit smoking  4 yrs ago  . Alcohol use 0.0 oz/week     Comment: beer- occasionally  . Drug use: No  . Sexual activity: No   Other Topics Concern  . Not on file   Social History Narrative  . No narrative on file    Allergies   Cialis [tadalafil]  Family history:   Family History  Problem Relation Age of Onset  . Diabetes Mother   . Hypertension Mother   . Heart disease Mother     Coronary Artery Bypass Graft  . Hyperlipidemia Mother   . Heart attack Mother   . Hypertension Father   . Hyperlipidemia Sister   . Stroke Sister     Current Medications:   Prior to Admission medications   Medication Sig Start Date End Date Taking? Authorizing Provider  aspirin EC 81 MG tablet Take 81 mg by mouth daily.   Yes Historical Provider, MD  cilostazol (PLETAL) 100 MG tablet Take 100 mg by mouth 2 (two) times daily.    Yes Historical Provider, MD  Doxylamine-Phenylephrine-APAP (SINUS & CONGESTION DAY/NIGHT PO) Take 1 tablet by mouth daily as needed (allergies).   Yes Historical Provider, MD  emollient (  BIAFINE) cream Apply topically 2 (two) times daily.   Yes Historical Provider, MD  fenofibrate 160 MG tablet Take 160 mg by mouth daily.   Yes Historical Provider, MD  glimepiride (AMARYL) 4 MG tablet Take 4 mg by mouth daily with breakfast.   Yes Historical Provider, MD  lidocaine-prilocaine (EMLA) cream Apply 1 application topically as needed. 09/18/16  Yes Curt Bears, MD  metFORMIN (GLUCOPHAGE) 500 MG tablet Take 1 tablet (500 mg total) by mouth 3 (three) times daily. 04/18/14  Yes [name], DO  metoprolol succinate (TOPROL-XL) 50 MG 24 hr tablet Take 50 mg by mouth daily. Take with or immediately following a meal.   Yes Historical Provider, MD  pioglitazone (ACTOS) 15 MG tablet Take 15 mg by mouth daily.   Yes Historical Provider, MD  prochlorperazine (COMPAZINE) 10 MG tablet Take 1 tablet (10 mg total) by mouth every 6 (six) hours as needed for nausea or vomiting. 09/18/16  Yes Curt Bears, MD  rosuvastatin (CRESTOR) 40 MG tablet Take 40 mg by mouth every evening. For cholesterol   Yes Historical Provider, MD  zolpidem (AMBIEN) 10 MG tablet Take 10 mg by mouth at bedtime as needed for sleep.  01/26/15  Yes Historical Provider, MD  Blood Glucose Monitoring Suppl (ACCU-CHEK NANO SMARTVIEW) w/Device KIT by Does not apply route.    Historical Provider, MD  HYDROcodone-acetaminophen (NORCO/VICODIN) 5-325 MG tablet Take 1 tablet by mouth every 4 (four) hours as needed for moderate pain. Patient not taking: Reported on 10/14/2016 09/21/16   Jola Schmidt, MD    Physical Exam:   Vitals:   10/21/16 1351 10/21/16 1400 10/21/16 1410 10/21/16 1412  BP:  137/79    Pulse: (!) 145 74 (!) 152 (!) 139  Resp: (!) 22 (!) 22 (!) 23 (!) 23  Temp:      TempSrc:      SpO2: 93% 93% 96% 94%  Weight:      Height:         Physical Exam: Blood pressure 137/79, pulse (!) 139, temperature 98.2 F (36.8 C), temperature source Oral, resp. rate (!) 23, height 6' 1" (1.854 m), weight 99.8 kg (220 lb), SpO2 94 %. Gen: No acute distress. Head: Normocephalic, atraumatic. Eyes: Pupils equal, round and reactive to light. Extraocular movements intact.  Sclerae nonicteric. No lid lag. Mouth: Oropharynx reveals Moist mucous membranes. Dentition is fair with dentures to the upper palate. Neck: Supple, no thyromegaly, no lymphadenopathy, no jugular venous distention. Chest: Lungs are mildly course with an occasional rhonchus.  CV: Heart sounds are irregularly irregular and tachycardic.  Abdomen: Soft, nontender, nondistended with normal active bowel sounds. No hepatosplenomegaly or palpable masses. Extremities: Extremities are without clubbing, or cyanosis. No edema. Pedal pulses 2+.  Skin: Warm and dry. No rashes, lesions or wounds. Neuro: Alert and oriented times 3; grossly nonfocal.  Psych: Insight is good and judgment is appropriate. Mood and affect normal.   Data Review:    Labs: Basic  Metabolic Panel:  Recent Labs Lab 10/21/16 1308  NA 134*  K 3.8  CL 103  CO2 25  GLUCOSE 160*  BUN 17  CREATININE 0.89  CALCIUM 9.1  MG 1.8   Liver Function Tests: No results for input(s): AST, ALT, ALKPHOS, BILITOT, PROT, ALBUMIN in the last 168 hours. No results for input(s): LIPASE, AMYLASE in the last 168 hours. No results for input(s): AMMONIA in the last 168 hours. CBC:  Recent Labs Lab 10/21/16 1308  WBC 2.3*  NEUTROABS 1.6*  HGB 8.7*  HCT 25.4*  MCV 87.3  PLT 117*   Cardiac Enzymes:  Recent Labs Lab 10/21/16 1308  TROPONINI <0.03    BNP (last 3 results) No results for input(s): PROBNP in the last 8760 hours. CBG: No results for input(s): GLUCAP in the last 168 hours.  Urinalysis    Component Value Date/Time   COLORURINE YELLOW 05/15/2015 0900   APPEARANCEUR CLEAR 05/15/2015 0900   LABSPEC 1.027 05/15/2015 0900   PHURINE 5.0 05/15/2015 0900   GLUCOSEU 500 (A) 05/15/2015 0900   HGBUR NEGATIVE 05/15/2015 0900   BILIRUBINUR SMALL (A) 05/15/2015 0900   KETONESUR NEGATIVE 05/15/2015 0900   PROTEINUR NEGATIVE 05/15/2015 0900   UROBILINOGEN 0.2 05/15/2015 0900   NITRITE NEGATIVE 05/15/2015 0900   LEUKOCYTESUR NEGATIVE 05/15/2015 0900      Radiographic Studies: Dg Chest Portable 1 View  Result Date: 10/21/2016 CLINICAL DATA:  Left-sided lung carcinoma.  Atrial fibrillation. EXAM: PORTABLE CHEST 1 VIEW COMPARISON:  September 21, 2016 FINDINGS: Port-A-Cath tip is in the superior vena cava. No pneumothorax. There is again noted a mass in the left upper lobe which appears smaller compared to 1 month prior. There is volume loss in the left upper lobe. Lungs elsewhere clear. Heart is upper normal in size with pulmonary vascularity within normal limits. There is calcification along the left hemidiaphragm. No evident adenopathy. No bone lesions. IMPRESSION: Left upper lobe mass appears somewhat smaller and less well-defined compared to 1 month prior. Lungs  elsewhere clear. Heart upper normal in size. Port-A-Cath tip in superior vena cava without pneumothorax. Calcification along left hemidiaphragm, likely due to previous asbestos exposure. Electronically Signed   By: Lowella Grip III M.D.   On: 10/21/2016 12:04    EKG: Independently reviewed. Atrial fibrillation at 140 bpm.   Assessment/Plan:   Principal Problem:   Atrial fibrillation with RVR (HCC) Admit to the step down unit and placed on a Cardizem drip with titration to achieve better heart rate control. Cycle cardiac markers. The patient's last 2-D echocardiogram was done 04/2014 at which time he had an EF of 55-60 percent with grade 1 diastolic dysfunction. Suspect lung pathology as the trigger for his atrial fibrillation, and do not feel that the 2-D echo needs to be repeated at this time unless he has elevated troponins. He has no cardiomegaly on chest x-ray, which was personally reviewed and is as pictured below. He does have a chads 2 vas score of 5 and should be on therapeutic anticoagulation. Case discussed with Dr. Julien Nordmann who agrees and would defer choice of agent to the cardiologist. I have requested a nonurgent cardiology consultation. Continue metoprolol. We'll place on therapeutic dose Lovenox for now. Discontinue cough syrup containing decongestants in case this has triggered a sympathomimetic response.  Active Problems:   Essential hypertension, benign Continue metoprolol with Cardizem drip.    Hyperlipidemia Continue fenofibrate and Crestor. Check FLP in the morning.    Diabetes mellitus, type 2 (HCC) Continue Actos and metformin. Check hemoglobin A1c. Monitor glucoses to ensure his glycemic control is optimized.    History of stroke Patient is already on aspirin, statin, and diabetic medications for risk factor modification.    COPD (chronic obstructive pulmonary disease) ([place]) Well-controlled currently.    Small cell lung carcinoma, left ([place]) Spoke with Dr.  Julien Nordmann about the patient's admission. Continue radiation therapy.  HIV screening The patient falls between the ages of 13-64 and should be screened for HIV, therefore HIV testing ordered.    Overweight Body  mass index is 29.03 kg/m.  Other information:   DVT prophylaxis: Lovenox ordered. Code Status: Full code. Family Communication: Wife at the bedside. Disposition Plan: Home 10/22/16 if heart rate controlled. Consults called: Cardiology consult placed nonurgently through [place]. (Email sent to [employer] [facility]). Admission status: Observation.  The medical decision making on this patient was of high complexity and the patient is at high risk for clinical deterioration, therefore this is a level 3 visit.   Jeryn Cerney [facility] Pager [phone] Cell: [phone]   If 7PM-7AM, please contact night-coverage [email or web address] Password [facility] 10/21/2016, 3:55 PM

## 2016-10-21 NOTE — Progress Notes (Signed)
Patient placed on monitor and was noted to be in atrial fibrillation with rate from 146-166.

## 2016-10-22 ENCOUNTER — Observation Stay (HOSPITAL_BASED_OUTPATIENT_CLINIC_OR_DEPARTMENT_OTHER): Payer: BLUE CROSS/BLUE SHIELD

## 2016-10-22 ENCOUNTER — Telehealth: Payer: Self-pay | Admitting: Oncology

## 2016-10-22 ENCOUNTER — Ambulatory Visit
Admission: RE | Admit: 2016-10-22 | Discharge: 2016-10-22 | Disposition: A | Discharge status: Home / Self Care | Payer: BLUE CROSS/BLUE SHIELD | Source: Ambulatory Visit | Attending: Radiation Oncology | Admitting: Radiation Oncology

## 2016-10-22 DIAGNOSIS — E1159 Type 2 diabetes mellitus with other circulatory complications: Secondary | ICD-10-CM

## 2016-10-22 DIAGNOSIS — D649 Anemia, unspecified: Secondary | ICD-10-CM

## 2016-10-22 DIAGNOSIS — T451X5A Adverse effect of antineoplastic and immunosuppressive drugs, initial encounter: Secondary | ICD-10-CM

## 2016-10-22 DIAGNOSIS — C3490 Malignant neoplasm of unspecified part of unspecified bronchus or lung: Secondary | ICD-10-CM

## 2016-10-22 DIAGNOSIS — D61810 Antineoplastic chemotherapy induced pancytopenia: Secondary | ICD-10-CM | POA: Diagnosis not present

## 2016-10-22 DIAGNOSIS — I10 Essential (primary) hypertension: Secondary | ICD-10-CM | POA: Diagnosis not present

## 2016-10-22 DIAGNOSIS — E785 Hyperlipidemia, unspecified: Secondary | ICD-10-CM | POA: Diagnosis not present

## 2016-10-22 DIAGNOSIS — I4891 Unspecified atrial fibrillation: Secondary | ICD-10-CM

## 2016-10-22 DIAGNOSIS — Z8673 Personal history of transient ischemic attack (TIA), and cerebral infarction without residual deficits: Secondary | ICD-10-CM | POA: Diagnosis not present

## 2016-10-22 DIAGNOSIS — Z510 Encounter for antineoplastic radiation therapy: Secondary | ICD-10-CM | POA: Diagnosis not present

## 2016-10-22 LAB — CBC WITH DIFFERENTIAL/PLATELET
BASOS PCT: 0 %
BASOS PCT: 1 %
Basophils Absolute: 0 10*3/uL (ref 0.0–0.1)
Basophils Absolute: 0 10*3/uL (ref 0.0–0.1)
EOS ABS: 0 10*3/uL (ref 0.0–0.7)
EOS PCT: 0 %
Eosinophils Absolute: 0 10*3/uL (ref 0.0–0.7)
Eosinophils Relative: 1 %
HCT: 25.4 % — ABNORMAL LOW (ref 39.0–52.0)
HEMATOCRIT: 23.8 % — AB (ref 39.0–52.0)
Hemoglobin: 8.7 g/dL — ABNORMAL LOW (ref 13.0–17.0)
Hemoglobin: 8.3 g/dL — ABNORMAL LOW (ref 13.0–17.0)
LYMPHS ABS: 0.6 10*3/uL — AB (ref 0.7–4.0)
Lymphocytes Relative: 26 %
Lymphocytes Relative: 28 %
Lymphs Abs: 0.4 10*3/uL — ABNORMAL LOW (ref 0.7–4.0)
MCH: 29.9 pg (ref 26.0–34.0)
MCH: 31.3 pg (ref 26.0–34.0)
MCHC: 34.3 g/dL (ref 30.0–36.0)
MCHC: 34.9 g/dL (ref 30.0–36.0)
MCV: 87.3 fL (ref 78.0–100.0)
MCV: 89.8 fL (ref 78.0–100.0)
MONO ABS: 0.1 10*3/uL (ref 0.1–1.0)
MONOS PCT: 8 %
Monocytes Absolute: 0.1 10*3/uL (ref 0.1–1.0)
Monocytes Relative: 5 %
NEUTROS ABS: 1.6 10*3/uL — AB (ref 1.7–7.7)
NEUTROS ABS: 1 10*3/uL — AB (ref 1.7–7.7)
NEUTROS PCT: 63 %
Neutrophils Relative %: 69 %
PLATELETS: 117 10*3/uL — AB (ref 150–400)
Platelets: 92 10*3/uL — ABNORMAL LOW (ref 150–400)
RBC: 2.91 MIL/uL — AB (ref 4.22–5.81)
RBC: 2.65 MIL/uL — AB (ref 4.22–5.81)
RDW: 13.1 % (ref 11.5–15.5)
RDW: 13.3 % (ref 11.5–15.5)
WBC: 2.3 10*3/uL — AB (ref 4.0–10.5)
WBC: 1.6 10*3/uL — AB (ref 4.0–10.5)

## 2016-10-22 LAB — GLUCOSE, CAPILLARY
GLUCOSE-CAPILLARY: 160 mg/dL — AB (ref 65–99)
GLUCOSE-CAPILLARY: 189 mg/dL — AB (ref 65–99)
GLUCOSE-CAPILLARY: 138 mg/dL — AB (ref 65–99)

## 2016-10-22 LAB — BASIC METABOLIC PANEL
ANION GAP: 9 (ref 5–15)
BUN: 15 mg/dL (ref 6–20)
CHLORIDE: 101 mmol/L (ref 101–111)
CO2: 24 mmol/L (ref 22–32)
CREATININE: 0.86 mg/dL (ref 0.61–1.24)
Calcium: 8.9 mg/dL (ref 8.9–10.3)
GFR calc non Af Amer: >60 mL/min (ref >60)
Glucose, Bld: 216 mg/dL — ABNORMAL HIGH (ref 65–99)
POTASSIUM: 3.7 mmol/L (ref 3.5–5.1)
SODIUM: 134 mmol/L — AB (ref 135–145)

## 2016-10-22 LAB — TROPONIN I
Troponin I: <0.03 ng/mL (ref <0.03)
Troponin I: <0.03 ng/mL (ref <0.03)

## 2016-10-22 LAB — MAGNESIUM: MAGNESIUM: 1.8 mg/dL (ref 1.7–2.4)

## 2016-10-22 LAB — PROTIME-INR
INR: 1.1
Prothrombin Time: 14.3 seconds (ref 11.4–15.2)

## 2016-10-22 LAB — ECHOCARDIOGRAM COMPLETE
HEIGHTINCHES: 73 in
WEIGHTICAEL: 3442.7 [oz_av]

## 2016-10-22 LAB — LIPID PANEL
CHOL/HDL RATIO: 5 ratio
Cholesterol: 121 mg/dL (ref 0–200)
HDL: 24 mg/dL — ABNORMAL LOW (ref >40)
LDL Cholesterol: 53 mg/dL (ref 0–99)
Triglycerides: 219 mg/dL — ABNORMAL HIGH (ref <150)
VLDL: 44 mg/dL — AB (ref 0–40)

## 2016-10-22 LAB — APTT: aPTT: 30 seconds (ref 24–36)

## 2016-10-22 MED ORDER — DILTIAZEM HCL ER COATED BEADS 120 MG PO CP24
120.0000 mg | ORAL_CAPSULE | Freq: Every day | ORAL | Status: DC
  Administered 2016-10-22: 120 mg via ORAL
  Filled 2016-10-22: qty 1

## 2016-10-22 MED ORDER — INSULIN ASPART 100 UNIT/ML ~~LOC~~ SOLN
0.0000 [IU] | Freq: Three times a day (TID) | SUBCUTANEOUS | Status: DC
  Administered 2016-10-22: 3 [IU] via SUBCUTANEOUS

## 2016-10-22 MED ORDER — DILTIAZEM HCL 30 MG PO TABS
30.0000 mg | ORAL_TABLET | Freq: Four times a day (QID) | ORAL | Status: DC
  Administered 2016-10-22 (×3): 30 mg via ORAL
  Filled 2016-10-22 (×3): qty 1

## 2016-10-22 MED ORDER — HEPARIN SOD (PORK) LOCK FLUSH 100 UNIT/ML IV SOLN: 500.0000 [IU] | INTRAVENOUS | Status: DC | PRN

## 2016-10-22 MED ORDER — APIXABAN 5 MG PO TABS: 5.0000 mg | ORAL_TABLET | Freq: Two times a day (BID) | ORAL | Status: DC

## 2016-10-22 MED ORDER — APIXABAN 5 MG PO TABS: 5.0000 mg | ORAL_TABLET | Freq: Two times a day (BID) | ORAL | 3 refills | Status: DC

## 2016-10-22 MED ORDER — DILTIAZEM HCL ER COATED BEADS 120 MG PO CP24: 120.0000 mg | ORAL_CAPSULE | Freq: Every day | ORAL | 2 refills | Status: DC

## 2016-10-22 NOTE — Consult Note (Addendum)
Cardiology Consult    Patient ID: Jesse Avila MRN: [id number], DOB/AGE: [birth year]   Admit date: 10/21/2016 Date of Consult: 10/22/2016  Primary Physician: Jesse Huh, MD Primary Cardiologist: Jesse Polite88) Requesting Provider: Rama Reason for Consultation: Afib  Patient Profile    66 yo male with PMH of HTN, HL, carotid artery stenosis, hx of tobacco use, CVA, PVD and small cell lung Ca who presented with rapid HR and dizziness. Found to have new onset Afib RVR by his oncologist at appt.   Past Medical History   Past Medical History:  Diagnosis Date  . Carotid artery occlusion   . DJD (degenerative joint disease) of knee   . Essential hypertension, benign    takes Benicar daily  . Goals of care, counseling/discussion 09/18/2016  . History of colon polyps    benign  . History of gout   . Hyperlipidemia    takes Fenofibrate and Crestor daily  . Insomnia    takes Ambien nightly as needed  . Joint pain   . Lung cancer ([place])   . Nocturia   . Peripheral vascular disease ([place])   . Primary localized osteoarthritis of left knee 12/26/2014  . Stroke [facility]) 2015?   takes Plavix daily as well as Pletal,not on plavix at present 05/15/15  . 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 ABDOMINAL AORTAGRAM N/A 03/29/2012   Procedure: ABDOMINAL Maxcine Ham;  Surgeon: Angelia Mould, MD;  Location: [facility] CATH LAB;  Service: Cardiovascular;  Laterality: N/A;  . COLONOSCOPY    . ENDARTERECTOMY Right 05/09/2014   Procedure: RIGHT CAROTID ENDARTERECTOMY WITH PATCH ANGIOPLASTY;  Surgeon: Jesse Munjor, MD;  Location: [place];  Service: Vascular;  Laterality: Right;  . ENDARTERECTOMY FEMORAL Right 05/17/2015   Procedure: ENDARTERECTOMY FEMORAL;  Surgeon: Angelia Mould, MD;  Location: [place];  Service: Vascular;   Laterality: Right;  . ENDOBRONCHIAL ULTRASOUND Bilateral 09/08/2016   Procedure: ENDOBRONCHIAL ULTRASOUND;  Surgeon: Jesse Noel, MD;  Location: [facility] ENDOSCOPY;  Service: Cardiopulmonary;  Laterality: Bilateral;  . FEMORAL-POPLITEAL BYPASS GRAFT Right 05/17/2015   Procedure: BYPASS GRAFT FEMORAL-BELOW KNEE POPLITEAL ARTERY, using gortex propaten graft 6 mm x 80 cm;  Surgeon: Angelia Mould, MD;  Location: [place];  Service: Vascular;  Laterality: Right;  . IR GENERIC HISTORICAL  09/25/2016   IR FLUORO GUIDE PORT INSERTION RIGHT 09/25/2016 Jesse Daft, MD [facility]-INTERV RAD  . IR GENERIC HISTORICAL  09/25/2016   IR US GUIDE VASC ACCESS RIGHT 09/25/2016 Jesse Daft, MD [facility]-INTER[facility] RAD  . KNEE ARTHROSCOPY Right 11/2009  . LOWER EXTREMITY ANGIOGRAM Bilateral 03/23/2015   Procedure: Lower Extremity Angiogram;  Surgeon: Angelia Mould, MD;  Location: [place] CV LAB;  Service: Cardiovascular;  Laterality: Bilateral;  . MENISCUS REPAIR  11/2009  . PARTIAL KNEE ARTHROPLASTY Left 12/26/2014   Procedure: UNICOMPARTMENTAL KNEE;  Surgeon: Marchia Bond, MD;  Location: [place];  Service: Orthopedics;  Laterality: Left;  . PATCH ANGIOPLASTY Right 05/17/2015   Procedure: VEIN PATCH ANGIOPLASTY ILEOFEMORAL ARTERY;  Surgeon: Angelia Mould, MD;  Location: [place];  Service: Vascular;  Laterality: Right;  . PERCUTANEOUS STENT INTERVENTION N/A 05/10/2012   Procedure: PERCUTANEOUS STENT INTERVENTION;  Surgeon: Angelia Mould, MD;  Location: [facility] CATH LAB;  Service: Cardiovascular;  Laterality: N/A;  . PERIPHERAL VASCULAR CATHETERIZATION N/A 03/23/2015   Procedure: Abdominal Aortogram;  Surgeon: Angelia Mould, MD;  Location: [place] CV LAB;  Service: Cardiovascular;  Laterality: N/A;  . STENTS     PLACED IN ??BOTH LEGS   2013?     Allergies  Allergies  Allergen Reactions  . Cialis [Tadalafil] Other (See Comments)    Reaction:  Headache     History of Present Illness    Jesse Avila is a 66 yo  male with PMH of HTN, HL, carotid artery stenosis, hx of tobacco use, CVA, PVD and small cell lung Ca. Reports he stopped smoking about 9 years ago. Was seen remotely in the past by Jesse Avila for preop evaluation for elective right CEA. Had normal stress test and echo that showed normal EF with no WMA and G1DD. Currently followed by his PCP for chronic medical issues. Was seen by his PCP back in 1/18 for hoarseness and had a CXR with follow up CT that showed 7.2 x 6.9 x 6.6 cm irregular mass in the aorticopulmonary window of the mediastinum concerning for malignancy. Underwent video bronchoscopy/biospy and PET scan. Currently following with Jesse Avila for chemotherapy and radiation. Was seen in the office by Jesse Avila on 10/21/16 who noted that Jesse Avila pulse was irregular and rapid. After being questioned he reported a 3 day hx of dizziness, along with generalized weakness. He was referred to the [facility] ED for further work up.   In the ED his EKG showed AFib RVR. Labs showed stable electrolytes, trop neg x3, WBC 2.3, Hgb 8.7, platelet 117,000, TSH 0.234, Free T4 1.25. He was started on Dilt drip for rate control and did convert to SR with 1st degree AVB. He was admitted by [facility] for further work up. Cardiology has been asked to see the patient in regards to anticoagulation.   Inpatient Medications    . aspirin EC  81 mg Oral Daily  . cilostazol  100 mg Oral BID  . diltiazem  30 mg Oral Q6H  . enoxaparin (LOVENOX) injection  1 mg/kg Subcutaneous Q12H  . fenofibrate  160 mg Oral Daily  . insulin aspart  0-15 Units Subcutaneous TID WC  . metoprolol succinate  50 mg Oral Daily  . rosuvastatin  40 mg Oral QPM  . sodium chloride flush  3 mL Intravenous Q12H  . SONAFINE  1 application Topical BID    Family History    Family History  Problem Relation Age of Onset  . Diabetes Mother   . Hypertension Mother   . Heart disease Mother     Coronary Artery Bypass Graft  . Hyperlipidemia Mother   . Heart  attack Mother   . Hypertension Father   . Hyperlipidemia Sister   . Stroke Sister     Social History    Social History   Social History  . Marital status: Married    Spouse name: N/A  . Number of children: N/A  . Years of education: N/A   Occupational History  . Not on file.   Social History Main Topics  . Smoking status: Former Smoker    Packs/day: 0.00    Years: 0.00  . Smokeless tobacco: Never Used     Comment: quit smoking 4 yrs ago  . Alcohol use 0.0 oz/week     Comment: beer- occasionally  . Drug use: No  . Sexual activity: No   Other Topics Concern  . Not on file   Social History Narrative  . No narrative on file     Review of Systems  General:  No chills, fever, night sweats or weight changes.  Cardiovascular:  No chest pain, dyspnea on exertion, edema, orthopnea, palpitations, paroxysmal nocturnal dyspnea. Dermatological: No rash, lesions/masses Respiratory: No cough, dyspnea Urologic: No hematuria, dysuria Abdominal:   No nausea, vomiting, diarrhea, bright red blood per rectum, melena, or hematemesis Neurologic:  No visual changes, wkns, changes in mental status, ++ dizziness All other systems reviewed and are otherwise negative except as noted above.  Physical Exam    Blood pressure 125/61, pulse 86, temperature 98 F (36.7 C), temperature source Oral, resp. rate (!) 21, height '6\' 1"'$  (1.854 m), weight 215 lb 2.7 oz (97.6 kg), SpO2 96 %.  General: Pleasant older WM, NAD Psych: Normal affect. Neuro: Alert and oriented X 3. Moves all extremities spontaneously. HEENT: Normal  Neck: Supple without bruits or JVD. Lungs:  Resp regular and unlabored, CTA. Heart: RRR no s3, s4, or murmurs. Abdomen: Soft, non-tender, non-distended, BS + x 4.  Extremities: No clubbing, cyanosis or edema. DP/PT/Radials 2+ and equal bilaterally.  Labs    Troponin (Point of Care Test) No results for input(s): TROPIPOC in the last 72 hours.  Recent Labs   10/21/16 1308 10/21/16 1848 10/21/16 2342 10/22/16 0616  TROPONINI <0.03 <0.03 <0.03 <0.03   Lab Results  Component Value Date   WBC 2.3 (L) 10/21/2016   HGB 8.7 (L) 10/21/2016   HCT 25.4 (L) 10/21/2016   MCV 87.3 10/21/2016   PLT 117 (L) 10/21/2016    Recent Labs Lab 10/21/16 1308  NA 134*  K 3.8  CL 103  CO2 25  BUN 17  CREATININE 0.89  CALCIUM 9.1  GLUCOSE 160*   Lab Results  Component Value Date   CHOL 147 04/15/2014   HDL 29 (L) 04/15/2014   LDLCALC 66 04/15/2014   TRIG 261 (H) 04/15/2014   No results found for: [facility]   Radiology Studies   Dg Chest Portable 1 View  Result Date: 10/21/2016 CLINICAL DATA:  Left-sided lung carcinoma.  Atrial fibrillation. EXAM: PORTABLE CHEST 1 VIEW COMPARISON:  September 21, 2016 FINDINGS: Port-A-Cath tip is in the superior vena cava. No pneumothorax. There is again noted a mass in the left upper lobe which appears smaller compared to 1 month prior. There is volume loss in the left upper lobe. Lungs elsewhere clear. Heart is upper normal in size with pulmonary vascularity within normal limits. There is calcification along the left hemidiaphragm. No evident adenopathy. No bone lesions. IMPRESSION: Left upper lobe mass appears somewhat smaller and less well-defined compared to 1 month prior. Lungs elsewhere clear. Heart upper normal in size. Port-A-Cath tip in superior vena cava without pneumothorax. Calcification along left hemidiaphragm, likely due to previous asbestos exposure. Electronically Signed   By: Lowella Grip III M.D.   On: 10/21/2016 12:04   ECG & Cardiac Imaging    EKG: AF RVR, with conversion to SR with 1st degree AVB.   Echo: 9/15  Study Conclusions  - Left ventricle: The cavity size was normal. Wall thickness was normal. Systolic function was normal. The estimated ejection fraction was in the range of 55% to 60%. Wall motion was normal; there were no regional wall motion abnormalities.  Doppler parameters are consistent with abnormal left ventricular relaxation (grade 1 diastolic dysfunction). - Left atrium: The atrium was mildly dilated.  Impressions:  - No cardiac source of emboli was indentified.  Assessment & Plan    66 yo male with PMH of HTN, HL, carotid artery stenosis, hx of tobacco  use, CVA, PVD and small cell lung Ca who presented with rapid HR and dizziness. Found to have new onset Afib RVR.  1. AFib RVR: New onset this admission. Reports feeling dizzy over the past couple of days, but thought this was related to his sinuses. EKG on admission showed AFib RVR. Placed on IV dilt then converted to SR. Has been transitioned to oral dilt now. HR sustained SR. Would transition to Dilt '120mg'$  in the am.  -- This patients CHA2DS2-VASc Score and unadjusted Ischemic Stroke Rate (% per year) is equal to 7.2 % stroke rate/year from a score of 5 CHF, HTN, Vascular=MI/PAD/Aortic Plaque, Stroke/TIA/TE -- Will discuss with MD regarding options for [facility] as he is currently undergoing chemotherapy, Hgb 8.7, and platelet 117,000.  -- echo pending, last one from 2015 showed normal EF with G1DD.   2. Small cell lung Ca: Followed by Jesse Avila. Undergoing chemo/radiation.  3. HTN: Controlled.   4. HL: On statin and fenofibrate  5. PVD: Currently on Pletal and '81mg'$  ASA. Will likely need to stop ASA once decision made about [place].   Barnet Pall, NP-C Pager [phone] 10/22/2016, 9:00 AM   I have examined the patient and reviewed assessment and plan and discussed with patient.  Agree with above as stated.  Patient back in NSR.  Would recommend Eliquis for stroke prevention if this is ok with oncology, from a bleeding standpoint.  If he starts Eliquis, would stop aspirin and Pletal.  Will check with Dr. Earlie Server.  He wants cardiology f/u in [place] office.  He will need echo also.  THis can be done as an outpatient. He would like to go home today.  WIll change his  Diltiazem to a long acting formulation.  He can take both the metoprolol and dilt for now.  We spoke about the risks of AFib and stroke.    Larae Grooms   Addendum:  I personally reviewed the echo from today and there does not appear to be any severe structural heart disease.  THe full report will follow. Spoke to Dr. [place] Silos and oncology was agreeable to anticoagulation for this patient.  He wishes to f/u in [place].  Jettie Booze, MD

## 2016-10-22 NOTE — Progress Notes (Addendum)
[place] for Lovenox to apixaban Indication: atrial fibrillation  Allergies  Allergen Reactions  . Cialis [Tadalafil] Other (See Comments)    Reaction:  Headache     Patient Measurements: Height: '6\' 1"'$  (185.4 cm) Weight: 215 lb 2.7 oz (97.6 kg) IBW/kg (Calculated) : 79.9  Vital Signs: Temp: 98.2 F (36.8 C) (03/21 1200) Temp Source: Oral (03/21 1200) BP: 118/60 (03/21 1200) Pulse Rate: 85 (03/21 1200)  Labs:  Recent Labs  10/21/16 1308 10/21/16 1848 10/21/16 2342 10/22/16 0616 10/22/16 0914  HGB 8.7*  --   --   --  8.3*  HCT 25.4*  --   --   --  23.8*  PLT 117*  --   --   --  92*  APTT  --   --  30  --   --   LABPROT  --   --  14.3  --   --   INR  --   --  1.10  --   --   CREATININE 0.89  --   --   --  0.86  TROPONINI <0.03 <0.03 <0.03 <0.03  --     Estimated Creatinine Clearance: 108.2 mL/min (by C-G formula based on SCr of 0.86 mg/dL).   Medical History: Past Medical History:  Diagnosis Date  . Carotid artery occlusion   . DJD (degenerative joint disease) of knee   . Essential hypertension, benign    takes Benicar daily  . Goals of care, counseling/discussion 09/18/2016  . History of colon polyps    benign  . History of gout   . Hyperlipidemia    takes Fenofibrate and Crestor daily  . Insomnia    takes Ambien nightly as needed  . Joint pain   . Lung cancer ([place])   . Nocturia   . Peripheral vascular disease ([place])   . Primary localized osteoarthritis of left knee 12/26/2014  . Stroke [facility]) 2015?   takes Plavix daily as well as Pletal,not on plavix at present 05/15/15  . 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 apixaban  5 mg Oral BID  . aspirin EC  81 mg Oral Daily  . cilostazol  100 mg Oral BID  . diltiazem  120 mg Oral Daily  . fenofibrate  160 mg Oral Daily  .  insulin aspart  0-15 Units Subcutaneous TID WC  . metoprolol succinate  50 mg Oral Daily  . rosuvastatin  40 mg Oral QPM  . sodium chloride flush  3 mL Intravenous Q12H  . SONAFINE  1 application Topical BID    Assessment:  66 yr male sent from cancer center to ED with new onset AFib with RVR.  PMH significant for lung cancer; patient undergoing chemo and radiation.  Patient on no oral anticoagulation PTA  Today, 10/22/2016  Orders to transition to apixaban  Last dose on enoxaparin at noon today  CBC: Hgb low/stable, pltc decreased and trending down following chemotherapy  Goal of Therapy:  Monitor platelets by anticoagulation protocol: Yes   Plan:   Apixaban '5mg'$  PO BID  1st dose at 22:00 based on enoxaparin administration time  Follow renal function, CBC  Follow plans for ASA and pletal.  Cards note suggest stopping both  Doreene Eland, PharmD, BCPS.   Pager: [phone] 10/22/2016 2:32 PM

## 2016-10-22 NOTE — Telephone Encounter (Signed)
Marcy Siren, RN, patient's nurse in ICU stepdown.  Per Zoe, patient is not on a Cardizem gtt and is ok to have radiation today.  Notified Melanie, RTT on [place] 4.

## 2016-10-22 NOTE — Progress Notes (Signed)
  Echocardiogram 2D Echocardiogram has been performed.  Jesse Avila 10/22/2016, 2:38 PM

## 2016-10-22 NOTE — Progress Notes (Signed)
PROGRESS NOTE    Jesse Avila  QAS:[id number] DOB: 1950/10/20 DOA: 10/21/2016 PCP: Simona Huh, MD    Brief Narrative:  Jesse Avila is an 66 y.o. 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 2X6.9 at 6.6 cm irregular mass in the aorticopulmonary window of the mediastinum concerning for malignancy. He subsequently underwent a video bronchoscopy/biopsy. PET scan was negative for hypermetabolic adenopathy in the chest. The patient is currently undergoing concurrent chemotherapy (etoposide and carboplatin) and radiation therapy, and saw Dr. Sondra Come today who noted that his pulse was irregular and rapid. The patient endorses a 3 day history of dizziness, but no chest pain or shortness of breath. He has never had palpitations. He is currently asymptomatic. Upon initial presentation in the ED, the patient was noted to be in atrial fibrillation with RVR.  ED Course:  The patient was placed on a Cardizem drip with a CHADSVASC score of 5. Twelve-lead EKG confirmed atrial fibrillation with RVR. TSH noted to be low at 0.234. Chest x-ray negative for heart failure or cardiomegaly. Initial troponin < 0.03.   Assessment & Plan:   Principal Problem:   Atrial fibrillation (HCC) Active Problems:   Essential hypertension, benign   Hyperlipidemia   Diabetes mellitus, type 2 (HCC)   Obesity   History of stroke   COPD (chronic obstructive pulmonary disease) (HCC)   Small cell lung carcinoma, left (HCC)   Atrial fibrillation with RVR (HCC)   Malignant neoplasm of lung (HCC)   Antineoplastic chemotherapy induced pancytopenia ([place])  #1 new onset  atrial fibrillation CHA2DS2VASC2 = 5 Questionable etiology. Likely secondary to lung pathology with small cell lung cancer currently undergoing chemotherapy. Cardiac enzymes negative 3. TSH low at 0.234 with a free T4 of 1.25. Will likely need repeat thyroid function studies done in about 4-6 weeks. Check a 2-D echo. Patient currently in normal sinus rhythm and currently off the Cardizem drip. Continue oral Cardizem 30 mg every 6 hours. Patient currently on full dose Lovenox for anticoagulation while awaiting cardiology recommendations in terms of anticoagulation. Concern also noted for patient's thrombocytopenia. Patient also noted to be on aspirin and Pletal prior to admission.  #2 hypertension Continue metoprolol and oral Cardizem.  #3 hyperlipidemia Fasting lipid panel with a total cholesterol 121, LDL of 53, HDL of 24. Triglycerides of 219. Continue fenofibrateand Crestor.  #4 COPD Stable.  #5 diabetes mellitus type 2 Hemoglobin A1c pending. Discontinue oral hypoglycemic agents. Place on sliding scale insulin.  #6 history of stroke Stable. Continue aspirin for secondary stroke prevention. Risk factor modification.  #7 small cell lung cancer Dr. Earlie Server notified of patient's admission. Continue radiation therapy.  #8 antineoplastic chemotherapy-induced pancytopenia Patient denies any overt bleeding. No signs of infection. Patient's white count currently at 1.6 with ANC of 1.0. Platelet count now at 92 from 117 on admission. Hemoglobin at 8.3. Will follow for now. Once ANC is less than 1 may need a dose of IV granix. Follow.    DVT prophylaxis: Lovenox. Code Status: full Family Communication: updated patient and wife at bedside. Disposition Plan: transfer to telemetry later on this afternoon if heart rate remains controlled and  in sinus rhythm. Likely home tomorrow if remains stable workup completed and per cardiology.   Consultants:   Cardiology 10/22/2016  Procedures:    2-D echo pending  Chest x-ray 10/21/2016    Antimicrobials:   none   Subjective: Patient denies any chest pain. No shortness of breath. No dizziness. No syncopal episode. No bleeding. Patient concerned about his hospital stay in terms of bills. Patient currently off Cardizem drip and has converted to normal sinus rhythm overnight.  Objective: Vitals:   10/22/16 0600 10/22/16 0700 10/22/16 0705 10/22/16 0800  BP: 125/61     Pulse: 85 84 86   Resp: (!) 21 (!) 22 (!) 21   Temp:    97.6 F (36.4 C)  TempSrc:    Oral  SpO2: 96% 95% 96%   Weight:      Height:        Intake/Output Summary (Last 24 hours) at 10/22/16 1059 Last data filed at 10/22/16 0700  Gross per 24 hour  Intake           803.76 ml  Output              650 ml  Net           153.76 ml   Filed Weights   10/21/16 1317 10/22/16 0500  Weight: 99.8 kg (220 lb) 97.6 kg (215 lb 2.7 oz)    Examination:  General exam: Appears calm and comfortable  Respiratory system: Clear to auscultation. Respiratory effort normal. Cardiovascular system: S1 & S2 heard, RRR. No JVD, murmurs, rubs, gallops or clicks. No pedal edema. Gastrointestinal system: Abdomen is nondistended, soft and nontender. No organomegaly or masses felt. Normal bowel sounds heard. Central nervous system: Alert and oriented. No focal neurological deficits. Extremities: Symmetric 5 x 5 power. Skin: No rashes, lesions or ulcers Psychiatry: Judgement and insight appear normal. Mood & affect appropriate.     Data Reviewed: I have personally reviewed following labs and imaging studies  CBC:  Recent Labs Lab 10/21/16 1308 10/22/16 0914  WBC 2.3* 1.6*  NEUTROABS 1.6* 1.0*  HGB 8.7* 8.3*  HCT 25.4* 23.8*  MCV 87.3 89.8  PLT 117* 92*   Basic Metabolic Panel:  Recent Labs Lab 10/21/16 1308 10/22/16 0914  NA 134* 134*  K 3.8 3.7  CL 103 101  CO2 25 24  GLUCOSE 160* 216*  BUN 17 15  CREATININE 0.89 0.86  CALCIUM 9.1 8.9  MG 1.8 1.8    GFR: Estimated Creatinine Clearance: 108.2 mL/min (by C-G formula based on SCr of 0.86 mg/dL). Liver Function Tests: No results for input(s): AST, ALT, ALKPHOS, BILITOT, PROT, ALBUMIN in the last 168 hours. No results for input(s): LIPASE, AMYLASE in the last 168 hours. No results for input(s): AMMONIA in the last 168 hours. Coagulation Profile:  Recent Labs Lab 10/21/16 2342  INR 1.10   Cardiac Enzymes:  Recent Labs Lab 10/21/16 1308 10/21/16 1848 10/21/16 2342 10/22/16 0616  TROPONINI <0.03 <0.03 <0.03 <0.03   BNP (last 3 results) No results for input(s): PROBNP in the last 8760 hours. HbA1C: No results for input(s): HGBA1C in the last 72 hours. CBG:  Recent Labs Lab 10/21/16 1908 10/21/16 2240 10/22/16 0804  GLUCAP 162* 212* 160*   Lipid Profile:  Recent Labs  10/22/16 0616  CHOL 121  HDL 24*  LDLCALC 53  TRIG 219*  CHOLHDL 5.0   Thyroid Function Tests:  Recent Labs  10/21/16 1309 10/21/16 1848  TSH 0.234*  --  FREET4  --  1.25*   Anemia Panel: No results for input(s): VITAMINB12, FOLATE, FERRITIN, TIBC, IRON, RETICCTPCT in the last 72 hours. Sepsis Labs: No results for input(s): PROCALCITON, LATICACIDVEN in the last 168 hours.  Recent Results (from the past 240 hour(s))  MRSA PCR Screening     Status: None   Collection Time: 10/21/16 10:22 PM  Result Value Ref Range Status   MRSA by PCR NEGATIVE NEGATIVE Final    Comment:        The GeneXpert MRSA Assay (FDA approved for NASAL specimens only), is one component of a comprehensive MRSA colonization surveillance program. It is not intended to diagnose MRSA infection nor to guide or monitor treatment for MRSA infections.          Radiology Studies: Dg Chest Portable 1 View  Result Date: 10/21/2016 CLINICAL DATA:  Left-sided lung carcinoma.  Atrial fibrillation. EXAM: PORTABLE CHEST 1 VIEW COMPARISON:  September 21, 2016 FINDINGS: Port-A-Cath tip is in the superior vena cava. No  pneumothorax. There is again noted a mass in the left upper lobe which appears smaller compared to 1 month prior. There is volume loss in the left upper lobe. Lungs elsewhere clear. Heart is upper normal in size with pulmonary vascularity within normal limits. There is calcification along the left hemidiaphragm. No evident adenopathy. No bone lesions. IMPRESSION: Left upper lobe mass appears somewhat smaller and less well-defined compared to 1 month prior. Lungs elsewhere clear. Heart upper normal in size. Port-A-Cath tip in superior vena cava without pneumothorax. Calcification along left hemidiaphragm, likely due to previous asbestos exposure. Electronically Signed   By: Lowella Grip III M.D.   On: 10/21/2016 12:04        Scheduled Meds: . aspirin EC  81 mg Oral Daily  . cilostazol  100 mg Oral BID  . diltiazem  30 mg Oral Q6H  . enoxaparin (LOVENOX) injection  1 mg/kg Subcutaneous Q12H  . fenofibrate  160 mg Oral Daily  . insulin aspart  0-15 Units Subcutaneous TID WC  . metoprolol succinate  50 mg Oral Daily  . rosuvastatin  40 mg Oral QPM  . sodium chloride flush  3 mL Intravenous Q12H  . SONAFINE  1 application Topical BID   Continuous Infusions:   LOS: 0 days    Time spent: 40 minutes    Tiburcio Linder, MD [facility] Pager [phone]  If 7PM-7AM, please contact night-coverage [email or web address] Password [facility] 10/22/2016, 10:59 AM

## 2016-10-22 NOTE — Discharge Instructions (Signed)

## 2016-10-22 NOTE — Discharge Summary (Signed)
Physician Discharge Summary  Jesse Avila PYP:[id number] DOB: Apr 16, 1951 DOA: 10/21/2016  PCP: Simona Huh, MD  Admit date: 10/21/2016 Discharge date: 10/22/2016  Time spent: 65 minutes  Recommendations for Outpatient Follow-up:  1. Follow-up with Simona Huh, MD in 2 weeks. On follow-up patient will need a basic metabolic profile done to follow-up on electrolytes and renal function. Patient will need repeat thyroid function studies done in about 4-6 weeks. Patient's blood pressure also need to be reassessed. 2. Follow-up with Dr. Lorna Few as scheduled. Follow-up on patient's pancytopenia. 3. Follow-up with Dr. Rockey Situ, cardiology on 11/04/2016 for follow-up with new onset atrial fibrillation. Patient's formal reading of 2-D echo which was done on discharge will need to be followed up upon.   Discharge Diagnoses:  Principal Problem:   Atrial fibrillation ([place]) Active Problems:   Essential hypertension, benign   Hyperlipidemia   Diabetes mellitus, type 2 (HCC)   Obesity   History of stroke   COPD (chronic obstructive pulmonary disease) (HCC)   Small cell lung carcinoma, left (HCC)   Atrial fibrillation with RVR ([place])   Malignant neoplasm of lung (HCC)   Antineoplastic chemotherapy induced pancytopenia ([place])   Discharge Condition: Stable and improved  Diet recommendation: Heart healthy  Filed Weights   10/21/16 1317 10/22/16 0500  Weight: 99.8 kg (220 lb) 97.6 kg (215 lb 2.7 oz)    History of present illness:  Per Dr. [name] is an 66 y.o. 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 2X6.9 at 6.6  cm irregular mass in the aorticopulmonary window of the mediastinum concerning for malignancy. He subsequently underwent a video bronchoscopy/biopsy. PET scan was negative for hypermetabolic adenopathy in the chest. The patient is currently undergoing concurrent chemotherapy (etoposide and carboplatin) and radiation therapy, and saw Dr. Sondra Come on day of admission, who noted that his pulse was irregular and rapid. The patient endorsed a 3 day history of dizziness, but no chest pain or shortness of breath. He had never had palpitations. He was asymptomatic on admission. Upon initial presentation in the ED, the patient was noted to be in atrial fibrillation with RVR.  ED Course:  The patient was placed on a Cardizem drip with a CHADSVASC score of 5. Twelve-lead EKG confirmed atrial fibrillation with RVR. TSH noted to be low at 0.234. Chest x-ray negative for heart failure or cardiomegaly. Initial troponin < 0.03.  Hospital Course:  #1 new onset atrial fibrillation CHA2DS2VASC2 = 5 Questionable etiology. Likely secondary to lung pathology with small cell lung cancer currently undergoing chemotherapy. Cardiac enzymes negative 3. TSH low at 0.234 with a free T4 of 1.25. Will likely need repeat thyroid function studies done in about 4-6 weeks. 2-D echo was obtained and results were pending at time of discharge however cardiologist looked at a 2-D echo and felt was okay for patient to be discharged. Patient on admission was placed on the IV Cardizem drip. Patient subsequently converted from A. fib back to normal sinus rhythm and IV Cardizem discontinued and patient placed on oral Cardizem 30 mg every 6 hours. Patient was also placed on full dose Lovenox for anticoagulation while awaiting  cardiology recommendations. Cardizem was subsequently changed to long-acting Cardizem CD 120 mg daily. Was recommended per cardiology to start patient on anticoagulation with eliquis if okay with his oncologist. Patient's oncologist  was contacted and it was felt that patient was okayed to be started on anticoagulation with eliquis. Patient remained in stable condition. Patient was anxious to be discharged. It was felt per cardiology that patient was okayed to be discharged with close outpatient follow-up. Patient will follow-up with cardiology at the office in [place]. It has been recommended that patient's aspirin and Pletal be discontinued on discharge.  #2 hypertension Patient was maintained on home regimen of metoprolol. Patient was on IV Cardizem which was subsequently transitioned to oral Cardizem. Outpatient follow-up.  #3 hyperlipidemia Fasting lipid panel with a total cholesterol 121, LDL of 53, HDL of 24. Triglycerides of 219. Continued on home regimen of fenofibrate and Crestor.  #4 COPD Stable.  #5 diabetes mellitus type 2 Hemoglobin A1c pending at time of discharge and need to be followed up in the outpatient setting. Patient was maintained on sliding scale insulin during the hospitalization.  #6 history of stroke Stable. Continue aspirin for secondary stroke prevention. Risk factor modification. Patient was placed on anticoagulation with eliquis and as such patient's aspirin and Pletal have been discontinued.  #7 small cell lung cancer Dr. Earlie Server notified of patient's admission. Continued on radiation therapy. Will resume radiation therapy and outpatient setting. Outpatient follow-up with Dr. Lorna Few  #8 antineoplastic chemotherapy-induced pancytopenia Patient denied any overt bleeding. No signs of infection. Patient's white count at 1.6 with ANC of 1.0. Platelet count now at 92 from 117 on admission. Hemoglobin at 8.3. Patient's counts were discussed with his oncologist Dr. Lorna Few as stated that patient had recently received Neulasta and decreasing counts as expected. Oncologist was in agreement with starting patient on anticoagulation with eliquis as recommended per  cardiology. Per oncology patient gets weekly blood work done and counts will be followed closely.     Procedures:  2-D echo 10/22/2016  Chest x-ray 10/21/2016  Consultations:  Cardiology: Dr. Scarlette Calico 10/22/2016  Discharge Exam: Vitals:   10/22/16 1500 10/22/16 1609  BP:    Pulse: 92   Resp: (!) 24   Temp:  98.3 F (36.8 C)    General: NAD Cardiovascular: RRR Respiratory: CTAB  Discharge Instructions   Discharge Instructions    Diet - low sodium heart healthy    Complete by:  As directed    Increase activity slowly    Complete by:  As directed      Current Discharge Medication List    START taking these medications   Details  apixaban (ELIQUIS) 5 MG TABS tablet Take 1 tablet (5 mg total) by mouth 2 (two) times daily. Qty: 60 tablet, Refills: 3    diltiazem (CARDIZEM CD) 120 MG 24 hr capsule Take 1 capsule (120 mg total) by mouth daily. Qty: 30 capsule, Refills: 2      CONTINUE these medications which have NOT CHANGED   Details  emollient (BIAFINE) cream Apply topically 2 (two) times daily.    fenofibrate 160 MG tablet Take 160 mg by mouth daily.    glimepiride (AMARYL) 4 MG tablet Take 4 mg by mouth daily with breakfast.    lidocaine-prilocaine (EMLA) cream Apply 1 application topically as needed. Qty: 30 g, Refills: 0    metFORMIN (GLUCOPHAGE) 500 MG tablet Take 1 tablet (500 mg total) by mouth 3 (three) times daily.    metoprolol succinate (TOPROL-XL)  50 MG 24 hr tablet Take 50 mg by mouth daily. Take with or immediately following a meal.    pioglitazone (ACTOS) 15 MG tablet Take 15 mg by mouth daily.    prochlorperazine (COMPAZINE) 10 MG tablet Take 1 tablet (10 mg total) by mouth every 6 (six) hours as needed for nausea or vomiting. Qty: 30 tablet, Refills: 0    rosuvastatin (CRESTOR) 40 MG tablet Take 40 mg by mouth every evening. For cholesterol    zolpidem (AMBIEN) 10 MG tablet Take 10 mg by mouth at bedtime as needed for sleep.      Blood Glucose Monitoring Suppl (ACCU-CHEK NANO SMARTVIEW) w/Device KIT by Does not apply route.      STOP taking these medications     aspirin EC 81 MG tablet      cilostazol (PLETAL) 100 MG tablet      Doxylamine-Phenylephrine-APAP (SINUS & CONGESTION DAY/NIGHT PO)      HYDROcodone-acetaminophen (NORCO/VICODIN) 5-325 MG tablet        Allergies  Allergen Reactions  . Cialis [Tadalafil] Other (See Comments)    Reaction:  Headache    Follow-up Information    Ida Rogue, MD Follow up on 11/04/2016.   Specialty:  Cardiology Why:  at 8am for your hospital follow up appt.  Contact information: [address] [place] [address] [phone]        Simona Huh, MD. Schedule an appointment as soon as possible for a visit in 2 week(s).   Specialty:  Family Medicine Contact information: [address] [place] [address] [phone]        Eilleen Kempf., MD Follow up.   Specialty:  Oncology Why:  f/u as scheduled. Contact information: [place] [address] [phone]            The results of significant diagnostics from this hospitalization (including imaging, microbiology, ancillary and laboratory) are listed below for reference.    Significant Diagnostic Studies: Mr Jeri Cos PN Contrast  Result Date: 09/26/2016 CLINICAL DATA:  Recent diagnosis small cell lung cancer.  Staging EXAM: MRI HEAD WITHOUT AND WITH CONTRAST TECHNIQUE: Multiplanar, multiecho pulse sequences of the brain and surrounding structures were obtained without and with intravenous contrast. CONTRAST:  48m MULTIHANCE GADOBENATE DIMEGLUMINE 529 MG/ML IV SOLN COMPARISON:  04/15/2014 FINDINGS: Brain: Diffusion imaging does not show any acute or subacute infarction. There is an old small vessel infarction in the right cerebellum. There is old cortical and subcortical infarction in the deep insula and frontoparietal region on the  right. There are mild chronic small-vessel ischemic changes of the cerebral hemispheric white matter. Old small lacunar infarction right caudate. No evidence of mass lesion, hemorrhage, hydrocephalus or extra-axial collection. Vascular: Major vessels at the base of the brain show flow. Skull and upper cervical spine: Negative Sinuses/Orbits: Clear/normal Other: None significant IMPRESSION: No evidence of metastatic disease. 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 parietal infarction. Electronically Signed   By: MNelson ChimesM.D.   On: 09/26/2016 09:58   Ir [place]Guide Vasc Access Right  Addendum Date: 09/25/2016   ADDENDUM REPORT: 09/25/2016 15:53 ADDENDUM: The port was left accessed at the end of the procedure. The port was flushed with saline, not heparin. Electronically Signed   By: AMarkus DaftM.D.   On: 09/25/2016 15:53   Result Date: 09/25/2016 INDICATION: 66year old with left lung cancer. Port-A-Cath needed for chemotherapy. EXAM: FLUOROSCOPIC AND ULTRASOUND GUIDED PLACEMENT OF A  SUBCUTANEOUS PORT COMPARISON:  None. MEDICATIONS: Ancef 2 g; The antibiotic was administered within an appropriate time interval prior to skin puncture. ANESTHESIA/SEDATION: Versed 4.0 mg IV; Fentanyl 150 mcg IV; Moderate Sedation Time:  33 minutes The patient was continuously monitored during the procedure by the interventional radiology nurse under my direct supervision. FLUOROSCOPY TIME:  54 seconds, 16 mGy COMPLICATIONS: None immediate. PROCEDURE: The procedure, risks, benefits, and alternatives were explained to the patient. Questions regarding the procedure were encouraged and answered. The patient understands and consents to the procedure. Patient was placed supine on the interventional table. Ultrasound confirmed a patent right internal jugular vein. The right chest and neck were cleaned with a skin antiseptic and a sterile drape was placed. Maximal barrier sterile technique was utilized  including caps, mask, sterile gowns, sterile gloves, sterile drape, hand hygiene and skin antiseptic. The right neck was anesthetized with 1% lidocaine. Small incision was made in the right neck with a blade. Micropuncture set was placed in the right internal jugular vein with ultrasound guidance. The micropuncture wire was used for measurement purposes. The right chest was anesthetized with 1% lidocaine with epinephrine. #15 blade was used to make an incision and a subcutaneous port pocket was formed. [place] was assembled. Subcutaneous tunnel was formed with a stiff tunneling device. The port catheter was brought through the subcutaneous tunnel. The port was placed in the subcutaneous pocket. The micropuncture set was exchanged for a peel-away sheath. The catheter was placed through the peel-away sheath and the tip was positioned at the superior cavoatrial junction. Catheter placement was confirmed with fluoroscopy. The port was accessed and flushed with heparinized saline. The port pocket was closed using two layers of absorbable sutures and Dermabond. The vein skin site was closed using a single layer of absorbable suture and Dermabond. Sterile dressings were applied. Patient tolerated the procedure well without an immediate complication. Ultrasound and fluoroscopic images were taken and saved for this procedure. IMPRESSION: Placement of a subcutaneous port device. Catheter tip at the superior cavoatrial junction and ready to be used. Electronically Signed: By: Markus Daft M.D. On: 09/25/2016 10:24   Dg Chest Portable 1 View  Result Date: 10/21/2016 CLINICAL DATA:  Left-sided lung carcinoma.  Atrial fibrillation. EXAM: PORTABLE CHEST 1 VIEW COMPARISON:  September 21, 2016 FINDINGS: Port-A-Cath tip is in the superior vena cava. No pneumothorax. There is again noted a mass in the left upper lobe which appears smaller compared to 1 month prior. There is volume loss in the left upper lobe. Lungs  elsewhere clear. Heart is upper normal in size with pulmonary vascularity within normal limits. There is calcification along the left hemidiaphragm. No evident adenopathy. No bone lesions. IMPRESSION: Left upper lobe mass appears somewhat smaller and less well-defined compared to 1 month prior. Lungs elsewhere clear. Heart upper normal in size. Port-A-Cath tip in superior vena cava without pneumothorax. Calcification along left hemidiaphragm, likely due to previous asbestos exposure. Electronically Signed   By: Lowella Grip III M.D.   On: 10/21/2016 12:04   Ir Fluoro Guide Port Insertion Right  Addendum Date: 09/25/2016   ADDENDUM REPORT: 09/25/2016 15:53 ADDENDUM: The port was left accessed at the end of the procedure. The port was flushed with saline, not heparin. Electronically Signed   By: Markus Daft M.D.   On: 09/25/2016 15:53   Result Date: 09/25/2016 INDICATION: 66 year old with left lung cancer. Port-A-Cath needed for chemotherapy. EXAM: FLUOROSCOPIC AND ULTRASOUND GUIDED PLACEMENT OF A SUBCUTANEOUS PORT COMPARISON:  None. MEDICATIONS: Ancef 2 g; The antibiotic was administered within an appropriate time interval prior to skin puncture. ANESTHESIA/SEDATION: Versed 4.0 mg IV; Fentanyl 150 mcg IV; Moderate Sedation Time:  33 minutes The patient was continuously monitored during the procedure by the interventional radiology nurse under my direct supervision. FLUOROSCOPY TIME:  54 seconds, 16 mGy COMPLICATIONS: None immediate. PROCEDURE: The procedure, risks, benefits, and alternatives were explained to the patient. Questions regarding the procedure were encouraged and answered. The patient understands and consents to the procedure. Patient was placed supine on the interventional table. Ultrasound confirmed a patent right internal jugular vein. The right chest and neck were cleaned with a skin antiseptic and a sterile drape was placed. Maximal barrier sterile technique was utilized including caps,  mask, sterile gowns, sterile gloves, sterile drape, hand hygiene and skin antiseptic. The right neck was anesthetized with 1% lidocaine. Small incision was made in the right neck with a blade. Micropuncture set was placed in the right internal jugular vein with ultrasound guidance. The micropuncture wire was used for measurement purposes. The right chest was anesthetized with 1% lidocaine with epinephrine. #15 blade was used to make an incision and a subcutaneous port pocket was formed. [place] was assembled. Subcutaneous tunnel was formed with a stiff tunneling device. The port catheter was brought through the subcutaneous tunnel. The port was placed in the subcutaneous pocket. The micropuncture set was exchanged for a peel-away sheath. The catheter was placed through the peel-away sheath and the tip was positioned at the superior cavoatrial junction. Catheter placement was confirmed with fluoroscopy. The port was accessed and flushed with heparinized saline. The port pocket was closed using two layers of absorbable sutures and Dermabond. The vein skin site was closed using a single layer of absorbable suture and Dermabond. Sterile dressings were applied. Patient tolerated the procedure well without an immediate complication. Ultrasound and fluoroscopic images were taken and saved for this procedure. IMPRESSION: Placement of a subcutaneous port device. Catheter tip at the superior cavoatrial junction and ready to be used. Electronically Signed: By: Markus Daft M.D. On: 09/25/2016 10:24    Microbiology: Recent Results (from the past 240 hour(s))  MRSA PCR Screening     Status: None   Collection Time: 10/21/16 10:22 PM  Result Value Ref Range Status   MRSA by PCR NEGATIVE NEGATIVE Final    Comment:        The GeneXpert MRSA Assay (FDA approved for NASAL specimens only), is one component of a comprehensive MRSA colonization surveillance program. It is not intended to diagnose MRSA infection  nor to guide or monitor treatment for MRSA infections.      Labs: Basic Metabolic Panel:  Recent Labs Lab 10/21/16 1308 10/22/16 0914  NA 134* 134*  K 3.8 3.7  CL 103 101  CO2 25 24  GLUCOSE 160* 216*  BUN 17 15  CREATININE 0.89 0.86  CALCIUM 9.1 8.9  MG 1.8 1.8   Liver Function Tests: No results for input(s): AST, ALT, ALKPHOS, BILITOT, PROT, ALBUMIN in the last 168 hours. No results for input(s): LIPASE, AMYLASE in the last 168 hours. No results for input(s): AMMONIA in the last 168 hours. CBC:  Recent Labs Lab 10/21/16 1308 10/22/16 0914  WBC 2.3* 1.6*  NEUTROABS 1.6* 1.0*  HGB 8.7* 8.3*  HCT 25.4* 23.8*  MCV 87.3 89.8  PLT 117* 92*   Cardiac Enzymes:  Recent Labs Lab 10/21/16 1308 10/21/16 1848 10/21/16 2342 10/22/16 0616  TROPONINI <0.03 <0.03 <  0.03 <0.03   BNP: BNP (last 3 results) No results for input(s): BNP in the last 8760 hours.  ProBNP (last 3 results) No results for input(s): PROBNP in the last 8760 hours.  CBG:  Recent Labs Lab 10/21/16 1908 10/21/16 2240 10/22/16 0804 10/22/16 1200 10/22/16 1610  GLUCAP 162* 212* 160* 189* 138*       Signed:  THOMPSON,DANIEL MD.  [facility] 10/22/2016, 5:05 PM

## 2016-10-22 NOTE — Addendum Note (Signed)
Addended by: Curt Bears on: 10/22/2016 03:43 PM   Modules accepted: Level of Service

## 2016-10-23 ENCOUNTER — Ambulatory Visit
Admission: RE | Admit: 2016-10-23 | Discharge: 2016-10-23 | Disposition: A | Discharge status: Home / Self Care | Payer: BLUE CROSS/BLUE SHIELD | Source: Ambulatory Visit | Attending: Radiation Oncology | Admitting: Radiation Oncology

## 2016-10-23 DIAGNOSIS — Z510 Encounter for antineoplastic radiation therapy: Secondary | ICD-10-CM | POA: Diagnosis not present

## 2016-10-23 LAB — HEMOGLOBIN A1C
Hgb A1c MFr Bld: 8.4 % — ABNORMAL HIGH (ref 4.8–5.6)
Hgb A1c MFr Bld: 8.5 % — ABNORMAL HIGH (ref 4.8–5.6)
Mean Plasma Glucose: 194 mg/dL
Mean Plasma Glucose: 197 mg/dL

## 2016-10-23 LAB — HIV ANTIBODY (ROUTINE TESTING W REFLEX): HIV SCREEN 4TH GENERATION: NONREACTIVE

## 2016-10-24 ENCOUNTER — Telehealth: Payer: Self-pay | Admitting: Internal Medicine

## 2016-10-24 ENCOUNTER — Ambulatory Visit
Admission: RE | Admit: 2016-10-24 | Discharge: 2016-10-24 | Disposition: A | Discharge status: Home / Self Care | Payer: BLUE CROSS/BLUE SHIELD | Source: Ambulatory Visit | Attending: Radiation Oncology | Admitting: Radiation Oncology

## 2016-10-24 DIAGNOSIS — Z510 Encounter for antineoplastic radiation therapy: Secondary | ICD-10-CM | POA: Diagnosis not present

## 2016-10-24 NOTE — Telephone Encounter (Signed)
Per 3/23 schedule message cancel 4/2 appointments and keep 4/4 appointments as scheduled. Spoke with patient.

## 2016-10-27 ENCOUNTER — Ambulatory Visit
Admission: RE | Admit: 2016-10-27 | Discharge: 2016-10-27 | Disposition: A | Discharge status: Home / Self Care | Payer: BLUE CROSS/BLUE SHIELD | Source: Ambulatory Visit | Attending: Radiation Oncology | Admitting: Radiation Oncology

## 2016-10-27 DIAGNOSIS — Z510 Encounter for antineoplastic radiation therapy: Secondary | ICD-10-CM | POA: Diagnosis not present

## 2016-10-28 ENCOUNTER — Ambulatory Visit
Admission: RE | Admit: 2016-10-28 | Discharge: 2016-10-28 | Disposition: A | Discharge status: Home / Self Care | Payer: BLUE CROSS/BLUE SHIELD | Source: Ambulatory Visit | Attending: Radiation Oncology | Admitting: Radiation Oncology

## 2016-10-28 ENCOUNTER — Encounter: Payer: Self-pay | Admitting: Radiation Oncology

## 2016-10-28 VITALS — BP 120/72 | HR 72 | Temp 98.7°F | Ht 73.0 in | Wt 219.2 lb

## 2016-10-28 DIAGNOSIS — C3492 Malignant neoplasm of unspecified part of left bronchus or lung: Secondary | ICD-10-CM

## 2016-10-28 DIAGNOSIS — Z510 Encounter for antineoplastic radiation therapy: Secondary | ICD-10-CM | POA: Diagnosis not present

## 2016-10-28 DIAGNOSIS — C3482 Malignant neoplasm of overlapping sites of left bronchus and lung: Secondary | ICD-10-CM

## 2016-10-28 NOTE — Progress Notes (Signed)
Jesse Avila has completed 21 fractions to his left lung.  He denies having pain.  He is currently taking eliquis and cardizem for A Fib.  His heart rate is regular today.  He denies having any shortness of breath.  He reports having an occasional dry cough.  He denies having a sore throat or trouble swallowing.  He reports having fatigue.  The skin on his left chest and upper back is red.  He is using biafine.  BP 120/72 (BP Location: Right Arm, Patient Position: Sitting)   Pulse 72   Temp 98.7 F (37.1 C) (Oral)   Ht '6\' 1"'$  (1.854 m)   Wt 219 lb 3.2 oz (99.4 kg)   SpO2 100%   BMI 28.92 kg/m    Wt Readings from Last 3 Encounters:  10/28/16 219 lb 3.2 oz (99.4 kg)  10/22/16 215 lb 2.7 oz (97.6 kg)  10/21/16 218 lb (98.9 kg)

## 2016-10-28 NOTE — Progress Notes (Signed)
  Radiation Oncology         [phone] ________________________________  Name: Jesse Avila MRN: [id number]  Date: 10/28/2016  DOB: [birth year]  Weekly Radiation Therapy Management    ICD-9-CM ICD-10-CM   1. Malignant neoplasm of overlapping sites of left lung (HCC) 162.8 C34.82   2. Small cell lung carcinoma, left (HCC) 162.9 C34.92      Current Dose: 42 Gy     Planned Dose:  60 Gy  Narrative . . . . . . . . The patient presents for routine under treatment assessment.                                   The patient was sent to the ED on 10/21/16 after his encounter for probable Afib. 12-lead EKG confirmed atrial fibrillation with RVR. He was discharged on 10/22/16 with long acting Cardizem and anticoagulation with Eliquis.  He has completed 21 fractions to his left lung.  He denies having pain. He states he does not feel any different than last week before his Afib diagnosis. He denies having any shortness of breath.  He reports having an occasional dry cough.  He denies having a sore throat or difficulty swallowing.  He reports having fatigue.  The patient states the skin on his left chest and upper back is slightly red. He is using biafine in the treatment area.                                  Set-up films were reviewed.                                 The chart was checked. Physical Findings. . .  height is '6\' 1"'$  (1.854 m) and weight is 219 lb 3.2 oz (99.4 kg). His oral temperature is 98.7 F (37.1 C). His blood pressure is 120/72 and his pulse is 72. His oxygen saturation is 100%.   Lungs are clear to auscultation bilaterally. Heart has regular rate and rhythm. minimal skin reaction. Impression . . . . . . . The patient is tolerating radiation.  Plan . . . . . . . . . . . . Continue treatment as planned.  _______________________________   Blair Promise, PhD, MD  This document serves as a record of services personally performed by Gery Pray, MD. It was created on his behalf by  Darcus Austin, a trained medical scribe. The creation of this record is based on the scribe's personal observations and the provider's statements to them. This document has been checked and approved by the attending provider.

## 2016-10-29 ENCOUNTER — Ambulatory Visit
Admission: RE | Admit: 2016-10-29 | Discharge: 2016-10-29 | Disposition: A | Discharge status: Home / Self Care | Payer: BLUE CROSS/BLUE SHIELD | Source: Ambulatory Visit | Attending: Radiation Oncology | Admitting: Radiation Oncology

## 2016-10-29 DIAGNOSIS — Z510 Encounter for antineoplastic radiation therapy: Secondary | ICD-10-CM | POA: Diagnosis not present

## 2016-10-30 ENCOUNTER — Ambulatory Visit
Admission: RE | Admit: 2016-10-30 | Discharge: 2016-10-30 | Disposition: A | Discharge status: Home / Self Care | Payer: BLUE CROSS/BLUE SHIELD | Source: Ambulatory Visit | Attending: Radiation Oncology | Admitting: Radiation Oncology

## 2016-10-30 DIAGNOSIS — Z510 Encounter for antineoplastic radiation therapy: Secondary | ICD-10-CM | POA: Diagnosis not present

## 2016-10-31 ENCOUNTER — Ambulatory Visit
Admission: RE | Admit: 2016-10-31 | Discharge: 2016-10-31 | Disposition: A | Discharge status: Home / Self Care | Payer: BLUE CROSS/BLUE SHIELD | Source: Ambulatory Visit | Attending: Radiation Oncology | Admitting: Radiation Oncology

## 2016-10-31 DIAGNOSIS — Z510 Encounter for antineoplastic radiation therapy: Secondary | ICD-10-CM | POA: Diagnosis not present

## 2016-11-03 ENCOUNTER — Ambulatory Visit
Admission: RE | Admit: 2016-11-03 | Discharge: 2016-11-03 | Disposition: A | Discharge status: Home / Self Care | Payer: BLUE CROSS/BLUE SHIELD | Source: Ambulatory Visit | Attending: Radiation Oncology | Admitting: Radiation Oncology

## 2016-11-03 ENCOUNTER — Other Ambulatory Visit: Payer: BLUE CROSS/BLUE SHIELD

## 2016-11-03 DIAGNOSIS — Z510 Encounter for antineoplastic radiation therapy: Secondary | ICD-10-CM | POA: Diagnosis not present

## 2016-11-04 ENCOUNTER — Ambulatory Visit
Admission: RE | Admit: 2016-11-04 | Discharge: 2016-11-04 | Disposition: A | Discharge status: Home / Self Care | Payer: BLUE CROSS/BLUE SHIELD | Source: Ambulatory Visit | Attending: Radiation Oncology | Admitting: Radiation Oncology

## 2016-11-04 ENCOUNTER — Ambulatory Visit (HOSPITAL_COMMUNITY)
Admission: RE | Admit: 2016-11-04 | Discharge: 2016-11-04 | Disposition: A | Discharge status: Home / Self Care | Payer: BLUE CROSS/BLUE SHIELD | Source: Ambulatory Visit | Attending: Oncology | Admitting: Oncology

## 2016-11-04 ENCOUNTER — Encounter: Payer: BLUE CROSS/BLUE SHIELD | Admitting: Cardiovascular Disease

## 2016-11-04 ENCOUNTER — Encounter (HOSPITAL_COMMUNITY): Payer: Self-pay

## 2016-11-04 VITALS — BP 130/71 | HR 82 | Temp 98.1°F | Resp 16 | Wt 218.4 lb

## 2016-11-04 DIAGNOSIS — K5730 Diverticulosis of large intestine without perforation or abscess without bleeding: Secondary | ICD-10-CM | POA: Diagnosis not present

## 2016-11-04 DIAGNOSIS — K760 Fatty (change of) liver, not elsewhere classified: Secondary | ICD-10-CM | POA: Diagnosis not present

## 2016-11-04 DIAGNOSIS — C3492 Malignant neoplasm of unspecified part of left bronchus or lung: Secondary | ICD-10-CM | POA: Diagnosis not present

## 2016-11-04 DIAGNOSIS — I70203 Unspecified atherosclerosis of native arteries of extremities, bilateral legs: Secondary | ICD-10-CM | POA: Insufficient documentation

## 2016-11-04 DIAGNOSIS — J439 Emphysema, unspecified: Secondary | ICD-10-CM | POA: Diagnosis not present

## 2016-11-04 DIAGNOSIS — C3402 Malignant neoplasm of left main bronchus: Secondary | ICD-10-CM

## 2016-11-04 DIAGNOSIS — Z510 Encounter for antineoplastic radiation therapy: Secondary | ICD-10-CM | POA: Diagnosis not present

## 2016-11-04 DIAGNOSIS — M899 Disorder of bone, unspecified: Secondary | ICD-10-CM | POA: Insufficient documentation

## 2016-11-04 DIAGNOSIS — J929 Pleural plaque without asbestos: Secondary | ICD-10-CM | POA: Diagnosis not present

## 2016-11-04 MED ORDER — BIAFINE EX EMUL
Freq: Two times a day (BID) | CUTANEOUS | Status: DC
  Administered 2016-11-04: 10:00:00 via TOPICAL

## 2016-11-04 MED ORDER — IOPAMIDOL (ISOVUE-300) INJECTION 61%
INTRAVENOUS | Status: AC
  Filled 2016-11-04: qty 100

## 2016-11-04 MED ORDER — IOPAMIDOL (ISOVUE-300) INJECTION 61%
100.0000 mL | Freq: Once | INTRAVENOUS | Status: AC | PRN
  Administered 2016-11-04: 100 mL via INTRAVENOUS

## 2016-11-04 NOTE — Addendum Note (Signed)
Encounter addended by: Wilmon Arms, RN on: 11/04/2016  3:50 PM<BR>    Actions taken: [facility] administration accepted

## 2016-11-04 NOTE — Progress Notes (Signed)
Jesse Avila has completed the 26th fraction of radiation to his left lung.  Patient denies any pain.  He reports feeling weird like a zombie since he was started on Eliquis for his new afib.  Patient reports being fatigued.  Reports having no issues with his appetite.  Seldom has a dry cough.  Denies any sputum.  Denies any issues with swallowing and/or fullness or lump in throat when eating or drinking.  Denies any shortness of breath at resting but does get more tired and short of breath with exertion.    Vitals:   11/04/16 0956  BP: 130/71  Pulse: 82  Resp: 16  Temp: 98.1 F (36.7 C)  TempSrc: Oral  SpO2: 100%  Weight: 218 lb 6.4 oz (99.1 kg)   Wt Readings from Last 3 Encounters:  11/04/16 218 lb 6.4 oz (99.1 kg)  10/28/16 219 lb 3.2 oz (99.4 kg)  10/22/16 215 lb 2.7 oz (97.6 kg)

## 2016-11-04 NOTE — Progress Notes (Signed)
  Radiation Oncology         [phone] ________________________________  Name: Jesse Avila MRN: [id number]  Date: 11/04/2016  DOB: [birth year]  Weekly Radiation Therapy Management    ICD-9-CM ICD-10-CM   1. Malignant neoplasm of hilus of left lung (HCC) 162.2 C34.02 topical emolient (BIAFINE) emulsion  2. Small cell lung carcinoma, left (HCC) 162.9 C34.92      Current Dose: 52 Gy     Planned Dose:  60 Gy  Narrative . . . . . . . . The patient presents for routine under treatment assessment.                                   Jesse Avila has completed the 26th fraction of radiation to his left lung. Patient denies any pain. He reports feeling "weird like a zombie" since he was started on Eliquis for his new afib. The patient will speak to his PCP regarding this. Patient reports being fatigued. Reports having no issues with his appetite. Seldom has a dry cough. Denies any sputum, any issues with swallowing and/or fullness or lump in throat when eating or drinking, or any shortness of breath at resting, but does get more tired and short of breath with exertion.                                   Set-up films were reviewed.                                 The chart was checked. Physical Findings. . .  weight is 218 lb 6.4 oz (99.1 kg). His oral temperature is 98.1 F (36.7 C). His blood pressure is 130/71 and his pulse is 82. His respiration is 16 and oxygen saturation is 100%.   Lungs are clear to auscultation bilaterally. Heart has regular rate and rhythm. Minimal skin reaction. Impression . . . . . . . The patient is tolerating radiation.  Plan . . . . . . . . . . . . Continue treatment as planned.  _______________________________   Blair Promise, PhD, MD  This document serves as a record of services personally performed by Gery Pray, MD. It was created on his behalf by Darcus Austin, a trained medical scribe. The creation of this record is based on the scribe's personal observations  and the provider's statements to them. This document has been checked and approved by the attending provider.

## 2016-11-05 ENCOUNTER — Ambulatory Visit
Admission: RE | Admit: 2016-11-05 | Discharge: 2016-11-05 | Disposition: A | Discharge status: Home / Self Care | Payer: BLUE CROSS/BLUE SHIELD | Source: Ambulatory Visit | Attending: Radiation Oncology | Admitting: Radiation Oncology

## 2016-11-05 ENCOUNTER — Telehealth: Payer: Self-pay | Admitting: Internal Medicine

## 2016-11-05 ENCOUNTER — Other Ambulatory Visit (HOSPITAL_BASED_OUTPATIENT_CLINIC_OR_DEPARTMENT_OTHER): Payer: BLUE CROSS/BLUE SHIELD

## 2016-11-05 ENCOUNTER — Encounter: Payer: Self-pay | Admitting: *Deleted

## 2016-11-05 ENCOUNTER — Ambulatory Visit (HOSPITAL_COMMUNITY)
Admission: RE | Admit: 2016-11-05 | Discharge: 2016-11-05 | Disposition: A | Discharge status: Home / Self Care | Payer: BLUE CROSS/BLUE SHIELD | Source: Ambulatory Visit | Attending: Internal Medicine | Admitting: Internal Medicine

## 2016-11-05 ENCOUNTER — Encounter: Payer: Self-pay | Admitting: Internal Medicine

## 2016-11-05 ENCOUNTER — Other Ambulatory Visit: Payer: Self-pay | Admitting: Medical Oncology

## 2016-11-05 ENCOUNTER — Ambulatory Visit: Payer: BLUE CROSS/BLUE SHIELD

## 2016-11-05 ENCOUNTER — Ambulatory Visit (HOSPITAL_BASED_OUTPATIENT_CLINIC_OR_DEPARTMENT_OTHER): Payer: BLUE CROSS/BLUE SHIELD

## 2016-11-05 ENCOUNTER — Ambulatory Visit (HOSPITAL_BASED_OUTPATIENT_CLINIC_OR_DEPARTMENT_OTHER): Payer: BLUE CROSS/BLUE SHIELD | Admitting: Internal Medicine

## 2016-11-05 VITALS — BP 120/67 | HR 72 | Temp 98.4°F | Resp 20 | Wt 217.8 lb

## 2016-11-05 DIAGNOSIS — D649 Anemia, unspecified: Secondary | ICD-10-CM | POA: Diagnosis not present

## 2016-11-05 DIAGNOSIS — Z95828 Presence of other vascular implants and grafts: Secondary | ICD-10-CM | POA: Insufficient documentation

## 2016-11-05 DIAGNOSIS — I10 Essential (primary) hypertension: Secondary | ICD-10-CM

## 2016-11-05 DIAGNOSIS — I739 Peripheral vascular disease, unspecified: Secondary | ICD-10-CM

## 2016-11-05 DIAGNOSIS — Z5111 Encounter for antineoplastic chemotherapy: Secondary | ICD-10-CM

## 2016-11-05 DIAGNOSIS — C3412 Malignant neoplasm of upper lobe, left bronchus or lung: Secondary | ICD-10-CM

## 2016-11-05 DIAGNOSIS — E1159 Type 2 diabetes mellitus with other circulatory complications: Secondary | ICD-10-CM

## 2016-11-05 DIAGNOSIS — C3492 Malignant neoplasm of unspecified part of left bronchus or lung: Secondary | ICD-10-CM

## 2016-11-05 DIAGNOSIS — Z7189 Other specified counseling: Secondary | ICD-10-CM

## 2016-11-05 DIAGNOSIS — D6481 Anemia due to antineoplastic chemotherapy: Secondary | ICD-10-CM | POA: Diagnosis not present

## 2016-11-05 DIAGNOSIS — Z510 Encounter for antineoplastic radiation therapy: Secondary | ICD-10-CM | POA: Diagnosis not present

## 2016-11-05 DIAGNOSIS — E119 Type 2 diabetes mellitus without complications: Secondary | ICD-10-CM

## 2016-11-05 DIAGNOSIS — C3402 Malignant neoplasm of left main bronchus: Secondary | ICD-10-CM

## 2016-11-05 DIAGNOSIS — C7951 Secondary malignant neoplasm of bone: Secondary | ICD-10-CM | POA: Diagnosis not present

## 2016-11-05 LAB — CBC WITH DIFFERENTIAL/PLATELET
BASO%: 0.4 % (ref 0.0–2.0)
Basophils Absolute: 0 10*3/uL (ref 0.0–0.1)
EOS ABS: 0 10*3/uL (ref 0.0–0.5)
EOS%: 0.4 % (ref 0.0–7.0)
HEMATOCRIT: 22.5 % — AB (ref 38.4–49.9)
LYMPH#: 0.6 10*3/uL — AB (ref 0.9–3.3)
LYMPH%: 23 % (ref 14.0–49.0)
MCH: 30.4 pg (ref 27.2–33.4)
MCHC: 32.4 g/dL (ref 32.0–36.0)
MCV: 93.8 fL (ref 79.3–98.0)
MONO#: 0.3 10*3/uL (ref 0.1–0.9)
MONO%: 12.3 % (ref 0.0–14.0)
NEUT%: 63.9 % (ref 39.0–75.0)
NEUTROS ABS: 1.7 10*3/uL (ref 1.5–6.5)
NRBC: 1 % — AB (ref 0–0)
PLATELETS: 202 10*3/uL (ref 140–400)
RBC: 2.4 10*6/uL — ABNORMAL LOW (ref 4.20–5.82)
RDW: 17.3 % — AB (ref 11.0–14.6)
WBC: 2.6 10*3/uL — AB (ref 4.0–10.3)

## 2016-11-05 LAB — COMPREHENSIVE METABOLIC PANEL
ALT: 19 U/L (ref 0–55)
ANION GAP: 9 meq/L (ref 3–11)
AST: 18 U/L (ref 5–34)
Albumin: 3.5 g/dL (ref 3.5–5.0)
Alkaline Phosphatase: 62 U/L (ref 40–150)
BILIRUBIN TOTAL: 0.41 mg/dL (ref 0.20–1.20)
BUN: 10 mg/dL (ref 7.0–26.0)
CALCIUM: 9.3 mg/dL (ref 8.4–10.4)
CHLORIDE: 107 meq/L (ref 98–109)
CO2: 22 mEq/L (ref 22–29)
CREATININE: 1 mg/dL (ref 0.7–1.3)
EGFR: 78 mL/min/{1.73_m2} — ABNORMAL LOW (ref >90)
Glucose: 223 mg/dl — ABNORMAL HIGH (ref 70–140)
Potassium: 3.6 mEq/L (ref 3.5–5.1)
Sodium: 138 mEq/L (ref 136–145)
TOTAL PROTEIN: 6.5 g/dL (ref 6.4–8.3)

## 2016-11-05 LAB — ABO/RH: ABO/RH(D): O POS

## 2016-11-05 MED ORDER — SODIUM CHLORIDE 0.9% FLUSH
10.0000 mL | INTRAVENOUS | Status: DC | PRN
  Administered 2016-11-05: 10 mL via INTRAVENOUS
  Filled 2016-11-05: qty 10

## 2016-11-05 MED ORDER — SODIUM CHLORIDE 0.9 % IV SOLN
100.0000 mg/m2 | Freq: Once | INTRAVENOUS | Status: AC
  Administered 2016-11-05: 230 mg via INTRAVENOUS
  Filled 2016-11-05: qty 11.5

## 2016-11-05 MED ORDER — DEXAMETHASONE SODIUM PHOSPHATE 10 MG/ML IJ SOLN
INTRAMUSCULAR | Status: AC
  Filled 2016-11-05: qty 1

## 2016-11-05 MED ORDER — HEPARIN SOD (PORK) LOCK FLUSH 100 UNIT/ML IV SOLN
500.0000 [IU] | Freq: Once | INTRAVENOUS | Status: AC | PRN
  Administered 2016-11-05: 500 [IU]
  Filled 2016-11-05: qty 5

## 2016-11-05 MED ORDER — PALONOSETRON HCL INJECTION 0.25 MG/5ML
0.2500 mg | Freq: Once | INTRAVENOUS | Status: AC
  Administered 2016-11-05: 0.25 mg via INTRAVENOUS

## 2016-11-05 MED ORDER — DEXAMETHASONE SODIUM PHOSPHATE 10 MG/ML IJ SOLN
10.0000 mg | Freq: Once | INTRAMUSCULAR | Status: AC
Start: 2016-11-05 — End: 2016-11-05
  Administered 2016-11-05: 10 mg via INTRAVENOUS

## 2016-11-05 MED ORDER — PALONOSETRON HCL INJECTION 0.25 MG/5ML
INTRAVENOUS | Status: AC
  Filled 2016-11-05: qty 5

## 2016-11-05 MED ORDER — SODIUM CHLORIDE 0.9 % IV SOLN
667.0000 mg | Freq: Once | INTRAVENOUS | Status: AC
  Administered 2016-11-05: 670 mg via INTRAVENOUS
  Filled 2016-11-05: qty 67

## 2016-11-05 MED ORDER — SODIUM CHLORIDE 0.9 % IV SOLN
Freq: Once | INTRAVENOUS | Status: AC
  Administered 2016-11-05: 12:00:00 via INTRAVENOUS

## 2016-11-05 MED ORDER — SODIUM CHLORIDE 0.9% FLUSH
10.0000 mL | INTRAVENOUS | Status: DC | PRN
  Administered 2016-11-05: 10 mL
  Filled 2016-11-05: qty 10

## 2016-11-05 NOTE — Progress Notes (Signed)
Patient received treatment today per MD. Blood transfusion scheduled for tomorrow.

## 2016-11-05 NOTE — Telephone Encounter (Signed)
r/s appt per Schedule message 11/05/2016 from [facility] - patient to receive new schedule after treatment.

## 2016-11-05 NOTE — Progress Notes (Signed)
Oncology Nurse Navigator Documentation  Oncology Nurse Navigator Flowsheets 11/05/2016  Navigator Location [facility]  Navigator Encounter Type Clinic/MDC/I followed up with Dr. Julien Nordmann regarding Jesse Avila.  Jesse Avila states Jesse Avila is doing well with treatment.  I went to speak with him in chemo.  Jesse Avila is doing well with out complaints.  I did give him more educational material on Jesse Avila type of lung cancer and information from [place] if Jesse Avila would like to reach out and connect with others going through treatment.  Jesse Avila and Jesse Avila wife were thankful for the information.   Patient Visit Type MedOnc  Treatment Phase Treatment  Barriers/Navigation Needs Education  Education Other  Interventions Education  Education Method Verbal;Written  Acuity Level 1  Acuity Level 1 Minimal follow up required  Acuity Level 2 -  Time Spent with Patient 15

## 2016-11-05 NOTE — Telephone Encounter (Signed)
Patient in treatment area and will receive AVS and schedule back there. Scheduled per 11/05/16 los and treatment plan .

## 2016-11-05 NOTE — Patient Instructions (Signed)
[facility] Discharge Instructions for Patients Receiving Chemotherapy  Today you received the following chemotherapy agents:  Carboplatin (paraplatin), Etoposide (VP-16)  To help prevent nausea and vomiting after your treatment, we encourage you to take your nausea medication as prescribed.   If you develop nausea and vomiting that is not controlled by your nausea medication, call the clinic.   BELOW ARE SYMPTOMS THAT SHOULD BE REPORTED IMMEDIATELY:  *FEVER GREATER THAN 100.5 F  *CHILLS WITH OR WITHOUT FEVER  NAUSEA AND VOMITING THAT IS NOT CONTROLLED WITH YOUR NAUSEA MEDICATION  *UNUSUAL SHORTNESS OF BREATH  *UNUSUAL BRUISING OR BLEEDING  TENDERNESS IN MOUTH AND THROAT WITH OR WITHOUT PRESENCE OF ULCERS  *URINARY PROBLEMS  *BOWEL PROBLEMS  UNUSUAL RASH Items with * indicate a potential emergency and should be followed up as soon as possible.  Feel free to call the clinic you have any questions or concerns. The clinic phone number is [phone].  Please show the [facility] at check-in to the Emergency Department and triage nurse.

## 2016-11-05 NOTE — Progress Notes (Signed)
[facility] [phone]   [phone]  OFFICE PROGRESS NOTE  Simona Huh, MD [address] [place] [address]  DIAGNOSIS: Extensive stage (T4, N2, M1b) small cell lung cancer presented with large left upper lobe lung mass with mediastinal invasion as well as pancreatic lesion and suspicious left iliac bone metastasis diagnosed in February 2018.  PRIOR THERAPY: None  CURRENT THERAPY: Systemic chemotherapy with carbo platinum for AUC of 5 on day 1 and etoposide 100 MG/M2 on days 1, 2 and 3 with Neulasta support status post 2 cycles. This is concurrent with radiotherapy. This also concurrent with radiation to the large lung mass.  INTERVAL HISTORY: FRANCISZEK PLATTEN 66 y.o. male returns to the clinic today for returns to the clinic today for follow-up visit accompanied by his wife. The patient is currently undergoing systemic chemotherapy with carboplatin and Alimta and tolerating his treatment well. He has no nausea, vomiting, diarrhea or constipation. He has no chest pain, shortness of breath, cough or hemoptysis. He has no fever or chills. He denied having any significant peripheral neuropathy. He is expected to complete the course of radiotherapy next week. He had repeat CT scan of the chest, abdomen and pelvis performed recently and he is here for evaluation and discussion of his scan results.  MEDICAL HISTORY: Past Medical History:  Diagnosis Date  . Carotid artery occlusion   . DJD (degenerative joint disease) of knee   . Essential hypertension, benign    takes Benicar daily  . Goals of care, counseling/discussion 09/18/2016  . History of colon polyps    benign  . History of gout   . Hyperlipidemia    takes Fenofibrate and Crestor daily  . Insomnia    takes Ambien nightly as needed  . Joint pain   . Lung cancer ([place])   . Nocturia   . Peripheral vascular disease ([place])   . Primary localized osteoarthritis of left knee  12/26/2014  . Stroke [facility]) 2015?   takes Plavix daily as well as Pletal,not on plavix at present 05/15/15  . 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  MEDICATIONS:  Current Outpatient Prescriptions  Medication Sig Dispense Refill  . apixaban (ELIQUIS) 5 MG TABS tablet Take 1 tablet (5 mg total) by mouth 2 (two) times daily. 60 tablet 3  . Blood Glucose Monitoring Suppl (ACCU-CHEK NANO SMARTVIEW) w/Device KIT by Does not apply route.    . diltiazem (CARDIZEM CD) 120 MG 24 hr capsule Take 1 capsule (120 mg total) by mouth daily. 30 capsule 2  . emollient (BIAFINE) cream Apply topically 2 (two) times daily.    . fenofibrate 160 MG tablet Take 160 mg by mouth daily.    Marland Kitchen glimepiride (AMARYL) 4 MG tablet Take 4 mg by mouth daily with breakfast.    . lidocaine-prilocaine (EMLA) cream Apply 1 application topically as needed. 30 g 0  . metFORMIN (GLUCOPHAGE) 500 MG tablet Take 1 tablet (500 mg total) by mouth 3 (three) times daily.    . metoprolol succinate (TOPROL-XL) 50 MG 24 hr tablet Take 50 mg by mouth daily. Take with or immediately following a meal.    . pioglitazone (ACTOS) 15 MG tablet Take 15 mg by mouth daily.    . rosuvastatin (CRESTOR) 40 MG tablet Take 40 mg by  mouth every evening. For cholesterol    . zolpidem (AMBIEN) 10 MG tablet Take 10 mg by mouth at bedtime as needed for sleep.     Marland Kitchen prochlorperazine (COMPAZINE) 10 MG tablet Take 1 tablet (10 mg total) by mouth every 6 (six) hours as needed for nausea or vomiting. (Patient not taking: Reported on 11/05/2016) 30 tablet 0   No current facility-administered medications for this visit.    Facility-Administered Medications Ordered in Other Visits  Medication Dose Route Frequency Provider Last Rate Last Dose  . sodium chloride flush (NS) 0.9 % injection 10 mL  10 mL  Intracatheter PRN Curt Bears, MD   10 mL at 10/13/16 1618    SURGICAL HISTORY:  Past Surgical History:  Procedure Laterality Date  . ABDOMINAL AORTAGRAM N/A 03/29/2012   Procedure: ABDOMINAL Maxcine Ham;  Surgeon: Angelia Mould, MD;  Location: [facility] CATH LAB;  Service: Cardiovascular;  Laterality: N/A;  . COLONOSCOPY    . ENDARTERECTOMY Right 05/09/2014   Procedure: RIGHT CAROTID ENDARTERECTOMY WITH PATCH ANGIOPLASTY;  Surgeon: Conrad , MD;  Location: [facility];  Service: Vascular;  Laterality: Right;  . ENDARTERECTOMY FEMORAL Right 05/17/2015   Procedure: ENDARTERECTOMY FEMORAL;  Surgeon: Angelia Mould, MD;  Location: [facility];  Service: Vascular;  Laterality: Right;  . ENDOBRONCHIAL ULTRASOUND Bilateral 09/08/2016   Procedure: ENDOBRONCHIAL ULTRASOUND;  Surgeon: Rigoberto Noel, MD;  Location: [facility] ENDOSCOPY;  Service: Cardiopulmonary;  Laterality: Bilateral;  . FEMORAL-POPLITEAL BYPASS GRAFT Right 05/17/2015   Procedure: BYPASS GRAFT FEMORAL-BELOW KNEE POPLITEAL ARTERY, using gortex propaten graft 6 mm x 80 cm;  Surgeon: Angelia Mould, MD;  Location: [facility];  Service: Vascular;  Laterality: Right;  . IR GENERIC HISTORICAL  09/25/2016   IR FLUORO GUIDE PORT INSERTION RIGHT 09/25/2016 Markus Daft, MD [facility]-INTERV RAD  . IR GENERIC HISTORICAL  09/25/2016   IR US GUIDE VASC ACCESS RIGHT 09/25/2016 Markus Daft, MD [facility]INTER[facility]  . KNEE ARTHROSCOPY Right 11/2009  . LOWER EXTREMITY ANGIOGRAM Bilateral 03/23/2015   Procedure: Lower Extremity Angiogram;  Surgeon: Angelia Mould, MD;  Location: [facility] CV LAB;  Service: Cardiovascular;  Laterality: Bilateral;  . MENISCUS REPAIR  11/2009  . PARTIAL KNEE ARTHROPLASTY Left 12/26/2014   Procedure: UNICOMPARTMENTAL KNEE;  Surgeon: Marchia Bond, MD;  Location: [facility];  Service: Orthopedics;  Laterality: Left;  . PATCH ANGIOPLASTY Right 05/17/2015   Procedure: VEIN PATCH ANGIOPLASTY ILEOFEMORAL ARTERY;  Surgeon: Angelia Mould, MD;   Location: [facility];  Service: Vascular;  Laterality: Right;  . PERCUTANEOUS STENT INTERVENTION N/A 05/10/2012   Procedure: PERCUTANEOUS STENT INTERVENTION;  Surgeon: Angelia Mould, MD;  Location: [facility] CATH LAB;  Service: Cardiovascular;  Laterality: N/A;  . PERIPHERAL VASCULAR CATHETERIZATION N/A 03/23/2015   Procedure: Abdominal Aortogram;  Surgeon: Angelia Mould, MD;  Location: [facility] CV LAB;  Service: Cardiovascular;  Laterality: N/A;  . STENTS     PLACED IN ??BOTH LEGS   2013?    REVIEW OF SYSTEMS:  Constitutional: positive for fatigue Eyes: negative Ears, nose, mouth, throat, and face: negative Respiratory: negative Cardiovascular: negative Gastrointestinal: negative Genitourinary:negative Integument/breast: negative Hematologic/lymphatic: negative Musculoskeletal:negative Neurological: negative Behavioral/Psych: negative Endocrine: negative Allergic/Immunologic: negative   PHYSICAL EXAMINATION: General appearance: alert, cooperative, fatigued and no distress Head: Normocephalic, without obvious abnormality, atraumatic Neck: no adenopathy, no JVD, supple, symmetrical, trachea midline and thyroid not enlarged, symmetric, no tenderness/mass/nodules Lymph nodes: Cervical, supraclavicular, and axillary nodes normal. Resp: clear to auscultation bilaterally Back: symmetric, no curvature. ROM normal. No CVA  tenderness. Cardio: regular rate and rhythm, S1, S2 normal, no murmur, click, rub or gallop GI: soft, non-tender; bowel sounds normal; no masses,  no organomegaly Extremities: extremities normal, atraumatic, no cyanosis or edema Neurologic: Alert and oriented X 3, normal strength and tone. Normal symmetric reflexes. Normal coordination and gait  ECOG PERFORMANCE STATUS: 1 - Symptomatic but completely ambulatory  Blood pressure 120/67, pulse 72, temperature 98.4 F (36.9 C), temperature source Oral, resp. rate 20, weight 217 lb 12.8 oz (98.8 kg), SpO2 100  %.  LABORATORY DATA: Lab Results  Component Value Date   WBC 2.6 (L) 11/05/2016   HGB 7.3 Repeated and Verified (L) 11/05/2016   HCT 22.5 (L) 11/05/2016   MCV 93.8 11/05/2016   PLT 202 11/05/2016      Chemistry      Component Value Date/Time   NA 138 11/05/2016 0947   K 3.6 11/05/2016 0947   CL 101 10/22/2016 0914   CO2 22 11/05/2016 0947   BUN 10.0 11/05/2016 0947   CREATININE 1.0 11/05/2016 0947      Component Value Date/Time   CALCIUM 9.3 11/05/2016 0947   ALKPHOS 62 11/05/2016 0947   AST 18 11/05/2016 0947   ALT 19 11/05/2016 0947   BILITOT 0.41 11/05/2016 0947       RADIOGRAPHIC STUDIES: Ct Chest W Contrast  Result Date: 11/04/2016 CLINICAL DATA:  Small cell lung cancer diagnosed in late January 2018, restaging. EXAM: CT CHEST, ABDOMEN, AND PELVIS WITH CONTRAST TECHNIQUE: Multidetector CT imaging of the chest, abdomen and pelvis was performed following the standard protocol during bolus administration of intravenous contrast. CONTRAST:  162m ISOVUE-300 IOPAMIDOL (ISOVUE-300) INJECTION 61% COMPARISON:  Multiple exams, including PET-CT from 09/17/2016 FINDINGS: CT CHEST FINDINGS Cardiovascular: Coronary, aortic arch, and branch vessel atherosclerotic vascular disease. Narrow left upper lobe pulmonary arterial vessels probably due to a combination of direct mass effect from tumor and hypoaeration leading to reduced blood flow. Mediastinum/Nodes: The left upper lobe suprahilar mass extending into the AP window measures 5.7 by 4.6 cm, previously 8.7 by 7.2 cm. Tumor extends along the upper margin of the left upper lobe pulmonary artery and around the left tracheobronchial tree. Mild diffuse wall thickening in the esophagus. Lungs/Pleura: As noted above the left suprahilar mass is reduced in size. There is considerable narrowing of the left upper lobe tracheobronchial tree is specially the non lingular branches, with associated airway thickening, likely due to extrinsic effect of  the tumor. This in turn probably causes hypoaeration and reduced pulmonary arterial supply through vaso constriction. In addition to the suprahilar mass with bandlike densities radiating from and distally, there is interstitial accentuation in this portion of the left upper lobe. Mild paraseptal emphysema.  Bilateral calcified pleural plaques. Musculoskeletal: Lower thoracic spondylosis. CT ABDOMEN PELVIS FINDINGS Hepatobiliary: Diffuse hepatic steatosis. Hepatic morphology raises the possibility of cirrhosis. No well-defined hepatic mass. Contracted gallbladder. Pancreas: I do not see differential enhancement in the uncinate process of the pancreas, this area was hypermetabolic on the prior exam concerning for potential focal malignancy. Spleen: Unremarkable Adrenals/Urinary Tract: Unremarkable Stomach/Bowel: Sigmoid diverticulosis. Vascular/Lymphatic: Aortoiliac atherosclerotic vascular disease. Small porta hepatis and retroperitoneal lymph nodes do not appear pathologically enlarged by size criteria. There is evidence of a thrombosed right femoral to distal graft. Significant narrowing of the proximal SFA bilaterally. Reproductive: Unremarkable Other: No supplemental non-categorized findings. Musculoskeletal: At the site of ill-defined metabolic activity in the left iliac bone lateral to the sacroiliac joint on the prior exam, there is currently some very  faint asymmetric sclerosis as shown on image 101/2. This appears more conspicuous than on the prior PET-CT. IMPRESSION: 1. Marked reduction in size of the left upper lobe mass, currently 5.7 by 4.6 cm, previously 8.7 by 7.2 cm. The residual tumor extends around the left upper lobe tracheobronchial tree and narrows left upper lobe pulmonary arterial structures as well as the tracheobronchial tree. 2. At the site of the prior focal hypermetabolic activity in the uncinate process of the pancreas, I do not see a distinct tumor. Occult tumor is certainly a  possibility given the prior PET-CT findings. 3. At the site of prior ill-defined metabolic activity in the left iliac bone there is faint but asymmetric and increased sclerosis, probably reflecting treated osseous metastatic lesion. 4. Considerable atherosclerosis, with high-grade narrowing of the proximal superficial femoral arteries bilaterally. 5. Mild diffuse wall thickening of the esophagus suggesting esophagitis. 6. Other imaging findings of potential clinical significance: Paraseptal emphysema. Bilateral calcified pleural plaques. Diffuse hepatic steatosis with hepatic morphology raising the possibility of cirrhosis. Sigmoid diverticulosis. Electronically Signed   By: Van Clines M.D.   On: 11/04/2016 14:48   Ct Abdomen Pelvis W Contrast  Result Date: 11/04/2016 CLINICAL DATA:  Small cell lung cancer diagnosed in late January 2018, restaging. EXAM: CT CHEST, ABDOMEN, AND PELVIS WITH CONTRAST TECHNIQUE: Multidetector CT imaging of the chest, abdomen and pelvis was performed following the standard protocol during bolus administration of intravenous contrast. CONTRAST:  17m ISOVUE-300 IOPAMIDOL (ISOVUE-300) INJECTION 61% COMPARISON:  Multiple exams, including PET-CT from 09/17/2016 FINDINGS: CT CHEST FINDINGS Cardiovascular: Coronary, aortic arch, and branch vessel atherosclerotic vascular disease. Narrow left upper lobe pulmonary arterial vessels probably due to a combination of direct mass effect from tumor and hypoaeration leading to reduced blood flow. Mediastinum/Nodes: The left upper lobe suprahilar mass extending into the AP window measures 5.7 by 4.6 cm, previously 8.7 by 7.2 cm. Tumor extends along the upper margin of the left upper lobe pulmonary artery and around the left tracheobronchial tree. Mild diffuse wall thickening in the esophagus. Lungs/Pleura: As noted above the left suprahilar mass is reduced in size. There is considerable narrowing of the left upper lobe tracheobronchial tree  is specially the non lingular branches, with associated airway thickening, likely due to extrinsic effect of the tumor. This in turn probably causes hypoaeration and reduced pulmonary arterial supply through vaso constriction. In addition to the suprahilar mass with bandlike densities radiating from and distally, there is interstitial accentuation in this portion of the left upper lobe. Mild paraseptal emphysema.  Bilateral calcified pleural plaques. Musculoskeletal: Lower thoracic spondylosis. CT ABDOMEN PELVIS FINDINGS Hepatobiliary: Diffuse hepatic steatosis. Hepatic morphology raises the possibility of cirrhosis. No well-defined hepatic mass. Contracted gallbladder. Pancreas: I do not see differential enhancement in the uncinate process of the pancreas, this area was hypermetabolic on the prior exam concerning for potential focal malignancy. Spleen: Unremarkable Adrenals/Urinary Tract: Unremarkable Stomach/Bowel: Sigmoid diverticulosis. Vascular/Lymphatic: Aortoiliac atherosclerotic vascular disease. Small porta hepatis and retroperitoneal lymph nodes do not appear pathologically enlarged by size criteria. There is evidence of a thrombosed right femoral to distal graft. Significant narrowing of the proximal SFA bilaterally. Reproductive: Unremarkable Other: No supplemental non-categorized findings. Musculoskeletal: At the site of ill-defined metabolic activity in the left iliac bone lateral to the sacroiliac joint on the prior exam, there is currently some very faint asymmetric sclerosis as shown on image 101/2. This appears more conspicuous than on the prior PET-CT. IMPRESSION: 1. Marked reduction in size of the left upper lobe mass, currently  5.7 by 4.6 cm, previously 8.7 by 7.2 cm. The residual tumor extends around the left upper lobe tracheobronchial tree and narrows left upper lobe pulmonary arterial structures as well as the tracheobronchial tree. 2. At the site of the prior focal hypermetabolic activity  in the uncinate process of the pancreas, I do not see a distinct tumor. Occult tumor is certainly a possibility given the prior PET-CT findings. 3. At the site of prior ill-defined metabolic activity in the left iliac bone there is faint but asymmetric and increased sclerosis, probably reflecting treated osseous metastatic lesion. 4. Considerable atherosclerosis, with high-grade narrowing of the proximal superficial femoral arteries bilaterally. 5. Mild diffuse wall thickening of the esophagus suggesting esophagitis. 6. Other imaging findings of potential clinical significance: Paraseptal emphysema. Bilateral calcified pleural plaques. Diffuse hepatic steatosis with hepatic morphology raising the possibility of cirrhosis. Sigmoid diverticulosis. Electronically Signed   By: Van Clines M.D.   On: 11/04/2016 14:48   Dg Chest Portable 1 View  Result Date: 10/21/2016 CLINICAL DATA:  Left-sided lung carcinoma.  Atrial fibrillation. EXAM: PORTABLE CHEST 1 VIEW COMPARISON:  September 21, 2016 FINDINGS: Port-A-Cath tip is in the superior vena cava. No pneumothorax. There is again noted a mass in the left upper lobe which appears smaller compared to 1 month prior. There is volume loss in the left upper lobe. Lungs elsewhere clear. Heart is upper normal in size with pulmonary vascularity within normal limits. There is calcification along the left hemidiaphragm. No evident adenopathy. No bone lesions. IMPRESSION: Left upper lobe mass appears somewhat smaller and less well-defined compared to 1 month prior. Lungs elsewhere clear. Heart upper normal in size. Port-A-Cath tip in superior vena cava without pneumothorax. Calcification along left hemidiaphragm, likely due to previous asbestos exposure. Electronically Signed   By: Lowella Grip III M.D.   On: 10/21/2016 12:04    ASSESSMENT AND PLAN: This is a very pleasant 66 years old white male recently diagnosed with extensive stage small cell lung cancer and  currently undergoing systemic chemotherapy with carboplatin and etoposide in addition to concurrent radiotherapy to the lung mass. He is status post 2 cycles of treatment and tolerated his chemotherapy fairly well. The patient had repeat CT scan of the chest, abdomen and pelvis performed recently. I personally and independently reviewed the scan images and discuss the results and showed the images to the patient and his wife. His scan showed significant improvement in his disease. I recommended for the patient to continue his current treatment with carboplatin and etoposide and he will proceed with cycle #3 today. I will see him back for follow-up visit in 3 weeks for evaluation before starting cycle #4. For the chemotherapy-induced anemia, I will arrange for the patient to receive 2 units of PRBCs transfusion. For hypertension the patient will continue with his current treatment with Cardizem CD and Toprol-XL. For diabetes mellitus, he will continue his current treatment with Actos, and Amaryl He was advised to call immediately if he has any concerning symptoms in the interval. The patient voices understanding of current disease status and treatment options and is in agreement with the current care plan. All questions were answered. The patient knows to call the clinic with any problems, questions or concerns. We can certainly see the patient much sooner if necessary.  Disclaimer: This note was dictated with voice recognition software. Similar sounding words can inadvertently be transcribed and may not be corrected upon review.

## 2016-11-05 NOTE — Telephone Encounter (Signed)
Added additional appts per 11/05/2016 los. - patient to pick up new schedule next visit.

## 2016-11-06 ENCOUNTER — Other Ambulatory Visit: Payer: BLUE CROSS/BLUE SHIELD

## 2016-11-06 ENCOUNTER — Encounter (HOSPITAL_COMMUNITY): Payer: BLUE CROSS/BLUE SHIELD

## 2016-11-06 ENCOUNTER — Ambulatory Visit (HOSPITAL_BASED_OUTPATIENT_CLINIC_OR_DEPARTMENT_OTHER): Payer: BLUE CROSS/BLUE SHIELD

## 2016-11-06 ENCOUNTER — Ambulatory Visit
Admission: RE | Admit: 2016-11-06 | Discharge: 2016-11-06 | Disposition: A | Discharge status: Home / Self Care | Payer: BLUE CROSS/BLUE SHIELD | Source: Ambulatory Visit | Attending: Radiation Oncology | Admitting: Radiation Oncology

## 2016-11-06 VITALS — BP 137/79 | HR 82 | Temp 98.8°F | Resp 16

## 2016-11-06 DIAGNOSIS — D649 Anemia, unspecified: Secondary | ICD-10-CM | POA: Diagnosis not present

## 2016-11-06 DIAGNOSIS — C3492 Malignant neoplasm of unspecified part of left bronchus or lung: Secondary | ICD-10-CM

## 2016-11-06 DIAGNOSIS — C3412 Malignant neoplasm of upper lobe, left bronchus or lung: Secondary | ICD-10-CM | POA: Diagnosis not present

## 2016-11-06 DIAGNOSIS — Z510 Encounter for antineoplastic radiation therapy: Secondary | ICD-10-CM | POA: Diagnosis not present

## 2016-11-06 DIAGNOSIS — Z5111 Encounter for antineoplastic chemotherapy: Secondary | ICD-10-CM

## 2016-11-06 LAB — PREPARE RBC (CROSSMATCH)

## 2016-11-06 MED ORDER — SODIUM CHLORIDE 0.9 % IV SOLN: 250.0000 mL | Freq: Once | INTRAVENOUS | Status: AC

## 2016-11-06 MED ORDER — ACETAMINOPHEN 325 MG PO TABS
650.0000 mg | ORAL_TABLET | Freq: Once | ORAL | Status: AC
  Administered 2016-11-06: 650 mg via ORAL

## 2016-11-06 MED ORDER — SODIUM CHLORIDE 0.9% FLUSH
10.0000 mL | INTRAVENOUS | Status: DC | PRN
  Filled 2016-11-06: qty 10

## 2016-11-06 MED ORDER — DIPHENHYDRAMINE HCL 25 MG PO CAPS
25.0000 mg | ORAL_CAPSULE | Freq: Once | ORAL | Status: AC
  Administered 2016-11-06: 25 mg via ORAL

## 2016-11-06 MED ORDER — SODIUM CHLORIDE 0.9 % IV SOLN
100.0000 mg/m2 | Freq: Once | INTRAVENOUS | Status: AC
  Administered 2016-11-06: 230 mg via INTRAVENOUS
  Filled 2016-11-06: qty 11.5

## 2016-11-06 MED ORDER — DEXAMETHASONE SODIUM PHOSPHATE 10 MG/ML IJ SOLN
10.0000 mg | Freq: Once | INTRAMUSCULAR | Status: AC
  Administered 2016-11-06: 10 mg via INTRAVENOUS

## 2016-11-06 MED ORDER — SODIUM CHLORIDE 0.9 % IV SOLN
Freq: Once | INTRAVENOUS | Status: AC
  Administered 2016-11-06: 11:00:00 via INTRAVENOUS

## 2016-11-06 MED ORDER — ACETAMINOPHEN 325 MG PO TABS
ORAL_TABLET | ORAL | Status: AC
  Filled 2016-11-06: qty 2

## 2016-11-06 MED ORDER — SODIUM CHLORIDE 0.9% FLUSH
10.0000 mL | INTRAVENOUS | Status: AC | PRN
  Administered 2016-11-06: 10 mL
  Filled 2016-11-06: qty 10

## 2016-11-06 MED ORDER — HEPARIN SOD (PORK) LOCK FLUSH 100 UNIT/ML IV SOLN
500.0000 [IU] | Freq: Every day | INTRAVENOUS | Status: AC | PRN
  Administered 2016-11-06: 500 [IU]
  Filled 2016-11-06: qty 5

## 2016-11-06 MED ORDER — DEXAMETHASONE SODIUM PHOSPHATE 10 MG/ML IJ SOLN
INTRAMUSCULAR | Status: AC
  Filled 2016-11-06: qty 1

## 2016-11-06 MED ORDER — DIPHENHYDRAMINE HCL 25 MG PO CAPS
ORAL_CAPSULE | ORAL | Status: AC
  Filled 2016-11-06: qty 1

## 2016-11-06 MED ORDER — HEPARIN SOD (PORK) LOCK FLUSH 100 UNIT/ML IV SOLN
500.0000 [IU] | Freq: Once | INTRAVENOUS | Status: DC | PRN
  Filled 2016-11-06: qty 5

## 2016-11-06 NOTE — Patient Instructions (Addendum)
[facility] Discharge Instructions for Patients Receiving Chemotherapy  Today you received the following chemotherapy agents:  Etoposide.  To help prevent nausea and vomiting after your treatment, we encourage you to take your nausea medication as directed.   If you develop nausea and vomiting that is not controlled by your nausea medication, call the clinic.   BELOW ARE SYMPTOMS THAT SHOULD BE REPORTED IMMEDIATELY:  *FEVER GREATER THAN 100.5 F  *CHILLS WITH OR WITHOUT FEVER  NAUSEA AND VOMITING THAT IS NOT CONTROLLED WITH YOUR NAUSEA MEDICATION  *UNUSUAL SHORTNESS OF BREATH  *UNUSUAL BRUISING OR BLEEDING  TENDERNESS IN MOUTH AND THROAT WITH OR WITHOUT PRESENCE OF ULCERS  *URINARY PROBLEMS  *BOWEL PROBLEMS  UNUSUAL RASH Items with * indicate a potential emergency and should be followed up as soon as possible.  Feel free to call the clinic you have any questions or concerns. The clinic phone number is [phone].  Please show the [facility] at check-in to the Emergency Department and triage nurse.   Blood Transfusion , Adult A blood transfusion is a procedure in which you receive donated blood, including plasma, platelets, and red blood cells, through an IV tube. You may need a blood transfusion because of illness, surgery, or injury. The blood may come from a donor. You may also be able to donate blood for yourself (autologous blood donation) before a surgery if you know that you might require a blood transfusion. The blood given in a transfusion is made up of different types of cells. You may receive:  Red blood cells. These carry oxygen to the cells in the body.  White blood cells. These help you fight infections.  Platelets. These help your blood to clot.  Plasma. This is the liquid part of your blood and it helps with fluid imbalances. If you have hemophilia or another clotting disorder, you may also receive other types of blood  products. Tell a health care provider about:  Any allergies you have.  All medicines you are taking, including vitamins, herbs, eye drops, creams, and over-the-counter medicines.  Any problems you or family members have had with anesthetic medicines.  Any blood disorders you have.  Any surgeries you have had.  Any medical conditions you have, including any recent fever or cold symptoms.  Whether you are pregnant or may be pregnant.  Any previous reactions you have had during a blood transfusion. What are the risks? Generally, this is a safe procedure. However, problems may occur, including:  Having an allergic reaction to something in the donated blood. Hives and itching may be symptoms of this type of reaction.  Fever. This may be a reaction to the white blood cells in the transfused blood. Nausea or chest pain may accompany a fever.  Iron overload. This can happen from having many transfusions.  Transfusion-related acute lung injury (TRALI). This is a rare reaction that causes lung damage. The cause is not known.TRALI can occur within hours of a transfusion or several days later.  Sudden (acute) or delayed hemolytic reactions. This happens if your blood does not match the cells in your transfusion. Your body's defense system (immune system) may try to attack the new cells. This complication is rare. The symptoms include fever, chills, nausea, and low back pain or chest pain.  Infection or disease transmission. This is rare. What happens before the procedure?  You will have a blood test to determine your blood type. This is necessary to know what kind of blood  your body will accept and to match it to the donor blood.  If you are going to have a planned surgery, you may be able to do an autologous blood donation. This may be done in case you need to have a transfusion.  If you have had an allergic reaction to a transfusion in the past, you may be given medicine to help prevent  a reaction. This medicine may be given to you by mouth or through an IV tube.  You will have your temperature, blood pressure, and pulse monitored before the transfusion.  Follow instructions from your health care provider about eating and drinking restrictions.  Ask your health care provider about:  Changing or stopping your regular medicines. This is especially important if you are taking diabetes medicines or blood thinners.  Taking medicines such as aspirin and ibuprofen. These medicines can thin your blood. Do not take these medicines before your procedure if your health care provider instructs you not to. What happens during the procedure?  An IV tube will be inserted into one of your veins.  The bag of donated blood will be attached to your IV tube. The blood will then enter through your vein.  Your temperature, blood pressure, and pulse will be monitored regularly during the transfusion. This monitoring is done to detect early signs of a transfusion reaction.  If you have any signs or symptoms of a reaction, your transfusion will be stopped and you may be given medicine.  When the transfusion is complete, your IV tube will be removed.  Pressure may be applied to the IV site for a few minutes.  A bandage (dressing) will be applied. The procedure may vary among health care providers and hospitals. What happens after the procedure?  Your temperature, blood pressure, heart rate, breathing rate, and blood oxygen level will be monitored often.  Your blood may be tested to see how you are responding to the transfusion.  You may be warmed with fluids or blankets to maintain a normal body temperature. Summary  A blood transfusion is a procedure in which you receive donated blood, including plasma, platelets, and red blood cells, through an IV tube.  Your temperature, blood pressure, and pulse will be monitored before, during, and after the transfusion.  Your blood may be tested  after the transfusion to see how your body has responded. This information is not intended to replace advice given to you by your health care provider. Make sure you discuss any questions you have with your health care provider. Document Released: 07/18/2000 Document Revised: 04/17/2016 Document Reviewed: 04/17/2016 Elsevier Interactive Patient Education  2017 [employer].

## 2016-11-07 ENCOUNTER — Ambulatory Visit: Payer: BLUE CROSS/BLUE SHIELD

## 2016-11-07 ENCOUNTER — Ambulatory Visit
Admission: RE | Admit: 2016-11-07 | Discharge: 2016-11-07 | Disposition: A | Discharge status: Home / Self Care | Payer: BLUE CROSS/BLUE SHIELD | Source: Ambulatory Visit | Attending: Radiation Oncology | Admitting: Radiation Oncology

## 2016-11-07 ENCOUNTER — Other Ambulatory Visit: Payer: BLUE CROSS/BLUE SHIELD

## 2016-11-07 ENCOUNTER — Ambulatory Visit (HOSPITAL_BASED_OUTPATIENT_CLINIC_OR_DEPARTMENT_OTHER): Payer: BLUE CROSS/BLUE SHIELD

## 2016-11-07 VITALS — BP 138/74 | HR 73 | Temp 97.6°F | Resp 20

## 2016-11-07 DIAGNOSIS — Z5111 Encounter for antineoplastic chemotherapy: Secondary | ICD-10-CM | POA: Diagnosis not present

## 2016-11-07 DIAGNOSIS — C3492 Malignant neoplasm of unspecified part of left bronchus or lung: Secondary | ICD-10-CM

## 2016-11-07 DIAGNOSIS — C3412 Malignant neoplasm of upper lobe, left bronchus or lung: Secondary | ICD-10-CM

## 2016-11-07 DIAGNOSIS — Z510 Encounter for antineoplastic radiation therapy: Secondary | ICD-10-CM | POA: Diagnosis not present

## 2016-11-07 LAB — BPAM RBC
BLOOD PRODUCT EXPIRATION DATE: 201804242359
Blood Product Expiration Date: 201804232359
ISSUE DATE / TIME: 201804051203
ISSUE DATE / TIME: 201804051203
UNIT TYPE AND RH: 5100
Unit Type and Rh: 5100

## 2016-11-07 LAB — TYPE AND SCREEN
ABO/RH(D): O POS
ANTIBODY SCREEN: NEGATIVE
UNIT DIVISION: 0
UNIT DIVISION: 0

## 2016-11-07 MED ORDER — DEXAMETHASONE SODIUM PHOSPHATE 10 MG/ML IJ SOLN
10.0000 mg | Freq: Once | INTRAMUSCULAR | Status: AC
  Administered 2016-11-07: 10 mg via INTRAVENOUS

## 2016-11-07 MED ORDER — HEPARIN SOD (PORK) LOCK FLUSH 100 UNIT/ML IV SOLN
500.0000 [IU] | Freq: Once | INTRAVENOUS | Status: AC | PRN
  Administered 2016-11-07: 500 [IU]
  Filled 2016-11-07: qty 5

## 2016-11-07 MED ORDER — SODIUM CHLORIDE 0.9 % IV SOLN
100.0000 mg/m2 | Freq: Once | INTRAVENOUS | Status: AC
  Administered 2016-11-07: 230 mg via INTRAVENOUS
  Filled 2016-11-07: qty 11.5

## 2016-11-07 MED ORDER — SODIUM CHLORIDE 0.9% FLUSH
10.0000 mL | INTRAVENOUS | Status: DC | PRN
Start: 2016-11-07 — End: 2016-11-07
  Administered 2016-11-07: 10 mL
  Filled 2016-11-07: qty 10

## 2016-11-07 MED ORDER — DEXAMETHASONE SODIUM PHOSPHATE 10 MG/ML IJ SOLN
INTRAMUSCULAR | Status: AC
  Filled 2016-11-07: qty 1

## 2016-11-07 MED ORDER — SODIUM CHLORIDE 0.9 % IV SOLN
Freq: Once | INTRAVENOUS | Status: AC
  Administered 2016-11-07: 11:00:00 via INTRAVENOUS

## 2016-11-07 NOTE — Patient Instructions (Signed)
[facility] Discharge Instructions for Patients Receiving Chemotherapy  Today you received the following chemotherapy agents Etoposide.   To help prevent nausea and vomiting after your treatment, we encourage you to take your nausea medication as prescribed.   If you develop nausea and vomiting that is not controlled by your nausea medication, call the clinic.   BELOW ARE SYMPTOMS THAT SHOULD BE REPORTED IMMEDIATELY:  *FEVER GREATER THAN 100.5 F  *CHILLS WITH OR WITHOUT FEVER  NAUSEA AND VOMITING THAT IS NOT CONTROLLED WITH YOUR NAUSEA MEDICATION  *UNUSUAL SHORTNESS OF BREATH  *UNUSUAL BRUISING OR BLEEDING  TENDERNESS IN MOUTH AND THROAT WITH OR WITHOUT PRESENCE OF ULCERS  *URINARY PROBLEMS  *BOWEL PROBLEMS  UNUSUAL RASH Items with * indicate a potential emergency and should be followed up as soon as possible.  Feel free to call the clinic you have any questions or concerns. The clinic phone number is [phone].  Please show the CHEMO ALERT CARD at check-in to the Emergency Department and triage nurse.   

## 2016-11-08 NOTE — Progress Notes (Signed)
Cardiology Office Note  Date:  11/11/2016   ID:  Jesse Avila, DOB 08-Feb-1951, MRN [id number]  PCP:  Simona Huh, MD   Chief Complaint  Patient presents with  . other    Hospital follow up.  Pt c/o fatigue, not sure if it is due to medications.  Meds reviewed verbally with patient.    HPI:  66 year old male Withhistory of PAD,  Carotid dz,  HTN,  hyperlipidemia,  DM II,  Smoking hx, Atrial fibrillation, CHADSVASC score of 5.seen in hospital 10/2016, converted to NSR Started on eliquis, metoprolol and diltiazem Hx of CVA, mild residual left-sided weakness COPD He presents for follow-up of his recent hospitalization and new diagnosis of atrial fibrillation First time in the [facility] cardiology office  Recent hospitalization March 2018 seen by cardiology for new atrial fibrillation Hospital records reviewed personally by myself Started on diltiazem infusion and converted to sinus rhythm  small cell lung cancer 09/02/16   large left upper lobe lung mass with mediastinal invasion, pancreatic lesion and suspicious left iliac bone metastasis diagnosed in February 2018.  Undergoing Systemic chemotherapy with carbo platinum, etoposide,  radiation to the large lung mass (done with XRT)  CT chest: Coronary, aortic arch, and branch vessel atherosclerotic vascular disease. high-grade narrowing of the proximal superficial femoral arteries bilaterally.  echo3/21/18 Normal LV size with EF 50%. Basal inferior and inferoseptal   severe hypokinesis. Normal RV size and systolic function. No   significant valvular abnormalities.  Since discharge from the hospital he has felt tired He had 2 units of blood given for hemoglobin of 7 No check since that time in the computer system Otherwise no complaints Reports he is asymptomatic from his atrial fibrillation. Not monitoring blood pressure or heart rate at home  Lab work reviewed with him in detail showing hemoglobin A1c 8.1, creatinine  1.18, total cholesterol 178 with LDL 100 left  EKG personally reviewed by myself on todays visit Shows normal sinus rhythm with rate 68 bpm no significant ST or T-wave changes  PMH:   has a past medical history of Carotid artery occlusion; DJD (degenerative joint disease) of knee; Essential hypertension, benign; Goals of care, counseling/discussion (09/18/2016); History of colon polyps; History of gout; Hyperlipidemia; Insomnia; Joint pain; Lung cancer ([place]); Nocturia; Peripheral vascular disease ([place]); Primary localized osteoarthritis of left knee (12/26/2014); Stroke [facility]) (2015?); and 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  PSH:    Past Surgical History:  Procedure Laterality Date  . ABDOMINAL AORTAGRAM N/A 03/29/2012   Procedure: ABDOMINAL Maxcine Ham;  Surgeon: Angelia Mould, MD;  Location: [facility] CATH LAB;  Service: Cardiovascular;  Laterality: N/A;  . COLONOSCOPY    . ENDARTERECTOMY Right 05/09/2014   Procedure: RIGHT CAROTID ENDARTERECTOMY WITH PATCH ANGIOPLASTY;  Surgeon: Conrad , MD;  Location: [facility];  Service: Vascular;  Laterality: Right;  . ENDARTERECTOMY FEMORAL Right 05/17/2015   Procedure: ENDARTERECTOMY FEMORAL;  Surgeon: Angelia Mould, MD;  Location: [place];  Service: Vascular;  Laterality: Right;  . ENDOBRONCHIAL ULTRASOUND Bilateral 09/08/2016   Procedure: ENDOBRONCHIAL ULTRASOUND;  Surgeon: Rigoberto Noel, MD;  Location: [facility] ENDOSCOPY;  Service: Cardiopulmonary;  Laterality: Bilateral;  . FEMORAL-POPLITEAL BYPASS GRAFT Right 05/17/2015   Procedure: BYPASS GRAFT FEMORAL-BELOW KNEE POPLITEAL ARTERY, using gortex propaten graft 6 mm x 80 cm;  Surgeon: Angelia Mould, MD;  Location: [facility] OR;  Service: Vascular;  Laterality: Right;  . IR GENERIC HISTORICAL  09/25/2016   IR FLUORO  GUIDE PORT INSERTION RIGHT 09/25/2016 Markus Daft, MD [facility]-INTERV RAD  . IR GENERIC HISTORICAL  09/25/2016   IR US  GUIDE VASC ACCESS RIGHT 09/25/2016 Markus Daft, MD [facility]-INTERV RAD  . KNEE ARTHROSCOPY Right 11/2009  . LOWER EXTREMITY ANGIOGRAM Bilateral 03/23/2015   Procedure: Lower Extremity Angiogram;  Surgeon: Angelia Mould, MD;  Location: [facility] CV LAB;  Service: Cardiovascular;  Laterality: Bilateral;  . MENISCUS REPAIR  11/2009  . PARTIAL KNEE ARTHROPLASTY Left 12/26/2014   Procedure: UNICOMPARTMENTAL KNEE;  Surgeon: Marchia Bond, MD;  Location: [place];  Service: Orthopedics;  Laterality: Left;  . PATCH ANGIOPLASTY Right 05/17/2015   Procedure: VEIN PATCH ANGIOPLASTY ILEOFEMORAL ARTERY;  Surgeon: Angelia Mould, MD;  Location: [facility];  Service: Vascular;  Laterality: Right;  . PERCUTANEOUS STENT INTERVENTION N/A 05/10/2012   Procedure: PERCUTANEOUS STENT INTERVENTION;  Surgeon: Angelia Mould, MD;  Location: [facility] CATH LAB;  Service: Cardiovascular;  Laterality: N/A;  . PERIPHERAL VASCULAR CATHETERIZATION N/A 03/23/2015   Procedure: Abdominal Aortogram;  Surgeon: Angelia Mould, MD;  Location: [place] CV LAB;  Service: Cardiovascular;  Laterality: N/A;  . STENTS     PLACED IN ??BOTH LEGS   2013?    Current Outpatient Prescriptions  Medication Sig Dispense Refill  . apixaban (ELIQUIS) 5 MG TABS tablet Take 1 tablet (5 mg total) by mouth 2 (two) times daily. 60 tablet 3  . Blood Glucose Monitoring Suppl (ACCU-CHEK NANO SMARTVIEW) w/Device KIT by Does not apply route.    . diltiazem (CARDIZEM CD) 120 MG 24 hr capsule Take 1 capsule (120 mg total) by mouth daily. 30 capsule 2  . emollient (BIAFINE) cream Apply topically 2 (two) times daily.    . fenofibrate 160 MG tablet Take 160 mg by mouth daily.    Marland Kitchen glimepiride (AMARYL) 4 MG tablet Take 4 mg by mouth daily with breakfast.    . lidocaine-prilocaine (EMLA) cream Apply 1 application topically as needed. 30 g 0  . metFORMIN (GLUCOPHAGE) 500 MG tablet Take 1 tablet (500 mg total) by mouth 3 (three) times daily.    . metoprolol  succinate (TOPROL-XL) 50 MG 24 hr tablet Take 50 mg by mouth daily. Take with or immediately following a meal.    . pioglitazone (ACTOS) 15 MG tablet Take 15 mg by mouth daily.    . prochlorperazine (COMPAZINE) 10 MG tablet Take 1 tablet (10 mg total) by mouth every 6 (six) hours as needed for nausea or vomiting. 30 tablet 0  . rosuvastatin (CRESTOR) 40 MG tablet Take 40 mg by mouth every evening. For cholesterol    . zolpidem (AMBIEN) 10 MG tablet Take 10 mg by mouth at bedtime as needed for sleep.      No current facility-administered medications for this visit.    Facility-Administered Medications Ordered in Other Visits  Medication Dose Route Frequency Provider Last Rate Last Dose  . heparin lock flush 100 unit/mL  500 Units Intracatheter Once PRN Curt Bears, MD      . sodium chloride flush (NS) 0.9 % injection 10 mL  10 mL Intracatheter PRN Curt Bears, MD   10 mL at 10/13/16 1618  . sodium chloride flush (NS) 0.9 % injection 10 mL  10 mL Intracatheter PRN Curt Bears, MD         Allergies:   Cialis [tadalafil]   Social History:  The patient  reports that he has quit smoking. He smoked 0.00 packs per day for 0.00 years. He has never used  smokeless tobacco. He reports that he drinks alcohol. He reports that he does not use drugs.   Family History:   family history includes Diabetes in his mother; Heart attack in his mother; Heart disease in his mother; Hyperlipidemia in his mother and sister; Hypertension in his father and mother; Stroke in his sister.    Review of Systems: Review of Systems  Constitutional: Positive for malaise/fatigue.  Respiratory: Negative.   Cardiovascular: Negative.   Gastrointestinal: Negative.   Musculoskeletal: Negative.   Neurological: Negative.   Psychiatric/Behavioral: Negative.   All other systems reviewed and are negative.    PHYSICAL EXAM: VS:  BP (!) 110/58 (BP Location: Left Arm, Patient Position: Sitting, Cuff Size: Normal)    Pulse 68   Ht '6\' 1"'$  (1.854 m)   Wt 215 lb 12 oz (97.9 kg)   BMI 28.46 kg/m  , BMI Body mass index is 28.46 kg/m. GEN: Well nourished, well developed, in no acute distress  HEENT: normal  Neck: no JVD, carotid bruits, or masses Cardiac: RRR; no murmurs, rubs, or gallops,no edema  Respiratory:  clear to auscultation bilaterally, normal work of breathing GI: soft, nontender, nondistended, + BS MS: no deformity or atrophy  Skin: warm and dry, no rash Neuro:  Strength and sensation are intact Psych: euthymic mood, full affect    Recent Labs: 10/21/2016: TSH 0.234 10/22/2016: Magnesium 1.8 11/05/2016: ALT 19; BUN 10.0; Creatinine 1.0; HGB 7.3 Repeated and Verified; Platelets 202; Potassium 3.6; Sodium 138    Lipid Panel Lab Results  Component Value Date   CHOL 121 10/22/2016   HDL 24 (L) 10/22/2016   LDLCALC 53 10/22/2016   TRIG 219 (H) 10/22/2016      Wt Readings from Last 3 Encounters:  11/11/16 215 lb 12 oz (97.9 kg)  11/05/16 217 lb 12.8 oz (98.8 kg)  11/04/16 218 lb 6.4 oz (99.1 kg)       ASSESSMENT AND PLAN:  Essential hypertension, benign - Plan: EKG 12-Lead Blood pressure is well controlled on today's visit. No changes made to the medications. Denies any orthostasis symptoms If he has weight loss, low blood pressure at home recommended he call our office  Peripheral vascular disease ([place]) - Plan: EKG 12-Lead He reports having lower extremity arterial stenting We will review Procedure notes for our records  Mixed hyperlipidemia - Plan: EKG 12-Lead LDL slightly above goal, will not make any changes at this Time given likely continued weight loss having chemotherapy  Paroxysmal atrial fibrillation ([place]) - Plan: EKG 12-Lead Maintaining normal sinus rhythm, we'll continue metoprolol and diltiazem Recommended he stay on anticoagulation at this time  Type 2 diabetes mellitus with other circulatory complication, unspecified long term insulin use status ([place]) - Plan:  EKG 12-Lead Hemoglobin A1c previously elevated, 8 Recent weight loss undergoing chemotherapy  Chronic obstructive pulmonary disease, unspecified COPD type ([place]) - Plan: EKG 12-Lead Stable, denies any significant shortness of breath  Small cell lung carcinoma, left ([place]) - Plan: EKG 12-Lead Finished radiation per the patient, undergoing chemotherapy  Malignant neoplasm of hilus of left lung ([place]) - Plan: EKG 12-Lead  Anemia, unspecified type Recently received 2 units packed red blood cells, still with fatigue will need to monitor as she is now on anticoagulation   Long discussion with him concerning details as above, recent hospitalization All questions answered Reviewed medications in detail with him  Total encounter time more than 45 minutes  Greater than 50% was spent in counseling and coordination of care with the patient  Disposition:   F/U  3 months   Orders Placed This Encounter  Procedures  . EKG 12-Lead     Signed, Esmond Plants, M.D., Ph.D. 11/11/2016  [place], [place]

## 2016-11-10 ENCOUNTER — Ambulatory Visit
Admission: RE | Admit: 2016-11-10 | Discharge: 2016-11-10 | Disposition: A | Discharge status: Home / Self Care | Payer: BLUE CROSS/BLUE SHIELD | Source: Ambulatory Visit | Attending: Radiation Oncology | Admitting: Radiation Oncology

## 2016-11-10 ENCOUNTER — Ambulatory Visit (HOSPITAL_BASED_OUTPATIENT_CLINIC_OR_DEPARTMENT_OTHER): Payer: BLUE CROSS/BLUE SHIELD

## 2016-11-10 VITALS — BP 94/55 | HR 84 | Temp 98.1°F | Resp 18

## 2016-11-10 DIAGNOSIS — Z5189 Encounter for other specified aftercare: Secondary | ICD-10-CM | POA: Diagnosis not present

## 2016-11-10 DIAGNOSIS — C3412 Malignant neoplasm of upper lobe, left bronchus or lung: Secondary | ICD-10-CM

## 2016-11-10 DIAGNOSIS — Z510 Encounter for antineoplastic radiation therapy: Secondary | ICD-10-CM | POA: Diagnosis not present

## 2016-11-10 DIAGNOSIS — C3492 Malignant neoplasm of unspecified part of left bronchus or lung: Secondary | ICD-10-CM

## 2016-11-10 MED ORDER — PEGFILGRASTIM INJECTION 6 MG/0.6ML ~~LOC~~
6.0000 mg | PREFILLED_SYRINGE | Freq: Once | SUBCUTANEOUS | Status: AC
  Administered 2016-11-10: 6 mg via SUBCUTANEOUS
  Filled 2016-11-10: qty 0.6

## 2016-11-11 ENCOUNTER — Ambulatory Visit (INDEPENDENT_AMBULATORY_CARE_PROVIDER_SITE_OTHER): Payer: BLUE CROSS/BLUE SHIELD | Admitting: Cardiovascular Disease

## 2016-11-11 VITALS — BP 110/58 | HR 68 | Ht 73.0 in | Wt 215.8 lb

## 2016-11-11 DIAGNOSIS — E1159 Type 2 diabetes mellitus with other circulatory complications: Secondary | ICD-10-CM

## 2016-11-11 DIAGNOSIS — D649 Anemia, unspecified: Secondary | ICD-10-CM | POA: Diagnosis not present

## 2016-11-11 DIAGNOSIS — C3402 Malignant neoplasm of left main bronchus: Secondary | ICD-10-CM | POA: Diagnosis not present

## 2016-11-11 DIAGNOSIS — I480 Paroxysmal atrial fibrillation: Secondary | ICD-10-CM | POA: Diagnosis not present

## 2016-11-11 DIAGNOSIS — I10 Essential (primary) hypertension: Secondary | ICD-10-CM | POA: Diagnosis not present

## 2016-11-11 DIAGNOSIS — J449 Chronic obstructive pulmonary disease, unspecified: Secondary | ICD-10-CM

## 2016-11-11 DIAGNOSIS — E782 Mixed hyperlipidemia: Secondary | ICD-10-CM

## 2016-11-11 DIAGNOSIS — I739 Peripheral vascular disease, unspecified: Secondary | ICD-10-CM | POA: Diagnosis not present

## 2016-11-11 DIAGNOSIS — C3492 Malignant neoplasm of unspecified part of left bronchus or lung: Secondary | ICD-10-CM | POA: Diagnosis not present

## 2016-11-11 NOTE — Addendum Note (Signed)
Addended by: Minna Merritts on: 11/11/2016 09:26 AM   Modules accepted: Level of Service

## 2016-11-11 NOTE — Patient Instructions (Addendum)
Medication Instructions:   No medication changes made  Please monitor pulse rate and blood pressure at home   Labwork:  No new labs needed  Testing/Procedures:  No further testing at this time   I recommend watching educational videos on topics of interest to you at:       [email or web address]  Enter code: HEARTCARE    Follow-Up: It was a pleasure seeing you in the office today. Please call us if you have new issues that need to be addressed before your next appt.  [phone]  Your physician wants you to follow-up in: 3 months.  You will receive a reminder letter in the mail two months in advance. If you don't receive a letter, please call our office to schedule the follow-up appointment.  If you need a refill on your cardiac medications before your next appointment, please call your pharmacy.

## 2016-11-12 ENCOUNTER — Encounter: Payer: Self-pay | Admitting: Radiation Oncology

## 2016-11-12 ENCOUNTER — Other Ambulatory Visit (HOSPITAL_BASED_OUTPATIENT_CLINIC_OR_DEPARTMENT_OTHER): Payer: BLUE CROSS/BLUE SHIELD

## 2016-11-12 DIAGNOSIS — Z7189 Other specified counseling: Secondary | ICD-10-CM

## 2016-11-12 DIAGNOSIS — I10 Essential (primary) hypertension: Secondary | ICD-10-CM

## 2016-11-12 DIAGNOSIS — C3492 Malignant neoplasm of unspecified part of left bronchus or lung: Secondary | ICD-10-CM

## 2016-11-12 DIAGNOSIS — I739 Peripheral vascular disease, unspecified: Secondary | ICD-10-CM

## 2016-11-12 DIAGNOSIS — C3412 Malignant neoplasm of upper lobe, left bronchus or lung: Secondary | ICD-10-CM | POA: Diagnosis not present

## 2016-11-12 LAB — COMPREHENSIVE METABOLIC PANEL
ALBUMIN: 3.6 g/dL (ref 3.5–5.0)
ALK PHOS: 71 U/L (ref 40–150)
ALT: 20 U/L (ref 0–55)
ANION GAP: 10 meq/L (ref 3–11)
AST: 13 U/L (ref 5–34)
BILIRUBIN TOTAL: 0.87 mg/dL (ref 0.20–1.20)
BUN: 22.8 mg/dL (ref 7.0–26.0)
CO2: 25 meq/L (ref 22–29)
Calcium: 9.5 mg/dL (ref 8.4–10.4)
Chloride: 103 mEq/L (ref 98–109)
Creatinine: 1 mg/dL (ref 0.7–1.3)
EGFR: 76 mL/min/{1.73_m2} — AB (ref >90)
GLUCOSE: 202 mg/dL — AB (ref 70–140)
Potassium: 4.3 mEq/L (ref 3.5–5.1)
SODIUM: 137 meq/L (ref 136–145)
TOTAL PROTEIN: 6.6 g/dL (ref 6.4–8.3)

## 2016-11-12 LAB — CBC WITH DIFFERENTIAL/PLATELET
BASO%: 0.2 % (ref 0.0–2.0)
Basophils Absolute: 0 10*3/uL (ref 0.0–0.1)
EOS%: 0.5 % (ref 0.0–7.0)
Eosinophils Absolute: 0 10*3/uL (ref 0.0–0.5)
HEMATOCRIT: 28.4 % — AB (ref 38.4–49.9)
HGB: 9.5 g/dL — ABNORMAL LOW (ref 13.0–17.1)
LYMPH%: 9.3 % — AB (ref 14.0–49.0)
MCH: 30.4 pg (ref 27.2–33.4)
MCHC: 33.5 g/dL (ref 32.0–36.0)
MCV: 91 fL (ref 79.3–98.0)
MONO#: 0 10*3/uL — AB (ref 0.1–0.9)
MONO%: 0.9 % (ref 0.0–14.0)
NEUT%: 89.1 % — AB (ref 39.0–75.0)
NEUTROS ABS: 3.8 10*3/uL (ref 1.5–6.5)
PLATELETS: 111 10*3/uL — AB (ref 140–400)
RBC: 3.12 10*6/uL — AB (ref 4.20–5.82)
RDW: 15.8 % — ABNORMAL HIGH (ref 11.0–14.6)
WBC: 4.3 10*3/uL (ref 4.0–10.3)
lymph#: 0.4 10*3/uL — ABNORMAL LOW (ref 0.9–3.3)
nRBC: 0 % (ref 0–0)

## 2016-11-12 NOTE — Progress Notes (Signed)
  Radiation Oncology         [phone] ________________________________  Name: Jesse Avila MRN: [id number]  Date: 11/12/2016  DOB: Jun 11, 1951  End of Treatment Note  Diagnosis:  Limited versus "early" extensive stage small cell lung cancer (T4, N2, M1b)    Indication for treatment: Curative, along with chemotherapy    Radiation treatment dates:  09/30/16-11/10/16  Site/dose:   Left lung/ 60 Gy in 30 fractions  Beams/energy:   3D / 10X, 6X  Narrative: The patient tolerated radiation treatment relatively well.  During treatment the patient complained of malaise, and an occasional dry cough. He was diagnosed with new onset atrial fibrillation during the course of his radiation treatments.  Plan: The patient has completed radiation treatment. The patient will return to radiation oncology clinic for routine followup in one month. I advised them to call or return sooner if they have any questions or concerns related to their recovery or treatment.  -----------------------------------  Blair Promise, PhD, MD  This document serves as a record of services personally performed by Gery Pray, MD. It was created on his behalf by Bethann Humble, a trained medical scribe. The creation of this record is based on the scribe's personal observations and the provider's statements to them. This document has been checked and approved by the attending provider.

## 2016-11-17 ENCOUNTER — Encounter: Payer: Self-pay | Admitting: Cardiology

## 2016-11-19 ENCOUNTER — Other Ambulatory Visit (HOSPITAL_BASED_OUTPATIENT_CLINIC_OR_DEPARTMENT_OTHER): Payer: BLUE CROSS/BLUE SHIELD

## 2016-11-19 DIAGNOSIS — I739 Peripheral vascular disease, unspecified: Secondary | ICD-10-CM

## 2016-11-19 DIAGNOSIS — Z7189 Other specified counseling: Secondary | ICD-10-CM

## 2016-11-19 DIAGNOSIS — C3412 Malignant neoplasm of upper lobe, left bronchus or lung: Secondary | ICD-10-CM

## 2016-11-19 DIAGNOSIS — I10 Essential (primary) hypertension: Secondary | ICD-10-CM

## 2016-11-19 DIAGNOSIS — C3492 Malignant neoplasm of unspecified part of left bronchus or lung: Secondary | ICD-10-CM

## 2016-11-19 LAB — CBC WITH DIFFERENTIAL/PLATELET
BASO%: 0.3 % (ref 0.0–2.0)
BASOS ABS: 0 10*3/uL (ref 0.0–0.1)
EOS%: 0.8 % (ref 0.0–7.0)
Eosinophils Absolute: 0 10*3/uL (ref 0.0–0.5)
HEMATOCRIT: 21.9 % — AB (ref 38.4–49.9)
HEMOGLOBIN: 7.4 g/dL — AB (ref 13.0–17.1)
LYMPH%: 19.9 % (ref 14.0–49.0)
MCH: 30.5 pg (ref 27.2–33.4)
MCHC: 33.8 g/dL (ref 32.0–36.0)
MCV: 90.1 fL (ref 79.3–98.0)
MONO#: 0.4 10*3/uL (ref 0.1–0.9)
MONO%: 10.9 % (ref 0.0–14.0)
NEUT#: 2.6 10*3/uL (ref 1.5–6.5)
NEUT%: 68.1 % (ref 39.0–75.0)
Platelets: 25 10*3/uL — ABNORMAL LOW (ref 140–400)
RBC: 2.43 10*6/uL — ABNORMAL LOW (ref 4.20–5.82)
RDW: 16.1 % — ABNORMAL HIGH (ref 11.0–14.6)
WBC: 3.8 10*3/uL — ABNORMAL LOW (ref 4.0–10.3)
lymph#: 0.8 10*3/uL — ABNORMAL LOW (ref 0.9–3.3)

## 2016-11-19 LAB — COMPREHENSIVE METABOLIC PANEL
ALBUMIN: 3.5 g/dL (ref 3.5–5.0)
ALT: 16 U/L (ref 0–55)
AST: 13 U/L (ref 5–34)
Alkaline Phosphatase: 60 U/L (ref 40–150)
Anion Gap: 9 mEq/L (ref 3–11)
BILIRUBIN TOTAL: 0.44 mg/dL (ref 0.20–1.20)
BUN: 12.4 mg/dL (ref 7.0–26.0)
CALCIUM: 9.4 mg/dL (ref 8.4–10.4)
CO2: 24 meq/L (ref 22–29)
Chloride: 106 mEq/L (ref 98–109)
Creatinine: 1.1 mg/dL (ref 0.7–1.3)
EGFR: 75 mL/min/{1.73_m2} — ABNORMAL LOW (ref >90)
Glucose: 196 mg/dl — ABNORMAL HIGH (ref 70–140)
POTASSIUM: 4.1 meq/L (ref 3.5–5.1)
SODIUM: 138 meq/L (ref 136–145)
Total Protein: 6.3 g/dL — ABNORMAL LOW (ref 6.4–8.3)

## 2016-11-20 ENCOUNTER — Telehealth: Payer: Self-pay | Admitting: Internal Medicine

## 2016-11-20 ENCOUNTER — Other Ambulatory Visit: Payer: Self-pay | Admitting: *Deleted

## 2016-11-20 ENCOUNTER — Telehealth: Payer: Self-pay | Admitting: *Deleted

## 2016-11-20 DIAGNOSIS — D649 Anemia, unspecified: Secondary | ICD-10-CM

## 2016-11-20 NOTE — Telephone Encounter (Signed)
Per MD, pt to rcv 2 units blood. LMOVM for pt to come in on Saturday at 0830 with labs today or Friday 4/19. Request pt call back to confirm message was received and schedule a lab appt. Message to scheduling.

## 2016-11-20 NOTE — Telephone Encounter (Signed)
Spoke with patient re appointments for 4/20 and 4/21

## 2016-11-21 ENCOUNTER — Other Ambulatory Visit: Payer: BLUE CROSS/BLUE SHIELD

## 2016-11-21 DIAGNOSIS — D649 Anemia, unspecified: Secondary | ICD-10-CM | POA: Diagnosis not present

## 2016-11-21 LAB — PREPARE RBC (CROSSMATCH)

## 2016-11-22 ENCOUNTER — Ambulatory Visit (HOSPITAL_BASED_OUTPATIENT_CLINIC_OR_DEPARTMENT_OTHER): Payer: BLUE CROSS/BLUE SHIELD

## 2016-11-22 DIAGNOSIS — D649 Anemia, unspecified: Secondary | ICD-10-CM | POA: Diagnosis not present

## 2016-11-22 LAB — PREPARE RBC (CROSSMATCH)

## 2016-11-22 MED ORDER — DIPHENHYDRAMINE HCL 25 MG PO CAPS
25.0000 mg | ORAL_CAPSULE | Freq: Once | ORAL | Status: AC
  Administered 2016-11-22: 25 mg via ORAL

## 2016-11-22 MED ORDER — ACETAMINOPHEN 325 MG PO TABS
ORAL_TABLET | ORAL | Status: AC
  Filled 2016-11-22: qty 2

## 2016-11-22 MED ORDER — SODIUM CHLORIDE 0.9% FLUSH
10.0000 mL | INTRAVENOUS | Status: AC | PRN
  Administered 2016-11-22: 10 mL
  Filled 2016-11-22: qty 10

## 2016-11-22 MED ORDER — ACETAMINOPHEN 325 MG PO TABS
650.0000 mg | ORAL_TABLET | Freq: Once | ORAL | Status: AC
  Administered 2016-11-22: 650 mg via ORAL

## 2016-11-22 MED ORDER — SODIUM CHLORIDE 0.9 % IV SOLN
250.0000 mL | Freq: Once | INTRAVENOUS | Status: AC
  Administered 2016-11-22: 250 mL via INTRAVENOUS

## 2016-11-22 MED ORDER — HEPARIN SOD (PORK) LOCK FLUSH 100 UNIT/ML IV SOLN
500.0000 [IU] | Freq: Every day | INTRAVENOUS | Status: AC | PRN
  Administered 2016-11-22: 500 [IU]
  Filled 2016-11-22: qty 5

## 2016-11-22 MED ORDER — DIPHENHYDRAMINE HCL 25 MG PO CAPS
ORAL_CAPSULE | ORAL | Status: AC
  Filled 2016-11-22: qty 1

## 2016-11-22 NOTE — Patient Instructions (Signed)
Blood Transfusion , Adult A blood transfusion is a procedure in which you receive donated blood, including plasma, platelets, and red blood cells, through an IV tube. You may need a blood transfusion because of illness, surgery, or injury. The blood may come from a donor. You may also be able to donate blood for yourself (autologous blood donation) before a surgery if you know that you might require a blood transfusion. The blood given in a transfusion is made up of different types of cells. You may receive:  Red blood cells. These carry oxygen to the cells in the body.  White blood cells. These help you fight infections.  Platelets. These help your blood to clot.  Plasma. This is the liquid part of your blood and it helps with fluid imbalances. If you have hemophilia or another clotting disorder, you may also receive other types of blood products. Tell a health care provider about:  Any allergies you have.  All medicines you are taking, including vitamins, herbs, eye drops, creams, and over-the-counter medicines.  Any problems you or family members have had with anesthetic medicines.  Any blood disorders you have.  Any surgeries you have had.  Any medical conditions you have, including any recent fever or cold symptoms.  Whether you are pregnant or may be pregnant.  Any previous reactions you have had during a blood transfusion. What are the risks? Generally, this is a safe procedure. However, problems may occur, including:  Having an allergic reaction to something in the donated blood. Hives and itching may be symptoms of this type of reaction.  Fever. This may be a reaction to the white blood cells in the transfused blood. Nausea or chest pain may accompany a fever.  Iron overload. This can happen from having many transfusions.  Transfusion-related acute lung injury (TRALI). This is a rare reaction that causes lung damage. The cause is not known.TRALI can occur within hours  of a transfusion or several days later.  Sudden (acute) or delayed hemolytic reactions. This happens if your blood does not match the cells in your transfusion. Your body's defense system (immune system) may try to attack the new cells. This complication is rare. The symptoms include fever, chills, nausea, and low back pain or chest pain.  Infection or disease transmission. This is rare. What happens before the procedure?  You will have a blood test to determine your blood type. This is necessary to know what kind of blood your body will accept and to match it to the donor blood.  If you are going to have a planned surgery, you may be able to do an autologous blood donation. This may be done in case you need to have a transfusion.  If you have had an allergic reaction to a transfusion in the past, you may be given medicine to help prevent a reaction. This medicine may be given to you by mouth or through an IV tube.  You will have your temperature, blood pressure, and pulse monitored before the transfusion.  Follow instructions from your health care provider about eating and drinking restrictions.  Ask your health care provider about:  Changing or stopping your regular medicines. This is especially important if you are taking diabetes medicines or blood thinners.  Taking medicines such as aspirin and ibuprofen. These medicines can thin your blood. Do not take these medicines before your procedure if your health care provider instructs you not to. What happens during the procedure?  An IV tube will be   inserted into one of your veins.  The bag of donated blood will be attached to your IV tube. The blood will then enter through your vein.  Your temperature, blood pressure, and pulse will be monitored regularly during the transfusion. This monitoring is done to detect early signs of a transfusion reaction.  If you have any signs or symptoms of a reaction, your transfusion will be stopped and  you may be given medicine.  When the transfusion is complete, your IV tube will be removed.  Pressure may be applied to the IV site for a few minutes.  A bandage (dressing) will be applied. The procedure may vary among health care providers and hospitals. What happens after the procedure?  Your temperature, blood pressure, heart rate, breathing rate, and blood oxygen level will be monitored often.  Your blood may be tested to see how you are responding to the transfusion.  You may be warmed with fluids or blankets to maintain a normal body temperature. Summary  A blood transfusion is a procedure in which you receive donated blood, including plasma, platelets, and red blood cells, through an IV tube.  Your temperature, blood pressure, and pulse will be monitored before, during, and after the transfusion.  Your blood may be tested after the transfusion to see how your body has responded. This information is not intended to replace advice given to you by your health care provider. Make sure you discuss any questions you have with your health care provider. Document Released: 07/18/2000 Document Revised: 04/17/2016 Document Reviewed: 04/17/2016 Elsevier Interactive Patient Education  2017 [employer]  

## 2016-11-23 LAB — TYPE AND SCREEN
ABO/RH(D): O POS
Antibody Screen: NEGATIVE
UNIT DIVISION: 0
Unit division: 0

## 2016-11-23 LAB — BPAM RBC
BLOOD PRODUCT EXPIRATION DATE: 201805172359
Blood Product Expiration Date: 201805162359
ISSUE DATE / TIME: 201804210858
ISSUE DATE / TIME: 201804210858
UNIT TYPE AND RH: 5100
Unit Type and Rh: 5100

## 2016-11-26 ENCOUNTER — Ambulatory Visit (HOSPITAL_BASED_OUTPATIENT_CLINIC_OR_DEPARTMENT_OTHER): Payer: BLUE CROSS/BLUE SHIELD | Admitting: Internal Medicine

## 2016-11-26 ENCOUNTER — Encounter: Payer: Self-pay | Admitting: Internal Medicine

## 2016-11-26 ENCOUNTER — Ambulatory Visit (HOSPITAL_BASED_OUTPATIENT_CLINIC_OR_DEPARTMENT_OTHER): Payer: BLUE CROSS/BLUE SHIELD

## 2016-11-26 ENCOUNTER — Other Ambulatory Visit (HOSPITAL_BASED_OUTPATIENT_CLINIC_OR_DEPARTMENT_OTHER): Payer: BLUE CROSS/BLUE SHIELD

## 2016-11-26 ENCOUNTER — Ambulatory Visit: Payer: BLUE CROSS/BLUE SHIELD

## 2016-11-26 VITALS — BP 131/69 | HR 73 | Temp 98.3°F | Resp 16 | Wt 221.1 lb

## 2016-11-26 VITALS — BP 131/69 | HR 73 | Temp 98.3°F | Resp 16 | Ht 74.0 in | Wt 221.2 lb

## 2016-11-26 DIAGNOSIS — C3412 Malignant neoplasm of upper lobe, left bronchus or lung: Secondary | ICD-10-CM

## 2016-11-26 DIAGNOSIS — Z5111 Encounter for antineoplastic chemotherapy: Secondary | ICD-10-CM

## 2016-11-26 DIAGNOSIS — C3492 Malignant neoplasm of unspecified part of left bronchus or lung: Secondary | ICD-10-CM

## 2016-11-26 DIAGNOSIS — I10 Essential (primary) hypertension: Secondary | ICD-10-CM

## 2016-11-26 DIAGNOSIS — D6481 Anemia due to antineoplastic chemotherapy: Secondary | ICD-10-CM

## 2016-11-26 DIAGNOSIS — Z7189 Other specified counseling: Secondary | ICD-10-CM

## 2016-11-26 DIAGNOSIS — I739 Peripheral vascular disease, unspecified: Secondary | ICD-10-CM

## 2016-11-26 LAB — COMPREHENSIVE METABOLIC PANEL
ALBUMIN: 3.6 g/dL (ref 3.5–5.0)
ALK PHOS: 70 U/L (ref 40–150)
ALT: 17 U/L (ref 0–55)
AST: 15 U/L (ref 5–34)
Anion Gap: 9 mEq/L (ref 3–11)
BUN: 12.6 mg/dL (ref 7.0–26.0)
CALCIUM: 9.4 mg/dL (ref 8.4–10.4)
CO2: 23 mEq/L (ref 22–29)
CREATININE: 1 mg/dL (ref 0.7–1.3)
Chloride: 107 mEq/L (ref 98–109)
EGFR: 78 mL/min/{1.73_m2} — ABNORMAL LOW (ref >90)
Glucose: 230 mg/dl — ABNORMAL HIGH (ref 70–140)
POTASSIUM: 3.9 meq/L (ref 3.5–5.1)
Sodium: 139 mEq/L (ref 136–145)
Total Bilirubin: 0.41 mg/dL (ref 0.20–1.20)
Total Protein: 6.5 g/dL (ref 6.4–8.3)

## 2016-11-26 LAB — CBC WITH DIFFERENTIAL/PLATELET
BASO%: 0.4 % (ref 0.0–2.0)
Basophils Absolute: 0 10*3/uL (ref 0.0–0.1)
EOS%: 0.7 % (ref 0.0–7.0)
Eosinophils Absolute: 0 10*3/uL (ref 0.0–0.5)
HEMATOCRIT: 26.1 % — AB (ref 38.4–49.9)
HEMOGLOBIN: 8.9 g/dL — AB (ref 13.0–17.1)
LYMPH#: 0.8 10*3/uL — AB (ref 0.9–3.3)
LYMPH%: 20.3 % (ref 14.0–49.0)
MCH: 31.5 pg (ref 27.2–33.4)
MCHC: 34.2 g/dL (ref 32.0–36.0)
MCV: 92.2 fL (ref 79.3–98.0)
MONO#: 0.4 10*3/uL (ref 0.1–0.9)
MONO%: 9.2 % (ref 0.0–14.0)
NEUT#: 2.9 10*3/uL (ref 1.5–6.5)
NEUT%: 69.4 % (ref 39.0–75.0)
Platelets: 124 10*3/uL — ABNORMAL LOW (ref 140–400)
RBC: 2.83 10*6/uL — ABNORMAL LOW (ref 4.20–5.82)
RDW: 16 % — AB (ref 11.0–14.6)
WBC: 4.2 10*3/uL (ref 4.0–10.3)

## 2016-11-26 MED ORDER — PALONOSETRON HCL INJECTION 0.25 MG/5ML
INTRAVENOUS | Status: AC
  Filled 2016-11-26: qty 5

## 2016-11-26 MED ORDER — SODIUM CHLORIDE 0.9% FLUSH
10.0000 mL | INTRAVENOUS | Status: DC | PRN
  Administered 2016-11-26: 10 mL
  Filled 2016-11-26: qty 10

## 2016-11-26 MED ORDER — CARBOPLATIN CHEMO INJECTION 600 MG/60ML
533.6000 mg | Freq: Once | INTRAVENOUS | Status: AC
  Administered 2016-11-26: 530 mg via INTRAVENOUS
  Filled 2016-11-26: qty 53

## 2016-11-26 MED ORDER — DEXAMETHASONE SODIUM PHOSPHATE 10 MG/ML IJ SOLN
10.0000 mg | Freq: Once | INTRAMUSCULAR | Status: AC
  Administered 2016-11-26: 10 mg via INTRAVENOUS

## 2016-11-26 MED ORDER — DEXAMETHASONE SODIUM PHOSPHATE 10 MG/ML IJ SOLN
INTRAMUSCULAR | Status: AC
  Filled 2016-11-26: qty 1

## 2016-11-26 MED ORDER — SODIUM CHLORIDE 0.9% FLUSH
10.0000 mL | Freq: Once | INTRAVENOUS | Status: AC
  Administered 2016-11-26: 10 mL via INTRAVENOUS
  Filled 2016-11-26: qty 10

## 2016-11-26 MED ORDER — PALONOSETRON HCL INJECTION 0.25 MG/5ML
0.2500 mg | Freq: Once | INTRAVENOUS | Status: AC
  Administered 2016-11-26: 0.25 mg via INTRAVENOUS

## 2016-11-26 MED ORDER — ETOPOSIDE CHEMO INJECTION 1 GM/50ML
100.0000 mg/m2 | Freq: Once | INTRAVENOUS | Status: AC
  Administered 2016-11-26: 230 mg via INTRAVENOUS
  Filled 2016-11-26: qty 11.5

## 2016-11-26 MED ORDER — SODIUM CHLORIDE 0.9 % IV SOLN
Freq: Once | INTRAVENOUS | Status: AC
  Administered 2016-11-26: 13:00:00 via INTRAVENOUS

## 2016-11-26 MED ORDER — HEPARIN SOD (PORK) LOCK FLUSH 100 UNIT/ML IV SOLN
500.0000 [IU] | Freq: Once | INTRAVENOUS | Status: AC | PRN
  Administered 2016-11-26: 500 [IU]
  Filled 2016-11-26: qty 5

## 2016-11-26 MED ORDER — LIDOCAINE-PRILOCAINE 2.5-2.5 % EX CREA: 1.0000 "application " | TOPICAL_CREAM | CUTANEOUS | 0 refills | Status: DC | PRN

## 2016-11-26 NOTE — Progress Notes (Signed)
[facility] Telephone:([phone]   [phone]  OFFICE PROGRESS NOTE  Simona Huh, MD [address] [place] [address]  DIAGNOSIS: Extensive stage (T4, N2, M1b) small cell lung cancer presented with large left upper lobe lung mass with mediastinal invasion as well as pancreatic lesion and suspicious left iliac bone metastasis diagnosed in February 2018.  PRIOR THERAPY: None  CURRENT THERAPY: Systemic chemotherapy with carbo platinum for AUC of 5 on day 1 and etoposide 100 MG/M2 on days 1, 2 and 3 with Neulasta support status post 3 cycles. This is concurrent with radiotherapy. This also concurrent with radiation to the large lung mass.  INTERVAL HISTORY: Jesse Avila 66 y.o. male returns to the clinic today for follow-up visit accompanied by his wife. The patient is feeling little bit better today. He denied having any significant chest pain, shortness of breath, cough or hemoptysis. He continues to have mild fatigue. He received 2 units of PRBCs transfusion last week. He denied having any fever or chills. He has no nausea, vomiting, diarrhea or constipation. He is here today for evaluation before starting cycle #4 of his chemotherapy.  MEDICAL HISTORY: Past Medical History:  Diagnosis Date  . Carotid artery occlusion   . DJD (degenerative joint disease) of knee   . Essential hypertension, benign    takes Benicar daily  . Goals of care, counseling/discussion 09/18/2016  . History of colon polyps    benign  . History of gout   . Hyperlipidemia    takes Fenofibrate and Crestor daily  . Insomnia    takes Ambien nightly as needed  . Joint pain   . Lung cancer ([place])   . Nocturia   . Peripheral vascular disease ([place])   . Primary localized osteoarthritis of left knee 12/26/2014  . Stroke [facility]) 2015?   takes Plavix daily as well as Pletal,not on plavix at present 05/15/15  . 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  MEDICATIONS:  Current Outpatient Prescriptions  Medication Sig Dispense Refill  . apixaban (ELIQUIS) 5 MG TABS tablet Take 1 tablet (5 mg total) by mouth 2 (two) times daily. 60 tablet 3  . Blood Glucose Monitoring Suppl (ACCU-CHEK NANO SMARTVIEW) w/Device KIT by Does not apply route.    . diltiazem (CARDIZEM CD) 120 MG 24 hr capsule Take 1 capsule (120 mg total) by mouth daily. 30 capsule 2  . emollient (BIAFINE) cream Apply topically 2 (two) times daily.    . fenofibrate 160 MG tablet Take 160 mg by mouth daily.    Marland Kitchen glimepiride (AMARYL) 4 MG tablet Take 4 mg by mouth daily with breakfast.    . lidocaine-prilocaine (EMLA) cream Apply 1 application topically as needed. 30 g 0  . metFORMIN (GLUCOPHAGE) 500 MG tablet Take 1 tablet (500 mg total) by mouth 3 (three) times daily.    . metoprolol succinate (TOPROL-XL) 50 MG 24 hr tablet Take 50 mg by mouth daily. Take with or immediately following a meal.    . pioglitazone (ACTOS) 15 MG tablet Take 15 mg by mouth daily.    . rosuvastatin (CRESTOR) 40 MG tablet Take 40 mg by mouth every evening. For cholesterol    . zolpidem (AMBIEN) 10 MG tablet Take 10 mg by mouth at bedtime as needed for sleep.     Marland Kitchen  prochlorperazine (COMPAZINE) 10 MG tablet Take 1 tablet (10 mg total) by mouth every 6 (six) hours as needed for nausea or vomiting. (Patient not taking: Reported on 11/26/2016) 30 tablet 0   No current facility-administered medications for this visit.    Facility-Administered Medications Ordered in Other Visits  Medication Dose Route Frequency Provider Last Rate Last Dose  . heparin lock flush 100 unit/mL  500 Units Intracatheter Once PRN Curt Bears, MD      . sodium chloride flush (NS) 0.9 % injection 10 mL  10 mL Intracatheter PRN Curt Bears, MD   10 mL at 10/13/16 1618  . sodium chloride  flush (NS) 0.9 % injection 10 mL  10 mL Intracatheter PRN Curt Bears, MD        SURGICAL HISTORY:  Past Surgical History:  Procedure Laterality Date  . ABDOMINAL AORTAGRAM N/A 03/29/2012   Procedure: ABDOMINAL Maxcine Ham;  Surgeon: Angelia Mould, MD;  Location: [facility] CATH LAB;  Service: Cardiovascular;  Laterality: N/A;  . COLONOSCOPY    . ENDARTERECTOMY Right 05/09/2014   Procedure: RIGHT CAROTID ENDARTERECTOMY WITH PATCH ANGIOPLASTY;  Surgeon: Conrad Frostburg, MD;  Location: [facility];  Service: Vascular;  Laterality: Right;  . ENDARTERECTOMY FEMORAL Right 05/17/2015   Procedure: ENDARTERECTOMY FEMORAL;  Surgeon: Angelia Mould, MD;  Location: [facility];  Service: Vascular;  Laterality: Right;  . ENDOBRONCHIAL ULTRASOUND Bilateral 09/08/2016   Procedure: ENDOBRONCHIAL ULTRASOUND;  Surgeon: Rigoberto Noel, MD;  Location: [facility] ENDOSCOPY;  Service: Cardiopulmonary;  Laterality: Bilateral;  . FEMORAL-POPLITEAL BYPASS GRAFT Right 05/17/2015   Procedure: BYPASS GRAFT FEMORAL-BELOW KNEE POPLITEAL ARTERY, using gortex propaten graft 6 mm x 80 cm;  Surgeon: Angelia Mould, MD;  Location: [facility];  Service: Vascular;  Laterality: Right;  . IR GENERIC HISTORICAL  09/25/2016   IR FLUORO GUIDE PORT INSERTION RIGHT 09/25/2016 Markus Daft, MD [facility]-INTER[facility] RAD  . IR GENERIC HISTORICAL  09/25/2016   IR US GUIDE VASC ACCESS RIGHT 09/25/2016 Markus Daft, MD [facility]  . KNEE ARTHROSCOPY Right 11/2009  . LOWER EXTREMITY ANGIOGRAM Bilateral 03/23/2015   Procedure: Lower Extremity Angiogram;  Surgeon: Angelia Mould, MD;  Location: [facility] CV LAB;  Service: Cardiovascular;  Laterality: Bilateral;  . MENISCUS REPAIR  11/2009  . PARTIAL KNEE ARTHROPLASTY Left 12/26/2014   Procedure: UNICOMPARTMENTAL KNEE;  Surgeon: Marchia Bond, MD;  Location: [facility];  Service: Orthopedics;  Laterality: Left;  . PATCH ANGIOPLASTY Right 05/17/2015   Procedure: VEIN PATCH ANGIOPLASTY ILEOFEMORAL ARTERY;  Surgeon: Angelia Mould, MD;  Location: [facility];  Service: Vascular;  Laterality: Right;  . PERCUTANEOUS STENT INTERVENTION N/A 05/10/2012   Procedure: PERCUTANEOUS STENT INTERVENTION;  Surgeon: Angelia Mould, MD;  Location: [facility] CATH LAB;  Service: Cardiovascular;  Laterality: N/A;  . PERIPHERAL VASCULAR CATHETERIZATION N/A 03/23/2015   Procedure: Abdominal Aortogram;  Surgeon: Angelia Mould, MD;  Location: [facility] CV LAB;  Service: Cardiovascular;  Laterality: N/A;  . STENTS     PLACED IN ??BOTH LEGS   2013?    REVIEW OF SYSTEMS:  A comprehensive review of systems was negative except for: Constitutional: positive for fatigue   PHYSICAL EXAMINATION: General appearance: alert, cooperative, fatigued and no distress Head: Normocephalic, without obvious abnormality, atraumatic Neck: no adenopathy, no JVD, supple, symmetrical, trachea midline and thyroid not enlarged, symmetric, no tenderness/mass/nodules Lymph nodes: Cervical, supraclavicular, and axillary nodes normal. Resp: clear to auscultation bilaterally Back: symmetric, no curvature. ROM normal. No CVA tenderness. Cardio: regular rate and rhythm, S1,  S2 normal, no murmur, click, rub or gallop GI: soft, non-tender; bowel sounds normal; no masses,  no organomegaly Extremities: extremities normal, atraumatic, no cyanosis or edema  ECOG PERFORMANCE STATUS: 1 - Symptomatic but completely ambulatory  Blood pressure 131/69, pulse 73, temperature 98.3 F (36.8 C), temperature source Oral, resp. rate 16, height _0  (1.88 m), weight 221 lb 3.2 oz (100.3 kg), SpO2 98 %.  LABORATORY DATA: Lab Results  Component Value Date   WBC 4.2 11/26/2016   HGB 8.9 (L) 11/26/2016   HCT 26.1 (L) 11/26/2016   MCV 92.2 11/26/2016   PLT 124 (L) 11/26/2016      Chemistry      Component Value Date/Time   NA 139 11/26/2016 1030   K 3.9 11/26/2016 1030   CL 101 10/22/2016 0914   CO2 23 11/26/2016 1030   BUN 12.6 11/26/2016 1030   CREATININE 1.0  11/26/2016 1030      Component Value Date/Time   CALCIUM 9.4 11/26/2016 1030   ALKPHOS 70 11/26/2016 1030   AST 15 11/26/2016 1030   ALT 17 11/26/2016 1030   BILITOT 0.41 11/26/2016 1030       RADIOGRAPHIC STUDIES: Ct Chest W Contrast  Result Date: 11/04/2016 CLINICAL DATA:  Small cell lung cancer diagnosed in late January 2018, restaging. EXAM: CT CHEST, ABDOMEN, AND PELVIS WITH CONTRAST TECHNIQUE: Multidetector CT imaging of the chest, abdomen and pelvis was performed following the standard protocol during bolus administration of intravenous contrast. CONTRAST:  164m ISOVUE-300 IOPAMIDOL (ISOVUE-300) INJECTION 61% COMPARISON:  Multiple exams, including PET-CT from 09/17/2016 FINDINGS: CT CHEST FINDINGS Cardiovascular: Coronary, aortic arch, and branch vessel atherosclerotic vascular disease. Narrow left upper lobe pulmonary arterial vessels probably due to a combination of direct mass effect from tumor and hypoaeration leading to reduced blood flow. Mediastinum/Nodes: The left upper lobe suprahilar mass extending into the AP window measures 5.7 by 4.6 cm, previously 8.7 by 7.2 cm. Tumor extends along the upper margin of the left upper lobe pulmonary artery and around the left tracheobronchial tree. Mild diffuse wall thickening in the esophagus. Lungs/Pleura: As noted above the left suprahilar mass is reduced in size. There is considerable narrowing of the left upper lobe tracheobronchial tree is specially the non lingular branches, with associated airway thickening, likely due to extrinsic effect of the tumor. This in turn probably causes hypoaeration and reduced pulmonary arterial supply through vaso constriction. In addition to the suprahilar mass with bandlike densities radiating from and distally, there is interstitial accentuation in this portion of the left upper lobe. Mild paraseptal emphysema.  Bilateral calcified pleural plaques. Musculoskeletal: Lower thoracic spondylosis. CT ABDOMEN  PELVIS FINDINGS Hepatobiliary: Diffuse hepatic steatosis. Hepatic morphology raises the possibility of cirrhosis. No well-defined hepatic mass. Contracted gallbladder. Pancreas: I do not see differential enhancement in the uncinate process of the pancreas, this area was hypermetabolic on the prior exam concerning for potential focal malignancy. Spleen: Unremarkable Adrenals/Urinary Tract: Unremarkable Stomach/Bowel: Sigmoid diverticulosis. Vascular/Lymphatic: Aortoiliac atherosclerotic vascular disease. Small porta hepatis and retroperitoneal lymph nodes do not appear pathologically enlarged by size criteria. There is evidence of a thrombosed right femoral to distal graft. Significant narrowing of the proximal SFA bilaterally. Reproductive: Unremarkable Other: No supplemental non-categorized findings. Musculoskeletal: At the site of ill-defined metabolic activity in the left iliac bone lateral to the sacroiliac joint on the prior exam, there is currently some very faint asymmetric sclerosis as shown on image 101/2. This appears more conspicuous than on the prior PET-CT. IMPRESSION: 1. Marked reduction in  size of the left upper lobe mass, currently 5.7 by 4.6 cm, previously 8.7 by 7.2 cm. The residual tumor extends around the left upper lobe tracheobronchial tree and narrows left upper lobe pulmonary arterial structures as well as the tracheobronchial tree. 2. At the site of the prior focal hypermetabolic activity in the uncinate process of the pancreas, I do not see a distinct tumor. Occult tumor is certainly a possibility given the prior PET-CT findings. 3. At the site of prior ill-defined metabolic activity in the left iliac bone there is faint but asymmetric and increased sclerosis, probably reflecting treated osseous metastatic lesion. 4. Considerable atherosclerosis, with high-grade narrowing of the proximal superficial femoral arteries bilaterally. 5. Mild diffuse wall thickening of the esophagus suggesting  esophagitis. 6. Other imaging findings of potential clinical significance: Paraseptal emphysema. Bilateral calcified pleural plaques. Diffuse hepatic steatosis with hepatic morphology raising the possibility of cirrhosis. Sigmoid diverticulosis. Electronically Signed   By: Van Clines M.D.   On: 11/04/2016 14:48   Ct Abdomen Pelvis W Contrast  Result Date: 11/04/2016 CLINICAL DATA:  Small cell lung cancer diagnosed in late January 2018, restaging. EXAM: CT CHEST, ABDOMEN, AND PELVIS WITH CONTRAST TECHNIQUE: Multidetector CT imaging of the chest, abdomen and pelvis was performed following the standard protocol during bolus administration of intravenous contrast. CONTRAST:  128m ISOVUE-300 IOPAMIDOL (ISOVUE-300) INJECTION 61% COMPARISON:  Multiple exams, including PET-CT from 09/17/2016 FINDINGS: CT CHEST FINDINGS Cardiovascular: Coronary, aortic arch, and branch vessel atherosclerotic vascular disease. Narrow left upper lobe pulmonary arterial vessels probably due to a combination of direct mass effect from tumor and hypoaeration leading to reduced blood flow. Mediastinum/Nodes: The left upper lobe suprahilar mass extending into the AP window measures 5.7 by 4.6 cm, previously 8.7 by 7.2 cm. Tumor extends along the upper margin of the left upper lobe pulmonary artery and around the left tracheobronchial tree. Mild diffuse wall thickening in the esophagus. Lungs/Pleura: As noted above the left suprahilar mass is reduced in size. There is considerable narrowing of the left upper lobe tracheobronchial tree is specially the non lingular branches, with associated airway thickening, likely due to extrinsic effect of the tumor. This in turn probably causes hypoaeration and reduced pulmonary arterial supply through vaso constriction. In addition to the suprahilar mass with bandlike densities radiating from and distally, there is interstitial accentuation in this portion of the left upper lobe. Mild paraseptal  emphysema.  Bilateral calcified pleural plaques. Musculoskeletal: Lower thoracic spondylosis. CT ABDOMEN PELVIS FINDINGS Hepatobiliary: Diffuse hepatic steatosis. Hepatic morphology raises the possibility of cirrhosis. No well-defined hepatic mass. Contracted gallbladder. Pancreas: I do not see differential enhancement in the uncinate process of the pancreas, this area was hypermetabolic on the prior exam concerning for potential focal malignancy. Spleen: Unremarkable Adrenals/Urinary Tract: Unremarkable Stomach/Bowel: Sigmoid diverticulosis. Vascular/Lymphatic: Aortoiliac atherosclerotic vascular disease. Small porta hepatis and retroperitoneal lymph nodes do not appear pathologically enlarged by size criteria. There is evidence of a thrombosed right femoral to distal graft. Significant narrowing of the proximal SFA bilaterally. Reproductive: Unremarkable Other: No supplemental non-categorized findings. Musculoskeletal: At the site of ill-defined metabolic activity in the left iliac bone lateral to the sacroiliac joint on the prior exam, there is currently some very faint asymmetric sclerosis as shown on image 101/2. This appears more conspicuous than on the prior PET-CT. IMPRESSION: 1. Marked reduction in size of the left upper lobe mass, currently 5.7 by 4.6 cm, previously 8.7 by 7.2 cm. The residual tumor extends around the left upper lobe tracheobronchial tree and narrows  left upper lobe pulmonary arterial structures as well as the tracheobronchial tree. 2. At the site of the prior focal hypermetabolic activity in the uncinate process of the pancreas, I do not see a distinct tumor. Occult tumor is certainly a possibility given the prior PET-CT findings. 3. At the site of prior ill-defined metabolic activity in the left iliac bone there is faint but asymmetric and increased sclerosis, probably reflecting treated osseous metastatic lesion. 4. Considerable atherosclerosis, with high-grade narrowing of the proximal  superficial femoral arteries bilaterally. 5. Mild diffuse wall thickening of the esophagus suggesting esophagitis. 6. Other imaging findings of potential clinical significance: Paraseptal emphysema. Bilateral calcified pleural plaques. Diffuse hepatic steatosis with hepatic morphology raising the possibility of cirrhosis. Sigmoid diverticulosis. Electronically Signed   By: Van Clines M.D.   On: 11/04/2016 14:48    ASSESSMENT AND PLAN:  This is a very pleasant 66 years old white male with extensive stage small cell lung cancer. He is currently undergoing systemic chemotherapy with carboplatin and etoposide in addition to concurrent radiotherapy therapy to the lung mass. The patient has been tolerating his treatment well except for significant fatigue from chemotherapy-induced anemia. I recommended for him to proceed with cycle #4 today as scheduled. For the chemotherapy-induced anemia, I will continue to monitor his blood count closely and consider the patient for transfusion if needed. I will see him back for follow-up visit in 3 weeks for evaluation after repeating CT scan of the chest, abdomen and pelvis for restaging of his disease. The patient was advised to call immediately if he has any concerning symptoms in the interval. The patient voices understanding of current disease status and treatment options and is in agreement with the current care plan. All questions were answered. The patient knows to call the clinic with any problems, questions or concerns. We can certainly see the patient much sooner if necessary. I spent 15 minutes counseling the patient face to face. The total time spent in the appointment was 25 minutes. Disclaimer: This note was dictated with voice recognition software. Similar sounding words can inadvertently be transcribed and may not be corrected upon review.

## 2016-11-26 NOTE — Patient Instructions (Signed)
Implanted Port Home Guide An implanted port is a type of central line that is placed under the skin. Central lines are used to provide IV access when treatment or nutrition needs to be given through a person's veins. Implanted ports are used for long-term IV access. An implanted port may be placed because:  You need IV medicine that would be irritating to the small veins in your hands or arms.  You need long-term IV medicines, such as antibiotics.  You need IV nutrition for a long period.  You need frequent blood draws for lab tests.  You need dialysis.  Implanted ports are usually placed in the chest area, but they can also be placed in the upper arm, the abdomen, or the leg. An implanted port has two main parts:  Reservoir. The reservoir is round and will appear as a small, raised area under your skin. The reservoir is the part where a needle is inserted to give medicines or draw blood.  Catheter. The catheter is a thin, flexible tube that extends from the reservoir. The catheter is placed into a large vein. Medicine that is inserted into the reservoir goes into the catheter and then into the vein.  How will I care for my incision site? Do not get the incision site wet. Bathe or shower as directed by your health care provider. How is my port accessed? Special steps must be taken to access the port:  Before the port is accessed, a numbing cream can be placed on the skin. This helps numb the skin over the port site.  Your health care provider uses a sterile technique to access the port. ? Your health care provider must put on a mask and sterile gloves. ? The skin over your port is cleaned carefully with an antiseptic and allowed to dry. ? The port is gently pinched between sterile gloves, and a needle is inserted into the port.  Only "non-coring" port needles should be used to access the port. Once the port is accessed, a blood return should be checked. This helps ensure that the port  is in the vein and is not clogged.  If your port needs to remain accessed for a constant infusion, a clear (transparent) bandage will be placed over the needle site. The bandage and needle will need to be changed every week, or as directed by your health care provider.  Keep the bandage covering the needle clean and dry. Do not get it wet. Follow your health care provider's instructions on how to take a shower or bath while the port is accessed.  If your port does not need to stay accessed, no bandage is needed over the port.  What is flushing? Flushing helps keep the port from getting clogged. Follow your health care provider's instructions on how and when to flush the port. Ports are usually flushed with saline solution or a medicine called heparin. The need for flushing will depend on how the port is used.  If the port is used for intermittent medicines or blood draws, the port will need to be flushed: ? After medicines have been given. ? After blood has been drawn. ? As part of routine maintenance.  If a constant infusion is running, the port may not need to be flushed.  How long will my port stay implanted? The port can stay in for as long as your health care provider thinks it is needed. When it is time for the port to come out, surgery will be   done to remove it. The procedure is similar to the one performed when the port was put in. When should I seek immediate medical care? When you have an implanted port, you should seek immediate medical care if:  You notice a bad smell coming from the incision site.  You have swelling, redness, or drainage at the incision site.  You have more swelling or pain at the port site or the surrounding area.  You have a fever that is not controlled with medicine.  This information is not intended to replace advice given to you by your health care provider. Make sure you discuss any questions you have with your health care provider. Document  Released: 07/21/2005 Document Revised: 12/27/2015 Document Reviewed: 03/28/2013 Elsevier Interactive Patient Education  2017 Elsevier Inc.  

## 2016-11-26 NOTE — Patient Instructions (Signed)
[facility] Discharge Instructions for Patients Receiving Chemotherapy  Today you received the following chemotherapy agents Carboplatin/Etoposide  To help prevent nausea and vomiting after your treatment, we encourage you to take your nausea medication as needed   If you develop nausea and vomiting that is not controlled by your nausea medication, call the clinic.   BELOW ARE SYMPTOMS THAT SHOULD BE REPORTED IMMEDIATELY:  *FEVER GREATER THAN 100.5 F  *CHILLS WITH OR WITHOUT FEVER  NAUSEA AND VOMITING THAT IS NOT CONTROLLED WITH YOUR NAUSEA MEDICATION  *UNUSUAL SHORTNESS OF BREATH  *UNUSUAL BRUISING OR BLEEDING  TENDERNESS IN MOUTH AND THROAT WITH OR WITHOUT PRESENCE OF ULCERS  *URINARY PROBLEMS  *BOWEL PROBLEMS  UNUSUAL RASH Items with * indicate a potential emergency and should be followed up as soon as possible.  Feel free to call the clinic you have any questions or concerns. The clinic phone number is [phone].  Please show the [facility] at check-in to the Emergency Department and triage nurse.

## 2016-11-27 ENCOUNTER — Ambulatory Visit (HOSPITAL_BASED_OUTPATIENT_CLINIC_OR_DEPARTMENT_OTHER): Payer: BLUE CROSS/BLUE SHIELD

## 2016-11-27 ENCOUNTER — Telehealth: Payer: Self-pay | Admitting: Internal Medicine

## 2016-11-27 ENCOUNTER — Other Ambulatory Visit: Payer: BLUE CROSS/BLUE SHIELD

## 2016-11-27 VITALS — BP 118/65 | HR 81 | Temp 98.0°F | Resp 17

## 2016-11-27 DIAGNOSIS — Z5111 Encounter for antineoplastic chemotherapy: Secondary | ICD-10-CM

## 2016-11-27 DIAGNOSIS — C3412 Malignant neoplasm of upper lobe, left bronchus or lung: Secondary | ICD-10-CM | POA: Diagnosis not present

## 2016-11-27 DIAGNOSIS — C3492 Malignant neoplasm of unspecified part of left bronchus or lung: Secondary | ICD-10-CM

## 2016-11-27 MED ORDER — HEPARIN SOD (PORK) LOCK FLUSH 100 UNIT/ML IV SOLN
500.0000 [IU] | Freq: Once | INTRAVENOUS | Status: AC | PRN
  Administered 2016-11-27: 500 [IU]
  Filled 2016-11-27: qty 5

## 2016-11-27 MED ORDER — DEXAMETHASONE SODIUM PHOSPHATE 10 MG/ML IJ SOLN
10.0000 mg | Freq: Once | INTRAMUSCULAR | Status: AC
  Administered 2016-11-27: 10 mg via INTRAVENOUS

## 2016-11-27 MED ORDER — ETOPOSIDE CHEMO INJECTION 1 GM/50ML
100.0000 mg/m2 | Freq: Once | INTRAVENOUS | Status: AC
  Administered 2016-11-27: 230 mg via INTRAVENOUS
  Filled 2016-11-27: qty 11.5

## 2016-11-27 MED ORDER — SODIUM CHLORIDE 0.9% FLUSH
10.0000 mL | INTRAVENOUS | Status: DC | PRN
  Administered 2016-11-27: 10 mL
  Filled 2016-11-27: qty 10

## 2016-11-27 MED ORDER — SODIUM CHLORIDE 0.9 % IV SOLN
Freq: Once | INTRAVENOUS | Status: AC
  Administered 2016-11-27: 13:00:00 via INTRAVENOUS

## 2016-11-27 MED ORDER — DEXAMETHASONE SODIUM PHOSPHATE 10 MG/ML IJ SOLN
INTRAMUSCULAR | Status: AC
  Filled 2016-11-27: qty 1

## 2016-11-27 NOTE — Patient Instructions (Signed)
[facility] Discharge Instructions for Patients Receiving Chemotherapy  Today you received the following chemotherapy agents: Etoposide   To help prevent nausea and vomiting after your treatment, we encourage you to take your nausea medication as directed.    If you develop nausea and vomiting that is not controlled by your nausea medication, call the clinic.   BELOW ARE SYMPTOMS THAT SHOULD BE REPORTED IMMEDIATELY:  *FEVER GREATER THAN 100.5 F  *CHILLS WITH OR WITHOUT FEVER  NAUSEA AND VOMITING THAT IS NOT CONTROLLED WITH YOUR NAUSEA MEDICATION  *UNUSUAL SHORTNESS OF BREATH  *UNUSUAL BRUISING OR BLEEDING  TENDERNESS IN MOUTH AND THROAT WITH OR WITHOUT PRESENCE OF ULCERS  *URINARY PROBLEMS  *BOWEL PROBLEMS  UNUSUAL RASH Items with * indicate a potential emergency and should be followed up as soon as possible.  Feel free to call the clinic you have any questions or concerns. The clinic phone number is [phone].  Please show the CHEMO ALERT CARD at check-in to the Emergency Department and triage nurse.   

## 2016-11-27 NOTE — Telephone Encounter (Signed)
Inj appt scheduled per schedule message from [facility] - Patient to pick up new schedule tomorrow

## 2016-11-28 ENCOUNTER — Telehealth: Payer: Self-pay | Admitting: Internal Medicine

## 2016-11-28 ENCOUNTER — Other Ambulatory Visit: Payer: BLUE CROSS/BLUE SHIELD

## 2016-11-28 ENCOUNTER — Ambulatory Visit (HOSPITAL_BASED_OUTPATIENT_CLINIC_OR_DEPARTMENT_OTHER): Payer: BLUE CROSS/BLUE SHIELD

## 2016-11-28 VITALS — BP 146/66 | HR 74 | Temp 97.8°F | Resp 17

## 2016-11-28 DIAGNOSIS — Z5111 Encounter for antineoplastic chemotherapy: Secondary | ICD-10-CM | POA: Diagnosis not present

## 2016-11-28 DIAGNOSIS — C3412 Malignant neoplasm of upper lobe, left bronchus or lung: Secondary | ICD-10-CM | POA: Diagnosis not present

## 2016-11-28 DIAGNOSIS — C3492 Malignant neoplasm of unspecified part of left bronchus or lung: Secondary | ICD-10-CM

## 2016-11-28 MED ORDER — DEXAMETHASONE SODIUM PHOSPHATE 10 MG/ML IJ SOLN
10.0000 mg | Freq: Once | INTRAMUSCULAR | Status: AC
  Administered 2016-11-28: 10 mg via INTRAVENOUS

## 2016-11-28 MED ORDER — SODIUM CHLORIDE 0.9% FLUSH
10.0000 mL | INTRAVENOUS | Status: DC | PRN
  Administered 2016-11-28: 10 mL
  Filled 2016-11-28: qty 10

## 2016-11-28 MED ORDER — HEPARIN SOD (PORK) LOCK FLUSH 100 UNIT/ML IV SOLN
500.0000 [IU] | Freq: Once | INTRAVENOUS | Status: AC | PRN
  Administered 2016-11-28: 500 [IU]
  Filled 2016-11-28: qty 5

## 2016-11-28 MED ORDER — SODIUM CHLORIDE 0.9 % IV SOLN
100.0000 mg/m2 | Freq: Once | INTRAVENOUS | Status: AC
  Administered 2016-11-28: 230 mg via INTRAVENOUS
  Filled 2016-11-28: qty 11.5

## 2016-11-28 MED ORDER — SODIUM CHLORIDE 0.9 % IV SOLN
Freq: Once | INTRAVENOUS | Status: AC
  Administered 2016-11-28: 13:00:00 via INTRAVENOUS

## 2016-11-28 MED ORDER — DEXAMETHASONE SODIUM PHOSPHATE 10 MG/ML IJ SOLN
INTRAMUSCULAR | Status: AC
  Filled 2016-11-28: qty 1

## 2016-11-28 NOTE — Telephone Encounter (Signed)
Appts scheduled per 4/25 los - patient to get new schedule in the treatment area - Per Normajean Baxter to let patient RN know to print out schedule.

## 2016-11-28 NOTE — Patient Instructions (Signed)
[facility] Discharge Instructions for Patients Receiving Chemotherapy  Today you received the following chemotherapy agents: Etoposide   To help prevent nausea and vomiting after your treatment, we encourage you to take your nausea medication as directed.    If you develop nausea and vomiting that is not controlled by your nausea medication, call the clinic.   BELOW ARE SYMPTOMS THAT SHOULD BE REPORTED IMMEDIATELY:  *FEVER GREATER THAN 100.5 F  *CHILLS WITH OR WITHOUT FEVER  NAUSEA AND VOMITING THAT IS NOT CONTROLLED WITH YOUR NAUSEA MEDICATION  *UNUSUAL SHORTNESS OF BREATH  *UNUSUAL BRUISING OR BLEEDING  TENDERNESS IN MOUTH AND THROAT WITH OR WITHOUT PRESENCE OF ULCERS  *URINARY PROBLEMS  *BOWEL PROBLEMS  UNUSUAL RASH Items with * indicate a potential emergency and should be followed up as soon as possible.  Feel free to call the clinic you have any questions or concerns. The clinic phone number is [phone].  Please show the CHEMO ALERT CARD at check-in to the Emergency Department and triage nurse.   

## 2016-12-01 ENCOUNTER — Ambulatory Visit: Payer: BLUE CROSS/BLUE SHIELD

## 2016-12-11 ENCOUNTER — Telehealth: Payer: Self-pay | Admitting: *Deleted

## 2016-12-11 NOTE — Telephone Encounter (Signed)
FYI "I'm to have a CT scan before I see Dr. Julien Nordmann on 12-17-2016 but have not heard anything yet.  I have contrast in hand."  Provided Radiology Central scheduling number.  Patient called.  CT has been scheduled 12-16-2016 at 11:30.

## 2016-12-16 ENCOUNTER — Ambulatory Visit (HOSPITAL_COMMUNITY)
Admission: RE | Admit: 2016-12-16 | Discharge: 2016-12-16 | Disposition: A | Discharge status: Home / Self Care | Payer: BLUE CROSS/BLUE SHIELD | Source: Ambulatory Visit | Attending: Internal Medicine | Admitting: Internal Medicine

## 2016-12-16 DIAGNOSIS — I2510 Atherosclerotic heart disease of native coronary artery without angina pectoris: Secondary | ICD-10-CM | POA: Insufficient documentation

## 2016-12-16 DIAGNOSIS — Z5111 Encounter for antineoplastic chemotherapy: Secondary | ICD-10-CM | POA: Insufficient documentation

## 2016-12-16 DIAGNOSIS — J439 Emphysema, unspecified: Secondary | ICD-10-CM | POA: Insufficient documentation

## 2016-12-16 DIAGNOSIS — I700 Atherosclerosis of aorta: Secondary | ICD-10-CM | POA: Diagnosis not present

## 2016-12-16 DIAGNOSIS — K5730 Diverticulosis of large intestine without perforation or abscess without bleeding: Secondary | ICD-10-CM | POA: Diagnosis not present

## 2016-12-16 DIAGNOSIS — M47896 Other spondylosis, lumbar region: Secondary | ICD-10-CM | POA: Diagnosis not present

## 2016-12-16 DIAGNOSIS — C3492 Malignant neoplasm of unspecified part of left bronchus or lung: Secondary | ICD-10-CM | POA: Insufficient documentation

## 2016-12-16 DIAGNOSIS — M5136 Other intervertebral disc degeneration, lumbar region: Secondary | ICD-10-CM | POA: Insufficient documentation

## 2016-12-16 DIAGNOSIS — R932 Abnormal findings on diagnostic imaging of liver and biliary tract: Secondary | ICD-10-CM | POA: Insufficient documentation

## 2016-12-16 MED ORDER — IOPAMIDOL (ISOVUE-300) INJECTION 61%
INTRAVENOUS | Status: AC
  Administered 2016-12-16: 100 mL
  Filled 2016-12-16: qty 100

## 2016-12-17 ENCOUNTER — Ambulatory Visit: Payer: BLUE CROSS/BLUE SHIELD

## 2016-12-17 ENCOUNTER — Ambulatory Visit (HOSPITAL_BASED_OUTPATIENT_CLINIC_OR_DEPARTMENT_OTHER): Payer: BLUE CROSS/BLUE SHIELD | Admitting: Internal Medicine

## 2016-12-17 ENCOUNTER — Encounter: Payer: Self-pay | Admitting: Internal Medicine

## 2016-12-17 ENCOUNTER — Ambulatory Visit (HOSPITAL_COMMUNITY)
Admission: RE | Admit: 2016-12-17 | Discharge: 2016-12-17 | Disposition: A | Discharge status: Home / Self Care | Payer: BLUE CROSS/BLUE SHIELD | Source: Ambulatory Visit | Attending: Internal Medicine | Admitting: Internal Medicine

## 2016-12-17 ENCOUNTER — Ambulatory Visit (HOSPITAL_BASED_OUTPATIENT_CLINIC_OR_DEPARTMENT_OTHER): Payer: BLUE CROSS/BLUE SHIELD

## 2016-12-17 ENCOUNTER — Other Ambulatory Visit (HOSPITAL_BASED_OUTPATIENT_CLINIC_OR_DEPARTMENT_OTHER): Payer: BLUE CROSS/BLUE SHIELD

## 2016-12-17 VITALS — BP 122/66 | HR 88 | Temp 97.7°F | Resp 20 | Ht 74.0 in | Wt 213.0 lb

## 2016-12-17 DIAGNOSIS — C3492 Malignant neoplasm of unspecified part of left bronchus or lung: Secondary | ICD-10-CM

## 2016-12-17 DIAGNOSIS — I10 Essential (primary) hypertension: Secondary | ICD-10-CM | POA: Diagnosis not present

## 2016-12-17 DIAGNOSIS — D701 Agranulocytosis secondary to cancer chemotherapy: Secondary | ICD-10-CM

## 2016-12-17 DIAGNOSIS — D649 Anemia, unspecified: Secondary | ICD-10-CM | POA: Insufficient documentation

## 2016-12-17 DIAGNOSIS — Z7901 Long term (current) use of anticoagulants: Secondary | ICD-10-CM

## 2016-12-17 DIAGNOSIS — D6481 Anemia due to antineoplastic chemotherapy: Secondary | ICD-10-CM

## 2016-12-17 DIAGNOSIS — C3412 Malignant neoplasm of upper lobe, left bronchus or lung: Secondary | ICD-10-CM

## 2016-12-17 DIAGNOSIS — C3402 Malignant neoplasm of left main bronchus: Secondary | ICD-10-CM

## 2016-12-17 DIAGNOSIS — I4891 Unspecified atrial fibrillation: Secondary | ICD-10-CM

## 2016-12-17 DIAGNOSIS — Z95828 Presence of other vascular implants and grafts: Secondary | ICD-10-CM

## 2016-12-17 DIAGNOSIS — Z7189 Other specified counseling: Secondary | ICD-10-CM

## 2016-12-17 DIAGNOSIS — I739 Peripheral vascular disease, unspecified: Secondary | ICD-10-CM

## 2016-12-17 DIAGNOSIS — Z5111 Encounter for antineoplastic chemotherapy: Secondary | ICD-10-CM

## 2016-12-17 DIAGNOSIS — I480 Paroxysmal atrial fibrillation: Secondary | ICD-10-CM

## 2016-12-17 DIAGNOSIS — T451X5A Adverse effect of antineoplastic and immunosuppressive drugs, initial encounter: Secondary | ICD-10-CM

## 2016-12-17 LAB — CBC WITH DIFFERENTIAL/PLATELET
BASO%: 0.5 % (ref 0.0–2.0)
Basophils Absolute: 0 10*3/uL (ref 0.0–0.1)
EOS%: 1 % (ref 0.0–7.0)
Eosinophils Absolute: 0 10*3/uL (ref 0.0–0.5)
HCT: 19.3 % — ABNORMAL LOW (ref 38.4–49.9)
HEMOGLOBIN: 6.2 g/dL — AB (ref 13.0–17.1)
LYMPH#: 0.8 10*3/uL — AB (ref 0.9–3.3)
LYMPH%: 37.1 % (ref 14.0–49.0)
MCH: 31.8 pg (ref 27.2–33.4)
MCHC: 32.1 g/dL (ref 32.0–36.0)
MCV: 99 fL — ABNORMAL HIGH (ref 79.3–98.0)
MONO#: 0.3 10*3/uL (ref 0.1–0.9)
MONO%: 14.4 % — AB (ref 0.0–14.0)
NEUT%: 47 % (ref 39.0–75.0)
NEUTROS ABS: 1 10*3/uL — AB (ref 1.5–6.5)
NRBC: 2 % — AB (ref 0–0)
Platelets: 165 10*3/uL (ref 140–400)
RBC: 1.95 10*6/uL — AB (ref 4.20–5.82)
RDW: 24.2 % — AB (ref 11.0–14.6)
WBC: 2 10*3/uL — AB (ref 4.0–10.3)

## 2016-12-17 LAB — COMPREHENSIVE METABOLIC PANEL
ALT: 13 U/L (ref 0–55)
AST: 19 U/L (ref 5–34)
Albumin: 3.7 g/dL (ref 3.5–5.0)
Alkaline Phosphatase: 62 U/L (ref 40–150)
Anion Gap: 8 mEq/L (ref 3–11)
BILIRUBIN TOTAL: 0.36 mg/dL (ref 0.20–1.20)
BUN: 11.1 mg/dL (ref 7.0–26.0)
CO2: 22 meq/L (ref 22–29)
Calcium: 9.4 mg/dL (ref 8.4–10.4)
Chloride: 108 mEq/L (ref 98–109)
Creatinine: 1.3 mg/dL (ref 0.7–1.3)
EGFR: 60 mL/min/{1.73_m2} — AB (ref >90)
GLUCOSE: 193 mg/dL — AB (ref 70–140)
Potassium: 4 mEq/L (ref 3.5–5.1)
SODIUM: 137 meq/L (ref 136–145)
TOTAL PROTEIN: 6.9 g/dL (ref 6.4–8.3)

## 2016-12-17 LAB — PREPARE RBC (CROSSMATCH)

## 2016-12-17 MED ORDER — HEPARIN SOD (PORK) LOCK FLUSH 100 UNIT/ML IV SOLN
250.0000 [IU] | INTRAVENOUS | Status: AC | PRN
  Administered 2016-12-17: 500 [IU]
  Filled 2016-12-17: qty 5

## 2016-12-17 MED ORDER — ACETAMINOPHEN 325 MG PO TABS
ORAL_TABLET | ORAL | Status: AC
  Filled 2016-12-17: qty 2

## 2016-12-17 MED ORDER — DIPHENHYDRAMINE HCL 25 MG PO CAPS
25.0000 mg | ORAL_CAPSULE | Freq: Once | ORAL | Status: AC
  Administered 2016-12-17: 25 mg via ORAL

## 2016-12-17 MED ORDER — SODIUM CHLORIDE 0.9% FLUSH
10.0000 mL | INTRAVENOUS | Status: DC | PRN
  Administered 2016-12-17: 10 mL via INTRAVENOUS
  Filled 2016-12-17: qty 10

## 2016-12-17 MED ORDER — SODIUM CHLORIDE 0.9% FLUSH
3.0000 mL | INTRAVENOUS | Status: AC | PRN
  Administered 2016-12-17: 10 mL
  Filled 2016-12-17: qty 10

## 2016-12-17 MED ORDER — ACETAMINOPHEN 325 MG PO TABS
650.0000 mg | ORAL_TABLET | Freq: Once | ORAL | Status: AC
  Administered 2016-12-17: 650 mg via ORAL

## 2016-12-17 MED ORDER — DIPHENHYDRAMINE HCL 25 MG PO CAPS
ORAL_CAPSULE | ORAL | Status: AC
  Filled 2016-12-17: qty 1

## 2016-12-17 NOTE — Patient Instructions (Signed)

## 2016-12-17 NOTE — Progress Notes (Signed)
[place] [phone]   [phone]  OFFICE PROGRESS NOTE  [name], MD [address] [place] [address]  DIAGNOSIS: Extensive stage (T4, N2, M1b) small cell lung cancer presented with large left upper lobe lung mass with mediastinal invasion as well as pancreatic lesion and suspicious left iliac bone metastasis diagnosed in February 2018.  PRIOR THERAPY: None  CURRENT THERAPY: Systemic chemotherapy with carboplatin for AUC of 5 on day 1 and etoposide 100 MG/M2 on days 1, 2 and 3 with Neulasta support status post 4 cycles. This is concurrent with radiotherapy. This also concurrent with radiation to the large lung mass.  INTERVAL HISTORY: Jesse Avila 66 y.o. male returns to the clinic today for follow-up visit accompanied by his wife. The patient is currently undergoing systemic chemotherapy with carboplatin and etoposide status post 4 cycles. He has been tolerating the treatment well with no significant adverse effects except for increasing fatigue. He denied having any chest pain but has shortness breath with exertion was no cough or hemoptysis. He has no fever or chills. He denied having any nausea, vomiting, diarrhea or constipation. He has occasional dizzy spells and intermittent headache. He had repeat CT scan of the chest, abdomen and pelvis performed recently and he is here for evaluation and discussion of his scan results.   MEDICAL HISTORY: Past Medical History:  Diagnosis Date  . Carotid artery occlusion   . DJD (degenerative joint disease) of knee   . Essential hypertension, benign    takes Benicar daily  . Goals of care, counseling/discussion 09/18/2016  . History of colon polyps    benign  . History of gout   . Hyperlipidemia    takes Fenofibrate and Crestor daily  . Insomnia    takes Ambien nightly as needed  . Joint pain   . Lung cancer ([place])   . Nocturia   . Peripheral vascular disease ([place])   .  Primary localized osteoarthritis of left knee 12/26/2014  . Stroke [facility]) 2015?   takes Plavix daily as well as Pletal,not on plavix at present 05/15/15  . 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  MEDICATIONS:  Current Outpatient Prescriptions  Medication Sig Dispense Refill  . apixaban (ELIQUIS) 5 MG TABS tablet Take 1 tablet (5 mg total) by mouth 2 (two) times daily. 60 tablet 3  . Blood Glucose Monitoring Suppl (ACCU-CHEK NANO SMARTVIEW) w/Device KIT by Does not apply route.    . diltiazem (CARDIZEM CD) 120 MG 24 hr capsule Take 1 capsule (120 mg total) by mouth daily. 30 capsule 2  . emollient (BIAFINE) cream Apply topically 2 (two) times daily.    . fenofibrate 160 MG tablet Take 160 mg by mouth daily.    Marland Kitchen glimepiride (AMARYL) 4 MG tablet Take 4 mg by mouth daily with breakfast.    . lidocaine-prilocaine (EMLA) cream Apply 1 application topically as needed. 30 g 0  . metFORMIN (GLUCOPHAGE) 500 MG tablet Take 1 tablet (500 mg total) by mouth 3 (three) times daily.    . metoprolol succinate (TOPROL-XL) 50 MG 24 hr tablet Take 50 mg by mouth daily. Take with or immediately following a meal.    . pioglitazone (ACTOS) 15 MG tablet Take 15 mg by mouth daily.    . prochlorperazine (COMPAZINE) 10  MG tablet Take 1 tablet (10 mg total) by mouth every 6 (six) hours as needed for nausea or vomiting. (Patient not taking: Reported on 11/26/2016) 30 tablet 0  . rosuvastatin (CRESTOR) 40 MG tablet Take 40 mg by mouth every evening. For cholesterol    . zolpidem (AMBIEN) 10 MG tablet Take 10 mg by mouth at bedtime as needed for sleep.      No current facility-administered medications for this visit.    Facility-Administered Medications Ordered in Other Visits  Medication Dose Route Frequency Provider Last Rate Last Dose  . heparin lock flush  100 unit/mL  500 Units Intracatheter Once PRN Curt Bears, MD      . sodium chloride flush (NS) 0.9 % injection 10 mL  10 mL Intracatheter PRN Curt Bears, MD   10 mL at 10/13/16 1618  . sodium chloride flush (NS) 0.9 % injection 10 mL  10 mL Intracatheter PRN Curt Bears, MD      . sodium chloride flush (NS) 0.9 % injection 10 mL  10 mL Intracatheter PRN Curt Bears, MD   10 mL at 11/26/16 1544    SURGICAL HISTORY:  Past Surgical History:  Procedure Laterality Date  . ABDOMINAL AORTAGRAM N/A 03/29/2012   Procedure: ABDOMINAL Maxcine Ham;  Surgeon: Angelia Mould, MD;  Location: [facility] CATH LAB;  Service: Cardiovascular;  Laterality: N/A;  . COLONOSCOPY    . ENDARTERECTOMY Right 05/09/2014   Procedure: RIGHT CAROTID ENDARTERECTOMY WITH PATCH ANGIOPLASTY;  Surgeon: Conrad Ellisville, MD;  Location: [place];  Service: Vascular;  Laterality: Right;  . ENDARTERECTOMY FEMORAL Right 05/17/2015   Procedure: ENDARTERECTOMY FEMORAL;  Surgeon: Angelia Mould, MD;  Location: [place];  Service: Vascular;  Laterality: Right;  . ENDOBRONCHIAL ULTRASOUND Bilateral 09/08/2016   Procedure: ENDOBRONCHIAL ULTRASOUND;  Surgeon: Rigoberto Noel, MD;  Location: [facility] ENDOSCOPY;  Service: Cardiopulmonary;  Laterality: Bilateral;  . FEMORAL-POPLITEAL BYPASS GRAFT Right 05/17/2015   Procedure: BYPASS GRAFT FEMORAL-BELOW KNEE POPLITEAL ARTERY, using gortex propaten graft 6 mm x 80 cm;  Surgeon: Angelia Mould, MD;  Location: [place];  Service: Vascular;  Laterality: Right;  . IR GENERIC HISTORICAL  09/25/2016   IR FLUORO GUIDE PORT INSERTION RIGHT 09/25/2016 Markus Daft, MD [facility]-INTERV RAD  . IR GENERIC HISTORICAL  09/25/2016   IR US GUIDE VASC ACCESS RIGHT 09/25/2016 Markus Daft, MD [facility]-INTERV RAD  . KNEE ARTHROSCOPY Right 11/2009  . LOWER EXTREMITY ANGIOGRAM Bilateral 03/23/2015   Procedure: Lower Extremity Angiogram;  Surgeon: Angelia Mould, MD;  Location: [place] CV LAB;  Service: Cardiovascular;   Laterality: Bilateral;  . MENISCUS REPAIR  11/2009  . PARTIAL KNEE ARTHROPLASTY Left 12/26/2014   Procedure: UNICOMPARTMENTAL KNEE;  Surgeon: Marchia Bond, MD;  Location: [place];  Service: Orthopedics;  Laterality: Left;  . PATCH ANGIOPLASTY Right 05/17/2015   Procedure: VEIN PATCH ANGIOPLASTY ILEOFEMORAL ARTERY;  Surgeon: Angelia Mould, MD;  Location: [place];  Service: Vascular;  Laterality: Right;  . PERCUTANEOUS STENT INTERVENTION N/A 05/10/2012   Procedure: PERCUTANEOUS STENT INTERVENTION;  Surgeon: Angelia Mould, MD;  Location: [facility] CATH LAB;  Service: Cardiovascular;  Laterality: N/A;  . PERIPHERAL VASCULAR CATHETERIZATION N/A 03/23/2015   Procedure: Abdominal Aortogram;  Surgeon: Angelia Mould, MD;  Location: [place] CV LAB;  Service: Cardiovascular;  Laterality: N/A;  . STENTS     PLACED IN ??BOTH LEGS   2013?    REVIEW OF SYSTEMS:  Constitutional: positive for fatigue Eyes: negative Ears, nose, mouth, throat, and face: negative  Respiratory: positive for dyspnea on exertion Cardiovascular: negative Gastrointestinal: negative Genitourinary:negative Integument/breast: negative Hematologic/lymphatic: negative Musculoskeletal:negative Neurological: positive for dizziness and headaches Behavioral/Psych: negative Endocrine: negative Allergic/Immunologic: negative   PHYSICAL EXAMINATION: General appearance: alert, cooperative, fatigued and no distress Head: Normocephalic, without obvious abnormality, atraumatic Neck: no adenopathy, no JVD, supple, symmetrical, trachea midline and thyroid not enlarged, symmetric, no tenderness/mass/nodules Lymph nodes: Cervical, supraclavicular, and axillary nodes normal. Resp: clear to auscultation bilaterally Back: symmetric, no curvature. ROM normal. No CVA tenderness. Cardio: regular rate and rhythm, S1, S2 normal, no murmur, click, rub or gallop GI: soft, non-tender; bowel sounds normal; no masses,  no  organomegaly Extremities: extremities normal, atraumatic, no cyanosis or edema Neurologic: Alert and oriented X 3, normal strength and tone. Normal symmetric reflexes. Normal coordination and gait  ECOG PERFORMANCE STATUS: 1 - Symptomatic but completely ambulatory  Blood pressure 122/66, pulse 88, temperature 97.7 F (36.5 C), temperature source Oral, resp. rate 20, height 6' 2" (1.88 m), weight 213 lb (96.6 kg), SpO2 100 %.  LABORATORY DATA: Lab Results  Component Value Date   WBC 2.0 (L) 12/17/2016   HGB 6.2 (LL) 12/17/2016   HCT 19.3 (L) 12/17/2016   MCV 99.0 (H) 12/17/2016   PLT 165 12/17/2016      Chemistry      Component Value Date/Time   NA 137 12/17/2016 1054   K 4.0 12/17/2016 1054   CL 101 10/22/2016 0914   CO2 22 12/17/2016 1054   BUN 11.1 12/17/2016 1054   CREATININE 1.3 12/17/2016 1054      Component Value Date/Time   CALCIUM 9.4 11/26/2016 1030   ALKPHOS 70 11/26/2016 1030   AST 15 11/26/2016 1030   ALT 17 11/26/2016 1030   BILITOT 0.41 11/26/2016 1030       RADIOGRAPHIC STUDIES: Ct Chest W Contrast  Result Date: 12/16/2016 CLINICAL DATA:  Small cell lung cancer diagnosed in 2018 with ongoing chemotherapy and completed radiotherapy. EXAM: CT CHEST, ABDOMEN, AND PELVIS WITH CONTRAST TECHNIQUE: Multidetector CT imaging of the chest, abdomen and pelvis was performed following the standard protocol during bolus administration of intravenous contrast. CONTRAST:  100 cc Isovue-300 COMPARISON:  Multiple exams, including 11/04/2016 FINDINGS: CT CHEST FINDINGS Cardiovascular: Coronary, aortic arch, and branch vessel atherosclerotic vascular disease. Mediastinum/Nodes: Left mediastinal and suprahilar mass 3.8 by 5.5 cm on image 20/2, previously 4.4 by 6.7 cm when measured in the same fashion on 11/04/2016. This extends into the AP window. Lungs/Pleura: Calcified bilateral pleural plaques. Confluent with the AP window and suprahilar mass there is abnormal band of density  in the left upper lobe with some associated volume loss, potentially representing a combination of tumor, atelectasis, and radiation pneumonitis. At the level of the aortic arch this band measures 2.3 cm in thickness, previously 2.6 cm. Continued central narrowing of the left upper lobe tracheobronchial tree. Continued interstitial accentuation in the left upper lobe. Paraseptal emphysema. Musculoskeletal: Lower thoracic spondylosis. CT ABDOMEN PELVIS FINDINGS Hepatobiliary: Prominent left hepatic lobe and caudate lobe raise the possibility of early cirrhosis based on morphology. Contracted gallbladder, as before. No biliary dilatation. Pancreas: Unremarkable Spleen: Unremarkable Adrenals/Urinary Tract: Unremarkable Stomach/Bowel: Sigmoid colon diverticulosis. Vascular/Lymphatic: Aortoiliac atherosclerotic vascular disease. There is some focal ectasia of the right common femoral artery with a chronically thrombosed superficial graft noted. No pathologic adenopathy. Reproductive: Unremarkable Other: Mild fatty prominence along the left omentum, causing bowel to appear somewhat displaced to the right. Musculoskeletal: Lower lumbar spondylosis and degenerative disc disease. Small umbilical hernia contains adipose tissue. Stable  faint sclerosis in the left iliac bone, uncertain significance, query treated osseous metastatic lesion. IMPRESSION: 1. Further reduction in size of the left suprahilar mass with involvement of the AP window, currently measuring 3.8 by 5.5. No new metastatic lesion is identified. Continued narrowing of the left upper lobe central tracheobronchial tree in the vicinity of the mass. 2. Hepatic morphology reason the possibility of early cirrhosis. 3. Aortic Atherosclerosis (ICD10-I70.0) and Emphysema (ICD10-J43.9). Coronary atherosclerosis. 4. Faint sclerosis in the left iliac bone is stable, query previously treated metastatic lesion. 5. Other imaging findings of potential clinical significance:  Sigmoid diverticulosis. 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 Fatty prominence the left omentum without a discrete mass identified. Lower lumbar spondylosis and degenerative disc disease. Calcified bilateral pleural plaques. Electronically Signed   By: Van Clines M.D.   On: 12/16/2016 13:42   Ct Abdomen Pelvis W Contrast  Result Date: 12/16/2016 CLINICAL DATA:  Small cell lung cancer diagnosed in 2018 with ongoing chemotherapy and completed radiotherapy. EXAM: CT CHEST, ABDOMEN, AND PELVIS WITH CONTRAST TECHNIQUE: Multidetector CT imaging of the chest, abdomen and pelvis was performed following the standard protocol during bolus administration of intravenous contrast. CONTRAST:  100 cc Isovue-300 COMPARISON:  Multiple exams, including 11/04/2016 FINDINGS: CT CHEST FINDINGS Cardiovascular: Coronary, aortic arch, and branch vessel atherosclerotic vascular disease. Mediastinum/Nodes: Left mediastinal and suprahilar mass 3.8 by 5.5 cm on image 20/2, previously 4.4 by 6.7 cm when measured in the same fashion on 11/04/2016. This extends into the AP window. Lungs/Pleura: Calcified bilateral pleural plaques. Confluent with the AP window and suprahilar mass there is abnormal band of density in the left upper lobe with some associated volume loss, potentially representing a combination of tumor, atelectasis, and radiation pneumonitis. At the level of the aortic arch this band measures 2.3 cm in thickness, previously 2.6 cm. Continued central narrowing of the left upper lobe tracheobronchial tree. Continued interstitial accentuation in the left upper lobe. Paraseptal emphysema. Musculoskeletal: Lower thoracic spondylosis. CT ABDOMEN PELVIS FINDINGS Hepatobiliary: Prominent left hepatic lobe and caudate lobe raise the possibility of early cirrhosis based on morphology. Contracted gallbladder, as before. No biliary dilatation. Pancreas: Unremarkable Spleen: Unremarkable Adrenals/Urinary  Tract: Unremarkable Stomach/Bowel: Sigmoid colon diverticulosis. Vascular/Lymphatic: Aortoiliac atherosclerotic vascular disease. There is some focal ectasia of the right common femoral artery with a chronically thrombosed superficial graft noted. No pathologic adenopathy. Reproductive: Unremarkable Other: Mild fatty prominence along the left omentum, causing bowel to appear somewhat displaced to the right. Musculoskeletal: Lower lumbar spondylosis and degenerative disc disease. Small umbilical hernia contains adipose tissue. Stable faint sclerosis in the left iliac bone, uncertain significance, query treated osseous metastatic lesion. IMPRESSION: 1. Further reduction in size of the left suprahilar mass with involvement of the AP window, currently measuring 3.8 by 5.5. No new metastatic lesion is identified. Continued narrowing of the left upper lobe central tracheobronchial tree in the vicinity of the mass. 2. Hepatic morphology reason the possibility of early cirrhosis. 3. Aortic Atherosclerosis (ICD10-I70.0) and Emphysema (ICD10-J43.9). Coronary atherosclerosis. 4. Faint sclerosis in the left iliac bone is stable, query previously treated metastatic lesion. 5. Other imaging findings of potential clinical significance: Sigmoid diverticulosis. 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 Fatty prominence the left omentum without a discrete mass identified. Lower lumbar spondylosis and degenerative disc disease. Calcified bilateral pleural plaques. Electronically Signed   By: Van Clines M.D.   On: 12/16/2016 13:42    ASSESSMENT AND PLAN:  This is a very pleasant 66 years old  white male with extensive stage small cell lung cancer. He is currently undergoing treatment with systemic chemotherapy with carboplatin and etoposide status post 4 cycles. This was also concurrent with radiotherapy to the lung mass which was completed recently. The patient has been tolerating his systemic  chemotherapy fairly well with no significant adverse effects except for fatigue. He had repeat CT scan of the chest, abdomen and pelvis performed recently. I personally and independently reviewed the scan images and discuss the results with the patient and his wife. His CT scan continues to show evidence for disease improvement. I recommended for the patient to continue his systemic chemotherapy was carboplatin and etoposide for 2 more cycles. We will delay the start of cycle #5 until treatment of his anemia and neutropenia. For the chemotherapy-induced anemia, I will arrange for the patient to receive 2 units of PRBCs transfusion today. For hypertension, the patient will continue his current treatment with Cardizem CD as well as Toprol-XL. For the history of atrial fibrillation, he is currently on Eliquis. He would come back for follow-up visit in 4 weeks with the start of cycle #6. The patient was advised to call immediately if he has any concerning symptoms in the interval. The patient voices understanding of current disease status and treatment options and is in agreement with the current care plan. All questions were answered. The patient knows to call the clinic with any problems, questions or concerns. We can certainly see the patient much sooner if necessary. I spent 15 minutes counseling the patient face to face. The total time spent in the appointment was 25 minutes. Disclaimer: This note was dictated with voice recognition software. Similar sounding words can inadvertently be transcribed and may not be corrected upon review.

## 2016-12-17 NOTE — Patient Instructions (Signed)
Blood Transfusion , Adult A blood transfusion is a procedure in which you receive donated blood, including plasma, platelets, and red blood cells, through an IV tube. You may need a blood transfusion because of illness, surgery, or injury. The blood may come from a donor. You may also be able to donate blood for yourself (autologous blood donation) before a surgery if you know that you might require a blood transfusion. The blood given in a transfusion is made up of different types of cells. You may receive:  Red blood cells. These carry oxygen to the cells in the body.  White blood cells. These help you fight infections.  Platelets. These help your blood to clot.  Plasma. This is the liquid part of your blood and it helps with fluid imbalances. If you have hemophilia or another clotting disorder, you may also receive other types of blood products. Tell a health care provider about:  Any allergies you have.  All medicines you are taking, including vitamins, herbs, eye drops, creams, and over-the-counter medicines.  Any problems you or family members have had with anesthetic medicines.  Any blood disorders you have.  Any surgeries you have had.  Any medical conditions you have, including any recent fever or cold symptoms.  Whether you are pregnant or may be pregnant.  Any previous reactions you have had during a blood transfusion. What are the risks? Generally, this is a safe procedure. However, problems may occur, including:  Having an allergic reaction to something in the donated blood. Hives and itching may be symptoms of this type of reaction.  Fever. This may be a reaction to the white blood cells in the transfused blood. Nausea or chest pain may accompany a fever.  Iron overload. This can happen from having many transfusions.  Transfusion-related acute lung injury (TRALI). This is a rare reaction that causes lung damage. The cause is not known.TRALI can occur within hours  of a transfusion or several days later.  Sudden (acute) or delayed hemolytic reactions. This happens if your blood does not match the cells in your transfusion. Your body's defense system (immune system) may try to attack the new cells. This complication is rare. The symptoms include fever, chills, nausea, and low back pain or chest pain.  Infection or disease transmission. This is rare. What happens before the procedure?  You will have a blood test to determine your blood type. This is necessary to know what kind of blood your body will accept and to match it to the donor blood.  If you are going to have a planned surgery, you may be able to do an autologous blood donation. This may be done in case you need to have a transfusion.  If you have had an allergic reaction to a transfusion in the past, you may be given medicine to help prevent a reaction. This medicine may be given to you by mouth or through an IV tube.  You will have your temperature, blood pressure, and pulse monitored before the transfusion.  Follow instructions from your health care provider about eating and drinking restrictions.  Ask your health care provider about:  Changing or stopping your regular medicines. This is especially important if you are taking diabetes medicines or blood thinners.  Taking medicines such as aspirin and ibuprofen. These medicines can thin your blood. Do not take these medicines before your procedure if your health care provider instructs you not to. What happens during the procedure?  An IV tube will be   inserted into one of your veins.  The bag of donated blood will be attached to your IV tube. The blood will then enter through your vein.  Your temperature, blood pressure, and pulse will be monitored regularly during the transfusion. This monitoring is done to detect early signs of a transfusion reaction.  If you have any signs or symptoms of a reaction, your transfusion will be stopped and  you may be given medicine.  When the transfusion is complete, your IV tube will be removed.  Pressure may be applied to the IV site for a few minutes.  A bandage (dressing) will be applied. The procedure may vary among health care providers and hospitals. What happens after the procedure?  Your temperature, blood pressure, heart rate, breathing rate, and blood oxygen level will be monitored often.  Your blood may be tested to see how you are responding to the transfusion.  You may be warmed with fluids or blankets to maintain a normal body temperature. Summary  A blood transfusion is a procedure in which you receive donated blood, including plasma, platelets, and red blood cells, through an IV tube.  Your temperature, blood pressure, and pulse will be monitored before, during, and after the transfusion.  Your blood may be tested after the transfusion to see how your body has responded. This information is not intended to replace advice given to you by your health care provider. Make sure you discuss any questions you have with your health care provider. Document Released: 07/18/2000 Document Revised: 04/17/2016 Document Reviewed: 04/17/2016 Elsevier Interactive Patient Education  2017 [employer]  

## 2016-12-18 ENCOUNTER — Ambulatory Visit: Payer: BLUE CROSS/BLUE SHIELD

## 2016-12-18 LAB — BPAM RBC
Blood Product Expiration Date: 201805182359
Blood Product Expiration Date: 201806062359
ISSUE DATE / TIME: 201805161440
ISSUE DATE / TIME: 201805161440
Unit Type and Rh: 5100
Unit Type and Rh: 5100

## 2016-12-18 LAB — TYPE AND SCREEN
ABO/RH(D): O POS
Antibody Screen: NEGATIVE
Unit division: 0
Unit division: 0

## 2016-12-19 ENCOUNTER — Telehealth: Payer: Self-pay | Admitting: Internal Medicine

## 2016-12-19 ENCOUNTER — Ambulatory Visit: Payer: BLUE CROSS/BLUE SHIELD

## 2016-12-19 NOTE — Telephone Encounter (Signed)
Scheduled appt per sch message from Rn Diane - left message for patient with appt date and time.

## 2016-12-19 NOTE — Telephone Encounter (Signed)
Scheduled appt per 5/16 los as well - patient to pick up new schedule next visit.

## 2016-12-22 ENCOUNTER — Encounter: Payer: Self-pay | Admitting: Oncology

## 2016-12-23 ENCOUNTER — Telehealth: Payer: Self-pay | Admitting: Internal Medicine

## 2016-12-23 NOTE — Telephone Encounter (Signed)
Spoke with patient re appointments this week 5/23 thru 5/26. Patient aware to see me tomorrow and if he is able to proceed with treatment tomorrow we will reschedule the remaining appointments.

## 2016-12-24 ENCOUNTER — Other Ambulatory Visit (HOSPITAL_BASED_OUTPATIENT_CLINIC_OR_DEPARTMENT_OTHER): Payer: BLUE CROSS/BLUE SHIELD

## 2016-12-24 ENCOUNTER — Ambulatory Visit (HOSPITAL_BASED_OUTPATIENT_CLINIC_OR_DEPARTMENT_OTHER): Payer: BLUE CROSS/BLUE SHIELD

## 2016-12-24 VITALS — BP 113/59 | HR 70 | Temp 98.8°F | Resp 18

## 2016-12-24 DIAGNOSIS — C3492 Malignant neoplasm of unspecified part of left bronchus or lung: Secondary | ICD-10-CM

## 2016-12-24 DIAGNOSIS — Z7189 Other specified counseling: Secondary | ICD-10-CM

## 2016-12-24 DIAGNOSIS — I10 Essential (primary) hypertension: Secondary | ICD-10-CM

## 2016-12-24 DIAGNOSIS — I739 Peripheral vascular disease, unspecified: Secondary | ICD-10-CM

## 2016-12-24 DIAGNOSIS — C3412 Malignant neoplasm of upper lobe, left bronchus or lung: Secondary | ICD-10-CM | POA: Diagnosis not present

## 2016-12-24 DIAGNOSIS — Z5111 Encounter for antineoplastic chemotherapy: Secondary | ICD-10-CM

## 2016-12-24 LAB — CBC WITH DIFFERENTIAL/PLATELET
BASO%: 2.3 % — ABNORMAL HIGH (ref 0.0–2.0)
BASOS ABS: 0.1 10*3/uL (ref 0.0–0.1)
EOS ABS: 0 10*3/uL (ref 0.0–0.5)
EOS%: 1.2 % (ref 0.0–7.0)
HCT: 25.9 % — ABNORMAL LOW (ref 38.4–49.9)
HGB: 8.6 g/dL — ABNORMAL LOW (ref 13.0–17.1)
LYMPH%: 21.8 % (ref 14.0–49.0)
MCH: 31.8 pg (ref 27.2–33.4)
MCHC: 33.4 g/dL (ref 32.0–36.0)
MCV: 95.1 fL (ref 79.3–98.0)
MONO#: 0.5 10*3/uL (ref 0.1–0.9)
MONO%: 13.1 % (ref 0.0–14.0)
NEUT#: 2.5 10*3/uL (ref 1.5–6.5)
NEUT%: 61.6 % (ref 39.0–75.0)
Platelets: 164 10*3/uL (ref 140–400)
RBC: 2.72 10*6/uL — ABNORMAL LOW (ref 4.20–5.82)
RDW: 24.2 % — AB (ref 11.0–14.6)
WBC: 4.1 10*3/uL (ref 4.0–10.3)
lymph#: 0.9 10*3/uL (ref 0.9–3.3)

## 2016-12-24 LAB — COMPREHENSIVE METABOLIC PANEL
ALK PHOS: 55 U/L (ref 40–150)
ALT: 14 U/L (ref 0–55)
AST: 15 U/L (ref 5–34)
Albumin: 3.7 g/dL (ref 3.5–5.0)
Anion Gap: 11 mEq/L (ref 3–11)
BUN: 12.6 mg/dL (ref 7.0–26.0)
CHLORIDE: 107 meq/L (ref 98–109)
CO2: 23 mEq/L (ref 22–29)
Calcium: 9.4 mg/dL (ref 8.4–10.4)
Creatinine: 1.3 mg/dL (ref 0.7–1.3)
EGFR: 61 mL/min/{1.73_m2} — ABNORMAL LOW (ref >90)
GLUCOSE: 167 mg/dL — AB (ref 70–140)
POTASSIUM: 4.2 meq/L (ref 3.5–5.1)
Sodium: 141 mEq/L (ref 136–145)
Total Bilirubin: 0.69 mg/dL (ref 0.20–1.20)
Total Protein: 6.8 g/dL (ref 6.4–8.3)

## 2016-12-24 LAB — TECHNOLOGIST REVIEW

## 2016-12-24 MED ORDER — SODIUM CHLORIDE 0.9 % IV SOLN
420.0000 mg | Freq: Once | INTRAVENOUS | Status: AC
  Administered 2016-12-24: 420 mg via INTRAVENOUS
  Filled 2016-12-24: qty 42

## 2016-12-24 MED ORDER — DEXAMETHASONE SODIUM PHOSPHATE 10 MG/ML IJ SOLN
INTRAMUSCULAR | Status: AC
  Filled 2016-12-24: qty 1

## 2016-12-24 MED ORDER — SODIUM CHLORIDE 0.9 % IV SOLN
Freq: Once | INTRAVENOUS | Status: AC
  Administered 2016-12-24: 11:00:00 via INTRAVENOUS

## 2016-12-24 MED ORDER — PALONOSETRON HCL INJECTION 0.25 MG/5ML
INTRAVENOUS | Status: AC
  Filled 2016-12-24: qty 5

## 2016-12-24 MED ORDER — PALONOSETRON HCL INJECTION 0.25 MG/5ML
0.2500 mg | Freq: Once | INTRAVENOUS | Status: AC
  Administered 2016-12-24: 0.25 mg via INTRAVENOUS

## 2016-12-24 MED ORDER — DEXAMETHASONE SODIUM PHOSPHATE 10 MG/ML IJ SOLN
10.0000 mg | Freq: Once | INTRAMUSCULAR | Status: AC
Start: 2016-12-24 — End: 2016-12-24
  Administered 2016-12-24: 10 mg via INTRAVENOUS

## 2016-12-24 MED ORDER — ETOPOSIDE CHEMO INJECTION 1 GM/50ML
100.0000 mg/m2 | Freq: Once | INTRAVENOUS | Status: AC
  Administered 2016-12-24: 230 mg via INTRAVENOUS
  Filled 2016-12-24: qty 11.5

## 2016-12-24 MED ORDER — SODIUM CHLORIDE 0.9% FLUSH
10.0000 mL | INTRAVENOUS | Status: DC | PRN
  Administered 2016-12-24: 10 mL
  Filled 2016-12-24: qty 10

## 2016-12-24 MED ORDER — HEPARIN SOD (PORK) LOCK FLUSH 100 UNIT/ML IV SOLN
500.0000 [IU] | Freq: Once | INTRAVENOUS | Status: AC | PRN
  Administered 2016-12-24: 500 [IU]
  Filled 2016-12-24: qty 5

## 2016-12-24 NOTE — Patient Instructions (Signed)
[facility] Discharge Instructions for Patients Receiving Chemotherapy  Today you received the following chemotherapy agents: Carboplatin and Etoposide.  To help prevent nausea and vomiting after your treatment, we encourage you to take your nausea medication as needed   If you develop nausea and vomiting that is not controlled by your nausea medication, call the clinic.   BELOW ARE SYMPTOMS THAT SHOULD BE REPORTED IMMEDIATELY:  *FEVER GREATER THAN 100.5 F  *CHILLS WITH OR WITHOUT FEVER  NAUSEA AND VOMITING THAT IS NOT CONTROLLED WITH YOUR NAUSEA MEDICATION  *UNUSUAL SHORTNESS OF BREATH  *UNUSUAL BRUISING OR BLEEDING  TENDERNESS IN MOUTH AND THROAT WITH OR WITHOUT PRESENCE OF ULCERS  *URINARY PROBLEMS  *BOWEL PROBLEMS  UNUSUAL RASH Items with * indicate a potential emergency and should be followed up as soon as possible.  Feel free to call the clinic you have any questions or concerns. The clinic phone number is [phone].  Please show the [place] at check-in to the Emergency Department and triage nurse.

## 2016-12-25 ENCOUNTER — Ambulatory Visit (HOSPITAL_BASED_OUTPATIENT_CLINIC_OR_DEPARTMENT_OTHER): Payer: BLUE CROSS/BLUE SHIELD

## 2016-12-25 ENCOUNTER — Ambulatory Visit
Admission: RE | Admit: 2016-12-25 | Discharge: 2016-12-25 | Disposition: A | Discharge status: Home / Self Care | Payer: BLUE CROSS/BLUE SHIELD | Source: Ambulatory Visit | Attending: Radiation Oncology | Admitting: Radiation Oncology

## 2016-12-25 ENCOUNTER — Telehealth: Payer: Self-pay

## 2016-12-25 ENCOUNTER — Other Ambulatory Visit: Payer: BLUE CROSS/BLUE SHIELD

## 2016-12-25 ENCOUNTER — Other Ambulatory Visit: Payer: Self-pay | Admitting: Internal Medicine

## 2016-12-25 VITALS — BP 140/77 | HR 88 | Temp 97.6°F | Resp 18

## 2016-12-25 VITALS — BP 127/79 | HR 92 | Temp 98.1°F | Resp 20 | Wt 222.6 lb

## 2016-12-25 DIAGNOSIS — C3492 Malignant neoplasm of unspecified part of left bronchus or lung: Secondary | ICD-10-CM | POA: Diagnosis not present

## 2016-12-25 DIAGNOSIS — Z7984 Long term (current) use of oral hypoglycemic drugs: Secondary | ICD-10-CM | POA: Diagnosis not present

## 2016-12-25 DIAGNOSIS — Z510 Encounter for antineoplastic radiation therapy: Secondary | ICD-10-CM | POA: Diagnosis present

## 2016-12-25 DIAGNOSIS — C3412 Malignant neoplasm of upper lobe, left bronchus or lung: Secondary | ICD-10-CM | POA: Diagnosis not present

## 2016-12-25 DIAGNOSIS — Z888 Allergy status to other drugs, medicaments and biological substances status: Secondary | ICD-10-CM | POA: Diagnosis not present

## 2016-12-25 DIAGNOSIS — Z7901 Long term (current) use of anticoagulants: Secondary | ICD-10-CM | POA: Diagnosis not present

## 2016-12-25 DIAGNOSIS — Z79899 Other long term (current) drug therapy: Secondary | ICD-10-CM | POA: Insufficient documentation

## 2016-12-25 DIAGNOSIS — Z5111 Encounter for antineoplastic chemotherapy: Secondary | ICD-10-CM | POA: Diagnosis not present

## 2016-12-25 MED ORDER — OXYCODONE-ACETAMINOPHEN 5-325 MG PO TABS: 1.0000 | ORAL_TABLET | Freq: Four times a day (QID) | ORAL | 0 refills | Status: DC | PRN

## 2016-12-25 MED ORDER — SODIUM CHLORIDE 0.9 % IV SOLN
100.0000 mg/m2 | Freq: Once | INTRAVENOUS | Status: AC
  Administered 2016-12-25: 230 mg via INTRAVENOUS
  Filled 2016-12-25: qty 11.5

## 2016-12-25 MED ORDER — SODIUM CHLORIDE 0.9% FLUSH
10.0000 mL | INTRAVENOUS | Status: DC | PRN
  Administered 2016-12-25: 10 mL
  Filled 2016-12-25: qty 10

## 2016-12-25 MED ORDER — DEXAMETHASONE SODIUM PHOSPHATE 10 MG/ML IJ SOLN
INTRAMUSCULAR | Status: AC
  Filled 2016-12-25: qty 1

## 2016-12-25 MED ORDER — SODIUM CHLORIDE 0.9 % IV SOLN
Freq: Once | INTRAVENOUS | Status: AC
  Administered 2016-12-25: 11:00:00 via INTRAVENOUS

## 2016-12-25 MED ORDER — HEPARIN SOD (PORK) LOCK FLUSH 100 UNIT/ML IV SOLN
500.0000 [IU] | Freq: Once | INTRAVENOUS | Status: AC | PRN
  Administered 2016-12-25: 500 [IU]
  Filled 2016-12-25: qty 5

## 2016-12-25 MED ORDER — DEXAMETHASONE SODIUM PHOSPHATE 10 MG/ML IJ SOLN
10.0000 mg | Freq: Once | INTRAMUSCULAR | Status: AC
  Administered 2016-12-25: 10 mg via INTRAVENOUS

## 2016-12-25 NOTE — Progress Notes (Signed)
Radiation Oncology         [phone] ________________________________  Name: Jesse Avila MRN: [id number]  Date: 12/25/2016  DOB: March 08, 1951  Follow-Up Visit Note  CC: [name], MD  Rigoberto Noel, MD    ICD-9-CM ICD-10-CM   1. Small cell lung carcinoma, left (HCC) 162.9 C34.92     Diagnosis:   Limited versus "early" extensive stage small cell lung cancer (T4, N2, M1b)  Interval Since Last Radiation:  6 weeks  Narrative:  The patient returns today for routine follow-up.  Patient denies pain but does endorse headaches following chemotherapy treatments, especially when Hb is low. He endorses an occasional dry cough. He states symptoms improve after infusions. He denies swallowing and/or breathing problems.                         ALLERGIES:  is allergic to cialis [tadalafil].  Meds: Current Outpatient Prescriptions  Medication Sig Dispense Refill  . apixaban (ELIQUIS) 5 MG TABS tablet Take 1 tablet (5 mg total) by mouth 2 (two) times daily. 60 tablet 3  . Blood Glucose Monitoring Suppl (ACCU-CHEK NANO SMARTVIEW) w/Device KIT by Does not apply route.    . diltiazem (CARDIZEM CD) 120 MG 24 hr capsule Take 1 capsule (120 mg total) by mouth daily. 30 capsule 2  . fenofibrate 160 MG tablet Take 160 mg by mouth daily.    Marland Kitchen glimepiride (AMARYL) 4 MG tablet Take 4 mg by mouth daily with breakfast.    . lidocaine-prilocaine (EMLA) cream Apply 1 application topically as needed. 30 g 0  . metFORMIN (GLUCOPHAGE) 500 MG tablet Take 1 tablet (500 mg total) by mouth 3 (three) times daily.    . metoprolol succinate (TOPROL-XL) 50 MG 24 hr tablet Take 50 mg by mouth daily. Take with or immediately following a meal.    . pioglitazone (ACTOS) 15 MG tablet Take 15 mg by mouth daily.    . rosuvastatin (CRESTOR) 40 MG tablet Take 40 mg by mouth every evening. For cholesterol    . zolpidem (AMBIEN) 10 MG tablet Take 10 mg by mouth at bedtime as needed for sleep.     Marland Kitchen emollient (BIAFINE) cream  Apply topically 2 (two) times daily.    . prochlorperazine (COMPAZINE) 10 MG tablet Take 1 tablet (10 mg total) by mouth every 6 (six) hours as needed for nausea or vomiting. (Patient not taking: Reported on 11/26/2016) 30 tablet 0   No current facility-administered medications for this encounter.    Facility-Administered Medications Ordered in Other Encounters  Medication Dose Route Frequency Provider Last Rate Last Dose  . heparin lock flush 100 unit/mL  500 Units Intracatheter Once PRN Curt Bears, MD      . sodium chloride flush (NS) 0.9 % injection 10 mL  10 mL Intracatheter PRN Curt Bears, MD   10 mL at 10/13/16 1618  . sodium chloride flush (NS) 0.9 % injection 10 mL  10 mL Intracatheter PRN Curt Bears, MD      . sodium chloride flush (NS) 0.9 % injection 10 mL  10 mL Intracatheter PRN Curt Bears, MD   10 mL at 11/26/16 1544    Physical Findings: The patient is in no acute distress. Patient is alert and oriented.  weight is 222 lb 9.6 oz (101 kg). His oral temperature is 98.1 F (36.7 C). His blood pressure is 127/79 and his pulse is 92. His respiration is 20 and oxygen saturation is 100%. Marland Kitchen  Lungs are clear to auscultation bilaterally. Heart has regular rate and rhythm. No palpable cervical, supraclavicular, or axillary adenopathy. Abdomen soft, non-tender, normal bowel sounds.   Lab Findings: Lab Results  Component Value Date   WBC 4.1 12/24/2016   HGB 8.6 (L) 12/24/2016   HCT 25.9 (L) 12/24/2016   MCV 95.1 12/24/2016   PLT 164 12/24/2016    Radiographic Findings: Ct Chest W Contrast  Result Date: 12/16/2016 CLINICAL DATA:  Small cell lung cancer diagnosed in 2018 with ongoing chemotherapy and completed radiotherapy. EXAM: CT CHEST, ABDOMEN, AND PELVIS WITH CONTRAST TECHNIQUE: Multidetector CT imaging of the chest, abdomen and pelvis was performed following the standard protocol during bolus administration of intravenous contrast. CONTRAST:  100 cc  Isovue-300 COMPARISON:  Multiple exams, including 11/04/2016 FINDINGS: CT CHEST FINDINGS Cardiovascular: Coronary, aortic arch, and branch vessel atherosclerotic vascular disease. Mediastinum/Nodes: Left mediastinal and suprahilar mass 3.8 by 5.5 cm on image 20/2, previously 4.4 by 6.7 cm when measured in the same fashion on 11/04/2016. This extends into the AP window. Lungs/Pleura: Calcified bilateral pleural plaques. Confluent with the AP window and suprahilar mass there is abnormal band of density in the left upper lobe with some associated volume loss, potentially representing a combination of tumor, atelectasis, and radiation pneumonitis. At the level of the aortic arch this band measures 2.3 cm in thickness, previously 2.6 cm. Continued central narrowing of the left upper lobe tracheobronchial tree. Continued interstitial accentuation in the left upper lobe. Paraseptal emphysema. Musculoskeletal: Lower thoracic spondylosis. CT ABDOMEN PELVIS FINDINGS Hepatobiliary: Prominent left hepatic lobe and caudate lobe raise the possibility of early cirrhosis based on morphology. Contracted gallbladder, as before. No biliary dilatation. Pancreas: Unremarkable Spleen: Unremarkable Adrenals/Urinary Tract: Unremarkable Stomach/Bowel: Sigmoid colon diverticulosis. Vascular/Lymphatic: Aortoiliac atherosclerotic vascular disease. There is some focal ectasia of the right common femoral artery with a chronically thrombosed superficial graft noted. No pathologic adenopathy. Reproductive: Unremarkable Other: Mild fatty prominence along the left omentum, causing bowel to appear somewhat displaced to the right. Musculoskeletal: Lower lumbar spondylosis and degenerative disc disease. Small umbilical hernia contains adipose tissue. Stable faint sclerosis in the left iliac bone, uncertain significance, query treated osseous metastatic lesion. IMPRESSION: 1. Further reduction in size of the left suprahilar mass with involvement of the  AP window, currently measuring 3.8 by 5.5. No new metastatic lesion is identified. Continued narrowing of the left upper lobe central tracheobronchial tree in the vicinity of the mass. 2. Hepatic morphology reason the possibility of early cirrhosis. 3. Aortic Atherosclerosis (ICD10-I70.0) and Emphysema (ICD10-J43.9). Coronary atherosclerosis. 4. Faint sclerosis in the left iliac bone is stable, query previously treated metastatic lesion. 5. Other imaging findings of potential clinical significance: Sigmoid diverticulosis. 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 Fatty prominence the left omentum without a discrete mass identified. Lower lumbar spondylosis and degenerative disc disease. Calcified bilateral pleural plaques. Electronically Signed   By: Van Clines M.D.   On: 12/16/2016 13:42   Ct Abdomen Pelvis W Contrast  Result Date: 12/16/2016 CLINICAL DATA:  Small cell lung cancer diagnosed in 2018 with ongoing chemotherapy and completed radiotherapy. EXAM: CT CHEST, ABDOMEN, AND PELVIS WITH CONTRAST TECHNIQUE: Multidetector CT imaging of the chest, abdomen and pelvis was performed following the standard protocol during bolus administration of intravenous contrast. CONTRAST:  100 cc Isovue-300 COMPARISON:  Multiple exams, including 11/04/2016 FINDINGS: CT CHEST FINDINGS Cardiovascular: Coronary, aortic arch, and branch vessel atherosclerotic vascular disease. Mediastinum/Nodes: Left mediastinal and suprahilar mass 3.8 by 5.5 cm on image 20/2, previously  4.4 by 6.7 cm when measured in the same fashion on 11/04/2016. This extends into the AP window. Lungs/Pleura: Calcified bilateral pleural plaques. Confluent with the AP window and suprahilar mass there is abnormal band of density in the left upper lobe with some associated volume loss, potentially representing a combination of tumor, atelectasis, and radiation pneumonitis. At the level of the aortic arch this band measures 2.3 cm  in thickness, previously 2.6 cm. Continued central narrowing of the left upper lobe tracheobronchial tree. Continued interstitial accentuation in the left upper lobe. Paraseptal emphysema. Musculoskeletal: Lower thoracic spondylosis. CT ABDOMEN PELVIS FINDINGS Hepatobiliary: Prominent left hepatic lobe and caudate lobe raise the possibility of early cirrhosis based on morphology. Contracted gallbladder, as before. No biliary dilatation. Pancreas: Unremarkable Spleen: Unremarkable Adrenals/Urinary Tract: Unremarkable Stomach/Bowel: Sigmoid colon diverticulosis. Vascular/Lymphatic: Aortoiliac atherosclerotic vascular disease. There is some focal ectasia of the right common femoral artery with a chronically thrombosed superficial graft noted. No pathologic adenopathy. Reproductive: Unremarkable Other: Mild fatty prominence along the left omentum, causing bowel to appear somewhat displaced to the right. Musculoskeletal: Lower lumbar spondylosis and degenerative disc disease. Small umbilical hernia contains adipose tissue. Stable faint sclerosis in the left iliac bone, uncertain significance, query treated osseous metastatic lesion. IMPRESSION: 1. Further reduction in size of the left suprahilar mass with involvement of the AP window, currently measuring 3.8 by 5.5. No new metastatic lesion is identified. Continued narrowing of the left upper lobe central tracheobronchial tree in the vicinity of the mass. 2. Hepatic morphology reason the possibility of early cirrhosis. 3. Aortic Atherosclerosis (ICD10-I70.0) and Emphysema (ICD10-J43.9). Coronary atherosclerosis. 4. Faint sclerosis in the left iliac bone is stable, query previously treated metastatic lesion. 5. Other imaging findings of potential clinical significance: Sigmoid diverticulosis. 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 Fatty prominence the left omentum without a discrete mass identified. Lower lumbar spondylosis and degenerative  disc disease. Calcified bilateral pleural plaques. Electronically Signed   By: Van Clines M.D.   On: 12/16/2016 13:42    Impression:  The patient is recovering from the effects of radiation. Patient is swallowing foods well and reports no breathing difficulties.   Plan:  Patient will continue on chemotherapy under the supervision of Dr. Julien Nordmann. He will follow up with medical oncology. Prn follow-up in rad/onc. Possible PCI patient if he achieves complete remission with chemotherapy.  -----------------------------------  Blair Promise, PhD, MD  This document serves as a record of services personally performed by Gery Pray, MD. It was created on his behalf by Linward Natal, a trained medical scribe. The creation of this record is based on the scribe's personal observations and the provider's statements to them. This document has been checked and approved by the attending provider.

## 2016-12-25 NOTE — Telephone Encounter (Signed)
Pt has a 2 hour infusion etoposide scheduled at 3 pm tomorrow. He has a 130 injection appt on Saturday which will not be after the 24 hour minimum limit. Can the infusion appt be moved to earlier? We are closed on Monday so cannot give injection at that time. lvm with Melissa and sent inbasket

## 2016-12-25 NOTE — Telephone Encounter (Signed)
allie pharmacist at [facility] is calling to get OK to fill oxycodone for pt who is also on diltiazem b/c there can be interaction. Authorization given to fill oxycodone per Dr Julien Nordmann.

## 2016-12-25 NOTE — Patient Instructions (Signed)
[facility] Discharge Instructions for Patients Receiving Chemotherapy  Today you received the following chemotherapy agents: Etoposide   To help prevent nausea and vomiting after your treatment, we encourage you to take your nausea medication as directed.    If you develop nausea and vomiting that is not controlled by your nausea medication, call the clinic.   BELOW ARE SYMPTOMS THAT SHOULD BE REPORTED IMMEDIATELY:  *FEVER GREATER THAN 100.5 F  *CHILLS WITH OR WITHOUT FEVER  NAUSEA AND VOMITING THAT IS NOT CONTROLLED WITH YOUR NAUSEA MEDICATION  *UNUSUAL SHORTNESS OF BREATH  *UNUSUAL BRUISING OR BLEEDING  TENDERNESS IN MOUTH AND THROAT WITH OR WITHOUT PRESENCE OF ULCERS  *URINARY PROBLEMS  *BOWEL PROBLEMS  UNUSUAL RASH Items with * indicate a potential emergency and should be followed up as soon as possible.  Feel free to call the clinic you have any questions or concerns. The clinic phone number is [phone].  Please show the [place] at check-in to the Emergency Department and triage nurse.

## 2016-12-25 NOTE — Progress Notes (Signed)
Mr. Italo Banton is here today for 1 month follow up to radiation to lung for small cell lung cancer.  Patient denies having any pain, only reports headaches following chemo.  Patient states his appetite is well.  He reports having received two blood transfusions and prior to those he is very fatigued.  He is receiving chemotherapy (etoposide & carboplatin) and states that he has severe headaches following chemo and more so when his hemoglobin is low.  Patient states he occasionally has a dry cough.  Patient Denies being short of breath as long as hgb is within normal limits.  Patient denies having any issues eating/drinking/swallowing.  Denies hemopytosis.  Denies using biafine and denies any skin issues.  Questions when he can get back out in the sun without a shirt on.  He states he typically does not use sunblock and recommended that he does use that.     Vitals:   12/25/16 0912  BP: 127/79  Pulse: 92  Resp: 20  Temp: 98.1 F (36.7 C)  TempSrc: Oral  SpO2: 100%  Weight: 222 lb 9.6 oz (101 kg)   Wt Readings from Last 3 Encounters:  12/25/16 222 lb 9.6 oz (101 kg)  12/17/16 213 lb (96.6 kg)  11/26/16 221 lb 3.2 oz (100.3 kg)

## 2016-12-25 NOTE — Progress Notes (Signed)
Patient states he has been having headaches that start a few hours after each chemotherapy treatment he receives. Patients states they normally are an 8/10 with mild relief from Tylenol. Dr. Julien Nordmann notified and prescription given for Percocet. Patient aware and verbalized understanding.

## 2016-12-26 ENCOUNTER — Ambulatory Visit (HOSPITAL_BASED_OUTPATIENT_CLINIC_OR_DEPARTMENT_OTHER): Payer: BLUE CROSS/BLUE SHIELD

## 2016-12-26 ENCOUNTER — Encounter: Payer: Self-pay | Admitting: Internal Medicine

## 2016-12-26 ENCOUNTER — Ambulatory Visit (HOSPITAL_BASED_OUTPATIENT_CLINIC_OR_DEPARTMENT_OTHER): Payer: BLUE CROSS/BLUE SHIELD | Admitting: Internal Medicine

## 2016-12-26 ENCOUNTER — Telehealth: Payer: Self-pay

## 2016-12-26 VITALS — BP 141/82 | HR 89 | Temp 97.8°F | Resp 19 | Ht 74.0 in | Wt 223.8 lb

## 2016-12-26 DIAGNOSIS — R51 Headache: Secondary | ICD-10-CM | POA: Diagnosis not present

## 2016-12-26 DIAGNOSIS — J449 Chronic obstructive pulmonary disease, unspecified: Secondary | ICD-10-CM

## 2016-12-26 DIAGNOSIS — C3492 Malignant neoplasm of unspecified part of left bronchus or lung: Secondary | ICD-10-CM

## 2016-12-26 DIAGNOSIS — C3412 Malignant neoplasm of upper lobe, left bronchus or lung: Secondary | ICD-10-CM

## 2016-12-26 DIAGNOSIS — D61810 Antineoplastic chemotherapy induced pancytopenia: Secondary | ICD-10-CM | POA: Diagnosis not present

## 2016-12-26 DIAGNOSIS — Z5111 Encounter for antineoplastic chemotherapy: Secondary | ICD-10-CM

## 2016-12-26 DIAGNOSIS — D649 Anemia, unspecified: Secondary | ICD-10-CM

## 2016-12-26 MED ORDER — SODIUM CHLORIDE 0.9% FLUSH
10.0000 mL | INTRAVENOUS | Status: DC | PRN
  Administered 2016-12-26: 10 mL
  Filled 2016-12-26: qty 10

## 2016-12-26 MED ORDER — ETOPOSIDE CHEMO INJECTION 1 GM/50ML
100.0000 mg/m2 | Freq: Once | INTRAVENOUS | Status: AC
  Administered 2016-12-26: 230 mg via INTRAVENOUS
  Filled 2016-12-26: qty 11.5

## 2016-12-26 MED ORDER — SODIUM CHLORIDE 0.9 % IV SOLN
Freq: Once | INTRAVENOUS | Status: AC
  Administered 2016-12-26: 13:00:00 via INTRAVENOUS

## 2016-12-26 MED ORDER — HEPARIN SOD (PORK) LOCK FLUSH 100 UNIT/ML IV SOLN
500.0000 [IU] | Freq: Once | INTRAVENOUS | Status: AC | PRN
Start: 2016-12-26 — End: 2016-12-26
  Administered 2016-12-26: 500 [IU]
  Filled 2016-12-26: qty 5

## 2016-12-26 MED ORDER — DEXAMETHASONE SODIUM PHOSPHATE 10 MG/ML IJ SOLN
10.0000 mg | Freq: Once | INTRAMUSCULAR | Status: AC
  Administered 2016-12-26: 10 mg via INTRAVENOUS

## 2016-12-26 MED ORDER — DEXAMETHASONE SODIUM PHOSPHATE 10 MG/ML IJ SOLN
INTRAMUSCULAR | Status: AC
  Filled 2016-12-26: qty 1

## 2016-12-26 NOTE — Progress Notes (Signed)
Area @ port where emla cream was applied remained red through infusion. Pt reported no discomfort, pain, or irritation, observed no swelling, and no pain on palpation.  Informed pt to watch area and notify [facility] if redness spread or area became irritated. Pt to report to [facility] for neulasta shot 12/27/2016 @ 1430, pt informed to have RN tomorrow look at site. Pt and wife verbalized understanding.

## 2016-12-26 NOTE — Progress Notes (Signed)
[name] [phone]   [phone]  OFFICE PROGRESS NOTE  [name], MD [address] [place] [address]  DIAGNOSIS: Extensive stage (T4, N2, M1b) small cell lung cancer presented with large left upper lobe lung mass with mediastinal invasion as well as pancreatic lesion and suspicious left iliac bone metastasis diagnosed in February 2018.  PRIOR THERAPY: None  CURRENT THERAPY: Systemic chemotherapy with carboplatin for AUC of 5 on day 1 and etoposide 100 MG/M2 on days 1, 2 and 3 with Neulasta support status post 4 cycles. This is concurrent with radiotherapy. This also concurrent with radiation to the large lung mass.  INTERVAL HISTORY: Jesse Avila 66 y.o. male returns to the clinic today for walk-in visit accompanied by his wife and daughter. The patient was complaining of headache yesterday and he was started and Percocet. He has lack of sleep last night with choking sensation and cough with sputum production but he was unable to clear his sputum. After sleepless night, he woke up this morning and was a little bit confused. His wife was wide and ask for the patient to be seen. When he came to the clinic today who is feeling much better. His oxygen saturation was 95% on room air. He continues to have shortness breath with exertion and mild cough. He has no current nausea or vomiting, no fever or chills. He is here today to proceed with day #3 of cycle #5 of his treatment.    MEDICAL HISTORY: Past Medical History:  Diagnosis Date  . Carotid artery occlusion   . DJD (degenerative joint disease) of knee   . Essential hypertension, benign    takes Benicar daily  . Goals of care, counseling/discussion 09/18/2016  . History of colon polyps    benign  . History of gout   . History of radiation therapy 09/30/16-11/10/16   left lung 60 Gy in 30 fractions  . Hyperlipidemia    takes Fenofibrate and Crestor daily  . Insomnia      takes Ambien nightly as needed  . Joint pain   . Lung cancer ([place])   . Nocturia   . Peripheral vascular disease ([place])   . Primary localized osteoarthritis of left knee 12/26/2014  . Stroke [facility]) 2015?   takes Plavix daily as well as Pletal,not on plavix at present 05/15/15  . 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  MEDICATIONS:  Current Outpatient Prescriptions  Medication Sig Dispense Refill  . apixaban (ELIQUIS) 5 MG TABS tablet Take 1 tablet (5 mg total) by mouth 2 (two) times daily. 60 tablet 3  . Blood Glucose Monitoring Suppl (ACCU-CHEK NANO SMARTVIEW) w/Device KIT by Does not apply route.    . diltiazem (CARDIZEM CD) 120 MG 24 hr capsule Take 1 capsule (120 mg total) by mouth daily. 30 capsule 2  . emollient (BIAFINE) cream Apply topically 2 (two) times daily.    . fenofibrate 160 MG tablet Take 160 mg by mouth daily.    Marland Kitchen glimepiride (AMARYL) 4 MG tablet Take 4 mg by mouth daily with breakfast.    . lidocaine-prilocaine (EMLA) cream Apply 1 application topically as needed. 30 g 0  . metFORMIN (GLUCOPHAGE) 500 MG tablet Take 1 tablet (500 mg total) by mouth 3 (three) times daily.    Marland Kitchen  metoprolol succinate (TOPROL-XL) 50 MG 24 hr tablet Take 50 mg by mouth daily. Take with or immediately following a meal.    . oxyCODONE-acetaminophen (PERCOCET/ROXICET) 5-325 MG tablet Take 1 tablet by mouth every 6 (six) hours as needed for severe pain. 30 tablet 0  . pioglitazone (ACTOS) 15 MG tablet Take 15 mg by mouth daily.    . rosuvastatin (CRESTOR) 40 MG tablet Take 40 mg by mouth every evening. For cholesterol    . zolpidem (AMBIEN) 10 MG tablet Take 10 mg by mouth at bedtime as needed for sleep.     Marland Kitchen prochlorperazine (COMPAZINE) 10 MG tablet Take 1 tablet (10 mg total) by mouth every 6 (six) hours as needed for nausea or  vomiting. (Patient not taking: Reported on 11/26/2016) 30 tablet 0   No current facility-administered medications for this visit.    Facility-Administered Medications Ordered in Other Visits  Medication Dose Route Frequency Provider Last Rate Last Dose  . heparin lock flush 100 unit/mL  500 Units Intracatheter Once PRN Curt Bears, MD      . sodium chloride flush (NS) 0.9 % injection 10 mL  10 mL Intracatheter PRN Curt Bears, MD   10 mL at 10/13/16 1618  . sodium chloride flush (NS) 0.9 % injection 10 mL  10 mL Intracatheter PRN Curt Bears, MD      . sodium chloride flush (NS) 0.9 % injection 10 mL  10 mL Intracatheter PRN Curt Bears, MD   10 mL at 11/26/16 1544    SURGICAL HISTORY:  Past Surgical History:  Procedure Laterality Date  . ABDOMINAL AORTAGRAM N/A 03/29/2012   Procedure: ABDOMINAL Maxcine Ham;  Surgeon: Angelia Mould, MD;  Location: [facility] CATH LAB;  Service: Cardiovascular;  Laterality: N/A;  . COLONOSCOPY    . ENDARTERECTOMY Right 05/09/2014   Procedure: RIGHT CAROTID ENDARTERECTOMY WITH PATCH ANGIOPLASTY;  Surgeon: Conrad Swede Heaven, MD;  Location: [facility];  Service: Vascular;  Laterality: Right;  . ENDARTERECTOMY FEMORAL Right 05/17/2015   Procedure: ENDARTERECTOMY FEMORAL;  Surgeon: Angelia Mould, MD;  Location: [facility];  Service: Vascular;  Laterality: Right;  . ENDOBRONCHIAL ULTRASOUND Bilateral 09/08/2016   Procedure: ENDOBRONCHIAL ULTRASOUND;  Surgeon: Rigoberto Noel, MD;  Location: [facility] ENDOSCOPY;  Service: Cardiopulmonary;  Laterality: Bilateral;  . FEMORAL-POPLITEAL BYPASS GRAFT Right 05/17/2015   Procedure: BYPASS GRAFT FEMORAL-BELOW KNEE POPLITEAL ARTERY, using gortex propaten graft 6 mm x 80 cm;  Surgeon: Angelia Mould, MD;  Location: [facility];  Service: Vascular;  Laterality: Right;  . IR GENERIC HISTORICAL  09/25/2016   IR FLUORO GUIDE PORT INSERTION RIGHT 09/25/2016 Markus Daft, MD [facility]-INTERV RAD  . IR GENERIC HISTORICAL  09/25/2016   IR US  GUIDE VASC ACCESS RIGHT 09/25/2016 Markus Daft, MD [facility]-INTERV RAD  . KNEE ARTHROSCOPY Right 11/2009  . LOWER EXTREMITY ANGIOGRAM Bilateral 03/23/2015   Procedure: Lower Extremity Angiogram;  Surgeon: Angelia Mould, MD;  Location: [facility] CV LAB;  Service: Cardiovascular;  Laterality: Bilateral;  . MENISCUS REPAIR  11/2009  . PARTIAL KNEE ARTHROPLASTY Left 12/26/2014   Procedure: UNICOMPARTMENTAL KNEE;  Surgeon: Marchia Bond, MD;  Location: [place];  Service: Orthopedics;  Laterality: Left;  . PATCH ANGIOPLASTY Right 05/17/2015   Procedure: VEIN PATCH ANGIOPLASTY ILEOFEMORAL ARTERY;  Surgeon: Angelia Mould, MD;  Location: [facility];  Service: Vascular;  Laterality: Right;  . PERCUTANEOUS STENT INTERVENTION N/A 05/10/2012   Procedure: PERCUTANEOUS STENT INTERVENTION;  Surgeon: Angelia Mould, MD;  Location: [facility] CATH LAB;  Service: Cardiovascular;  Laterality: N/A;  . PERIPHERAL VASCULAR CATHETERIZATION N/A 03/23/2015   Procedure: Abdominal Aortogram;  Surgeon: Angelia Mould, MD;  Location: [place] CV LAB;  Service: Cardiovascular;  Laterality: N/A;  . STENTS     PLACED IN ??BOTH LEGS   2013?    REVIEW OF SYSTEMS:  Constitutional: positive for fatigue Eyes: negative Ears, nose, mouth, throat, and face: negative Respiratory: positive for cough, dyspnea on exertion and sputum Cardiovascular: negative Gastrointestinal: negative Genitourinary:negative Integument/breast: negative Hematologic/lymphatic: negative Musculoskeletal:negative Neurological: positive for headaches Behavioral/Psych: negative Endocrine: negative Allergic/Immunologic: negative   PHYSICAL EXAMINATION: General appearance: alert, cooperative, fatigued and no distress Head: Normocephalic, without obvious abnormality, atraumatic Neck: no adenopathy, no JVD, supple, symmetrical, trachea midline and thyroid not enlarged, symmetric, no tenderness/mass/nodules Lymph nodes: Cervical, supraclavicular, and  axillary nodes normal. Resp: clear to auscultation bilaterally Back: symmetric, no curvature. ROM normal. No CVA tenderness. Cardio: regular rate and rhythm, S1, S2 normal, no murmur, click, rub or gallop GI: soft, non-tender; bowel sounds normal; no masses,  no organomegaly Extremities: extremities normal, atraumatic, no cyanosis or edema Neurologic: Alert and oriented X 3, normal strength and tone. Normal symmetric reflexes. Normal coordination and gait  ECOG PERFORMANCE STATUS: 1 - Symptomatic but completely ambulatory  Blood pressure (!) 141/82, pulse 89, temperature 97.8 F (36.6 C), temperature source Oral, resp. rate 19, height '6\' 2"'$  (1.88 m), weight 223 lb 12.8 oz (101.5 kg), SpO2 95 %.  LABORATORY DATA: Lab Results  Component Value Date   WBC 4.1 12/24/2016   HGB 8.6 (L) 12/24/2016   HCT 25.9 (L) 12/24/2016   MCV 95.1 12/24/2016   PLT 164 12/24/2016      Chemistry      Component Value Date/Time   NA 141 12/24/2016 1045   K 4.2 12/24/2016 1045   CL 101 10/22/2016 0914   CO2 23 12/24/2016 1045   BUN 12.6 12/24/2016 1045   CREATININE 1.3 12/24/2016 1045      Component Value Date/Time   CALCIUM 9.4 12/24/2016 1045   ALKPHOS 55 12/24/2016 1045   AST 15 12/24/2016 1045   ALT 14 12/24/2016 1045   BILITOT 0.69 12/24/2016 1045       RADIOGRAPHIC STUDIES: Ct Chest W Contrast  Result Date: 12/16/2016 CLINICAL DATA:  Small cell lung cancer diagnosed in 2018 with ongoing chemotherapy and completed radiotherapy. EXAM: CT CHEST, ABDOMEN, AND PELVIS WITH CONTRAST TECHNIQUE: Multidetector CT imaging of the chest, abdomen and pelvis was performed following the standard protocol during bolus administration of intravenous contrast. CONTRAST:  100 cc Isovue-300 COMPARISON:  Multiple exams, including 11/04/2016 FINDINGS: CT CHEST FINDINGS Cardiovascular: Coronary, aortic arch, and branch vessel atherosclerotic vascular disease. Mediastinum/Nodes: Left mediastinal and suprahilar mass  3.8 by 5.5 cm on image 20/2, previously 4.4 by 6.7 cm when measured in the same fashion on 11/04/2016. This extends into the AP window. Lungs/Pleura: Calcified bilateral pleural plaques. Confluent with the AP window and suprahilar mass there is abnormal band of density in the left upper lobe with some associated volume loss, potentially representing a combination of tumor, atelectasis, and radiation pneumonitis. At the level of the aortic arch this band measures 2.3 cm in thickness, previously 2.6 cm. Continued central narrowing of the left upper lobe tracheobronchial tree. Continued interstitial accentuation in the left upper lobe. Paraseptal emphysema. Musculoskeletal: Lower thoracic spondylosis. CT ABDOMEN PELVIS FINDINGS Hepatobiliary: Prominent left hepatic lobe and caudate lobe raise the possibility of early cirrhosis based on morphology. Contracted gallbladder, as before. No biliary dilatation. Pancreas: Unremarkable Spleen:  Unremarkable Adrenals/Urinary Tract: Unremarkable Stomach/Bowel: Sigmoid colon diverticulosis. Vascular/Lymphatic: Aortoiliac atherosclerotic vascular disease. There is some focal ectasia of the right common femoral artery with a chronically thrombosed superficial graft noted. No pathologic adenopathy. Reproductive: Unremarkable Other: Mild fatty prominence along the left omentum, causing bowel to appear somewhat displaced to the right. Musculoskeletal: Lower lumbar spondylosis and degenerative disc disease. Small umbilical hernia contains adipose tissue. Stable faint sclerosis in the left iliac bone, uncertain significance, query treated osseous metastatic lesion. IMPRESSION: 1. Further reduction in size of the left suprahilar mass with involvement of the AP window, currently measuring 3.8 by 5.5. No new metastatic lesion is identified. Continued narrowing of the left upper lobe central tracheobronchial tree in the vicinity of the mass. 2. Hepatic morphology reason the possibility of  early cirrhosis. 3. Aortic Atherosclerosis (ICD10-I70.0) and Emphysema (ICD10-J43.9). Coronary atherosclerosis. 4. Faint sclerosis in the left iliac bone is stable, query previously treated metastatic lesion. 5. Other imaging findings of potential clinical significance: Sigmoid diverticulosis. 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 Fatty prominence the left omentum without a discrete mass identified. Lower lumbar spondylosis and degenerative disc disease. Calcified bilateral pleural plaques. Electronically Signed   By: Van Clines M.D.   On: 12/16/2016 13:42   Ct Abdomen Pelvis W Contrast  Result Date: 12/16/2016 CLINICAL DATA:  Small cell lung cancer diagnosed in 2018 with ongoing chemotherapy and completed radiotherapy. EXAM: CT CHEST, ABDOMEN, AND PELVIS WITH CONTRAST TECHNIQUE: Multidetector CT imaging of the chest, abdomen and pelvis was performed following the standard protocol during bolus administration of intravenous contrast. CONTRAST:  100 cc Isovue-300 COMPARISON:  Multiple exams, including 11/04/2016 FINDINGS: CT CHEST FINDINGS Cardiovascular: Coronary, aortic arch, and branch vessel atherosclerotic vascular disease. Mediastinum/Nodes: Left mediastinal and suprahilar mass 3.8 by 5.5 cm on image 20/2, previously 4.4 by 6.7 cm when measured in the same fashion on 11/04/2016. This extends into the AP window. Lungs/Pleura: Calcified bilateral pleural plaques. Confluent with the AP window and suprahilar mass there is abnormal band of density in the left upper lobe with some associated volume loss, potentially representing a combination of tumor, atelectasis, and radiation pneumonitis. At the level of the aortic arch this band measures 2.3 cm in thickness, previously 2.6 cm. Continued central narrowing of the left upper lobe tracheobronchial tree. Continued interstitial accentuation in the left upper lobe. Paraseptal emphysema. Musculoskeletal: Lower thoracic  spondylosis. CT ABDOMEN PELVIS FINDINGS Hepatobiliary: Prominent left hepatic lobe and caudate lobe raise the possibility of early cirrhosis based on morphology. Contracted gallbladder, as before. No biliary dilatation. Pancreas: Unremarkable Spleen: Unremarkable Adrenals/Urinary Tract: Unremarkable Stomach/Bowel: Sigmoid colon diverticulosis. Vascular/Lymphatic: Aortoiliac atherosclerotic vascular disease. There is some focal ectasia of the right common femoral artery with a chronically thrombosed superficial graft noted. No pathologic adenopathy. Reproductive: Unremarkable Other: Mild fatty prominence along the left omentum, causing bowel to appear somewhat displaced to the right. Musculoskeletal: Lower lumbar spondylosis and degenerative disc disease. Small umbilical hernia contains adipose tissue. Stable faint sclerosis in the left iliac bone, uncertain significance, query treated osseous metastatic lesion. IMPRESSION: 1. Further reduction in size of the left suprahilar mass with involvement of the AP window, currently measuring 3.8 by 5.5. No new metastatic lesion is identified. Continued narrowing of the left upper lobe central tracheobronchial tree in the vicinity of the mass. 2. Hepatic morphology reason the possibility of early cirrhosis. 3. Aortic Atherosclerosis (ICD10-I70.0) and Emphysema (ICD10-J43.9). Coronary atherosclerosis. 4. Faint sclerosis in the left iliac bone is stable, query previously treated metastatic lesion. 5.  Other imaging findings of potential clinical significance: Sigmoid diverticulosis. 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 Fatty prominence the left omentum without a discrete mass identified. Lower lumbar spondylosis and degenerative disc disease. Calcified bilateral pleural plaques. Electronically Signed   By: Van Clines M.D.   On: 12/16/2016 13:42    ASSESSMENT AND PLAN:  This is a very pleasant 66 years old white male with extensive stage  small cell lung cancer. He is currently undergoing systemic chemotherapy was carboplatin and etoposide status post 4 cycles and he is currently undergoing cycle #5. His treatment was concurrent with radiotherapy to the chest. He was tolerated the treatment well except for pancytopenia. He required 2 units of PRBCs transfusion last week. The patient continues to complain of increasing fatigue as well as shortness of breath and productive cough. I recommended for him to start using Mucinex for expectoration. For the headache, he will continue on Percocet for now and if he continues to have persistent headache I will order repeat MRI of the brain to rule out brain metastasis. The patient will proceed with his systemic chemotherapy today as a scheduled. I will see him back for follow-up visit in 3 weeks for reevaluation before starting cycle #6. He was advised to call immediately if he has any concerning symptoms in the interval. The patient voices understanding of current disease status and treatment options and is in agreement with the current care plan. All questions were answered. The patient knows to call the clinic with any problems, questions or concerns. We can certainly see the patient much sooner if necessary. I spent 15 minutes counseling the patient face to face. The total time spent in the appointment was 25 minutes. Disclaimer: This note was dictated with voice recognition software. Similar sounding words can inadvertently be transcribed and may not be corrected upon review.

## 2016-12-26 NOTE — Telephone Encounter (Signed)
A lot of wheezing in chest, on couch, a hard time breathing, a lot of phlegm with cough, "like something stuck in his throat". A tad pink. Feels warm. Getting a little confused, getting dressed to come for chemo forgetting his appt is at 3 pm. Told him 3 times this AM.   S/w Dr Julien Nordmann and instructed wife that Dr Julien Nordmann will see them about 1030 today. Last labs 5/23. inbasket sent to schedule.

## 2016-12-27 ENCOUNTER — Ambulatory Visit (HOSPITAL_BASED_OUTPATIENT_CLINIC_OR_DEPARTMENT_OTHER): Payer: BLUE CROSS/BLUE SHIELD

## 2016-12-27 VITALS — BP 139/67 | HR 83 | Temp 98.6°F | Resp 17

## 2016-12-27 DIAGNOSIS — C3412 Malignant neoplasm of upper lobe, left bronchus or lung: Secondary | ICD-10-CM

## 2016-12-27 DIAGNOSIS — Z5189 Encounter for other specified aftercare: Secondary | ICD-10-CM

## 2016-12-27 DIAGNOSIS — C3492 Malignant neoplasm of unspecified part of left bronchus or lung: Secondary | ICD-10-CM

## 2016-12-27 MED ORDER — PEGFILGRASTIM INJECTION 6 MG/0.6ML ~~LOC~~
6.0000 mg | PREFILLED_SYRINGE | Freq: Once | SUBCUTANEOUS | Status: AC
  Administered 2016-12-27: 6 mg via SUBCUTANEOUS

## 2016-12-27 NOTE — Progress Notes (Signed)
Patient came in today for his scheduled neulasta injection and asked me to assess the skin where is port is, right side of chest. Patient stated yesterday skin over port looked red. Today patient's skin looks normal, no redness noted and patient confirms no itching or pain to site. Suggested to patient at next treatment to request opsite dressing to see if that helps. Patient voiced understanding. All questions answered, knows to call clinic with any questions or concerns.

## 2016-12-31 ENCOUNTER — Telehealth: Payer: Self-pay | Admitting: Internal Medicine

## 2016-12-31 NOTE — Telephone Encounter (Signed)
Per 5/25 - NO LOS

## 2017-01-06 ENCOUNTER — Telehealth: Payer: Self-pay | Admitting: Medical Oncology

## 2017-01-06 NOTE — Telephone Encounter (Addendum)
Pt not due for chemo until June 13. Appts cancelled for tomorrow. I have tried to call pt x 3 over 2 hours but no answer.

## 2017-01-07 ENCOUNTER — Encounter: Payer: Self-pay | Admitting: *Deleted

## 2017-01-07 ENCOUNTER — Ambulatory Visit: Payer: BLUE CROSS/BLUE SHIELD

## 2017-01-07 ENCOUNTER — Ambulatory Visit: Payer: BLUE CROSS/BLUE SHIELD | Admitting: Internal Medicine

## 2017-01-07 ENCOUNTER — Other Ambulatory Visit: Payer: BLUE CROSS/BLUE SHIELD

## 2017-01-08 ENCOUNTER — Ambulatory Visit: Payer: BLUE CROSS/BLUE SHIELD

## 2017-01-09 ENCOUNTER — Ambulatory Visit: Payer: BLUE CROSS/BLUE SHIELD

## 2017-01-12 ENCOUNTER — Ambulatory Visit: Payer: BLUE CROSS/BLUE SHIELD

## 2017-01-14 ENCOUNTER — Ambulatory Visit: Payer: BLUE CROSS/BLUE SHIELD

## 2017-01-14 ENCOUNTER — Other Ambulatory Visit: Payer: Self-pay | Admitting: *Deleted

## 2017-01-14 ENCOUNTER — Other Ambulatory Visit: Payer: BLUE CROSS/BLUE SHIELD

## 2017-01-14 ENCOUNTER — Other Ambulatory Visit (HOSPITAL_BASED_OUTPATIENT_CLINIC_OR_DEPARTMENT_OTHER): Payer: BLUE CROSS/BLUE SHIELD

## 2017-01-14 ENCOUNTER — Ambulatory Visit (HOSPITAL_BASED_OUTPATIENT_CLINIC_OR_DEPARTMENT_OTHER): Payer: BLUE CROSS/BLUE SHIELD

## 2017-01-14 ENCOUNTER — Ambulatory Visit (HOSPITAL_COMMUNITY)
Admission: RE | Admit: 2017-01-14 | Discharge: 2017-01-14 | Disposition: A | Discharge status: Home / Self Care | Payer: BLUE CROSS/BLUE SHIELD | Source: Ambulatory Visit | Attending: Internal Medicine | Admitting: Internal Medicine

## 2017-01-14 ENCOUNTER — Ambulatory Visit (HOSPITAL_BASED_OUTPATIENT_CLINIC_OR_DEPARTMENT_OTHER): Payer: BLUE CROSS/BLUE SHIELD | Admitting: Nurse Practitioner

## 2017-01-14 VITALS — BP 120/65 | HR 86 | Resp 18 | Ht 74.0 in | Wt 222.5 lb

## 2017-01-14 DIAGNOSIS — C3492 Malignant neoplasm of unspecified part of left bronchus or lung: Secondary | ICD-10-CM

## 2017-01-14 DIAGNOSIS — Z7189 Other specified counseling: Secondary | ICD-10-CM

## 2017-01-14 DIAGNOSIS — C3412 Malignant neoplasm of upper lobe, left bronchus or lung: Secondary | ICD-10-CM

## 2017-01-14 DIAGNOSIS — Z5111 Encounter for antineoplastic chemotherapy: Secondary | ICD-10-CM | POA: Diagnosis not present

## 2017-01-14 DIAGNOSIS — D696 Thrombocytopenia, unspecified: Secondary | ICD-10-CM

## 2017-01-14 DIAGNOSIS — C3402 Malignant neoplasm of left main bronchus: Secondary | ICD-10-CM

## 2017-01-14 DIAGNOSIS — C3490 Malignant neoplasm of unspecified part of unspecified bronchus or lung: Secondary | ICD-10-CM

## 2017-01-14 DIAGNOSIS — Z95828 Presence of other vascular implants and grafts: Secondary | ICD-10-CM

## 2017-01-14 DIAGNOSIS — D649 Anemia, unspecified: Secondary | ICD-10-CM | POA: Diagnosis not present

## 2017-01-14 DIAGNOSIS — I10 Essential (primary) hypertension: Secondary | ICD-10-CM

## 2017-01-14 DIAGNOSIS — I739 Peripheral vascular disease, unspecified: Secondary | ICD-10-CM

## 2017-01-14 LAB — CBC WITH DIFFERENTIAL/PLATELET
BASO%: 0.2 % (ref 0.0–2.0)
BASOS ABS: 0 10*3/uL (ref 0.0–0.1)
EOS ABS: 0 10*3/uL (ref 0.0–0.5)
EOS%: 0.9 % (ref 0.0–7.0)
HCT: 21.2 % — ABNORMAL LOW (ref 38.4–49.9)
HEMOGLOBIN: 6.7 g/dL — AB (ref 13.0–17.1)
LYMPH#: 0.9 10*3/uL (ref 0.9–3.3)
LYMPH%: 20.2 % (ref 14.0–49.0)
MCH: 32.2 pg (ref 27.2–33.4)
MCHC: 31.6 g/dL — ABNORMAL LOW (ref 32.0–36.0)
MCV: 101.9 fL — AB (ref 79.3–98.0)
MONO#: 0.5 10*3/uL (ref 0.1–0.9)
MONO%: 10 % (ref 0.0–14.0)
NEUT#: 3.2 10*3/uL (ref 1.5–6.5)
NEUT%: 68.7 % (ref 39.0–75.0)
PLATELETS: 100 10*3/uL — AB (ref 140–400)
RBC: 2.08 10*6/uL — ABNORMAL LOW (ref 4.20–5.82)
RDW: 23.3 % — ABNORMAL HIGH (ref 11.0–14.6)
WBC: 4.6 10*3/uL (ref 4.0–10.3)
nRBC: 1 % — ABNORMAL HIGH (ref 0–0)

## 2017-01-14 LAB — COMPREHENSIVE METABOLIC PANEL
ALBUMIN: 3.5 g/dL (ref 3.5–5.0)
ALK PHOS: 53 U/L (ref 40–150)
ALT: 12 U/L (ref 0–55)
AST: 13 U/L (ref 5–34)
Anion Gap: 9 mEq/L (ref 3–11)
BILIRUBIN TOTAL: 0.49 mg/dL (ref 0.20–1.20)
BUN: 12.9 mg/dL (ref 7.0–26.0)
CO2: 23 mEq/L (ref 22–29)
Calcium: 9.5 mg/dL (ref 8.4–10.4)
Chloride: 109 mEq/L (ref 98–109)
Creatinine: 1 mg/dL (ref 0.7–1.3)
EGFR: 76 mL/min/{1.73_m2} — AB (ref >90)
Glucose: 151 mg/dl — ABNORMAL HIGH (ref 70–140)
POTASSIUM: 3.9 meq/L (ref 3.5–5.1)
Sodium: 141 mEq/L (ref 136–145)
TOTAL PROTEIN: 6.6 g/dL (ref 6.4–8.3)

## 2017-01-14 LAB — PREPARE RBC (CROSSMATCH)

## 2017-01-14 MED ORDER — DEXAMETHASONE SODIUM PHOSPHATE 10 MG/ML IJ SOLN
10.0000 mg | Freq: Once | INTRAMUSCULAR | Status: AC
  Administered 2017-01-14: 10 mg via INTRAVENOUS

## 2017-01-14 MED ORDER — DEXAMETHASONE SODIUM PHOSPHATE 10 MG/ML IJ SOLN
INTRAMUSCULAR | Status: AC
  Filled 2017-01-14: qty 1

## 2017-01-14 MED ORDER — PALONOSETRON HCL INJECTION 0.25 MG/5ML
0.2500 mg | Freq: Once | INTRAVENOUS | Status: AC
  Administered 2017-01-14: 0.25 mg via INTRAVENOUS

## 2017-01-14 MED ORDER — SODIUM CHLORIDE 0.9 % IV SOLN
Freq: Once | INTRAVENOUS | Status: AC
  Administered 2017-01-14: 15:00:00 via INTRAVENOUS

## 2017-01-14 MED ORDER — SODIUM CHLORIDE 0.9% FLUSH
10.0000 mL | INTRAVENOUS | Status: DC | PRN
  Administered 2017-01-14: 10 mL via INTRAVENOUS
  Filled 2017-01-14: qty 10

## 2017-01-14 MED ORDER — PALONOSETRON HCL INJECTION 0.25 MG/5ML
INTRAVENOUS | Status: AC
  Filled 2017-01-14: qty 5

## 2017-01-14 MED ORDER — HEPARIN SOD (PORK) LOCK FLUSH 100 UNIT/ML IV SOLN
500.0000 [IU] | Freq: Once | INTRAVENOUS | Status: AC | PRN
  Administered 2017-01-14: 500 [IU]
  Filled 2017-01-14: qty 5

## 2017-01-14 MED ORDER — SODIUM CHLORIDE 0.9% FLUSH
10.0000 mL | INTRAVENOUS | Status: DC | PRN
  Administered 2017-01-14: 10 mL
  Filled 2017-01-14: qty 10

## 2017-01-14 MED ORDER — CARBOPLATIN CHEMO INJECTION 600 MG/60ML
400.0000 mg | Freq: Once | INTRAVENOUS | Status: AC
  Administered 2017-01-14: 400 mg via INTRAVENOUS
  Filled 2017-01-14: qty 40

## 2017-01-14 MED ORDER — SODIUM CHLORIDE 0.9 % IV SOLN
80.0000 mg/m2 | Freq: Once | INTRAVENOUS | Status: AC
  Administered 2017-01-14: 180 mg via INTRAVENOUS
  Filled 2017-01-14: qty 9

## 2017-01-14 NOTE — Patient Instructions (Signed)
[facility] Discharge Instructions for Patients Receiving Chemotherapy  Today you received the following chemotherapy agents: Etoposide and Carboplatin   To help prevent nausea and vomiting after your treatment, we encourage you to take your nausea medication as directed.    If you develop nausea and vomiting that is not controlled by your nausea medication, call the clinic.   BELOW ARE SYMPTOMS THAT SHOULD BE REPORTED IMMEDIATELY:  *FEVER GREATER THAN 100.5 F  *CHILLS WITH OR WITHOUT FEVER  NAUSEA AND VOMITING THAT IS NOT CONTROLLED WITH YOUR NAUSEA MEDICATION  *UNUSUAL SHORTNESS OF BREATH  *UNUSUAL BRUISING OR BLEEDING  TENDERNESS IN MOUTH AND THROAT WITH OR WITHOUT PRESENCE OF ULCERS  *URINARY PROBLEMS  *BOWEL PROBLEMS  UNUSUAL RASH Items with * indicate a potential emergency and should be followed up as soon as possible.  Feel free to call the clinic you have any questions or concerns. The clinic phone number is [phone].  Please show the [facility] at check-in to the Emergency Department and triage nurse.

## 2017-01-14 NOTE — Patient Instructions (Signed)
Implanted Port Home Guide An implanted port is a type of central line that is placed under the skin. Central lines are used to provide IV access when treatment or nutrition needs to be given through a person's veins. Implanted ports are used for long-term IV access. An implanted port may be placed because:  You need IV medicine that would be irritating to the small veins in your hands or arms.  You need long-term IV medicines, such as antibiotics.  You need IV nutrition for a long period.  You need frequent blood draws for lab tests.  You need dialysis.  Implanted ports are usually placed in the chest area, but they can also be placed in the upper arm, the abdomen, or the leg. An implanted port has two main parts:  Reservoir. The reservoir is round and will appear as a small, raised area under your skin. The reservoir is the part where a needle is inserted to give medicines or draw blood.  Catheter. The catheter is a thin, flexible tube that extends from the reservoir. The catheter is placed into a large vein. Medicine that is inserted into the reservoir goes into the catheter and then into the vein.  How will I care for my incision site? Do not get the incision site wet. Bathe or shower as directed by your health care provider. How is my port accessed? Special steps must be taken to access the port:  Before the port is accessed, a numbing cream can be placed on the skin. This helps numb the skin over the port site.  Your health care provider uses a sterile technique to access the port. ? Your health care provider must put on a mask and sterile gloves. ? The skin over your port is cleaned carefully with an antiseptic and allowed to dry. ? The port is gently pinched between sterile gloves, and a needle is inserted into the port.  Only "non-coring" port needles should be used to access the port. Once the port is accessed, a blood return should be checked. This helps ensure that the port  is in the vein and is not clogged.  If your port needs to remain accessed for a constant infusion, a clear (transparent) bandage will be placed over the needle site. The bandage and needle will need to be changed every week, or as directed by your health care provider.  Keep the bandage covering the needle clean and dry. Do not get it wet. Follow your health care provider's instructions on how to take a shower or bath while the port is accessed.  If your port does not need to stay accessed, no bandage is needed over the port.  What is flushing? Flushing helps keep the port from getting clogged. Follow your health care provider's instructions on how and when to flush the port. Ports are usually flushed with saline solution or a medicine called heparin. The need for flushing will depend on how the port is used.  If the port is used for intermittent medicines or blood draws, the port will need to be flushed: ? After medicines have been given. ? After blood has been drawn. ? As part of routine maintenance.  If a constant infusion is running, the port may not need to be flushed.  How long will my port stay implanted? The port can stay in for as long as your health care provider thinks it is needed. When it is time for the port to come out, surgery will be   done to remove it. The procedure is similar to the one performed when the port was put in. When should I seek immediate medical care? When you have an implanted port, you should seek immediate medical care if:  You notice a bad smell coming from the incision site.  You have swelling, redness, or drainage at the incision site.  You have more swelling or pain at the port site or the surrounding area.  You have a fever that is not controlled with medicine.  This information is not intended to replace advice given to you by your health care provider. Make sure you discuss any questions you have with your health care provider. Document  Released: 07/21/2005 Document Revised: 12/27/2015 Document Reviewed: 03/28/2013 Elsevier Interactive Patient Education  2017 Elsevier Inc.  

## 2017-01-14 NOTE — Progress Notes (Addendum)
[facility] OFFICE PROGRESS NOTE   DIAGNOSIS: Extensive stage (T4, N2, M1b) small cell lung cancer presented with large left upper lobe lung mass with mediastinal invasion as well as pancreatic lesion and suspicious left iliac bone metastasis diagnosed in February 2018.  PRIOR THERAPY: None  CURRENT THERAPY: Systemic chemotherapy with carboplatin for AUC of 5 on day 1 and etoposide 100 MG/M2 on days 1, 2 and 3 with Neulasta support status post 4 cycles. This is concurrent with radiotherapy. This also concurrent with radiation to the large lung mass.  INTERVAL HISTORY:   Mr. Milke returns as scheduled. He completed cycle 5 carboplatin/etoposide beginning 12/24/2016. He denies nausea/vomiting. No mouth sores. No diarrhea or constipation. He denies any bleeding. He reports his energy level is poor. He has mild dyspnea on exertion. He has occasional headaches typically around the time of chemotherapy.  Objective:  Vital signs in last 24 hours:  Blood pressure 120/65, pulse 86, resp. rate 18, height 6\' 2"  (1.88 m), weight 222 lb 8 oz (100.9 kg), SpO2 97 %.    HEENT: No thrush or ulcers. Resp: Lungs clear bilaterally. Cardio: Regular rate and rhythm.  GI: Abdomen soft and nontender. No hepatomegaly. Vascular: Trace bilateral pretibial/ankle edema. Neuro: Alert and oriented.  Skin: No rash. Port-A-Cath without erythema.    Lab Results:  Lab Results  Component Value Date   WBC 4.6 01/14/2017   HGB 6.7 (LL) 01/14/2017   HCT 21.2 (L) 01/14/2017   MCV 101.9 (H) 01/14/2017   PLT 100 (L) 01/14/2017   NEUTROABS 3.2 01/14/2017    Imaging:  No results found.  Medications: I have reviewed the patient's current medications.  Assessment/Plan: 1. Extensive stage small cell lung cancer currently undergoing systemic chemotherapy was carboplatin and etoposide status post 5 cycles. His treatment was concurrent with radiotherapy to the chest. 2. Anemia requiring  intermittent red cell transfusion support.   Disposition: Mr. Kiang appears stable. He has completed 5 cycles of carboplatin/etoposide. Plan to proceed with the sixth and final cycle today as scheduled. The chemotherapy will be dose reduced due to anemia and thrombocytopenia. The plan is for restaging CT scans in approximately 1 month.  We reviewed today's labs. He has progressive anemia. He is symptomatic. We are arranging for a blood transfusion this week. He will begin oral iron twice daily. He also has mild thrombocytopenia. He understands to contact the office with any bleeding. He will return for weekly blood counts. Dose adjustments to the chemotherapy as above.  He will return for a follow-up visit in one month to review the results of the restaging CT scans. He will contact the office in the interim as outlined above or with any other problems.  Patient seen with Dr. Julien Nordmann.    Ned Card ANP/GNP-BC   01/14/2017  2:39 PM  ADDENDUM: Hematology/Oncology Attending: I had a face to face encounter with the patient today. I recommended his care plan. This is a very pleasant 66 years old white male with extensive stage small cell lung cancer. He is currently undergoing systemic chemotherapy with carboplatin and etoposide status post 5 cycles. Has rough time with this treatment and was has increasing fatigue and weakness secondary to chemotherapy-induced anemia. I recommended for the patient to proceed with cycle #6 today but I will reduce the dose of carboplatin to AUC of 3 on day 1 and etoposide 80 MG/M2 on days 1, 2 and 3 with Neulasta support. I will see him back for follow-up visit in one  month's for evaluation with repeat CT scan of the chest, abdomen and pelvis for restaging of his disease. For the chemotherapy-induced anemia, we will arrange for the patient to receive 2 units of PRBCs transfusion this week. He was also advised to take over-the-counter oral iron tablets. The  patient will continue his other home medication. He was advised to call immediately if he has any concerning symptoms in the interval. Disclaimer: This note was dictated with voice recognition software. Similar sounding words can inadvertently be transcribed and may be missed upon review. Eilleen Kempf., MD 01/14/17

## 2017-01-14 NOTE — Progress Notes (Signed)
Per Ned Card NP okay to proceed with treatment with labs ( HBG 6.7, platelets 100) with dose reduction. Pt to receive 2 units PRBCs this week per Ned Card NP.

## 2017-01-15 ENCOUNTER — Ambulatory Visit (HOSPITAL_BASED_OUTPATIENT_CLINIC_OR_DEPARTMENT_OTHER): Payer: BLUE CROSS/BLUE SHIELD

## 2017-01-15 VITALS — BP 121/74 | HR 83 | Temp 98.3°F | Resp 16

## 2017-01-15 DIAGNOSIS — C3492 Malignant neoplasm of unspecified part of left bronchus or lung: Secondary | ICD-10-CM | POA: Diagnosis not present

## 2017-01-15 DIAGNOSIS — C3412 Malignant neoplasm of upper lobe, left bronchus or lung: Secondary | ICD-10-CM

## 2017-01-15 DIAGNOSIS — C3490 Malignant neoplasm of unspecified part of unspecified bronchus or lung: Secondary | ICD-10-CM

## 2017-01-15 DIAGNOSIS — Z5111 Encounter for antineoplastic chemotherapy: Secondary | ICD-10-CM | POA: Diagnosis not present

## 2017-01-15 MED ORDER — DEXAMETHASONE SODIUM PHOSPHATE 10 MG/ML IJ SOLN
10.0000 mg | Freq: Once | INTRAMUSCULAR | Status: AC
  Administered 2017-01-15: 10 mg via INTRAVENOUS

## 2017-01-15 MED ORDER — DEXAMETHASONE SODIUM PHOSPHATE 10 MG/ML IJ SOLN
INTRAMUSCULAR | Status: AC
  Filled 2017-01-15: qty 1

## 2017-01-15 MED ORDER — ETOPOSIDE CHEMO INJECTION 1 GM/50ML
80.0000 mg/m2 | Freq: Once | INTRAVENOUS | Status: AC
  Administered 2017-01-15: 180 mg via INTRAVENOUS
  Filled 2017-01-15: qty 9

## 2017-01-15 MED ORDER — SODIUM CHLORIDE 0.9% FLUSH
10.0000 mL | INTRAVENOUS | Status: DC | PRN
  Filled 2017-01-15: qty 10

## 2017-01-15 MED ORDER — SODIUM CHLORIDE 0.9% FLUSH
10.0000 mL | INTRAVENOUS | Status: AC | PRN
  Administered 2017-01-15: 10 mL
  Filled 2017-01-15: qty 10

## 2017-01-15 MED ORDER — SODIUM CHLORIDE 0.9 % IV SOLN
Freq: Once | INTRAVENOUS | Status: AC
  Administered 2017-01-15: 08:00:00 via INTRAVENOUS

## 2017-01-15 MED ORDER — DIPHENHYDRAMINE HCL 25 MG PO CAPS: 25.0000 mg | ORAL_CAPSULE | Freq: Once | ORAL | Status: DC

## 2017-01-15 MED ORDER — HEPARIN SOD (PORK) LOCK FLUSH 100 UNIT/ML IV SOLN
500.0000 [IU] | Freq: Once | INTRAVENOUS | Status: AC | PRN
  Administered 2017-01-15: 500 [IU]
  Filled 2017-01-15: qty 5

## 2017-01-15 MED ORDER — ACETAMINOPHEN 325 MG PO TABS: 650.0000 mg | ORAL_TABLET | Freq: Once | ORAL | Status: DC

## 2017-01-15 MED ORDER — HEPARIN SOD (PORK) LOCK FLUSH 100 UNIT/ML IV SOLN
500.0000 [IU] | Freq: Every day | INTRAVENOUS | Status: DC | PRN
Start: 2017-01-15 — End: 2017-01-15
  Filled 2017-01-15: qty 5

## 2017-01-15 NOTE — Patient Instructions (Addendum)
[place] Discharge Instructions for Patients Receiving Chemotherapy  Today you received the following chemotherapy agents Etoposide  To help prevent nausea and vomiting after your treatment, we encourage you to take your nausea medication    If you develop nausea and vomiting that is not controlled by your nausea medication, call the clinic.   BELOW ARE SYMPTOMS THAT SHOULD BE REPORTED IMMEDIATELY:  *FEVER GREATER THAN 100.5 F  *CHILLS WITH OR WITHOUT FEVER  NAUSEA AND VOMITING THAT IS NOT CONTROLLED WITH YOUR NAUSEA MEDICATION  *UNUSUAL SHORTNESS OF BREATH  *UNUSUAL BRUISING OR BLEEDING  TENDERNESS IN MOUTH AND THROAT WITH OR WITHOUT PRESENCE OF ULCERS  *URINARY PROBLEMS  *BOWEL PROBLEMS  UNUSUAL RASH Items with * indicate a potential emergency and should be followed up as soon as possible.  Feel free to call the clinic you have any questions or concerns. The clinic phone number is [phone].  Please show the [facility] at check-in to the Emergency Department and triage nurse.    Blood Transfusion A blood transfusion is a procedure in which you are given blood through an IV tube. You may need this procedure because of:  Illness.  Surgery.  Injury.  The blood may come from someone else (a donor). You may also be able to donate blood for yourself (autologous blood donation). The blood given in a transfusion is made up of different types of cells. You may get:  Red blood cells. These carry oxygen to the cells in the body.  White blood cells. These help you fight infections.  Platelets. These help your blood to clot.  Plasma. This is the liquid part of your blood. It helps with fluid imbalances.  If you have a clotting disorder, you may also get other types of blood products. What happens before the procedure?  You will have a blood test to find out your blood type. The test also finds out what type of blood your body will accept and  matches it to the donor type.  If you are going to have a planned surgery, you may be able to donate your own blood. This may be done in case you need a transfusion.  If you have had an allergic reaction to a transfusion in the past, you may be given medicine to help prevent a reaction. This medicine may be given to you by mouth or through an IV.  You will have your temperature, blood pressure, and pulse checked.  Follow instructions from your doctor about what you cannot eat or drink.  Ask your doctor about: ? Changing or stopping your regular medicines. This is important if you take diabetes medicines or blood thinners. ? Taking medicines such as aspirin and ibuprofen. These medicines can thin your blood. Do not take these medicines before your procedure if your doctor tells you not to. What happens during the procedure?  An IV tube will be put into one of your veins.  The bag of donated blood will be attached to your IV tube. Then, the blood will enter through your vein.  Your temperature, blood pressure, and pulse will be checked regularly during the procedure. This is done to find early signs of a transfusion reaction.  If you have any signs or symptoms of a reaction, your transfusion will be stopped. You may also be given medicine.  When the transfusion is done, your IV tube will be taken out.  Pressure may be applied to the IV site for a few  minutes.  A bandage (dressing) will be put on the IV site. The procedure may vary among doctors and hospitals. What happens after the procedure?  Your temperature, blood pressure, heart rate, breathing rate, and blood oxygen level will be checked often.  Your blood may be tested to see how you are responding to the transfusion.  You may be warmed with fluids or blankets. This is done to keep the temperature of your body normal. Summary  A blood transfusion is a procedure in which you are given blood through an IV tube.  The blood  may come from someone else (a donor). You may also be able to donate blood for yourself.  If you have had an allergic reaction to a transfusion in the past, you may be given medicine to help prevent a reaction. This medicine may be given to you by mouth or through an IV tube.  Your temperature, blood pressure, heart rate, breathing rate, and blood oxygen level will be checked often.  Your blood may be tested to see how you are responding to the transfusion. This information is not intended to replace advice given to you by your health care provider. Make sure you discuss any questions you have with your health care provider. Document Released: 10/17/2008 Document Revised: 03/14/2016 Document Reviewed: 03/14/2016 Elsevier Interactive Patient Education  2017 [employer].

## 2017-01-16 ENCOUNTER — Ambulatory Visit (HOSPITAL_BASED_OUTPATIENT_CLINIC_OR_DEPARTMENT_OTHER): Payer: BLUE CROSS/BLUE SHIELD

## 2017-01-16 VITALS — BP 146/81 | HR 94 | Temp 97.6°F | Resp 18

## 2017-01-16 DIAGNOSIS — Z5111 Encounter for antineoplastic chemotherapy: Secondary | ICD-10-CM

## 2017-01-16 DIAGNOSIS — C3492 Malignant neoplasm of unspecified part of left bronchus or lung: Secondary | ICD-10-CM | POA: Diagnosis not present

## 2017-01-16 DIAGNOSIS — C3490 Malignant neoplasm of unspecified part of unspecified bronchus or lung: Secondary | ICD-10-CM | POA: Diagnosis not present

## 2017-01-16 MED ORDER — SODIUM CHLORIDE 0.9% FLUSH
10.0000 mL | INTRAVENOUS | Status: DC | PRN
  Administered 2017-01-16: 10 mL
  Filled 2017-01-16: qty 10

## 2017-01-16 MED ORDER — DIPHENHYDRAMINE HCL 25 MG PO CAPS
ORAL_CAPSULE | ORAL | Status: AC
  Filled 2017-01-16: qty 1

## 2017-01-16 MED ORDER — DEXAMETHASONE SODIUM PHOSPHATE 10 MG/ML IJ SOLN
INTRAMUSCULAR | Status: AC
  Filled 2017-01-16: qty 1

## 2017-01-16 MED ORDER — DIPHENHYDRAMINE HCL 25 MG PO CAPS
25.0000 mg | ORAL_CAPSULE | Freq: Once | ORAL | Status: AC
  Administered 2017-01-16: 25 mg via ORAL

## 2017-01-16 MED ORDER — DEXAMETHASONE SODIUM PHOSPHATE 10 MG/ML IJ SOLN
10.0000 mg | Freq: Once | INTRAMUSCULAR | Status: AC
  Administered 2017-01-16: 10 mg via INTRAVENOUS

## 2017-01-16 MED ORDER — ACETAMINOPHEN 325 MG PO TABS
650.0000 mg | ORAL_TABLET | Freq: Once | ORAL | Status: AC
  Administered 2017-01-16: 650 mg via ORAL

## 2017-01-16 MED ORDER — HEPARIN SOD (PORK) LOCK FLUSH 100 UNIT/ML IV SOLN
500.0000 [IU] | Freq: Once | INTRAVENOUS | Status: AC | PRN
  Administered 2017-01-16: 500 [IU]
  Filled 2017-01-16: qty 5

## 2017-01-16 MED ORDER — ACETAMINOPHEN 325 MG PO TABS
ORAL_TABLET | ORAL | Status: AC
Start: 2017-01-16 — End: 2017-01-16
  Filled 2017-01-16: qty 2

## 2017-01-16 MED ORDER — SODIUM CHLORIDE 0.9 % IV SOLN
Freq: Once | INTRAVENOUS | Status: AC
  Administered 2017-01-16: 10:00:00 via INTRAVENOUS

## 2017-01-16 MED ORDER — ETOPOSIDE CHEMO INJECTION 1 GM/50ML
80.0000 mg/m2 | Freq: Once | INTRAVENOUS | Status: AC
  Administered 2017-01-16: 180 mg via INTRAVENOUS
  Filled 2017-01-16: qty 9

## 2017-01-16 NOTE — Patient Instructions (Addendum)
[place] Discharge Instructions for Patients Receiving Chemotherapy  Today you received the following chemotherapy agents: Etoposide   To help prevent nausea and vomiting after your treatment, we encourage you to take your nausea medication as directed.    If you develop nausea and vomiting that is not controlled by your nausea medication, call the clinic.   BELOW ARE SYMPTOMS THAT SHOULD BE REPORTED IMMEDIATELY:  *FEVER GREATER THAN 100.5 F  *CHILLS WITH OR WITHOUT FEVER  NAUSEA AND VOMITING THAT IS NOT CONTROLLED WITH YOUR NAUSEA MEDICATION  *UNUSUAL SHORTNESS OF BREATH  *UNUSUAL BRUISING OR BLEEDING  TENDERNESS IN MOUTH AND THROAT WITH OR WITHOUT PRESENCE OF ULCERS  *URINARY PROBLEMS  *BOWEL PROBLEMS  UNUSUAL RASH Items with * indicate a potential emergency and should be followed up as soon as possible.  Feel free to call the clinic you have any questions or concerns. The clinic phone number is [phone].  Please show the [facility] at check-in to the Emergency Department and triage nurse.     Blood Transfusion, Adult A blood transfusion is a procedure in which you receive donated blood, including plasma, platelets, and red blood cells, through an IV tube. You may need a blood transfusion because of illness, surgery, or injury. The blood may come from a donor. You may also be able to donate blood for yourself (autologous blood donation) before a surgery if you know that you might require a blood transfusion. The blood given in a transfusion is made up of different types of cells. You may receive:  Red blood cells. These carry oxygen to the cells in the body.  White blood cells. These help you fight infections.  Platelets. These help your blood to clot.  Plasma. This is the liquid part of your blood and it helps with fluid imbalances.  If you have hemophilia or another clotting disorder, you may also receive other types of blood  products. Tell a health care provider about:  Any allergies you have.  All medicines you are taking, including vitamins, herbs, eye drops, creams, and over-the-counter medicines.  Any problems you or family members have had with anesthetic medicines.  Any blood disorders you have.  Any surgeries you have had.  Any medical conditions you have, including any recent fever or cold symptoms.  Whether you are pregnant or may be pregnant.  Any previous reactions you have had during a blood transfusion. What are the risks? Generally, this is a safe procedure. However, problems may occur, including:  Having an allergic reaction to something in the donated blood. Hives and itching may be symptoms of this type of reaction.  Fever. This may be a reaction to the white blood cells in the transfused blood. Nausea or chest pain may accompany a fever.  Iron overload. This can happen from having many transfusions.  Transfusion-related acute lung injury (TRALI). This is a rare reaction that causes lung damage. The cause is not known.TRALI can occur within hours of a transfusion or several days later.  Sudden (acute) or delayed hemolytic reactions. This happens if your blood does not match the cells in your transfusion. Your body's defense system (immune system) may try to attack the new cells. This complication is rare. The symptoms include fever, chills, nausea, and low back pain or chest pain.  Infection or disease transmission. This is rare.  What happens before the procedure?  You will have a blood test to determine your blood type. This is necessary to know  what kind of blood your body will accept and to match it to the donor blood.  If you are going to have a planned surgery, you may be able to do an autologous blood donation. This may be done in case you need to have a transfusion.  If you have had an allergic reaction to a transfusion in the past, you may be given medicine to help  prevent a reaction. This medicine may be given to you by mouth or through an IV tube.  You will have your temperature, blood pressure, and pulse monitored before the transfusion.  Follow instructions from your health care provider about eating and drinking restrictions.  Ask your health care provider about: ? Changing or stopping your regular medicines. This is especially important if you are taking diabetes medicines or blood thinners. ? Taking medicines such as aspirin and ibuprofen. These medicines can thin your blood. Do not take these medicines before your procedure if your health care provider instructs you not to. What happens during the procedure?  An IV tube will be inserted into one of your veins.  The bag of donated blood will be attached to your IV tube. The blood will then enter through your vein.  Your temperature, blood pressure, and pulse will be monitored regularly during the transfusion. This monitoring is done to detect early signs of a transfusion reaction.  If you have any signs or symptoms of a reaction, your transfusion will be stopped and you may be given medicine.  When the transfusion is complete, your IV tube will be removed.  Pressure may be applied to the IV site for a few minutes.  A bandage (dressing) will be applied. The procedure may vary among health care providers and hospitals. What happens after the procedure?  Your temperature, blood pressure, heart rate, breathing rate, and blood oxygen level will be monitored often.  Your blood may be tested to see how you are responding to the transfusion.  You may be warmed with fluids or blankets to maintain a normal body temperature. Summary  A blood transfusion is a procedure in which you receive donated blood, including plasma, platelets, and red blood cells, through an IV tube.  Your temperature, blood pressure, and pulse will be monitored before, during, and after the transfusion.  Your blood may  be tested after the transfusion to see how your body has responded. This information is not intended to replace advice given to you by your health care provider. Make sure you discuss any questions you have with your health care provider. Document Released: 07/18/2000 Document Revised: 04/17/2016 Document Reviewed: 04/17/2016 Elsevier Interactive Patient Education  [employer].

## 2017-01-17 ENCOUNTER — Ambulatory Visit (HOSPITAL_BASED_OUTPATIENT_CLINIC_OR_DEPARTMENT_OTHER): Payer: BLUE CROSS/BLUE SHIELD

## 2017-01-17 VITALS — BP 125/94 | HR 93 | Temp 97.6°F | Resp 18

## 2017-01-17 DIAGNOSIS — C3492 Malignant neoplasm of unspecified part of left bronchus or lung: Secondary | ICD-10-CM

## 2017-01-17 DIAGNOSIS — C3412 Malignant neoplasm of upper lobe, left bronchus or lung: Secondary | ICD-10-CM

## 2017-01-17 DIAGNOSIS — Z5189 Encounter for other specified aftercare: Secondary | ICD-10-CM | POA: Diagnosis not present

## 2017-01-17 MED ORDER — PEGFILGRASTIM INJECTION 6 MG/0.6ML ~~LOC~~
6.0000 mg | PREFILLED_SYRINGE | Freq: Once | SUBCUTANEOUS | Status: AC
  Administered 2017-01-17: 6 mg via SUBCUTANEOUS

## 2017-01-17 NOTE — Patient Instructions (Signed)
Pegfilgrastim injection What is this medicine? PEGFILGRASTIM (PEG fil gra stim) is a long-acting granulocyte colony-stimulating factor that stimulates the growth of neutrophils, a type of white blood cell important in the body's fight against infection. It is used to reduce the incidence of fever and infection in patients with certain types of cancer who are receiving chemotherapy that affects the bone marrow, and to increase survival after being exposed to high doses of radiation. This medicine may be used for other purposes; ask your health care provider or pharmacist if you have questions. COMMON BRAND NAME(S): Neulasta What should I tell my health care provider before I take this medicine? They need to know if you have any of these conditions: -kidney disease -latex allergy -ongoing radiation therapy -sickle cell disease -skin reactions to acrylic adhesives (On-Body Injector only) -an unusual or allergic reaction to pegfilgrastim, filgrastim, other medicines, foods, dyes, or preservatives -pregnant or trying to get pregnant -breast-feeding How should I use this medicine? This medicine is for injection under the skin. If you get this medicine at home, you will be taught how to prepare and give the pre-filled syringe or how to use the On-body Injector. Refer to the patient Instructions for Use for detailed instructions. Use exactly as directed. Tell your healthcare provider immediately if you suspect that the On-body Injector may not have performed as intended or if you suspect the use of the On-body Injector resulted in a missed or partial dose. It is important that you put your used needles and syringes in a special sharps container. Do not put them in a trash can. If you do not have a sharps container, call your pharmacist or healthcare provider to get one. Talk to your pediatrician regarding the use of this medicine in children. While this drug may be prescribed for selected conditions,  precautions do apply. Overdosage: If you think you have taken too much of this medicine contact a poison control center or emergency room at once. NOTE: This medicine is only for you. Do not share this medicine with others. What if I miss a dose? It is important not to miss your dose. Call your doctor or health care professional if you miss your dose. If you miss a dose due to an On-body Injector failure or leakage, a new dose should be administered as soon as possible using a single prefilled syringe for manual use. What may interact with this medicine? Interactions have not been studied. Give your health care provider a list of all the medicines, herbs, non-prescription drugs, or dietary supplements you use. Also tell them if you smoke, drink alcohol, or use illegal drugs. Some items may interact with your medicine. This list may not describe all possible interactions. Give your health care provider a list of all the medicines, herbs, non-prescription drugs, or dietary supplements you use. Also tell them if you smoke, drink alcohol, or use illegal drugs. Some items may interact with your medicine. What should I watch for while using this medicine? You may need blood work done while you are taking this medicine. If you are going to need a MRI, CT scan, or other procedure, tell your doctor that you are using this medicine (On-Body Injector only). What side effects may I notice from receiving this medicine? Side effects that you should report to your doctor or health care professional as soon as possible: -allergic reactions like skin rash, itching or hives, swelling of the face, lips, or tongue -dizziness -fever -pain, redness, or irritation at site   where injected -pinpoint red spots on the skin -red or dark-brown urine -shortness of breath or breathing problems -stomach or side pain, or pain at the shoulder -swelling -tiredness -trouble passing urine or change in the amount of urine Side  effects that usually do not require medical attention (report to your doctor or health care professional if they continue or are bothersome): -bone pain -muscle pain This list may not describe all possible side effects. Call your doctor for medical advice about side effects. You may report side effects to FDA at 1-800-FDA-1088. Where should I keep my medicine? Keep out of the reach of children. Store pre-filled syringes in a refrigerator between 2 and 8 degrees C (36 and 46 degrees F). Do not freeze. Keep in carton to protect from light. Throw away this medicine if it is left out of the refrigerator for more than 48 hours. Throw away any unused medicine after the expiration date. NOTE: This sheet is a summary. It may not cover all possible information. If you have questions about this medicine, talk to your doctor, pharmacist, or health care provider.  2018 Elsevier/Gold Standard (2016-07-17 12:58:03)  

## 2017-01-19 LAB — BPAM RBC
BLOOD PRODUCT EXPIRATION DATE: 201807022359
Blood Product Expiration Date: 201807022359
ISSUE DATE / TIME: 201806140952
ISSUE DATE / TIME: 201806151223
Unit Type and Rh: 5100
Unit Type and Rh: 5100

## 2017-01-19 LAB — TYPE AND SCREEN
ABO/RH(D): O POS
Antibody Screen: NEGATIVE
Unit division: 0
Unit division: 0

## 2017-01-21 ENCOUNTER — Other Ambulatory Visit (HOSPITAL_BASED_OUTPATIENT_CLINIC_OR_DEPARTMENT_OTHER): Payer: BLUE CROSS/BLUE SHIELD

## 2017-01-21 DIAGNOSIS — C3492 Malignant neoplasm of unspecified part of left bronchus or lung: Secondary | ICD-10-CM

## 2017-01-21 DIAGNOSIS — I739 Peripheral vascular disease, unspecified: Secondary | ICD-10-CM

## 2017-01-21 DIAGNOSIS — C3412 Malignant neoplasm of upper lobe, left bronchus or lung: Secondary | ICD-10-CM

## 2017-01-21 DIAGNOSIS — I10 Essential (primary) hypertension: Secondary | ICD-10-CM

## 2017-01-21 DIAGNOSIS — Z7189 Other specified counseling: Secondary | ICD-10-CM

## 2017-01-21 LAB — COMPREHENSIVE METABOLIC PANEL
ALBUMIN: 3.5 g/dL (ref 3.5–5.0)
ALK PHOS: 69 U/L (ref 40–150)
ALT: 28 U/L (ref 0–55)
AST: 17 U/L (ref 5–34)
Anion Gap: 10 mEq/L (ref 3–11)
BUN: 21.7 mg/dL (ref 7.0–26.0)
CHLORIDE: 105 meq/L (ref 98–109)
CO2: 25 mEq/L (ref 22–29)
CREATININE: 0.9 mg/dL (ref 0.7–1.3)
Calcium: 9.5 mg/dL (ref 8.4–10.4)
EGFR: >90 mL/min/{1.73_m2} (ref >90)
GLUCOSE: 115 mg/dL (ref 70–140)
POTASSIUM: 3.6 meq/L (ref 3.5–5.1)
SODIUM: 140 meq/L (ref 136–145)
Total Bilirubin: 0.76 mg/dL (ref 0.20–1.20)
Total Protein: 6.3 g/dL — ABNORMAL LOW (ref 6.4–8.3)

## 2017-01-21 LAB — CBC WITH DIFFERENTIAL/PLATELET
BASO%: 0.5 % (ref 0.0–2.0)
Basophils Absolute: 0 10*3/uL (ref 0.0–0.1)
EOS%: 0.8 % (ref 0.0–7.0)
Eosinophils Absolute: 0 10*3/uL (ref 0.0–0.5)
HCT: 24 % — ABNORMAL LOW (ref 38.4–49.9)
HGB: 8 g/dL — ABNORMAL LOW (ref 13.0–17.1)
LYMPH%: 14.8 % (ref 14.0–49.0)
MCH: 32.3 pg (ref 27.2–33.4)
MCHC: 33.3 g/dL (ref 32.0–36.0)
MCV: 97.2 fL (ref 79.3–98.0)
MONO#: 0.2 10*3/uL (ref 0.1–0.9)
MONO%: 4.1 % (ref 0.0–14.0)
NEUT#: 3.5 10*3/uL (ref 1.5–6.5)
NEUT%: 79.8 % — ABNORMAL HIGH (ref 39.0–75.0)
Platelets: 62 10*3/uL — ABNORMAL LOW (ref 140–400)
RBC: 2.47 10*6/uL — ABNORMAL LOW (ref 4.20–5.82)
RDW: 22.5 % — ABNORMAL HIGH (ref 11.0–14.6)
WBC: 4.4 10*3/uL (ref 4.0–10.3)
lymph#: 0.6 10*3/uL — ABNORMAL LOW (ref 0.9–3.3)

## 2017-01-28 ENCOUNTER — Other Ambulatory Visit (HOSPITAL_BASED_OUTPATIENT_CLINIC_OR_DEPARTMENT_OTHER): Payer: BLUE CROSS/BLUE SHIELD

## 2017-01-28 DIAGNOSIS — I10 Essential (primary) hypertension: Secondary | ICD-10-CM

## 2017-01-28 DIAGNOSIS — C3492 Malignant neoplasm of unspecified part of left bronchus or lung: Secondary | ICD-10-CM

## 2017-01-28 DIAGNOSIS — C3412 Malignant neoplasm of upper lobe, left bronchus or lung: Secondary | ICD-10-CM

## 2017-01-28 DIAGNOSIS — Z7189 Other specified counseling: Secondary | ICD-10-CM

## 2017-01-28 DIAGNOSIS — I739 Peripheral vascular disease, unspecified: Secondary | ICD-10-CM

## 2017-01-28 LAB — CBC WITH DIFFERENTIAL/PLATELET
BASO%: 0.7 % (ref 0.0–2.0)
Basophils Absolute: 0 10*3/uL (ref 0.0–0.1)
EOS ABS: 0 10*3/uL (ref 0.0–0.5)
EOS%: 0.8 % (ref 0.0–7.0)
HCT: 22.3 % — ABNORMAL LOW (ref 38.4–49.9)
HGB: 7.3 g/dL — ABNORMAL LOW (ref 13.0–17.1)
LYMPH%: 14.8 % (ref 14.0–49.0)
MCH: 32.7 pg (ref 27.2–33.4)
MCHC: 32.8 g/dL (ref 32.0–36.0)
MCV: 99.7 fL — AB (ref 79.3–98.0)
MONO#: 0.5 10*3/uL (ref 0.1–0.9)
MONO%: 9.6 % (ref 0.0–14.0)
NEUT#: 3.9 10*3/uL (ref 1.5–6.5)
NEUT%: 74.1 % (ref 39.0–75.0)
PLATELETS: 70 10*3/uL — AB (ref 140–400)
RBC: 2.23 10*6/uL — AB (ref 4.20–5.82)
RDW: 23.4 % — ABNORMAL HIGH (ref 11.0–14.6)
WBC: 5.3 10*3/uL (ref 4.0–10.3)
lymph#: 0.8 10*3/uL — ABNORMAL LOW (ref 0.9–3.3)

## 2017-01-28 LAB — COMPREHENSIVE METABOLIC PANEL
ALT: 28 U/L (ref 0–55)
ANION GAP: 10 meq/L (ref 3–11)
AST: 18 U/L (ref 5–34)
Albumin: 3.4 g/dL — ABNORMAL LOW (ref 3.5–5.0)
Alkaline Phosphatase: 60 U/L (ref 40–150)
BILIRUBIN TOTAL: 0.52 mg/dL (ref 0.20–1.20)
BUN: 10.3 mg/dL (ref 7.0–26.0)
CHLORIDE: 108 meq/L (ref 98–109)
CO2: 22 meq/L (ref 22–29)
Calcium: 9.3 mg/dL (ref 8.4–10.4)
Creatinine: 1.1 mg/dL (ref 0.7–1.3)
EGFR: 75 mL/min/{1.73_m2} — AB (ref >90)
Glucose: 116 mg/dl (ref 70–140)
POTASSIUM: 3.7 meq/L (ref 3.5–5.1)
SODIUM: 140 meq/L (ref 136–145)
Total Protein: 6.4 g/dL (ref 6.4–8.3)

## 2017-02-02 ENCOUNTER — Emergency Department (HOSPITAL_COMMUNITY): Payer: BLUE CROSS/BLUE SHIELD

## 2017-02-02 ENCOUNTER — Encounter (HOSPITAL_COMMUNITY): Payer: Self-pay | Admitting: Internal Medicine

## 2017-02-02 ENCOUNTER — Telehealth: Payer: Self-pay

## 2017-02-02 ENCOUNTER — Inpatient Hospital Stay (HOSPITAL_COMMUNITY)
Admission: EM | Admit: 2017-02-02 | Discharge: 2017-02-05 | DRG: 180 | Disposition: A | Discharge status: Home / Self Care | Payer: BLUE CROSS/BLUE SHIELD | Attending: Family Medicine | Admitting: Family Medicine

## 2017-02-02 DIAGNOSIS — J189 Pneumonia, unspecified organism: Secondary | ICD-10-CM

## 2017-02-02 DIAGNOSIS — I4891 Unspecified atrial fibrillation: Secondary | ICD-10-CM

## 2017-02-02 DIAGNOSIS — C3492 Malignant neoplasm of unspecified part of left bronchus or lung: Secondary | ICD-10-CM | POA: Diagnosis present

## 2017-02-02 DIAGNOSIS — R0602 Shortness of breath: Secondary | ICD-10-CM | POA: Diagnosis present

## 2017-02-02 DIAGNOSIS — E1151 Type 2 diabetes mellitus with diabetic peripheral angiopathy without gangrene: Secondary | ICD-10-CM | POA: Diagnosis present

## 2017-02-02 DIAGNOSIS — I5033 Acute on chronic diastolic (congestive) heart failure: Secondary | ICD-10-CM | POA: Diagnosis present

## 2017-02-02 DIAGNOSIS — G4700 Insomnia, unspecified: Secondary | ICD-10-CM | POA: Diagnosis present

## 2017-02-02 DIAGNOSIS — I2510 Atherosclerotic heart disease of native coronary artery without angina pectoris: Secondary | ICD-10-CM | POA: Diagnosis present

## 2017-02-02 DIAGNOSIS — Z79899 Other long term (current) drug therapy: Secondary | ICD-10-CM

## 2017-02-02 DIAGNOSIS — Z87891 Personal history of nicotine dependence: Secondary | ICD-10-CM

## 2017-02-02 DIAGNOSIS — Z7901 Long term (current) use of anticoagulants: Secondary | ICD-10-CM

## 2017-02-02 DIAGNOSIS — D500 Iron deficiency anemia secondary to blood loss (chronic): Secondary | ICD-10-CM | POA: Diagnosis not present

## 2017-02-02 DIAGNOSIS — E785 Hyperlipidemia, unspecified: Secondary | ICD-10-CM | POA: Diagnosis present

## 2017-02-02 DIAGNOSIS — J90 Pleural effusion, not elsewhere classified: Secondary | ICD-10-CM | POA: Diagnosis present

## 2017-02-02 DIAGNOSIS — I110 Hypertensive heart disease with heart failure: Secondary | ICD-10-CM | POA: Diagnosis present

## 2017-02-02 DIAGNOSIS — I482 Chronic atrial fibrillation: Secondary | ICD-10-CM | POA: Diagnosis present

## 2017-02-02 DIAGNOSIS — J181 Lobar pneumonia, unspecified organism: Secondary | ICD-10-CM | POA: Diagnosis not present

## 2017-02-02 DIAGNOSIS — M170 Bilateral primary osteoarthritis of knee: Secondary | ICD-10-CM | POA: Diagnosis present

## 2017-02-02 DIAGNOSIS — Z95820 Peripheral vascular angioplasty status with implants and grafts: Secondary | ICD-10-CM

## 2017-02-02 DIAGNOSIS — J9811 Atelectasis: Secondary | ICD-10-CM | POA: Diagnosis present

## 2017-02-02 DIAGNOSIS — J449 Chronic obstructive pulmonary disease, unspecified: Secondary | ICD-10-CM | POA: Diagnosis present

## 2017-02-02 DIAGNOSIS — Z79891 Long term (current) use of opiate analgesic: Secondary | ICD-10-CM | POA: Diagnosis not present

## 2017-02-02 DIAGNOSIS — Z9221 Personal history of antineoplastic chemotherapy: Secondary | ICD-10-CM

## 2017-02-02 DIAGNOSIS — K219 Gastro-esophageal reflux disease without esophagitis: Secondary | ICD-10-CM | POA: Diagnosis present

## 2017-02-02 DIAGNOSIS — C781 Secondary malignant neoplasm of mediastinum: Secondary | ICD-10-CM | POA: Diagnosis present

## 2017-02-02 DIAGNOSIS — Z86010 Personal history of colonic polyps: Secondary | ICD-10-CM

## 2017-02-02 DIAGNOSIS — M109 Gout, unspecified: Secondary | ICD-10-CM | POA: Diagnosis present

## 2017-02-02 DIAGNOSIS — Z888 Allergy status to other drugs, medicaments and biological substances status: Secondary | ICD-10-CM

## 2017-02-02 DIAGNOSIS — Z823 Family history of stroke: Secondary | ICD-10-CM

## 2017-02-02 DIAGNOSIS — D6481 Anemia due to antineoplastic chemotherapy: Secondary | ICD-10-CM | POA: Diagnosis present

## 2017-02-02 DIAGNOSIS — Z7984 Long term (current) use of oral hypoglycemic drugs: Secondary | ICD-10-CM | POA: Diagnosis not present

## 2017-02-02 DIAGNOSIS — R0989 Other specified symptoms and signs involving the circulatory and respiratory systems: Secondary | ICD-10-CM | POA: Diagnosis present

## 2017-02-02 DIAGNOSIS — E119 Type 2 diabetes mellitus without complications: Secondary | ICD-10-CM

## 2017-02-02 DIAGNOSIS — I5032 Chronic diastolic (congestive) heart failure: Secondary | ICD-10-CM | POA: Diagnosis present

## 2017-02-02 DIAGNOSIS — Z8249 Family history of ischemic heart disease and other diseases of the circulatory system: Secondary | ICD-10-CM

## 2017-02-02 DIAGNOSIS — I480 Paroxysmal atrial fibrillation: Secondary | ICD-10-CM | POA: Diagnosis present

## 2017-02-02 DIAGNOSIS — Z8673 Personal history of transient ischemic attack (TIA), and cerebral infarction without residual deficits: Secondary | ICD-10-CM

## 2017-02-02 DIAGNOSIS — I10 Essential (primary) hypertension: Secondary | ICD-10-CM | POA: Diagnosis present

## 2017-02-02 DIAGNOSIS — D649 Anemia, unspecified: Secondary | ICD-10-CM

## 2017-02-02 DIAGNOSIS — Z923 Personal history of irradiation: Secondary | ICD-10-CM

## 2017-02-02 DIAGNOSIS — Z833 Family history of diabetes mellitus: Secondary | ICD-10-CM

## 2017-02-02 DIAGNOSIS — Z8349 Family history of other endocrine, nutritional and metabolic diseases: Secondary | ICD-10-CM

## 2017-02-02 LAB — CBC WITH DIFFERENTIAL/PLATELET
BASOS ABS: 0 10*3/uL (ref 0.0–0.1)
BASOS PCT: 0 %
EOS ABS: 0 10*3/uL (ref 0.0–0.7)
Eosinophils Relative: 1 %
HCT: 22.8 % — ABNORMAL LOW (ref 39.0–52.0)
HEMOGLOBIN: 7.5 g/dL — AB (ref 13.0–17.0)
LYMPHS ABS: 0.7 10*3/uL (ref 0.7–4.0)
LYMPHS PCT: 15 %
MCH: 33.6 pg (ref 26.0–34.0)
MCHC: 32.9 g/dL (ref 30.0–36.0)
MCV: 102.2 fL — ABNORMAL HIGH (ref 78.0–100.0)
Monocytes Absolute: 0.4 10*3/uL (ref 0.1–1.0)
Monocytes Relative: 8 %
NEUTROS ABS: 3.5 10*3/uL (ref 1.7–7.7)
Neutrophils Relative %: 76 %
Platelets: 88 10*3/uL — ABNORMAL LOW (ref 150–400)
RBC: 2.23 MIL/uL — ABNORMAL LOW (ref 4.22–5.81)
RDW: 23.3 % — AB (ref 11.5–15.5)
WBC: 4.6 10*3/uL (ref 4.0–10.5)

## 2017-02-02 LAB — BRAIN NATRIURETIC PEPTIDE: B Natriuretic Peptide: 452.2 pg/mL — ABNORMAL HIGH (ref 0.0–100.0)

## 2017-02-02 LAB — COMPREHENSIVE METABOLIC PANEL
ALT: 24 U/L (ref 17–63)
ANION GAP: 7 (ref 5–15)
AST: 21 U/L (ref 15–41)
Albumin: 3.5 g/dL (ref 3.5–5.0)
Alkaline Phosphatase: 50 U/L (ref 38–126)
BUN: 12 mg/dL (ref 6–20)
CALCIUM: 9.1 mg/dL (ref 8.9–10.3)
CO2: 24 mmol/L (ref 22–32)
CREATININE: 0.91 mg/dL (ref 0.61–1.24)
Chloride: 108 mmol/L (ref 101–111)
GLUCOSE: 125 mg/dL — AB (ref 65–99)
Potassium: 3.5 mmol/L (ref 3.5–5.1)
Sodium: 139 mmol/L (ref 135–145)
TOTAL PROTEIN: 6.5 g/dL (ref 6.5–8.1)
Total Bilirubin: 0.7 mg/dL (ref 0.3–1.2)

## 2017-02-02 LAB — URINALYSIS, ROUTINE W REFLEX MICROSCOPIC
Bilirubin Urine: NEGATIVE
Glucose, UA: NEGATIVE mg/dL
HGB URINE DIPSTICK: NEGATIVE
KETONES UR: NEGATIVE mg/dL
Leukocytes, UA: NEGATIVE
Nitrite: NEGATIVE
PH: 6 (ref 5.0–8.0)
Protein, ur: NEGATIVE mg/dL
SPECIFIC GRAVITY, URINE: 1.011 (ref 1.005–1.030)

## 2017-02-02 LAB — MAGNESIUM: Magnesium: 1.8 mg/dL (ref 1.7–2.4)

## 2017-02-02 LAB — I-STAT TROPONIN, ED: Troponin i, poc: 0.01 ng/mL (ref 0.00–0.08)

## 2017-02-02 LAB — PHOSPHORUS: PHOSPHORUS: 3.9 mg/dL (ref 2.5–4.6)

## 2017-02-02 LAB — I-STAT CG4 LACTIC ACID, ED: LACTIC ACID, VENOUS: 0.9 mmol/L (ref 0.5–1.9)

## 2017-02-02 MED ORDER — METOPROLOL SUCCINATE ER 50 MG PO TB24
50.0000 mg | ORAL_TABLET | Freq: Every day | ORAL | Status: DC
  Administered 2017-02-03 – 2017-02-04 (×2): 50 mg via ORAL
  Filled 2017-02-02 (×2): qty 1

## 2017-02-02 MED ORDER — METFORMIN HCL ER 500 MG PO TB24
1500.0000 mg | ORAL_TABLET | Freq: Every day | ORAL | Status: DC
  Administered 2017-02-03: 1500 mg via ORAL
  Filled 2017-02-02: qty 3

## 2017-02-02 MED ORDER — DM-GUAIFENESIN ER 30-600 MG PO TB12
1.0000 | ORAL_TABLET | Freq: Two times a day (BID) | ORAL | Status: DC
  Administered 2017-02-02 – 2017-02-05 (×6): 1 via ORAL
  Filled 2017-02-02 (×6): qty 1

## 2017-02-02 MED ORDER — FENOFIBRATE 160 MG PO TABS
160.0000 mg | ORAL_TABLET | Freq: Every day | ORAL | Status: DC
  Administered 2017-02-03 – 2017-02-04 (×2): 160 mg via ORAL
  Filled 2017-02-02 (×2): qty 1

## 2017-02-02 MED ORDER — LEVALBUTEROL HCL 0.63 MG/3ML IN NEBU: 0.6300 mg | INHALATION_SOLUTION | Freq: Four times a day (QID) | RESPIRATORY_TRACT | Status: DC | PRN

## 2017-02-02 MED ORDER — SODIUM CHLORIDE 0.9 % IV BOLUS (SEPSIS)
1000.0000 mL | Freq: Once | INTRAVENOUS | Status: AC
  Administered 2017-02-02: 1000 mL via INTRAVENOUS

## 2017-02-02 MED ORDER — IPRATROPIUM BROMIDE 0.02 % IN SOLN
0.5000 mg | Freq: Three times a day (TID) | RESPIRATORY_TRACT | Status: DC
  Administered 2017-02-03 (×2): 0.5 mg via RESPIRATORY_TRACT
  Filled 2017-02-02 (×2): qty 2.5

## 2017-02-02 MED ORDER — ZOLPIDEM TARTRATE 10 MG PO TABS
10.0000 mg | ORAL_TABLET | Freq: Every evening | ORAL | Status: DC | PRN
  Administered 2017-02-03 – 2017-02-04 (×2): 10 mg via ORAL
  Filled 2017-02-02 (×2): qty 1

## 2017-02-02 MED ORDER — FUROSEMIDE 10 MG/ML IJ SOLN
40.0000 mg | Freq: Two times a day (BID) | INTRAMUSCULAR | Status: DC
  Administered 2017-02-02 – 2017-02-03 (×2): 40 mg via INTRAVENOUS
  Filled 2017-02-02 (×2): qty 4

## 2017-02-02 MED ORDER — MAGNESIUM SULFATE 2 GM/50ML IV SOLN
2.0000 g | Freq: Once | INTRAVENOUS | Status: AC
  Administered 2017-02-02: 2 g via INTRAVENOUS
  Filled 2017-02-02: qty 50

## 2017-02-02 MED ORDER — LEVOFLOXACIN IN D5W 750 MG/150ML IV SOLN
750.0000 mg | Freq: Once | INTRAVENOUS | Status: AC
  Administered 2017-02-02: 750 mg via INTRAVENOUS
  Filled 2017-02-02: qty 150

## 2017-02-02 MED ORDER — ROSUVASTATIN CALCIUM 20 MG PO TABS
40.0000 mg | ORAL_TABLET | Freq: Every evening | ORAL | Status: DC
  Administered 2017-02-03: 40 mg via ORAL
  Filled 2017-02-02 (×2): qty 2

## 2017-02-02 MED ORDER — PIOGLITAZONE HCL 15 MG PO TABS
15.0000 mg | ORAL_TABLET | Freq: Every day | ORAL | Status: DC
  Administered 2017-02-03 – 2017-02-05 (×3): 15 mg via ORAL
  Filled 2017-02-02 (×3): qty 1

## 2017-02-02 MED ORDER — LIDOCAINE-PRILOCAINE 2.5-2.5 % EX CREA: 1.0000 "application " | TOPICAL_CREAM | CUTANEOUS | Status: DC | PRN

## 2017-02-02 MED ORDER — POTASSIUM CHLORIDE CRYS ER 20 MEQ PO TBCR
40.0000 meq | EXTENDED_RELEASE_TABLET | Freq: Once | ORAL | Status: AC
  Administered 2017-02-02: 40 meq via ORAL
  Filled 2017-02-02: qty 2

## 2017-02-02 MED ORDER — OXYCODONE-ACETAMINOPHEN 5-325 MG PO TABS: 1.0000 | ORAL_TABLET | Freq: Four times a day (QID) | ORAL | Status: DC | PRN

## 2017-02-02 MED ORDER — GLIMEPIRIDE 4 MG PO TABS
4.0000 mg | ORAL_TABLET | Freq: Every day | ORAL | Status: DC
  Administered 2017-02-03 – 2017-02-04 (×2): 4 mg via ORAL
  Filled 2017-02-02 (×2): qty 1

## 2017-02-02 MED ORDER — ONDANSETRON HCL 4 MG/2ML IJ SOLN: 4.0000 mg | Freq: Four times a day (QID) | INTRAMUSCULAR | Status: DC | PRN

## 2017-02-02 MED ORDER — LEVALBUTEROL HCL 0.63 MG/3ML IN NEBU
0.6300 mg | INHALATION_SOLUTION | Freq: Four times a day (QID) | RESPIRATORY_TRACT | Status: DC
  Administered 2017-02-02: 0.63 mg via RESPIRATORY_TRACT
  Filled 2017-02-02: qty 3

## 2017-02-02 MED ORDER — APIXABAN 5 MG PO TABS
5.0000 mg | ORAL_TABLET | Freq: Two times a day (BID) | ORAL | Status: DC
  Administered 2017-02-02 – 2017-02-05 (×6): 5 mg via ORAL
  Filled 2017-02-02 (×6): qty 1

## 2017-02-02 MED ORDER — ONDANSETRON HCL 4 MG PO TABS: 4.0000 mg | ORAL_TABLET | Freq: Four times a day (QID) | ORAL | Status: DC | PRN

## 2017-02-02 MED ORDER — LEVOFLOXACIN IN D5W 750 MG/150ML IV SOLN
750.0000 mg | INTRAVENOUS | Status: DC
  Administered 2017-02-03 – 2017-02-04 (×2): 750 mg via INTRAVENOUS
  Filled 2017-02-02 (×2): qty 150

## 2017-02-02 MED ORDER — DILTIAZEM HCL ER COATED BEADS 120 MG PO CP24
120.0000 mg | ORAL_CAPSULE | Freq: Every day | ORAL | Status: DC
  Administered 2017-02-02 – 2017-02-05 (×4): 120 mg via ORAL
  Filled 2017-02-02 (×4): qty 1

## 2017-02-02 MED ORDER — FUROSEMIDE 10 MG/ML IJ SOLN: 40.0000 mg | Freq: Once | INTRAMUSCULAR | Status: DC

## 2017-02-02 MED ORDER — IPRATROPIUM BROMIDE 0.02 % IN SOLN
0.5000 mg | Freq: Four times a day (QID) | RESPIRATORY_TRACT | Status: DC
  Administered 2017-02-02: 0.5 mg via RESPIRATORY_TRACT
  Filled 2017-02-02: qty 2.5

## 2017-02-02 MED ORDER — LEVALBUTEROL HCL 0.63 MG/3ML IN NEBU
0.6300 mg | INHALATION_SOLUTION | Freq: Three times a day (TID) | RESPIRATORY_TRACT | Status: DC
  Administered 2017-02-03 (×2): 0.63 mg via RESPIRATORY_TRACT
  Filled 2017-02-02 (×2): qty 3

## 2017-02-02 NOTE — ED Triage Notes (Signed)
Pt complaint of worsening SOB for 3 days; receiving chemo for lung cancer. Pt pale.

## 2017-02-02 NOTE — ED Notes (Signed)
Bed: LJ44 Expected date:  Expected time:  Means of arrival:  Comments: TRIAGE 1

## 2017-02-02 NOTE — ED Provider Notes (Signed)
[facility] DEPT Provider Note   CSN: [id number] Arrival date & time: 02/02/17  1152     History   Chief Complaint Chief Complaint  Patient presents with  . Shortness of Breath    HPI Jesse Avila is a 66 y.o. male.  HPI   Patient is a 66 year old male with history of hypertension, hyperlipidemia, PVD, diabetes, CVA, Afib (on Eliquis) and lung cancer (currently receiving chemo tx) presents to the ED from home with complaint of shortness of breath. Patient reports over the past patient reports over the past 3 days he has had gradually worsening shortness of breath. He also reports having a nonproductive cough and mildly worsening leg swelling over the past few weeks. He notes he received his last chemotherapy treatment 3 weeks ago and also reports having a blood transfusion performed at that time. Wife at bedside reports patient has been intermittently confused over the past few months and states he has difficulty remembering things. Denies fever, chills, body ache, headache, dizziness, hemoptysis, chest pain, palpitations, abdominal pain, nausea, vomiting, diarrhea, urinary symptoms, blood in urine or stool.   PCP- Dr. Marisue Humble Oncologist- Dr. Julien Nordmann  Past Medical History:  Diagnosis Date  . Carotid artery occlusion   . DJD (degenerative joint disease) of knee   . Essential hypertension, benign    takes Benicar daily  . Goals of care, counseling/discussion 09/18/2016  . History of colon polyps    benign  . History of gout   . History of radiation therapy 09/30/16-11/10/16   left lung 60 Gy in 30 fractions  . Hyperlipidemia    takes Fenofibrate and Crestor daily  . Insomnia    takes Ambien nightly as needed  . Joint pain   . Lung cancer ([place])   . Nocturia   . Peripheral vascular disease ([place])   . Primary localized osteoarthritis of left knee 12/26/2014  . Stroke [facility]) 2015?   takes Plavix daily as well as Pletal,not on plavix at present 05/15/15  . 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 CAP (community acquired pneumonia) 02/02/2017  . Port catheter in place 11/05/2016  . Atrial fibrillation with RVR ([place])   . Malignant neoplasm of lung ([place])   . Anemia   . Atrial fibrillation ([place]) 10/21/2016  . Small cell lung carcinoma, left ([place]) 09/18/2016  . Goals of care, counseling/discussion 09/18/2016  . Encounter for antineoplastic chemotherapy 09/18/2016  . Hilar lymphadenopathy   . COPD (chronic obstructive pulmonary disease) ([place]) 09/03/2016  . PAD (peripheral artery disease) ([place]) 05/17/2015  . Primary localized osteoarthritis of left knee 12/26/2014  . Knee osteoarthritis 12/26/2014  . Occlusion and stenosis of carotid artery without mention of cerebral infarction 04/21/2014  . History of stroke 04/21/2014  . Carotid stenosis with cerebral infarction less than 8 weeks ago 04/16/2014  . Diabetes mellitus, type 2 ([place]) 04/14/2014  . Obesity 04/14/2014  . Insomnia 10/18/2013  . Preop cardiovascular exam 12/12/2011  . DJD (degenerative joint disease) of knee   . ED (erectile dysfunction)   . GERD (gastroesophageal reflux disease)   . Essential hypertension, benign   . Peripheral vascular disease ([place])   . Hyperlipidemia   . Critical lower limb ischemia 11/19/2011    Past Surgical History:  Procedure Laterality Date  . ABDOMINAL AORTAGRAM N/A 03/29/2012   Procedure: ABDOMINAL AORTAGRAM;  Surgeon: Angelia Mould, MD;  Location: [facility] CATH LAB;  Service: Cardiovascular;  Laterality: N/A;  . COLONOSCOPY    . ENDARTERECTOMY Right 05/09/2014   Procedure: RIGHT CAROTID ENDARTERECTOMY WITH PATCH ANGIOPLASTY;  Surgeon: Conrad Clacks [place], MD;  Location: [facility];  Service: Vascular;  Laterality: Right;  . ENDARTERECTOMY FEMORAL Right 05/17/2015   Procedure: ENDARTERECTOMY FEMORAL;   Surgeon: Angelia Mould, MD;  Location: [facility];  Service: Vascular;  Laterality: Right;  . ENDOBRONCHIAL ULTRASOUND Bilateral 09/08/2016   Procedure: ENDOBRONCHIAL ULTRASOUND;  Surgeon: Rigoberto Noel, MD;  Location: [facility] ENDOSCOPY;  Service: Cardiopulmonary;  Laterality: Bilateral;  . FEMORAL-POPLITEAL BYPASS GRAFT Right 05/17/2015   Procedure: BYPASS GRAFT FEMORAL-BELOW KNEE POPLITEAL ARTERY, using gortex propaten graft 6 mm x 80 cm;  Surgeon: Angelia Mould, MD;  Location: [facility];  Service: Vascular;  Laterality: Right;  . IR GENERIC HISTORICAL  09/25/2016   IR FLUORO GUIDE PORT INSERTION RIGHT 09/25/2016 Markus Daft, MD [facility]-INTERV RAD  . IR GENERIC HISTORICAL  09/25/2016   IR US GUIDE VASC ACCESS RIGHT 09/25/2016 Markus Daft, MD [facility]-INTER[facility] RAD  . KNEE ARTHROSCOPY Right 11/2009  . LOWER EXTREMITY ANGIOGRAM Bilateral 03/23/2015   Procedure: Lower Extremity Angiogram;  Surgeon: Angelia Mould, MD;  Location: [facility] CV LAB;  Service: Cardiovascular;  Laterality: Bilateral;  . MENISCUS REPAIR  11/2009  . PARTIAL KNEE ARTHROPLASTY Left 12/26/2014   Procedure: UNICOMPARTMENTAL KNEE;  Surgeon: Marchia Bond, MD;  Location: [facility];  Service: Orthopedics;  Laterality: Left;  . PATCH ANGIOPLASTY Right 05/17/2015   Procedure: VEIN PATCH ANGIOPLASTY ILEOFEMORAL ARTERY;  Surgeon: Angelia Mould, MD;  Location: [facility];  Service: Vascular;  Laterality: Right;  . PERCUTANEOUS STENT INTERVENTION N/A 05/10/2012   Procedure: PERCUTANEOUS STENT INTERVENTION;  Surgeon: Angelia Mould, MD;  Location: [facility] CATH LAB;  Service: Cardiovascular;  Laterality: N/A;  . PERIPHERAL VASCULAR CATHETERIZATION N/A 03/23/2015   Procedure: Abdominal Aortogram;  Surgeon: Angelia Mould, MD;  Location: [facility] CV LAB;  Service: Cardiovascular;  Laterality: N/A;  . STENTS     PLACED IN ??BOTH LEGS   2013?       Home Medications    Prior to Admission medications   Medication Sig Start Date End Date  Taking? Authorizing Provider  apixaban (ELIQUIS) 5 MG TABS tablet Take 1 tablet (5 mg total) by mouth 2 (two) times daily. 10/22/16  Yes Eugenie Filler, MD  Blood Glucose Monitoring Suppl (ACCU-CHEK NANO SMARTVIEW) w/Device KIT by Does not apply route.   Yes [provider]  dextromethorphan-guaiFENesin (MUCINEX DM) 30-600 MG 12hr tablet Take 1 tablet by mouth 2 (two) times daily.   Yes [provider]  diltiazem (CARDIZEM CD) 120 MG 24 hr capsule Take 1 capsule (120 mg total) by mouth daily. 10/23/16  Yes Eugenie Filler, MD  fenofibrate 160 MG tablet Take 160 mg by mouth daily.   Yes [provider]  glimepiride (AMARYL) 4 MG tablet Take 4 mg by mouth daily with breakfast.   Yes [provider]  guaiFENesin (ROBITUSSIN) 100 MG/5ML liquid Take 200 mg by mouth 3 (three) times daily as needed for cough.   Yes [provider]  lidocaine-prilocaine (EMLA) cream Apply 1 application topically as needed. 11/26/16  Yes Curt Bears, MD  metFORMIN (GLUCOPHAGE-XR) 500 MG 24 hr tablet Take 500 mg by mouth 3 (three) times daily. 01/07/17  Yes [provider]  metoprolol succinate (TOPROL-XL) 50 MG 24 hr tablet Take 50 mg by mouth  daily. Take with or immediately following a meal.   Yes [provider]  oxyCODONE-acetaminophen (PERCOCET/ROXICET) 5-325 MG tablet Take 1 tablet by mouth every 6 (six) hours as needed for severe pain. 12/25/16  Yes Curt Bears, MD  phenylephrine (SUDAFED PE) 10 MG TABS tablet Take 10 mg by mouth every 4 (four) hours as needed.   Yes [provider]  pioglitazone (ACTOS) 15 MG tablet Take 15 mg by mouth daily.   Yes [provider]  rosuvastatin (CRESTOR) 40 MG tablet Take 40 mg by mouth every evening. For cholesterol   Yes [provider]  zolpidem (AMBIEN) 10 MG tablet Take 10 mg by mouth at bedtime as needed for sleep.  01/26/15  Yes [provider]  metFORMIN (GLUCOPHAGE) 500  MG tablet Take 1 tablet (500 mg total) by mouth 3 (three) times daily. Patient not taking: Reported on 02/02/2017 04/18/14   Cristal Ford, DO  prochlorperazine (COMPAZINE) 10 MG tablet Take 1 tablet (10 mg total) by mouth every 6 (six) hours as needed for nausea or vomiting. Patient not taking: Reported on 11/26/2016 09/18/16   Curt Bears, MD    Family History Family History  Problem Relation Age of Onset  . Diabetes Mother   . Hypertension Mother   . Heart disease Mother        Coronary Artery Bypass Graft  . Hyperlipidemia Mother   . Heart attack Mother   . Hypertension Father   . Hyperlipidemia Sister   . Stroke Sister     Social History Social History  Substance Use Topics  . Smoking status: Former Smoker    Packs/day: 0.00    Years: 0.00  . Smokeless tobacco: Never Used     Comment: quit smoking 4 yrs ago  . Alcohol use 0.0 oz/week     Comment: beer- occasionally     Allergies   Cialis [tadalafil]   Review of Systems Review of Systems  Respiratory: Positive for cough and shortness of breath.   Cardiovascular: Positive for leg swelling.  All other systems reviewed and are negative.    Physical Exam Updated Vital Signs BP 104/74   Pulse (!) 51   Temp 98.3 F (36.8 C)   Resp (!) 24   SpO2 90%   Physical Exam  Constitutional: He is oriented to person, place, and time. He appears well-developed and well-nourished.  Pale appearing male in no acute respiratory distress  HENT:  Head: Normocephalic and atraumatic.  Mouth/Throat: Oropharynx is clear and moist. No oropharyngeal exudate.  Eyes: Conjunctivae and EOM are normal. Pupils are equal, round, and reactive to light. Right eye exhibits no discharge. Left eye exhibits no discharge. No scleral icterus.  Neck: Normal range of motion. Neck supple.  Cardiovascular: Normal rate, regular rhythm, normal heart sounds and intact distal pulses.   Pulmonary/Chest: Effort normal. No respiratory distress. He has  wheezes. He has no rales. He exhibits no tenderness.  Mild expiratory wheezing in bibasilar lobes, L>R  Abdominal: Soft. Bowel sounds are normal. He exhibits no distension and no mass. There is no tenderness. There is no rebound and no guarding.  Musculoskeletal: Normal range of motion. He exhibits edema (1+ pitting edema to BLE).  Neurological: He is alert and oriented to person, place, and time. He has normal strength. No cranial nerve deficit or sensory deficit. Coordination normal.  Skin: Skin is warm and dry.  Nursing note and vitals reviewed.    ED Treatments / Results  Labs (all labs ordered are  listed, but only abnormal results are displayed) Labs Reviewed  COMPREHENSIVE METABOLIC PANEL - Abnormal; Notable for the following:       Result Value   Glucose, Bld 125 (*)    All other components within normal limits  CBC WITH DIFFERENTIAL/PLATELET - Abnormal; Notable for the following:    RBC 2.23 (*)    Hemoglobin 7.5 (*)    HCT 22.8 (*)    MCV 102.2 (*)    RDW 23.3 (*)    Platelets 88 (*)    All other components within normal limits  URINALYSIS, ROUTINE W REFLEX MICROSCOPIC  BRAIN NATRIURETIC PEPTIDE  MAGNESIUM  PHOSPHORUS  I-STAT CG4 LACTIC ACID, ED  I-STAT TROPOININ, ED    EKG  EKG Interpretation  Date/Time:  Monday February 02 2017 12:11:56 EDT Ventricular Rate:  112 PR Interval:    QRS Duration: 105 QT Interval:  339 QTC Calculation: 463 R Axis:   78 Text Interpretation:  Atrial fibrillation Multiple ventricular premature complexes Low voltage, precordial leads Consider anterior infarct Borderline repolarization abnormality afib is new Confirmed by Varney Biles [phone]) on 02/02/2017 2:00:28 PM       Radiology Dg Chest 2 View  Result Date: 02/02/2017 CLINICAL DATA:  T shortness of breath over the past several days. History of lung malignancy. Last chemotherapy performed 3 weeks ago. The patient has previously undergone radiation therapy. Former smoker. History  of hypertension and diabetes and CVA. EXAM: CHEST  2 VIEW COMPARISON:  CT scan chest of Dec 16, 2016 and chest x-ray of October 21, 2016. FINDINGS: The left perihilar and suprahilar region exhibits increased density consistent with pneumonia superimposed upon known underlying mass. The left lower lung is clear but there is a small left pleural effusion. The right lung is clear with exception of a small pleural effusion. The cardiac silhouette is enlarged. The pulmonary vascularity is normal. The power port catheter tip projects over the midportion of the SVC. The observed bony thorax exhibits no acute abnormality. IMPRESSION: Increased prominence of left perihilar and suprahilar densities which may reflect pneumonia or atelectasis superimposed upon known underlying mass lesions. New small bilateral pleural effusions greatest on the left. No overt pulmonary vascular congestion. Electronically Signed   By: David  Martinique M.D.   On: 02/02/2017 14:05    Procedures Procedures (including critical care time)  Medications Ordered in ED Medications  sodium chloride 0.9 % bolus 1,000 mL (not administered)  levofloxacin (LEVAQUIN) IVPB 750 mg (not administered)     Initial Impression / Assessment and Plan / ED Course  I have reviewed the triage vital signs and the nursing notes.  Pertinent labs & imaging results that were available during my care of the patient were reviewed by me and considered in my medical decision making (see chart for details).     Patient presents with gradually worsening shortness of breath over the past 3 days with reported nonproductive cough over the past week. Denies fever or chest pain. PMH of hypertension, hyperlipidemia, PVD, diabetes, CVA, Afib (on Eliquis) and lung cancer (currently receiving radiation/chemo tx). Initial vitals revealed HR 111, RR 24, afebrile, normotensive. Exam showed mild expiratory wheezing in bibasilar lobes and 1+ pitting edema to BLE, otherwise  unremarkable. EKG showed afib, HR 112. Lactic 0.9. Trop negative. Hgb 7.5; chart review shows most recent hgb 7.3 on 01/28/17, hx chronic anemia since starting chemo, reports last transfusion 3 months ago). CXR showed left perihilar and suprahilar densities concerning for pneumonia, new small bilateral pleural effusions. Discussed pt  with Dr. Kathrynn Humble who also evaluated pt in the ED. Suspect sxs due to CAP causing secondary afib. Pt started on IV levoquin. Discussed results and plan for admission with pt and family. Consulted hospitalist, Dr. Olevia Bowens agrees to admission.   Final Clinical Impressions(s) / ED Diagnoses   Final diagnoses:  Community acquired pneumonia of left lung, unspecified part of lung  Atrial fibrillation, unspecified type ([place])  Anemia, unspecified type    New Prescriptions New Prescriptions   No medications on file     Nona Dell, Hershal Coria 02/02/17 [place], [place], MD 02/03/17 [phone]

## 2017-02-02 NOTE — H&P (Signed)
History and Physical    Jesse Avila TMA:[id number] DOB: 1950-11-13 DOA: 02/02/2017  PCP: Blair Heys, MD   Patient coming from: Home.  I have personally briefly reviewed patient's old medical records in [facility]  Chief Complaint: Shortness of breath.  HPI: Jesse Avila is a 66 y.o. male with medical history significant of atrial fibrillation, coronary artery occlusion, osteoarthritis of the knees, essential hypertension, colon polyps, gout, hyperlipidemia, insomnia, peripheral vascular disease, history of CVA, type 2 diabetes, history of lung cancer history of radiotherapy, last chemotherapy about 3 weeks ago who is coming to the emergency department with complaints of progressively worse dyspnea for the past 3 days associated with nonproductive cough, orthopnea and lower extremity edema. He denies fever, body aches, but complains of chills and fatigue. He denies chest pain, palpitations, dizziness, diaphoresis, PND, nausea, emesis, diarrhea, melena or hematochezia. He denies dysuria, frequency or hematuria.  ED Course: His initial vital signs temperature 98.64F, pulse 121, blood pressure 133/73 mmHg, respirations 24 and O2 sat 98%. The patient received supplemental oxygen and levofloxacin 750 mg IVPB in the emergency department. This workup shows WBC 4.6, hemoglobin 7.5 g/dL and platelets 88 (close to baseline). EKG was atrial fibrillation with RVR, multiple VPCs, low voltage. His troponin was normal. BMP showed 452 pg/mL. Lactic acid was 0.90 mmol/L. His CMP showed a glucose of 125 mg/dL, but was otherwise within normal limits.   His chest radiograph showed increased prominence of left perihilar and suprahilar areas consistent with pneumonia or atelectasis. Bilateral small pleural effusions was overt pulmonary vascular congestion.  Review of Systems: As per HPI otherwise 10 point review of systems negative.    Past Medical History:  Diagnosis Date  . Carotid artery occlusion   .  DJD (degenerative joint disease) of knee   . Essential hypertension, benign    takes Benicar daily  . Goals of care, counseling/discussion 09/18/2016  . History of colon polyps    benign  . History of gout   . History of radiation therapy 09/30/16-11/10/16   left lung 60 Gy in 30 fractions  . Hyperlipidemia    takes Fenofibrate and Crestor daily  . Insomnia    takes Ambien nightly as needed  . Joint pain   . Lung cancer (HCC)   . Nocturia   . Peripheral vascular disease (HCC)   . Primary localized osteoarthritis of left knee 12/26/2014  . Stroke [facility]) 2015?   takes Plavix daily as well as Pletal,not on plavix at present 05/15/15  . 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 ABDOMINAL AORTAGRAM N/A 03/29/2012   Procedure: ABDOMINAL Ronny Flurry;  Surgeon: Chuck Hint, MD;  Location: [facility] CATH LAB;  Service: Cardiovascular;  Laterality: N/A;  . COLONOSCOPY    . ENDARTERECTOMY Right 05/09/2014   Procedure: RIGHT CAROTID ENDARTERECTOMY WITH PATCH ANGIOPLASTY;  Surgeon: Fransisco Hertz, MD;  Location: [facility] OR;  Service: Vascular;  Laterality: Right;  . ENDARTERECTOMY FEMORAL Right 05/17/2015   Procedure: ENDARTERECTOMY FEMORAL;  Surgeon: Chuck Hint, MD;  Location: [facility] OR;  Service: Vascular;  Laterality: Right;  . ENDOBRONCHIAL ULTRASOUND Bilateral 09/08/2016   Procedure: ENDOBRONCHIAL ULTRASOUND;  Surgeon: Oretha Milch, MD;  Location: [facility] ENDOSCOPY;  Service: Cardiopulmonary;  Laterality: Bilateral;  . FEMORAL-POPLITEAL BYPASS GRAFT Right 05/17/2015   Procedure: BYPASS GRAFT FEMORAL-BELOW KNEE POPLITEAL ARTERY, using  gortex propaten graft 6 mm x 80 cm;  Surgeon: Angelia Mould, MD;  Location: [place];  Service: Vascular;  Laterality: Right;  . IR GENERIC HISTORICAL  09/25/2016   IR FLUORO GUIDE PORT INSERTION  RIGHT 09/25/2016 Markus Daft, MD [facility]-INTERV RAD  . IR GENERIC HISTORICAL  09/25/2016   IR US GUIDE VASC ACCESS RIGHT 09/25/2016 Markus Daft, MD [facility]-INTERV RAD  . KNEE ARTHROSCOPY Right 11/2009  . LOWER EXTREMITY ANGIOGRAM Bilateral 03/23/2015   Procedure: Lower Extremity Angiogram;  Surgeon: Angelia Mould, MD;  Location: [place] CV LAB;  Service: Cardiovascular;  Laterality: Bilateral;  . MENISCUS REPAIR  11/2009  . PARTIAL KNEE ARTHROPLASTY Left 12/26/2014   Procedure: UNICOMPARTMENTAL KNEE;  Surgeon: Marchia Bond, MD;  Location: [place];  Service: Orthopedics;  Laterality: Left;  . PATCH ANGIOPLASTY Right 05/17/2015   Procedure: VEIN PATCH ANGIOPLASTY ILEOFEMORAL ARTERY;  Surgeon: Angelia Mould, MD;  Location: [place];  Service: Vascular;  Laterality: Right;  . PERCUTANEOUS STENT INTERVENTION N/A 05/10/2012   Procedure: PERCUTANEOUS STENT INTERVENTION;  Surgeon: Angelia Mould, MD;  Location: [facility] CATH LAB;  Service: Cardiovascular;  Laterality: N/A;  . PERIPHERAL VASCULAR CATHETERIZATION N/A 03/23/2015   Procedure: Abdominal Aortogram;  Surgeon: Angelia Mould, MD;  Location: [place] CV LAB;  Service: Cardiovascular;  Laterality: N/A;  . STENTS     PLACED IN ??BOTH LEGS   2013?     reports that he has quit smoking. He smoked 0.00 packs per day for 0.00 years. He has never used smokeless tobacco. He reports that he drinks alcohol. He reports that he does not use drugs.  Allergies  Allergen Reactions  . Cialis [Tadalafil] Other (See Comments)    Reaction:  Headache     Family History  Problem Relation Age of Onset  . Diabetes Mother   . Hypertension Mother   . Heart disease Mother        Coronary Artery Bypass Graft  . Hyperlipidemia Mother   . Heart attack Mother   . Hypertension Father   . Hyperlipidemia Sister   . Stroke Sister     Prior to Admission medications   Medication Sig Start Date End Date Taking? Authorizing Provider  apixaban (ELIQUIS) 5 MG  TABS tablet Take 1 tablet (5 mg total) by mouth 2 (two) times daily. 10/22/16  Yes Eugenie Filler, MD  Blood Glucose Monitoring Suppl (ACCU-CHEK NANO SMARTVIEW) w/Device KIT by Does not apply route.   Yes [provider]  dextromethorphan-guaiFENesin (MUCINEX DM) 30-600 MG 12hr tablet Take 1 tablet by mouth 2 (two) times daily.   Yes [provider]  diltiazem (CARDIZEM CD) 120 MG 24 hr capsule Take 1 capsule (120 mg total) by mouth daily. 10/23/16  Yes Eugenie Filler, MD  fenofibrate 160 MG tablet Take 160 mg by mouth daily.   Yes [provider]  glimepiride (AMARYL) 4 MG tablet Take 4 mg by mouth daily with breakfast.   Yes [provider]  guaiFENesin (ROBITUSSIN) 100 MG/5ML liquid Take 200 mg by mouth 3 (three) times daily as needed for cough.   Yes [provider]  lidocaine-prilocaine (EMLA) cream Apply 1 application topically as needed. 11/26/16  Yes Curt Bears, MD  metFORMIN (GLUCOPHAGE-XR) 500 MG 24 hr tablet Take 500 mg by mouth 3 (three) times daily. 01/07/17  Yes [provider]  metoprolol succinate (TOPROL-XL) 50 MG 24 hr tablet Take 50 mg by mouth daily. Take with or immediately following a meal.  Yes [provider]  oxyCODONE-acetaminophen (PERCOCET/ROXICET) 5-325 MG tablet Take 1 tablet by mouth every 6 (six) hours as needed for severe pain. 12/25/16  Yes Curt Bears, MD  phenylephrine (SUDAFED PE) 10 MG TABS tablet Take 10 mg by mouth every 4 (four) hours as needed.   Yes [provider]  pioglitazone (ACTOS) 15 MG tablet Take 15 mg by mouth daily.   Yes [provider]  rosuvastatin (CRESTOR) 40 MG tablet Take 40 mg by mouth every evening. For cholesterol   Yes [provider]  zolpidem (AMBIEN) 10 MG tablet Take 10 mg by mouth at bedtime as needed for sleep.  01/26/15  Yes [provider]  metFORMIN (GLUCOPHAGE) 500 MG tablet Take 1 tablet (500 mg total) by mouth 3  (three) times daily. Patient not taking: Reported on 02/02/2017 04/18/14   Cristal Ford, DO  prochlorperazine (COMPAZINE) 10 MG tablet Take 1 tablet (10 mg total) by mouth every 6 (six) hours as needed for nausea or vomiting. Patient not taking: Reported on 11/26/2016 09/18/16   Curt Bears, MD    Physical Exam: Vitals:   02/02/17 1209 02/02/17 1329 02/02/17 1441  BP: 133/73 116/82 115/85  Pulse: (!) 121 (!) 111 (!) 110  Resp:  (!) 24 20  Temp: 98.3 F (36.8 C) 98.3 F (36.8 C)   TempSrc: Oral    SpO2: 98% 94% 93%    Constitutional: NAD, calm, comfortable Eyes: PERRL, lids and conjunctivae normal ENMT: Mucous membranes are moist. Posterior pharynx clear of any exudate or lesions. Neck: Normal, supple, no masses, no thyromegaly Respiratory: Bilateral wheezing, bibasilar rales. Normal respiratory effort. No accessory muscle use.  Cardiovascular: Irregularly irregular, tachycardic at 106 BPM, no murmurs / rubs / gallops. 1+ lower extremity edema. 2+ pedal pulses. No carotid bruits.  Abdomen: Soft, no tenderness, no masses palpated. No hepatosplenomegaly. Bowel sounds positive.  Musculoskeletal: no clubbing / cyanosis. Good ROM, no contractures. Normal muscle tone.  Skin: no rashes, lesions, ulcers  Neurologic: CN 2-12 grossly intact. Sensation intact, DTR normal. Strength 5/5 in all 4.  Psychiatric: Alert and oriented x 3. Normal mood.    Labs on Admission: I have personally reviewed following labs and imaging studies  CBC:  Recent Labs Lab 01/28/17 1217 02/02/17 1309  WBC 5.3 4.6  NEUTROABS 3.9 3.5  HGB 7.3* 7.5*  HCT 22.3* 22.8*  MCV 99.7* 102.2*  PLT 70* 88*   Basic Metabolic Panel:  Recent Labs Lab 01/28/17 1217 02/02/17 1309  NA 140 139  K 3.7 3.5  CL  --  108  CO2 22 24  GLUCOSE 116 125*  BUN 10.3 12  CREATININE 1.1 0.91  CALCIUM 9.3 9.1   GFR: CrCl cannot be calculated (Unknown ideal weight.). Liver Function Tests:  Recent Labs Lab  01/28/17 1217 02/02/17 1309  AST 18 21  ALT 28 24  ALKPHOS 60 50  BILITOT 0.52 0.7  PROT 6.4 6.5  ALBUMIN 3.4* 3.5   No results for input(s): LIPASE, AMYLASE in the last 168 hours. No results for input(s): AMMONIA in the last 168 hours. Coagulation Profile: No results for input(s): INR, PROTIME in the last 168 hours. Cardiac Enzymes: No results for input(s): CKTOTAL, CKMB, CKMBINDEX, TROPONINI in the last 168 hours. BNP (last 3 results) No results for input(s): PROBNP in the last 8760 hours. HbA1C: No results for input(s): HGBA1C in the last 72 hours. CBG: No results for input(s): GLUCAP in the last 168 hours. Lipid Profile: No results for input(s): CHOL,  HDL, LDLCALC, TRIG, CHOLHDL, LDLDIRECT in the last 72 hours. Thyroid Function Tests: No results for input(s): TSH, T4TOTAL, FREET4, T3FREE, THYROIDAB in the last 72 hours. Anemia Panel: No results for input(s): VITAMINB12, FOLATE, FERRITIN, TIBC, IRON, RETICCTPCT in the last 72 hours. Urine analysis:    Component Value Date/Time   COLORURINE YELLOW 05/15/2015 0900   APPEARANCEUR CLEAR 05/15/2015 0900   LABSPEC 1.027 05/15/2015 0900   PHURINE 5.0 05/15/2015 0900   GLUCOSEU 500 (A) 05/15/2015 0900   HGBUR NEGATIVE 05/15/2015 0900   BILIRUBINUR SMALL (A) 05/15/2015 0900   KETONESUR NEGATIVE 05/15/2015 0900   PROTEINUR NEGATIVE 05/15/2015 0900   UROBILINOGEN 0.2 05/15/2015 0900   NITRITE NEGATIVE 05/15/2015 0900   LEUKOCYTESUR NEGATIVE 05/15/2015 0900    Radiological Exams on Admission: Dg Chest 2 View  Result Date: 02/02/2017 CLINICAL DATA:  T shortness of breath over the past several days. History of lung malignancy. Last chemotherapy performed 3 weeks ago. The patient has previously undergone radiation therapy. Former smoker. History of hypertension and diabetes and CVA. EXAM: CHEST  2 VIEW COMPARISON:  CT scan chest of Dec 16, 2016 and chest x-ray of October 21, 2016. FINDINGS: The left perihilar and suprahilar region  exhibits increased density consistent with pneumonia superimposed upon known underlying mass. The left lower lung is clear but there is a small left pleural effusion. The right lung is clear with exception of a small pleural effusion. The cardiac silhouette is enlarged. The pulmonary vascularity is normal. The power port catheter tip projects over the midportion of the SVC. The observed bony thorax exhibits no acute abnormality. IMPRESSION: Increased prominence of left perihilar and suprahilar densities which may reflect pneumonia or atelectasis superimposed upon known underlying mass lesions. New small bilateral pleural effusions greatest on the left. No overt pulmonary vascular congestion. Electronically Signed   By: Abdurahman Rugg  [place] M.D.   On: 02/02/2017 14:05   Echocardiogram 10/22/2016 ------------------------------------------------------------------- LV EF: 55%  ------------------------------------------------------------------- Indications:      Atrial fibrillation - 427.31.  ------------------------------------------------------------------- History:   PMH:  Hx of Stroke.  Chronic obstructive pulmonary disease.  Risk factors:  Hypertension. Diabetes mellitus. Obese. Dyslipidemia.  ------------------------------------------------------------------- Study Conclusions  - Left ventricle: The cavity size was normal. Wall thickness was   normal. The estimated ejection fraction was 55%. Basal inferior   and basal inferoseptal severe hypokinesis. Doppler parameters are   consistent with abnormal left ventricular relaxation (grade 1   diastolic dysfunction). - Aortic valve: There was no stenosis. - Aorta: Borderline dilated aortic root. Aortic root dimension: 36   mm (ED). - Mitral valve: There was no significant regurgitation. - Left atrium: The atrium was mildly dilated. - Right ventricle: The cavity size was normal. Systolic function   was normal. - Pulmonary arteries: No complete TR  doppler jet so unable to   estimate PA systolic pressure. - Inferior vena cava: The vessel was normal in size. The   respirophasic diameter changes were in the normal range (>= 50%),   consistent with normal central venous pressure.  Impressions:  - Normal LV size with EF 50%. Basal inferior and inferoseptal   severe hypokinesis. Normal RV size and systolic function. No   significant valvular abnormalities.   EKG: Independently reviewed. Vent. rate 112 BPM PR interval * ms QRS duration 105 ms QT/QTc 339/463 ms P-R-T axes * 78 207 Atrial fibrillation Multiple ventricular premature complexes Low voltage, precordial leads Consider anterior infarct Borderline repolarization abnormality  Assessment/Plan Principal Problem:   CAP (community acquired pneumonia)  Admit to telemetry/inpatient. Continue supplemental oxygen. Continue bronchodilators. Continue Levaquin 750 mg IVPB every 24 hours. Check a sputum Gram stain, culture and sensitivity. Check strep pneumoniae urinary antigen.  Active Problems:   Acute on chronic diastolic CHF (congestive heart failure) (HCC) Continue supplemental oxygen. Bronchodilators as needed. Fluid restriction Monitor intake and output. Furosemide 40 mg IVP every 12 hours. Monitor renal function and electrolytes.    COPD (chronic obstructive pulmonary disease) (HCC) Continue supplemental oxygen. Bronchodilators as needed    GERD (gastroesophageal reflux disease) Protonix 40 mg by mouth daily.    Essential hypertension, benign  Continue Cardizem 120 mg by mouth daily. Continue metoprolol succinate 50 mg by mouth daily Monitor blood pressure.    Hyperlipidemia Continue fenofibrate 160 mg by mouth daily. Continue Crestor 40 mg by mouth daily. LFTs and fasting lipid monitoring as an outpatient.    Diabetes mellitus, type 2 (HCC) Carbohydrate modified diet. Continue Amaryl 4 mg by mouth daily. Continue Glucophage 1500 mg by mouth  daily. Continue Actos 15 mg by mouth daily. CBG monitoring before meals      Small cell lung carcinoma, left ([place]) Continue scheduled follow-ups per oncology.    Atrial fibrillation (HCC) CHA2DS2-VASc Score of at least 6.  Continue diltiazem and metoprolol for rate control. Continue Eliquis for [facility]. Optimize electrolytes.    DVT prophylaxis: On Eliquis. Code Status: Full code. Family Communication:  Disposition Plan: Admit for IV antibiotics for 2-3 days. Consults called:  Admission status: Inpatient/Telemetry.   Reubin Milan MD [facility] Pager [phone]  If 7PM-7AM, please contact night-coverage [email or web address] Password [facility]  02/02/2017, 3:14 PM

## 2017-02-02 NOTE — Telephone Encounter (Signed)
A lot of SOB x 3 days. Did not want to go to ED over the weekend. Wheezing. Coughing very little phlegm. No fever.  A lot of fatigue. A lot of effort to breath.   6/13 carbo VP (hgb 6.7, platelet 100) 6/14 VP PRBC 6/15  VP, PRBC 6/16 neulasta  Labs 7/3 and CT 7/6. Lab/MD 7/10  S/w Dr Julien Nordmann and called pt back. Sent to ED. Called wife back and she will take him.

## 2017-02-02 NOTE — ED Notes (Signed)
PT request for labs to come from port

## 2017-02-02 NOTE — ED Notes (Signed)
Hospitalist at bedside 

## 2017-02-03 ENCOUNTER — Other Ambulatory Visit: Payer: BLUE CROSS/BLUE SHIELD

## 2017-02-03 DIAGNOSIS — I5033 Acute on chronic diastolic (congestive) heart failure: Secondary | ICD-10-CM

## 2017-02-03 DIAGNOSIS — D500 Iron deficiency anemia secondary to blood loss (chronic): Secondary | ICD-10-CM

## 2017-02-03 LAB — CBC WITH DIFFERENTIAL/PLATELET
Basophils Absolute: 0 10*3/uL (ref 0.0–0.1)
Basophils Relative: 0 %
EOS PCT: 1 %
Eosinophils Absolute: 0 10*3/uL (ref 0.0–0.7)
HCT: 19.6 % — ABNORMAL LOW (ref 39.0–52.0)
Hemoglobin: 6.4 g/dL — CL (ref 13.0–17.0)
Lymphocytes Relative: 15 %
Lymphs Abs: 0.6 10*3/uL — ABNORMAL LOW (ref 0.7–4.0)
MCH: 33.2 pg (ref 26.0–34.0)
MCHC: 32.7 g/dL (ref 30.0–36.0)
MCV: 101.6 fL — AB (ref 78.0–100.0)
MONO ABS: 0.6 10*3/uL (ref 0.1–1.0)
Monocytes Relative: 14 %
NEUTROS PCT: 70 %
Neutro Abs: 2.8 10*3/uL (ref 1.7–7.7)
PLATELETS: 91 10*3/uL — AB (ref 150–400)
RBC: 1.93 MIL/uL — AB (ref 4.22–5.81)
RDW: 23.5 % — ABNORMAL HIGH (ref 11.5–15.5)
WBC: 4 10*3/uL (ref 4.0–10.5)

## 2017-02-03 LAB — BASIC METABOLIC PANEL
ANION GAP: 6 (ref 5–15)
BUN: 10 mg/dL (ref 6–20)
CO2: 26 mmol/L (ref 22–32)
Calcium: 8.6 mg/dL — ABNORMAL LOW (ref 8.9–10.3)
Chloride: 107 mmol/L (ref 101–111)
Creatinine, Ser: 0.99 mg/dL (ref 0.61–1.24)
GLUCOSE: 95 mg/dL (ref 65–99)
Potassium: 3.5 mmol/L (ref 3.5–5.1)
SODIUM: 139 mmol/L (ref 135–145)

## 2017-02-03 LAB — PREPARE RBC (CROSSMATCH)

## 2017-02-03 LAB — STREP PNEUMONIAE URINARY ANTIGEN: STREP PNEUMO URINARY ANTIGEN: NEGATIVE

## 2017-02-03 MED ORDER — IPRATROPIUM BROMIDE 0.02 % IN SOLN
0.5000 mg | Freq: Two times a day (BID) | RESPIRATORY_TRACT | Status: DC
  Administered 2017-02-03 – 2017-02-05 (×4): 0.5 mg via RESPIRATORY_TRACT
  Filled 2017-02-03 (×4): qty 2.5

## 2017-02-03 MED ORDER — LORATADINE 10 MG PO TABS
10.0000 mg | ORAL_TABLET | Freq: Once | ORAL | Status: AC
  Administered 2017-02-03: 10 mg via ORAL
  Filled 2017-02-03: qty 1

## 2017-02-03 MED ORDER — DILTIAZEM HCL 25 MG/5ML IV SOLN
10.0000 mg | Freq: Four times a day (QID) | INTRAVENOUS | Status: DC | PRN
  Administered 2017-02-05 (×2): 10 mg via INTRAVENOUS
  Filled 2017-02-03 (×3): qty 5

## 2017-02-03 MED ORDER — LEVALBUTEROL HCL 0.63 MG/3ML IN NEBU
0.6300 mg | INHALATION_SOLUTION | Freq: Two times a day (BID) | RESPIRATORY_TRACT | Status: DC
  Administered 2017-02-03 – 2017-02-05 (×4): 0.63 mg via RESPIRATORY_TRACT
  Filled 2017-02-03 (×4): qty 3

## 2017-02-03 MED ORDER — SODIUM CHLORIDE 0.9 % IV SOLN: Freq: Once | INTRAVENOUS | Status: DC

## 2017-02-03 MED ORDER — SODIUM CHLORIDE 0.9% FLUSH
10.0000 mL | INTRAVENOUS | Status: DC | PRN
  Administered 2017-02-04: 10 mL
  Filled 2017-02-03: qty 40

## 2017-02-03 NOTE — Progress Notes (Signed)
[facility] PROGRESS NOTE  Jesse Avila:[id number] DOB: Oct 19, 1950 DOA: 02/02/2017   PCP: [name], MD     Assessment/Plan: Principal Problem:   CAP (community acquired pneumonia) Active Problems:   GERD (gastroesophageal reflux disease)   Essential hypertension, benign   Hyperlipidemia   Diabetes mellitus, type 2 (HCC)   COPD (chronic obstructive pulmonary disease) ([place])   Small cell lung carcinoma, left (HCC)   Atrial fibrillation (HCC)   Anemia   Acute on chronic diastolic CHF (congestive heart failure) ([place])   66 y.o. male with medical history significant of atrial fibrillation, coronary artery occlusion, osteoarthritis of the knees, essential hypertension, colon polyps, gout, hyperlipidemia, insomnia, peripheral vascular disease, history of CVA, type 2 diabetes, history of lung cancer history of radiotherapy, last chemotherapy about 3 weeks ago who is coming to the emergency department with complaints of progressively worse dyspnea for the past 3 days associated with nonproductive cough, orthopnea and lower extremity edema. Patient found to be in atrial fibrillation with rapid ventricular response, chest x-ray suspicious for pneumonia  Assessment and plan  CAP (community acquired pneumonia) Continue telemetry, which shows atrial fibrillation with rapid ventricular response Currently on 4 L by nasal cannula, baseline is 2 L Continue bronchodilators. Continue Levaquin 750 mg IVPB every 24 hours. Follow sputum Gram stain, culture and sensitivity. Negative strep pneumoniae urinary antigen.      chronic diastolic CHF (congestive heart failure) ([place]), doubt exacerbation Continue supplemental oxygen. Bronchodilators as needed. Discontinue Lasix and monitor      COPD (chronic obstructive pulmonary disease) (HCC) Continue supplemental oxygen. Bronchodilators as needed    GERD (gastroesophageal reflux disease) Protonix 40 mg by mouth daily.     Essential hypertension, benign  Continue Cardizem 120 mg by mouth daily. Continue metoprolol succinate 50 mg by mouth daily      Hyperlipidemia Continue fenofibrate 160 mg by mouth daily. Continue Crestor 40 mg by mouth daily. LFTs and fasting lipid monitoring as an outpatient.    Diabetes mellitus, type 2 (HCC) Carbohydrate modified diet. Continue Amaryl 4 mg by mouth daily Hold metformin Continue  Actos 15 mg by mouth daily. CBG monitoring before meals   Anemia-patient is status post receiving chemotherapy 3 weeks ago. According to the patient is drop in his hemoglobin was expected. We'll transfuse him 1 unit of packed red blood cells and repeat CBC tomorrow       Small cell lung carcinoma, left (HCC) Continue scheduled follow-ups per oncology. Wife states patient is scheduled for Chest CT scan this week    Atrial fibrillation (HCC) CHA2DS2-VASc Score of at least 6.  Continue diltiazem and metoprolol for rate control. Continue Eliquis for [facility]. Optimize electrolytes. Prn Cardizem for heart rate greater than 110 Transfuse  DVT prophylaxsis eliquis  Code Status:  Full code     Family Communication: Discussed in detail with the patient, all imaging results, lab results explained to the patient   Disposition Plan: 2-3 days      Consultants:  None  Procedures:  None  Antibiotics: Anti-infectives    Start     Dose/Rate Route Frequency Ordered Stop   02/03/17 1600  levofloxacin (LEVAQUIN) IVPB 750 mg     750 mg 100 mL/hr over 90 Minutes Intravenous Every 24 hours 02/02/17 1756 02/08/17 1559   02/02/17 1515  levofloxacin (LEVAQUIN) IVPB 750 mg     750 mg 100 mL/hr over 90 Minutes Intravenous  Once 02/02/17 1501 02/02/17 1742  HPI/Subjective: Heart rate continues to be in the 120s at rest, patient desaturates with minimal activity  Objective: Vitals:   02/02/17 2031 02/02/17 2328 02/03/17 0555 02/03/17 0835  BP:  102/63 102/68   Pulse:  (!)  107 (!) 125   Resp:  18 17   Temp:  99.1 F (37.3 C) 98.4 F (36.9 C)   TempSrc:  Oral Oral   SpO2: 95% 99% 94% 91%  Weight:   102 kg (224 lb 13.9 oz)     Intake/Output Summary (Last 24 hours) at 02/03/17 0858 Last data filed at 02/02/17 2155  Gross per 24 hour  Intake                0 ml  Output             1325 ml  Net            -1325 ml    Exam:  Examination:  General exam: Appears calm and comfortable  Respiratory systemSomewhat increasedry effort   Cardiovascular system: S1 & S2 heard, RRR. No JVD, murmurs, rubs, gallops or clicks. No pedal edema. Gastrointestinal system: Abdomen is nondistended, soft and nontender. No organomegaly or masses felt. Normal bowel sounds heard. Central nervous system: Alert and oriented. No focal neurological deficits. Extremities: Symmetric 5 x 5 power. Skin: No rashes, lesions or ulcers Psychiatry: Judgement and insight appear normal. Mood & affect appropriate.     Data Reviewed: I have personally reviewed following labs and imaging studies  Micro Results No results found for this or any previous visit (from the past 240 hour(s)).  Radiology Reports Dg Chest 2 View  Result Date: 02/02/2017 CLINICAL DATA:  T shortness of breath over the past several days. History of lung malignancy. Last chemotherapy performed 3 weeks ago. The patient has previously undergone radiation therapy. Former smoker. History of hypertension and diabetes and CVA. EXAM: CHEST  2 VIEW COMPARISON:  CT scan chest of Dec 16, 2016 and chest x-ray of October 21, 2016. FINDINGS: The left perihilar and suprahilar region exhibits increased density consistent with pneumonia superimposed upon known underlying mass. The left lower lung is clear but there is a small left pleural effusion. The right lung is clear with exception of a small pleural effusion. The cardiac silhouette is enlarged. The pulmonary vascularity is normal. The power port catheter tip projects over the  midportion of the SVC. The observed bony thorax exhibits no acute abnormality. IMPRESSION: Increased prominence of left perihilar and suprahilar densities which may reflect pneumonia or atelectasis superimposed upon known underlying mass lesions. New small bilateral pleural effusions greatest on the left. No overt pulmonary vascular congestion. Electronically Signed   By: David  Martinique M.D.   On: 02/02/2017 14:05     CBC  Recent Labs Lab 01/28/17 1217 02/02/17 1309 02/03/17 0545  WBC 5.3 4.6 4.0  HGB 7.3* 7.5* 6.4*  HCT 22.3* 22.8* 19.6*  PLT 70* 88* 91*  MCV 99.7* 102.2* 101.6*  MCH 32.7 33.6 33.2  MCHC 32.8 32.9 32.7  RDW 23.4* 23.3* 23.5*  LYMPHSABS 0.8* 0.7 0.6*  MONOABS 0.5 0.4 0.6  EOSABS 0.0 0.0 0.0  BASOSABS 0.0 0.0 0.0    Chemistries   Recent Labs Lab 01/28/17 1217 02/02/17 1309 02/03/17 0545  NA 140 139 139  K 3.7 3.5 3.5  CL  --  108 107  CO2 22 24 26   GLUCOSE 116 125* 95  BUN 10.3 12 10   CREATININE 1.1 0.91 0.99  CALCIUM 9.3 9.1  8.6*  MG  --  1.8  --   AST 18 21  --   ALT 28 24  --   ALKPHOS 60 50  --   BILITOT 0.52 0.7  --    ------------------------------------------------------------------------------------------------------------------ estimated creatinine clearance is 97.3 mL/min (by C-G formula based on SCr of 0.99 mg/dL). ------------------------------------------------------------------------------------------------------------------ No results for input(s): HGBA1C in the last 72 hours. ------------------------------------------------------------------------------------------------------------------ No results for input(s): CHOL, HDL, LDLCALC, TRIG, CHOLHDL, LDLDIRECT in the last 72 hours. ------------------------------------------------------------------------------------------------------------------ No results for input(s): TSH, T4TOTAL, T3FREE, THYROIDAB in the last 72 hours.  Invalid input(s):  FREET3 ------------------------------------------------------------------------------------------------------------------ No results for input(s): VITAMINB12, FOLATE, FERRITIN, TIBC, IRON, RETICCTPCT in the last 72 hours.  Coagulation profile No results for input(s): INR, PROTIME in the last 168 hours.  No results for input(s): DDIMER in the last 72 hours.  Cardiac Enzymes No results for input(s): CKMB, TROPONINI, MYOGLOBIN in the last 168 hours.  Invalid input(s): CK ------------------------------------------------------------------------------------------------------------------ Invalid input(s): POCBNP   CBG: No results for input(s): GLUCAP in the last 168 hours.     Studies: Dg Chest 2 View  Result Date: 02/02/2017 CLINICAL DATA:  T shortness of breath over the past several days. History of lung malignancy. Last chemotherapy performed 3 weeks ago. The patient has previously undergone radiation therapy. Former smoker. History of hypertension and diabetes and CVA. EXAM: CHEST  2 VIEW COMPARISON:  CT scan chest of Dec 16, 2016 and chest x-ray of October 21, 2016. FINDINGS: The left perihilar and suprahilar region exhibits increased density consistent with pneumonia superimposed upon known underlying mass. The left lower lung is clear but there is a small left pleural effusion. The right lung is clear with exception of a small pleural effusion. The cardiac silhouette is enlarged. The pulmonary vascularity is normal. The power port catheter tip projects over the midportion of the SVC. The observed bony thorax exhibits no acute abnormality. IMPRESSION: Increased prominence of left perihilar and suprahilar densities which may reflect pneumonia or atelectasis superimposed upon known underlying mass lesions. New small bilateral pleural effusions greatest on the left. No overt pulmonary vascular congestion. Electronically Signed   By: David  Martinique M.D.   On: 02/02/2017 14:05      Lab Results   Component Value Date   HGBA1C 8.5 (H) 10/22/2016   HGBA1C 8.4 (H) 10/21/2016   HGBA1C 7.4 (H) 05/18/2015   Lab Results  Component Value Date   LDLCALC 53 10/22/2016   CREATININE 0.99 02/03/2017       Scheduled Meds: . apixaban  5 mg Oral BID  . dextromethorphan-guaiFENesin  1 tablet Oral BID  . diltiazem  120 mg Oral Daily  . fenofibrate  160 mg Oral Daily  . glimepiride  4 mg Oral Q breakfast  . ipratropium  0.5 mg Nebulization TID  . levalbuterol  0.63 mg Nebulization TID  . metFORMIN  1,500 mg Oral Q breakfast  . metoprolol succinate  50 mg Oral Daily  . pioglitazone  15 mg Oral Daily  . rosuvastatin  40 mg Oral QPM   Continuous Infusions: . sodium chloride    . levofloxacin (LEVAQUIN) IV       LOS: 1 day    Time spent: >30 MINS    Reyne Dumas  [facility] Pager [phone]. If 7PM-7AM, please contact night-coverage at [email or web address], password [facility] 02/03/2017, 8:58 AM  LOS: 1 day

## 2017-02-03 NOTE — Progress Notes (Signed)
CRITICAL VALUE ALERT  Critical Value:  Hgb 6.4   Date & Time Notied:  7/3    0624  Provider Notified: K.Schorr,NP  Orders Received/Actions taken: awaiting return page

## 2017-02-04 DIAGNOSIS — J181 Lobar pneumonia, unspecified organism: Secondary | ICD-10-CM

## 2017-02-04 LAB — COMPREHENSIVE METABOLIC PANEL
ALBUMIN: 3 g/dL — AB (ref 3.5–5.0)
ALT: 17 U/L (ref 17–63)
AST: 16 U/L (ref 15–41)
Alkaline Phosphatase: 45 U/L (ref 38–126)
Anion gap: 8 (ref 5–15)
BILIRUBIN TOTAL: 0.7 mg/dL (ref 0.3–1.2)
BUN: 13 mg/dL (ref 6–20)
CO2: 24 mmol/L (ref 22–32)
Calcium: 8.5 mg/dL — ABNORMAL LOW (ref 8.9–10.3)
Chloride: 108 mmol/L (ref 101–111)
Creatinine, Ser: 1.01 mg/dL (ref 0.61–1.24)
GFR calc Af Amer: >60 mL/min (ref >60)
GFR calc non Af Amer: >60 mL/min (ref >60)
GLUCOSE: 71 mg/dL (ref 65–99)
POTASSIUM: 3.5 mmol/L (ref 3.5–5.1)
SODIUM: 140 mmol/L (ref 135–145)
TOTAL PROTEIN: 5.9 g/dL — AB (ref 6.5–8.1)

## 2017-02-04 LAB — CBC WITH DIFFERENTIAL/PLATELET
BASOS ABS: 0 10*3/uL (ref 0.0–0.1)
Basophils Relative: 0 %
Eosinophils Absolute: 0.1 10*3/uL (ref 0.0–0.7)
Eosinophils Relative: 1 %
HEMATOCRIT: 21.5 % — AB (ref 39.0–52.0)
HEMOGLOBIN: 7.1 g/dL — AB (ref 13.0–17.0)
LYMPHS PCT: 14 %
Lymphs Abs: 0.7 10*3/uL (ref 0.7–4.0)
MCH: 33 pg (ref 26.0–34.0)
MCHC: 33 g/dL (ref 30.0–36.0)
MCV: 100 fL (ref 78.0–100.0)
MONOS PCT: 11 %
Monocytes Absolute: 0.6 10*3/uL (ref 0.1–1.0)
Neutro Abs: 3.8 10*3/uL (ref 1.7–7.7)
Neutrophils Relative %: 74 %
Platelets: 99 10*3/uL — ABNORMAL LOW (ref 150–400)
RBC: 2.15 MIL/uL — AB (ref 4.22–5.81)
RDW: 24.1 % — AB (ref 11.5–15.5)
WBC: 5.2 10*3/uL (ref 4.0–10.5)

## 2017-02-04 LAB — PREPARE RBC (CROSSMATCH)

## 2017-02-04 LAB — GLUCOSE, CAPILLARY: Glucose-Capillary: 93 mg/dL (ref 65–99)

## 2017-02-04 MED ORDER — METOPROLOL TARTRATE 25 MG PO TABS
12.5000 mg | ORAL_TABLET | Freq: Once | ORAL | Status: AC
  Administered 2017-02-04: 12.5 mg via ORAL
  Filled 2017-02-04: qty 1

## 2017-02-04 MED ORDER — SODIUM CHLORIDE 0.9 % IV SOLN
Freq: Once | INTRAVENOUS | Status: AC
  Administered 2017-02-04: 17:00:00 via INTRAVENOUS

## 2017-02-04 MED ORDER — FUROSEMIDE 10 MG/ML IJ SOLN
20.0000 mg | Freq: Once | INTRAMUSCULAR | Status: AC
  Administered 2017-02-04: 20 mg via INTRAVENOUS
  Filled 2017-02-04: qty 2

## 2017-02-04 MED ORDER — ACETAMINOPHEN 325 MG PO TABS
650.0000 mg | ORAL_TABLET | Freq: Once | ORAL | Status: AC
  Administered 2017-02-04: 650 mg via ORAL
  Filled 2017-02-04: qty 2

## 2017-02-04 MED ORDER — DIPHENHYDRAMINE HCL 25 MG PO CAPS
25.0000 mg | ORAL_CAPSULE | Freq: Once | ORAL | Status: AC
  Administered 2017-02-04: 25 mg via ORAL
  Filled 2017-02-04: qty 1

## 2017-02-04 NOTE — Progress Notes (Signed)
[facility] PROGRESS NOTE  Jesse Avila SFK:[id number] DOB: Aug 08, 1950 DOA: 02/02/2017   PCP: [name], MD     Assessment/Plan: Principal Problem:   CAP (community acquired pneumonia) Active Problems:   GERD (gastroesophageal reflux disease)   Essential hypertension, benign   Hyperlipidemia   Diabetes mellitus, type 2 (HCC)   COPD (chronic obstructive pulmonary disease) ([place])   Small cell lung carcinoma, left (HCC)   Atrial fibrillation (HCC)   Anemia   Acute on chronic diastolic CHF (congestive heart failure) ([place])   44 ? permanent atrial fibrillation, coronary artery occlusion,  osteoarthritis of the knees,  essential hypertension,  colon polyps gout,  hyperlipidemia insomnia peripheral vascular disease, history of CVA,  type 2 diabetes,  history of lung cancer history of radiotherapy, last chemotherapy about 3 weeks ago   Admitted 7 2 2018 with rapid atrial fibrillation in a setting of new onset community-acquired pneumonia  Assessment and plan  CAP (community acquired pneumonia) COPD (chronic obstructive pulmonary disease) (HCC)-not on home O2 at baseline Stage T4 N2 M1 B Small cell lung carcinoma, left ([place])--  Complicated by large left upper lobe mass and mediastinal invasion + pancreatic lesion + left  iliac metastases  folows Dr. Essie Hart will be cc on note  Continue scheduled follow-ups per oncology.  Wife states patient is scheduled for Chest CT scan this week  Note that dosage reduction of AUC done on 6/13, transfuse 2 units in the past month as an outpatient  We will ask Dr. Earlie Server to discuss palliative care with the patient Bronchodilators as needed  CXR on admit-atelectasis, vs masses + Effusions Continue telemetry, which shows atrial fibrillation with rapid ventricular response Continue bronchodilators. Continue Levaquin 750 mg IVPB every 24 hours-quick transition to po ? 7/5--im not sure if this is actual PNA--NO sputum, no  fever, no suual iondices to suggest active process Follow sputum Gram stain, culture and sensitivity. Negative strep pneumoniae urinary antigen.    chronic diastolic CHF (congestive heart failure) ([place]), doubt exacerbation   Atrial fibrillation (HCC)--heart rate still in ! 130's   Essential hypertension, benign  CHA2DS2-VASc Score of at least 6.  Continue diltiazem 120 cd  and metoprolol xl 50 for rate control.   Added Toprol 12.5 --02/03/17  Monitor trend of HR Continue Eliquis for AC. Optimize electrolytes. Prn Cardizem for heart rate greater than 110 Transfuse Continue supplemental oxygen. Bronchodilators as needed. Discontinue Lasix and monitor   GERD (gastroesophageal reflux disease)  Protonix 40 mg by mouth daily  Hyperlipidemia D/c fenofibrate 160 mg d/c Crestor 40 mg by mouth daily. LFTs and fasting lipid monitoring as an outpatient.    Diabetes mellitus, type 2 (HCC) Carbohydrate modified diet. D/c am Amaryl 4 mg by mouth daily [low cbg 7/4 am] Can Continue  Actos 15 mg by mouth daily. Hold metformin CBG monitoring before meals CBG range 71-93  TCP/Anemia, macrocytic Ovalocytes and tear shaped cells 5/23--?Fe def?  patient is status post receiving chemotherapy 3 weeks ago.  On admission hemoglobin found to be 6.4   S/P 1 unit PRBC 7/3 hemoglobin 7.1 currently  Aiming for hemoglobin of 8 we will repeat transfusion, 1 unit PRBC 7/4  Hemoccult all stool--get iron and other studies with next lab draw      Patient stabilizing Need input Dr. Earlie Server re: goals transfuse today Not ready yet for d/c DVT prophylaxsis eliquis  Code Status:  Full code     Family Communication: Discussed in detail with the  patient, all imaging results, lab results explained to the patient   Disposition Plan: 2-3 days   Consultants:  None  Procedures:  None  Antibiotics: Anti-infectives    Start     Dose/Rate Route Frequency Ordered Stop   02/03/17 1600  levofloxacin  (LEVAQUIN) IVPB 750 mg     750 mg 100 mL/hr over 90 Minutes Intravenous Every 24 hours 02/02/17 1756 02/08/17 1559   02/02/17 1515  levofloxacin (LEVAQUIN) IVPB 750 mg     750 mg 100 mL/hr over 90 Minutes Intravenous  Once 02/02/17 1501 02/02/17 1742      HPI/Subjective:  Alert oriented in nad Sitting up at bedside Wife present Eating and drinking Wondering why he is still in hospital had fever chills n/v/cp    Objective: Vitals:   02/03/17 2154 02/03/17 2231 02/04/17 0659 02/04/17 0809  BP: 115/73  119/72   Pulse: (!) 121  (!) 125   Resp: 16  17   Temp: 97.7 F (36.5 C)  98.9 F (37.2 C)   TempSrc: Oral  Oral   SpO2: 98%  98% 94%  Weight:   103 kg (227 lb 1.2 oz)   Height:  6\' 1"  (1.854 m)      Intake/Output Summary (Last 24 hours) at 02/04/17 1119 Last data filed at 02/04/17 0526  Gross per 24 hour  Intake             1045 ml  Output             2170 ml  Net            -1125 ml    Exam:  Examination:  eomi ncat No ict No pallor Chest clear no added sound s1 s2 tachy 100's at least, tele sinus tach + afib Neuro intact, MSK intact    Data Reviewed: I have personally reviewed following labs and imaging studies  Micro Results No results found for this or any previous visit (from the past 240 hour(s)).  Radiology Reports Dg Chest 2 View  Result Date: 02/02/2017 CLINICAL DATA:  T shortness of breath over the past several days. History of lung malignancy. Last chemotherapy performed 3 weeks ago. The patient has previously undergone radiation therapy. Former smoker. History of hypertension and diabetes and CVA. EXAM: CHEST  2 VIEW COMPARISON:  CT scan chest of Dec 16, 2016 and chest x-ray of October 21, 2016. FINDINGS: The left perihilar and suprahilar region exhibits increased density consistent with pneumonia superimposed upon known underlying mass. The left lower lung is clear but there is a small left pleural effusion. The right lung is clear with exception  of a small pleural effusion. The cardiac silhouette is enlarged. The pulmonary vascularity is normal. The power port catheter tip projects over the midportion of the SVC. The observed bony thorax exhibits no acute abnormality. IMPRESSION: Increased prominence of left perihilar and suprahilar densities which may reflect pneumonia or atelectasis superimposed upon known underlying mass lesions. New small bilateral pleural effusions greatest on the left. No overt pulmonary vascular congestion. Electronically Signed   By: David  Martinique M.D.   On: 02/02/2017 14:05     CBC  Recent Labs Lab 01/28/17 1217 02/02/17 1309 02/03/17 0545 02/04/17 0523  WBC 5.3 4.6 4.0 5.2  HGB 7.3* 7.5* 6.4* 7.1*  HCT 22.3* 22.8* 19.6* 21.5*  PLT 70* 88* 91* 99*  MCV 99.7* 102.2* 101.6* 100.0  MCH 32.7 33.6 33.2 33.0  MCHC 32.8 32.9 32.7 33.0  RDW 23.4* 23.3* 23.5*  24.1*  LYMPHSABS 0.8* 0.7 0.6* 0.7  MONOABS 0.5 0.4 0.6 0.6  EOSABS 0.0 0.0 0.0 0.1  BASOSABS 0.0 0.0 0.0 0.0    Chemistries   Recent Labs Lab 01/28/17 1217 02/02/17 1309 02/03/17 0545 02/04/17 0523  NA 140 139 139 140  K 3.7 3.5 3.5 3.5  CL  --  108 107 108  CO2 22 24 26 24   GLUCOSE 116 125* 95 71  BUN 10.3 12 10 13   CREATININE 1.1 0.91 0.99 1.01  CALCIUM 9.3 9.1 8.6* 8.5*  MG  --  1.8  --   --   AST 18 21  --  16  ALT 28 24  --  17  ALKPHOS 60 50  --  45  BILITOT 0.52 0.7  --  0.7   ------------------------------------------------------------------------------------------------------------------ estimated creatinine clearance is 94.3 mL/min (by C-G formula based on SCr of 1.01 mg/dL). ------------------------------------------------------------------------------------------------------------------ No results for input(s): HGBA1C in the last 72 hours. ------------------------------------------------------------------------------------------------------------------ No results for input(s): CHOL, HDL, LDLCALC, TRIG, CHOLHDL, LDLDIRECT  in the last 72 hours. ------------------------------------------------------------------------------------------------------------------ No results for input(s): TSH, T4TOTAL, T3FREE, THYROIDAB in the last 72 hours.  Invalid input(s): FREET3 ------------------------------------------------------------------------------------------------------------------ No results for input(s): VITAMINB12, FOLATE, FERRITIN, TIBC, IRON, RETICCTPCT in the last 72 hours.  Coagulation profile No results for input(s): INR, PROTIME in the last 168 hours.  No results for input(s): DDIMER in the last 72 hours.  Cardiac Enzymes No results for input(s): CKMB, TROPONINI, MYOGLOBIN in the last 168 hours.  Invalid input(s): CK ------------------------------------------------------------------------------------------------------------------ Invalid input(s): POCBNP   CBG:  Recent Labs Lab 02/04/17 0835  GLUCAP 93       Studies: Dg Chest 2 View  Result Date: 02/02/2017 CLINICAL DATA:  T shortness of breath over the past several days. History of lung malignancy. Last chemotherapy performed 3 weeks ago. The patient has previously undergone radiation therapy. Former smoker. History of hypertension and diabetes and CVA. EXAM: CHEST  2 VIEW COMPARISON:  CT scan chest of Dec 16, 2016 and chest x-ray of October 21, 2016. FINDINGS: The left perihilar and suprahilar region exhibits increased density consistent with pneumonia superimposed upon known underlying mass. The left lower lung is clear but there is a small left pleural effusion. The right lung is clear with exception of a small pleural effusion. The cardiac silhouette is enlarged. The pulmonary vascularity is normal. The power port catheter tip projects over the midportion of the SVC. The observed bony thorax exhibits no acute abnormality. IMPRESSION: Increased prominence of left perihilar and suprahilar densities which may reflect pneumonia or atelectasis  superimposed upon known underlying mass lesions. New small bilateral pleural effusions greatest on the left. No overt pulmonary vascular congestion. Electronically Signed   By: David  Martinique M.D.   On: 02/02/2017 14:05      Lab Results  Component Value Date   HGBA1C 8.5 (H) 10/22/2016   HGBA1C 8.4 (H) 10/21/2016   HGBA1C 7.4 (H) 05/18/2015   Lab Results  Component Value Date   LDLCALC 53 10/22/2016   CREATININE 1.01 02/04/2017       Scheduled Meds: . acetaminophen  650 mg Oral Once  . apixaban  5 mg Oral BID  . dextromethorphan-guaiFENesin  1 tablet Oral BID  . diltiazem  120 mg Oral Daily  . diphenhydrAMINE  25 mg Oral Once  . furosemide  20 mg Intravenous Once  . ipratropium  0.5 mg Nebulization BID  . levalbuterol  0.63 mg Nebulization BID  . metoprolol succinate  50 mg Oral  Daily  . pioglitazone  15 mg Oral Daily   Continuous Infusions: . sodium chloride    . sodium chloride Stopped (02/03/17 1105)  . levofloxacin (LEVAQUIN) IV 750 mg (02/03/17 1600)     LOS: 2 days    Verneita Griffes, MD [facility] [phone]

## 2017-02-04 NOTE — Progress Notes (Signed)
Pt heart rate 121 after Metoprolol 12.5 mg given. Sat remain stable on 1 L 97 %/ will cont to monitor. SRP, RN

## 2017-02-05 LAB — COMPREHENSIVE METABOLIC PANEL
ALK PHOS: 49 U/L (ref 38–126)
ALT: 15 U/L — AB (ref 17–63)
AST: 15 U/L (ref 15–41)
Albumin: 3.2 g/dL — ABNORMAL LOW (ref 3.5–5.0)
Anion gap: 8 (ref 5–15)
BUN: 15 mg/dL (ref 6–20)
CALCIUM: 8.8 mg/dL — AB (ref 8.9–10.3)
CO2: 25 mmol/L (ref 22–32)
CREATININE: 1.04 mg/dL (ref 0.61–1.24)
Chloride: 104 mmol/L (ref 101–111)
GFR calc non Af Amer: >60 mL/min (ref >60)
Glucose, Bld: 115 mg/dL — ABNORMAL HIGH (ref 65–99)
Potassium: 3.5 mmol/L (ref 3.5–5.1)
SODIUM: 137 mmol/L (ref 135–145)
Total Bilirubin: 0.6 mg/dL (ref 0.3–1.2)
Total Protein: 6.3 g/dL — ABNORMAL LOW (ref 6.5–8.1)

## 2017-02-05 LAB — RETICULOCYTES
RBC.: 2.45 MIL/uL — ABNORMAL LOW (ref 4.22–5.81)
Retic Count, Absolute: 85.8 10*3/uL (ref 19.0–186.0)
Retic Ct Pct: 3.5 % — ABNORMAL HIGH (ref 0.4–3.1)

## 2017-02-05 LAB — IRON AND TIBC
Iron: 78 ug/dL (ref 45–182)
SATURATION RATIOS: 21 % (ref 17.9–39.5)
TIBC: 379 ug/dL (ref 250–450)
UIBC: 301 ug/dL

## 2017-02-05 LAB — CBC WITH DIFFERENTIAL/PLATELET
Basophils Absolute: 0 10*3/uL (ref 0.0–0.1)
Basophils Relative: 0 %
Eosinophils Absolute: 0.1 10*3/uL (ref 0.0–0.7)
Eosinophils Relative: 1 %
HCT: 24 % — ABNORMAL LOW (ref 39.0–52.0)
HEMOGLOBIN: 8.1 g/dL — AB (ref 13.0–17.0)
LYMPHS ABS: 0.7 10*3/uL (ref 0.7–4.0)
LYMPHS PCT: 14 %
MCH: 33.1 pg (ref 26.0–34.0)
MCHC: 33.8 g/dL (ref 30.0–36.0)
MCV: 98 fL (ref 78.0–100.0)
Monocytes Absolute: 0.6 10*3/uL (ref 0.1–1.0)
Monocytes Relative: 11 %
NEUTROS PCT: 74 %
Neutro Abs: 3.8 10*3/uL (ref 1.7–7.7)
Platelets: 109 10*3/uL — ABNORMAL LOW (ref 150–400)
RBC: 2.45 MIL/uL — AB (ref 4.22–5.81)
RDW: 22.9 % — ABNORMAL HIGH (ref 11.5–15.5)
WBC: 5.1 10*3/uL (ref 4.0–10.5)

## 2017-02-05 LAB — PROTIME-INR
INR: 1.67
Prothrombin Time: 19.9 seconds — ABNORMAL HIGH (ref 11.4–15.2)

## 2017-02-05 LAB — FOLATE: FOLATE: 13.1 ng/mL (ref >5.9)

## 2017-02-05 LAB — VITAMIN B12: VITAMIN B 12: 836 pg/mL (ref 180–914)

## 2017-02-05 LAB — MAGNESIUM: Magnesium: 1.8 mg/dL (ref 1.7–2.4)

## 2017-02-05 LAB — FERRITIN: Ferritin: 616 ng/mL — ABNORMAL HIGH (ref 24–336)

## 2017-02-05 MED ORDER — METOPROLOL TARTRATE 100 MG PO TABS: 100.0000 mg | ORAL_TABLET | Freq: Two times a day (BID) | ORAL | 11 refills | Status: DC

## 2017-02-05 MED ORDER — LEVOFLOXACIN 750 MG PO TABS: 750.0000 mg | ORAL_TABLET | Freq: Every day | ORAL | Status: DC

## 2017-02-05 MED ORDER — POTASSIUM CHLORIDE CRYS ER 20 MEQ PO TBCR
30.0000 meq | EXTENDED_RELEASE_TABLET | Freq: Once | ORAL | Status: AC
  Administered 2017-02-05: 15:00:00 30 meq via ORAL
  Filled 2017-02-05: qty 1

## 2017-02-05 MED ORDER — MAGNESIUM OXIDE 400 (241.3 MG) MG PO TABS: 400.0000 mg | ORAL_TABLET | Freq: Two times a day (BID) | ORAL | 0 refills | Status: DC

## 2017-02-05 MED ORDER — MAGNESIUM OXIDE 400 (241.3 MG) MG PO TABS
400.0000 mg | ORAL_TABLET | Freq: Two times a day (BID) | ORAL | Status: DC
  Administered 2017-02-05: 400 mg via ORAL
  Filled 2017-02-05: qty 1

## 2017-02-05 MED ORDER — HEPARIN SOD (PORK) LOCK FLUSH 100 UNIT/ML IV SOLN: 250.0000 [IU] | INTRAVENOUS | Status: DC | PRN

## 2017-02-05 MED ORDER — METOPROLOL TARTRATE 75 MG PO TABS: 75.0000 mg | ORAL_TABLET | Freq: Two times a day (BID) | ORAL | 11 refills | Status: DC

## 2017-02-05 MED ORDER — METOPROLOL SUCCINATE ER 100 MG PO TB24
100.0000 mg | ORAL_TABLET | Freq: Every day | ORAL | Status: DC
  Administered 2017-02-05: 100 mg via ORAL
  Filled 2017-02-05: qty 1

## 2017-02-05 MED ORDER — HEPARIN SOD (PORK) LOCK FLUSH 100 UNIT/ML IV SOLN
500.0000 [IU] | INTRAVENOUS | Status: AC | PRN
  Administered 2017-02-05: 500 [IU]

## 2017-02-05 NOTE — Progress Notes (Signed)
Patient heart rate was sustaining in the 120s while at rest, prn cardizem given, patient heart rate started  to fluctuate between 117 to 127 while at rest.. Another prn Cardizem given at at this time.

## 2017-02-05 NOTE — Discharge Summary (Signed)
Physician Discharge Summary  Jesse Avila:[id number] DOB: Aug 08, 1950 DOA: 02/02/2017  PCP: [name], MD  Admit date: 02/02/2017 Discharge date: 02/05/2017  Time spent: 45 minutes  Recommendations for Outpatient Follow-up:  1. Medication changes this admission-metoprolol XL-->metoprolol 75 bid. Needs outpatient follow-up for heart rate and blood pressure in 1 week--cc pcp and dr. Rockey Situ 2. Patient will need a complete blood count in one week 3. Patient is scheduled for CT scan 02/06/2017 to further characterize progression of his stage IV lung cancer-I do not think the patient had pneumonia on discharge from the hospital although on admission it seemed like he might have. He was given rounds of antibiotics which were ultimately discontinued 4. He will probably need further workup with radiation therapist/oncologits and discussion about goals of care as an outpatient  --cc dr mohamed/dr kinard 5. Consider discontinuation of Amaryl as an outpatient dependent on blood sugar trends   Discharge Diagnoses:  Principal Problem:   CAP (community acquired pneumonia) Active Problems:   GERD (gastroesophageal reflux disease)   Essential hypertension, benign   Hyperlipidemia   Diabetes mellitus, type 2 (HCC)   COPD (chronic obstructive pulmonary disease) (HCC)   Small cell lung carcinoma, left (HCC)   Atrial fibrillation (HCC)   Anemia   Acute on chronic diastolic CHF (congestive heart failure) ([place])   Discharge Condition: Improved   Diet recommendation: Heart healthy   Filed Weights   02/03/17 0555 02/04/17 0659 02/05/17 0353  Weight: 102 kg (224 lb 13.9 oz) 103 kg (227 lb 1.2 oz) 102 kg (224 lb 13.9 oz)    History of present illness:  this 66 year old male 66 year old male with history of stage TIV N2 M1 be small cell lung cancer , chronic COPD, as well as 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 Essie Hart will be cc on note             Continue scheduledfollow-ups per oncology.             Wife states patient is scheduled for Chest CT scan this week             Note that dosage reduction of AUC done on 6/13, transfuse 2 units in the past month as an outpatient             We will ask Dr. Earlie Server to discuss palliative care with the patient as an outpatient Bronchodilators as needed            CXR on admit-atelectasis, vs masses + Effusions Continue telemetry, which shows atrial fibrillation with rapid ventricular response Continue bronchodilators. The patient initially was on IV Levaquin and and this was changed to oral Levaquin on day of discharge and subsequently discontinued after discharge as clinically this did not seem to be pneumonia The patient's strep pneumo were negative  chronic diastolic CHF (congestive heart failure) ([place]), doubt exacerbation Atrial fibrillation (HCC)--heart rate still in ! 130's Essential hypertension, benign  CHA2DS2-VASc Score of at least 6.  Continue diltiazem 120 cd  andmetoprolol xl 50 for rate control.                         Added Toprol 12.5 --02/03/17  Monitor trend of HR  We did titrate his medications and ultimately decided on changing his metoprolol XL 50 mg to 75 mg twice a day for tighter rate control as an outpatient. He may follow-up with his cardiologist Dr. Vilinda Boehringer Tavernier-cc to him Continue Eliquis  for [facility]. Optimize electrolytes-magnesium was a little bit low at 1.8 and he was sent home with 20 tablets of mag Prn Cardizem for heart rate greater than 110 Transfused this admission1 unit of PRBC to goal hemoglobin of 8.1  Long discussion with family explaining some of the pathophysiology-patient seems to clearly understand that this might be related to his cancer and might need further discussion as an outpatient as above  GERD (gastroesophageal reflux disease)             Protonix 40 mg by mouth daily  Hyperlipidemia D/c fenofibrate 160 mg d/c Crestor 40 mg by mouth daily. LFTs and fasting lipid monitoring as an outpatient.  Diabetes mellitus, type 2 ([place]) Carbohydrate modified the patient's Amaryl was temporarily held secondary to slightly low sugars and has.jprog  but restarted on discharge Continue  Actos 15 mg by mouth daily. Hold metformin while hospitalized but can continue as an outpatient  CBG monitoring before meals CBG range 71-93  TCP/Anemia, macrocytic Ovalocytes and tear shaped cells 5/23--?Fe def?             patient is status post receiving chemotherapy 3 weeks ago.             On admission hemoglobin found to be 6.4              S/P 1 unit PRBC 7/3 hemoglobin 7.1 currently             Aiming for hemoglobin of 8 we will repeat transfusion, 1 unit PRBC 7/4              patient apparently did not have any dark stools so we did not Hemoccult  --Ferritin was elevated at 616 indicative of acute inflammatory process and no further interpretation  of his blood indices was attempted--defer to oncology   Consultations:None  discharge Exam: Vitals:   02/05/17 1451 02/05/17 1455  BP: 112/77 126/68  Pulse: (!) 120 (!) 115  Resp: 18 (!) 22  Temp:     Patient looks well is oriented has no distress and is asking to go home  Wife and family are at the bedside  General: eomim ncat Cardiovascular: s1 s2 no m/r/g, slight tachy Respiratory:  Clear no distress abd  soft nt nd no rebound  Discharge Instructions   Discharge Instructions    Call MD for:  severe uncontrolled pain    Complete by:  As directed    Call MD for:  temperature >100.4    Complete by:  As directed    Diet - low sodium heart healthy    Complete by:  As directed    Discharge instructions    Complete by:  As directed    You were diagnosed with pneumonia but had very few signs and symptoms of-I do think that you  need a close follow-up as an outpatient with your oncologist to get a repeat CT scan. You have a history of stage IV lung cancer and I feel that this is probably a more likely scenario and you will need workup as an outpatient for further options for this You will probably need an higher dose of one of your need a blocker medications which we have changed in addition I would recommend that you get lab work done in the next 1 week as you received 2 unit blood transfusions this admission I do not recommend that you take magnesium for a long time however you do need to have labs checked Please do not take any over-the-counter medications containing pseudoephedrine as this can raise your heart rate   Increase activity slowly    Complete by:  As directed      Current Discharge Medication List    START taking these medications   Details  magnesium oxide (MAG-OX) 400 (241.3 Mg) MG tablet Take 1 tablet (400 mg total) by mouth 2 (two) times daily. Qty: 15 tablet, Refills: 0    metoprolol tartrate 75 MG TABS Take 75 mg by mouth 2 (two) times daily. Qty: 60 tablet, Refills: 11      CONTINUE these medications which have NOT CHANGED   Details  apixaban (ELIQUIS) 5 MG TABS tablet Take 1 tablet (5 mg total) by mouth 2 (two) times daily. Qty: 60 tablet, Refills: 3    Blood Glucose Monitoring Suppl (ACCU-CHEK NANO SMARTVIEW) w/Device KIT by Does not apply route.    dextromethorphan-guaiFENesin (MUCINEX DM) 30-600 MG 12hr tablet Take 1 tablet by mouth 2 (two) times daily.     diltiazem (CARDIZEM CD) 120 MG 24 hr capsule Take 1 capsule (120 mg total) by mouth daily. Qty: 30 capsule, Refills: 2    glimepiride (AMARYL) 4 MG tablet Take 4 mg by mouth daily with breakfast.    guaiFENesin (ROBITUSSIN) 100 MG/5ML liquid Take 200 mg by mouth 3 (three) times daily as needed for cough.    lidocaine-prilocaine (EMLA) cream Apply 1 application topically as needed. Qty: 30 g, Refills: 0   Associated Diagnoses: Small cell lung carcinoma, left ([place]); Encounter for antineoplastic chemotherapy    metFORMIN (GLUCOPHAGE-XR) 500 MG 24 hr tablet Take 500 mg by mouth 3 (three) times daily. Refills: 1    oxyCODONE-acetaminophen (PERCOCET/ROXICET) 5-325 MG tablet Take 1 tablet by mouth every 6 (six) hours as needed for severe pain. Qty: 30 tablet, Refills: 0    pioglitazone (ACTOS) 15 MG tablet Take 15 mg by mouth daily.    rosuvastatin (CRESTOR) 40 MG tablet Take 40 mg by mouth every evening. For cholesterol    zolpidem (AMBIEN) 10 MG tablet Take 10 mg by mouth at bedtime as needed for sleep.       STOP taking these medications     fenofibrate 160 MG tablet      metoprolol succinate (TOPROL-XL) 50 MG 24 hr tablet      phenylephrine (SUDAFED PE) 10 MG TABS tablet        Allergies  Allergen Reactions  . Cialis [Tadalafil] Other (See Comments)    Reaction:  Headache       The results of significant diagnostics from this hospitalization (including imaging, microbiology, ancillary and laboratory) are listed below for reference.    Significant Diagnostic Studies: Dg Chest 2 View  Result Date: 02/02/2017 CLINICAL DATA:  T shortness of breath over the past several days. History of lung malignancy. Last chemotherapy  performed 3 weeks ago. The patient has previously undergone radiation therapy. Former smoker. History of hypertension and diabetes and CVA. EXAM: CHEST  2 VIEW COMPARISON:  CT scan chest of Dec 16, 2016 and chest x-ray of October 21, 2016. FINDINGS: The left  perihilar and suprahilar region exhibits increased density consistent with pneumonia superimposed upon known underlying mass. The left lower lung is clear but there is a small left pleural effusion. The right lung is clear with exception of a small pleural effusion. The cardiac silhouette is enlarged. The pulmonary vascularity is normal. The power port catheter tip projects over the midportion of the SVC. The observed bony thorax exhibits no acute abnormality. IMPRESSION: Increased prominence of left perihilar and suprahilar densities which may reflect pneumonia or atelectasis superimposed upon known underlying mass lesions. New small bilateral pleural effusions greatest on the left. No overt pulmonary vascular congestion. Electronically Signed   By: David  Martinique M.D.   On: 02/02/2017 14:05    Microbiology: No results found for this or any previous visit (from the past 240 hour(s)).   Labs: Basic Metabolic Panel:  Recent Labs Lab 02/02/17 1309 02/03/17 0545 02/04/17 0523 02/05/17 0420 02/05/17 0423  NA 139 139 140  --  137  K 3.5 3.5 3.5  --  3.5  CL 108 107 108  --  104  CO2 '24 26 24  '$ --  25  GLUCOSE 125* 95 71  --  115*  BUN '12 10 13  '$ --  15  CREATININE 0.91 0.99 1.01  --  1.04  CALCIUM 9.1 8.6* 8.5*  --  8.8*  MG 1.8  --   --  1.8  --   PHOS 3.9  --   --   --   --    Liver Function Tests:  Recent Labs Lab 02/02/17 1309 02/04/17 0523 02/05/17 0423  AST '21 16 15  '$ ALT 24 17 15*  ALKPHOS 50 45 49  BILITOT 0.7 0.7 0.6  PROT 6.5 5.9* 6.3*  ALBUMIN 3.5 3.0* 3.2*   No results for input(s): LIPASE, AMYLASE in the last 168 hours. No results for input(s): AMMONIA in the last 168 hours. CBC:  Recent Labs Lab 02/02/17 1309 02/03/17 0545 02/04/17 0523 02/05/17 0423  WBC 4.6 4.0 5.2 5.1  NEUTROABS 3.5 2.8 3.8 3.8  HGB 7.5* 6.4* 7.1* 8.1*  HCT 22.8* 19.6* 21.5* 24.0*  MCV 102.2* 101.6* 100.0 98.0  PLT 88* 91* 99* 109*   Cardiac Enzymes: No results for input(s): CKTOTAL,  CKMB, CKMBINDEX, TROPONINI in the last 168 hours. BNP: BNP (last 3 results)  Recent Labs  02/02/17 1309  BNP 452.2*    ProBNP (last 3 results) No results for input(s): PROBNP in the last 8760 hours.  CBG:  Recent Labs Lab 02/04/17 0835  GLUCAP 93       Signed:  Nita Sells MD   [facility] 02/05/2017, 3:33 PM

## 2017-02-06 ENCOUNTER — Ambulatory Visit (HOSPITAL_COMMUNITY)
Admission: RE | Admit: 2017-02-06 | Discharge: 2017-02-06 | Disposition: A | Discharge status: Home / Self Care | Payer: BLUE CROSS/BLUE SHIELD | Source: Ambulatory Visit | Attending: Nurse Practitioner | Admitting: Nurse Practitioner

## 2017-02-06 DIAGNOSIS — I7789 Other specified disorders of arteries and arterioles: Secondary | ICD-10-CM | POA: Diagnosis not present

## 2017-02-06 DIAGNOSIS — K7460 Unspecified cirrhosis of liver: Secondary | ICD-10-CM | POA: Insufficient documentation

## 2017-02-06 DIAGNOSIS — I2510 Atherosclerotic heart disease of native coronary artery without angina pectoris: Secondary | ICD-10-CM | POA: Diagnosis not present

## 2017-02-06 DIAGNOSIS — C3492 Malignant neoplasm of unspecified part of left bronchus or lung: Secondary | ICD-10-CM | POA: Insufficient documentation

## 2017-02-06 DIAGNOSIS — J90 Pleural effusion, not elsewhere classified: Secondary | ICD-10-CM | POA: Diagnosis not present

## 2017-02-06 DIAGNOSIS — I708 Atherosclerosis of other arteries: Secondary | ICD-10-CM | POA: Insufficient documentation

## 2017-02-06 DIAGNOSIS — J929 Pleural plaque without asbestos: Secondary | ICD-10-CM | POA: Diagnosis not present

## 2017-02-06 DIAGNOSIS — R918 Other nonspecific abnormal finding of lung field: Secondary | ICD-10-CM | POA: Insufficient documentation

## 2017-02-06 DIAGNOSIS — I700 Atherosclerosis of aorta: Secondary | ICD-10-CM | POA: Insufficient documentation

## 2017-02-06 MED ORDER — IOPAMIDOL (ISOVUE-300) INJECTION 61%
INTRAVENOUS | Status: AC
  Filled 2017-02-06: qty 100

## 2017-02-06 MED ORDER — IOPAMIDOL (ISOVUE-300) INJECTION 61%
100.0000 mL | Freq: Once | INTRAVENOUS | Status: AC | PRN
  Administered 2017-02-06: 100 mL via INTRAVENOUS

## 2017-02-07 LAB — TYPE AND SCREEN
ABO/RH(D): O POS
Antibody Screen: NEGATIVE
Unit division: 0
Unit division: 0
Unit division: 0

## 2017-02-07 LAB — BPAM RBC
Blood Product Expiration Date: 201807262359
Blood Product Expiration Date: 201807262359
Blood Product Expiration Date: 201807262359
ISSUE DATE / TIME: 201807031355
ISSUE DATE / TIME: 201807041649
Unit Type and Rh: 5100
Unit Type and Rh: 5100
Unit Type and Rh: 5100

## 2017-02-10 ENCOUNTER — Other Ambulatory Visit (HOSPITAL_BASED_OUTPATIENT_CLINIC_OR_DEPARTMENT_OTHER): Payer: BLUE CROSS/BLUE SHIELD

## 2017-02-10 ENCOUNTER — Telehealth: Payer: Self-pay | Admitting: Internal Medicine

## 2017-02-10 ENCOUNTER — Ambulatory Visit (HOSPITAL_BASED_OUTPATIENT_CLINIC_OR_DEPARTMENT_OTHER): Payer: BLUE CROSS/BLUE SHIELD | Admitting: Internal Medicine

## 2017-02-10 ENCOUNTER — Encounter: Payer: Self-pay | Admitting: Internal Medicine

## 2017-02-10 VITALS — BP 121/69 | HR 61 | Temp 98.2°F | Resp 18 | Ht 73.0 in | Wt 217.9 lb

## 2017-02-10 DIAGNOSIS — I739 Peripheral vascular disease, unspecified: Secondary | ICD-10-CM

## 2017-02-10 DIAGNOSIS — Z7189 Other specified counseling: Secondary | ICD-10-CM

## 2017-02-10 DIAGNOSIS — D61810 Antineoplastic chemotherapy induced pancytopenia: Secondary | ICD-10-CM

## 2017-02-10 DIAGNOSIS — C3492 Malignant neoplasm of unspecified part of left bronchus or lung: Secondary | ICD-10-CM

## 2017-02-10 DIAGNOSIS — C3412 Malignant neoplasm of upper lobe, left bronchus or lung: Secondary | ICD-10-CM

## 2017-02-10 DIAGNOSIS — C7951 Secondary malignant neoplasm of bone: Secondary | ICD-10-CM | POA: Diagnosis not present

## 2017-02-10 DIAGNOSIS — I10 Essential (primary) hypertension: Secondary | ICD-10-CM

## 2017-02-10 DIAGNOSIS — D6481 Anemia due to antineoplastic chemotherapy: Secondary | ICD-10-CM | POA: Diagnosis not present

## 2017-02-10 DIAGNOSIS — Z5111 Encounter for antineoplastic chemotherapy: Secondary | ICD-10-CM

## 2017-02-10 LAB — CBC WITH DIFFERENTIAL/PLATELET
BASO%: 0.2 % (ref 0.0–2.0)
Basophils Absolute: 0 10*3/uL (ref 0.0–0.1)
EOS ABS: 0.1 10*3/uL (ref 0.0–0.5)
EOS%: 1.1 % (ref 0.0–7.0)
HCT: 27.8 % — ABNORMAL LOW (ref 38.4–49.9)
HGB: 8.8 g/dL — ABNORMAL LOW (ref 13.0–17.1)
LYMPH%: 23.6 % (ref 14.0–49.0)
MCH: 31.9 pg (ref 27.2–33.4)
MCHC: 31.7 g/dL — AB (ref 32.0–36.0)
MCV: 100.7 fL — ABNORMAL HIGH (ref 79.3–98.0)
MONO#: 0.5 10*3/uL (ref 0.1–0.9)
MONO%: 11.4 % (ref 0.0–14.0)
NEUT%: 63.7 % (ref 39.0–75.0)
NEUTROS ABS: 2.9 10*3/uL (ref 1.5–6.5)
PLATELETS: 162 10*3/uL (ref 140–400)
RBC: 2.76 10*6/uL — AB (ref 4.20–5.82)
RDW: 22.1 % — ABNORMAL HIGH (ref 11.0–14.6)
WBC: 4.6 10*3/uL (ref 4.0–10.3)
lymph#: 1.1 10*3/uL (ref 0.9–3.3)
nRBC: 0 % (ref 0–0)

## 2017-02-10 LAB — COMPREHENSIVE METABOLIC PANEL
ALT: 14 U/L (ref 0–55)
ANION GAP: 8 meq/L (ref 3–11)
AST: 15 U/L (ref 5–34)
Albumin: 3.4 g/dL — ABNORMAL LOW (ref 3.5–5.0)
Alkaline Phosphatase: 56 U/L (ref 40–150)
BUN: 10.8 mg/dL (ref 7.0–26.0)
CHLORIDE: 108 meq/L (ref 98–109)
CO2: 24 meq/L (ref 22–29)
CREATININE: 1 mg/dL (ref 0.7–1.3)
Calcium: 9.9 mg/dL (ref 8.4–10.4)
EGFR: 78 mL/min/{1.73_m2} — AB (ref >90)
Glucose: 120 mg/dl (ref 70–140)
Potassium: 4.2 mEq/L (ref 3.5–5.1)
SODIUM: 140 meq/L (ref 136–145)
Total Bilirubin: 0.47 mg/dL (ref 0.20–1.20)
Total Protein: 6.8 g/dL (ref 6.4–8.3)

## 2017-02-10 NOTE — Progress Notes (Signed)
[place] [phone]   [phone]  OFFICE PROGRESS NOTE  [name], MD [address] [place] [address]  DIAGNOSIS: Extensive stage (T4, N2, M1b) small cell lung cancer presented with large left upper lobe lung mass with mediastinal invasion as well as pancreatic lesion and suspicious left iliac bone metastasis diagnosed in February 2018.  PRIOR THERAPY: Systemic chemotherapy with carboplatin for AUC of 5 on day 1 and etoposide 100 MG/M2 on days 1, 2 and 3 with Neulasta support status post 6 cycles. This is concurrent with radiotherapy. This also concurrent with radiation to the large lung mass.   CURRENT THERAPY: None.   INTERVAL HISTORY: Jesse Avila 66 y.o. male returns to the clinic today for follow-up visit, by his wife and daughter. The patient is feeling fine today was no specific complaints except for fatigue. He was recently admitted to [facility] with questionable pneumonia. He is feeling much better today. He denied having any chest pain, shortness of breath, cough or hemoptysis. He denied having any fever or chills. He has no nausea, vomiting, diarrhea or constipation. He completed 6 cycles of systemic chemotherapy with carboplatin and etoposide and tolerated the treatment well except for pancytopenia. He had repeat CT scan of the chest, abdomen and pelvis performed recently and he is here for evaluation and discussion of his scan results.   MEDICAL HISTORY: Past Medical History:  Diagnosis Date  . Carotid artery occlusion   . DJD (degenerative joint disease) of knee   . Essential hypertension, benign    takes Benicar daily  . Goals of care, counseling/discussion 09/18/2016  . History of colon polyps    benign  . History of gout   . History of radiation therapy 09/30/16-11/10/16   left lung 60 Gy in 30 fractions  . Hyperlipidemia    takes Fenofibrate and Crestor daily  . Insomnia    takes  Ambien nightly as needed  . Joint pain   . Lung cancer ([place])   . Nocturia   . Peripheral vascular disease ([place])   . Primary localized osteoarthritis of left knee 12/26/2014  . Stroke [facility]) 2015?   takes Plavix daily as well as Pletal,not on plavix at present 05/15/15  . 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  MEDICATIONS:  Current Outpatient Prescriptions  Medication Sig Dispense Refill  . apixaban (ELIQUIS) 5 MG TABS tablet Take 1 tablet (5 mg total) by mouth 2 (two) times daily. 60 tablet 3  . Blood Glucose Monitoring Suppl (ACCU-CHEK NANO SMARTVIEW) w/Device KIT by Does not apply route.    Marland Kitchen dextromethorphan-guaiFENesin (MUCINEX DM) 30-600 MG 12hr tablet Take 1 tablet by mouth 2 (two) times daily.    Marland Kitchen diltiazem (CARDIZEM CD) 120 MG 24 hr capsule Take 1 capsule (120 mg total) by mouth daily. 30 capsule 2  . glimepiride (AMARYL) 4 MG tablet Take 4 mg by mouth daily with breakfast.    . guaiFENesin (ROBITUSSIN) 100 MG/5ML liquid Take 200 mg by mouth 3 (three) times daily as needed for cough.    . lidocaine-prilocaine (EMLA) cream Apply 1 application topically as needed. 30 g 0  . magnesium oxide (MAG-OX) 400 (241.3 Mg) MG tablet Take 1 tablet (400 mg total) by mouth 2 (two) times daily. 15 tablet 0  .  metFORMIN (GLUCOPHAGE-XR) 500 MG 24 hr tablet Take 500 mg by mouth 3 (three) times daily.  1  . metoprolol tartrate 75 MG TABS Take 75 mg by mouth 2 (two) times daily. 60 tablet 11  . oxyCODONE-acetaminophen (PERCOCET/ROXICET) 5-325 MG tablet Take 1 tablet by mouth every 6 (six) hours as needed for severe pain. 30 tablet 0  . pioglitazone (ACTOS) 15 MG tablet Take 15 mg by mouth daily.    . rosuvastatin (CRESTOR) 40 MG tablet Take 40 mg by mouth every evening. For cholesterol    . zolpidem (AMBIEN) 10 MG tablet Take 10 mg by mouth  at bedtime as needed for sleep.      No current facility-administered medications for this visit.    Facility-Administered Medications Ordered in Other Visits  Medication Dose Route Frequency Provider Last Rate Last Dose  . heparin lock flush 100 unit/mL  500 Units Intracatheter Once PRN Si Gaul, MD      . sodium chloride flush (NS) 0.9 % injection 10 mL  10 mL Intracatheter PRN Si Gaul, MD   10 mL at 10/13/16 1618  . sodium chloride flush (NS) 0.9 % injection 10 mL  10 mL Intracatheter PRN Si Gaul, MD      . sodium chloride flush (NS) 0.9 % injection 10 mL  10 mL Intracatheter PRN Si Gaul, MD   10 mL at 11/26/16 1544    SURGICAL HISTORY:  Past Surgical History:  Procedure Laterality Date  . ABDOMINAL AORTAGRAM N/A 03/29/2012   Procedure: ABDOMINAL Ronny Flurry;  Surgeon: Chuck Hint, MD;  Location: [facility] CATH LAB;  Service: Cardiovascular;  Laterality: N/A;  . COLONOSCOPY    . ENDARTERECTOMY Right 05/09/2014   Procedure: RIGHT CAROTID ENDARTERECTOMY WITH PATCH ANGIOPLASTY;  Surgeon: Fransisco Hertz, MD;  Location: [facility] OR;  Service: Vascular;  Laterality: Right;  . ENDARTERECTOMY FEMORAL Right 05/17/2015   Procedure: ENDARTERECTOMY FEMORAL;  Surgeon: Chuck Hint, MD;  Location: [facility] OR;  Service: Vascular;  Laterality: Right;  . ENDOBRONCHIAL ULTRASOUND Bilateral 09/08/2016   Procedure: ENDOBRONCHIAL ULTRASOUND;  Surgeon: Oretha Milch, MD;  Location: [facility] ENDOSCOPY;  Service: Cardiopulmonary;  Laterality: Bilateral;  . FEMORAL-POPLITEAL BYPASS GRAFT Right 05/17/2015   Procedure: BYPASS GRAFT FEMORAL-BELOW KNEE POPLITEAL ARTERY, using gortex propaten graft 6 mm x 80 cm;  Surgeon: Chuck Hint, MD;  Location: [facility] OR;  Service: Vascular;  Laterality: Right;  . IR GENERIC HISTORICAL  09/25/2016   IR FLUORO GUIDE PORT INSERTION RIGHT 09/25/2016 Richarda Overlie, MD [facility]-INTERV RAD  . IR GENERIC HISTORICAL  09/25/2016   IR US GUIDE VASC ACCESS RIGHT 09/25/2016  Richarda Overlie, MD [facility]-INTERV RAD  . KNEE ARTHROSCOPY Right 11/2009  . LOWER EXTREMITY ANGIOGRAM Bilateral 03/23/2015   Procedure: Lower Extremity Angiogram;  Surgeon: Chuck Hint, MD;  Location: [facility] INVASIVE CV LAB;  Service: Cardiovascular;  Laterality: Bilateral;  . MENISCUS REPAIR  11/2009  . PARTIAL KNEE ARTHROPLASTY Left 12/26/2014   Procedure: UNICOMPARTMENTAL KNEE;  Surgeon: Teryl Lucy, MD;  Location: [facility] OR;  Service: Orthopedics;  Laterality: Left;  . PATCH ANGIOPLASTY Right 05/17/2015   Procedure: VEIN PATCH ANGIOPLASTY ILEOFEMORAL ARTERY;  Surgeon: Chuck Hint, MD;  Location: [facility] OR;  Service: Vascular;  Laterality: Right;  . PERCUTANEOUS STENT INTERVENTION N/A 05/10/2012   Procedure: PERCUTANEOUS STENT INTERVENTION;  Surgeon: Chuck Hint, MD;  Location: [facility] CATH LAB;  Service: Cardiovascular;  Laterality: N/A;  . PERIPHERAL VASCULAR CATHETERIZATION N/A 03/23/2015   Procedure: Abdominal Aortogram;  Surgeon: Chuck Hint,  MD;  Location: [facility] CV LAB;  Service: Cardiovascular;  Laterality: N/A;  . STENTS     PLACED IN ??BOTH LEGS   2013?    REVIEW OF SYSTEMS:  Constitutional: positive for fatigue Eyes: negative Ears, nose, mouth, throat, and face: negative Respiratory: positive for dyspnea on exertion Cardiovascular: negative Gastrointestinal: negative Genitourinary:negative Integument/breast: negative Hematologic/lymphatic: negative Musculoskeletal:negative Neurological: negative Behavioral/Psych: negative Endocrine: negative Allergic/Immunologic: negative   PHYSICAL EXAMINATION: General appearance: alert, cooperative, fatigued and no distress Head: Normocephalic, without obvious abnormality, atraumatic Neck: no adenopathy, no JVD, supple, symmetrical, trachea midline and thyroid not enlarged, symmetric, no tenderness/mass/nodules Lymph nodes: Cervical, supraclavicular, and axillary nodes normal. Resp: clear to auscultation  bilaterally Back: symmetric, no curvature. ROM normal. No CVA tenderness. Cardio: regular rate and rhythm, S1, S2 normal, no murmur, click, rub or gallop GI: soft, non-tender; bowel sounds normal; no masses,  no organomegaly Extremities: extremities normal, atraumatic, no cyanosis or edema Neurologic: Alert and oriented X 3, normal strength and tone. Normal symmetric reflexes. Normal coordination and gait  ECOG PERFORMANCE STATUS: 1 - Symptomatic but completely ambulatory  Blood pressure 121/69, pulse 61, temperature 98.2 F (36.8 C), temperature source Oral, resp. rate 18, height '6\' 1"'$  (1.854 m), weight 217 lb 14.4 oz (98.8 kg), SpO2 98 %.  LABORATORY DATA: Lab Results  Component Value Date   WBC 4.6 02/10/2017   HGB 8.8 (L) 02/10/2017   HCT 27.8 (L) 02/10/2017   MCV 100.7 (H) 02/10/2017   PLT 162 02/10/2017      Chemistry      Component Value Date/Time   NA 140 02/10/2017 1313   K 4.2 02/10/2017 1313   CL 104 02/05/2017 0423   CO2 24 02/10/2017 1313   BUN 10.8 02/10/2017 1313   CREATININE 1.0 02/10/2017 1313      Component Value Date/Time   CALCIUM 9.9 02/10/2017 1313   ALKPHOS 56 02/10/2017 1313   AST 15 02/10/2017 1313   ALT 14 02/10/2017 1313   BILITOT 0.47 02/10/2017 1313       RADIOGRAPHIC STUDIES: Dg Chest 2 View  Result Date: 02/02/2017 CLINICAL DATA:  T shortness of breath over the past several days. History of lung malignancy. Last chemotherapy performed 3 weeks ago. The patient has previously undergone radiation therapy. Former smoker. History of hypertension and diabetes and CVA. EXAM: CHEST  2 VIEW COMPARISON:  CT scan chest of Dec 16, 2016 and chest x-ray of October 21, 2016. FINDINGS: The left perihilar and suprahilar region exhibits increased density consistent with pneumonia superimposed upon known underlying mass. The left lower lung is clear but there is a small left pleural effusion. The right lung is clear with exception of a small pleural effusion. The  cardiac silhouette is enlarged. The pulmonary vascularity is normal. The power port catheter tip projects over the midportion of the SVC. The observed bony thorax exhibits no acute abnormality. IMPRESSION: Increased prominence of left perihilar and suprahilar densities which may reflect pneumonia or atelectasis superimposed upon known underlying mass lesions. New small bilateral pleural effusions greatest on the left. No overt pulmonary vascular congestion. Electronically Signed   By: David  Martinique M.D.   On: 02/02/2017 14:05   Ct Chest W Contrast  Result Date: 02/06/2017 CLINICAL DATA:  CLINICAL DATA Lung cancer diagnosed earlier this year. Chemotherapy in progress. Radiation therapy complete. Asymptomatic. Small-cell, left-sided. EXAM: CT CHEST, ABDOMEN, AND PELVIS WITH CONTRAST TECHNIQUE: Multidetector CT imaging of the chest, abdomen and pelvis was performed following the standard protocol during bolus  administration of intravenous contrast. CONTRAST:  186m ISOVUE-300 IOPAMIDOL (ISOVUE-300) INJECTION 61% COMPARISON:  12/16/2016 and chest radiograph of 02/02/2017. FINDINGS: CT CHEST FINDINGS Cardiovascular: A right-sided Port-A-Cath which terminates at the superior caval/atrial junction. Advanced aortic and branch vessel atherosclerosis. Mild cardiomegaly, without pericardial effusion. Multivessel coronary artery atherosclerosis. No central pulmonary embolism, on this non-dedicated study. Pulmonary artery enlargement, outflow tract 3.7 cm. Mediastinum/Nodes: No supraclavicular adenopathy. No separate areas of mediastinal adenopathy. No hilar adenopathy. Lungs/Pleura: Bilateral calcified pleural plaques. Moderate left pleural effusion is new. Trace right pleural fluid is also new. Similar left upper lobe endobronchial narrowing. Mild centrilobular and paraseptal emphysema. Left suprahilar soft tissue density with extension into the AP window region. Example 4.4 x 3.3 cm on image 20/series 2. Compare 5.4 x 3.8  cm on the prior exam. Cephalad pulmonary parenchymal component measures 2.1 cm on image 41/series 4 versus 2.3 cm at the same level on the prior. Increased and new left upper and superior segment left lower lobe ground-glass and airspace opacities with areas of septal thickening. Musculoskeletal: No acute osseous abnormality. CT ABDOMEN PELVIS FINDINGS Hepatobiliary: Caudate and lateral segment left liver lobe enlargement. No focal liver lesion. Normal gallbladder, without biliary ductal dilatation. Pancreas: Normal, without mass or ductal dilatation. Spleen: Normal in size, without focal abnormality. Adrenals/Urinary Tract: Normal adrenal glands. Normal kidneys, without hydronephrosis. Normal urinary bladder. Stomach/Bowel: Normal stomach, without wall thickening. Extensive colonic diverticulosis. Normal terminal ileum and appendix. Normal small bowel Vascular/Lymphatic: Advanced aortic and branch vessel atherosclerosis. No abdominopelvic adenopathy. Reproductive: Normal prostate. Other: No significant free fluid. No evidence of omental or peritoneal disease. Tiny fat containing left inguinal and periumbilical ventral abdominal wall hernias. Musculoskeletal: Left iliac sclerosis is unchanged including on image 101/series 2. IMPRESSION: 1. Further decrease in size of soft tissue density lesion in the left suprahilar region with extension into the AP window. 2. Worsened left-sided aeration with new increased ground-glass and airspace opacities. Likely radiation induced. 3. New small left and trace right pleural effusions. 4. Bilateral partially calcified pleural plaques, consistent with asbestos related pleural disease. 5. Mild cirrhosis. 6. Coronary artery atherosclerosis. Aortic Atherosclerosis (ICD10-I70.0). 7. Similar vague left-sided iliac sclerosis, indeterminate. 8. Pulmonary artery enlargement suggests pulmonary arterial hypertension. Electronically Signed   By: KAbigail MiyamotoM.D.   On: 02/06/2017 09:32   Ct  Abdomen Pelvis W Contrast  Result Date: 02/06/2017 CLINICAL DATA:  CLINICAL DATA Lung cancer diagnosed earlier this year. Chemotherapy in progress. Radiation therapy complete. Asymptomatic. Small-cell, left-sided. EXAM: CT CHEST, ABDOMEN, AND PELVIS WITH CONTRAST TECHNIQUE: Multidetector CT imaging of the chest, abdomen and pelvis was performed following the standard protocol during bolus administration of intravenous contrast. CONTRAST:  1024mISOVUE-300 IOPAMIDOL (ISOVUE-300) INJECTION 61% COMPARISON:  12/16/2016 and chest radiograph of 02/02/2017. FINDINGS: CT CHEST FINDINGS Cardiovascular: A right-sided Port-A-Cath which terminates at the superior caval/atrial junction. Advanced aortic and branch vessel atherosclerosis. Mild cardiomegaly, without pericardial effusion. Multivessel coronary artery atherosclerosis. No central pulmonary embolism, on this non-dedicated study. Pulmonary artery enlargement, outflow tract 3.7 cm. Mediastinum/Nodes: No supraclavicular adenopathy. No separate areas of mediastinal adenopathy. No hilar adenopathy. Lungs/Pleura: Bilateral calcified pleural plaques. Moderate left pleural effusion is new. Trace right pleural fluid is also new. Similar left upper lobe endobronchial narrowing. Mild centrilobular and paraseptal emphysema. Left suprahilar soft tissue density with extension into the AP window region. Example 4.4 x 3.3 cm on image 20/series 2. Compare 5.4 x 3.8 cm on the prior exam. Cephalad pulmonary parenchymal component measures 2.1 cm on image 41/series 4 versus  2.3 cm at the same level on the prior. Increased and new left upper and superior segment left lower lobe ground-glass and airspace opacities with areas of septal thickening. Musculoskeletal: No acute osseous abnormality. CT ABDOMEN PELVIS FINDINGS Hepatobiliary: Caudate and lateral segment left liver lobe enlargement. No focal liver lesion. Normal gallbladder, without biliary ductal dilatation. Pancreas: Normal, without  mass or ductal dilatation. Spleen: Normal in size, without focal abnormality. Adrenals/Urinary Tract: Normal adrenal glands. Normal kidneys, without hydronephrosis. Normal urinary bladder. Stomach/Bowel: Normal stomach, without wall thickening. Extensive colonic diverticulosis. Normal terminal ileum and appendix. Normal small bowel Vascular/Lymphatic: Advanced aortic and branch vessel atherosclerosis. No abdominopelvic adenopathy. Reproductive: Normal prostate. Other: No significant free fluid. No evidence of omental or peritoneal disease. Tiny fat containing left inguinal and periumbilical ventral abdominal wall hernias. Musculoskeletal: Left iliac sclerosis is unchanged including on image 101/series 2. IMPRESSION: 1. Further decrease in size of soft tissue density lesion in the left suprahilar region with extension into the AP window. 2. Worsened left-sided aeration with new increased ground-glass and airspace opacities. Likely radiation induced. 3. New small left and trace right pleural effusions. 4. Bilateral partially calcified pleural plaques, consistent with asbestos related pleural disease. 5. Mild cirrhosis. 6. Coronary artery atherosclerosis. Aortic Atherosclerosis (ICD10-I70.0). 7. Similar vague left-sided iliac sclerosis, indeterminate. 8. Pulmonary artery enlargement suggests pulmonary arterial hypertension. Electronically Signed   By: Abigail Miyamoto M.D.   On: 02/06/2017 09:32    ASSESSMENT AND PLAN:  This is a very pleasant 66 years old white male with extensive stage small cell lung cancer completed 6 cycles of systemic chemotherapy with carboplatin and etoposide concurrent with radiation to the large left upper lobe mass.  The patient tolerated the treatment well except for the chemotherapy-induced pancytopenia and significant anemia.  He had repeat CT scan of the chest performed recently. I personally and independently reviewed the scan results and discuss it with the patient and his  family. His scan showed further improvement of his disease. I recommended for the patient to continue on observation for now with repeat CT scan of the chest, abdomen and pelvis in 2 months. The patient and his family understand the hiatus: Recurrence in small cell lung cancer and that the patient has incurable condition. I also referred the patient back to Dr. Sondra Come for consideration of prophylactic cranial irradiation. For the chemotherapy-induced anemia, the patient will continue on observation for now and we will consider transfusion if his hemoglobin is less than 0.8. He was also advised to consult with his primary care physician or gastroenterologist if he notices any blood in his stool. The patient was advised to call immediately if he has any concerning symptoms in the interval. The patient voices understanding of current disease status and treatment options and is in agreement with the current care plan. All questions were answered. The patient knows to call the clinic with any problems, questions or concerns. We can certainly see the patient much sooner if necessary.  Disclaimer: This note was dictated with voice recognition software. Similar sounding words can inadvertently be transcribed and may not be corrected upon review.

## 2017-02-10 NOTE — Telephone Encounter (Signed)
Scheduled appt per 7/10 los - Gave patient AVS and calender per los.

## 2017-02-11 ENCOUNTER — Encounter: Payer: Self-pay | Admitting: Radiation Oncology

## 2017-02-13 NOTE — Progress Notes (Signed)
Histology and Location of Primary Cancer: Extensive stage (T4, N2, M1b) small cell lung cancer presented with large left upper lobe lung mass with mediastinal invasion as well as pancreatic lesion and suspicious left iliac bone metastasis diagnosed in February 2018.  Past/Anticipated chemotherapy by medical oncology, if any: Systemic chemotherapy with carboplatin for AUC of 5 on day 1 and etoposide 100 MG/M2 on days 1, 2 and 3 with Neulasta support status post 6 cycles.  SAFETY ISSUES:  Prior radiation? 09/30/16-11/10/16 left lung 60 Gy in 30 fractions  Pacemaker/ICD? no  Possible current pregnancy? no  Is the patient on methotrexate? no  Additional Complaints / other details:  Referral for prophylactic cranial irradiation.  Patient reports feeling very fatigued, having no stamina and becoming short of breath more easily.  Patient also states he has no appetite and is forcing himself to eat.  It is noted that the patient has recently lost some weight 5-7 lbs in past month.  Patient was showing heart rate resembling Afib occassionally when doing vital signs.  He states that his cardiologist is tweaking his medications in attempts to get it under control.  Patient has questions about why he needs prophylactic treatment to his brain when his scans are clean.     Vitals:   02/19/17 1259  BP: (!) 115/97  Pulse: (!) 57  Resp: 20  Temp: 98.3 F (36.8 C)  TempSrc: Oral  SpO2: 96%  Weight: 220 lb 12.8 oz (100.2 kg)   Wt Readings from Last 3 Encounters:  02/19/17 220 lb 12.8 oz (100.2 kg)  02/10/17 217 lb 14.4 oz (98.8 kg)  02/05/17 224 lb 13.9 oz (102 kg)

## 2017-02-19 ENCOUNTER — Encounter: Payer: Self-pay | Admitting: Radiation Oncology

## 2017-02-19 ENCOUNTER — Ambulatory Visit
Admission: RE | Admit: 2017-02-19 | Discharge: 2017-02-19 | Disposition: A | Discharge status: Home / Self Care | Payer: BLUE CROSS/BLUE SHIELD | Source: Ambulatory Visit | Attending: Radiation Oncology | Admitting: Radiation Oncology

## 2017-02-19 VITALS — BP 115/97 | HR 57 | Temp 98.3°F | Resp 20 | Wt 220.8 lb

## 2017-02-19 DIAGNOSIS — Z79899 Other long term (current) drug therapy: Secondary | ICD-10-CM | POA: Diagnosis not present

## 2017-02-19 DIAGNOSIS — C3492 Malignant neoplasm of unspecified part of left bronchus or lung: Secondary | ICD-10-CM | POA: Diagnosis not present

## 2017-02-19 DIAGNOSIS — Z7901 Long term (current) use of anticoagulants: Secondary | ICD-10-CM | POA: Diagnosis not present

## 2017-02-19 DIAGNOSIS — Z888 Allergy status to other drugs, medicaments and biological substances status: Secondary | ICD-10-CM | POA: Diagnosis not present

## 2017-02-19 DIAGNOSIS — Z7984 Long term (current) use of oral hypoglycemic drugs: Secondary | ICD-10-CM | POA: Diagnosis not present

## 2017-02-19 DIAGNOSIS — Z510 Encounter for antineoplastic radiation therapy: Secondary | ICD-10-CM | POA: Diagnosis not present

## 2017-02-19 NOTE — Progress Notes (Signed)
Radiation Oncology         (336) [phone] ________________________________  Name: Jesse Avila MRN: [id number]  Date: 02/19/2017  DOB: [birth year]  Re-Consultation Visit Note  CC: [name], MD  Curt Bears, MD    ICD-10-CM   1. 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 He is accompanied by his wife.  The patient was previously treated with 6 cycles of chemotherapy with carboplatin and etoposide. The patient has followed closely with medical oncology, and presents to this clinic today via referral for prophylactic cranial irradiation.  The patient underwent CT C/A/P on 02/06/17, which revealed further decrease in size of soft tissue density lesion in the left suprahilar region with extension into the AP window. Worsened left-sided aeration with new increased ground-glass and airspace opacities. Likely radiation induced. New small left and trace right pleural effusions. Bilateral partially calcified pleural plaques, consistent with asbestos related pleural disease. Mild cirrhosis.  On review of systems, the patient reports feeling very fatigued, and has no stamina. He denies headaches or vision changes. He reports becoming short of breath more easily than before. He has no appetite, and must force himself to eat. He reports a 5-7 lb weight loss in the past month. He is following with a cardiologist, and reports he is changing his medications to manage his heart rhythms resembling Afib. He questions why he needs prophylactic treatment to his brain since his scans are clear.                      ALLERGIES:  is allergic to cialis [tadalafil].  Meds: Current Outpatient Prescriptions  Medication Sig  Dispense Refill  . apixaban (ELIQUIS) 5 MG TABS tablet Take 1 tablet (5 mg total) by mouth 2 (two) times daily. 60 tablet 3  . Blood Glucose Monitoring Suppl (ACCU-CHEK NANO SMARTVIEW) w/Device KIT by Does not apply route.    . diltiazem (CARDIZEM CD) 120 MG 24 hr capsule Take 1 capsule (120 mg total) by mouth daily. 30 capsule 2  . fenofibrate 160 MG tablet Take 1 tablet by mouth daily.  6  . glimepiride (AMARYL) 4 MG tablet Take 4 mg by mouth daily with breakfast.    . metFORMIN (GLUCOPHAGE-XR) 500 MG 24 hr tablet Take 500 mg by mouth 3 (three) times daily.  1  . metoprolol tartrate 75 MG TABS Take 75 mg by mouth 2 (two) times daily. 60 tablet 11  . pioglitazone (ACTOS) 15 MG tablet Take 15 mg by mouth daily.    . rosuvastatin (CRESTOR) 40 MG tablet Take 40 mg by mouth every evening. For cholesterol    . zolpidem (AMBIEN) 10 MG tablet Take 10 mg by mouth at bedtime as needed for sleep.     Marland Kitchen dextromethorphan-guaiFENesin (MUCINEX DM) 30-600 MG 12hr tablet Take 1 tablet by mouth 2 (two) times daily.    Marland Kitchen guaiFENesin (ROBITUSSIN) 100 MG/5ML liquid Take 200 mg by mouth 3 (three) times daily as needed for cough.    . lidocaine-prilocaine (EMLA) cream Apply 1 application topically as needed. (Patient not taking: Reported on 02/19/2017) 30 g 0  . magnesium oxide (MAG-OX) 400 (241.3 Mg) MG tablet Take 1 tablet (400  mg total) by mouth 2 (two) times daily. (Patient not taking: Reported on 02/19/2017) 15 tablet 0  . oxyCODONE-acetaminophen (PERCOCET/ROXICET) 5-325 MG tablet Take 1 tablet by mouth every 6 (six) hours as needed for severe pain. (Patient not taking: Reported on 02/19/2017) 30 tablet 0   No current facility-administered medications for this encounter.    Facility-Administered Medications Ordered in Other Encounters  Medication Dose Route Frequency Provider Last Rate Last Dose  . heparin lock flush 100 unit/mL  500 Units Intracatheter Once PRN Curt Bears, MD      . sodium chloride flush  (NS) 0.9 % injection 10 mL  10 mL Intracatheter PRN Curt Bears, MD   10 mL at 10/13/16 1618  . sodium chloride flush (NS) 0.9 % injection 10 mL  10 mL Intracatheter PRN Curt Bears, MD      . sodium chloride flush (NS) 0.9 % injection 10 mL  10 mL Intracatheter PRN Curt Bears, MD   10 mL at 11/26/16 1544   REVIEW OF SYSTEMS: A 10+ POINT REVIEW OF SYSTEMS WAS OBTAINED including neurology, dermatology, psychiatry, cardiac, respiratory, lymph, extremities, GI, GU, musculoskeletal, constitutional, reproductive, HEENT. All pertinent positives are noted in the HPI. All others are negative.  Physical Findings: The patient is in no acute distress. Patient is alert and oriented.  weight is 220 lb 12.8 oz (100.2 kg). His oral temperature is 98.3 F (36.8 C). His blood pressure is 115/97 (abnormal) and his pulse is 57 (abnormal). His respiration is 20 and oxygen saturation is 96%.   Oral cavity is clear. PERRL. EOMI. Lungs are clear to auscultation bilaterally. Heart has regular rate and rhythm. No palpable cervical, supraclavicular, or axillary adenopathy. Abdomen soft, non-tender, normal bowel sounds. Edema in bilateral lower extremities. Strength is intact and symmetric throughout. Neurologic: Cranial nerves II through XII are grossly intact. No obvious focalities. Speech is fluent. Coordination is intact. Sensation is grossly intact, if not slightly reduced in the left due to prior stroke.  Lab Findings: Lab Results  Component Value Date   WBC 4.6 02/10/2017   HGB 8.8 (L) 02/10/2017   HCT 27.8 (L) 02/10/2017   MCV 100.7 (H) 02/10/2017   PLT 162 02/10/2017    Radiographic Findings: Dg Chest 2 View  Result Date: 02/02/2017 CLINICAL DATA:  T shortness of breath over the past several days. History of lung malignancy. Last chemotherapy performed 3 weeks ago. The patient has previously undergone radiation therapy. Former smoker. History of hypertension and diabetes and CVA. EXAM: CHEST  2  VIEW COMPARISON:  CT scan chest of Dec 16, 2016 and chest x-ray of October 21, 2016. FINDINGS: The left perihilar and suprahilar region exhibits increased density consistent with pneumonia superimposed upon known underlying mass. The left lower lung is clear but there is a small left pleural effusion. The right lung is clear with exception of a small pleural effusion. The cardiac silhouette is enlarged. The pulmonary vascularity is normal. The power port catheter tip projects over the midportion of the SVC. The observed bony thorax exhibits no acute abnormality. IMPRESSION: Increased prominence of left perihilar and suprahilar densities which may reflect pneumonia or atelectasis superimposed upon known underlying mass lesions. New small bilateral pleural effusions greatest on the left. No overt pulmonary vascular congestion. Electronically Signed   By: David  Martinique M.D.   On: 02/02/2017 14:05   Ct Chest W Contrast  Result Date: 02/06/2017 CLINICAL DATA:  CLINICAL DATA Lung cancer diagnosed earlier this year. Chemotherapy in progress. Radiation therapy complete.  Asymptomatic. Small-cell, left-sided. EXAM: CT CHEST, ABDOMEN, AND PELVIS WITH CONTRAST TECHNIQUE: Multidetector CT imaging of the chest, abdomen and pelvis was performed following the standard protocol during bolus administration of intravenous contrast. CONTRAST:  15m ISOVUE-300 IOPAMIDOL (ISOVUE-300) INJECTION 61% COMPARISON:  12/16/2016 and chest radiograph of 02/02/2017. FINDINGS: CT CHEST FINDINGS Cardiovascular: A right-sided Port-A-Cath which terminates at the superior caval/atrial junction. Advanced aortic and branch vessel atherosclerosis. Mild cardiomegaly, without pericardial effusion. Multivessel coronary artery atherosclerosis. No central pulmonary embolism, on this non-dedicated study. Pulmonary artery enlargement, outflow tract 3.7 cm. Mediastinum/Nodes: No supraclavicular adenopathy. No separate areas of mediastinal adenopathy. No hilar  adenopathy. Lungs/Pleura: Bilateral calcified pleural plaques. Moderate left pleural effusion is new. Trace right pleural fluid is also new. Similar left upper lobe endobronchial narrowing. Mild centrilobular and paraseptal emphysema. Left suprahilar soft tissue density with extension into the AP window region. Example 4.4 x 3.3 cm on image 20/series 2. Compare 5.4 x 3.8 cm on the prior exam. Cephalad pulmonary parenchymal component measures 2.1 cm on image 41/series 4 versus 2.3 cm at the same level on the prior. Increased and new left upper and superior segment left lower lobe ground-glass and airspace opacities with areas of septal thickening. Musculoskeletal: No acute osseous abnormality. CT ABDOMEN PELVIS FINDINGS Hepatobiliary: Caudate and lateral segment left liver lobe enlargement. No focal liver lesion. Normal gallbladder, without biliary ductal dilatation. Pancreas: Normal, without mass or ductal dilatation. Spleen: Normal in size, without focal abnormality. Adrenals/Urinary Tract: Normal adrenal glands. Normal kidneys, without hydronephrosis. Normal urinary bladder. Stomach/Bowel: Normal stomach, without wall thickening. Extensive colonic diverticulosis. Normal terminal ileum and appendix. Normal small bowel Vascular/Lymphatic: Advanced aortic and branch vessel atherosclerosis. No abdominopelvic adenopathy. Reproductive: Normal prostate. Other: No significant free fluid. No evidence of omental or peritoneal disease. Tiny fat containing left inguinal and periumbilical ventral abdominal wall hernias. Musculoskeletal: Left iliac sclerosis is unchanged including on image 101/series 2. IMPRESSION: 1. Further decrease in size of soft tissue density lesion in the left suprahilar region with extension into the AP window. 2. Worsened left-sided aeration with new increased ground-glass and airspace opacities. Likely radiation induced. 3. New small left and trace right pleural effusions. 4. Bilateral partially  calcified pleural plaques, consistent with asbestos related pleural disease. 5. Mild cirrhosis. 6. Coronary artery atherosclerosis. Aortic Atherosclerosis (ICD10-I70.0). 7. Similar vague left-sided iliac sclerosis, indeterminate. 8. Pulmonary artery enlargement suggests pulmonary arterial hypertension. Electronically Signed   By: KAbigail MiyamotoM.D.   On: 02/06/2017 09:32   Ct Abdomen Pelvis W Contrast  Result Date: 02/06/2017 CLINICAL DATA:  CLINICAL DATA Lung cancer diagnosed earlier this year. Chemotherapy in progress. Radiation therapy complete. Asymptomatic. Small-cell, left-sided. EXAM: CT CHEST, ABDOMEN, AND PELVIS WITH CONTRAST TECHNIQUE: Multidetector CT imaging of the chest, abdomen and pelvis was performed following the standard protocol during bolus administration of intravenous contrast. CONTRAST:  109mISOVUE-300 IOPAMIDOL (ISOVUE-300) INJECTION 61% COMPARISON:  12/16/2016 and chest radiograph of 02/02/2017. FINDINGS: CT CHEST FINDINGS Cardiovascular: A right-sided Port-A-Cath which terminates at the superior caval/atrial junction. Advanced aortic and branch vessel atherosclerosis. Mild cardiomegaly, without pericardial effusion. Multivessel coronary artery atherosclerosis. No central pulmonary embolism, on this non-dedicated study. Pulmonary artery enlargement, outflow tract 3.7 cm. Mediastinum/Nodes: No supraclavicular adenopathy. No separate areas of mediastinal adenopathy. No hilar adenopathy. Lungs/Pleura: Bilateral calcified pleural plaques. Moderate left pleural effusion is new. Trace right pleural fluid is also new. Similar left upper lobe endobronchial narrowing. Mild centrilobular and paraseptal emphysema. Left suprahilar soft tissue density with extension into the AP window region. Example  4.4 x 3.3 cm on image 20/series 2. Compare 5.4 x 3.8 cm on the prior exam. Cephalad pulmonary parenchymal component measures 2.1 cm on image 41/series 4 versus 2.3 cm at the same level on the prior.  Increased and new left upper and superior segment left lower lobe ground-glass and airspace opacities with areas of septal thickening. Musculoskeletal: No acute osseous abnormality. CT ABDOMEN PELVIS FINDINGS Hepatobiliary: Caudate and lateral segment left liver lobe enlargement. No focal liver lesion. Normal gallbladder, without biliary ductal dilatation. Pancreas: Normal, without mass or ductal dilatation. Spleen: Normal in size, without focal abnormality. Adrenals/Urinary Tract: Normal adrenal glands. Normal kidneys, without hydronephrosis. Normal urinary bladder. Stomach/Bowel: Normal stomach, without wall thickening. Extensive colonic diverticulosis. Normal terminal ileum and appendix. Normal small bowel Vascular/Lymphatic: Advanced aortic and branch vessel atherosclerosis. No abdominopelvic adenopathy. Reproductive: Normal prostate. Other: No significant free fluid. No evidence of omental or peritoneal disease. Tiny fat containing left inguinal and periumbilical ventral abdominal wall hernias. Musculoskeletal: Left iliac sclerosis is unchanged including on image 101/series 2. IMPRESSION: 1. Further decrease in size of soft tissue density lesion in the left suprahilar region with extension into the AP window. 2. Worsened left-sided aeration with new increased ground-glass and airspace opacities. Likely radiation induced. 3. New small left and trace right pleural effusions. 4. Bilateral partially calcified pleural plaques, consistent with asbestos related pleural disease. 5. Mild cirrhosis. 6. Coronary artery atherosclerosis. Aortic Atherosclerosis (ICD10-I70.0). 7. Similar vague left-sided iliac sclerosis, indeterminate. 8. Pulmonary artery enlargement suggests pulmonary arterial hypertension. Electronically Signed   By: Abigail Miyamoto M.D.   On: 02/06/2017 09:32    Impression:  The patient has completed his chemotherapy and showed excellent response to treatment. He is likely in remission. The patient was  referred for consideration of prophylactic cranial irradiation. He would be a good candidate for this therapy with his excellent performance status and young age. Today I reviewed the findings and workup thus far.  We discussed the natural history of small cell lung cancer.  We reviewed the the implications of stage, small cell cancer.  We discussed radiation treatment in the management of lung cancer with regard to the logistics and delivery of external beam radiation treatment.  The patient expressed interest in brain radiation.  A consent form was discussed and signed today.  Plan:  Patient will have a brain MRI on 02/26/17, and soon after that study he will proceed with radiation to the brain. If no metastasis is seen, then he will receive a prophylactic dose of 25 Gy in 10 fractions. If his scan shows metastasis, then he'll proceed to 35 Gy in 14 fractions. Simulation is scheduled for 03/02/17.  -----------------------------------  Blair Promise, PhD, MD  This document serves as a record of services personally performed by Gery Pray, MD. It was created on his behalf by Maryla Morrow, a trained medical scribe. The creation of this record is based on the scribe's personal observations and the provider's statements to them. This document has been checked and approved by the attending provider.

## 2017-02-23 ENCOUNTER — Ambulatory Visit (HOSPITAL_COMMUNITY)
Admission: RE | Admit: 2017-02-23 | Discharge: 2017-02-23 | Disposition: A | Discharge status: Home / Self Care | Payer: BLUE CROSS/BLUE SHIELD | Source: Ambulatory Visit | Attending: Radiation Oncology | Admitting: Radiation Oncology

## 2017-02-23 ENCOUNTER — Telehealth: Payer: Self-pay | Admitting: *Deleted

## 2017-02-23 DIAGNOSIS — C3492 Malignant neoplasm of unspecified part of left bronchus or lung: Secondary | ICD-10-CM | POA: Diagnosis not present

## 2017-02-23 DIAGNOSIS — I639 Cerebral infarction, unspecified: Secondary | ICD-10-CM | POA: Diagnosis not present

## 2017-02-23 MED ORDER — GADOBENATE DIMEGLUMINE 529 MG/ML IV SOLN
20.0000 mL | Freq: Once | INTRAVENOUS | Status: AC | PRN
  Administered 2017-02-23: 18 mL via INTRAVENOUS

## 2017-02-23 NOTE — Telephone Encounter (Signed)
CALLED PATIENT TO INFORM OF MRI FOR 02-24-17 - ARRIVAL TIME - 4:45 PM @ [facility], SPOKE WITH PATIENT'S WIFE- SHERRY AND SHE IS AWARE OF THIS TEST

## 2017-02-24 ENCOUNTER — Telehealth: Payer: Self-pay | Admitting: Oncology

## 2017-02-24 ENCOUNTER — Ambulatory Visit
Admission: RE | Admit: 2017-02-24 | Discharge: 2017-02-24 | Disposition: A | Discharge status: Home / Self Care | Payer: BLUE CROSS/BLUE SHIELD | Source: Ambulatory Visit | Attending: Radiation Oncology | Admitting: Radiation Oncology

## 2017-02-24 ENCOUNTER — Ambulatory Visit (HOSPITAL_COMMUNITY): Payer: BLUE CROSS/BLUE SHIELD

## 2017-02-24 DIAGNOSIS — Z510 Encounter for antineoplastic radiation therapy: Secondary | ICD-10-CM | POA: Diagnosis not present

## 2017-02-24 DIAGNOSIS — C3492 Malignant neoplasm of unspecified part of left bronchus or lung: Secondary | ICD-10-CM

## 2017-02-24 NOTE — Telephone Encounter (Signed)
Lamount called back and said he could come in at 2 this afternoon for CT Sim.  Also advised him of the good results on his brain MRI from today per Dr. Sondra Come.

## 2017-02-24 NOTE — Telephone Encounter (Signed)
Left a message for patient asking if he would be able to come in for simulation at 2 or 3 pm today.  Requested a return call.

## 2017-02-24 NOTE — Progress Notes (Signed)
  Radiation Oncology         (336) [phone] ________________________________  Name: OSAMAH SCHMADER MRN: [id number]  Date: 02/24/2017  DOB: [birth year]  SIMULATION AND TREATMENT PLANNING NOTE    ICD-10-CM   1. 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  Identity was confirmed.  All relevant records and images related to the planned course of therapy were reviewed.  The patient freely provided informed written consent to proceed with treatment after reviewing the details related to the planned course of therapy. The consent form was witnessed and verified by the simulation staff.  Then, the patient was set-up in a stable reproducible  supine position for radiation therapy.  CT images were obtained.  Surface markings were placed.  The CT images were loaded into the planning software.  Then the target and avoidance structures were contoured.  Treatment planning then occurred.  The radiation prescription was entered and confirmed.  Then, I designed and supervised the construction of a total of 3 medically necessary complex treatment devices.  I have requested : Isodose Plan.  I have ordered:dose calc.  PLAN:  The patient will receive 25 Gy in 10 fractions Directed at the whole brain as prophylactic treatment.  -----------------------------------  Blair Promise, PhD, MD

## 2017-02-25 DIAGNOSIS — Z510 Encounter for antineoplastic radiation therapy: Secondary | ICD-10-CM | POA: Diagnosis not present

## 2017-02-26 ENCOUNTER — Telehealth: Payer: Self-pay | Admitting: Oncology

## 2017-02-26 NOTE — Telephone Encounter (Signed)
Shanda from Dr. Charlie Pitter office called asking if it is OK for Jesse Avila to have a tootle extraction.  She would need a letter faxed to [phone].  Faxed form signed by Dr. Sondra Come and also called Mingo Amber back advising her that it was OK for Jesse Avila to have the extraction.

## 2017-03-01 ENCOUNTER — Encounter (HOSPITAL_COMMUNITY): Payer: Self-pay

## 2017-03-01 ENCOUNTER — Inpatient Hospital Stay (HOSPITAL_COMMUNITY): Payer: BLUE CROSS/BLUE SHIELD

## 2017-03-01 ENCOUNTER — Emergency Department (HOSPITAL_COMMUNITY): Payer: BLUE CROSS/BLUE SHIELD

## 2017-03-01 ENCOUNTER — Inpatient Hospital Stay (HOSPITAL_COMMUNITY)
Admission: EM | Admit: 2017-03-01 | Discharge: 2017-03-10 | DRG: 291 | Disposition: A | Discharge status: Home / Self Care | Payer: BLUE CROSS/BLUE SHIELD | Attending: Internal Medicine | Admitting: Internal Medicine

## 2017-03-01 DIAGNOSIS — Z79899 Other long term (current) drug therapy: Secondary | ICD-10-CM | POA: Diagnosis not present

## 2017-03-01 DIAGNOSIS — J9601 Acute respiratory failure with hypoxia: Secondary | ICD-10-CM | POA: Diagnosis present

## 2017-03-01 DIAGNOSIS — J441 Chronic obstructive pulmonary disease with (acute) exacerbation: Secondary | ICD-10-CM | POA: Diagnosis not present

## 2017-03-01 DIAGNOSIS — I340 Nonrheumatic mitral (valve) insufficiency: Secondary | ICD-10-CM | POA: Diagnosis not present

## 2017-03-01 DIAGNOSIS — I236 Thrombosis of atrium, auricular appendage, and ventricle as current complications following acute myocardial infarction: Secondary | ICD-10-CM

## 2017-03-01 DIAGNOSIS — D539 Nutritional anemia, unspecified: Secondary | ICD-10-CM | POA: Diagnosis not present

## 2017-03-01 DIAGNOSIS — E872 Acidosis, unspecified: Secondary | ICD-10-CM | POA: Diagnosis present

## 2017-03-01 DIAGNOSIS — I429 Cardiomyopathy, unspecified: Secondary | ICD-10-CM | POA: Diagnosis present

## 2017-03-01 DIAGNOSIS — I5043 Acute on chronic combined systolic (congestive) and diastolic (congestive) heart failure: Secondary | ICD-10-CM | POA: Diagnosis present

## 2017-03-01 DIAGNOSIS — I513 Intracardiac thrombosis, not elsewhere classified: Secondary | ICD-10-CM | POA: Diagnosis present

## 2017-03-01 DIAGNOSIS — R791 Abnormal coagulation profile: Secondary | ICD-10-CM | POA: Diagnosis present

## 2017-03-01 DIAGNOSIS — C3412 Malignant neoplasm of upper lobe, left bronchus or lung: Secondary | ICD-10-CM | POA: Diagnosis present

## 2017-03-01 DIAGNOSIS — E1165 Type 2 diabetes mellitus with hyperglycemia: Secondary | ICD-10-CM | POA: Diagnosis present

## 2017-03-01 DIAGNOSIS — E119 Type 2 diabetes mellitus without complications: Secondary | ICD-10-CM

## 2017-03-01 DIAGNOSIS — I5021 Acute systolic (congestive) heart failure: Secondary | ICD-10-CM | POA: Diagnosis not present

## 2017-03-01 DIAGNOSIS — E11649 Type 2 diabetes mellitus with hypoglycemia without coma: Secondary | ICD-10-CM | POA: Diagnosis present

## 2017-03-01 DIAGNOSIS — E785 Hyperlipidemia, unspecified: Secondary | ICD-10-CM | POA: Diagnosis present

## 2017-03-01 DIAGNOSIS — J918 Pleural effusion in other conditions classified elsewhere: Secondary | ICD-10-CM | POA: Diagnosis present

## 2017-03-01 DIAGNOSIS — Z8349 Family history of other endocrine, nutritional and metabolic diseases: Secondary | ICD-10-CM

## 2017-03-01 DIAGNOSIS — I4891 Unspecified atrial fibrillation: Secondary | ICD-10-CM

## 2017-03-01 DIAGNOSIS — R740 Nonspecific elevation of levels of transaminase and lactic acid dehydrogenase [LDH]: Secondary | ICD-10-CM | POA: Diagnosis not present

## 2017-03-01 DIAGNOSIS — Z8673 Personal history of transient ischemic attack (TIA), and cerebral infarction without residual deficits: Secondary | ICD-10-CM

## 2017-03-01 DIAGNOSIS — J189 Pneumonia, unspecified organism: Secondary | ICD-10-CM | POA: Diagnosis present

## 2017-03-01 DIAGNOSIS — R7401 Elevation of levels of liver transaminase levels: Secondary | ICD-10-CM

## 2017-03-01 DIAGNOSIS — Z87891 Personal history of nicotine dependence: Secondary | ICD-10-CM | POA: Diagnosis not present

## 2017-03-01 DIAGNOSIS — C781 Secondary malignant neoplasm of mediastinum: Secondary | ICD-10-CM | POA: Diagnosis present

## 2017-03-01 DIAGNOSIS — Z923 Personal history of irradiation: Secondary | ICD-10-CM

## 2017-03-01 DIAGNOSIS — I509 Heart failure, unspecified: Secondary | ICD-10-CM

## 2017-03-01 DIAGNOSIS — Z510 Encounter for antineoplastic radiation therapy: Secondary | ICD-10-CM | POA: Diagnosis present

## 2017-03-01 DIAGNOSIS — N289 Disorder of kidney and ureter, unspecified: Secondary | ICD-10-CM | POA: Diagnosis present

## 2017-03-01 DIAGNOSIS — I248 Other forms of acute ischemic heart disease: Secondary | ICD-10-CM | POA: Diagnosis present

## 2017-03-01 DIAGNOSIS — I5023 Acute on chronic systolic (congestive) heart failure: Secondary | ICD-10-CM | POA: Diagnosis not present

## 2017-03-01 DIAGNOSIS — Z9889 Other specified postprocedural states: Secondary | ICD-10-CM | POA: Diagnosis not present

## 2017-03-01 DIAGNOSIS — Z8249 Family history of ischemic heart disease and other diseases of the circulatory system: Secondary | ICD-10-CM

## 2017-03-01 DIAGNOSIS — Z7984 Long term (current) use of oral hypoglycemic drugs: Secondary | ICD-10-CM | POA: Diagnosis not present

## 2017-03-01 DIAGNOSIS — J90 Pleural effusion, not elsewhere classified: Secondary | ICD-10-CM | POA: Diagnosis not present

## 2017-03-01 DIAGNOSIS — N179 Acute kidney failure, unspecified: Secondary | ICD-10-CM | POA: Diagnosis present

## 2017-03-01 DIAGNOSIS — C3492 Malignant neoplasm of unspecified part of left bronchus or lung: Secondary | ICD-10-CM | POA: Diagnosis present

## 2017-03-01 DIAGNOSIS — Z96652 Presence of left artificial knee joint: Secondary | ICD-10-CM | POA: Diagnosis present

## 2017-03-01 DIAGNOSIS — C7951 Secondary malignant neoplasm of bone: Secondary | ICD-10-CM | POA: Diagnosis present

## 2017-03-01 DIAGNOSIS — E1159 Type 2 diabetes mellitus with other circulatory complications: Secondary | ICD-10-CM

## 2017-03-01 DIAGNOSIS — E1151 Type 2 diabetes mellitus with diabetic peripheral angiopathy without gangrene: Secondary | ICD-10-CM | POA: Diagnosis present

## 2017-03-01 DIAGNOSIS — J440 Chronic obstructive pulmonary disease with acute lower respiratory infection: Secondary | ICD-10-CM | POA: Diagnosis present

## 2017-03-01 DIAGNOSIS — I481 Persistent atrial fibrillation: Secondary | ICD-10-CM | POA: Diagnosis not present

## 2017-03-01 DIAGNOSIS — Z7902 Long term (current) use of antithrombotics/antiplatelets: Secondary | ICD-10-CM

## 2017-03-01 DIAGNOSIS — J449 Chronic obstructive pulmonary disease, unspecified: Secondary | ICD-10-CM | POA: Diagnosis present

## 2017-03-01 DIAGNOSIS — I110 Hypertensive heart disease with heart failure: Secondary | ICD-10-CM | POA: Diagnosis present

## 2017-03-01 DIAGNOSIS — Z823 Family history of stroke: Secondary | ICD-10-CM

## 2017-03-01 DIAGNOSIS — I5033 Acute on chronic diastolic (congestive) heart failure: Secondary | ICD-10-CM | POA: Diagnosis not present

## 2017-03-01 DIAGNOSIS — I5032 Chronic diastolic (congestive) heart failure: Secondary | ICD-10-CM | POA: Diagnosis present

## 2017-03-01 DIAGNOSIS — R0602 Shortness of breath: Secondary | ICD-10-CM

## 2017-03-01 DIAGNOSIS — Z7901 Long term (current) use of anticoagulants: Secondary | ICD-10-CM

## 2017-03-01 DIAGNOSIS — Z86010 Personal history of colonic polyps: Secondary | ICD-10-CM

## 2017-03-01 DIAGNOSIS — Y95 Nosocomial condition: Secondary | ICD-10-CM | POA: Diagnosis present

## 2017-03-01 DIAGNOSIS — Q211 Atrial septal defect: Secondary | ICD-10-CM

## 2017-03-01 DIAGNOSIS — Z888 Allergy status to other drugs, medicaments and biological substances status: Secondary | ICD-10-CM | POA: Diagnosis not present

## 2017-03-01 DIAGNOSIS — Z833 Family history of diabetes mellitus: Secondary | ICD-10-CM

## 2017-03-01 HISTORY — DX: Unspecified atrial fibrillation: I48.91

## 2017-03-01 LAB — BLOOD GAS, ARTERIAL
ACID-BASE DEFICIT: 4.7 mmol/L — AB (ref 0.0–2.0)
Bicarbonate: 18.6 mmol/L — ABNORMAL LOW (ref 20.0–28.0)
DELIVERY SYSTEMS: POSITIVE
DRAWN BY: 232811
Expiratory PAP: 6
FIO2: 60
INSPIRATORY PAP: 12
O2 SAT: 98.6 %
PCO2 ART: 28.4 mmHg — AB (ref 32.0–48.0)
PH ART: 7.427 (ref 7.350–7.450)
PO2 ART: 133 mmHg — AB (ref 83.0–108.0)
Patient temperature: 97.1

## 2017-03-01 LAB — GLUCOSE, CAPILLARY
GLUCOSE-CAPILLARY: 196 mg/dL — AB (ref 65–99)
GLUCOSE-CAPILLARY: 244 mg/dL — AB (ref 65–99)
Glucose-Capillary: 260 mg/dL — ABNORMAL HIGH (ref 65–99)
Glucose-Capillary: 277 mg/dL — ABNORMAL HIGH (ref 65–99)

## 2017-03-01 LAB — URINALYSIS, ROUTINE W REFLEX MICROSCOPIC
BILIRUBIN URINE: NEGATIVE
Bacteria, UA: NONE SEEN
GLUCOSE, UA: NEGATIVE mg/dL
Ketones, ur: NEGATIVE mg/dL
Leukocytes, UA: NEGATIVE
NITRITE: NEGATIVE
PH: 5 (ref 5.0–8.0)
Protein, ur: 100 mg/dL — AB
SPECIFIC GRAVITY, URINE: 1.015 (ref 1.005–1.030)

## 2017-03-01 LAB — DIFFERENTIAL
BASOS PCT: 0 %
Basophils Absolute: 0 10*3/uL (ref 0.0–0.1)
EOS PCT: 0 %
Eosinophils Absolute: 0 10*3/uL (ref 0.0–0.7)
Lymphocytes Relative: 9 %
Lymphs Abs: 0.7 10*3/uL (ref 0.7–4.0)
MONO ABS: 0.8 10*3/uL (ref 0.1–1.0)
MONOS PCT: 9 %
NEUTROS ABS: 7.2 10*3/uL (ref 1.7–7.7)
Neutrophils Relative %: 82 %

## 2017-03-01 LAB — CBC
HEMATOCRIT: 27.3 % — AB (ref 39.0–52.0)
HEMOGLOBIN: 8.8 g/dL — AB (ref 13.0–17.0)
MCH: 32.6 pg (ref 26.0–34.0)
MCHC: 32.2 g/dL (ref 30.0–36.0)
MCV: 101.1 fL — AB (ref 78.0–100.0)
Platelets: 135 10*3/uL — ABNORMAL LOW (ref 150–400)
RBC: 2.7 MIL/uL — ABNORMAL LOW (ref 4.22–5.81)
RDW: 20.8 % — AB (ref 11.5–15.5)
WBC: 8.8 10*3/uL (ref 4.0–10.5)

## 2017-03-01 LAB — BASIC METABOLIC PANEL
ANION GAP: 14 (ref 5–15)
Anion gap: 11 (ref 5–15)
BUN: 27 mg/dL — AB (ref 6–20)
BUN: 30 mg/dL — ABNORMAL HIGH (ref 6–20)
CALCIUM: 8.9 mg/dL (ref 8.9–10.3)
CO2: 18 mmol/L — AB (ref 22–32)
CO2: 22 mmol/L (ref 22–32)
CREATININE: 1.28 mg/dL — AB (ref 0.61–1.24)
Calcium: 9 mg/dL (ref 8.9–10.3)
Chloride: 104 mmol/L (ref 101–111)
Chloride: 102 mmol/L (ref 101–111)
Creatinine, Ser: 1.36 mg/dL — ABNORMAL HIGH (ref 0.61–1.24)
GFR calc Af Amer: >60 mL/min (ref >60)
GFR calc non Af Amer: 54 mL/min — ABNORMAL LOW (ref >60)
GFR calc non Af Amer: 58 mL/min — ABNORMAL LOW (ref >60)
GLUCOSE: 205 mg/dL — AB (ref 65–99)
Glucose, Bld: 301 mg/dL — ABNORMAL HIGH (ref 65–99)
Potassium: 3.6 mmol/L (ref 3.5–5.1)
Potassium: 3.2 mmol/L — ABNORMAL LOW (ref 3.5–5.1)
Sodium: 136 mmol/L (ref 135–145)
Sodium: 135 mmol/L (ref 135–145)

## 2017-03-01 LAB — TROPONIN I
TROPONIN I: 0.23 ng/mL — AB (ref <0.03)
Troponin I: 0.14 ng/mL (ref <0.03)
Troponin I: 0.4 ng/mL (ref <0.03)
Troponin I: 0.36 ng/mL (ref <0.03)

## 2017-03-01 LAB — LACTIC ACID, PLASMA
LACTIC ACID, VENOUS: 3.7 mmol/L — AB (ref 0.5–1.9)
LACTIC ACID, VENOUS: 2.2 mmol/L — AB (ref 0.5–1.9)
LACTIC ACID, VENOUS: 2.7 mmol/L — AB (ref 0.5–1.9)

## 2017-03-01 LAB — D-DIMER, QUANTITATIVE (NOT AT ARMC): D DIMER QUANT: 4.06 ug{FEU}/mL — AB (ref 0.00–0.50)

## 2017-03-01 LAB — MRSA PCR SCREENING: MRSA by PCR: NEGATIVE

## 2017-03-01 LAB — CBG MONITORING, ED: GLUCOSE-CAPILLARY: 190 mg/dL — AB (ref 65–99)

## 2017-03-01 LAB — BRAIN NATRIURETIC PEPTIDE: B NATRIURETIC PEPTIDE 5: 503.1 pg/mL — AB (ref 0.0–100.0)

## 2017-03-01 LAB — HIV ANTIBODY (ROUTINE TESTING W REFLEX): HIV SCREEN 4TH GENERATION: NONREACTIVE

## 2017-03-01 LAB — STREP PNEUMONIAE URINARY ANTIGEN: STREP PNEUMO URINARY ANTIGEN: NEGATIVE

## 2017-03-01 MED ORDER — METHYLPREDNISOLONE SODIUM SUCC 125 MG IJ SOLR
60.0000 mg | Freq: Two times a day (BID) | INTRAMUSCULAR | Status: DC
  Administered 2017-03-01: 60 mg via INTRAVENOUS

## 2017-03-01 MED ORDER — CHLORHEXIDINE GLUCONATE CLOTH 2 % EX PADS
6.0000 | MEDICATED_PAD | Freq: Every day | CUTANEOUS | Status: DC
  Administered 2017-03-01 – 2017-03-09 (×7): 6 via TOPICAL

## 2017-03-01 MED ORDER — DILTIAZEM HCL-DEXTROSE 100-5 MG/100ML-% IV SOLN (PREMIX)
5.0000 mg/h | INTRAVENOUS | Status: AC
  Administered 2017-03-01: 15 mg/h via INTRAVENOUS
  Administered 2017-03-01: 5 mg/h via INTRAVENOUS
  Filled 2017-03-01 (×2): qty 100

## 2017-03-01 MED ORDER — ZOLPIDEM TARTRATE 10 MG PO TABS
10.0000 mg | ORAL_TABLET | Freq: Every evening | ORAL | Status: DC | PRN
  Administered 2017-03-01 – 2017-03-06 (×6): 10 mg via ORAL
  Filled 2017-03-01 (×6): qty 1

## 2017-03-01 MED ORDER — ALBUTEROL SULFATE (2.5 MG/3ML) 0.083% IN NEBU
5.0000 mg | INHALATION_SOLUTION | Freq: Once | RESPIRATORY_TRACT | Status: AC
  Administered 2017-03-01: 5 mg via RESPIRATORY_TRACT

## 2017-03-01 MED ORDER — FUROSEMIDE 10 MG/ML IJ SOLN
20.0000 mg | Freq: Every day | INTRAMUSCULAR | Status: DC
  Administered 2017-03-01 – 2017-03-02 (×2): 20 mg via INTRAVENOUS
  Filled 2017-03-01 (×2): qty 2

## 2017-03-01 MED ORDER — ROSUVASTATIN CALCIUM 20 MG PO TABS
40.0000 mg | ORAL_TABLET | Freq: Every evening | ORAL | Status: DC
  Administered 2017-03-01 – 2017-03-02 (×2): 40 mg via ORAL
  Filled 2017-03-01: qty 2
  Filled 2017-03-01: qty 1
  Filled 2017-03-01: qty 2

## 2017-03-01 MED ORDER — IPRATROPIUM BROMIDE 0.02 % IN SOLN
0.5000 mg | Freq: Four times a day (QID) | RESPIRATORY_TRACT | Status: DC
  Administered 2017-03-02 (×4): 0.5 mg via RESPIRATORY_TRACT
  Filled 2017-03-01 (×4): qty 2.5

## 2017-03-01 MED ORDER — AMIODARONE LOAD VIA INFUSION
150.0000 mg | Freq: Once | INTRAVENOUS | Status: AC
  Administered 2017-03-01: 150 mg via INTRAVENOUS
  Filled 2017-03-01: qty 83.34

## 2017-03-01 MED ORDER — METHYLPREDNISOLONE SODIUM SUCC 125 MG IJ SOLR
60.0000 mg | Freq: Two times a day (BID) | INTRAMUSCULAR | Status: DC
  Administered 2017-03-01 – 2017-03-06 (×10): 60 mg via INTRAVENOUS
  Filled 2017-03-01 (×10): qty 2

## 2017-03-01 MED ORDER — VANCOMYCIN HCL IN DEXTROSE 1-5 GM/200ML-% IV SOLN
1000.0000 mg | Freq: Two times a day (BID) | INTRAVENOUS | Status: DC
  Administered 2017-03-01 – 2017-03-02 (×2): 1000 mg via INTRAVENOUS
  Filled 2017-03-01 (×2): qty 200

## 2017-03-01 MED ORDER — FENOFIBRATE 160 MG PO TABS
160.0000 mg | ORAL_TABLET | Freq: Every day | ORAL | Status: DC
  Administered 2017-03-01 – 2017-03-03 (×3): 160 mg via ORAL
  Filled 2017-03-01 (×3): qty 1

## 2017-03-01 MED ORDER — AMIODARONE HCL IN DEXTROSE 360-4.14 MG/200ML-% IV SOLN
30.0000 mg/h | INTRAVENOUS | Status: DC
  Administered 2017-03-02 – 2017-03-06 (×9): 30 mg/h via INTRAVENOUS
  Filled 2017-03-01 (×8): qty 200

## 2017-03-01 MED ORDER — PIPERACILLIN-TAZOBACTAM 3.375 G IVPB
3.3750 g | Freq: Three times a day (TID) | INTRAVENOUS | Status: AC
Start: 2017-03-01 — End: 2017-03-06
  Administered 2017-03-01 – 2017-03-06 (×15): 3.375 g via INTRAVENOUS
  Filled 2017-03-01 (×16): qty 50

## 2017-03-01 MED ORDER — ALBUTEROL SULFATE (2.5 MG/3ML) 0.083% IN NEBU
INHALATION_SOLUTION | RESPIRATORY_TRACT | Status: AC
  Filled 2017-03-01: qty 6

## 2017-03-01 MED ORDER — APIXABAN 5 MG PO TABS
5.0000 mg | ORAL_TABLET | Freq: Two times a day (BID) | ORAL | Status: DC
  Administered 2017-03-01 – 2017-03-02 (×2): 5 mg via ORAL
  Filled 2017-03-01 (×3): qty 1

## 2017-03-01 MED ORDER — DEXTROSE 5 % IV SOLN
2.0000 g | Freq: Three times a day (TID) | INTRAVENOUS | Status: DC
  Administered 2017-03-01: 2 g via INTRAVENOUS
  Filled 2017-03-01 (×2): qty 2

## 2017-03-01 MED ORDER — DILTIAZEM LOAD VIA INFUSION
10.0000 mg | Freq: Once | INTRAVENOUS | Status: AC
  Administered 2017-03-01: 10 mg via INTRAVENOUS
  Filled 2017-03-01: qty 10

## 2017-03-01 MED ORDER — VANCOMYCIN HCL IN DEXTROSE 1-5 GM/200ML-% IV SOLN
1000.0000 mg | Freq: Once | INTRAVENOUS | Status: AC
  Administered 2017-03-01: 1000 mg via INTRAVENOUS
  Filled 2017-03-01: qty 200

## 2017-03-01 MED ORDER — CEFEPIME HCL 2 G IJ SOLR
2.0000 g | Freq: Once | INTRAMUSCULAR | Status: AC
  Administered 2017-03-01: 2 g via INTRAVENOUS
  Filled 2017-03-01: qty 2

## 2017-03-01 MED ORDER — GUAIFENESIN 100 MG/5ML PO SOLN: 200.0000 mg | Freq: Three times a day (TID) | ORAL | Status: DC | PRN

## 2017-03-01 MED ORDER — AMIODARONE HCL IN DEXTROSE 360-4.14 MG/200ML-% IV SOLN
60.0000 mg/h | INTRAVENOUS | Status: AC
  Administered 2017-03-01 (×2): 60 mg/h via INTRAVENOUS
  Filled 2017-03-01 (×3): qty 200

## 2017-03-01 MED ORDER — FUROSEMIDE 10 MG/ML IJ SOLN
40.0000 mg | Freq: Once | INTRAMUSCULAR | Status: AC
  Administered 2017-03-01: 40 mg via INTRAVENOUS
  Filled 2017-03-01: qty 4

## 2017-03-01 MED ORDER — IPRATROPIUM BROMIDE 0.02 % IN SOLN
0.5000 mg | RESPIRATORY_TRACT | Status: DC
  Administered 2017-03-01 (×4): 0.5 mg via RESPIRATORY_TRACT
  Filled 2017-03-01 (×4): qty 2.5

## 2017-03-01 MED ORDER — FAMOTIDINE 20 MG PO TABS: 20.0000 mg | ORAL_TABLET | Freq: Every day | ORAL | Status: DC | PRN

## 2017-03-01 MED ORDER — INSULIN ASPART 100 UNIT/ML ~~LOC~~ SOLN
0.0000 [IU] | Freq: Three times a day (TID) | SUBCUTANEOUS | Status: DC
  Administered 2017-03-01 (×2): 8 [IU] via SUBCUTANEOUS
  Administered 2017-03-01: 3 [IU] via SUBCUTANEOUS
  Administered 2017-03-02 (×2): 8 [IU] via SUBCUTANEOUS
  Administered 2017-03-02: 5 [IU] via SUBCUTANEOUS
  Administered 2017-03-03: 8 [IU] via SUBCUTANEOUS
  Administered 2017-03-03: 11 [IU] via SUBCUTANEOUS
  Administered 2017-03-03 – 2017-03-04 (×2): 15 [IU] via SUBCUTANEOUS

## 2017-03-01 MED ORDER — TECHNETIUM TO 99M ALBUMIN AGGREGATED
4.2000 | Freq: Once | INTRAVENOUS | Status: AC | PRN
  Administered 2017-03-01: 4.2 via INTRAVENOUS

## 2017-03-01 MED ORDER — DILTIAZEM HCL 25 MG/5ML IV SOLN: 10.0000 mg | Freq: Once | INTRAVENOUS | Status: DC

## 2017-03-01 MED ORDER — METHYLPREDNISOLONE SODIUM SUCC 40 MG IJ SOLR
INTRAMUSCULAR | Status: AC
  Filled 2017-03-01: qty 2

## 2017-03-01 MED ORDER — ONDANSETRON HCL 4 MG PO TABS
4.0000 mg | ORAL_TABLET | Freq: Three times a day (TID) | ORAL | Status: DC | PRN
  Administered 2017-03-09: 4 mg via ORAL
  Filled 2017-03-01: qty 1

## 2017-03-01 MED ORDER — DILTIAZEM HCL ER COATED BEADS 120 MG PO CP24
120.0000 mg | ORAL_CAPSULE | Freq: Every day | ORAL | Status: DC
  Administered 2017-03-01 – 2017-03-03 (×3): 120 mg via ORAL
  Filled 2017-03-01 (×3): qty 1

## 2017-03-01 MED ORDER — LEVALBUTEROL HCL 1.25 MG/0.5ML IN NEBU
1.2500 mg | INHALATION_SOLUTION | RESPIRATORY_TRACT | Status: DC
  Administered 2017-03-01 (×4): 1.25 mg via RESPIRATORY_TRACT
  Filled 2017-03-01 (×4): qty 0.5

## 2017-03-01 MED ORDER — METOPROLOL SUCCINATE ER 100 MG PO TB24
100.0000 mg | ORAL_TABLET | Freq: Every day | ORAL | Status: DC
  Administered 2017-03-01 – 2017-03-03 (×3): 100 mg via ORAL
  Filled 2017-03-01 (×3): qty 1

## 2017-03-01 MED ORDER — LEVALBUTEROL HCL 1.25 MG/0.5ML IN NEBU
1.2500 mg | INHALATION_SOLUTION | Freq: Four times a day (QID) | RESPIRATORY_TRACT | Status: DC
  Administered 2017-03-02 (×4): 1.25 mg via RESPIRATORY_TRACT
  Filled 2017-03-01 (×4): qty 0.5

## 2017-03-01 MED ORDER — TECHNETIUM TC 99M DIETHYLENETRIAME-PENTAACETIC ACID: 33.0000 | Freq: Once | INTRAVENOUS | Status: DC | PRN

## 2017-03-01 NOTE — ED Triage Notes (Signed)
States shortness of breath for about 4 weeks and getting worse hard to move around due to shortness of breath and weakness voiced.

## 2017-03-01 NOTE — ED Notes (Signed)
NOTE:  ED Notes at 0345 was wrong entry.

## 2017-03-01 NOTE — Progress Notes (Signed)
Pt transported from ED to ICU room 1225 on bipap 60% fio2.  Pt tolerated trip well without incident.

## 2017-03-01 NOTE — ED Notes (Signed)
Pt not able unriate at this time will try later

## 2017-03-01 NOTE — ED Notes (Signed)
Pulse ox 89% when moving placed on O2 2L sats then 95%.

## 2017-03-01 NOTE — ED Notes (Signed)
Date and time results received: 03/01/17 0302   Test: troponin Critical Value: 0.14  Name of Provider Notified: Roxanne Mins  Orders Received? Or Actions Taken?: none

## 2017-03-01 NOTE — Progress Notes (Signed)
Pharmacy Antibiotic Note  Jesse Avila is a 66 y.o. male admitted on 03/01/2017 with post-obstructive pneumonia.  Pharmacy has been consulted for Vancomycin & Zosyn dosing.  Plan: Vancomycin total 2gm initially, then 1gm IV every q12 hours.  Goal trough 15-20 mcg/mL.  Zosyn 3.375gm IV Q8h to be infused over 4hrs Daily SCreatinine Monitor renal function and cx data  Check Vancomycin trough at steady state  Height: 5\' 11"  (180.3 cm) Weight: 220 lb (99.8 kg) IBW/kg (Calculated) : 75.3  Temp (24hrs), Avg:98.1 F (36.7 C), Min:97.4 F (36.3 C), Max:99.2 F (37.3 C)   Recent Labs Lab 03/01/17 0117 03/01/17 0452 03/01/17 0757  WBC 8.8  --   --   CREATININE 1.36*  --   --   LATICACIDVEN  --  3.7* 2.2*    Estimated Creatinine Clearance: 66.9 mL/min (A) (by C-G formula based on SCr of 1.36 mg/dL (H)).    Allergies  Allergen Reactions  . Cialis [Tadalafil] Other (See Comments)    Reaction:  Headache    Antimicrobials this admission: 7/29 Vancomycin >>  7/29 Cefepime >>7/29 7/29 Zosyn>>  Dose adjustments this admission:  Microbiology results: 7/29 BCx: obtained after abx given  Thank you for allowing pharmacy to be a part of this patient's care.  Netta Cedars, PharmD, BCPS Pager: [phone] 03/01/2017, 2:49 PM

## 2017-03-01 NOTE — H&P (Signed)
History and Physical    GIVANNI STARON GYJ:[id number] DOB: 27-Dec-1950 DOA: 03/01/2017  PCP: [name], MD  Patient coming from: Home  I have personally briefly reviewed patient's old medical records in [place]  Chief Complaint: Respiratory distress  HPI: Jesse Avila is a 66 y.o. 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  Patient presents to the ED with c/o worsening SOB for the past 4 weeks that became severe in the last 24 hours.  Associated fatigue, cough productive of white sputum.  BLE swelling, generalized weakness.   ED Course: Patient given cefepime and vanc after CXR suspicious for post obstructive PNA.  WBC nl, afebrile, Trop of 0.14.  BNP of 500.  A.Fib in RVR with rate of 150.   Review of Systems: As per HPI otherwise 10 point review of systems negative.   Past Medical History:  Diagnosis Date  . Atrial fibrillation ([place])   . Carotid artery occlusion   . DJD (degenerative joint disease) of knee   . Essential hypertension, benign    takes Benicar daily  . Goals of care, counseling/discussion 09/18/2016  . History of colon polyps    benign  . History of gout   . History of radiation therapy 09/30/16-11/10/16   left lung 60 Gy in 30 fractions  . Hyperlipidemia    takes Fenofibrate and Crestor daily  . Insomnia    takes Ambien nightly as needed  . Joint pain   . Lung cancer ([place])   . Nocturia   . Peripheral vascular disease ([place])   . Primary localized osteoarthritis of left knee 12/26/2014  . Stroke [facility]) 2015?   takes Plavix daily as well as Pletal,not on plavix at present 05/15/15  . 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 ABDOMINAL AORTAGRAM N/A 03/29/2012   Procedure: ABDOMINAL Maxcine Ham;  Surgeon: Angelia Mould, MD;  Location: [facility] CATH LAB;  Service: Cardiovascular;  Laterality: N/A;  . COLONOSCOPY    . ENDARTERECTOMY Right 05/09/2014   Procedure: RIGHT CAROTID ENDARTERECTOMY WITH PATCH ANGIOPLASTY;  Surgeon: Conrad [place], MD;  Location: [place];  Service: Vascular;  Laterality: Right;  . ENDARTERECTOMY FEMORAL Right 05/17/2015   Procedure: ENDARTERECTOMY FEMORAL;  Surgeon: Angelia Mould, MD;  Location: [place];  Service: Vascular;  Laterality: Right;  . ENDOBRONCHIAL ULTRASOUND Bilateral 09/08/2016   Procedure: ENDOBRONCHIAL ULTRASOUND;  Surgeon: Rigoberto Noel, MD;  Location: [facility] ENDOSCOPY;  Service: Cardiopulmonary;  Laterality: Bilateral;  . FEMORAL-POPLITEAL BYPASS GRAFT Right 05/17/2015   Procedure: BYPASS GRAFT FEMORAL-BELOW KNEE POPLITEAL ARTERY, using gortex propaten graft 6 mm x 80 cm;  Surgeon: Angelia Mould, MD;  Location: [place];  Service: Vascular;  Laterality: Right;  . IR GENERIC HISTORICAL  09/25/2016   IR FLUORO GUIDE PORT INSERTION RIGHT 09/25/2016 Markus Daft, MD [facility]-INTERV RAD  . IR GENERIC HISTORICAL  09/25/2016   IR US GUIDE VASC ACCESS RIGHT 09/25/2016 Markus Daft, MD [facility]-INTERV RAD  . KNEE ARTHROSCOPY Right 11/2009  . LOWER EXTREMITY ANGIOGRAM Bilateral 03/23/2015   Procedure: Lower Extremity Angiogram;  Surgeon: Angelia Mould, MD;  Location: [place] CV LAB;  Service: Cardiovascular;  Laterality: Bilateral;  . MENISCUS REPAIR  11/2009  . PARTIAL KNEE ARTHROPLASTY Left  12/26/2014   Procedure: UNICOMPARTMENTAL KNEE;  Surgeon: Marchia Bond, MD;  Location: [place];  Service: Orthopedics;  Laterality: Left;  . PATCH ANGIOPLASTY Right 05/17/2015   Procedure: VEIN PATCH ANGIOPLASTY ILEOFEMORAL ARTERY;  Surgeon: Angelia Mould, MD;  Location: [place];  Service: Vascular;  Laterality: Right;  . PERCUTANEOUS STENT INTERVENTION N/A 05/10/2012   Procedure: PERCUTANEOUS STENT INTERVENTION;  Surgeon: Angelia Mould, MD;  Location: [facility] CATH LAB;  Service: Cardiovascular;  Laterality: N/A;  . PERIPHERAL VASCULAR CATHETERIZATION N/A 03/23/2015   Procedure: Abdominal Aortogram;  Surgeon: Angelia Mould, MD;  Location: [place] CV LAB;  Service: Cardiovascular;  Laterality: N/A;  . STENTS     PLACED IN ??BOTH LEGS   2013?     reports that he has quit smoking. He smoked 0.00 packs per day for 0.00 years. He has never used smokeless tobacco. He reports that he drinks alcohol. He reports that he does not use drugs.  Allergies  Allergen Reactions  . Cialis [Tadalafil] Other (See Comments)    Reaction:  Headache     Family History  Problem Relation Age of Onset  . Diabetes Mother   . Hypertension Mother   . Heart disease Mother        Coronary Artery Bypass Graft  . Hyperlipidemia Mother   . Heart attack Mother   . Hypertension Father   . Hyperlipidemia Sister   . Stroke Sister      Prior to Admission medications   Medication Sig Start Date End Date Taking? Authorizing Provider  amoxicillin (AMOXIL) 500 MG capsule Take 500 mg by mouth 4 (four) times daily. 02/26/17  Yes [provider]  apixaban (ELIQUIS) 5 MG TABS tablet Take 1 tablet (5 mg total) by mouth 2 (two) times daily. 10/22/16  Yes Eugenie Filler, MD  Blood Glucose Monitoring Suppl (ACCU-CHEK NANO SMARTVIEW) w/Device KIT by Does not apply route.   Yes [provider]  diltiazem (CARDIZEM CD) 120 MG 24 hr capsule Take 1 capsule (120 mg total) by mouth daily. 10/23/16  Yes Eugenie Filler, MD  famotidine (PEPCID) 10 MG tablet Take 20 mg by mouth daily as needed for heartburn or indigestion.   Yes [provider]  fenofibrate 160 MG tablet Take 160 mg by mouth daily.  01/23/17  Yes [provider]  glimepiride (AMARYL) 4 MG tablet Take 4 mg by mouth daily with breakfast.   Yes [provider]  guaiFENesin (ROBITUSSIN) 100 MG/5ML liquid Take 200 mg by mouth 3 (three) times daily  as needed for cough.   Yes [provider]  metFORMIN (GLUCOPHAGE-XR) 500 MG 24 hr tablet Take 500 mg by mouth 3 (three) times daily. 01/07/17  Yes [provider]  metoprolol succinate (TOPROL-XL) 100 MG 24 hr tablet Take 100 mg by mouth daily. 02/17/17  Yes [provider]  ondansetron (ZOFRAN) 4 MG tablet Take 4 mg by mouth every 8 (eight) hours as needed for nausea or vomiting.   Yes [provider]  pioglitazone (ACTOS) 15 MG tablet Take 15 mg by mouth daily.   Yes [provider]  rosuvastatin (CRESTOR) 40 MG tablet Take 40 mg by mouth every evening. For cholesterol   Yes [provider]  zolpidem (AMBIEN) 10 MG tablet Take 10 mg by mouth at bedtime as needed for sleep.  01/26/15  Yes [provider]  oxyCODONE-acetaminophen (PERCOCET/ROXICET) 5-325 MG tablet Take 1 tablet by mouth every 6 (six) hours as needed for severe  pain. Patient not taking: Reported on 02/19/2017 12/25/16   Curt Bears, MD    Physical Exam: Vitals:   03/01/17 0430 03/01/17 0524 03/01/17 0539 03/01/17 0557  BP: 118/63  114/79   Pulse: (!) 128  (!) 131   Resp:   (!) 31 (!) 36  Temp:      TempSrc:      SpO2: 94% 91% 94%   Weight:      Height:        Constitutional: NAD, calm, comfortable Eyes: PERRL, lids and conjunctivae normal ENMT: Mucous membranes are moist. Posterior pharynx clear of any exudate or lesions.Normal dentition.  Neck: normal, supple, no masses, no thyromegaly Respiratory: Wheezing, initially in respiratory distress with RR of 40, paradoxical abdominal wall motion, accessory muscle use.  Now improved on BIPAP to RR 25. Cardiovascular: Irregularly irregular, tachycardic.  3+ BLE pitting edema Abdomen: no tenderness, no masses palpated. No hepatosplenomegaly. Bowel sounds positive.  Musculoskeletal: no clubbing / cyanosis. No joint deformity upper and lower extremities. Good ROM, no contractures. Normal muscle tone.  Skin: no  rashes, lesions, ulcers. No induration Neurologic: CN 2-12 grossly intact. Sensation intact, DTR normal. Strength 5/5 in all 4.  Psychiatric: Normal judgment and insight. Alert and oriented x 3. Normal mood.    Labs on Admission: I have personally reviewed following labs and imaging studies  CBC:  Recent Labs Lab 03/01/17 0117  WBC 8.8  NEUTROABS 7.2  HGB 8.8*  HCT 27.3*  MCV 101.1*  PLT 924*   Basic Metabolic Panel:  Recent Labs Lab 03/01/17 0117  NA 136  K 3.6  CL 104  CO2 18*  GLUCOSE 205*  BUN 27*  CREATININE 1.36*  CALCIUM 9.0   GFR: Estimated Creatinine Clearance: 66.9 mL/min (A) (by C-G formula based on SCr of 1.36 mg/dL (H)). Liver Function Tests: No results for input(s): AST, ALT, ALKPHOS, BILITOT, PROT, ALBUMIN in the last 168 hours. No results for input(s): LIPASE, AMYLASE in the last 168 hours. No results for input(s): AMMONIA in the last 168 hours. Coagulation Profile: No results for input(s): INR, PROTIME in the last 168 hours. Cardiac Enzymes:  Recent Labs Lab 03/01/17 0134  TROPONINI 0.14*   BNP (last 3 results) No results for input(s): PROBNP in the last 8760 hours. HbA1C: No results for input(s): HGBA1C in the last 72 hours. CBG:  Recent Labs Lab 03/01/17 0204  GLUCAP 190*   Lipid Profile: No results for input(s): CHOL, HDL, LDLCALC, TRIG, CHOLHDL, LDLDIRECT in the last 72 hours. Thyroid Function Tests: No results for input(s): TSH, T4TOTAL, FREET4, T3FREE, THYROIDAB in the last 72 hours. Anemia Panel: No results for input(s): VITAMINB12, FOLATE, FERRITIN, TIBC, IRON, RETICCTPCT in the last 72 hours. Urine analysis:    Component Value Date/Time   COLORURINE AMBER (A) 03/01/2017 0452   APPEARANCEUR HAZY (A) 03/01/2017 0452   LABSPEC 1.015 03/01/2017 0452   PHURINE 5.0 03/01/2017 0452   GLUCOSEU NEGATIVE 03/01/2017 0452   HGBUR SMALL (A) 03/01/2017 0452   BILIRUBINUR NEGATIVE 03/01/2017 0452   KETONESUR NEGATIVE 03/01/2017  0452   PROTEINUR 100 (A) 03/01/2017 0452   UROBILINOGEN 0.2 05/15/2015 0900   NITRITE NEGATIVE 03/01/2017 0452   LEUKOCYTESUR NEGATIVE 03/01/2017 0452    Radiological Exams on Admission: Dg Chest 2 View  Result Date: 03/01/2017 CLINICAL DATA:  Shortness of breath for 4 weeks and worsening. History of atrial fibrillation, lung cancer, diabetes, previous smoker EXAM: CHEST  2 VIEW COMPARISON:  CT chest 02/06/2017.  Chest 02/02/2017 FINDINGS: Shallow  inspiration. Cardiac enlargement. Increasing opacities on the left likely representing a combination of known left hilar mass and developing consolidation or postobstructive change in the left lung. Probable small left pleural effusion. Right lung is grossly clear and expanded. Power port type central venous catheter with tip over the cavoatrial junction. No pneumothorax. Calcification of the aorta. IMPRESSION: Increasing opacities in the left lung likely representing combination of known left hilar mass with developing consolidation or postobstructive change in the left lung. Probable small left pleural effusion. Electronically Signed   By: Lucienne Capers M.D.   On: 03/01/2017 02:48    EKG: Independently reviewed.  Assessment/Plan Principal Problem:   Acute respiratory failure with hypoxia (HCC) Active Problems:   Diabetes mellitus, type 2 (HCC)   COPD (chronic obstructive pulmonary disease) (HCC)   Small cell lung carcinoma, left (HCC)   Atrial fibrillation with RVR (HCC)   Acute on chronic diastolic CHF (congestive heart failure) (HCC)   Postobstructive pneumonia   Lactic acidosis    1. Acute respiratory failure with hypoxia - likely multifactorial including A.Fib RVR, resultant CHF, and COPD exacerbations.  Also cannot exclude postobstructive PNA. 1. BIPAP 2. Continuous pulse ox 2. A.Fib RVR, causing Acute on Chronic diastolic CHF - 1. Clinically patient does appear at least somewhat fluid overloaded with 3+ pitting edema 2. Also has  troponin bump and BNP bump 3. Cardizem gtt 4. Continue home metoprolol 5. Likely will warrant cards consult since maxed on cardizem gtt, and still HR is 110s-120s 3. Acute on chronic diastolic CHF -  1. Got dose of Lasix at ~0400 in ED 2. Will hold off on ordering further lasix pending clinical response 3. Primarily feel like this is rate mediated heart failure 4. Serial trops ordered 5. Tele monitor 4. COPD exacerbation - 1. Adult wheeze protocol 2. Will put patient on solumedrol 5. Possible postobstructive PNA - 1. Will continue empiric cefepime and vanc started in ED 2. Cultures pending 6. Lactic acidosis - 1. Serial lactates 2. Possibly mal-perfusion from A.Fib RVR / CHF 3. dont think that patient is dehydrated clinically at this point 7. SCLC - 1. S/p chemo and radiation 2. Supposedly in remission right now according to patient 3. Currently planning for prophylactic neuro radiation 8. DM2 - 1. Hold home meds 2. Mod scale SSI AC  DVT prophylaxis: Eliquis Code Status: Full code for now Family Communication: Family at bedside Disposition Plan: Home after admit Consults called: None Admission status: Admit to inpatient - critically ill patient with respiratory distress, requiring BIPAP, requiring Cardizem drip.   Etta Quill DO [facility] Pager [phone]  If 7AM-7PM, please contact day team taking care of patient [email or web address] Password [email or web address]  03/01/2017, 6:12 AM

## 2017-03-01 NOTE — Progress Notes (Signed)
CRITICAL VALUE ALERT  Critical Value:  Troponin 0.40  Date & Time Notied:  03/01/2017 1345  Provider Notified: Macario Carls. MD  Orders Received/Actions taken: No new orders at this time.

## 2017-03-01 NOTE — Progress Notes (Signed)
CRITICAL VALUE ALERT  Critical Value: Lactic Acid 2.7  Date & Time Notied:  03/01/2017 .1623   Provider Notified: Ebbie Latus MD  Orders Received/Actions taken: No new orders at this time.

## 2017-03-01 NOTE — ED Notes (Signed)
Bed: PF79 Expected date:  Expected time:  Means of arrival:  Comments: Triage-[place] cancer pt

## 2017-03-01 NOTE — ED Provider Notes (Signed)
Jesse Avila   CSN: [id number] Arrival date & time: 03/01/17  0101   By signing my name below, I, Jesse Avila, attest that this documentation has been prepared under the direction and in the presence of Jesse Fuel, MD. Electronically signed, Jesse Avila, ED Scribe. 03/01/17. 1:43 AM.  History   Chief Complaint Chief Complaint  Patient presents with  . Weakness   The history is provided by the patient and medical records. No language interpreter was used.    Jesse Avila is a 66 y.o. male presenting to the Emergency Department concerning worsening SOB x 4 weeks that became severe within the last 24 hours. Associated fatigue, cough productive of white sputum, bilateral leg swelling and weakness reported. Pt states exertion worsens his SOB and it is mildly eased with rest and laying flat. Pt with h/o HLD, stroke, radiation therapy to treat lung cancer, HTN, PVD, AFIB, stent placement, cardiact catheterization and carotid artery occlusion. Pt in remission from lung cancer and preparing for radiation therapy. He was admitted for L lung pneumonia 02/02/2017 in [place]. Pt allegedly quit tobacco use 4 years ago. No chills, sweats, fever, chest tightness or pain. No other complaints at this time.   Past Medical History:  Diagnosis Date  . Atrial fibrillation ([place])   . Carotid artery occlusion   . DJD (degenerative joint disease) of knee   . Essential hypertension, benign    takes Benicar daily  . Goals of care, counseling/discussion 09/18/2016  . History of colon polyps    benign  . History of gout   . History of radiation therapy 09/30/16-11/10/16   left lung 60 Gy in 30 fractions  . Hyperlipidemia    takes Fenofibrate and Crestor daily  . Insomnia    takes Ambien nightly as needed  . Joint pain   . Lung cancer ([place])   . Nocturia   . Peripheral vascular disease ([place])   . Primary localized osteoarthritis of left knee 12/26/2014  . Stroke [facility]) 2015?   takes Plavix daily as well as Pletal,not on plavix at present 05/15/15  . 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 CAP (community acquired pneumonia) 02/02/2017  . Acute on chronic diastolic CHF (congestive heart failure) ([place]) 02/02/2017  . Port catheter in place 11/05/2016  . Atrial fibrillation with RVR ([place])   . Malignant neoplasm of lung ([place])   . Anemia   . Atrial fibrillation ([place]) 10/21/2016  . Small cell lung carcinoma, left ([place]) 09/18/2016  . Goals of care, counseling/discussion 09/18/2016  . Encounter for antineoplastic chemotherapy 09/18/2016  . Hilar lymphadenopathy   . COPD (chronic obstructive pulmonary disease) ([place]) 09/03/2016  . PAD (peripheral artery disease) ([place]) 05/17/2015  . Primary localized osteoarthritis of left knee 12/26/2014  . Knee osteoarthritis 12/26/2014  . Occlusion and stenosis of carotid artery without mention of cerebral infarction 04/21/2014  . History of stroke 04/21/2014  . Carotid stenosis with cerebral infarction less than 8 weeks ago 04/16/2014  . Diabetes mellitus, type 2 ([place]) 04/14/2014  . Obesity 04/14/2014  . Insomnia 10/18/2013  . Preop cardiovascular exam 12/12/2011  . DJD (degenerative joint disease) of knee   . ED (erectile dysfunction)   . GERD (gastroesophageal reflux disease)   . Essential hypertension, benign   . Peripheral vascular disease ([place])   .  Hyperlipidemia   . Critical lower limb ischemia 11/19/2011    Past Surgical History:  Procedure Laterality Date  . ABDOMINAL AORTAGRAM N/A 03/29/2012   Procedure: ABDOMINAL Maxcine Ham;  Surgeon: Angelia Mould, MD;  Location: [facility] CATH LAB;  Service: Cardiovascular;  Laterality: N/A;  . COLONOSCOPY    . ENDARTERECTOMY Right 05/09/2014   Procedure: RIGHT CAROTID ENDARTERECTOMY WITH PATCH ANGIOPLASTY;   Surgeon: Conrad St. Augustine, MD;  Location: [place];  Service: Vascular;  Laterality: Right;  . ENDARTERECTOMY FEMORAL Right 05/17/2015   Procedure: ENDARTERECTOMY FEMORAL;  Surgeon: Angelia Mould, MD;  Location: [place];  Service: Vascular;  Laterality: Right;  . ENDOBRONCHIAL ULTRASOUND Bilateral 09/08/2016   Procedure: ENDOBRONCHIAL ULTRASOUND;  Surgeon: Rigoberto Noel, MD;  Location: [facility] ENDOSCOPY;  Service: Cardiopulmonary;  Laterality: Bilateral;  . FEMORAL-POPLITEAL BYPASS GRAFT Right 05/17/2015   Procedure: BYPASS GRAFT FEMORAL-BELOW KNEE POPLITEAL ARTERY, using gortex propaten graft 6 mm x 80 cm;  Surgeon: Angelia Mould, MD;  Location: [place];  Service: Vascular;  Laterality: Right;  . IR GENERIC HISTORICAL  09/25/2016   IR FLUORO GUIDE PORT INSERTION RIGHT 09/25/2016 Markus Daft, MD [facility]-INTERV RAD  . IR GENERIC HISTORICAL  09/25/2016   IR US GUIDE VASC ACCESS RIGHT 09/25/2016 Markus Daft, MD [facility]-INTER[facility] RAD  . KNEE ARTHROSCOPY Right 11/2009  . LOWER EXTREMITY ANGIOGRAM Bilateral 03/23/2015   Procedure: Lower Extremity Angiogram;  Surgeon: Angelia Mould, MD;  Location: [place] CV LAB;  Service: Cardiovascular;  Laterality: Bilateral;  . MENISCUS REPAIR  11/2009  . PARTIAL KNEE ARTHROPLASTY Left 12/26/2014   Procedure: UNICOMPARTMENTAL KNEE;  Surgeon: Marchia Bond, MD;  Location: [place];  Service: Orthopedics;  Laterality: Left;  . PATCH ANGIOPLASTY Right 05/17/2015   Procedure: VEIN PATCH ANGIOPLASTY ILEOFEMORAL ARTERY;  Surgeon: Angelia Mould, MD;  Location: [place];  Service: Vascular;  Laterality: Right;  . PERCUTANEOUS STENT INTERVENTION N/A 05/10/2012   Procedure: PERCUTANEOUS STENT INTERVENTION;  Surgeon: Angelia Mould, MD;  Location: [facility] CATH LAB;  Service: Cardiovascular;  Laterality: N/A;  . PERIPHERAL VASCULAR CATHETERIZATION N/A 03/23/2015   Procedure: Abdominal Aortogram;  Surgeon: Angelia Mould, MD;  Location: [place] CV LAB;  Service: Cardiovascular;   Laterality: N/A;  . STENTS     PLACED IN ??BOTH LEGS   2013?       Home Medications    Prior to Admission medications   Medication Sig Start Date End Date Taking? Authorizing Provider  apixaban (ELIQUIS) 5 MG TABS tablet Take 1 tablet (5 mg total) by mouth 2 (two) times daily. 10/22/16   Eugenie Filler, MD  Blood Glucose Monitoring Suppl (ACCU-CHEK NANO SMARTVIEW) w/Device KIT by Does not apply route.    [provider]  dextromethorphan-guaiFENesin (MUCINEX DM) 30-600 MG 12hr tablet Take 1 tablet by mouth 2 (two) times daily.    [provider]  diltiazem (CARDIZEM CD) 120 MG 24 hr capsule Take 1 capsule (120 mg total) by mouth daily. 10/23/16   Eugenie Filler, MD  fenofibrate 160 MG tablet Take 1 tablet by mouth daily. 01/23/17   [provider]  glimepiride (AMARYL) 4 MG tablet Take 4 mg by mouth daily with breakfast.    [provider]  guaiFENesin (ROBITUSSIN) 100 MG/5ML liquid Take 200 mg by mouth 3 (three) times daily as needed for cough.    [provider]  lidocaine-prilocaine (EMLA) cream Apply 1 application topically as needed. Patient not taking: Reported on 02/19/2017 11/26/16   Curt Bears, MD  magnesium oxide (MAG-OX) 400 (241.3 Mg) MG tablet Take 1 tablet (400 mg total) by mouth 2 (two) times daily. Patient not taking: Reported on 02/19/2017 02/05/17   Nita Sells, MD  metFORMIN (GLUCOPHAGE-XR) 500 MG 24 hr tablet Take 500 mg by mouth 3 (three) times daily. 01/07/17   [provider]  metoprolol tartrate 75 MG TABS Take 75 mg by mouth 2 (two) times daily. 02/05/17 02/05/18  Nita Sells, MD  oxyCODONE-acetaminophen (PERCOCET/ROXICET) 5-325 MG tablet Take 1 tablet by mouth every 6 (six) hours as needed for severe pain. Patient not taking: Reported on 02/19/2017 12/25/16   Curt Bears, MD  pioglitazone (ACTOS) 15 MG tablet Take 15 mg by mouth daily.    [provider]  rosuvastatin (CRESTOR) 40  MG tablet Take 40 mg by mouth every evening. For cholesterol    [provider]  zolpidem (AMBIEN) 10 MG tablet Take 10 mg by mouth at bedtime as needed for sleep.  01/26/15   [provider]    Family History Family History  Problem Relation Age of Onset  . Diabetes Mother   . Hypertension Mother   . Heart disease Mother        Coronary Artery Bypass Graft  . Hyperlipidemia Mother   . Heart attack Mother   . Hypertension Father   . Hyperlipidemia Sister   . Stroke Sister     Social History Social History  Substance Use Topics  . Smoking status: Former Smoker    Packs/day: 0.00    Years: 0.00  . Smokeless tobacco: Never Used     Comment: quit smoking 4 yrs ago  . Alcohol use 0.0 oz/week     Comment: beer- occasionally     Allergies   Cialis [tadalafil]   Review of Systems Review of Systems  Constitutional: Positive for fatigue. Negative for chills, diaphoresis and fever.  Respiratory: Positive for cough and shortness of breath. Negative for chest tightness.   Cardiovascular: Positive for leg swelling. Negative for chest pain.  Skin: Negative for color change and wound.  Allergic/Immunologic: Positive for immunocompromised state.  Neurological: Positive for weakness.  All other systems reviewed and are negative.    Physical Exam Updated Vital Signs BP (!) 120/94 (BP Location: Left Arm)   Pulse (!) 145   Temp (!) 97.4 F (36.3 C) (Oral)   Resp (!) 24   Ht '5\' 11"'$  (1.803 m)   Wt 220 lb (99.8 kg)   SpO2 98%   BMI 30.68 kg/m   Physical Exam  Constitutional: He is oriented to person, place, and time. He appears well-developed and well-nourished.  HENT:  Head: Normocephalic and atraumatic.  Eyes: Pupils are equal, round, and reactive to light. EOM are normal.  Neck: Normal range of motion. Neck supple. JVD present.  Cardiovascular: Normal rate, regular rhythm and normal heart sounds.   No murmur heard. Pulmonary/Chest: Effort normal. He has  decreased breath sounds in the left upper field. He has no wheezes. He has rales in the left middle field. He exhibits no tenderness.  Mediport present on R. Decreased breath sounds L upper lobe. Few rales L mid lung field.  Abdominal: Soft. Bowel sounds are normal. He exhibits no distension and no mass. There is no tenderness.  Musculoskeletal: Normal range of motion. He exhibits no edema.  2-3+ pretibial edema. 2+ presacral edema.  Lymphadenopathy:    He has no cervical adenopathy.  Neurological: He is alert and oriented to person, place, and time. No cranial nerve  deficit. He exhibits normal muscle tone. Coordination normal.  Skin: Skin is warm and dry. No rash noted.  Psychiatric: He has a normal mood and affect. His behavior is normal. Judgment and thought content normal.  Nursing Avila and vitals reviewed.    ED Treatments / Results  DIAGNOSTIC STUDIES: Oxygen Saturation is 98% on RA, NL by my interpretation.    COORDINATION OF CARE: 1:37 AM-Discussed next steps with pt. Pt verbalized understanding and is agreeable with the plan. Will order blood work, Oncologist and medication.   Labs (all labs ordered are listed, but only abnormal results are displayed) Labs Reviewed  BASIC METABOLIC PANEL - Abnormal; Notable for the following:       Result Value   CO2 18 (*)    Glucose, Bld 205 (*)    BUN 27 (*)    Creatinine, Ser 1.36 (*)    GFR calc non Af Amer 54 (*)    All other components within normal limits  CBC - Abnormal; Notable for the following:    RBC 2.70 (*)    Hemoglobin 8.8 (*)    HCT 27.3 (*)    MCV 101.1 (*)    RDW 20.8 (*)    Platelets 135 (*)    All other components within normal limits  URINALYSIS, ROUTINE W REFLEX MICROSCOPIC - Abnormal; Notable for the following:    Color, Urine AMBER (*)    APPearance HAZY (*)    Hgb urine dipstick SMALL (*)    Protein, ur 100 (*)    Squamous Epithelial / LPF 0-5 (*)    All other components within normal limits  BRAIN  NATRIURETIC PEPTIDE - Abnormal; Notable for the following:    B Natriuretic Peptide 503.1 (*)    All other components within normal limits  TROPONIN I - Abnormal; Notable for the following:    Troponin I 0.14 (*)    All other components within normal limits  LACTIC ACID, PLASMA - Abnormal; Notable for the following:    Lactic Acid, Venous 3.7 (*)    All other components within normal limits  LACTIC ACID, PLASMA - Abnormal; Notable for the following:    Lactic Acid, Venous 2.2 (*)    All other components within normal limits  BLOOD GAS, ARTERIAL - Abnormal; Notable for the following:    pCO2 arterial 28.4 (*)    pO2, Arterial 133 (*)    Bicarbonate 18.6 (*)    Acid-base deficit 4.7 (*)    All other components within normal limits  TROPONIN I - Abnormal; Notable for the following:    Troponin I 0.23 (*)    All other components within normal limits  TROPONIN I - Abnormal; Notable for the following:    Troponin I 0.40 (*)    All other components within normal limits  TROPONIN I - Abnormal; Notable for the following:    Troponin I 0.36 (*)    All other components within normal limits  GLUCOSE, CAPILLARY - Abnormal; Notable for the following:    Glucose-Capillary 196 (*)    All other components within normal limits  D-DIMER, QUANTITATIVE (NOT AT [facility]) - Abnormal; Notable for the following:    D-Dimer, Quant 4.06 (*)    All other components within normal limits  GLUCOSE, CAPILLARY - Abnormal; Notable for the following:    Glucose-Capillary 260 (*)    All other components within normal limits  BASIC METABOLIC PANEL - Abnormal; Notable for the following:    Potassium 3.2 (*)  Glucose, Bld 301 (*)    BUN 30 (*)    Creatinine, Ser 1.28 (*)    GFR calc non Af Amer 58 (*)    All other components within normal limits  LACTIC ACID, PLASMA - Abnormal; Notable for the following:    Lactic Acid, Venous 2.7 (*)    All other components within normal limits  GLUCOSE, CAPILLARY - Abnormal;  Notable for the following:    Glucose-Capillary 277 (*)    All other components within normal limits  GLUCOSE, CAPILLARY - Abnormal; Notable for the following:    Glucose-Capillary 244 (*)    All other components within normal limits  CBG MONITORING, ED - Abnormal; Notable for the following:    Glucose-Capillary 190 (*)    All other components within normal limits  CULTURE, BLOOD (ROUTINE X 2)  CULTURE, BLOOD (ROUTINE X 2)  MRSA PCR SCREENING  CULTURE, EXPECTORATED SPUTUM-ASSESSMENT  DIFFERENTIAL  HIV ANTIBODY (ROUTINE TESTING)  STREP PNEUMONIAE URINARY ANTIGEN  COMPREHENSIVE METABOLIC PANEL  CBC WITH DIFFERENTIAL/PLATELET    EKG  EKG Interpretation  Date/Time:  Sunday March 01 2017 01:17:39 EDT Ventricular Rate:  141 PR Interval:    QRS Duration: 101 QT Interval:  335 QTC Calculation: 514 R Axis:   87 Text Interpretation:  Atrial fibrillation Probable anterolateral infarct, age indeterm Prolonged QT interval When compared with ECG of 02/02/2017, QT has lengthened Confirmed by Jesse Avila ([id number]) on 03/01/2017 1:22:29 AM       Radiology Dg Chest 2 View  Result Date: 03/01/2017 CLINICAL DATA:  Shortness of breath for 4 weeks and worsening. History of atrial fibrillation, lung cancer, diabetes, previous smoker EXAM: CHEST  2 VIEW COMPARISON:  CT chest 02/06/2017.  Chest 02/02/2017 FINDINGS: Shallow inspiration. Cardiac enlargement. Increasing opacities on the left likely representing a combination of known left hilar mass and developing consolidation or postobstructive change in the left lung. Probable small left pleural effusion. Right lung is grossly clear and expanded. Power port type central venous catheter with tip over the cavoatrial junction. No pneumothorax. Calcification of the aorta. IMPRESSION: Increasing opacities in the left lung likely representing combination of known left hilar mass with developing consolidation or postobstructive change in the left lung. Probable  small left pleural effusion. Electronically Signed   By: Lucienne Capers M.D.   On: 03/01/2017 02:48   Nm Pulmonary Perf And Vent  Result Date: 03/01/2017 CLINICAL DATA:  Shortness of breath with exertion for 4 weeks. History of left-sided cancer status post chemotherapy. Patient receiving radiation. EXAM: NUCLEAR MEDICINE VENTILATION - PERFUSION LUNG SCAN TECHNIQUE: Ventilation images were obtained in multiple projections using inhaled aerosol Tc-75mDTPA. Perfusion images were obtained in multiple projections after intravenous injection of Tc-951mAA. RADIOPHARMACEUTICALS:  33.0 mCi Technetium-9958mPA aerosol inhalation and 4.2 mCi Technetium-21m32m IV COMPARISON:  None. FINDINGS: Ventilation: Multiple small ventilation defects in the right lung. Multiple large ventilation defects in the left lung, correlating with recent CT and plain film findings. Perfusion: No significant perfusion abnormalities in the right lung. Large perfusion defects on the left match the ventilation abnormalities. IMPRESSION: Multiple matched defects in the left lung correlate with x-ray findings. As a result, this is an intermediate probability V/Q scan. Electronically Signed   By: DaviDorise Bullion M.D   On: 03/01/2017 20:20    Procedures Procedures (including critical care time) CRITICAL CARE Performed by: GLICUXNAT,FTDDUal critical care time: 60 minutes Critical care time was exclusive of separately billable procedures and treating other patients. Critical care  was necessary to treat or prevent imminent or life-threatening deterioration. Critical care was time spent personally by me on the following activities: development of treatment plan with patient and/or surrogate as well as nursing, discussions with consultants, evaluation of patient's response to treatment, examination of patient, obtaining history from patient or surrogate, ordering and performing treatments and interventions, ordering and review of  laboratory studies, ordering and review of radiographic studies, pulse oximetry and re-evaluation of patient's condition.  Medications Ordered in ED Medications  diltiazem (CARDIZEM) 1 mg/mL load via infusion 10 mg (10 mg Intravenous Bolus from Bag 03/01/17 0157)    And  diltiazem (CARDIZEM) 100 mg in dextrose 5% 189m (1 mg/mL) infusion (15 mg/hr Intravenous New Bag/Given 03/01/17 0546)  metoprolol succinate (TOPROL-XL) 24 hr tablet 100 mg (100 mg Oral Given 03/01/17 1516)  ondansetron (ZOFRAN) tablet 4 mg (not administered)  rosuvastatin (CRESTOR) tablet 40 mg (40 mg Oral Given 03/01/17 1735)  zolpidem (AMBIEN) tablet 10 mg (10 mg Oral Given 03/01/17 2137)  guaiFENesin (ROBITUSSIN) 100 MG/5ML solution 200 mg (not administered)  famotidine (PEPCID) tablet 20 mg (not administered)  fenofibrate tablet 160 mg (160 mg Oral Given 03/01/17 1515)  diltiazem (CARDIZEM CD) 24 hr capsule 120 mg (120 mg Oral Given 03/01/17 1515)  apixaban (ELIQUIS) tablet 5 mg (5 mg Oral Given 03/01/17 2137)  insulin aspart (novoLOG) injection 0-15 Units (8 Units Subcutaneous Given 03/01/17 1640)  methylPREDNISolone sodium succinate (SOLU-MEDROL) 40 mg/mL injection (  Not Given 03/01/17 0620)  methylPREDNISolone sodium succinate (SOLU-MEDROL) 125 mg/2 mL injection 60 mg (60 mg Intravenous Given 03/01/17 1735)  vancomycin (VANCOCIN) IVPB 1000 mg/200 mL premix (0 mg Intravenous Stopped 03/01/17 1834)  Chlorhexidine Gluconate Cloth 2 % PADS 6 each (6 each Topical Given 03/01/17 1200)  amiodarone (NEXTERONE) 1.8 mg/mL load via infusion 150 mg (150 mg Intravenous Bolus from Bag 03/01/17 1244)    Followed by  amiodarone (NEXTERONE PREMIX) 360-4.14 MG/200ML-% (1.8 mg/mL) IV infusion (60 mg/hr Intravenous New Bag/Given 03/01/17 1518)    Followed by  amiodarone (NEXTERONE PREMIX) 360-4.14 MG/200ML-% (1.8 mg/mL) IV infusion (30 mg/hr Intravenous Rate/Dose Change 03/01/17 1951)  furosemide (LASIX) injection 20 mg (20 mg Intravenous Given  03/01/17 1247)  piperacillin-tazobactam (ZOSYN) IVPB 3.375 g (3.375 g Intravenous New Bag/Given 03/01/17 2136)  technetium TC 10M diethylenetriame-pentaacetic acid (DTPA) injection 33 millicurie (not administered)  ipratropium (ATROVENT) nebulizer solution 0.5 mg (not administered)  levalbuterol (XOPENEX) nebulizer solution 1.25 mg (not administered)  furosemide (LASIX) injection 40 mg (40 mg Intravenous Given 03/01/17 0335)  vancomycin (VANCOCIN) IVPB 1000 mg/200 mL premix (0 mg Intravenous Stopped 03/01/17 0447)  ceFEPIme (MAXIPIME) 2 g in dextrose 5 % 50 mL IVPB (0 g Intravenous Stopped 03/01/17 0514)  diltiazem (CARDIZEM) 1 mg/mL load via infusion 10 mg (10 mg Intravenous Bolus from Bag 03/01/17 0331)  albuterol (PROVENTIL) (2.5 MG/3ML) 0.083% nebulizer solution 5 mg (5 mg Nebulization Not Given 03/01/17 0536)  vancomycin (VANCOCIN) IVPB 1000 mg/200 mL premix (0 mg Intravenous Stopped 03/01/17 0915)  technetium albumin aggregated (MAA) injection solution 4.2 millicurie (4.2 millicuries Intravenous Contrast Given 03/01/17 1915)     Initial Impression / Assessment and Plan / ED Course  I have reviewed the triage vital signs and the nursing notes.  Pertinent labs & imaging results that were available during my care of the patient were reviewed by me and considered in my medical decision making (see chart for details).  Atrial fibrillation with rapid ventricular response. Old records are reviewed, and he has known small cell  carcinoma of the lung which apparently has been stable recently. He does have history of atrial fibrillation and is anticoagulated on apixaban. Dyspnea appears to be multifactorial. Probable component of COPD, congestive heart failure, and his cancer. He is on a fairly high dose of beta blocker, and a small dose of calcium channel blocker. He is started on diltiazem bolus and drip. Heart rate came down but did require a second bolus of diltiazem. Troponin is mildly elevated which is  probably secondary to demand ischemia. He is given a dose of furosemide.chest x-ray showed questionable trace showed questionable area of pneumonia, she was started on antibiotics. BNP is moderately elevated and increased over baseline. Renal insufficiency is present and unchanged from baseline. Case is discussed with Dr. Alcario Drought of [facility] who agrees to admit the patient. Of Avila, he was not placed on heparin because he is already anticoagulated.  CHA2DS2/VAS Stroke Risk Points      3 >= 2 Points: High Risk  1 - 1.99 Points: Medium Risk  0 Points: Low Risk    The previous score was 2 on 10/21/2016.:  Change:         Details    Avila: External data might be a factor in metrics not marked with    Points Metrics   This score determines the patient's risk of having a stroke if the  patient has atrial fibrillation.       1 Has Congestive Heart Failure:  Yes   0 Has Vascular Disease:  No   1 Has Hypertension:  Yes   0 Age:  73   1 Has Diabetes:  Yes   0 Had Stroke:  No Had TIA:  No Had thromboembolism:  No   0 Male:  No    Final Clinical Impressions(s) / ED Diagnoses   Final diagnoses:  Atrial fibrillation with rapid ventricular response (HCC)  Acute on chronic congestive heart failure, unspecified heart failure type ([place])  HCAP (healthcare-associated pneumonia)  Renal insufficiency  Macrocytic anemia  Small cell lung carcinoma, left (HCC)    New Prescriptions New Prescriptions   No medications on file   I personally performed the services described in this documentation, which was scribed in my presence. The recorded information has been reviewed and is accurate.       Jesse Fuel, MD 72/55/00 [id number]

## 2017-03-01 NOTE — Progress Notes (Signed)
CRITICAL VALUE ALERT  Critical Value:  Lactic Acid 2.2  Date & Time Notified: 03/01/2017 0815  Provider Notified: Macario Carls. MD  Orders Received/Actions taken: No new orders at this time

## 2017-03-01 NOTE — ED Notes (Signed)
EKG given to EDP,Glick,MD., for review. 

## 2017-03-01 NOTE — Progress Notes (Signed)
[facility]   Patient admitted after midnight see H&P for further details.  In brief patient is a 66 year old male with history of lung cancer, hypertension, PAF, PAD, diabetes mellitus who presented to the emergency department complaining of shortness of breath. Patient was admitted for A. fib with RVR, possible pneumonia and CHF exacerbation. Patient was placed in BiPAP, IV Lasix were given and IV antibiotics were started. Cardiology has been consulted.  Patient seen, examined and chart was reviewed.  Impression Acute respiratory failure with hypoxia - multifactorial in view of mild CHF exacerbation, A. fib with RVR, possible postobstructive pneumonia and COPD Patient was started on IV cefepime which will be changed to Zosyn for more anaerobic coverage. Cardiology input appreciated, d-dimer was ordered which was elevated, likely to be in setting of acute inflammatory process although will need to rule out PE given cancer. We'll repeat BMP is kidney function has improved will send for CTA if not we'll order VQ scan. Continue Solu-Medrol and nebulizer Discontinue BiPAP Cardiology start the patient on low-dose IV Lasix, monitor kidney function. Continue O2 supplementation to keep saturation above 91%. Will discuss case with PCCM, may need bronchoscopy  PAF CHADSVASc 5 on Eliquis Initially started on Cardizem drip and metoprolol Cardiology now start patient on IV amiodarone Continue oral Cardizem and metoprolol Echocardiogram has been ordered to rule out pericardial effusion  Elevated troponin Likely demand ischemia No chest pain or acute EKG changes Part cardiology recommendations  Chipper Oman, MD

## 2017-03-01 NOTE — Consult Note (Signed)
Cardiology Consultation:   Patient ID: CAMERON SCHWINN; [id number]; 1951/03/29   Admit date: 03/01/2017 Date of Consult: 03/01/2017  Primary Care Provider: [name], MD Primary Cardiologist: Rockey Situ Primary Electrophysiologist:  None   Patient Profile:   OSWALDO CUETO is a 66 y.o. male with a hx of lung cancer  who is being seen today for the evaluation of afib at the request of Dr Elon Jester.  History of Present Illness:   Mr. Agard 66 y.o. history of HTN, DM, elevated lipids, PAD, PAF with CHADSVASC 5 on chronic eliquis with no missed doses. Hospitalized in March with with PAF converted with iv cardizem. 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 Post d/c required transfusion for Hb 7 In NSR when seen by Dr Rockey Situ in [facility] 11/11/16  Echo done 10/22/16 with EF 50% no significant valve disease. LA mildly dilated at 39 mm.    Patient presents to the ED with c/o worsening SOB for the past 4 weeks that became severe in the last 24 hours.  Associated fatigue, cough productive of white sputum.  BLE swelling, generalized weakness. CXR with possible post obstructive pneumonia. Lactate elevated with WBC 8.8 and Hct 27.3.  BNP 503 Has had more LE edema as well. No chest pain Troponin elevated at .23 with no acute ECG changes    Past Medical History:  Diagnosis Date  . Atrial fibrillation ([place])   . Carotid artery occlusion   . DJD (degenerative joint disease) of knee   . Essential hypertension, benign    takes Benicar daily  . Goals of care, counseling/discussion 09/18/2016  . History of colon polyps    benign  . History of gout   . History of radiation therapy 09/30/16-11/10/16   left lung 60 Gy in 30 fractions  . Hyperlipidemia    takes Fenofibrate and Crestor daily  . Insomnia    takes Ambien nightly as needed  . Joint pain   . Lung cancer ([place])   . Nocturia   . Peripheral vascular  disease ([place])   . Primary localized osteoarthritis of left knee 12/26/2014  . Stroke [facility]) 2015?   takes Plavix daily as well as Pletal,not on plavix at present 05/15/15  . 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 ABDOMINAL AORTAGRAM N/A 03/29/2012   Procedure: ABDOMINAL Maxcine Ham;  Surgeon: Angelia Mould, MD;  Location: [facility] CATH LAB;  Service: Cardiovascular;  Laterality: N/A;  . COLONOSCOPY    . ENDARTERECTOMY Right 05/09/2014   Procedure: RIGHT CAROTID ENDARTERECTOMY WITH PATCH ANGIOPLASTY;  Surgeon: Conrad Tierra Bonita, MD;  Location: [place];  Service: Vascular;  Laterality: Right;  . ENDARTERECTOMY FEMORAL Right 05/17/2015   Procedure: ENDARTERECTOMY FEMORAL;  Surgeon: Angelia Mould, MD;  Location: [place];  Service: Vascular;  Laterality: Right;  . ENDOBRONCHIAL ULTRASOUND Bilateral 09/08/2016   Procedure: ENDOBRONCHIAL ULTRASOUND;  Surgeon: Rigoberto Noel, MD;  Location: [facility] ENDOSCOPY;  Service: Cardiopulmonary;  Laterality: Bilateral;  . FEMORAL-POPLITEAL BYPASS GRAFT Right 05/17/2015   Procedure: BYPASS GRAFT FEMORAL-BELOW KNEE POPLITEAL ARTERY, using gortex propaten graft 6 mm x 80 cm;  Surgeon: Angelia Mould, MD;  Location: [place];  Service: Vascular;  Laterality: Right;  . IR GENERIC HISTORICAL  09/25/2016   IR FLUORO GUIDE  PORT INSERTION RIGHT 09/25/2016 Markus Daft, MD [facility]-INTERV RAD  . IR GENERIC HISTORICAL  09/25/2016   IR US GUIDE VASC ACCESS RIGHT 09/25/2016 Markus Daft, MD [facility]-INTERV RAD  . KNEE ARTHROSCOPY Right 11/2009  . LOWER EXTREMITY ANGIOGRAM Bilateral 03/23/2015   Procedure: Lower Extremity Angiogram;  Surgeon: Angelia Mould, MD;  Location: [place] CV LAB;  Service: Cardiovascular;  Laterality: Bilateral;  . MENISCUS REPAIR  11/2009  . PARTIAL KNEE ARTHROPLASTY Left 12/26/2014   Procedure:  UNICOMPARTMENTAL KNEE;  Surgeon: Marchia Bond, MD;  Location: [place];  Service: Orthopedics;  Laterality: Left;  . PATCH ANGIOPLASTY Right 05/17/2015   Procedure: VEIN PATCH ANGIOPLASTY ILEOFEMORAL ARTERY;  Surgeon: Angelia Mould, MD;  Location: [place];  Service: Vascular;  Laterality: Right;  . PERCUTANEOUS STENT INTERVENTION N/A 05/10/2012   Procedure: PERCUTANEOUS STENT INTERVENTION;  Surgeon: Angelia Mould, MD;  Location: [facility] CATH LAB;  Service: Cardiovascular;  Laterality: N/A;  . PERIPHERAL VASCULAR CATHETERIZATION N/A 03/23/2015   Procedure: Abdominal Aortogram;  Surgeon: Angelia Mould, MD;  Location: [place] CV LAB;  Service: Cardiovascular;  Laterality: N/A;  . STENTS     PLACED IN ??BOTH LEGS   2013?     Inpatient Medications: Scheduled Meds: . apixaban  5 mg Oral BID  . Chlorhexidine Gluconate Cloth  6 each Topical Q0600  . diltiazem  120 mg Oral Daily  . fenofibrate  160 mg Oral Daily  . insulin aspart  0-15 Units Subcutaneous TID WC  . ipratropium  0.5 mg Nebulization Q4H  . levalbuterol  1.25 mg Nebulization Q4H  . methylPREDNISolone (SOLU-MEDROL) injection  60 mg Intravenous Q12H  . methylPREDNISolone sodium succinate      . metoprolol succinate  100 mg Oral Daily  . rosuvastatin  40 mg Oral QPM   Continuous Infusions: . ceFEPime (MAXIPIME) IV    . vancomycin     PRN Meds: famotidine, guaiFENesin, ondansetron, zolpidem  Allergies:    Allergies  Allergen Reactions  . Cialis [Tadalafil] Other (See Comments)    Reaction:  Headache     Social History:   Social History   Social History  . Marital status: Married    Spouse name: N/A  . Number of children: N/A  . Years of education: N/A   Occupational History  . Not on file.   Social History Main Topics  . Smoking status: Former Smoker    Packs/day: 0.00    Years: 0.00  . Smokeless tobacco: Never Used     Comment: quit smoking 4 yrs ago  . Alcohol use 0.0 oz/week     Comment:  beer- occasionally  . Drug use: No  . Sexual activity: No   Other Topics Concern  . Not on file   Social History Narrative  . No narrative on file    Family History:   The patient's family history includes Diabetes in his mother; Heart attack in his mother; Heart disease in his mother; Hyperlipidemia in his mother and sister; Hypertension in his father and mother; Stroke in his sister.  ROS:  Please see the history of present illness.  ROS  All other ROS reviewed and negative.     Physical Exam/Data:   Vitals:   03/01/17 0900 03/01/17 1000 03/01/17 1100 03/01/17 1106  BP: 124/69 121/73 115/78   Pulse: (!) 127 (!) 105 (!) 102 (!) 108  Resp: (!) 27 (!) 24 (!) 21 (!) 24  Temp:      TempSrc:  SpO2: 99% 97% 97% 97%  Weight:      Height:        Intake/Output Summary (Last 24 hours) at 03/01/17 1140 Last data filed at 03/01/17 0514  Gross per 24 hour  Intake              250 ml  Output                0 ml  Net              250 ml   Filed Weights   03/01/17 0113  Weight: 220 lb (99.8 kg)   Body mass index is 30.68 kg/m.  General:  Chronically ill white male  HEENT: normal Lymph: no adenopathy Neck: no JVD Endocrine:  No thryomegaly Vascular: No carotid bruits; FA pulses 2+ bilaterally without bruits  Cardiac:  normal S1, S2; RRR; no murmur  Lungs:  Rhonchi and expiratory wheezes worse on left  Abd: soft, nontender, no hepatomegaly  Ext: no edema Musculoskeletal:  No deformities, BUE and BLE strength normal and equal Skin: warm and dry  Neuro:  CNs 2-12 intact, no focal abnormalities noted Psych: Lethargic Port a cath below right subclavian   EKG:  The EKG was personally reviewed and demonstrates:  afib low voltage poor R wave progression  Telemetry:  Telemetry was personally reviewed and demonstrates:  afib rate 100's   Relevant CV Studies: Echo:  10/22/16 EF 50% mild LAE   Laboratory Data:  Chemistry Recent Labs Lab 03/01/17 0117  NA 136  K 3.6    CL 104  CO2 18*  GLUCOSE 205*  BUN 27*  CREATININE 1.36*  CALCIUM 9.0  GFRNONAA 54*  GFRAA >60  ANIONGAP 14    No results for input(s): PROT, ALBUMIN, AST, ALT, ALKPHOS, BILITOT in the last 168 hours. Hematology Recent Labs Lab 03/01/17 0117  WBC 8.8  RBC 2.70*  HGB 8.8*  HCT 27.3*  MCV 101.1*  MCH 32.6  MCHC 32.2  RDW 20.8*  PLT 135*   Cardiac Enzymes Recent Labs Lab 03/01/17 0134 03/01/17 0539  TROPONINI 0.14* 0.23*   No results for input(s): TROPIPOC in the last 168 hours.  BNP Recent Labs Lab 03/01/17 0134  BNP 503.1*    DDimer No results for input(s): DDIMER in the last 168 hours.  Radiology/Studies:  Dg Chest 2 View  Result Date: 03/01/2017 CLINICAL DATA:  Shortness of breath for 4 weeks and worsening. History of atrial fibrillation, lung cancer, diabetes, previous smoker EXAM: CHEST  2 VIEW COMPARISON:  CT chest 02/06/2017.  Chest 02/02/2017 FINDINGS: Shallow inspiration. Cardiac enlargement. Increasing opacities on the left likely representing a combination of known left hilar mass and developing consolidation or postobstructive change in the left lung. Probable small left pleural effusion. Right lung is grossly clear and expanded. Power port type central venous catheter with tip over the cavoatrial junction. No pneumothorax. Calcification of the aorta. IMPRESSION: Increasing opacities in the left lung likely representing combination of known left hilar mass with developing consolidation or postobstructive change in the left lung. Probable small left pleural effusion. Electronically Signed   By: Lucienne Capers M.D.   On: 03/01/2017 02:48    Assessment and Plan:   1. PAF:  Has not missed any doses of eliquis. Rate control with oral cardizem and metoprolol. Start iv amiodarone. F/U echo given mediastinal/lung CA and low voltage r/o pericardial effusion. Not a candidate of [facility] currently due to respiratory status but can consider prior to d/c  if he does not  convert.  2. Pulmonary: consider consult has he may have post obstructive pneumonia and may need bronchoscopy steroids and antibiotics per primary service  3. Cholesterol :  On statin 4. CHF: mild elevation in BNP with LE edema Check D dimer if positive consider CTA to r/o PE given Cancer and edema. Start low dose iv lasix given edema and need for iv antibiotics f/u echo previous EF 50%  5. Elevated troponin : no chest pain or acute ECG changes trend enzymes no indication for cath No ASA due to eliquis and anemia requiring transfusion in recent past    Signed, Jenkins Rouge, MD  03/01/2017 11:40 AM

## 2017-03-01 NOTE — ED Notes (Signed)
Dr. Alcario Drought was made aware that blood cultures x 2 were obtained after pt received first doses of IV antibiotics.

## 2017-03-01 NOTE — ED Notes (Signed)
Dr. Jennette Kettle, hospitalist, at bedside.

## 2017-03-01 NOTE — Progress Notes (Addendum)
Pharmacy Antibiotic Note  Jesse Avila is a 66 y.o. male admitted on 03/01/2017 with post-obstructive pneumonia.  Pharmacy has been consulted for Vancomycin & Cefepime dosing.  Plan: Vancomycin total 2gm initially, then 1gm IV every q12 hours.  Goal trough 15-20 mcg/mL.  Cefepime 2gm q8 Daily SCreatinine  Height: 5\' 11"  (180.3 cm) Weight: 220 lb (99.8 kg) IBW/kg (Calculated) : 75.3  Temp (24hrs), Avg:98.2 F (36.8 C), Min:97.4 F (36.3 C), Max:99.2 F (37.3 C)   Recent Labs Lab 03/01/17 0117 03/01/17 0452  WBC 8.8  --   CREATININE 1.36*  --   LATICACIDVEN  --  3.7*    Estimated Creatinine Clearance: 66.9 mL/min (A) (by C-G formula based on SCr of 1.36 mg/dL (H)).    Allergies  Allergen Reactions  . Cialis [Tadalafil] Other (See Comments)    Reaction:  Headache    Antimicrobials this admission: 7/29 Vancomycin >>  7/29 Cefepime >>  Dose adjustments this admission:  Microbiology results: 7/29 BCx: obtained after abx given  Thank you for allowing pharmacy to be a part of this patient's care.  Minda Ditto PharmD Pager [phone] 03/01/2017, 6:31 AM

## 2017-03-02 ENCOUNTER — Inpatient Hospital Stay (HOSPITAL_COMMUNITY): Payer: BLUE CROSS/BLUE SHIELD

## 2017-03-02 ENCOUNTER — Ambulatory Visit: Payer: BLUE CROSS/BLUE SHIELD | Admitting: Radiation Oncology

## 2017-03-02 DIAGNOSIS — J9601 Acute respiratory failure with hypoxia: Secondary | ICD-10-CM

## 2017-03-02 DIAGNOSIS — I340 Nonrheumatic mitral (valve) insufficiency: Secondary | ICD-10-CM

## 2017-03-02 DIAGNOSIS — J189 Pneumonia, unspecified organism: Secondary | ICD-10-CM

## 2017-03-02 DIAGNOSIS — J90 Pleural effusion, not elsewhere classified: Secondary | ICD-10-CM

## 2017-03-02 DIAGNOSIS — N289 Disorder of kidney and ureter, unspecified: Secondary | ICD-10-CM

## 2017-03-02 DIAGNOSIS — I4891 Unspecified atrial fibrillation: Secondary | ICD-10-CM

## 2017-03-02 LAB — CBC WITH DIFFERENTIAL/PLATELET
BASOS ABS: 0 10*3/uL (ref 0.0–0.1)
Basophils Relative: 0 %
Eosinophils Absolute: 0 10*3/uL (ref 0.0–0.7)
Eosinophils Relative: 0 %
HEMATOCRIT: 27.4 % — AB (ref 39.0–52.0)
HEMOGLOBIN: 8.8 g/dL — AB (ref 13.0–17.0)
LYMPHS PCT: 4 %
Lymphs Abs: 0.5 10*3/uL — ABNORMAL LOW (ref 0.7–4.0)
MCH: 32.4 pg (ref 26.0–34.0)
MCHC: 32.1 g/dL (ref 30.0–36.0)
MCV: 100.7 fL — AB (ref 78.0–100.0)
Monocytes Absolute: 0.5 10*3/uL (ref 0.1–1.0)
Monocytes Relative: 4 %
NEUTROS ABS: 10.7 10*3/uL — AB (ref 1.7–7.7)
NEUTROS PCT: 92 %
Platelets: 111 10*3/uL — ABNORMAL LOW (ref 150–400)
RBC: 2.72 MIL/uL — AB (ref 4.22–5.81)
RDW: 20.7 % — ABNORMAL HIGH (ref 11.5–15.5)
WBC: 11.6 10*3/uL — AB (ref 4.0–10.5)

## 2017-03-02 LAB — GLUCOSE, CAPILLARY
GLUCOSE-CAPILLARY: 205 mg/dL — AB (ref 65–99)
GLUCOSE-CAPILLARY: 303 mg/dL — AB (ref 65–99)
Glucose-Capillary: 265 mg/dL — ABNORMAL HIGH (ref 65–99)
Glucose-Capillary: 274 mg/dL — ABNORMAL HIGH (ref 65–99)

## 2017-03-02 LAB — EXPECTORATED SPUTUM ASSESSMENT W REFEX TO RESP CULTURE

## 2017-03-02 LAB — COMPREHENSIVE METABOLIC PANEL
ALBUMIN: 3.4 g/dL — AB (ref 3.5–5.0)
ALT: 1380 U/L — AB (ref 17–63)
AST: 1448 U/L — AB (ref 15–41)
Alkaline Phosphatase: 76 U/L (ref 38–126)
Anion gap: 14 (ref 5–15)
BILIRUBIN TOTAL: 1.2 mg/dL (ref 0.3–1.2)
BUN: 39 mg/dL — AB (ref 6–20)
CHLORIDE: 100 mmol/L — AB (ref 101–111)
CO2: 20 mmol/L — ABNORMAL LOW (ref 22–32)
CREATININE: 1.62 mg/dL — AB (ref 0.61–1.24)
Calcium: 8.8 mg/dL — ABNORMAL LOW (ref 8.9–10.3)
GFR calc Af Amer: 51 mL/min — ABNORMAL LOW (ref >60)
GFR calc non Af Amer: 44 mL/min — ABNORMAL LOW (ref >60)
GLUCOSE: 289 mg/dL — AB (ref 65–99)
POTASSIUM: 3.3 mmol/L — AB (ref 3.5–5.1)
Sodium: 134 mmol/L — ABNORMAL LOW (ref 135–145)
TOTAL PROTEIN: 6.6 g/dL (ref 6.5–8.1)

## 2017-03-02 LAB — LACTIC ACID, PLASMA
LACTIC ACID, VENOUS: 3.9 mmol/L — AB (ref 0.5–1.9)
Lactic Acid, Venous: 3.4 mmol/L (ref 0.5–1.9)

## 2017-03-02 LAB — ECHOCARDIOGRAM COMPLETE
Height: 71 in
Weight: 3520 oz

## 2017-03-02 LAB — EXPECTORATED SPUTUM ASSESSMENT W GRAM STAIN, RFLX TO RESP C

## 2017-03-02 MED ORDER — POTASSIUM CHLORIDE CRYS ER 20 MEQ PO TBCR
40.0000 meq | EXTENDED_RELEASE_TABLET | Freq: Once | ORAL | Status: AC
  Administered 2017-03-02: 40 meq via ORAL
  Filled 2017-03-02: qty 2

## 2017-03-02 MED ORDER — IPRATROPIUM BROMIDE 0.02 % IN SOLN
0.5000 mg | Freq: Three times a day (TID) | RESPIRATORY_TRACT | Status: DC
  Administered 2017-03-03 – 2017-03-10 (×21): 0.5 mg via RESPIRATORY_TRACT
  Filled 2017-03-02 (×21): qty 2.5

## 2017-03-02 MED ORDER — SODIUM CHLORIDE 0.9 % IV BOLUS (SEPSIS)
500.0000 mL | Freq: Once | INTRAVENOUS | Status: AC
  Administered 2017-03-02: 500 mL via INTRAVENOUS

## 2017-03-02 MED ORDER — LEVALBUTEROL HCL 1.25 MG/0.5ML IN NEBU
1.2500 mg | INHALATION_SOLUTION | Freq: Three times a day (TID) | RESPIRATORY_TRACT | Status: DC
  Administered 2017-03-03 – 2017-03-10 (×21): 1.25 mg via RESPIRATORY_TRACT
  Filled 2017-03-02 (×21): qty 0.5

## 2017-03-02 MED ORDER — VANCOMYCIN HCL IN DEXTROSE 750-5 MG/150ML-% IV SOLN
750.0000 mg | Freq: Two times a day (BID) | INTRAVENOUS | Status: DC
  Administered 2017-03-02 – 2017-03-04 (×4): 750 mg via INTRAVENOUS
  Filled 2017-03-02 (×4): qty 150

## 2017-03-02 MED ORDER — INSULIN GLARGINE 100 UNIT/ML ~~LOC~~ SOLN
10.0000 [IU] | Freq: Every day | SUBCUTANEOUS | Status: DC
  Administered 2017-03-02 – 2017-03-03 (×2): 10 [IU] via SUBCUTANEOUS
  Filled 2017-03-02 (×2): qty 0.1

## 2017-03-02 MED ORDER — INSULIN ASPART 100 UNIT/ML ~~LOC~~ SOLN
4.0000 [IU] | Freq: Once | SUBCUTANEOUS | Status: AC
  Administered 2017-03-02: 4 [IU] via SUBCUTANEOUS

## 2017-03-02 MED ORDER — ORAL CARE MOUTH RINSE
15.0000 mL | Freq: Two times a day (BID) | OROMUCOSAL | Status: DC
  Administered 2017-03-02 – 2017-03-03 (×3): 15 mL via OROMUCOSAL

## 2017-03-02 NOTE — Progress Notes (Signed)
Pharmacy Antibiotic Note  JAMAHL Avila is a 66 y.o. male admitted on 03/01/2017 with post-obstructive pneumonia.  Pharmacy has been consulted for Vancomycin & Zosyn dosing.  Today, 03/02/2017 SCr increased 1.28 > 1.62 CrCl decreased 47 ml/min/1.75m2 (normalized)  Plan:  Decrease vancomycin to 750mg  IV q12h  Check trough at steady state, goal 15-20 mcg/ml  Continue Zosyn 3.375gm IV Q8h to be infused over 4hrs  Daily SCreatinine  Monitor renal function and cx data   Height: 5\' 11"  (180.3 cm) Weight: 220 lb (99.8 kg) IBW/kg (Calculated) : 75.3  Temp (24hrs), Avg:97.7 F (36.5 C), Min:96.5 F (35.8 C), Max:98.6 F (37 C)   Recent Labs Lab 03/01/17 0117 03/01/17 0452 03/01/17 0757 03/01/17 1451 03/01/17 1516 03/02/17 0500  WBC 8.8  --   --   --   --  11.6*  CREATININE 1.36*  --   --  1.28*  --  1.62*  LATICACIDVEN  --  3.7* 2.2*  --  2.7*  --     Estimated Creatinine Clearance: 56.2 mL/min (A) (by C-G formula based on SCr of 1.62 mg/dL (H)).    Allergies  Allergen Reactions  . Cialis [Tadalafil] Other (See Comments)    Reaction:  Headache    Antimicrobials this admission: 7/29 Vancomycin >>  7/29 Cefepime >>7/29 7/29 Zosyn>>  Dose adjustments this admission: 7/30 decrease vanc from 1g to 750mg  q12h for rising SCr  Microbiology results: 7/29 BCx: obtained after abx given: ngtd 7/29 MRSA PCR: neg 7/29 HIV Ab: NR 7/29 Strep Ab: neg 7/30 Sputum:   Thank you for allowing pharmacy to be a part of this patient's care.  Peggyann Juba, PharmD, BCPS Pager: [phone] 03/02/2017, 10:51 AM

## 2017-03-02 NOTE — Progress Notes (Signed)
Progress Note  Patient Name: Jesse Avila Date of Encounter: 03/02/2017  Primary Cardiologist: Rockey Situ   Subjective   66 yo with metastatic lung ca Admitted with post obstructive pneumonia, atrial fib,  increasing leg edema and + Troponins.  PMH of CEA, femoral endarterectomy , fem-pop bypass,  Current echo pending Echo Mary 21, 2018 showed -  Nl LV systolic function, grade 1 DD,    Inpatient Medications    Scheduled Meds: . apixaban  5 mg Oral BID  . Chlorhexidine Gluconate Cloth  6 each Topical Q0600  . diltiazem  120 mg Oral Daily  . fenofibrate  160 mg Oral Daily  . furosemide  20 mg Intravenous Daily  . insulin aspart  0-15 Units Subcutaneous TID WC  . ipratropium  0.5 mg Nebulization QID  . levalbuterol  1.25 mg Nebulization QID  . mouth rinse  15 mL Mouth Rinse BID  . methylPREDNISolone (SOLU-MEDROL) injection  60 mg Intravenous Q12H  . metoprolol succinate  100 mg Oral Daily  . rosuvastatin  40 mg Oral QPM   Continuous Infusions: . amiodarone 30 mg/hr (03/02/17 0040)  . piperacillin-tazobactam (ZOSYN)  IV 3.375 g (03/02/17 0602)  . vancomycin Stopped (03/02/17 0601)   PRN Meds: famotidine, guaiFENesin, ondansetron, technetium TC 90M diethylenetriame-pentaacetic acid, zolpidem   Vital Signs    Vitals:   03/02/17 0351 03/02/17 0400 03/02/17 0500 03/02/17 0600  BP:  97/75 118/86 119/88  Pulse:   (!) 30 (!) 32  Resp:  (!) 27 (!) 28 (!) 30  Temp: 98.6 F (37 C)     TempSrc: Oral     SpO2:   91% 94%  Weight:      Height:        Intake/Output Summary (Last 24 hours) at 03/02/17 0755 Last data filed at 03/02/17 0602  Gross per 24 hour  Intake           1065.9 ml  Output              625 ml  Net            440.9 ml   Filed Weights   03/01/17 0113  Weight: 220 lb (99.8 kg)    Telemetry      Atrial fib with HR of 111  Personally Reviewed  ECG     Atrial fib - Personally Reviewed  Physical Exam  Middle age male,   GEN: No acute distress.     Neck: No JVD Cardiac: Irreg. Irreg.  no murmurs, rubs, or gallops.  Respiratory: rhonchi  GI: Soft, nontender, non-distended  MS: No edema; No deformity. Neuro:  Nonfocal  Psych: Normal affect   Labs    Chemistry Recent Labs Lab 03/01/17 0117 03/01/17 1451 03/02/17 0500  NA 136 135 134*  K 3.6 3.2* 3.3*  CL 104 102 100*  CO2 18* 22 20*  GLUCOSE 205* 301* 289*  BUN 27* 30* 39*  CREATININE 1.36* 1.28* 1.62*  CALCIUM 9.0 8.9 8.8*  PROT  --   --  6.6  ALBUMIN  --   --  3.4*  AST  --   --  1,448*  ALT  --   --  1,380*  ALKPHOS  --   --  76  BILITOT  --   --  1.2  GFRNONAA 54* 58* 44*  GFRAA >60 >60 51*  ANIONGAP 14 11 14      Hematology Recent Labs Lab 03/01/17 0117 03/02/17 0500  WBC 8.8 11.6*  RBC  2.70* 2.72*  HGB 8.8* 8.8*  HCT 27.3* 27.4*  MCV 101.1* 100.7*  MCH 32.6 32.4  MCHC 32.2 32.1  RDW 20.8* 20.7*  PLT 135* 111*    Cardiac Enzymes Recent Labs Lab 03/01/17 0134 03/01/17 0539 03/01/17 1245 03/01/17 2047  TROPONINI 0.14* 0.23* 0.40* 0.36*   No results for input(s): TROPIPOC in the last 168 hours.   BNP Recent Labs Lab 03/01/17 0134  BNP 503.1*     DDimer  Recent Labs Lab 03/01/17 1245  DDIMER 4.06*     Radiology    Dg Chest 2 View  Result Date: 03/01/2017 CLINICAL DATA:  Shortness of breath for 4 weeks and worsening. History of atrial fibrillation, lung cancer, diabetes, previous smoker EXAM: CHEST  2 VIEW COMPARISON:  CT chest 02/06/2017.  Chest 02/02/2017 FINDINGS: Shallow inspiration. Cardiac enlargement. Increasing opacities on the left likely representing a combination of known left hilar mass and developing consolidation or postobstructive change in the left lung. Probable small left pleural effusion. Right lung is grossly clear and expanded. Power port type central venous catheter with tip over the cavoatrial junction. No pneumothorax. Calcification of the aorta. IMPRESSION: Increasing opacities in the left lung likely  representing combination of known left hilar mass with developing consolidation or postobstructive change in the left lung. Probable small left pleural effusion. Electronically Signed   By: Lucienne Capers M.D.   On: 03/01/2017 02:48   Nm Pulmonary Perf And Vent  Result Date: 03/01/2017 CLINICAL DATA:  Shortness of breath with exertion for 4 weeks. History of left-sided cancer status post chemotherapy. Patient receiving radiation. EXAM: NUCLEAR MEDICINE VENTILATION - PERFUSION LUNG SCAN TECHNIQUE: Ventilation images were obtained in multiple projections using inhaled aerosol Tc-45m DTPA. Perfusion images were obtained in multiple projections after intravenous injection of Tc-76m MAA. RADIOPHARMACEUTICALS:  33.0 mCi Technetium-76m DTPA aerosol inhalation and 4.2 mCi Technetium-56m MAA IV COMPARISON:  None. FINDINGS: Ventilation: Multiple small ventilation defects in the right lung. Multiple large ventilation defects in the left lung, correlating with recent CT and plain film findings. Perfusion: No significant perfusion abnormalities in the right lung. Large perfusion defects on the left match the ventilation abnormalities. IMPRESSION: Multiple matched defects in the left lung correlate with x-ray findings. As a result, this is an intermediate probability V/Q scan. Electronically Signed   By: Dorise Bullion III M.D   On: 03/01/2017 20:20    Cardiac Studies      Patient Profile     66 y.o. male  strip lung cancer who presents with postobstructive pneumonia and rapid atrial fibrillation.  Assessment & Plan    1. Atrial fibrillation The patient has had atrial fibrillation for the past several months. He's been on Eliquis  At this point, I do not think he needs to be urgently cardioverted because he is basically asymptomatic. If we do a cardioversion, he'll need to remain on anticoagulation for at least 3-4 weeks following the cardioversion.   I'm not sure whether or not he will need any surgical  procedures for his metastatic lung cancer but doing a cardioversion which would require continued anticoagulation might not be advisable at this point.  I her likelihood of successful cardioversion will be increased if we can further treat his postobstructive pneumonia.  Continue amiodarone for now. Continue Cardizem and metoprolol. Echocardiogram has been ordered.    Signed, Mertie Moores, MD  03/02/2017, 7:55 AM

## 2017-03-02 NOTE — Progress Notes (Signed)
CRITICAL VALUE ALERT  Critical Value:  Lactic acid 3.9  Date & Time Notied:  17:12, 03/02/2018  Provider Notified: Dr. Quincy Simmonds  Orders Received/Actions taken:

## 2017-03-02 NOTE — Progress Notes (Addendum)
PROGRESS NOTE [facility]   Jesse Avila   TZG:[id number] DOB: 23-Sep-1950  DOA: 03/01/2017 PCP: [name], MD   Brief Narrative:  66 year old male with history of small cell lung cancer, hypertension, PAF, PAD, diabetes mellitus presented to the emergency department complaining of shortness of breath. On initial evaluation he was found to have A. fib with RVR patient was started on the Cardizem drip and given Lasix due to some signs of volume overload. Home Eliqius was continued. On chest x-ray there was a concern for possible pneumonia. Patient was started on vancomycin and Zosyn and IV steroids were given for bronchospasm. Patient was admitted to the stepdown unit for possible postobstructive pneumonia, COPD exacerbation with +/-40 and overload. Cardiology was consulted given patient's heart rate not improving significantly on Cardizem drip subsequently started on amiodarone infusion. PCCM has been consulted for evaluation of post obstructive pneumonia. Hospital course complicated by acute renal failure and elevated d-dimer with intermediate VQ scan findings.  Subjective: Patient seen and examined with wife at bedside, patient is sitting up in chair receiving nebulization therapy. Patient report feeling significantly improved from yesterday. His leg edema has decreased and he is denying any chest pain, palpitations and dizziness. Cough is still present but has improved. Patient afebrile overnight and no acute events.  Assessment & Plan: Acute respiratory failure with hypoxia - multifactorial in view of mild CHF exacerbation, A. fib with RVR, possible postobstructive pneumonia and COPD Patient was started on IV cefepime which was switched to Zosyn on 03/01/17 Cardiology input appreciated, d-dimer was ordered which was elevated, likely to be in setting of acute inflammatory process although will need to rule out PE given cancer. VQ scan was ordered which came back with intermediate  possibility of PE. Patient already on oral anticoagulation (Eliquis) PCCM consulted for possible postobstructive pneumonia Will continue Solu-Medrol and nebulizer for now Wean O2 supplementation to keep O2 sats above 91% Patient was given Lasix creatinine has further increase I will defer to cardiology for further recommendations.  PAF - remains in Afib, rate slight better controlled  CHADSVASc 5 on Eliquis Initially started on Cardizem drip and metoprolol Now on amiodarone infusion, LFT elevated will discuss with cardiology if may need to stop or adjust dose  Continue oral Cardizem and metoprolol Echocardiogram has been ordered to rule out pericardial effusion - pending  Cardiology thinking on cardioversion after infection treatment.   Transaminates  ? Acute illness vs medication (Amio)  Check hepatitis panel  RUQ [place]   AKI  Hypoperfusion vs Cardiorenal syndrome  Patient on low dose Lasix, will continue for now as still with signs of fluid overload  Monitor BMP in AM   Elevated troponin Likely demand ischemia No chest pain or acute EKG changes Part cardiology recommendations  Small cell lung cancer - followed by Dr Mohammed/Dr Dwana Curd  Scheduled to start radiation therapy on 03/03/17 Sent mssg to Dr Sondra Come   DVT prophylaxis: Eliquis  Code Status: FULL  Family Communication: Wife at bedside  Disposition Plan: Home when medically stable, will need PT eval when HR is better contolled  Consultants:   Cardiology   PCCM   Procedures:  ECHO pending   Antimicrobials: Anti-infectives    Start     Dose/Rate Route Frequency Ordered Stop   03/02/17 1800  vancomycin (VANCOCIN) IVPB 750 mg/150 ml premix     750 mg 150 mL/hr over 60 Minutes Intravenous Every 12 hours 03/02/17 1050     03/01/17 2200  piperacillin-tazobactam (ZOSYN) IVPB 3.375  g     3.375 g 12.5 mL/hr over 240 Minutes Intravenous Every 8 hours 03/01/17 1457     03/01/17 1800  vancomycin (VANCOCIN) IVPB 1000  mg/200 mL premix  Status:  Discontinued     1,000 mg 200 mL/hr over 60 Minutes Intravenous Every 12 hours 03/01/17 0723 03/02/17 1050   03/01/17 1200  ceFEPIme (MAXIPIME) 2 g in dextrose 5 % 50 mL IVPB  Status:  Discontinued     2 g 100 mL/hr over 30 Minutes Intravenous Every 8 hours 03/01/17 0712 03/01/17 1448   03/01/17 0800  vancomycin (VANCOCIN) IVPB 1000 mg/200 mL premix     1,000 mg 200 mL/hr over 60 Minutes Intravenous  Once 03/01/17 0718 03/01/17 0915   03/01/17 0330  vancomycin (VANCOCIN) IVPB 1000 mg/200 mL premix     1,000 mg 200 mL/hr over 60 Minutes Intravenous  Once 03/01/17 0317 03/02/17 0708   03/01/17 0330  ceFEPIme (MAXIPIME) 2 g in dextrose 5 % 50 mL IVPB     2 g 100 mL/hr over 30 Minutes Intravenous  Once 03/01/17 0317 03/02/17 0708        Objective: Vitals:   03/02/17 0700 03/02/17 0800 03/02/17 0818 03/02/17 0900  BP: (!) 89/52 120/85  (!) 119/91  Pulse: 94 (!) 117  (!) 117  Resp: (!) 25 (!) 24  (!) 27  Temp:  (!) 96.5 F (35.8 C)    TempSrc:  Axillary    SpO2: 95% 95% 95% 98%  Weight:      Height:        Intake/Output Summary (Last 24 hours) at 03/02/17 0914 Last data filed at 03/02/17 0900  Gross per 24 hour  Intake             1356 ml  Output              825 ml  Net              531 ml   Filed Weights   03/01/17 0113  Weight: 99.8 kg (220 lb)    Examination:  General exam: NAD HEENT: OP moist and clear Respiratory system: Good air entry, bibasilar crackles, no wheezing  Cardiovascular system: Irr Irr S1S2, no murmurs or gallops  Gastrointestinal system: Abdomen is nondistended, soft and nontender. + BS  Central nervous system: Alert and oriented. No focal neurological deficits. Extremities: 1+ LE pitting edema.  Skin: No rashes Psychiatry: Judgement and insight appear normal. Mood & affect appropriate.    Data Reviewed: I have personally reviewed following labs and imaging studies  CBC:  Recent Labs Lab 03/01/17 0117  03/02/17 0500  WBC 8.8 11.6*  NEUTROABS 7.2 10.7*  HGB 8.8* 8.8*  HCT 27.3* 27.4*  MCV 101.1* 100.7*  PLT 135* 381*   Basic Metabolic Panel:  Recent Labs Lab 03/01/17 0117 03/01/17 1451 03/02/17 0500  NA 136 135 134*  K 3.6 3.2* 3.3*  CL 104 102 100*  CO2 18* 22 20*  GLUCOSE 205* 301* 289*  BUN 27* 30* 39*  CREATININE 1.36* 1.28* 1.62*  CALCIUM 9.0 8.9 8.8*   GFR: Estimated Creatinine Clearance: 56.2 mL/min (A) (by C-G formula based on SCr of 1.62 mg/dL (H)). Liver Function Tests:  Recent Labs Lab 03/02/17 0500  AST 1,448*  ALT 1,380*  ALKPHOS 76  BILITOT 1.2  PROT 6.6  ALBUMIN 3.4*   No results for input(s): LIPASE, AMYLASE in the last 168 hours. No results for input(s): AMMONIA in the last 168 hours. Coagulation  Profile: No results for input(s): INR, PROTIME in the last 168 hours. Cardiac Enzymes:  Recent Labs Lab 03/01/17 0134 03/01/17 0539 03/01/17 1245 03/01/17 2047  TROPONINI 0.14* 0.23* 0.40* 0.36*   BNP (last 3 results) No results for input(s): PROBNP in the last 8760 hours. HbA1C: No results for input(s): HGBA1C in the last 72 hours. CBG:  Recent Labs Lab 03/01/17 0757 03/01/17 1207 03/01/17 1620 03/01/17 2130 03/02/17 0743  GLUCAP 196* 260* 277* 244* 265*   Lipid Profile: No results for input(s): CHOL, HDL, LDLCALC, TRIG, CHOLHDL, LDLDIRECT in the last 72 hours. Thyroid Function Tests: No results for input(s): TSH, T4TOTAL, FREET4, T3FREE, THYROIDAB in the last 72 hours. Anemia Panel: No results for input(s): VITAMINB12, FOLATE, FERRITIN, TIBC, IRON, RETICCTPCT in the last 72 hours. Sepsis Labs:  Recent Labs Lab 03/01/17 0452 03/01/17 0757 03/01/17 1516  LATICACIDVEN 3.7* 2.2* 2.7*    Recent Results (from the past 240 hour(s))  Culture, blood (routine x 2) Call MD if unable to obtain prior to antibiotics being given     Status: None (Preliminary result)   Collection Time: 03/01/17  5:30 AM  Result Value Ref Range Status    Specimen Description BLOOD BLOOD RIGHT FOREARM  Final   Special Requests IN PEDIATRIC BOTTLE Blood Culture adequate volume  Final   Culture   Final    NO GROWTH < 12 HOURS Performed at [facility], [place] [address], [place], [place] [address]    Report Status PENDING  Incomplete  Culture, blood (routine x 2) Call MD if unable to obtain prior to antibiotics being given     Status: None (Preliminary result)   Collection Time: 03/01/17  5:39 AM  Result Value Ref Range Status   Specimen Description BLOOD BLOOD LEFT FOREARM  Final   Special Requests IN PEDIATRIC BOTTLE Blood Culture adequate volume  Final   Culture   Final    NO GROWTH < 12 HOURS Performed at [facility], [place] [address], [place], [place] [address]    Report Status PENDING  Incomplete  MRSA PCR Screening     Status: None   Collection Time: 03/01/17  2:51 PM  Result Value Ref Range Status   MRSA by PCR NEGATIVE NEGATIVE Final    Comment:        The GeneXpert MRSA Assay (FDA approved for NASAL specimens only), is one component of a comprehensive MRSA colonization surveillance program. It is not intended to diagnose MRSA infection nor to guide or monitor treatment for MRSA infections.   Culture, sputum-assessment     Status: None   Collection Time: 03/02/17  7:51 AM  Result Value Ref Range Status   Specimen Description EXPECTORATED SPUTUM  Final   Special Requests NONE  Final   Sputum evaluation THIS SPECIMEN IS ACCEPTABLE FOR SPUTUM CULTURE  Final   Report Status 03/02/2017 FINAL  Final     Radiology Studies: Dg Chest 2 View  Result Date: 03/01/2017 CLINICAL DATA:  Shortness of breath for 4 weeks and worsening. History of atrial fibrillation, lung cancer, diabetes, previous smoker EXAM: CHEST  2 VIEW COMPARISON:  CT chest 02/06/2017.  Chest 02/02/2017 FINDINGS: Shallow inspiration. Cardiac enlargement. Increasing opacities on the left likely representing a combination of known left hilar mass and  developing consolidation or postobstructive change in the left lung. Probable small left pleural effusion. Right lung is grossly clear and expanded. Power port type central venous catheter with tip over the cavoatrial junction. No pneumothorax. Calcification  of the aorta. IMPRESSION: Increasing opacities in the left lung likely representing combination of known left hilar mass with developing consolidation or postobstructive change in the left lung. Probable small left pleural effusion. Electronically Signed   By: Lucienne Capers M.D.   On: 03/01/2017 02:48   Nm Pulmonary Perf And Vent  Result Date: 03/01/2017 CLINICAL DATA:  Shortness of breath with exertion for 4 weeks. History of left-sided cancer status post chemotherapy. Patient receiving radiation. EXAM: NUCLEAR MEDICINE VENTILATION - PERFUSION LUNG SCAN TECHNIQUE: Ventilation images were obtained in multiple projections using inhaled aerosol Tc-83m DTPA. Perfusion images were obtained in multiple projections after intravenous injection of Tc-72m MAA. RADIOPHARMACEUTICALS:  33.0 mCi Technetium-88m DTPA aerosol inhalation and 4.2 mCi Technetium-61m MAA IV COMPARISON:  None. FINDINGS: Ventilation: Multiple small ventilation defects in the right lung. Multiple large ventilation defects in the left lung, correlating with recent CT and plain film findings. Perfusion: No significant perfusion abnormalities in the right lung. Large perfusion defects on the left match the ventilation abnormalities. IMPRESSION: Multiple matched defects in the left lung correlate with x-ray findings. As a result, this is an intermediate probability V/Q scan. Electronically Signed   By: Dorise Bullion III M.D   On: 03/01/2017 20:20      Scheduled Meds: . apixaban  5 mg Oral BID  . Chlorhexidine Gluconate Cloth  6 each Topical Q0600  . diltiazem  120 mg Oral Daily  . fenofibrate  160 mg Oral Daily  . furosemide  20 mg Intravenous Daily  . insulin aspart  0-15 Units  Subcutaneous TID WC  . ipratropium  0.5 mg Nebulization QID  . levalbuterol  1.25 mg Nebulization QID  . mouth rinse  15 mL Mouth Rinse BID  . methylPREDNISolone (SOLU-MEDROL) injection  60 mg Intravenous Q12H  . metoprolol succinate  100 mg Oral Daily  . rosuvastatin  40 mg Oral QPM   Continuous Infusions: . amiodarone 30 mg/hr (03/02/17 0040)  . piperacillin-tazobactam (ZOSYN)  IV 3.375 g (03/02/17 0602)  . vancomycin Stopped (03/02/17 0601)     LOS: 1 day    Time spent: Total of 35 minutes spent with pt, greater than 50% of which was spent in discussion of  treatment, counseling and coordination of care    Chipper Oman, MD Pager: Text Page via [email or web address]  [phone]  If 7PM-7AM, please contact night-coverage [email or web address] Password [email or web address] 03/02/2017, 9:14 AM

## 2017-03-02 NOTE — Consult Note (Signed)
Name: Jesse Avila MRN: [id number] DOB: [birth year]    ADMISSION DATE:  03/01/2017 CONSULTATION DATE:  03/02/17  REFERRING MD :  Dr. Quincy Simmonds   CHIEF COMPLAINT:  Shortness of Breath    HISTORY OF PRESENT ILLNESS:  66 y/o M with a PMH of COPD, LUL Mass (found in 09/2016 in the setting of hoarseness) that was consistent with extensive stage,  T4, N2, M1b small cell lung cancer (followed by Dr. Sondra Come & Dr. Earlie Server - on carboplatin, etoposide, neulasta & radiotherapy) with distant metastases to mediastinum, pancreas and left iliac lesion admitted on 7/29 with a 4 week history of increasing shortness of breath and exertional dyspnea.  He also reported increased fatigue, cough with productive white sputum and bilateral lower extremity swelling.  He was recently admitted 7/2-7/5 for left PNA and AFwRVR (started on apixiban).   He was planned to start empiric whole brain radiation 7/31. His last radiation was approximately 8 weeks ago.   He returns 7/29 with ongoing weakness, SOB, LE swelling, cough with minimal white productive sputum.  The patient denies fevers, nausea, diarrhea.  He states he had one episode of post-tussive vomiting and one episode of chills.  He has noted difficulty with swallowing solids at times and feels as though "things occasionally get hung up".      Initial evaluation found him to have AFwRVR.  He was started on a diltiazem gtt and given lasix.  Home apixaban was continued.  CXR was concerning for possible PNA and he was treated with vancomycin & zosyn.  IV steroids were given for bronchospasm.  He was admitted by [facility] for further evaluation of shortness of breath in the setting of possible PNA, COPD exacerbation & +/- volume overload.  CT of the chest / abd / pelvis with contrast was completed which showed decrease in size of soft tissue density in the left suprahilar region with extension into the AP window, worsened left-sided aeration with new ground-glass opacities, small left  and trace right effusions, bilateral partially calcified pleural plaques, mild cirrhosis, vague left-sided iliac sclerosis & pulmonary artery enlargement.    PCCM consulted for evaluation.    His wife is at bedside.  They have been married for 40 years.  They have one daughter and three grandchildren.  One son who was killed at age 51 in a car accident.  They have never had discussions regarding EOL wishes.    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   has a past surgical history that includes Knee arthroscopy (Right, 11/2009); Meniscus repair (11/2009); STENTS; Endarterectomy (Right, 05/09/2014); abdominal aortagram (N/A, 03/29/2012); percutaneous stent intervention (N/A, 05/10/2012); Colonoscopy; Partial knee arthroplasty (Left, 12/26/2014); Cardiac catheterization (N/A, 03/23/2015); lower extremity angiogram (Bilateral, 03/23/2015); Endarterectomy femoral (Right, 05/17/2015); Femoral-popliteal Bypass Graft (Right, 05/17/2015); Patch angioplasty (Right, 05/17/2015); Endobronchial ultrasound (Bilateral, 09/08/2016); ir generic historical (09/25/2016); and ir generic historical (09/25/2016).  Prior to Admission medications   Medication Sig Start Date End Date Taking? Authorizing Provider  amoxicillin (AMOXIL) 500 MG capsule Take 500 mg by mouth 4 (four) times daily. 02/26/17  Yes [provider]  apixaban (ELIQUIS) 5 MG TABS tablet  Take 1 tablet (5 mg total) by mouth 2 (two) times daily. 10/22/16  Yes Eugenie Filler, MD  Blood Glucose Monitoring Suppl (ACCU-CHEK NANO SMARTVIEW) w/Device KIT by Does not apply route.   Yes [provider]  diltiazem (CARDIZEM CD) 120 MG 24 hr capsule Take 1 capsule (120 mg total) by mouth daily. 10/23/16  Yes Eugenie Filler, MD  famotidine (PEPCID) 10 MG tablet Take 20 mg by mouth daily as needed for heartburn or indigestion.   Yes [provider]  fenofibrate 160 MG tablet Take 160 mg by mouth daily.  01/23/17  Yes [provider]  glimepiride (AMARYL) 4 MG tablet Take 4 mg by mouth daily with breakfast.   Yes [provider]  guaiFENesin (ROBITUSSIN) 100 MG/5ML liquid Take 200 mg by mouth 3 (three) times daily as needed for cough.   Yes [provider]  metFORMIN (GLUCOPHAGE-XR) 500 MG 24 hr tablet Take 500 mg by mouth 3 (three) times daily. 01/07/17  Yes [provider]  metoprolol succinate (TOPROL-XL) 100 MG 24 hr tablet Take 100 mg by mouth daily. 02/17/17  Yes [provider]  ondansetron (ZOFRAN) 4 MG tablet Take 4 mg by mouth every 8 (eight) hours as needed for nausea or vomiting.   Yes [provider]  pioglitazone (ACTOS) 15 MG tablet Take 15 mg by mouth daily.   Yes [provider]  rosuvastatin (CRESTOR) 40 MG tablet Take 40 mg by mouth every evening. For cholesterol   Yes [provider]  zolpidem (AMBIEN) 10 MG tablet Take 10 mg by mouth at bedtime as needed for sleep.  01/26/15  Yes [provider]  oxyCODONE-acetaminophen (PERCOCET/ROXICET) 5-325 MG tablet Take 1 tablet by mouth every 6 (six) hours as needed for severe pain. Patient not taking: Reported on 02/19/2017 12/25/16   Curt Bears, MD    Allergies  Allergen Reactions  . Cialis [Tadalafil] Other (See Comments)    Reaction:  Headache     FAMILY HISTORY:  family history includes Diabetes in his mother; Heart attack  in his mother; Heart disease in his mother; Hyperlipidemia in his mother and sister; Hypertension in his father and mother; Stroke in his sister.  SOCIAL HISTORY:  reports that he has quit smoking. He smoked 0.00 packs per day for 0.00 years. He has never used smokeless tobacco. He reports that he drinks alcohol. He reports that he does not use drugs.  REVIEW OF SYSTEMS:  POSITIVES IN BOLD Constitutional: Negative for fever, chills x1 episode, weight loss, malaise/fatigue and diaphoresis.  HENT: Negative for hearing loss, ear pain, nosebleeds, congestion, sore throat, neck pain, tinnitus and ear discharge.   Eyes: Negative for blurred vision, double vision, photophobia, pain, discharge and redness.  Respiratory: Negative for cough, hemoptysis, sputum production, shortness of breath, wheezing and stridor.   Cardiovascular: Negative for chest pain, palpitations, orthopnea, claudication, leg swelling and PND.  Gastrointestinal: Negative for heartburn, nausea, vomiting, abdominal pain, diarrhea, constipation, blood in stool and melena.  Genitourinary: Negative for dysuria, urgency, frequency, hematuria and flank pain.  Musculoskeletal: Negative for myalgias, back pain, joint pain and falls.  Skin: Negative for itching and rash.  Neurological: Negative for dizziness, tingling, tremors, sensory change, speech change, focal weakness, seizures, loss of consciousness, weakness and headaches.  Endo/Heme/Allergies: Negative for environmental allergies and polydipsia. Does not bruise/bleed easily.  SUBJECTIVE: As above  VITAL SIGNS: Temp:  [96.5 F (35.8 C)-98.6  F (37 C)] 96.5 F (35.8 C) (07/30 0800) Pulse Rate:  [30-119] 117 (07/30 0900) Resp:  [19-33] 27 (07/30 0900) BP: (89-121)/(52-91) 119/91 (07/30 0900) SpO2:  [77 %-98 %] 98 % (07/30 0900) FiO2 (%):  [50 %] 50 % (07/29 1106)  PHYSICAL EXAMINATION: General: chronically ill appearing male in NAD, sitting up at bedside in chair HEENT: MM  pink/moist, scleral icterus  PSY: calm/appropriate Neuro: AAOx4, speech clear  CV: s1s2 rrr, no m/r/g PULM: even/non-labored, lungs bilaterally bibasilar crackles R>L, diminished on L  LF:YBOF, non-tender, bsx4 active  Extremities: warm/dry, 2+ pitting edema BLE  Skin: no rashes or lesions   Recent Labs Lab 03/01/17 0117 03/01/17 1451 03/02/17 0500  NA 136 135 134*  K 3.6 3.2* 3.3*  CL 104 102 100*  CO2 18* 22 20*  BUN 27* 30* 39*  CREATININE 1.36* 1.28* 1.62*  GLUCOSE 205* 301* 289*     Recent Labs Lab 03/01/17 0117 03/02/17 0500  HGB 8.8* 8.8*  HCT 27.3* 27.4*  WBC 8.8 11.6*  PLT 135* 111*    Dg Chest 2 View  Result Date: 03/01/2017 CLINICAL DATA:  Shortness of breath for 4 weeks and worsening. History of atrial fibrillation, lung cancer, diabetes, previous smoker EXAM: CHEST  2 VIEW COMPARISON:  CT chest 02/06/2017.  Chest 02/02/2017 FINDINGS: Shallow inspiration. Cardiac enlargement. Increasing opacities on the left likely representing a combination of known left hilar mass and developing consolidation or postobstructive change in the left lung. Probable small left pleural effusion. Right lung is grossly clear and expanded. Power port type central venous catheter with tip over the cavoatrial junction. No pneumothorax. Calcification of the aorta. IMPRESSION: Increasing opacities in the left lung likely representing combination of known left hilar mass with developing consolidation or postobstructive change in the left lung. Probable small left pleural effusion. Electronically Signed   By: Lucienne Capers M.D.   On: 03/01/2017 02:48   Nm Pulmonary Perf And Vent  Result Date: 03/01/2017 CLINICAL DATA:  Shortness of breath with exertion for 4 weeks. History of left-sided cancer status post chemotherapy. Patient receiving radiation. EXAM: NUCLEAR MEDICINE VENTILATION - PERFUSION LUNG SCAN TECHNIQUE: Ventilation images were obtained in multiple projections using inhaled  aerosol Tc-64mDTPA. Perfusion images were obtained in multiple projections after intravenous injection of Tc-947mAA. RADIOPHARMACEUTICALS:  33.0 mCi Technetium-9970mPA aerosol inhalation and 4.2 mCi Technetium-72m80m IV COMPARISON:  None. FINDINGS: Ventilation: Multiple small ventilation defects in the right lung. Multiple large ventilation defects in the left lung, correlating with recent CT and plain film findings. Perfusion: No significant perfusion abnormalities in the right lung. Large perfusion defects on the left match the ventilation abnormalities. IMPRESSION: Multiple matched defects in the left lung correlate with x-ray findings. As a result, this is an intermediate probability V/Q scan. Electronically Signed   By: DaviDorise Bullion M.D   On: 03/01/2017 20:20      SIGNIFICANT EVENTS  7/29  Admit with 4 wk hx SOB, concern for PNA, AFwRVR  CULTURES: BCx2 7/29 >>  Sputum 7/30 >>   ANTIBIOTICS: Vancomycin 7/29 >>  Zosyn 7/29 >>   STUDIES:  ECHO 3/18 >> Normal LV size with EF 50%. Basal inferior and inferoseptal severe hypokinesis. Normal RV size and systolic function. No significant valvular abnormalities.  CT Chest / ABD/ Pelvis 7/6 >> decrease in size of soft tissue density in the left suprahilar region with extension into the AP window, worsened left-sided aeration with new ground-glass opacities, small left and trace right  effusions, bilateral partially calcified pleural plaques, mild cirrhosis, vague left-sided iliac sclerosis & pulmonary artery enlargement.    V/Q 7/9 >> multiple matched defects in the left lung that correlate with XRAY findings, intermediate probability V/Q  ASSESSMENT / PLAN:  Discussion:  66 y/o M with COPD and Stage IV small cell lung cancer admitted with 4 week hx of SOB.  He was recently admitted for PNA and AFwRVR.  Concern for possible post-obstructive PNA, volume overload in setting of AFwRVR and COPD exacerbation.        Left Lung Opacities -  suspect post-obstructive process with parapneumonic effusion.  However, must also consider possibility of malignant effusion, radiation pneumonitis given timing.     Left Pleural Effusion   Acute Hypoxic Respiratory Failure - secondary to above   Extensive Stage Small Cell Lung Cancer - followed by Dr. Earlie Server / Dr. Dwana Curd, previously planned for whole brain radiation to start 7/31  Plan: Continue antibiotics as above Follow cultures to maturity  Left effusion assessed at bedside with moderate to large amt pleural fluid Hold apixiban 7/30 in anticipation for left thoracentesis 7/31 Follow intermittent CXR  Continue O2 to support sats > 90% Will send pleural fluid    Elevated LFT's   Plan: Defer to primary  Pending [place] assessment  Noe Gens, NP-C [facility] Pgr: [phone] or if no answer [phone] 03/02/2017, 9:30 AM

## 2017-03-02 NOTE — Progress Notes (Signed)
CRITICAL VALUE ALERT  Critical Value:  Lactic acid 3.4  Date & Time Notied:  11:57  Provider Notified: Dr. Quincy Simmonds  Orders Received/Actions taken: fluid bolus

## 2017-03-02 NOTE — Progress Notes (Signed)
Inpatient Diabetes Program Recommendations  AACE/ADA: New Consensus Statement on Inpatient Glycemic Control (2015)  Target Ranges:  Prepandial:   less than 140 mg/dL      Peak postprandial:   less than 180 mg/dL (1-2 hours)      Critically ill patients:  140 - 180 mg/dL   Results for TEVEN, MITTMAN (MRN [id number]) as of 03/02/2017 11:33  Ref. Range 03/01/2017 07:57 03/01/2017 12:07 03/01/2017 16:20 03/01/2017 21:30 03/02/2017 07:43  Glucose-Capillary Latest Ref Range: 65 - 99 mg/dL 196 (H) 260 (H) 277 (H) 244 (H) 265 (H)   Review of Glycemic Control  Diabetes history: DM 2 Outpatient Diabetes medications: Amaryl 4 mg Daily, Metformin 500 mg tid, Actos 15 mg Daily Current orders for Inpatient glycemic control: Novolog Moderate 0-15 units tid  Inpatient Diabetes Program Recommendations:    Patient receiving IV Solumedrol 60 mg Q12 hours. Glucose trends in the 200's. Please consider low dose basal insulin Lantus 10 units.  Thanks,  Tama Headings RN, MSN, [facility] Inpatient Diabetes Coordinator Team Pager [phone] (8a-5p)

## 2017-03-02 NOTE — Progress Notes (Signed)
  Echocardiogram 2D Echocardiogram has been performed.  Serrina Minogue L Androw 03/02/2017, 12:57 PM

## 2017-03-02 NOTE — Care Management Note (Signed)
Case Management Note  Patient Details  Name: Jesse Avila MRN: [id number] Date of Birth: Feb 24, 1951  Subjective/Objective:   pna                 Action/Plan: Date:  March 02, 2017 Chart reviewed for concurrent status and case management needs. Will continue to follow patient progress. Discharge Planning: following for needs Expected discharge date: [id number] Velva Harman, BSN, [place], [place]  Expected Discharge Date:                  Expected Discharge Plan:  Home/Self Care  In-House Referral:     Discharge planning Services  CM Consult  Post Acute Care Choice:    Choice offered to:     DME Arranged:    DME Agency:     HH Arranged:    HH Agency:     Status of Service:  In process, will continue to follow  If discussed at Long Length of Stay Meetings, dates discussed:    Additional Comments:  Leeroy Cha, RN 03/02/2017, 8:40 AM

## 2017-03-03 ENCOUNTER — Inpatient Hospital Stay (HOSPITAL_COMMUNITY): Payer: BLUE CROSS/BLUE SHIELD

## 2017-03-03 ENCOUNTER — Encounter (HOSPITAL_COMMUNITY): Payer: Self-pay | Admitting: Cardiology

## 2017-03-03 ENCOUNTER — Ambulatory Visit: Payer: BLUE CROSS/BLUE SHIELD | Admitting: Radiation Oncology

## 2017-03-03 ENCOUNTER — Telehealth: Payer: Self-pay | Admitting: Oncology

## 2017-03-03 DIAGNOSIS — I5021 Acute systolic (congestive) heart failure: Secondary | ICD-10-CM

## 2017-03-03 DIAGNOSIS — J90 Pleural effusion, not elsewhere classified: Secondary | ICD-10-CM

## 2017-03-03 LAB — CBC WITH DIFFERENTIAL/PLATELET
BASOS PCT: 0 %
Basophils Absolute: 0 10*3/uL (ref 0.0–0.1)
EOS ABS: 0 10*3/uL (ref 0.0–0.7)
Eosinophils Relative: 0 %
HCT: 26.6 % — ABNORMAL LOW (ref 39.0–52.0)
HEMOGLOBIN: 8.7 g/dL — AB (ref 13.0–17.0)
Lymphocytes Relative: 3 %
Lymphs Abs: 0.3 10*3/uL — ABNORMAL LOW (ref 0.7–4.0)
MCH: 32.2 pg (ref 26.0–34.0)
MCHC: 32.7 g/dL (ref 30.0–36.0)
MCV: 98.5 fL (ref 78.0–100.0)
Monocytes Absolute: 0.3 10*3/uL (ref 0.1–1.0)
Monocytes Relative: 3 %
NEUTROS PCT: 94 %
Neutro Abs: 10 10*3/uL — ABNORMAL HIGH (ref 1.7–7.7)
Platelets: 85 10*3/uL — ABNORMAL LOW (ref 150–400)
RBC: 2.7 MIL/uL — AB (ref 4.22–5.81)
RDW: 20.5 % — ABNORMAL HIGH (ref 11.5–15.5)
WBC: 10.6 10*3/uL — AB (ref 4.0–10.5)

## 2017-03-03 LAB — COMPREHENSIVE METABOLIC PANEL
ALK PHOS: 83 U/L (ref 38–126)
ALT: 1611 U/L — AB (ref 17–63)
AST: 1662 U/L — ABNORMAL HIGH (ref 15–41)
Albumin: 3.4 g/dL — ABNORMAL LOW (ref 3.5–5.0)
Anion gap: 13 (ref 5–15)
BUN: 52 mg/dL — ABNORMAL HIGH (ref 6–20)
CALCIUM: 8.8 mg/dL — AB (ref 8.9–10.3)
CO2: 21 mmol/L — ABNORMAL LOW (ref 22–32)
CREATININE: 1.57 mg/dL — AB (ref 0.61–1.24)
Chloride: 99 mmol/L — ABNORMAL LOW (ref 101–111)
GFR calc Af Amer: 52 mL/min — ABNORMAL LOW (ref >60)
GFR, EST NON AFRICAN AMERICAN: 45 mL/min — AB (ref >60)
Glucose, Bld: 316 mg/dL — ABNORMAL HIGH (ref 65–99)
Potassium: 3.2 mmol/L — ABNORMAL LOW (ref 3.5–5.1)
Sodium: 133 mmol/L — ABNORMAL LOW (ref 135–145)
TOTAL PROTEIN: 6.3 g/dL — AB (ref 6.5–8.1)
Total Bilirubin: 2 mg/dL — ABNORMAL HIGH (ref 0.3–1.2)

## 2017-03-03 LAB — BODY FLUID CELL COUNT WITH DIFFERENTIAL
LYMPHS FL: 39 %
Monocyte-Macrophage-Serous Fluid: 15 % — ABNORMAL LOW (ref 50–90)
Neutrophil Count, Fluid: 46 % — ABNORMAL HIGH (ref 0–25)
Total Nucleated Cell Count, Fluid: 244 cu mm (ref 0–1000)

## 2017-03-03 LAB — GLUCOSE, CAPILLARY
GLUCOSE-CAPILLARY: 412 mg/dL — AB (ref 65–99)
GLUCOSE-CAPILLARY: 373 mg/dL — AB (ref 65–99)
Glucose-Capillary: 278 mg/dL — ABNORMAL HIGH (ref 65–99)
Glucose-Capillary: 340 mg/dL — ABNORMAL HIGH (ref 65–99)
Glucose-Capillary: 389 mg/dL — ABNORMAL HIGH (ref 65–99)

## 2017-03-03 LAB — LACTATE DEHYDROGENASE: LDH: 635 U/L — ABNORMAL HIGH (ref 98–192)

## 2017-03-03 LAB — PROTEIN, TOTAL: Total Protein: 6.6 g/dL (ref 6.5–8.1)

## 2017-03-03 LAB — ACETAMINOPHEN LEVEL

## 2017-03-03 LAB — PROTEIN, PLEURAL OR PERITONEAL FLUID: Total protein, fluid: <3 g/dL

## 2017-03-03 LAB — LACTATE DEHYDROGENASE, PLEURAL OR PERITONEAL FLUID: LD, Fluid: 84 U/L — ABNORMAL HIGH (ref 3–23)

## 2017-03-03 MED ORDER — INSULIN ASPART 100 UNIT/ML ~~LOC~~ SOLN
8.0000 [IU] | Freq: Once | SUBCUTANEOUS | Status: AC
  Administered 2017-03-03: 8 [IU] via SUBCUTANEOUS

## 2017-03-03 MED ORDER — FUROSEMIDE 10 MG/ML IJ SOLN
40.0000 mg | Freq: Two times a day (BID) | INTRAMUSCULAR | Status: DC
  Administered 2017-03-03 – 2017-03-07 (×9): 40 mg via INTRAVENOUS
  Filled 2017-03-03 (×9): qty 4

## 2017-03-03 MED ORDER — METOPROLOL SUCCINATE ER 100 MG PO TB24
100.0000 mg | ORAL_TABLET | Freq: Two times a day (BID) | ORAL | Status: DC
  Administered 2017-03-03 – 2017-03-10 (×14): 100 mg via ORAL
  Filled 2017-03-03 (×2): qty 1
  Filled 2017-03-03 (×2): qty 4
  Filled 2017-03-03 (×2): qty 1
  Filled 2017-03-03 (×2): qty 4
  Filled 2017-03-03 (×6): qty 1
  Filled 2017-03-03: qty 4

## 2017-03-03 MED ORDER — SPIRONOLACTONE 25 MG PO TABS
25.0000 mg | ORAL_TABLET | Freq: Every day | ORAL | Status: DC
  Administered 2017-03-03 – 2017-03-10 (×7): 25 mg via ORAL
  Filled 2017-03-03 (×7): qty 1

## 2017-03-03 NOTE — Progress Notes (Signed)
PROGRESS NOTE [facility]   Jesse Avila   WJX:[id number] DOB: 04-06-1951  DOA: 03/01/2017 PCP: [name], MD   Brief Narrative:  66 year old male with history of small cell lung cancer, hypertension, PAF, PAD, diabetes mellitus presented to the emergency department complaining of shortness of breath. On initial evaluation he was found to have A. fib with RVR patient was started on the Cardizem drip and given Lasix due to some signs of volume overload. Home Eliqius was continued. On chest x-ray there was a concern for possible pneumonia. Patient was started on vancomycin and Zosyn and IV steroids were given for bronchospasm. Patient was admitted to the stepdown unit for possible postobstructive pneumonia, COPD exacerbation with +/-40 and overload. Cardiology was consulted given patient's heart rate not improving significantly on Cardizem drip subsequently started on amiodarone infusion. PCCM has been consulted for evaluation of post obstructive pneumonia. Hospital course complicated by acute renal failure and elevated d-dimer with intermediate VQ scan findings. Pulm planning for thoracentesis.   Subjective: Patient seen and examined, he reported breathing about the same as yesterday. Denies chest pain, palpitations and dizziness. For thoracentesis today   Assessment & Plan: Acute respiratory failure with hypoxia - multifactorial in view of decompensated CHF, A. fib with RVR, possible postobstructive pneumonia, COPD and Pleural effusion  Patient was started on IV cefepime which was switched to Zosyn on 03/01/17 Cardiology input appreciated, d-dimer was ordered which was elevated, likely to be in setting of acute inflammatory process although will needed to rule out PE given cancer. VQ scan was ordered which came back with intermediate possibility of PE. Patient already on oral anticoagulation (Eliquis) PCCM consulted for possible postobstructive pneumonia - input appreciated  Started on  Solu-Medrol which will continue taper for now Wean O2 supplementation to keep O2 sats above 91% Given new finding Lasix has been increased.  Acute systolic HF/Cardiomyopathy EF 20-25% - down from 50% on March 2018 ? If 2/2 to Afib vs Ischemia (diffuse hypokinesis)  Lasix increased, will add aldactone, will increase metoprolol to 200 BID and d/c Cardizem given EF  Cardiology recommendations appreciate - will defer to cardiology for further work up or change in management.   PAF - remains in Afib, rate slight better controlled  CHADSVASc 5 on Eliquis Initially started on Cardizem drip and metoprolol Now on amiodarone infusion, LFT elevated will discuss with cardiology if may need to stop or adjust dose  Increasing metoprolol, d/c Diltiazem  ? DCCV  In future   Transaminates  ? Acute illness vs medication (Amio/Statin/Fenofibrates)  Hepatitis panel pending   Will stop Crestor and Fenofibrate RUQ [place] negative  Check Tylenol levels   AKI - mild improvement  Hypoperfusion vs Cardiorenal syndrome  Lasix increased to improve diuresis  Check Cr in AM   Elevated troponin Likely demand ischemia No chest pain or acute EKG changes Per cardiology recommendations   Small cell lung cancer - followed by Dr Mohammed/Dr Dwana Curd  Scheduled to start radiation therapy on 03/03/17 Sent mssg to Dr Sondra Come   DVT prophylaxis: Eliquis  Code Status: FULL  Family Communication: Wife at bedside  Disposition Plan: Home when medically stable, will need PT eval when HR is better contolled  Consultants:   Cardiology   PCCM   Procedures:  ECHO pending   Antimicrobials: Anti-infectives    Start     Dose/Rate Route Frequency Ordered Stop   03/02/17 1800  vancomycin (VANCOCIN) IVPB 750 mg/150 ml premix     750 mg 150 mL/hr  over 60 Minutes Intravenous Every 12 hours 03/02/17 1050     03/01/17 2200  piperacillin-tazobactam (ZOSYN) IVPB 3.375 g     3.375 g 12.5 mL/hr over 240 Minutes Intravenous  Every 8 hours 03/01/17 1457     03/01/17 1800  vancomycin (VANCOCIN) IVPB 1000 mg/200 mL premix  Status:  Discontinued     1,000 mg 200 mL/hr over 60 Minutes Intravenous Every 12 hours 03/01/17 0723 03/02/17 1050   03/01/17 1200  ceFEPIme (MAXIPIME) 2 g in dextrose 5 % 50 mL IVPB  Status:  Discontinued     2 g 100 mL/hr over 30 Minutes Intravenous Every 8 hours 03/01/17 0712 03/01/17 1448   03/01/17 0800  vancomycin (VANCOCIN) IVPB 1000 mg/200 mL premix     1,000 mg 200 mL/hr over 60 Minutes Intravenous  Once 03/01/17 0718 03/01/17 0915   03/01/17 0330  vancomycin (VANCOCIN) IVPB 1000 mg/200 mL premix     1,000 mg 200 mL/hr over 60 Minutes Intravenous  Once 03/01/17 0317 03/02/17 0708   03/01/17 0330  ceFEPIme (MAXIPIME) 2 g in dextrose 5 % 50 mL IVPB     2 g 100 mL/hr over 30 Minutes Intravenous  Once 03/01/17 0317 03/02/17 0708       Objective: Vitals:   03/03/17 0400 03/03/17 0700 03/03/17 0800 03/03/17 0829  BP: 109/68 134/78 119/86 119/86  Pulse: 98 98 (!) 107 (!) 111  Resp: (!) 31  (!) 22 (!) 28  Temp: (!) 97.5 F (36.4 C)  97.8 F (36.6 C)   TempSrc: Oral  Oral   SpO2: 94% 98% 98% 98%  Weight:      Height:        Intake/Output Summary (Last 24 hours) at 03/03/17 0901 Last data filed at 03/03/17 0800  Gross per 24 hour  Intake           2064.1 ml  Output              450 ml  Net           1614.1 ml   Filed Weights   03/01/17 0113  Weight: 99.8 kg (220 lb)    Examination:  General: Pt is alert, awake, not in acute distress Cardiovascular: RRR, S1/S2 +SM, no rub or gallops  Respiratory: Good air entry, bibasilar crackle, no wheezing Abdominal: Soft, NT, ND, bowel sounds + Extremities: no edema.   Data Reviewed: I have personally reviewed following labs and imaging studies  CBC:  Recent Labs Lab 03/01/17 0117 03/02/17 0500  WBC 8.8 11.6*  NEUTROABS 7.2 10.7*  HGB 8.8* 8.8*  HCT 27.3* 27.4*  MCV 101.1* 100.7*  PLT 135* 062*   Basic Metabolic  Panel:  Recent Labs Lab 03/01/17 0117 03/01/17 1451 03/02/17 0500 03/03/17 0711  NA 136 135 134* 133*  K 3.6 3.2* 3.3* 3.2*  CL 104 102 100* 99*  CO2 18* 22 20* 21*  GLUCOSE 205* 301* 289* 316*  BUN 27* 30* 39* 52*  CREATININE 1.36* 1.28* 1.62* 1.57*  CALCIUM 9.0 8.9 8.8* 8.8*   GFR: Estimated Creatinine Clearance: 58 mL/min (A) (by C-G formula based on SCr of 1.57 mg/dL (H)). Liver Function Tests:  Recent Labs Lab 03/02/17 0500 03/03/17 0711  AST 1,448* 1,662*  ALT 1,380* 1,611*  ALKPHOS 76 83  BILITOT 1.2 2.0*  PROT 6.6 6.3*  ALBUMIN 3.4* 3.4*   No results for input(s): LIPASE, AMYLASE in the last 168 hours. No results for input(s): AMMONIA in the last 168 hours. Coagulation  Profile: No results for input(s): INR, PROTIME in the last 168 hours. Cardiac Enzymes:  Recent Labs Lab 03/01/17 0134 03/01/17 0539 03/01/17 1245 03/01/17 2047  TROPONINI 0.14* 0.23* 0.40* 0.36*   BNP (last 3 results) No results for input(s): PROBNP in the last 8760 hours. HbA1C: No results for input(s): HGBA1C in the last 72 hours. CBG:  Recent Labs Lab 03/02/17 0743 03/02/17 1145 03/02/17 1620 03/02/17 2107 03/03/17 0802  GLUCAP 265* 274* 205* 303* 278*   Lipid Profile: No results for input(s): CHOL, HDL, LDLCALC, TRIG, CHOLHDL, LDLDIRECT in the last 72 hours. Thyroid Function Tests: No results for input(s): TSH, T4TOTAL, FREET4, T3FREE, THYROIDAB in the last 72 hours. Anemia Panel: No results for input(s): VITAMINB12, FOLATE, FERRITIN, TIBC, IRON, RETICCTPCT in the last 72 hours. Sepsis Labs:  Recent Labs Lab 03/01/17 0757 03/01/17 1516 03/02/17 1105 03/02/17 1617  LATICACIDVEN 2.2* 2.7* 3.4* 3.9*    Recent Results (from the past 240 hour(s))  Culture, blood (routine x 2) Call MD if unable to obtain prior to antibiotics being given     Status: None (Preliminary result)   Collection Time: 03/01/17  5:30 AM  Result Value Ref Range Status   Specimen Description  BLOOD BLOOD RIGHT FOREARM  Final   Special Requests IN PEDIATRIC BOTTLE Blood Culture adequate volume  Final   Culture   Final    NO GROWTH 1 DAY Performed at [facility], [place] [address] [place], [place] [address]    Report Status PENDING  Incomplete  Culture, blood (routine x 2) Call MD if unable to obtain prior to antibiotics being given     Status: None (Preliminary result)   Collection Time: 03/01/17  5:39 AM  Result Value Ref Range Status   Specimen Description BLOOD BLOOD LEFT FOREARM  Final   Special Requests IN PEDIATRIC BOTTLE Blood Culture adequate volume  Final   Culture   Final    NO GROWTH 1 DAY Performed at [facility], [place] [address], [place], [place] [address]    Report Status PENDING  Incomplete  MRSA PCR Screening     Status: None   Collection Time: 03/01/17  2:51 PM  Result Value Ref Range Status   MRSA by PCR NEGATIVE NEGATIVE Final    Comment:        The GeneXpert MRSA Assay (FDA approved for NASAL specimens only), is one component of a comprehensive MRSA colonization surveillance program. It is not intended to diagnose MRSA infection nor to guide or monitor treatment for MRSA infections.   Culture, sputum-assessment     Status: None   Collection Time: 03/02/17  7:51 AM  Result Value Ref Range Status   Specimen Description EXPECTORATED SPUTUM  Final   Special Requests NONE  Final   Sputum evaluation THIS SPECIMEN IS ACCEPTABLE FOR SPUTUM CULTURE  Final   Report Status 03/02/2017 FINAL  Final  Culture, respiratory (NON-Expectorated)     Status: None (Preliminary result)   Collection Time: 03/02/17  7:51 AM  Result Value Ref Range Status   Specimen Description EXPECTORATED SPUTUM  Final   Special Requests NONE Reflexed from [id number]  Final   Gram Stain   Final    NO WBC SEEN FEW BUDDING YEAST SEEN RARE GRAM NEGATIVE RODS RARE GRAM POSITIVE RODS FEW SQUAMOUS EPITHELIAL CELLS PRESENT Performed at [facility], [address]  [place], [place] [address]    Culture PENDING  Incomplete   Report Status PENDING  Incomplete  Radiology Studies: Nm Pulmonary Perf And Vent  Result Date: 03/01/2017 CLINICAL DATA:  Shortness of breath with exertion for 4 weeks. History of left-sided cancer status post chemotherapy. Patient receiving radiation. EXAM: NUCLEAR MEDICINE VENTILATION - PERFUSION LUNG SCAN TECHNIQUE: Ventilation images were obtained in multiple projections using inhaled aerosol Tc-87m DTPA. Perfusion images were obtained in multiple projections after intravenous injection of Tc-29m MAA. RADIOPHARMACEUTICALS:  33.0 mCi Technetium-64m DTPA aerosol inhalation and 4.2 mCi Technetium-42m MAA IV COMPARISON:  None. FINDINGS: Ventilation: Multiple small ventilation defects in the right lung. Multiple large ventilation defects in the left lung, correlating with recent CT and plain film findings. Perfusion: No significant perfusion abnormalities in the right lung. Large perfusion defects on the left match the ventilation abnormalities. IMPRESSION: Multiple matched defects in the left lung correlate with x-ray findings. As a result, this is an intermediate probability V/Q scan. Electronically Signed   By: Dorise Bullion III M.D   On: 03/01/2017 20:20   US Abdomen Limited Ruq  Result Date: 03/02/2017 CLINICAL DATA:  Transaminitis EXAM: ULTRASOUND ABDOMEN LIMITED RIGHT UPPER QUADRANT COMPARISON:  03/01/2017 FINDINGS: Gallbladder: No gallstones or wall thickening visualized. No sonographic Murphy sign noted by sonographer. Common bile duct: Diameter: 2.4 mm Liver: No focal lesion identified. Within normal limits in parenchymal echogenicity. IMPRESSION: 1. No acute findings identified. Electronically Signed   By: Kerby Moors M.D.   On: 03/02/2017 16:29    Scheduled Meds: . Chlorhexidine Gluconate Cloth  6 each Topical Q0600  . diltiazem  120 mg Oral Daily  . fenofibrate  160 mg Oral Daily  . furosemide  40 mg Intravenous BID  .  insulin aspart  0-15 Units Subcutaneous TID WC  . insulin glargine  10 Units Subcutaneous Daily  . ipratropium  0.5 mg Nebulization TID  . levalbuterol  1.25 mg Nebulization TID  . mouth rinse  15 mL Mouth Rinse BID  . methylPREDNISolone (SOLU-MEDROL) injection  60 mg Intravenous Q12H  . metoprolol succinate  100 mg Oral Daily  . rosuvastatin  40 mg Oral QPM   Continuous Infusions: . amiodarone 30 mg/hr (03/03/17 0059)  . piperacillin-tazobactam (ZOSYN)  IV 3.375 g (03/03/17 0659)  . vancomycin Stopped (03/03/17 9379)     LOS: 2 days    Time spent: Total of 25 minutes spent with pt, greater than 50% of which was spent in discussion of  treatment, counseling and coordination of care   Chipper Oman, MD Pager: Text Page via [email or web address]  [phone]  If 7PM-7AM, please contact night-coverage [email or web address] Password [email or web address] 03/03/2017, 9:01 AM

## 2017-03-03 NOTE — Progress Notes (Signed)
Inpatient Diabetes Program Recommendations  AACE/ADA: New Consensus Statement on Inpatient Glycemic Control (2015)  Target Ranges:  Prepandial:   less than 140 mg/dL      Peak postprandial:   less than 180 mg/dL (1-2 hours)      Critically ill patients:  140 - 180 mg/dL   Results for ONA, RATHERT (MRN [id number]) as of 03/03/2017 09:52  Ref. Range 03/02/2017 07:43 03/02/2017 11:45 03/02/2017 16:20 03/02/2017 21:07 03/03/2017 08:02  Glucose-Capillary Latest Ref Range: 65 - 99 mg/dL 265 (H) 274 (H) 205 (H) 303 (H) 278 (H)   Review of Glycemic Control  Diabetes history: DM 2 Outpatient Diabetes medications: Amaryl 4 mg Daily, Metformin 500 mg tid, Actos 15 mg Daily Current orders for Inpatient glycemic control: Lantus 10 units Daily, Novolog Moderate 0-15 units tid  Inpatient Diabetes Program Recommendations:    Patient still receiving IV Solumedrol 60 mg Q12 hours. Fasting glucose trends in the 200's. Consider Novolog Meal Coverage 4-6 units tid if patient consumes at least 50% of meals while on steroids.  Thanks,  Tama Headings RN, MSN, [facility] Inpatient Diabetes Coordinator Team Pager [phone] (8a-5p)

## 2017-03-03 NOTE — Telephone Encounter (Signed)
Left a message for patient advising him that radiation will be canceled today due to being in the hospital and that we will check with him tomorrow.

## 2017-03-03 NOTE — Progress Notes (Signed)
Progress Note  Patient Name: Jesse Avila Date of Encounter: 03/03/2017  Primary Cardiologist: Dr. Rockey Situ   Subjective   Feels SOB but 02 has decreased to 2 L.  No chest pain.  Discussed EF with pt and wife.  Inpatient Medications    Scheduled Meds: . Chlorhexidine Gluconate Cloth  6 each Topical Q0600  . diltiazem  120 mg Oral Daily  . fenofibrate  160 mg Oral Daily  . furosemide  40 mg Intravenous BID  . insulin aspart  0-15 Units Subcutaneous TID WC  . insulin glargine  10 Units Subcutaneous Daily  . ipratropium  0.5 mg Nebulization TID  . levalbuterol  1.25 mg Nebulization TID  . mouth rinse  15 mL Mouth Rinse BID  . methylPREDNISolone (SOLU-MEDROL) injection  60 mg Intravenous Q12H  . metoprolol succinate  100 mg Oral Daily  . rosuvastatin  40 mg Oral QPM   Continuous Infusions: . amiodarone 30 mg/hr (03/03/17 0059)  . piperacillin-tazobactam (ZOSYN)  IV 3.375 g (03/03/17 0659)  . vancomycin Stopped (03/03/17 7342)   PRN Meds: famotidine, guaiFENesin, ondansetron, technetium TC 34M diethylenetriame-pentaacetic acid, zolpidem   Vital Signs    Vitals:   03/02/17 2316 03/03/17 0000 03/03/17 0400 03/03/17 0700  BP:  112/73 109/68 134/78  Pulse:  (!) 104 98 98  Resp:  (!) 27 (!) 31   Temp: (!) 97.1 F (36.2 C)  (!) 97.5 F (36.4 C)   TempSrc: Axillary  Oral   SpO2:  97% 94% 98%  Weight:      Height:        Intake/Output Summary (Last 24 hours) at 03/03/17 0805 Last data filed at 03/03/17 0700  Gross per 24 hour  Intake           2064.1 ml  Output              450 ml  Net           1614.1 ml   Filed Weights   03/01/17 0113  Weight: 220 lb (99.8 kg)    Telemetry    A fib with rates 100-110 on average occ to 120 - Personally Reviewed  ECG    No new - Personally Reviewed  Physical Exam   GEN: No acute distress.  Was sleeping Neck: No JVD Cardiac: irreg irreg, no murmurs, rubs, or gallops.  Respiratory: Clear to auscultation bilaterally. From  ant position. No wheezes GI: Soft, nontender, non-distended  MS: 1-2+ LE edema; No deformity. Neuro:  Nonfocal  Psych: Normal affect to flat  Labs    Chemistry Recent Labs Lab 03/01/17 0117 03/01/17 1451 03/02/17 0500  NA 136 135 134*  K 3.6 3.2* 3.3*  CL 104 102 100*  CO2 18* 22 20*  GLUCOSE 205* 301* 289*  BUN 27* 30* 39*  CREATININE 1.36* 1.28* 1.62*  CALCIUM 9.0 8.9 8.8*  PROT  --   --  6.6  ALBUMIN  --   --  3.4*  AST  --   --  1,448*  ALT  --   --  1,380*  ALKPHOS  --   --  76  BILITOT  --   --  1.2  GFRNONAA 54* 58* 44*  GFRAA >60 >60 51*  ANIONGAP 14 11 14      Hematology Recent Labs Lab 03/01/17 0117 03/02/17 0500  WBC 8.8 11.6*  RBC 2.70* 2.72*  HGB 8.8* 8.8*  HCT 27.3* 27.4*  MCV 101.1* 100.7*  MCH 32.6 32.4  MCHC  32.2 32.1  RDW 20.8* 20.7*  PLT 135* 111*    Cardiac Enzymes Recent Labs Lab 03/01/17 0134 03/01/17 0539 03/01/17 1245 03/01/17 2047  TROPONINI 0.14* 0.23* 0.40* 0.36*   No results for input(s): TROPIPOC in the last 168 hours.   BNP Recent Labs Lab 03/01/17 0134  BNP 503.1*     DDimer  Recent Labs Lab 03/01/17 1245  DDIMER 4.06*     Radiology    Nm Pulmonary Perf And Vent  Result Date: 03/01/2017 CLINICAL DATA:  Shortness of breath with exertion for 4 weeks. History of left-sided cancer status post chemotherapy. Patient receiving radiation. EXAM: NUCLEAR MEDICINE VENTILATION - PERFUSION LUNG SCAN TECHNIQUE: Ventilation images were obtained in multiple projections using inhaled aerosol Tc-3m DTPA. Perfusion images were obtained in multiple projections after intravenous injection of Tc-45m MAA. RADIOPHARMACEUTICALS:  33.0 mCi Technetium-38m DTPA aerosol inhalation and 4.2 mCi Technetium-86m MAA IV COMPARISON:  None. FINDINGS: Ventilation: Multiple small ventilation defects in the right lung. Multiple large ventilation defects in the left lung, correlating with recent CT and plain film findings. Perfusion: No significant  perfusion abnormalities in the right lung. Large perfusion defects on the left match the ventilation abnormalities. IMPRESSION: Multiple matched defects in the left lung correlate with x-ray findings. As a result, this is an intermediate probability V/Q scan. Electronically Signed   By: Dorise Bullion III M.D   On: 03/01/2017 20:20   US Abdomen Limited Ruq  Result Date: 03/02/2017 CLINICAL DATA:  Transaminitis EXAM: ULTRASOUND ABDOMEN LIMITED RIGHT UPPER QUADRANT COMPARISON:  03/01/2017 FINDINGS: Gallbladder: No gallstones or wall thickening visualized. No sonographic Murphy sign noted by sonographer. Common bile duct: Diameter: 2.4 mm Liver: No focal lesion identified. Within normal limits in parenchymal echogenicity. IMPRESSION: 1. No acute findings identified. Electronically Signed   By: Kerby Moors M.D.   On: 03/02/2017 16:29    Cardiac Studies   03/02/17  ECHO Study Conclusions  - Left ventricle: The cavity size was moderately dilated. Wall   thickness was increased in a pattern of mild LVH. Systolic   function was severely reduced. The estimated ejection fraction   was in the range of 20% to 25%. Diffuse hypokinesis. The study is   not technically sufficient to allow evaluation of LV diastolic   function. - Mitral valve: There was mild regurgitation. - Left atrium: The atrium was moderately dilated. - Right ventricle: The cavity size was mildly dilated. Wall   thickness was normal. - Right atrium: The atrium was mildly dilated. - Pericardium, extracardiac: A trivial pericardial effusion was   identified along the right ventricular free wall. There was no   evidence of hemodynamic compromise. There was a left pleural   effusion.  Impressions:  - When compared to prior echo, EF is markedly reduced.  10/22/16 Echo Study Conclusions  - Left ventricle: The cavity size was normal. Wall thickness was   normal. The estimated ejection fraction was 55%. Basal inferior   and  basal inferoseptal severe hypokinesis. Doppler parameters are   consistent with abnormal left ventricular relaxation (grade 1   diastolic dysfunction). - Aortic valve: There was no stenosis. - Aorta: Borderline dilated aortic root. Aortic root dimension: 36   mm (ED). - Mitral valve: There was no significant regurgitation. - Left atrium: The atrium was mildly dilated. - Right ventricle: The cavity size was normal. Systolic function   was normal. - Pulmonary arteries: No complete TR doppler jet so unable to   estimate PA systolic pressure. - Inferior vena  cava: The vessel was normal in size. The   respirophasic diameter changes were in the normal range (>= 50%),   consistent with normal central venous pressure.  Impressions:  - Normal LV size with EF 50%. Basal inferior and inferoseptal   severe hypokinesis. Normal RV size and systolic function. No   significant valvular abnormalities.   Patient Profile     66 y.o. male with lung cancer who presents with postobstructive pneumonia and rapid atrial fibrillation. Now reduced EF.   Assessment & Plan    Atrial fib.  Pt had been on Eliquis, per Dr. Acie Fredrickson "If we do a cardioversion, he'll need to remain on anticoagulation for at least 3-4 weeks following the cardioversion.   I'm not sure whether or not he will need any surgical procedures for his metastatic lung cancer but doing a cardioversion which would require continued anticoagulation might not be advisable at this point"  Continue amiodarone, dilt and metoprolol. Though would like to have off dilt with decreased EF.  CHADSVASC score of 5- resume anticoagulation when able --HR 100-120 ? DCCV in future   Cardiomyopathy with EF 20-25% decreased from 50% in March 2018.   ? related to a fib.  Would prefer to stop dilt as HR allows. --nuc 2015 with EF 39% but EF per Echo then 55-60%, ?inf/septal defect then.  Possible ischemic work up once infection resolved. --hx of coronary  calcification on CTA of chest in Feb 2018 --known PAD  Acute systolic and diastolic HF  Now + 8546 since admit.  --on lasix 40 IV BID  Large Lt pleural effusion - for thoracentesis today  Small cell lung carcinoma per IM/Onc  Post obstructive PNA on ABX. Per CCM  Elevated LFTs AST 1662, ALT 1611   Signed, Cecilie Kicks, NP  03/03/2017, 8:05 AM     Attending Note:   The patient was seen and examined.  Agree with assessment and plan as noted above.  Changes made to the above note as needed.  Patient seen and independently examined with Cecilie Kicks, NP .   We discussed all aspects of the encounter. I agree with the assessment and plan as stated above.  1.  Atrial fib:   With RVR EF is now reduced. May be due to rate related cardiomyopathy. Will arrange for DC cardioversion on Friday  ( possible repeat thoracentesis tomorrow,  Will restart eliquis tomorrow night. He will received 4 doses of Eliquis before cardioversion on Friday   2.   Acute systolic CHF On metoprolol  BP has been borderline - will start ARB when BP improves. Will need to watch creatinine closely    I have spent a total of 40 minutes with patient reviewing hospital  notes , telemetry, EKGs, labs and examining patient as well as establishing an assessment and plan that was discussed with the patient. > 50% of time was spent in direct patient care.    Thayer Headings, Brooke Bonito., MD, [facility] 03/03/2017, 2:06 PM [address]  [place] Pager [phone]

## 2017-03-03 NOTE — Progress Notes (Signed)
Pt HS CBG 412. Notifed K. Schorr w/ [facility], received 1 time order of 8 units regular insulin to give with 10 units scheduled Lantus. Educated pt on need to adhere to carb modified diet, especially since currently on steroid medications. Verbally acknowledged and reported understanding. Will continue to monitor.

## 2017-03-03 NOTE — Procedures (Signed)
Thoracentesis Procedure Note  Pre-operative Diagnosis:  Pleural Effusion   Post-operative Diagnosis: same  Indications: Diagnostic evaluation of pleural fluid and therapeutic relief of dyspnea  Procedure Details  Consent: Informed consent was obtained. Risks of the procedure were discussed including: infection, bleeding, pain, pneumothorax.  Under sterile conditions the patient was positioned. Betadine solution and sterile drapes were utilized.  1% plain lidocaine was used to anesthetize the 8th rib space. Fluid was obtained without any difficulties and minimal blood loss.  A dressing was applied to the wound and wound care instructions were provided.   Findings 1500 ml of clear pleural fluid was obtained. A sample was sent for cell counts, cytology as well as for infection analysis.  Complications:  None; patient tolerated the procedure well.          Condition: stable  Plan A follow up chest x-ray was ordered.   Noe Gens, NP-C [facility] Pgr: [phone] or if no answer [phone] 03/03/2017, 10:25 AM  Supervised, agree  Roselie Awkward, MD [facility] Pager: [phone] Cell: [phone] After 3pm or if no response, call [phone]

## 2017-03-03 NOTE — Progress Notes (Addendum)
Dr. Acie Fredrickson requests that Jesse Avila be put on the schedule for TEE/DCCV tentatively on Friday 03/06/17. This has been scheduled for 1pm with Dr. Aundra Dubin.  Patient is medically complex. If procedure is still appropriate to proceed, will need orders written on Thursday 03/05/17. Will tentatively make NPO after midnight prior to procedure.  Floor nurse will need to call [facility] AM of procedure to have him at [facility] by 11:30am. Have written Care Order with this instruction.  [name] PA-C

## 2017-03-04 ENCOUNTER — Ambulatory Visit: Payer: BLUE CROSS/BLUE SHIELD

## 2017-03-04 DIAGNOSIS — I509 Heart failure, unspecified: Secondary | ICD-10-CM

## 2017-03-04 DIAGNOSIS — C3492 Malignant neoplasm of unspecified part of left bronchus or lung: Secondary | ICD-10-CM

## 2017-03-04 LAB — CBC WITH DIFFERENTIAL/PLATELET
BASOS PCT: 0 %
Basophils Absolute: 0 10*3/uL (ref 0.0–0.1)
Eosinophils Absolute: 0 10*3/uL (ref 0.0–0.7)
Eosinophils Relative: 0 %
HEMATOCRIT: 28.2 % — AB (ref 39.0–52.0)
HEMOGLOBIN: 9.3 g/dL — AB (ref 13.0–17.0)
LYMPHS PCT: 2 %
Lymphs Abs: 0.2 10*3/uL — ABNORMAL LOW (ref 0.7–4.0)
MCH: 32.2 pg (ref 26.0–34.0)
MCHC: 33 g/dL (ref 30.0–36.0)
MCV: 97.6 fL (ref 78.0–100.0)
MONOS PCT: 4 %
Monocytes Absolute: 0.4 10*3/uL (ref 0.1–1.0)
NEUTROS ABS: 9.2 10*3/uL — AB (ref 1.7–7.7)
NEUTROS PCT: 94 %
Platelets: 92 10*3/uL — ABNORMAL LOW (ref 150–400)
RBC: 2.89 MIL/uL — ABNORMAL LOW (ref 4.22–5.81)
RDW: 20.2 % — ABNORMAL HIGH (ref 11.5–15.5)
WBC: 9.8 10*3/uL (ref 4.0–10.5)

## 2017-03-04 LAB — BASIC METABOLIC PANEL
ANION GAP: 15 (ref 5–15)
BUN: 43 mg/dL — AB (ref 6–20)
CHLORIDE: 93 mmol/L — AB (ref 101–111)
CO2: 21 mmol/L — ABNORMAL LOW (ref 22–32)
Calcium: 8.4 mg/dL — ABNORMAL LOW (ref 8.9–10.3)
Creatinine, Ser: 1.59 mg/dL — ABNORMAL HIGH (ref 0.61–1.24)
GFR calc Af Amer: 52 mL/min — ABNORMAL LOW (ref >60)
GFR, EST NON AFRICAN AMERICAN: 45 mL/min — AB (ref >60)
Glucose, Bld: 666 mg/dL (ref 65–99)
POTASSIUM: 3.1 mmol/L — AB (ref 3.5–5.1)
SODIUM: 129 mmol/L — AB (ref 135–145)

## 2017-03-04 LAB — COMPREHENSIVE METABOLIC PANEL
ALBUMIN: 3 g/dL — AB (ref 3.5–5.0)
ALT: 1135 U/L — ABNORMAL HIGH (ref 17–63)
ANION GAP: 16 — AB (ref 5–15)
AST: 519 U/L — ABNORMAL HIGH (ref 15–41)
Alkaline Phosphatase: 89 U/L (ref 38–126)
BUN: 45 mg/dL — ABNORMAL HIGH (ref 6–20)
CALCIUM: 8.9 mg/dL (ref 8.9–10.3)
CO2: 21 mmol/L — AB (ref 22–32)
Chloride: 97 mmol/L — ABNORMAL LOW (ref 101–111)
Creatinine, Ser: 1.52 mg/dL — ABNORMAL HIGH (ref 0.61–1.24)
GFR calc non Af Amer: 47 mL/min — ABNORMAL LOW (ref >60)
GFR, EST AFRICAN AMERICAN: 55 mL/min — AB (ref >60)
Glucose, Bld: 390 mg/dL — ABNORMAL HIGH (ref 65–99)
POTASSIUM: 2.8 mmol/L — AB (ref 3.5–5.1)
SODIUM: 134 mmol/L — AB (ref 135–145)
Total Bilirubin: 2.3 mg/dL — ABNORMAL HIGH (ref 0.3–1.2)
Total Protein: 5.8 g/dL — ABNORMAL LOW (ref 6.5–8.1)

## 2017-03-04 LAB — GLUCOSE, CAPILLARY
GLUCOSE-CAPILLARY: 518 mg/dL — AB (ref 65–99)
GLUCOSE-CAPILLARY: 434 mg/dL — AB (ref 65–99)
Glucose-Capillary: 370 mg/dL — ABNORMAL HIGH (ref 65–99)
Glucose-Capillary: 421 mg/dL — ABNORMAL HIGH (ref 65–99)
Glucose-Capillary: 508 mg/dL (ref 65–99)

## 2017-03-04 LAB — CULTURE, RESPIRATORY: GRAM STAIN: NONE SEEN

## 2017-03-04 LAB — HEPATITIS PANEL, ACUTE
HEP B C IGM: NEGATIVE
HEP B S AG: NEGATIVE
Hep A IgM: NEGATIVE

## 2017-03-04 LAB — CULTURE, RESPIRATORY W GRAM STAIN

## 2017-03-04 MED ORDER — POTASSIUM CHLORIDE CRYS ER 20 MEQ PO TBCR
40.0000 meq | EXTENDED_RELEASE_TABLET | Freq: Two times a day (BID) | ORAL | Status: DC
  Administered 2017-03-04 – 2017-03-10 (×11): 40 meq via ORAL
  Filled 2017-03-04 (×11): qty 2

## 2017-03-04 MED ORDER — INSULIN ASPART 100 UNIT/ML ~~LOC~~ SOLN
24.0000 [IU] | Freq: Once | SUBCUTANEOUS | Status: AC
  Administered 2017-03-04: 24 [IU] via SUBCUTANEOUS

## 2017-03-04 MED ORDER — APIXABAN 5 MG PO TABS
5.0000 mg | ORAL_TABLET | Freq: Two times a day (BID) | ORAL | Status: DC
  Administered 2017-03-04 – 2017-03-10 (×11): 5 mg via ORAL
  Filled 2017-03-04 (×11): qty 1

## 2017-03-04 MED ORDER — INSULIN ASPART 100 UNIT/ML ~~LOC~~ SOLN
20.0000 [IU] | Freq: Once | SUBCUTANEOUS | Status: AC
  Administered 2017-03-04: 20 [IU] via SUBCUTANEOUS

## 2017-03-04 MED ORDER — POTASSIUM CHLORIDE CRYS ER 20 MEQ PO TBCR
40.0000 meq | EXTENDED_RELEASE_TABLET | ORAL | Status: AC
  Administered 2017-03-04 (×2): 40 meq via ORAL
  Filled 2017-03-04 (×2): qty 2

## 2017-03-04 MED ORDER — INSULIN ASPART 100 UNIT/ML ~~LOC~~ SOLN
6.0000 [IU] | Freq: Three times a day (TID) | SUBCUTANEOUS | Status: DC
  Administered 2017-03-05 – 2017-03-08 (×9): 6 [IU] via SUBCUTANEOUS

## 2017-03-04 MED ORDER — INSULIN ASPART 100 UNIT/ML ~~LOC~~ SOLN
8.0000 [IU] | Freq: Once | SUBCUTANEOUS | Status: AC
  Administered 2017-03-04: 8 [IU] via SUBCUTANEOUS

## 2017-03-04 MED ORDER — INSULIN GLARGINE 100 UNIT/ML ~~LOC~~ SOLN
15.0000 [IU] | Freq: Every day | SUBCUTANEOUS | Status: DC
  Administered 2017-03-04: 15 [IU] via SUBCUTANEOUS
  Filled 2017-03-04: qty 0.15

## 2017-03-04 MED ORDER — INSULIN ASPART 100 UNIT/ML ~~LOC~~ SOLN
0.0000 [IU] | Freq: Three times a day (TID) | SUBCUTANEOUS | Status: DC
  Administered 2017-03-05: 20 [IU] via SUBCUTANEOUS
  Administered 2017-03-05: 11 [IU] via SUBCUTANEOUS
  Administered 2017-03-05: 20 [IU] via SUBCUTANEOUS
  Administered 2017-03-06 (×2): 7 [IU] via SUBCUTANEOUS
  Administered 2017-03-07: 4 [IU] via SUBCUTANEOUS
  Administered 2017-03-07 (×2): 7 [IU] via SUBCUTANEOUS
  Administered 2017-03-08 – 2017-03-10 (×4): 4 [IU] via SUBCUTANEOUS

## 2017-03-04 MED ORDER — POTASSIUM CHLORIDE CRYS ER 20 MEQ PO TBCR: 40.0000 meq | EXTENDED_RELEASE_TABLET | Freq: Two times a day (BID) | ORAL | Status: DC

## 2017-03-04 MED ORDER — INSULIN ASPART 100 UNIT/ML ~~LOC~~ SOLN
0.0000 [IU] | Freq: Every day | SUBCUTANEOUS | Status: DC
  Administered 2017-03-04: 5 [IU] via SUBCUTANEOUS
  Administered 2017-03-05: 4 [IU] via SUBCUTANEOUS

## 2017-03-04 MED ORDER — INSULIN ASPART 100 UNIT/ML ~~LOC~~ SOLN: 8.0000 [IU] | Freq: Three times a day (TID) | SUBCUTANEOUS | Status: DC

## 2017-03-04 NOTE — Progress Notes (Signed)
Progress Note  Patient Name: Jesse Avila Date of Encounter: 03/04/2017  Primary Cardiologist: Dr. Rockey Situ   Subjective   Up in chair, breathing better s/p thoracentesis yesterday  Inpatient Medications    Scheduled Meds: . Chlorhexidine Gluconate Cloth  6 each Topical Q0600  . furosemide  40 mg Intravenous BID  . insulin aspart  0-15 Units Subcutaneous TID WC  . insulin aspart  6 Units Subcutaneous TID WC  . insulin glargine  15 Units Subcutaneous Daily  . ipratropium  0.5 mg Nebulization TID  . levalbuterol  1.25 mg Nebulization TID  . methylPREDNISolone (SOLU-MEDROL) injection  60 mg Intravenous Q12H  . metoprolol succinate  100 mg Oral BID  . potassium chloride  40 mEq Oral Q2H  . spironolactone  25 mg Oral Daily   Continuous Infusions: . amiodarone 30 mg/hr (03/04/17 0106)  . piperacillin-tazobactam (ZOSYN)  IV 3.375 g (03/04/17 0648)   PRN Meds: famotidine, guaiFENesin, ondansetron, technetium TC 8M diethylenetriame-pentaacetic acid, zolpidem   Vital Signs    Vitals:   03/04/17 0800 03/04/17 0808 03/04/17 0900 03/04/17 1000  BP: 132/84 132/84 118/71 106/82  Pulse:  97    Resp: (!) 24  (!) 23 (!) 22  Temp:  (!) 97.4 F (36.3 C)    TempSrc:  Oral    SpO2:      Weight:      Height:        Intake/Output Summary (Last 24 hours) at 03/04/17 1013 Last data filed at 03/04/17 0746  Gross per 24 hour  Intake            940.6 ml  Output             1325 ml  Net           -384.4 ml   Filed Weights   03/01/17 0113  Weight: 220 lb (99.8 kg)    Telemetry    A fib with rates 100-110 on average occ to 120 - Personally Reviewed  ECG    No new - Personally Reviewed  Physical Exam   GEN: No acute distress.  Was sleeping Neck: No JVD Cardiac: irreg irreg, no murmurs, rubs, or gallops.  Respiratory: Clear to auscultation bilaterally. From ant position. No wheezes GI: Soft, nontender, non-distended  MS: 1-2+ LE edema; No deformity. Neuro:  Nonfocal    Psych: Normal affect to flat  Labs    Chemistry  Recent Labs Lab 03/02/17 0500 03/03/17 0711 03/03/17 1057 03/04/17 0540  NA 134* 133*  --  134*  K 3.3* 3.2*  --  2.8*  CL 100* 99*  --  97*  CO2 20* 21*  --  21*  GLUCOSE 289* 316*  --  390*  BUN 39* 52*  --  45*  CREATININE 1.62* 1.57*  --  1.52*  CALCIUM 8.8* 8.8*  --  8.9  PROT 6.6 6.3* 6.6 5.8*  ALBUMIN 3.4* 3.4*  --  3.0*  AST 1,448* 1,662*  --  519*  ALT 1,380* 1,611*  --  1,135*  ALKPHOS 76 83  --  89  BILITOT 1.2 2.0*  --  2.3*  GFRNONAA 44* 45*  --  47*  GFRAA 51* 52*  --  55*  ANIONGAP 14 13  --  16*     Hematology  Recent Labs Lab 03/02/17 0500 03/03/17 0711 03/04/17 0540  WBC 11.6* 10.6* 9.8  RBC 2.72* 2.70* 2.89*  HGB 8.8* 8.7* 9.3*  HCT 27.4* 26.6* 28.2*  MCV 100.7*  98.5 97.6  MCH 32.4 32.2 32.2  MCHC 32.1 32.7 33.0  RDW 20.7* 20.5* 20.2*  PLT 111* 85* 92*    Cardiac Enzymes  Recent Labs Lab 03/01/17 0134 03/01/17 0539 03/01/17 1245 03/01/17 2047  TROPONINI 0.14* 0.23* 0.40* 0.36*   No results for input(s): TROPIPOC in the last 168 hours.   BNP  Recent Labs Lab 03/01/17 0134  BNP 503.1*     DDimer   Recent Labs Lab 03/01/17 1245  DDIMER 4.06*     Radiology    Dg Chest Port 1 View  Result Date: 03/03/2017 CLINICAL DATA:  Left thoracentesis.  Left pleural effusion. EXAM: PORTABLE CHEST 1 VIEW COMPARISON:  03/01/2017 FINDINGS: No pneumothorax after left thoracentesis. Decreased density at the left lung base with improved aeration at the left lung base. Progressive hazy infiltrate in the left upper lung zone. Right lung is now clear. Power port in good position. Overall heart size and pulmonary vascularity are within normal limits considering the AP portable technique. IMPRESSION: 1. No pneumothorax after left thoracentesis. 2. Improved aeration at the left lung base. 3. Progressive infiltrate in the left upper lobe. Electronically Signed   By: Lorriane Shire M.D.   On:  03/03/2017 10:59   US Abdomen Limited Ruq  Result Date: 03/02/2017 CLINICAL DATA:  Transaminitis EXAM: ULTRASOUND ABDOMEN LIMITED RIGHT UPPER QUADRANT COMPARISON:  03/01/2017 FINDINGS: Gallbladder: No gallstones or wall thickening visualized. No sonographic Murphy sign noted by sonographer. Common bile duct: Diameter: 2.4 mm Liver: No focal lesion identified. Within normal limits in parenchymal echogenicity. IMPRESSION: 1. No acute findings identified. Electronically Signed   By: Kerby Moors M.D.   On: 03/02/2017 16:29    Cardiac Studies   03/02/17  ECHO Study Conclusions  - Left ventricle: The cavity size was moderately dilated. Wall   thickness was increased in a pattern of mild LVH. Systolic   function was severely reduced. The estimated ejection fraction   was in the range of 20% to 25%. Diffuse hypokinesis. The study is   not technically sufficient to allow evaluation of LV diastolic   function. - Mitral valve: There was mild regurgitation. - Left atrium: The atrium was moderately dilated. - Right ventricle: The cavity size was mildly dilated. Wall   thickness was normal. - Right atrium: The atrium was mildly dilated. - Pericardium, extracardiac: A trivial pericardial effusion was   identified along the right ventricular free wall. There was no   evidence of hemodynamic compromise. There was a left pleural   effusion.  Impressions:  - When compared to prior echo, EF is markedly reduced.  10/22/16 Echo Study Conclusions  - Left ventricle: The cavity size was normal. Wall thickness was   normal. The estimated ejection fraction was 55%. Basal inferior   and basal inferoseptal severe hypokinesis. Doppler parameters are   consistent with abnormal left ventricular relaxation (grade 1   diastolic dysfunction). - Aortic valve: There was no stenosis. - Aorta: Borderline dilated aortic root. Aortic root dimension: 36   mm (ED). - Mitral valve: There was no significant  regurgitation. - Left atrium: The atrium was mildly dilated. - Right ventricle: The cavity size was normal. Systolic function   was normal. - Pulmonary arteries: No complete TR doppler jet so unable to   estimate PA systolic pressure. - Inferior vena cava: The vessel was normal in size. The   respirophasic diameter changes were in the normal range (>= 50%),   consistent with normal central venous pressure.  Impressions:  -  Normal LV size with EF 50%. Basal inferior and inferoseptal   severe hypokinesis. Normal RV size and systolic function. No   significant valvular abnormalities.   Patient Profile     66 y.o. male with lung cancer with mets, treated with chemo and radiation who presents with pneumonia, rapid atrial fibrillation, and newly reduced EF.   Assessment & Plan    Atrial fib.  Pt had been on Eliquis, per Dr. Acie Fredrickson "If we do a cardioversion, he'll need to remain on anticoagulation for at least 3-4 weeks following the cardioversion.   I'm not sure whether or not he will need any surgical procedures for his metastatic lung cancer but doing a cardioversion which would require continued anticoagulation might not be advisable at this point"  Continue amiodarone, dilt and metoprolol. Though would like to have off dilt with decreased EF.  CHADSVASC score of 5- on Eliquis and IV Amiodarone now  Cardiomyopathy with EF 20-25% decreased from 50% in March 2018. presumably related to a fib.  Would prefer to stop dilt as HR allows. --hx of coronary calcification on CTA of chest in Feb 2018 --known PAD  Acute systolic and diastolic HF  --on lasix 40 IV BID, I/O +784.   Large Lt pleural effusion - for thoracentesis done yesterday  Small cell lung carcinoma with mets-per IM/Onc   PNA on ABX. Per CCM  Elevated LFTs- on admission-1448/1380, down to 519/1135 today- ? Secondary to CHF.  they were normal 02/10/17   Abnormal TSH- low TSH March 2018  Plan: MD to see. I/O not negative  but he did have a thoracentesis. Continue IV Lasix. His K+ is 2.8- replacement ordered. Plan is for TEE CV Friday. Will need to follow LFTs and TSH closely if the plan is for Amiodarone long term post DCCV.  TEE/DCCV scheduled for Friday 03/06/17 at 1pm with Dr. Aundra Dubin. He will need orders written on Thursday 03/05/17. Will make NPO after midnight prior to procedure.  Floor nurse will need to call [facility] AM of procedure to have him at [facility] by 11:30am. Have written Care Order with this instruction.   Signed, Kerin Ransom, PA-C  03/04/2017, 10:13 AM      Attending Note:   The patient was seen and examined.  Agree with assessment and plan as noted above.  Changes made to the above note as needed.  Patient seen and independently examined with Kerin Ransom, PA .   We discussed all aspects of the encounter. I agree with the assessment and plan as stated above.  1.  Atrial fib:   Plan is for TEE cardioversion on Friday  2.   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     I have spent a total of 40 minutes with patient reviewing hospital  notes , telemetry, EKGs, labs and examining patient as well as establishing an assessment and plan that was discussed with the patient. > 50% of time was spent in direct patient care.    Thayer Headings, Brooke Bonito., MD, [facility] 03/04/2017, 10:40 AM [address],  [place] Pager [phone]

## 2017-03-04 NOTE — Progress Notes (Signed)
Pt remains off BIPAP at this time.

## 2017-03-04 NOTE — Progress Notes (Signed)
Pharmacy Antibiotic Note  Jesse Avila is a 66 y.o. male admitted on 03/01/2017 with post-obstructive pneumonia.  Pharmacy has been consulted for Vancomycin & Zosyn dosing.  Today, 03/04/2017  Day #4 Vancomycin & Zosyn SCr increased since starting abx 1.28 > 1.62 > 1.57 > 1.52 CrCl decreased 50 ml/min/1.58m2 (normalized) WBC 9.8, improving Afebrile Blood and pleural cultures pending  Plan:  Continue vancomycin to 750mg  IV q12h  Check steady state tonight at 17:30, goal 15-20 mcg/ml  Continue Zosyn 3.375gm IV Q8h to be infused over 4hrs  Daily SCreatinine  Monitor renal function and cx data   Height: 5\' 11"  (180.3 cm) Weight: 220 lb (99.8 kg) IBW/kg (Calculated) : 75.3  Temp (24hrs), Avg:97.6 F (36.4 C), Min:97.4 F (36.3 C), Max:97.8 F (36.6 C)   Recent Labs Lab 03/01/17 0117 03/01/17 0452 03/01/17 0757 03/01/17 1451 03/01/17 1516 03/02/17 0500 03/02/17 1105 03/02/17 1617 03/03/17 0711 03/04/17 0540  WBC 8.8  --   --   --   --  11.6*  --   --  10.6* 9.8  CREATININE 1.36*  --   --  1.28*  --  1.62*  --   --  1.57* 1.52*  LATICACIDVEN  --  3.7* 2.2*  --  2.7*  --  3.4* 3.9*  --   --     Estimated Creatinine Clearance: 59.9 mL/min (A) (by C-G formula based on SCr of 1.52 mg/dL (H)).    Allergies  Allergen Reactions  . Cialis [Tadalafil] Other (See Comments)    Reaction:  Headache    Antimicrobials this admission: 7/29 Vancomycin >>  7/29 Cefepime >>7/29 7/29 Zosyn>>  Dose adjustments this admission: 7/30 decrease vanc from 1g to 750mg  q12h for rising SCr  Microbiology results: 7/29 BCx: obtained after abx given: rare GNR, GPR, cx pending 7/29 MRSA PCR: neg 7/29 HIV Ab: NR 7/29 Strep Ab: neg 7/30 Sputum: rare GNR, GPR, cx reincubated for better growth 7/31 Pleural fluid: no organisms seen  Thank you for allowing pharmacy to be a part of this patient's care.  Peggyann Juba, PharmD, BCPS Pager: [phone] 03/04/2017, 7:45 AM

## 2017-03-04 NOTE — Progress Notes (Addendum)
[facility] Progress Note  Patient: Jesse Avila DZH:[id number]   PCP: [name], MD DOB: 1951-05-17   DOA: 03/01/2017   DOS: 03/04/2017   Date of Service: the patient was seen and examined on 03/04/2017  Subjective: Feeling better, continues to have shortness of breath as well as cough. Bilateral swelling is also present. No nausea no vomiting. Tolerating oral diet. One episode of loose watery BM  Brief hospital course: Pt. with PMH of small cell lung cancer, S/P chemotherapy and radiation, COPD, CHF, HTN, A. fib; admitted on 03/01/2017, presented with complaint of shortness of breath, was found to have postobstructive pneumonia, pleural effusion, acute on chronic systolic CHF as well as A. fib with RVR. Cardiology and CCM on board. Currently further plan is continue current management of rate control as well as diuresis.  Assessment and Plan: 1. Acute respiratory failure with hypoxia Acute on chronic systolic CHF. A. fib with RVR. Postobstructive pneumonia. Large pleural effusion bilaterally. Presented with shortness of breath, currently requiring oxygen. Not requiring BiPAP. He initially started on IV Vanc and cefepime, currently on IV Zosyn since 03/01/2017. VQ scan intermediate possibility of PE. RD on other questions. Continue prednisone, incentive spirometry. Ejection fraction 20-25%, differential include tachycardia induced versus ischemic versus septic. Cardiology consulted, started on diuresis monitoring and out and daily weight. CCM consulted for post obstructive pneumonia as well as last pleural effusion, S/P thoracentesis, pleural fluid analysis shows transudative effusion based on lites criteria. Continue diuresis for now. On IV Lasix as well as on Aldactone. Metoprolol for rate control.  2. A. fib with RVR. Cardiology consulted. Initially started on Cardizem, due to echocardiogram showing severe systolic dysfunction Cardizem was discontinued and patient was  transitioned to amiodarone. LFTs normal at baseline, worsened after starting amiodarone. Monitor at present. CHA2DS2-VASc score 5, on Apixaban. Was on hold for thoracentesis, we will resume this afternoon. Plan for cardioversion on Friday.  3. Elevated LFTs. AST<ALT, in 1000s. Ultrasound normal. Recent LFTs in outpatient setting were normal as well. Currently trending down. Likely idiosyncratic reaction to medication(amiodarone, cefepime, fibrates, statin) versus congestive hepatopathy from CHF. Would ideally prefer amiodarone to be discontinued, will monitor LFT and discuss with cardiology tomorrow. Avoid hepatotoxic medication and monitor at present.  4. Acute kidney injury. Renal function worsened after admission for initiating the diuresis. Currently stable. Monitor in and out and daily weight. Avoid nephrotoxic medication. Vancomycin discontinued.  5. Elevated troponin. Likely demand ischemia, non-STEMI type II. No chest pain or EKG changes. Cardiology currently following.  6. Left lung small cell cancer, limited versus early extensive. Treated with 6 cycles of chemotherapy as well as radiation. Initial plan was for prophylactic whole brain radiation, per pulmonary currently recommending to hold off on radiation while the patient is on amiodarone.  7. Uncontrolled type 2 diabetes mellitus. Hypoglycemia. Hemoglobin A1c 8.5 in March. We'll recheck tomorrow. Patient on metformin and Actos at home, currently both are reported. Would recommend to discontinue Actos on discharge. Placing the patient on sliding scale insulin, resistant due to hypercalcemia as well as increasing the Lantus from 10 units to 15 units. Also using scheduled before meals coverage. Usually would transition him back to moderate sliding scale starting tomorrow.  Diet: Cardiac diet carb modified DVT Prophylaxis: mechanical compression device, on therapeutic anticoagulation.  Advance goals of care  discussion: full code  Family Communication: no family was present at bedside, at the time of interview.  Disposition:  Discharge to be determined.  Consultants: cardiology, CCM Procedures: Echocardiogram, thoracentesis  Antibiotics: Anti-infectives    Start     Dose/Rate Route Frequency Ordered Stop   03/02/17 1800  vancomycin (VANCOCIN) IVPB 750 mg/150 ml premix  Status:  Discontinued     750 mg 150 mL/hr over 60 Minutes Intravenous Every 12 hours 03/02/17 1050 03/04/17 0956   03/01/17 2200  piperacillin-tazobactam (ZOSYN) IVPB 3.375 g     3.375 g 12.5 mL/hr over 240 Minutes Intravenous Every 8 hours 03/01/17 1457     03/01/17 1800  vancomycin (VANCOCIN) IVPB 1000 mg/200 mL premix  Status:  Discontinued     1,000 mg 200 mL/hr over 60 Minutes Intravenous Every 12 hours 03/01/17 0723 03/02/17 1050   03/01/17 1200  ceFEPIme (MAXIPIME) 2 g in dextrose 5 % 50 mL IVPB  Status:  Discontinued     2 g 100 mL/hr over 30 Minutes Intravenous Every 8 hours 03/01/17 0712 03/01/17 1448   03/01/17 0800  vancomycin (VANCOCIN) IVPB 1000 mg/200 mL premix     1,000 mg 200 mL/hr over 60 Minutes Intravenous  Once 03/01/17 0718 03/01/17 0915   03/01/17 0330  vancomycin (VANCOCIN) IVPB 1000 mg/200 mL premix     1,000 mg 200 mL/hr over 60 Minutes Intravenous  Once 03/01/17 0317 03/02/17 0708   03/01/17 0330  ceFEPIme (MAXIPIME) 2 g in dextrose 5 % 50 mL IVPB     2 g 100 mL/hr over 30 Minutes Intravenous  Once 03/01/17 0317 03/02/17 0708       Objective: Physical Exam: Vitals:   03/04/17 1000 03/04/17 1150 03/04/17 1153 03/04/17 1200  BP: 106/82 117/67  112/75  Pulse:  100 (!) 106   Resp: (!) 22 (!) 24 20 19   Temp:      TempSrc:      SpO2:  (!) 89% 100% 100%  Weight:      Height:        Intake/Output Summary (Last 24 hours) at 03/04/17 1314 Last data filed at 03/04/17 1022  Gross per 24 hour  Intake           1120.5 ml  Output             1000 ml  Net            120.5 ml   Filed  Weights   03/01/17 0113  Weight: 99.8 kg (220 lb)   General: Alert, Awake and Oriented to Time, Place and Person. Appear in moderate distress, affect appropriate Eyes: PERRL, Conjunctiva normal ENT: Oral Mucosa clear moist. Neck: difficult to assess JVD, no Abnormal Mass Or lumps Cardiovascular: S1 and S2 Present, no Murmur, Peripheral Pulses Present Respiratory: increasedrespiratory effort, Bilateral Air entry equal and Decreased, no use of accessory muscle, basal Crackles, no wheezes Abdomen: Bowel Sound present, Soft and mild tenderness, no hernia Skin: no redness, no Rash, no induration Extremities: bilateral Pedal edema, no calf tenderness Neurologic: Grossly no focal neuro deficit. Bilaterally Equal motor strength  Data Reviewed: CBC:  Recent Labs Lab 03/01/17 0117 03/02/17 0500 03/03/17 0711 03/04/17 0540  WBC 8.8 11.6* 10.6* 9.8  NEUTROABS 7.2 10.7* 10.0* 9.2*  HGB 8.8* 8.8* 8.7* 9.3*  HCT 27.3* 27.4* 26.6* 28.2*  MCV 101.1* 100.7* 98.5 97.6  PLT 135* 111* 85* 92*   Basic Metabolic Panel:  Recent Labs Lab 03/01/17 0117 03/01/17 1451 03/02/17 0500 03/03/17 0711 03/04/17 0540  NA 136 135 134* 133* 134*  K 3.6 3.2* 3.3* 3.2* 2.8*  CL 104 102 100* 99* 97*  CO2 18* 22 20* 21* 21*  GLUCOSE 205* 301* 289* 316* 390*  BUN 27* 30* 39* 52* 45*  CREATININE 1.36* 1.28* 1.62* 1.57* 1.52*  CALCIUM 9.0 8.9 8.8* 8.8* 8.9    Liver Function Tests:  Recent Labs Lab 03/02/17 0500 03/03/17 0711 03/03/17 1057 03/04/17 0540  AST 1,448* 1,662*  --  519*  ALT 1,380* 1,611*  --  1,135*  ALKPHOS 76 83  --  89  BILITOT 1.2 2.0*  --  2.3*  PROT 6.6 6.3* 6.6 5.8*  ALBUMIN 3.4* 3.4*  --  3.0*   No results for input(s): LIPASE, AMYLASE in the last 168 hours. No results for input(s): AMMONIA in the last 168 hours. Coagulation Profile: No results for input(s): INR, PROTIME in the last 168 hours. Cardiac Enzymes:  Recent Labs Lab 03/01/17 0134 03/01/17 0539  03/01/17 1245 03/01/17 2047  TROPONINI 0.14* 0.23* 0.40* 0.36*   BNP (last 3 results) No results for input(s): PROBNP in the last 8760 hours. CBG:  Recent Labs Lab 03/03/17 1747 03/03/17 2106 03/03/17 2322 03/04/17 0747 03/04/17 1249  GLUCAP 389* 412* 373* 370* 421*   Studies: No results found.  Scheduled Meds: . apixaban  5 mg Oral BID  . Chlorhexidine Gluconate Cloth  6 each Topical Q0600  . furosemide  40 mg Intravenous BID  . insulin aspart  0-20 Units Subcutaneous TID WC  . insulin aspart  0-5 Units Subcutaneous QHS  . insulin aspart  6 Units Subcutaneous TID WC  . insulin glargine  15 Units Subcutaneous Daily  . ipratropium  0.5 mg Nebulization TID  . levalbuterol  1.25 mg Nebulization TID  . methylPREDNISolone (SOLU-MEDROL) injection  60 mg Intravenous Q12H  . metoprolol succinate  100 mg Oral BID  . potassium chloride  40 mEq Oral BID  . spironolactone  25 mg Oral Daily   Continuous Infusions: . amiodarone 30 mg/hr (03/04/17 1258)  . piperacillin-tazobactam (ZOSYN)  IV 3.375 g (03/04/17 1304)   PRN Meds: famotidine, guaiFENesin, ondansetron, technetium TC 70M diethylenetriame-pentaacetic acid, zolpidem  Time spent: 35 minutes  Author: Berle Mull, MD [facility] Pager: [phone] 03/04/2017 1:14 PM  If 7PM-7AM, please contact night-coverage at [email or web address], password [facility]

## 2017-03-04 NOTE — Progress Notes (Addendum)
Name: Jesse Avila MRN: [id number] DOB: 1950/10/06    ADMISSION DATE:  03/01/2017 CONSULTATION DATE:  03/02/17  REFERRING MD :  Dr. Quincy Simmonds   CHIEF COMPLAINT:  Shortness of Breath    HISTORY OF PRESENT ILLNESS:  66 y/o M  admitted on 7/29 with a 4 week history of increasing shortness of breath and exertional dyspnea.  He also reported increased fatigue, cough with productive white sputum and bilateral lower extremity swelling.  He was recently admitted 7/2-7/5 for left PNA and AFwRVR (started on apixiban).    PMH of COPD & extensive stage small cell lung cancer diagnosed 09/2016 (followed by Dr. Sondra Come & Dr. Earlie Server - on carboplatin, etoposide, neulasta & radiotherapy) with distant metastases to mediastinum, pancreas and left iliac lesion  He was planned to start empiric whole brain radiation 7/31. His last radiation was approximately 8 weeks ago.   Initial evaluation found him to have AFwRVR.  He was started on a diltiazem gtt and given lasix.  Home apixaban was continued.  CXR was concerning for possible PNA and he was treated with vancomycin & zosyn.  IV steroids were given for bronchospasm.  He was admitted by [facility] for further evaluation of shortness of breath in the setting of possible PNA, COPD exacerbation & +/- volume overload.    CT of the chest / abd / pelvis with contrast was completed which showed decrease in size of soft tissue density in the left suprahilar region with extension into the AP window, worsened left-sided aeration with new ground-glass opacities, small left and trace right effusions, bilateral partially calcified pleural plaques, mild cirrhosis, vague left-sided iliac sclerosis & pulmonary artery enlargement.      SUBJECTIVE:  On amio gtt Dyspnea improved C/o mucoid sputum  VITAL SIGNS: Temp:  [97.4 F (36.3 C)-97.8 F (36.6 C)] 97.4 F (36.3 C) (08/01 0808) Pulse Rate:  [66-126] 97 (08/01 0808) Resp:  [17-32] 24 (08/01 0800) BP: (101-152)/(59-107) 132/84  (08/01 0808) SpO2:  [91 %-100 %] 94 % (08/01 0700)  PHYSICAL EXAMINATION: General: chronically ill appearing male in NAD, sitting up at bedside in chair HEENT: MM pink/moist, scleral icterus  PSY: calm/appropriate Neuro: AAOx4, speech clear  CV: s1s2 rrr, no m/r/g PULM: even/non-labored, lungs bilaterally bibasilar crackles R>L, diminished on L  WU:JWJX, non-tender, bsx4 active  Extremities: warm/dry, 2+ pitting edema BLE  Skin: no rashes or lesions   Recent Labs Lab 03/02/17 0500 03/03/17 0711 03/04/17 0540  NA 134* 133* 134*  K 3.3* 3.2* 2.8*  CL 100* 99* 97*  CO2 20* 21* 21*  BUN 39* 52* 45*  CREATININE 1.62* 1.57* 1.52*  GLUCOSE 289* 316* 390*     Recent Labs Lab 03/02/17 0500 03/03/17 0711 03/04/17 0540  HGB 8.8* 8.7* 9.3*  HCT 27.4* 26.6* 28.2*  WBC 11.6* 10.6* 9.8  PLT 111* 85* 92*    Dg Chest Port 1 View  Result Date: 03/03/2017 CLINICAL DATA:  Left thoracentesis.  Left pleural effusion. EXAM: PORTABLE CHEST 1 VIEW COMPARISON:  03/01/2017 FINDINGS: No pneumothorax after left thoracentesis. Decreased density at the left lung base with improved aeration at the left lung base. Progressive hazy infiltrate in the left upper lung zone. Right lung is now clear. Power port in good position. Overall heart size and pulmonary vascularity are within normal limits considering the AP portable technique. IMPRESSION: 1. No pneumothorax after left thoracentesis. 2. Improved aeration at the left lung base. 3. Progressive infiltrate in the left upper lobe. Electronically Signed   By:  Lorriane Shire M.D.   On: 03/03/2017 10:59   US Abdomen Limited Ruq  Result Date: 03/02/2017 CLINICAL DATA:  Transaminitis EXAM: ULTRASOUND ABDOMEN LIMITED RIGHT UPPER QUADRANT COMPARISON:  03/01/2017 FINDINGS: Gallbladder: No gallstones or wall thickening visualized. No sonographic Murphy sign noted by sonographer. Common bile duct: Diameter: 2.4 mm Liver: No focal lesion identified. Within normal  limits in parenchymal echogenicity. IMPRESSION: 1. No acute findings identified. Electronically Signed   By: Kerby Moors M.D.   On: 03/02/2017 16:29      SIGNIFICANT EVENTS  7/29  Admit with 4 wk hx SOB, concern for PNA, AFwRVR  CULTURES: BCx2 7/29 >> ng Sputum 7/30 >> ng  ANTIBIOTICS: Vancomycin 7/29 >> 8/1 Zosyn 7/29 >>   STUDIES:  ECHO 3/18 >> Normal LV size with EF 50%. Basal inferior and inferoseptal severe hypokinesis. Normal RV size and systolic function. No significant valvular abnormalities.  CT Chest / ABD/ Pelvis 7/6 >> decrease in size of soft tissue density in the left suprahilar region with extension into the AP window, worsened left-sided aeration with new ground-glass opacities, small left and trace right effusions, bilateral partially calcified pleural plaques, mild cirrhosis, vague left-sided iliac sclerosis & pulmonary artery enlargement.    V/Q 7/9 >> multiple matched defects in the left lung that correlate with XRAY findings, intermediate probability V/Q  Left thora 7/31 >> 1500 cc >>transudate  ASSESSMENT / PLAN:  Discussion:  66 y/o M with COPD and Stage IV small cell lung cancer admitted with 4 week hx of SOB.  He was recently admitted for PNA and AFwRVR.  Concern for possible post-obstructive PNA, volume overload in setting of AFwRVR and COPD exacerbation.        Left Lung Opacities - favor radiation pneumonitis, DD HCAP  Left Pleural Effusion , appears transudative  Acute Hypoxic Respiratory Failure - resolved  Extensive Stage Small Cell Lung Cancer - followed by Dr. Earlie Server / Dr. Dwana Curd, previously planned for whole brain radiation to start 7/31  Plan: FU pleural fluid cytology Dc vanc, ct zosyn x 5ds  AF-RVR - on amio gtt -hold off brain RT while on this   Elevated LFT's  RUQ [place] neg for gallstones Plan:   Updated pt & wife  Kara Mead MD. FCCP. [facility] Pager [phone] If no response call [phone]     03/04/2017, 9:42 AM

## 2017-03-05 ENCOUNTER — Telehealth: Payer: Self-pay | Admitting: Oncology

## 2017-03-05 ENCOUNTER — Ambulatory Visit: Payer: BLUE CROSS/BLUE SHIELD

## 2017-03-05 DIAGNOSIS — N179 Acute kidney failure, unspecified: Secondary | ICD-10-CM

## 2017-03-05 DIAGNOSIS — Z9889 Other specified postprocedural states: Secondary | ICD-10-CM

## 2017-03-05 DIAGNOSIS — R7401 Elevation of levels of liver transaminase levels: Secondary | ICD-10-CM

## 2017-03-05 DIAGNOSIS — I481 Persistent atrial fibrillation: Secondary | ICD-10-CM

## 2017-03-05 DIAGNOSIS — J189 Pneumonia, unspecified organism: Secondary | ICD-10-CM

## 2017-03-05 DIAGNOSIS — R740 Nonspecific elevation of levels of transaminase and lactic acid dehydrogenase [LDH]: Secondary | ICD-10-CM

## 2017-03-05 LAB — COMPREHENSIVE METABOLIC PANEL
ALT: 848 U/L — AB (ref 17–63)
AST: 195 U/L — ABNORMAL HIGH (ref 15–41)
Albumin: 3.2 g/dL — ABNORMAL LOW (ref 3.5–5.0)
Alkaline Phosphatase: 101 U/L (ref 38–126)
Anion gap: 11 (ref 5–15)
BUN: 45 mg/dL — ABNORMAL HIGH (ref 6–20)
CHLORIDE: 97 mmol/L — AB (ref 101–111)
CO2: 25 mmol/L (ref 22–32)
CREATININE: 1.6 mg/dL — AB (ref 0.61–1.24)
Calcium: 9.1 mg/dL (ref 8.9–10.3)
GFR, EST AFRICAN AMERICAN: 51 mL/min — AB (ref >60)
GFR, EST NON AFRICAN AMERICAN: 44 mL/min — AB (ref >60)
Glucose, Bld: 472 mg/dL — ABNORMAL HIGH (ref 65–99)
POTASSIUM: 3.8 mmol/L (ref 3.5–5.1)
Sodium: 133 mmol/L — ABNORMAL LOW (ref 135–145)
Total Bilirubin: 2.1 mg/dL — ABNORMAL HIGH (ref 0.3–1.2)
Total Protein: 6.2 g/dL — ABNORMAL LOW (ref 6.5–8.1)

## 2017-03-05 LAB — CBC WITH DIFFERENTIAL/PLATELET
BASOS ABS: 0 10*3/uL (ref 0.0–0.1)
Basophils Relative: 0 %
EOS ABS: 0 10*3/uL (ref 0.0–0.7)
Eosinophils Relative: 0 %
HCT: 29.4 % — ABNORMAL LOW (ref 39.0–52.0)
HEMOGLOBIN: 9.8 g/dL — AB (ref 13.0–17.0)
LYMPHS ABS: 0.3 10*3/uL — AB (ref 0.7–4.0)
Lymphocytes Relative: 3 %
MCH: 32.6 pg (ref 26.0–34.0)
MCHC: 33.3 g/dL (ref 30.0–36.0)
MCV: 97.7 fL (ref 78.0–100.0)
Monocytes Absolute: 0.4 10*3/uL (ref 0.1–1.0)
Monocytes Relative: 4 %
NEUTROS PCT: 93 %
Neutro Abs: 9.1 10*3/uL — ABNORMAL HIGH (ref 1.7–7.7)
Platelets: 94 10*3/uL — ABNORMAL LOW (ref 150–400)
RBC: 3.01 MIL/uL — AB (ref 4.22–5.81)
RDW: 20.6 % — ABNORMAL HIGH (ref 11.5–15.5)
WBC: 9.8 10*3/uL (ref 4.0–10.5)

## 2017-03-05 LAB — GLUCOSE, CAPILLARY
GLUCOSE-CAPILLARY: 432 mg/dL — AB (ref 65–99)
GLUCOSE-CAPILLARY: 375 mg/dL — AB (ref 65–99)
GLUCOSE-CAPILLARY: 396 mg/dL — AB (ref 65–99)
Glucose-Capillary: 320 mg/dL — ABNORMAL HIGH (ref 65–99)

## 2017-03-05 LAB — PROTIME-INR
INR: 3.4
PROTHROMBIN TIME: 35.2 s — AB (ref 11.4–15.2)

## 2017-03-05 LAB — LACTIC ACID, PLASMA: LACTIC ACID, VENOUS: 1.6 mmol/L (ref 0.5–1.9)

## 2017-03-05 LAB — MAGNESIUM: MAGNESIUM: 2.2 mg/dL (ref 1.7–2.4)

## 2017-03-05 MED ORDER — INSULIN GLARGINE 100 UNIT/ML ~~LOC~~ SOLN
20.0000 [IU] | Freq: Two times a day (BID) | SUBCUTANEOUS | Status: DC
  Administered 2017-03-05 – 2017-03-08 (×7): 20 [IU] via SUBCUTANEOUS
  Filled 2017-03-05 (×9): qty 0.2

## 2017-03-05 NOTE — Progress Notes (Signed)
Progress Note  Patient Name: Jesse Avila Date of Encounter: 03/05/2017  Primary Cardiologist: Dr. Rockey Situ   Subjective   Up in chair, breathing better s/p thoracentesis 07/31, not aware of afib.  Inpatient Medications    Scheduled Meds: . apixaban  5 mg Oral BID  . Chlorhexidine Gluconate Cloth  6 each Topical Q0600  . furosemide  40 mg Intravenous BID  . insulin aspart  0-20 Units Subcutaneous TID WC  . insulin aspart  0-5 Units Subcutaneous QHS  . insulin aspart  6 Units Subcutaneous TID WC  . insulin glargine  20 Units Subcutaneous BID  . ipratropium  0.5 mg Nebulization TID  . levalbuterol  1.25 mg Nebulization TID  . methylPREDNISolone (SOLU-MEDROL) injection  60 mg Intravenous Q12H  . metoprolol succinate  100 mg Oral BID  . potassium chloride  40 mEq Oral BID  . spironolactone  25 mg Oral Daily   Continuous Infusions: . amiodarone 30 mg/hr (03/05/17 0135)  . piperacillin-tazobactam (ZOSYN)  IV 3.375 g (03/05/17 0538)   PRN Meds: famotidine, guaiFENesin, ondansetron, technetium TC 55M diethylenetriame-pentaacetic acid, zolpidem   Vital Signs    Vitals:   03/04/17 2000 03/04/17 2100 03/05/17 0000 03/05/17 0400  BP: 122/84 115/64 119/75 122/77  Pulse: 74 (!) 106 71 (!) 106  Resp: (!) 29 (!) 30 (!) 28 (!) 24  Temp:   98.1 F (36.7 C) 98.1 F (36.7 C)  TempSrc:   Oral Oral  SpO2: 94% 91% 97% 96%  Weight:      Height:        Intake/Output Summary (Last 24 hours) at 03/05/17 0742 Last data filed at 03/05/17 0600  Gross per 24 hour  Intake            994.2 ml  Output              200 ml  Net            794.2 ml   Filed Weights   03/01/17 0113  Weight: 220 lb (99.8 kg)    Telemetry    A fib with rates 100-110 on average occ to 120 - Personally Reviewed  ECG    No new - Personally Reviewed  Physical Exam   GEN: No acute distress.  Neck: No JVD elevation Cardiac: irreg irreg, no murmurs, rubs, or gallops.  Respiratory: decreased BS bases  bilaterally with a few rales. No wheezes GI: Soft, nontender, non-distended  MS: 1-2+ LE edema; No deformity. Neuro:  Nonfocal  Psych: Normal affect to flat  Labs    Chemistry  Recent Labs Lab 03/03/17 0711 03/03/17 1057 03/04/17 0540 03/04/17 1751 03/05/17 0537  NA 133*  --  134* 129* 133*  K 3.2*  --  2.8* 3.1* 3.8  CL 99*  --  97* 93* 97*  CO2 21*  --  21* 21* 25  GLUCOSE 316*  --  390* 666* 472*  BUN 52*  --  45* 43* 45*  CREATININE 1.57*  --  1.52* 1.59* 1.60*  CALCIUM 8.8*  --  8.9 8.4* 9.1  PROT 6.3* 6.6 5.8*  --  6.2*  ALBUMIN 3.4*  --  3.0*  --  3.2*  AST 1,662*  --  519*  --  195*  ALT 1,611*  --  1,135*  --  848*  ALKPHOS 83  --  89  --  101  BILITOT 2.0*  --  2.3*  --  2.1*  GFRNONAA 45*  --  47*  45* 44*  GFRAA 52*  --  55* 52* 51*  ANIONGAP 13  --  16* 15 11     Hematology  Recent Labs Lab 03/03/17 0711 03/04/17 0540 03/05/17 0537  WBC 10.6* 9.8 9.8  RBC 2.70* 2.89* 3.01*  HGB 8.7* 9.3* 9.8*  HCT 26.6* 28.2* 29.4*  MCV 98.5 97.6 97.7  MCH 32.2 32.2 32.6  MCHC 32.7 33.0 33.3  RDW 20.5* 20.2* 20.6*  PLT 85* 92* 94*    Cardiac Enzymes  Recent Labs Lab 03/01/17 0134 03/01/17 0539 03/01/17 1245 03/01/17 2047  TROPONINI 0.14* 0.23* 0.40* 0.36*      BNP  Recent Labs Lab 03/01/17 0134  BNP 503.1*     DDimer   Recent Labs Lab 03/01/17 1245  DDIMER 4.06*    Lab Results  Component Value Date   TSH 0.234 (L) 10/21/2016    Radiology    Dg Chest Port 1 View  Result Date: 03/03/2017 CLINICAL DATA:  Left thoracentesis.  Left pleural effusion. EXAM: PORTABLE CHEST 1 VIEW COMPARISON:  03/01/2017 FINDINGS: No pneumothorax after left thoracentesis. Decreased density at the left lung base with improved aeration at the left lung base. Progressive hazy infiltrate in the left upper lung zone. Right lung is now clear. Power port in good position. Overall heart size and pulmonary vascularity are within normal limits considering the AP  portable technique. IMPRESSION: 1. No pneumothorax after left thoracentesis. 2. Improved aeration at the left lung base. 3. Progressive infiltrate in the left upper lobe. Electronically Signed   By: Lorriane Shire M.D.   On: 03/03/2017 10:59    Cardiac Studies   03/02/17  ECHO Study Conclusions - Left ventricle: The cavity size was moderately dilated. Wall   thickness was increased in a pattern of mild LVH. Systolic   function was severely reduced. The estimated ejection fraction   was in the range of 20% to 25%. Diffuse hypokinesis. The study is   not technically sufficient to allow evaluation of LV diastolic function. - Mitral valve: There was mild regurgitation. - Left atrium: The atrium was moderately dilated. - Right ventricle: The cavity size was mildly dilated. Wall   thickness was normal. - Right atrium: The atrium was mildly dilated. - Pericardium, extracardiac: A trivial pericardial effusion was   identified along the right ventricular free wall. There was no   evidence of hemodynamic compromise. There was a left pleural   effusion. Impressions: - When compared to prior echo, EF is markedly reduced.  10/22/16 Echo Study Conclusions - Left ventricle: The cavity size was normal. Wall thickness was   normal. The estimated ejection fraction was 55%. Basal inferior   and basal inferoseptal severe hypokinesis. Doppler parameters are   consistent with abnormal left ventricular relaxation (grade 1   diastolic dysfunction). - Aortic valve: There was no stenosis. - Aorta: Borderline dilated aortic root. Aortic root dimension: 36   mm (ED). - Mitral valve: There was no significant regurgitation. - Left atrium: The atrium was mildly dilated. - Right ventricle: The cavity size was normal. Systolic function   was normal. - Pulmonary arteries: No complete TR doppler jet so unable to   estimate PA systolic pressure. - Inferior vena cava: The vessel was normal in size. The    respirophasic diameter changes were in the normal range (>= 50%),   consistent with normal central venous pressure. Impressions: - Normal LV size with EF 50%. Basal inferior and inferoseptal   severe hypokinesis. Normal RV size and systolic  function. No   significant valvular abnormalities.   Patient Profile     66 y.o. male with lung cancer with mets, treated with chemo and radiation who presents with pneumonia, rapid atrial fibrillation, and newly reduced EF.   Assessment & Plan    Atrial fib.  Pt had been on Eliquis, per Dr. Acie Fredrickson "If we do a cardioversion, he'll need to remain on anticoagulation for at least 3-4 weeks following the cardioversion.   I'm not sure whether or not he will need any surgical procedures for his metastatic lung cancer but doing a cardioversion which would require continued anticoagulation might not be advisable at this point"  Continue amiodarone and metoprolol. Now off dilt with decreased EF.  CHADSVASC score of 5- on Eliquis and IV Amiodarone now  Cardiomyopathy with EF 20-25% decreased from 50% in March 2018. presumably related to a fib.   --hx of coronary calcification on CTA of chest in Feb 2018 --known PAD --ischemic eval at some point, would wait till he is otherwise more medically stable  Acute systolic and diastolic HF  --on lasix 40 IV BID, I/O +1.46 L. BUT urine output not recorded yesterday except 200 cc and no daily weights. Need both   Large Lt pleural effusion - thoracentesis 07/31  Small cell lung carcinoma with mets-per IM/Onc   PNA on ABX. Per CCM  Elevated LFTs- on admission-1448/1380, down to 195/848 today- ? Secondary to CHF.  they were normal 02/10/17   Abnormal TSH- low TSH March 2018  Plan: MD to see. I/O not negative but incomplete. Continue IV Lasix. His K+ is now 3.8- continue replacement. Plan is for TEE CV Friday. Will need to follow LFTs and TSH closely if the plan is for Amiodarone long term post DCCV.  TEE/DCCV  scheduled for Friday 03/06/17 at 1pm with Dr. Aundra Dubin. Orders written.   Floor nurse will need to call [facility] AM of procedure to have him at [facility] by 11:30am. Have written Care Order with this instruction.   Signed, Rosaria Ferries, PA-C  03/05/2017, 7:42 AM     Attending Note:   The patient was seen and examined.  Agree with assessment and plan as noted above.  Changes made to the above note as needed.  Patient seen and independently examined with Rosaria Ferries, PA .   We discussed all aspects of the encounter. I agree with the assessment and plan as stated above.  1.   Atrial fib:   Scheduled for TEE / Cardioversion tomorrow  He is looking much better   2.  Elevated LFTs:  Improving .  Likely related to chemo    I have spent a total of 40 minutes with patient reviewing hospital  notes , telemetry, EKGs, labs and examining patient as well as establishing an assessment and plan that was discussed with the patient. > 50% of time was spent in direct patient care.   Thayer Headings, Brooke Bonito., MD, [facility] 03/05/2017, 12:35 PM [address],  [place] Pager [phone]

## 2017-03-05 NOTE — Progress Notes (Signed)
[facility] PROGRESS NOTE    Progress Note  Jesse Avila  LFY:[id number] DOB: Oct 06, 1950 DOA: 03/01/2017 PCP: [name], MD     Brief Narrative:   Jesse Avila is an 66 y.o. male 66 year old male with past medical history small cell cancer, status post chemotherapy and radiation presents with complaint of shortness of breath was found to have postobstructive pneumonia with pleural effusion and acute on chronic systolic heart failure in A. fib with RVR.  Assessment/Plan:   Acute respiratory failure with hypoxia (HCC) Multifactorial likely due acute systolic heart failure with EF 20% and postobstructive pneumonia with large pleural effusion bilaterally: Started empirically on IV steroids IV vancomycin and cefepime, change to IV Zosyn for 5 days.  It seems like his on some relief after thoracentesis Cardiology was consulted and started diuresing, Yesterday she was positive about 700 mL, weight on admission was 98.8 kg. PCCM was consulted recommended thoracocentesis which was performed analysis shows a transudate a son lites criteria.  A. fib with RVR: Cardiology was consulted and she was started on IV diltiazem echo shows severe systolic dysfunction, Cardizem was DC'd and was transitioned to oral amiodarone. LFTs were baseline on admission, have worsened after starting amiodarone. CHA2DS2-VASc score 5, on Apixaban. Scheduled for cardioversion on 03/04/2017.  Elevated LFTs: AST < ALT in the thousand. As an outpatient there were normal, currently trending down. Etiology unclear question medications versus congestive hepatopathy.  Acute kidney injury: Baseline creatinine is around 1. Has been slowly trending up since admission. Shortly after initiation of diuresis.  Elevated troponins: Likely due to demand ischemia, he denies any chest pain, no changes in EKG.  Left lung small cell carcinoma: Initially plan was for prophylactic radiation, peripheral and I recommended to  hold off starting amiodarone.  Uncontrolled diabetes mellitus type 2: A1c is 8.5, we will DC Actos and metformin. Blood glucose has been running high increase Lantus divided to twice a day.   DVT prophylaxis: Eluquis Family Communication:none Disposition Plan/Barrier to D/C: transfer to telemetry Code Status:     Code Status Orders        Start     Ordered   03/01/17 0454  Full code  Continuous     03/01/17 0459    Code Status History    Date Active Date Inactive Code Status Order ID Comments User Context   02/02/2017  5:56 PM 02/05/2017  8:12 PM Full Code [id number]  Reubin Milan, MD Inpatient   10/21/2016 10:04 PM 10/22/2016  9:27 PM Full Code [id number]  Rama, Venetia Maxon, MD Inpatient   05/17/2015 10:10 PM 05/20/2015  4:49 PM Full Code [id number]  Gabriel Earing, PA-C Inpatient   03/23/2015  2:34 PM 03/23/2015 10:35 PM Full Code [id number]  Angelia Mould, MD Inpatient   12/26/2014  2:09 PM 12/27/2014  4:19 PM Full Code [id number]  Marchia Bond, MD Inpatient   05/09/2014  2:24 PM 05/10/2014  1:22 PM Full Code [id number]  Ulyses Amor, PA-C Inpatient   04/14/2014  5:03 PM 04/16/2014  5:59 PM Full Code [id number]  Louellen Molder, MD Inpatient        IV Access:    Peripheral IV   Procedures and diagnostic studies:   Dg Chest Port 1 View  Result Date: 03/03/2017 CLINICAL DATA:  Left thoracentesis.  Left pleural effusion. EXAM: PORTABLE CHEST 1 VIEW COMPARISON:  03/01/2017 FINDINGS: No pneumothorax after left thoracentesis. Decreased density at the left lung base with improved aeration at the left lung base.  Progressive hazy infiltrate in the left upper lung zone. Right lung is now clear. Power port in good position. Overall heart size and pulmonary vascularity are within normal limits considering the AP portable technique. IMPRESSION: 1. No pneumothorax after left thoracentesis. 2. Improved aeration at the left lung base. 3. Progressive infiltrate in the left upper  lobe. Electronically Signed   By: Lorriane Shire M.D.   On: 03/03/2017 10:59     Medical Consultants:    None.  Anti-Infectives:   ZOsyn  Subjective:    [name] he relates his breathing is improved after thoracocentesis. He relates he is hungry.  Objective:    Vitals:   03/04/17 2000 03/04/17 2100 03/05/17 0000 03/05/17 0400  BP: 122/84 115/64 119/75 122/77  Pulse: 74 (!) 106 71 (!) 106  Resp: (!) 29 (!) 30 (!) 28 (!) 24  Temp:   98.1 F (36.7 C) 98.1 F (36.7 C)  TempSrc:   Oral Oral  SpO2: 94% 91% 97% 96%  Weight:      Height:        Intake/Output Summary (Last 24 hours) at 03/05/17 0709 Last data filed at 03/05/17 0600  Gross per 24 hour  Intake            994.2 ml  Output              200 ml  Net            794.2 ml   Filed Weights   03/01/17 0113  Weight: 99.8 kg (220 lb)    Exam: General exam: In no acute distress. Respiratory system: Good air movement with crackles on the right. Cardiovascular system: Irregular rate and rhythm with positive S1 no JVD. Gastrointestinal system: Abdomen is soft nontender nondistended. Central nervous system: Awake alert and oriented 3 nonfocal. Extremities: 3+ edema Skin: No rashes. Psychiatry: Mood and affect are appropriate.   Data Reviewed:    Labs: Basic Metabolic Panel:  Recent Labs Lab 03/02/17 0500 03/03/17 0711 03/04/17 0540 03/04/17 1751 03/05/17 0537  NA 134* 133* 134* 129* 133*  K 3.3* 3.2* 2.8* 3.1* 3.8  CL 100* 99* 97* 93* 97*  CO2 20* 21* 21* 21* 25  GLUCOSE 289* 316* 390* 666* 472*  BUN 39* 52* 45* 43* 45*  CREATININE 1.62* 1.57* 1.52* 1.59* 1.60*  CALCIUM 8.8* 8.8* 8.9 8.4* 9.1  MG  --   --   --   --  2.2   GFR Estimated Creatinine Clearance: 56.9 mL/min (A) (by C-G formula based on SCr of 1.6 mg/dL (H)). Liver Function Tests:  Recent Labs Lab 03/02/17 0500 03/03/17 0711 03/03/17 1057 03/04/17 0540 03/05/17 0537  AST 1,448* 1,662*  --  519* 195*  ALT 1,380* 1,611*   --  1,135* 848*  ALKPHOS 76 83  --  89 101  BILITOT 1.2 2.0*  --  2.3* 2.1*  PROT 6.6 6.3* 6.6 5.8* 6.2*  ALBUMIN 3.4* 3.4*  --  3.0* 3.2*   No results for input(s): LIPASE, AMYLASE in the last 168 hours. No results for input(s): AMMONIA in the last 168 hours. Coagulation profile  Recent Labs Lab 03/05/17 0537  INR 3.40    CBC:  Recent Labs Lab 03/01/17 0117 03/02/17 0500 03/03/17 0711 03/04/17 0540 03/05/17 0537  WBC 8.8 11.6* 10.6* 9.8 9.8  NEUTROABS 7.2 10.7* 10.0* 9.2* 9.1*  HGB 8.8* 8.8* 8.7* 9.3* 9.8*  HCT 27.3* 27.4* 26.6* 28.2* 29.4*  MCV 101.1* 100.7* 98.5 97.6 97.7  PLT  135* 111* 85* 92* 94*   Cardiac Enzymes:  Recent Labs Lab 03/01/17 0134 03/01/17 0539 03/01/17 1245 03/01/17 2047  TROPONINI 0.14* 0.23* 0.40* 0.36*   BNP (last 3 results) No results for input(s): PROBNP in the last 8760 hours. CBG:  Recent Labs Lab 03/04/17 0747 03/04/17 1249 03/04/17 1703 03/04/17 1858 03/04/17 2153  GLUCAP 370* 421* 508* 518* 434*   D-Dimer: No results for input(s): DDIMER in the last 72 hours. Hgb A1c: No results for input(s): HGBA1C in the last 72 hours. Lipid Profile: No results for input(s): CHOL, HDL, LDLCALC, TRIG, CHOLHDL, LDLDIRECT in the last 72 hours. Thyroid function studies: No results for input(s): TSH, T4TOTAL, T3FREE, THYROIDAB in the last 72 hours.  Invalid input(s): FREET3 Anemia work up: No results for input(s): VITAMINB12, FOLATE, FERRITIN, TIBC, IRON, RETICCTPCT in the last 72 hours. Sepsis Labs:  Recent Labs Lab 03/01/17 1516 03/02/17 0500 03/02/17 1105 03/02/17 1617 03/03/17 0711 03/04/17 0540 03/05/17 0537  WBC  --  11.6*  --   --  10.6* 9.8 9.8  LATICACIDVEN 2.7*  --  3.4* 3.9*  --   --  1.6   Microbiology Recent Results (from the past 240 hour(s))  Culture, blood (routine x 2) Call MD if unable to obtain prior to antibiotics being given     Status: None (Preliminary result)   Collection Time: 03/01/17  5:30 AM    Result Value Ref Range Status   Specimen Description BLOOD BLOOD RIGHT FOREARM  Final   Special Requests IN PEDIATRIC BOTTLE Blood Culture adequate volume  Final   Culture   Final    NO GROWTH 3 DAYS Performed at [facility], [place] [address] [place], [place] [address]    Report Status PENDING  Incomplete  Culture, blood (routine x 2) Call MD if unable to obtain prior to antibiotics being given     Status: None (Preliminary result)   Collection Time: 03/01/17  5:39 AM  Result Value Ref Range Status   Specimen Description BLOOD BLOOD LEFT FOREARM  Final   Special Requests IN PEDIATRIC BOTTLE Blood Culture adequate volume  Final   Culture   Final    NO GROWTH 3 DAYS Performed at [facility], [place] [address] [place], [place] [address]    Report Status PENDING  Incomplete  MRSA PCR Screening     Status: None   Collection Time: 03/01/17  2:51 PM  Result Value Ref Range Status   MRSA by PCR NEGATIVE NEGATIVE Final    Comment:        The GeneXpert MRSA Assay (FDA approved for NASAL specimens only), is one component of a comprehensive MRSA colonization surveillance program. It is not intended to diagnose MRSA infection nor to guide or monitor treatment for MRSA infections.   Culture, sputum-assessment     Status: None   Collection Time: 03/02/17  7:51 AM  Result Value Ref Range Status   Specimen Description EXPECTORATED SPUTUM  Final   Special Requests NONE  Final   Sputum evaluation THIS SPECIMEN IS ACCEPTABLE FOR SPUTUM CULTURE  Final   Report Status 03/02/2017 FINAL  Final  Culture, respiratory (NON-Expectorated)     Status: Abnormal   Collection Time: 03/02/17  7:51 AM  Result Value Ref Range Status   Specimen Description EXPECTORATED SPUTUM  Final   Special Requests NONE Reflexed from [id number]  Final   Gram Stain   Final    NO WBC SEEN FEW BUDDING YEAST SEEN RARE Lonell Grandchild  NEGATIVE RODS RARE GRAM POSITIVE RODS FEW SQUAMOUS EPITHELIAL CELLS PRESENT Performed at  [facility], [place] [address], [place], [place] [address]    Culture MULTIPLE ORGANISMS PRESENT, NONE PREDOMINANT (A)  Final   Report Status 03/04/2017 FINAL  Final  Body fluid culture     Status: None (Preliminary result)   Collection Time: 03/03/17 10:28 AM  Result Value Ref Range Status   Specimen Description PLEURAL LEFT  Final   Special Requests Normal  Final   Gram Stain   Final    RARE WBC PRESENT,BOTH PMN AND MONONUCLEAR NO ORGANISMS SEEN    Culture   Final    NO GROWTH < 24 HOURS Performed at [facility], [place] [address], [place], [place] [address]    Report Status PENDING  Incomplete     Medications:   . apixaban  5 mg Oral BID  . Chlorhexidine Gluconate Cloth  6 each Topical Q0600  . furosemide  40 mg Intravenous BID  . insulin aspart  0-20 Units Subcutaneous TID WC  . insulin aspart  0-5 Units Subcutaneous QHS  . insulin aspart  6 Units Subcutaneous TID WC  . insulin glargine  15 Units Subcutaneous Daily  . ipratropium  0.5 mg Nebulization TID  . levalbuterol  1.25 mg Nebulization TID  . methylPREDNISolone (SOLU-MEDROL) injection  60 mg Intravenous Q12H  . metoprolol succinate  100 mg Oral BID  . potassium chloride  40 mEq Oral BID  . spironolactone  25 mg Oral Daily   Continuous Infusions: . amiodarone 30 mg/hr (03/05/17 0135)  . piperacillin-tazobactam (ZOSYN)  IV 3.375 g (03/05/17 0538)      LOS: 4 days   Charlynne Cousins  [facility] Pager [phone]  *Please refer to [email or web address], password [email or web address] to get updated schedule on who will round on this patient, as hospitalists switch teams weekly. If 7PM-7AM, please contact night-coverage at [email or web address], password [email or web address] for any overnight needs.  03/05/2017, 7:09 AM

## 2017-03-05 NOTE — Telephone Encounter (Signed)
Called patient and advised him that we will start his radiation treatments on Monday depending on how he is feeling.  He verbalized understanding and agreement.  Notified Linac 1.

## 2017-03-05 NOTE — Progress Notes (Signed)
Pt fell in his room on his way back to bed from the bathroom. Pt landed on his right forearm, but does not complain of pain. States that he stepped on a monitor cord. States that he does not feel that anything is injured. Notified K. Schorr w/ [facility], did not receive orders. Notified wife, Judeen Hammans. Pt currently back in bed, resting comfortably. VSS, AOx4. Bed alarm on, call bell within reach. Will continue to monitor.

## 2017-03-05 NOTE — Progress Notes (Signed)
Name: Jesse Avila MRN: [id number] DOB: 09-12-1950    ADMISSION DATE:  03/01/2017 CONSULTATION DATE:  03/02/17  REFERRING MD :  Dr. Quincy Simmonds   CHIEF COMPLAINT:  Shortness of Breath    HISTORY OF PRESENT ILLNESS:  66 y/o M  admitted on 7/29 with a 4 week history of increasing shortness of breath and exertional dyspnea.  He also reported increased fatigue, cough with productive white sputum and bilateral lower extremity swelling.  He was recently admitted 7/2-7/5 for left PNA and AFwRVR (started on apixiban).    PMH of COPD & extensive stage small cell lung cancer diagnosed 09/2016 (followed by Dr. Sondra Come & Dr. Earlie Server - on carboplatin, etoposide, neulasta & radiotherapy) with distant metastases to mediastinum, pancreas and left iliac lesion  He was planned to start empiric whole brain radiation 7/31. His last radiation was approximately 8 weeks ago.   Initial evaluation found him to have AFwRVR.  He was started on a diltiazem gtt and given lasix.  Home apixaban was continued.  CXR was concerning for possible PNA and he was treated with vancomycin & zosyn.  IV steroids were given for bronchospasm.  He was admitted by [facility] for further evaluation of shortness of breath in the setting of possible PNA, COPD exacerbation & +/- volume overload.    CT of the chest / abd / pelvis with contrast was completed which showed decrease in size of soft tissue density in the left suprahilar region with extension into the AP window, worsened left-sided aeration with new ground-glass opacities, small left and trace right effusions, bilateral partially calcified pleural plaques, mild cirrhosis, vague left-sided iliac sclerosis & pulmonary artery enlargement.      SUBJECTIVE:  Remains On amio gtt , HR low 100s Dyspnea improved   VITAL SIGNS: Temp:  [97.5 F (36.4 C)-98.1 F (36.7 C)] 97.5 F (36.4 C) (08/02 0800) Pulse Rate:  [71-111] 106 (08/02 0400) Resp:  [17-30] 24 (08/02 0400) BP: (106-129)/(64-84)  122/77 (08/02 0400) SpO2:  [73 %-100 %] 96 % (08/02 0400)  PHYSICAL EXAMINATION: General: chronically ill appearing male in NAD, sitting up  in chair HEENT: MM pink/moist, scleral icterus  PSY: calm/appropriate Neuro: AAOx4, speech clear  CV: s1s2 rrr, no m/r/g PULM: even/non-labored, lungs bilaterally bibasilar crackles R>L, diminished on L  QG:BEEF, non-tender, bsx4 active  Extremities: warm/dry, 2+ pitting edema BLE  Skin: no rashes or lesions   Recent Labs Lab 03/04/17 0540 03/04/17 1751 03/05/17 0537  NA 134* 129* 133*  K 2.8* 3.1* 3.8  CL 97* 93* 97*  CO2 21* 21* 25  BUN 45* 43* 45*  CREATININE 1.52* 1.59* 1.60*  GLUCOSE 390* 666* 472*     Recent Labs Lab 03/03/17 0711 03/04/17 0540 03/05/17 0537  HGB 8.7* 9.3* 9.8*  HCT 26.6* 28.2* 29.4*  WBC 10.6* 9.8 9.8  PLT 85* 92* 94*    Dg Chest Port 1 View  Result Date: 03/03/2017 CLINICAL DATA:  Left thoracentesis.  Left pleural effusion. EXAM: PORTABLE CHEST 1 VIEW COMPARISON:  03/01/2017 FINDINGS: No pneumothorax after left thoracentesis. Decreased density at the left lung base with improved aeration at the left lung base. Progressive hazy infiltrate in the left upper lung zone. Right lung is now clear. Power port in good position. Overall heart size and pulmonary vascularity are within normal limits considering the AP portable technique. IMPRESSION: 1. No pneumothorax after left thoracentesis. 2. Improved aeration at the left lung base. 3. Progressive infiltrate in the left upper lobe. Electronically Signed  By: Lorriane Shire M.D.   On: 03/03/2017 10:59      SIGNIFICANT EVENTS  7/29  Admit with 4 wk hx SOB, concern for PNA, AFwRVR  CULTURES: BCx2 7/29 >> ng Sputum 7/30 >> ng  ANTIBIOTICS: Vancomycin 7/29 >> 8/1 Zosyn 7/29 >>   STUDIES:  ECHO 3/18 >> Normal LV size with EF 50%. Basal inferior and inferoseptal severe hypokinesis. Normal RV size and systolic function. No significant valvular  abnormalities.  CT Chest / ABD/ Pelvis 7/6 >> decrease in size of soft tissue density in the left suprahilar region with extension into the AP window, worsened left-sided aeration with new ground-glass opacities, small left and trace right effusions, bilateral partially calcified pleural plaques, mild cirrhosis, vague left-sided iliac sclerosis & pulmonary artery enlargement.    V/Q 7/9 >> multiple matched defects in the left lung that correlate with XRAY findings, intermediate probability V/Q  Left thora 7/31 >> 1500 cc >>transudate  ASSESSMENT / PLAN:  Discussion:  67 y/o M with COPD and Stage IV small cell lung cancer admitted with 4 week hx of SOB.  He was recently admitted for PNA and AFwRVR.  Concern for volume overload in setting of AFwRVR and COPD exacerbation.        Left Lung Opacities - favor radiation pneumonitis, DD HCAP  Left Pleural Effusion , appears transudative, pleural fluid cytology neg  Acute Hypoxic Respiratory Failure - resolved  Extensive Stage Small Cell Lung Cancer - followed by Dr. Earlie Server / Dr. Dwana Curd, awaitingr whole brain radiation ,  planned for Monday  Plan: ct zosyn x 5ds  AF-RVR - on amio gtt -TEE cardioversion planned Friday    Elevated LFT's  RUQ [place] neg for gallstones   PCCM available as needed Updated pt & wife  Kara Mead MD. Shade Flood. [facility] Pager [phone] If no response call [phone]    03/05/2017, 9:19 AM

## 2017-03-06 ENCOUNTER — Inpatient Hospital Stay (HOSPITAL_COMMUNITY): Payer: BLUE CROSS/BLUE SHIELD | Admitting: Certified Registered Nurse Anesthetist

## 2017-03-06 ENCOUNTER — Encounter (HOSPITAL_COMMUNITY)
Admission: EM | Disposition: A | Discharge status: Home / Self Care | Payer: Self-pay | Source: Home / Self Care | Attending: Internal Medicine

## 2017-03-06 ENCOUNTER — Other Ambulatory Visit (HOSPITAL_COMMUNITY): Payer: BLUE CROSS/BLUE SHIELD

## 2017-03-06 ENCOUNTER — Ambulatory Visit: Payer: BLUE CROSS/BLUE SHIELD

## 2017-03-06 ENCOUNTER — Inpatient Hospital Stay (HOSPITAL_COMMUNITY): Payer: BLUE CROSS/BLUE SHIELD

## 2017-03-06 ENCOUNTER — Encounter (HOSPITAL_COMMUNITY): Payer: Self-pay | Admitting: *Deleted

## 2017-03-06 DIAGNOSIS — I340 Nonrheumatic mitral (valve) insufficiency: Secondary | ICD-10-CM

## 2017-03-06 DIAGNOSIS — I4891 Unspecified atrial fibrillation: Secondary | ICD-10-CM

## 2017-03-06 HISTORY — PX: TEE WITHOUT CARDIOVERSION: SHX5443

## 2017-03-06 LAB — GLUCOSE, CAPILLARY
Glucose-Capillary: 211 mg/dL — ABNORMAL HIGH (ref 65–99)
Glucose-Capillary: 216 mg/dL — ABNORMAL HIGH (ref 65–99)
Glucose-Capillary: 206 mg/dL — ABNORMAL HIGH (ref 65–99)
Glucose-Capillary: 199 mg/dL — ABNORMAL HIGH (ref 65–99)

## 2017-03-06 LAB — CULTURE, BLOOD (ROUTINE X 2)
CULTURE: NO GROWTH
Culture: NO GROWTH
SPECIAL REQUESTS: ADEQUATE
Special Requests: ADEQUATE

## 2017-03-06 LAB — BODY FLUID CULTURE
Culture: NO GROWTH
SPECIAL REQUESTS: NORMAL

## 2017-03-06 LAB — BASIC METABOLIC PANEL
Anion gap: 11 (ref 5–15)
BUN: 50 mg/dL — ABNORMAL HIGH (ref 6–20)
CALCIUM: 9.5 mg/dL (ref 8.9–10.3)
CO2: 28 mmol/L (ref 22–32)
CREATININE: 1.54 mg/dL — AB (ref 0.61–1.24)
Chloride: 99 mmol/L — ABNORMAL LOW (ref 101–111)
GFR calc non Af Amer: 46 mL/min — ABNORMAL LOW (ref >60)
GFR, EST AFRICAN AMERICAN: 54 mL/min — AB (ref >60)
Glucose, Bld: 226 mg/dL — ABNORMAL HIGH (ref 65–99)
Potassium: 4.2 mmol/L (ref 3.5–5.1)
SODIUM: 138 mmol/L (ref 135–145)

## 2017-03-06 LAB — CREATININE, SERUM
Creatinine, Ser: 1.53 mg/dL — ABNORMAL HIGH (ref 0.61–1.24)
GFR, EST AFRICAN AMERICAN: 54 mL/min — AB (ref >60)
GFR, EST NON AFRICAN AMERICAN: 47 mL/min — AB (ref >60)

## 2017-03-06 SURGERY — ECHOCARDIOGRAM, TRANSESOPHAGEAL
Anesthesia: Monitor Anesthesia Care

## 2017-03-06 MED ORDER — SODIUM CHLORIDE 0.9% FLUSH
10.0000 mL | INTRAVENOUS | Status: DC | PRN
  Administered 2017-03-06 – 2017-03-10 (×3): 10 mL
  Filled 2017-03-06 (×3): qty 40

## 2017-03-06 MED ORDER — AMIODARONE HCL 200 MG PO TABS
200.0000 mg | ORAL_TABLET | Freq: Two times a day (BID) | ORAL | Status: DC
  Administered 2017-03-06 – 2017-03-10 (×8): 200 mg via ORAL
  Filled 2017-03-06 (×8): qty 1

## 2017-03-06 MED ORDER — PROPOFOL 10 MG/ML IV BOLUS
INTRAVENOUS | Status: DC | PRN
  Administered 2017-03-06 (×2): 10 mg via INTRAVENOUS

## 2017-03-06 MED ORDER — PREDNISONE 20 MG PO TABS
20.0000 mg | ORAL_TABLET | Freq: Every day | ORAL | Status: DC
  Administered 2017-03-07 – 2017-03-10 (×4): 20 mg via ORAL
  Filled 2017-03-06 (×4): qty 1

## 2017-03-06 MED ORDER — APIXABAN 5 MG PO TABS
5.0000 mg | ORAL_TABLET | Freq: Once | ORAL | Status: AC
  Administered 2017-03-06: 5 mg via ORAL
  Filled 2017-03-06: qty 1

## 2017-03-06 MED ORDER — SODIUM CHLORIDE 0.9 % IV SOLN
INTRAVENOUS | Status: DC
  Administered 2017-03-06: 13:00:00 via INTRAVENOUS

## 2017-03-06 MED ORDER — PROPOFOL 500 MG/50ML IV EMUL
INTRAVENOUS | Status: DC | PRN
  Administered 2017-03-06: 50 ug/kg/min via INTRAVENOUS

## 2017-03-06 MED ORDER — PREDNISONE 20 MG PO TABS
40.0000 mg | ORAL_TABLET | Freq: Every day | ORAL | Status: DC
Start: 2017-03-07 — End: 2017-03-06

## 2017-03-06 MED ORDER — SODIUM CHLORIDE 0.9% FLUSH
10.0000 mL | Freq: Two times a day (BID) | INTRAVENOUS | Status: DC
  Administered 2017-03-07: 40 mL
  Administered 2017-03-08 – 2017-03-10 (×3): 10 mL

## 2017-03-06 NOTE — Interval H&P Note (Signed)
History and Physical Interval Note:  03/06/2017 1:39 PM  Jesse Avila  has presented today for surgery, with the diagnosis of AFIB  The various methods of treatment have been discussed with the patient and family. After consideration of risks, benefits and other options for treatment, the patient has consented to  Procedure(s): TRANSESOPHAGEAL ECHOCARDIOGRAM (TEE) (N/A) CARDIOVERSION (N/A) as a surgical intervention .  The patient's history has been reviewed, patient examined, no change in status, stable for surgery.  I have reviewed the patient's chart and labs.  Questions were answered to the patient's satisfaction.     Tayt Moyers [employer]

## 2017-03-06 NOTE — Progress Notes (Signed)
PT Cancellation Note  Patient Details Name: KONNAR BEN MRN: [id number] DOB: 06-17-1951   Cancelled Treatment:    Reason Eval/Treat Not Completed: Patient at procedure or test/unavailable--waiting to go to [facility] for TEE. Will check back another time.    Weston Anna, MPT Pager: [phone]

## 2017-03-06 NOTE — Transfer of Care (Signed)
Immediate Anesthesia Transfer of Care Note  Patient: Jesse Avila  Procedure(s) Performed: Procedure(s): TRANSESOPHAGEAL ECHOCARDIOGRAM (TEE) (N/A)  Patient Location: Endoscopy Unit  Anesthesia Type:MAC  Level of Consciousness: awake, alert  and oriented  Airway & Oxygen Therapy: Patient Spontanous Breathing and Patient connected to nasal cannula oxygen  Post-op Assessment: Report given to RN and Post -op Vital signs reviewed and stable  Post vital signs: Reviewed and stable  Last Vitals:  Vitals:   03/06/17 1234 03/06/17 1356  BP:  107/82  Pulse: (!) 118 (!) 104  Resp: (!) 25 (!) 25  Temp: (!) 36.4 C     Last Pain:  Vitals:   03/06/17 1234  TempSrc: Oral  PainSc:          Complications: No apparent anesthesia complications

## 2017-03-06 NOTE — CV Procedure (Signed)
Procedure: TEE  Indication: Atrial fibrillation  Sedation: Per anesthesiology  Findings: Please see echo section in chart for full report.  Mildly dilated LV with severe diffuse hypokinesis, EF 15-20%.  LV thrombus not visualized.  Mildly dilated RV with moderately decreased systolic function.  Moderate left atrial enlargement.  There was prominent smoke in the left atrium and amorphous thrombus in the LA appendage.  Mild right atrial enlargement.  PFO noted by color doppler with valsalva.  Trivial TR, unable to estimate PA systolic pressure.  Mild mitral regurgitation.  Trileaflet aortic valve without significant regurgitation or stenosis.  Normal caliber aorta with grade IV plaque in the descending thoracic aorta.   Thrombus in LA appendage, will need 4 weeks anticoagulation prior to re-attempting TEE-guided DCCV.  No DCCV today.   Loralie Champagne 03/06/2017 1:51 PM

## 2017-03-06 NOTE — Progress Notes (Signed)
Pharmacist Heart Failure Core Measure Documentation  Assessment: Jesse Avila has an EF documented as 20-25%  on 03/02/2017 by ECHO  Rationale: Heart failure patients with left ventricular systolic dysfunction (LVSD) and an EF < 40% should be prescribed an angiotensin converting enzyme inhibitor (ACEI) or angiotensin receptor blocker (ARB) at discharge unless a contraindication is documented in the medical record.  This patient is not currently on an ACEI or ARB for HF.  This note is being placed in the record in order to provide documentation that a contraindication to the use of these agents is present for this encounter.  ACE Inhibitor or Angiotensin Receptor Blocker is contraindicated (specify all that apply)  []   ACEI allergy AND ARB allergy []   Angioedema []   Moderate or severe aortic stenosis []   Hyperkalemia []   Hypotension []   Renal artery stenosis [x]   Worsening renal function, preexisting renal disease or dysfunction   Jesse Avila 03/06/2017 8:55 AM

## 2017-03-06 NOTE — Progress Notes (Signed)
Progress Note  Patient Name: Jesse Avila Date of Encounter: 03/06/2017  Primary Cardiologist: Dr. Rockey Situ   Subjective   Up in chair, breathing better s/p thoracentesis 07/31, not aware of afib.  Inpatient Medications    Scheduled Meds: . apixaban  5 mg Oral BID  . Chlorhexidine Gluconate Cloth  6 each Topical Q0600  . furosemide  40 mg Intravenous BID  . insulin aspart  0-20 Units Subcutaneous TID WC  . insulin aspart  0-5 Units Subcutaneous QHS  . insulin aspart  6 Units Subcutaneous TID WC  . insulin glargine  20 Units Subcutaneous BID  . ipratropium  0.5 mg Nebulization TID  . levalbuterol  1.25 mg Nebulization TID  . methylPREDNISolone (SOLU-MEDROL) injection  60 mg Intravenous Q12H  . metoprolol succinate  100 mg Oral BID  . potassium chloride  40 mEq Oral BID  . sodium chloride flush  10-40 mL Intracatheter Q12H  . spironolactone  25 mg Oral Daily   Continuous Infusions: . amiodarone 30 mg/hr (03/06/17 0209)  . piperacillin-tazobactam (ZOSYN)  IV 3.375 g (03/06/17 0601)   PRN Meds: famotidine, guaiFENesin, ondansetron, sodium chloride flush, technetium TC 24M diethylenetriame-pentaacetic acid, zolpidem   Vital Signs    Vitals:   03/05/17 2036 03/05/17 2048 03/06/17 0513 03/06/17 0539  BP: 111/77  100/82   Pulse: 94  (!) 122   Resp:   20   Temp: (!) 97.5 F (36.4 C)  (!) 97.5 F (36.4 C)   TempSrc: Oral  Oral   SpO2: 99% 97% 98%   Weight:    219 lb 2.2 oz (99.4 kg)  Height:        Intake/Output Summary (Last 24 hours) at 03/06/17 0808 Last data filed at 03/06/17 0514  Gross per 24 hour  Intake           436.51 ml  Output              700 ml  Net          -263.49 ml   Filed Weights   03/01/17 0113 03/06/17 0539  Weight: 220 lb (99.8 kg) 219 lb 2.2 oz (99.4 kg)    Telemetry    A fib with rates 100-110 on average occ to 120 - Personally Reviewed  ECG    No new - Personally Reviewed  Physical Exam   GEN: No acute distress.  Neck: No JVD  elevation Cardiac: irreg irreg, no murmurs, rubs, or gallops.  Respiratory: decreased BS bases bilaterally with a few rales. No wheezes GI: Soft, nontender, non-distended  MS:  2+ LE edema; No deformity. Neuro:  Nonfocal  Psych: Normal affect to flat  Labs    Chemistry  Recent Labs Lab 03/03/17 0711 03/03/17 1057 03/04/17 0540 03/04/17 1751 03/05/17 0537 03/06/17 0418  NA 133*  --  134* 129* 133*  --   K 3.2*  --  2.8* 3.1* 3.8  --   CL 99*  --  97* 93* 97*  --   CO2 21*  --  21* 21* 25  --   GLUCOSE 316*  --  390* 666* 472*  --   BUN 52*  --  45* 43* 45*  --   CREATININE 1.57*  --  1.52* 1.59* 1.60* 1.53*  CALCIUM 8.8*  --  8.9 8.4* 9.1  --   PROT 6.3* 6.6 5.8*  --  6.2*  --   ALBUMIN 3.4*  --  3.0*  --  3.2*  --  AST 1,662*  --  519*  --  195*  --   ALT 1,611*  --  1,135*  --  848*  --   ALKPHOS 83  --  89  --  101  --   BILITOT 2.0*  --  2.3*  --  2.1*  --   GFRNONAA 45*  --  47* 45* 44* 47*  GFRAA 52*  --  55* 52* 51* 54*  ANIONGAP 13  --  16* 15 11  --      Hematology  Recent Labs Lab 03/03/17 0711 03/04/17 0540 03/05/17 0537  WBC 10.6* 9.8 9.8  RBC 2.70* 2.89* 3.01*  HGB 8.7* 9.3* 9.8*  HCT 26.6* 28.2* 29.4*  MCV 98.5 97.6 97.7  MCH 32.2 32.2 32.6  MCHC 32.7 33.0 33.3  RDW 20.5* 20.2* 20.6*  PLT 85* 92* 94*    Cardiac Enzymes  Recent Labs Lab 03/01/17 0134 03/01/17 0539 03/01/17 1245 03/01/17 2047  TROPONINI 0.14* 0.23* 0.40* 0.36*      BNP  Recent Labs Lab 03/01/17 0134  BNP 503.1*     DDimer   Recent Labs Lab 03/01/17 1245  DDIMER 4.06*    Lab Results  Component Value Date   TSH 0.234 (L) 10/21/2016    Radiology    No results found.  Cardiac Studies   03/02/17  ECHO Study Conclusions - Left ventricle: The cavity size was moderately dilated. Wall   thickness was increased in a pattern of mild LVH. Systolic   function was severely reduced. The estimated ejection fraction   was in the range of 20% to 25%. Diffuse  hypokinesis. The study is   not technically sufficient to allow evaluation of LV diastolic function. - Mitral valve: There was mild regurgitation. - Left atrium: The atrium was moderately dilated. - Right ventricle: The cavity size was mildly dilated. Wall   thickness was normal. - Right atrium: The atrium was mildly dilated. - Pericardium, extracardiac: A trivial pericardial effusion was   identified along the right ventricular free wall. There was no   evidence of hemodynamic compromise. There was a left pleural   effusion. Impressions: - When compared to prior echo, EF is markedly reduced.  10/22/16 Echo Study Conclusions - Left ventricle: The cavity size was normal. Wall thickness was   normal. The estimated ejection fraction was 55%. Basal inferior   and basal inferoseptal severe hypokinesis. Doppler parameters are   consistent with abnormal left ventricular relaxation (grade 1   diastolic dysfunction). - Aortic valve: There was no stenosis. - Aorta: Borderline dilated aortic root. Aortic root dimension: 36   mm (ED). - Mitral valve: There was no significant regurgitation. - Left atrium: The atrium was mildly dilated. - Right ventricle: The cavity size was normal. Systolic function   was normal. - Pulmonary arteries: No complete TR doppler jet so unable to   estimate PA systolic pressure. - Inferior vena cava: The vessel was normal in size. The   respirophasic diameter changes were in the normal range (>= 50%),   consistent with normal central venous pressure. Impressions: - Normal LV size with EF 50%. Basal inferior and inferoseptal   severe hypokinesis. Normal RV size and systolic function. No   significant valvular abnormalities.   Patient Profile     66 y.o. male with lung cancer with mets, treated with chemo and radiation who presents with pneumonia, rapid atrial fibrillation, and newly reduced EF.   Assessment & Plan    Atrial fib.  Scheduled  for TEE  cardioversion today  Will change the IV amio to PO ( will give first dose tonight )   Cardiomyopathy with EF 20-25% decreased from 50% in March 2018. presumably related to a fib.   --hx of coronary calcification on CTA of chest in Feb 2018 --known PAD -- possible ischemic eval at some point, would wait till he is otherwise more medically stable  Acute systolic and diastolic HF   continue lasix  I/O are positive. EF 20% - 25%    Large Lt pleural effusion - thoracentesis 07/31  Small cell lung carcinoma with mets-per IM/Onc   PNA on ABX. Per CCM  Elevated LFTs- on admission-1448/1380, down to 195/848  On 8/2.     Abnormal TSH- low TSH March 2018     Mertie Moores, MD  03/06/2017 8:15 AM    [facility] [address],  [address] [place], [place]  [address] Pager [phone] Phone: [phone]; Fax: [phone]

## 2017-03-06 NOTE — Anesthesia Procedure Notes (Signed)
Procedure Name: MAC Date/Time: 03/06/2017 1:30 PM Performed by: Candis Shine Pre-anesthesia Checklist: Patient identified, Emergency Drugs available, Suction available, Patient being monitored and Timeout performed Patient Re-evaluated:Patient Re-evaluated prior to induction Oxygen Delivery Method: Nasal cannula Dental Injury: Teeth and Oropharynx as per pre-operative assessment

## 2017-03-06 NOTE — H&P (View-Only) (Signed)
Progress Note  Patient Name: Jesse Avila Date of Encounter: 03/06/2017  Primary Cardiologist: Dr. Rockey Situ   Subjective   Up in chair, breathing better s/p thoracentesis 07/31, not aware of afib.  Inpatient Medications    Scheduled Meds: . apixaban  5 mg Oral BID  . Chlorhexidine Gluconate Cloth  6 each Topical Q0600  . furosemide  40 mg Intravenous BID  . insulin aspart  0-20 Units Subcutaneous TID WC  . insulin aspart  0-5 Units Subcutaneous QHS  . insulin aspart  6 Units Subcutaneous TID WC  . insulin glargine  20 Units Subcutaneous BID  . ipratropium  0.5 mg Nebulization TID  . levalbuterol  1.25 mg Nebulization TID  . methylPREDNISolone (SOLU-MEDROL) injection  60 mg Intravenous Q12H  . metoprolol succinate  100 mg Oral BID  . potassium chloride  40 mEq Oral BID  . sodium chloride flush  10-40 mL Intracatheter Q12H  . spironolactone  25 mg Oral Daily   Continuous Infusions: . amiodarone 30 mg/hr (03/06/17 0209)  . piperacillin-tazobactam (ZOSYN)  IV 3.375 g (03/06/17 0601)   PRN Meds: famotidine, guaiFENesin, ondansetron, sodium chloride flush, technetium TC 1M diethylenetriame-pentaacetic acid, zolpidem   Vital Signs    Vitals:   03/05/17 2036 03/05/17 2048 03/06/17 0513 03/06/17 0539  BP: 111/77  100/82   Pulse: 94  (!) 122   Resp:   20   Temp: (!) 97.5 F (36.4 C)  (!) 97.5 F (36.4 C)   TempSrc: Oral  Oral   SpO2: 99% 97% 98%   Weight:    219 lb 2.2 oz (99.4 kg)  Height:        Intake/Output Summary (Last 24 hours) at 03/06/17 0808 Last data filed at 03/06/17 0514  Gross per 24 hour  Intake           436.51 ml  Output              700 ml  Net          -263.49 ml   Filed Weights   03/01/17 0113 03/06/17 0539  Weight: 220 lb (99.8 kg) 219 lb 2.2 oz (99.4 kg)    Telemetry    A fib with rates 100-110 on average occ to 120 - Personally Reviewed  ECG    No new - Personally Reviewed  Physical Exam   GEN: No acute distress.  Neck: No JVD  elevation Cardiac: irreg irreg, no murmurs, rubs, or gallops.  Respiratory: decreased BS bases bilaterally with a few rales. No wheezes GI: Soft, nontender, non-distended  MS:  2+ LE edema; No deformity. Neuro:  Nonfocal  Psych: Normal affect to flat  Labs    Chemistry  Recent Labs Lab 03/03/17 0711 03/03/17 1057 03/04/17 0540 03/04/17 1751 03/05/17 0537 03/06/17 0418  NA 133*  --  134* 129* 133*  --   K 3.2*  --  2.8* 3.1* 3.8  --   CL 99*  --  97* 93* 97*  --   CO2 21*  --  21* 21* 25  --   GLUCOSE 316*  --  390* 666* 472*  --   BUN 52*  --  45* 43* 45*  --   CREATININE 1.57*  --  1.52* 1.59* 1.60* 1.53*  CALCIUM 8.8*  --  8.9 8.4* 9.1  --   PROT 6.3* 6.6 5.8*  --  6.2*  --   ALBUMIN 3.4*  --  3.0*  --  3.2*  --  AST 1,662*  --  519*  --  195*  --   ALT 1,611*  --  1,135*  --  848*  --   ALKPHOS 83  --  89  --  101  --   BILITOT 2.0*  --  2.3*  --  2.1*  --   GFRNONAA 45*  --  47* 45* 44* 47*  GFRAA 52*  --  55* 52* 51* 54*  ANIONGAP 13  --  16* 15 11  --      Hematology  Recent Labs Lab 03/03/17 0711 03/04/17 0540 03/05/17 0537  WBC 10.6* 9.8 9.8  RBC 2.70* 2.89* 3.01*  HGB 8.7* 9.3* 9.8*  HCT 26.6* 28.2* 29.4*  MCV 98.5 97.6 97.7  MCH 32.2 32.2 32.6  MCHC 32.7 33.0 33.3  RDW 20.5* 20.2* 20.6*  PLT 85* 92* 94*    Cardiac Enzymes  Recent Labs Lab 03/01/17 0134 03/01/17 0539 03/01/17 1245 03/01/17 2047  TROPONINI 0.14* 0.23* 0.40* 0.36*      BNP  Recent Labs Lab 03/01/17 0134  BNP 503.1*     DDimer   Recent Labs Lab 03/01/17 1245  DDIMER 4.06*    Lab Results  Component Value Date   TSH 0.234 (L) 10/21/2016    Radiology    No results found.  Cardiac Studies   03/02/17  ECHO Study Conclusions - Left ventricle: The cavity size was moderately dilated. Wall   thickness was increased in a pattern of mild LVH. Systolic   function was severely reduced. The estimated ejection fraction   was in the range of 20% to 25%. Diffuse  hypokinesis. The study is   not technically sufficient to allow evaluation of LV diastolic function. - Mitral valve: There was mild regurgitation. - Left atrium: The atrium was moderately dilated. - Right ventricle: The cavity size was mildly dilated. Wall   thickness was normal. - Right atrium: The atrium was mildly dilated. - Pericardium, extracardiac: A trivial pericardial effusion was   identified along the right ventricular free wall. There was no   evidence of hemodynamic compromise. There was a left pleural   effusion. Impressions: - When compared to prior echo, EF is markedly reduced.  10/22/16 Echo Study Conclusions - Left ventricle: The cavity size was normal. Wall thickness was   normal. The estimated ejection fraction was 55%. Basal inferior   and basal inferoseptal severe hypokinesis. Doppler parameters are   consistent with abnormal left ventricular relaxation (grade 1   diastolic dysfunction). - Aortic valve: There was no stenosis. - Aorta: Borderline dilated aortic root. Aortic root dimension: 36   mm (ED). - Mitral valve: There was no significant regurgitation. - Left atrium: The atrium was mildly dilated. - Right ventricle: The cavity size was normal. Systolic function   was normal. - Pulmonary arteries: No complete TR doppler jet so unable to   estimate PA systolic pressure. - Inferior vena cava: The vessel was normal in size. The   respirophasic diameter changes were in the normal range (>= 50%),   consistent with normal central venous pressure. Impressions: - Normal LV size with EF 50%. Basal inferior and inferoseptal   severe hypokinesis. Normal RV size and systolic function. No   significant valvular abnormalities.   Patient Profile     66 y.o. male with lung cancer with mets, treated with chemo and radiation who presents with pneumonia, rapid atrial fibrillation, and newly reduced EF.   Assessment & Plan    Atrial fib.  Scheduled  for TEE  cardioversion today  Will change the IV amio to PO ( will give first dose tonight )   Cardiomyopathy with EF 20-25% decreased from 50% in March 2018. presumably related to a fib.   --hx of coronary calcification on CTA of chest in Feb 2018 --known PAD -- possible ischemic eval at some point, would wait till he is otherwise more medically stable  Acute systolic and diastolic HF   continue lasix  I/O are positive. EF 20% - 25%    Large Lt pleural effusion - thoracentesis 07/31  Small cell lung carcinoma with mets-per IM/Onc   PNA on ABX. Per CCM  Elevated LFTs- on admission-1448/1380, down to 195/848  On 8/2.     Abnormal TSH- low TSH March 2018     Mertie Moores, MD  03/06/2017 8:15 AM    [facility] [place],  [address] [place], [place]  [address] Pager [phone] Phone: [phone]; Fax: [phone]

## 2017-03-06 NOTE — Progress Notes (Addendum)
[facility] PROGRESS NOTE    Progress Note  Jesse Avila  EYC:[id number] DOB: [birth year] DOA: 03/01/2017 PCP: Jesse Arabian, MD     Brief Narrative:   Jesse Avila is an 66 y.o. male 66 year old male with past medical history small cell cancer, status post chemotherapy and radiation presents with complaint of shortness of breath was found to have postobstructive pneumonia with pleural effusion and acute on chronic systolic heart failure in A. fib with RVR.  Assessment/Plan:   Acute respiratory failure with hypoxia (HCC) Multifactorial likely due acute systolic heart failure with EF 20% and postobstructive pneumonia with large pleural effusion bilaterally: Started empirically on IV steroids IV vancomycin and cefepime, change to IV Zosyn for 5 days. Last day today It seems like his on some relief after thoracentesis. Cardiology was consulted and started [place] she was positive about 700 mL, weight on admission was 98.8 kg. PCCM was consulted recommended thoracocentesis which was performed analysis shows a transudate by lytes criteria. Cardiology rec ischemic eval as an outpatient. Positive today cont stricti I and O's, daily weights, cont IV lasix. tapared steroid off quickly.  A. fib with RVR: Cardiology was consulted and she was started on IV diltiazem echo shows severe systolic dysfunction, Cardizem was DC'd and was transitioned to oral amiodarone. LFTs were baseline on admission, worsened now improving, check in am CHA2DS2-VASc score 5, on Apixaban. For Cardioversion on 03/06/2017.  Elevated LFTs: AST < ALT in the 1000's As an outpatient there were normal, currently trending down. Etiology unclear question medications versus congestive hepatopathy.  Acute kidney injury: Baseline creatinine is around 1. Has remained stable at 1.5-1.6 Cont IV diuresis, b-met is pending  Elevated troponins: Likely due to demand ischemia, he denies any chest pain, no  changes in EKG.  Left lung small cell carcinoma: Initially plan was for prophylactic radiation, pulmonary  recommended to hold off, as we are starting amiodarone.  Uncontrolled diabetes mellitus type 2: A1c is 8.5, we will DC Actos and metformin. Blood glucose has been running high increase Lantus divided to twice a day.   DVT prophylaxis: Eluquis Family Communication:none Disposition Plan/Barrier to D/C: Home on 8.4.2018 Code Status:     Code Status Orders        Start     Ordered   03/01/17 0454  Full code  Continuous     03/01/17 0459    Code Status History    Date Active Date Inactive Code Status Order ID Comments User Context   02/02/2017  5:56 PM 02/05/2017  8:12 PM Full Code [id number]  Jesse Milan, MD Inpatient   10/21/2016 10:04 PM 10/22/2016  9:27 PM Full Code [id number]  Rama, Venetia Maxon, MD Inpatient   05/17/2015 10:10 PM 05/20/2015  4:49 PM Full Code [id number]  Gabriel Earing, PA-C Inpatient   03/23/2015  2:34 PM 03/23/2015 10:35 PM Full Code [id number]  Angelia Mould, MD Inpatient   12/26/2014  2:09 PM 12/27/2014  4:19 PM Full Code [id number]  Marchia Bond, MD Inpatient   05/09/2014  2:24 PM 05/10/2014  1:22 PM Full Code [id number]  Ulyses Amor, PA-C Inpatient   04/14/2014  5:03 PM 04/16/2014  5:59 PM Full Code [id number]  Louellen Molder, MD Inpatient        IV Access:    Peripheral IV   Procedures and diagnostic studies:   No results found.   Medical Consultants:    None.  Anti-Infectives:   ZOsyn  Subjective:    Jesse Avila No new complains  Objective:    Vitals:   03/06/17 0513 03/06/17 0539 03/06/17 0841 03/06/17 0901  BP: 100/82   109/77  Pulse: (!) 122   (!) 107  Resp: 20     Temp: (!) 97.5 F (36.4 C)     TempSrc: Oral     SpO2: 98%  98% 96%  Weight:  99.4 kg (219 lb 2.2 oz)    Height:        Intake/Output Summary (Last 24 hours) at 03/06/17 0931 Last data filed at 03/06/17 0514  Gross per 24 hour  Intake            436.51 ml  Output              700 ml  Net          -263.49 ml   Filed Weights   03/01/17 0113 03/06/17 0539  Weight: 99.8 kg (220 lb) 99.4 kg (219 lb 2.2 oz)    Exam: General exam: In no acute distress. Respiratory system: Good air movement with crackles on the right. Cardiovascular system: Irregular rate and rhythm with positive S1 no JVD. Gastrointestinal system: Abdomen is soft nontender nondistended. Central nervous system: Awake alert and oriented 3 nonfocal. Extremities: 2+ edema Skin: No rashes. Psychiatry: Mood and affect are appropriate.   Data Reviewed:    Labs: Basic Metabolic Panel:  Recent Labs Lab 03/02/17 0500 03/03/17 0711 03/04/17 0540 03/04/17 1751 03/05/17 0537 03/06/17 0418  NA 134* 133* 134* 129* 133*  --   K 3.3* 3.2* 2.8* 3.1* 3.8  --   CL 100* 99* 97* 93* 97*  --   CO2 20* 21* 21* 21* 25  --   GLUCOSE 289* 316* 390* 666* 472*  --   BUN 39* 52* 45* 43* 45*  --   CREATININE 1.62* 1.57* 1.52* 1.59* 1.60* 1.53*  CALCIUM 8.8* 8.8* 8.9 8.4* 9.1  --   MG  --   --   --   --  2.2  --    GFR Estimated Creatinine Clearance: 59.3 mL/min (A) (by C-G formula based on SCr of 1.53 mg/dL (H)). Liver Function Tests:  Recent Labs Lab 03/02/17 0500 03/03/17 0711 03/03/17 1057 03/04/17 0540 03/05/17 0537  AST 1,448* 1,662*  --  519* 195*  ALT 1,380* 1,611*  --  1,135* 848*  ALKPHOS 76 83  --  89 101  BILITOT 1.2 2.0*  --  2.3* 2.1*  PROT 6.6 6.3* 6.6 5.8* 6.2*  ALBUMIN 3.4* 3.4*  --  3.0* 3.2*   No results for input(s): LIPASE, AMYLASE in the last 168 hours. No results for input(s): AMMONIA in the last 168 hours. Coagulation profile  Recent Labs Lab 03/05/17 0537  INR 3.40    CBC:  Recent Labs Lab 03/01/17 0117 03/02/17 0500 03/03/17 0711 03/04/17 0540 03/05/17 0537  WBC 8.8 11.6* 10.6* 9.8 9.8  NEUTROABS 7.2 10.7* 10.0* 9.2* 9.1*  HGB 8.8* 8.8* 8.7* 9.3* 9.8*  HCT 27.3* 27.4* 26.6* 28.2* 29.4*  MCV 101.1* 100.7* 98.5  97.6 97.7  PLT 135* 111* 85* 92* 94*   Cardiac Enzymes:  Recent Labs Lab 03/01/17 0134 03/01/17 0539 03/01/17 1245 03/01/17 2047  TROPONINI 0.14* 0.23* 0.40* 0.36*   BNP (last 3 results) No results for input(s): PROBNP in the last 8760 hours. CBG:  Recent Labs Lab 03/05/17 0739 03/05/17 1301 03/05/17 1648 03/05/17 2321 03/06/17 0756  GLUCAP 432* 375* 396* 320* 211*   D-Dimer: No results for  input(s): DDIMER in the last 72 hours. Hgb A1c: No results for input(s): HGBA1C in the last 72 hours. Lipid Profile: No results for input(s): CHOL, HDL, LDLCALC, TRIG, CHOLHDL, LDLDIRECT in the last 72 hours. Thyroid function studies: No results for input(s): TSH, T4TOTAL, T3FREE, THYROIDAB in the last 72 hours.  Invalid input(s): FREET3 Anemia work up: No results for input(s): VITAMINB12, FOLATE, FERRITIN, TIBC, IRON, RETICCTPCT in the last 72 hours. Sepsis Labs:  Recent Labs Lab 03/01/17 1516 03/02/17 0500 03/02/17 1105 03/02/17 1617 03/03/17 0711 03/04/17 0540 03/05/17 0537  WBC  --  11.6*  --   --  10.6* 9.8 9.8  LATICACIDVEN 2.7*  --  3.4* 3.9*  --   --  1.6   Microbiology Recent Results (from the past 240 hour(s))  Culture, blood (routine x 2) Call MD if unable to obtain prior to antibiotics being given     Status: None (Preliminary result)   Collection Time: 03/01/17  5:30 AM  Result Value Ref [place] Status   Specimen Description BLOOD BLOOD RIGHT FOREARM  Final   Special Requests IN PEDIATRIC BOTTLE Blood Culture adequate volume  Final   Culture   Final    NO GROWTH 4 DAYS Performed at [facility], [place] [address] [place], [place] [address]    Report Status PENDING  Incomplete  Culture, blood (routine x 2) Call MD if unable to obtain prior to antibiotics being given     Status: None (Preliminary result)   Collection Time: 03/01/17  5:39 AM  Result Value Ref [place] Status   Specimen Description BLOOD BLOOD LEFT FOREARM  Final   Special Requests IN  PEDIATRIC BOTTLE Blood Culture adequate volume  Final   Culture   Final    NO GROWTH 4 DAYS Performed at [facility], [place] [address], [place], [place] [address]    Report Status PENDING  Incomplete  MRSA PCR Screening     Status: None   Collection Time: 03/01/17  2:51 PM  Result Value Ref [place] Status   MRSA by PCR NEGATIVE NEGATIVE Final    Comment:        The GeneXpert MRSA Assay (FDA approved for NASAL specimens only), is one component of a comprehensive MRSA colonization surveillance program. It is not intended to diagnose MRSA infection nor to guide or monitor treatment for MRSA infections.   Culture, sputum-assessment     Status: None   Collection Time: 03/02/17  7:51 AM  Result Value Ref [place] Status   Specimen Description EXPECTORATED SPUTUM  Final   Special Requests NONE  Final   Sputum evaluation THIS SPECIMEN IS ACCEPTABLE FOR SPUTUM CULTURE  Final   Report Status 03/02/2017 FINAL  Final  Culture, respiratory (NON-Expectorated)     Status: Abnormal   Collection Time: 03/02/17  7:51 AM  Result Value Ref [place] Status   Specimen Description EXPECTORATED SPUTUM  Final   Special Requests NONE Reflexed from [id number]  Final   Gram Stain   Final    NO WBC SEEN FEW BUDDING YEAST SEEN RARE GRAM NEGATIVE RODS RARE GRAM POSITIVE RODS FEW SQUAMOUS EPITHELIAL CELLS PRESENT Performed at [facility], [address], [place], [place] [address]    Culture MULTIPLE ORGANISMS PRESENT, NONE PREDOMINANT (A)  Final   Report Status 03/04/2017 FINAL  Final  Body fluid culture     Status: None (Preliminary result)   Collection Time: 03/03/17 10:28 AM  Result Value Ref [place] Status   Specimen  Description PLEURAL LEFT  Final   Special Requests Normal  Final   Gram Stain   Final    RARE WBC PRESENT,BOTH PMN AND MONONUCLEAR NO ORGANISMS SEEN    Culture   Final    NO GROWTH 3 DAYS Performed at [facility], [address], [place], [place] [address]    Report  Status PENDING  Incomplete     Medications:   . amiodarone  200 mg Oral BID  . apixaban  5 mg Oral BID  . Chlorhexidine Gluconate Cloth  6 each Topical Q0600  . furosemide  40 mg Intravenous BID  . insulin aspart  0-20 Units Subcutaneous TID WC  . insulin aspart  0-5 Units Subcutaneous QHS  . insulin aspart  6 Units Subcutaneous TID WC  . insulin glargine  20 Units Subcutaneous BID  . ipratropium  0.5 mg Nebulization TID  . levalbuterol  1.25 mg Nebulization TID  . methylPREDNISolone (SOLU-MEDROL) injection  60 mg Intravenous Q12H  . metoprolol succinate  100 mg Oral BID  . potassium chloride  40 mEq Oral BID  . sodium chloride flush  10-40 mL Intracatheter Q12H  . spironolactone  25 mg Oral Daily   Continuous Infusions: . piperacillin-tazobactam (ZOSYN)  IV 3.375 g (03/06/17 0601)      LOS: 5 days   Charlynne Cousins  [facility] Pager [phone]  *Please refer to [email or web address], password [email or web address] to get updated schedule on who will round on this patient, as hospitalists switch teams weekly. If 7PM-7AM, please contact night-coverage at [email or web address], password [email or web address] for any overnight needs.  03/06/2017, 9:31 AM

## 2017-03-07 ENCOUNTER — Encounter (HOSPITAL_COMMUNITY): Payer: Self-pay | Admitting: Cardiology

## 2017-03-07 DIAGNOSIS — N179 Acute kidney failure, unspecified: Secondary | ICD-10-CM

## 2017-03-07 LAB — CBC
HCT: 25.5 % — ABNORMAL LOW (ref 39.0–52.0)
Hemoglobin: 8.3 g/dL — ABNORMAL LOW (ref 13.0–17.0)
MCH: 32.2 pg (ref 26.0–34.0)
MCHC: 32.5 g/dL (ref 30.0–36.0)
MCV: 98.8 fL (ref 78.0–100.0)
PLATELETS: 101 10*3/uL — AB (ref 150–400)
RBC: 2.58 MIL/uL — AB (ref 4.22–5.81)
RDW: 21.5 % — ABNORMAL HIGH (ref 11.5–15.5)
WBC: 9.6 10*3/uL (ref 4.0–10.5)

## 2017-03-07 LAB — BASIC METABOLIC PANEL
Anion gap: 9 (ref 5–15)
BUN: 52 mg/dL — ABNORMAL HIGH (ref 6–20)
CALCIUM: 9.4 mg/dL (ref 8.9–10.3)
CO2: 31 mmol/L (ref 22–32)
CREATININE: 1.58 mg/dL — AB (ref 0.61–1.24)
Chloride: 99 mmol/L — ABNORMAL LOW (ref 101–111)
GFR, EST AFRICAN AMERICAN: 52 mL/min — AB (ref >60)
GFR, EST NON AFRICAN AMERICAN: 45 mL/min — AB (ref >60)
Glucose, Bld: 216 mg/dL — ABNORMAL HIGH (ref 65–99)
Potassium: 4.6 mmol/L (ref 3.5–5.1)
Sodium: 139 mmol/L (ref 135–145)

## 2017-03-07 LAB — TYPE AND SCREEN
ABO/RH(D): O POS
Antibody Screen: NEGATIVE

## 2017-03-07 LAB — GLUCOSE, CAPILLARY
GLUCOSE-CAPILLARY: 167 mg/dL — AB (ref 65–99)
GLUCOSE-CAPILLARY: 216 mg/dL — AB (ref 65–99)
GLUCOSE-CAPILLARY: 198 mg/dL — AB (ref 65–99)
Glucose-Capillary: 214 mg/dL — ABNORMAL HIGH (ref 65–99)

## 2017-03-07 MED ORDER — DIGOXIN 125 MCG PO TABS
0.2500 mg | ORAL_TABLET | Freq: Every day | ORAL | Status: DC
  Administered 2017-03-08 – 2017-03-10 (×3): 0.25 mg via ORAL
  Filled 2017-03-07 (×3): qty 2

## 2017-03-07 MED ORDER — DIGOXIN 0.25 MG/ML IJ SOLN
0.5000 mg | Freq: Once | INTRAMUSCULAR | Status: AC
  Administered 2017-03-07: 0.5 mg via INTRAVENOUS
  Filled 2017-03-07: qty 2

## 2017-03-07 MED ORDER — DIGOXIN 0.25 MG/ML IJ SOLN
0.2500 mg | Freq: Four times a day (QID) | INTRAMUSCULAR | Status: AC
  Administered 2017-03-07 (×2): 0.25 mg via INTRAVENOUS
  Filled 2017-03-07 (×2): qty 1

## 2017-03-07 MED ORDER — FUROSEMIDE 10 MG/ML IJ SOLN
80.0000 mg | Freq: Two times a day (BID) | INTRAMUSCULAR | Status: DC
  Administered 2017-03-07 – 2017-03-09 (×4): 80 mg via INTRAVENOUS
  Filled 2017-03-07 (×4): qty 8

## 2017-03-07 NOTE — Progress Notes (Signed)
[facility] PROGRESS NOTE    Progress Note  Jesse Avila  DGU:[id number] DOB: August 29, 1950 DOA: 03/01/2017 PCP: [name], MD     Brief Narrative:   Jesse Avila is an 66 y.o. male  with past medical history small cell cancer, status post chemotherapy and radiation presents with complaint of shortness of breath was found to have postobstructive pneumonia with pleural effusion and acute on chronic systolic heart failure in A. fib with RVR.  Assessment/Plan:   Acute respiratory failure with hypoxia (HCC) Multifactorial likely due acute systolic heart failure with EF 20% and postobstructive pneumonia with large pleural effusion bilaterally: Started empirically on IV steroids and IV vancomycin and IV Zosyn . Received 5 days of IV antibiotics. Tapering steroids.  Pleural fluid culture; no growth.  It seems like his on some relief after thoracentesis. Cardiology was consulted and started diuresing.  Plan to increase lasix to 80 Mg IV BID.  PCCM was consulted recommended thoracocentesis which was performed analysis shows a transudate by lytes criteria. Cardiology rec ischemic eval as an outpatient. Urine out put 600 yesterday.   A. fib with RVR: Cardiology was consulted and she was started on IV diltiazem echo shows severe systolic dysfunction, Cardizem was DC'd and was transitioned to oral amiodarone. CHA2DS2-VASc score 5, on Apixaban. For Cardioversion on 03/06/2017. TEE showed Thrombus in LA appendage. Cardioversion was cancelled.  Continue with Eliquis.  Started on digoxin.   Elevated LFTs: AST < ALT in the 1000's As an outpatient there were normal, currently trending down. Etiology unclear question medications versus congestive hepatopathy. Repeat labs in am.  Hepatitis panel negative,  [place]; no acute finding.   Acute kidney injury: Baseline creatinine is around 1. Has remained stable at 1.5-1.6 Cont IV diuresis, follow renal function while on diuretics.   Elevated  troponins: Likely due to demand ischemia, he denies any chest pain, no changes in EKG.  Left lung small cell carcinoma: Initially plan was for prophylactic radiation, pulmonary  recommended to hold off, as we are starting amiodarone.  Uncontrolled diabetes mellitus type 2: A1c is 8.5, we will DC Actos and metformin. Blood glucose has been running high increase Lantus divided to twice a day. CBG better controlled.   DVT prophylaxis: Eluquis Family Communication:none Disposition Plan/Barrier to D/C: diuresing  Code Status:     Code Status Orders        Start     Ordered   03/01/17 0454  Full code  Continuous     03/01/17 0459    Code Status History    Date Active Date Inactive Code Status Order ID Comments User Context   02/02/2017  5:56 PM 02/05/2017  8:12 PM Full Code [id number]  Reubin Milan, MD Inpatient   10/21/2016 10:04 PM 10/22/2016  9:27 PM Full Code [id number]  Rama, Venetia Maxon, MD Inpatient   05/17/2015 10:10 PM 05/20/2015  4:49 PM Full Code [id number]  Gabriel Earing, PA-C Inpatient   03/23/2015  2:34 PM 03/23/2015 10:35 PM Full Code [id number]  Angelia Mould, MD Inpatient   12/26/2014  2:09 PM 12/27/2014  4:19 PM Full Code [id number]  Marchia Bond, MD Inpatient   05/09/2014  2:24 PM 05/10/2014  1:22 PM Full Code [id number]  Ulyses Amor, PA-C Inpatient   04/14/2014  5:03 PM 04/16/2014  5:59 PM Full Code [id number]  Louellen Molder, MD Inpatient        IV Access:    Peripheral IV   Procedures and diagnostic studies:  No results found.   Medical Consultants:    None.  Anti-Infectives:   ZOsyn  Subjective:    Jesse Avila No new complains  Objective:    Vitals:   03/06/17 2220 03/07/17 0202 03/07/17 0627 03/07/17 1544  BP: 113/88 (!) 123/92 112/78 113/79  Pulse: (!) 109 (!) 107 (!) 109 (!) 106  Resp: (!) 22 (!) 22 20 16   Temp: 97.6 F (36.4 C)  97.6 F (36.4 C) 97.9 F (36.6 C)  TempSrc: Oral  Oral Oral  SpO2: 95% 100% 100%  99%  Weight:   98.8 kg (217 lb 12.8 oz)   Height:        Intake/Output Summary (Last 24 hours) at 03/07/17 1616 Last data filed at 03/07/17 0900  Gross per 24 hour  Intake              280 ml  Output              600 ml  Net             -320 ml   Filed Weights   03/01/17 0113 03/06/17 0539 03/07/17 0627  Weight: 99.8 kg (220 lb) 99.4 kg (219 lb 2.2 oz) 98.8 kg (217 lb 12.8 oz)    Exam: General exam: NAD Respiratory system: respiratory effort normal. Bilateral crackles.  Cardiovascular system: IRR Gastrointestinal system; abdomen is distended, NR Central nervous system; non focal.  Extremities: plus 2 edema.  Skin: no rash  Psychiatry: Mood and affect are appropriate.   Data Reviewed:    Labs: Basic Metabolic Panel:  Recent Labs Lab 03/04/17 0540 03/04/17 1751 03/05/17 0537 03/06/17 0418 03/06/17 1818 03/07/17 0339  NA 134* 129* 133*  --  138 139  K 2.8* 3.1* 3.8  --  4.2 4.6  CL 97* 93* 97*  --  99* 99*  CO2 21* 21* 25  --  28 31  GLUCOSE 390* 666* 472*  --  226* 216*  BUN 45* 43* 45*  --  50* 52*  CREATININE 1.52* 1.59* 1.60* 1.53* 1.54* 1.58*  CALCIUM 8.9 8.4* 9.1  --  9.5 9.4  MG  --   --  2.2  --   --   --    GFR Estimated Creatinine Clearance: 57.3 mL/min (A) (by C-G formula based on SCr of 1.58 mg/dL (H)). Liver Function Tests:  Recent Labs Lab 03/02/17 0500 03/03/17 0711 03/03/17 1057 03/04/17 0540 03/05/17 0537  AST 1,448* 1,662*  --  519* 195*  ALT 1,380* 1,611*  --  1,135* 848*  ALKPHOS 76 83  --  89 101  BILITOT 1.2 2.0*  --  2.3* 2.1*  PROT 6.6 6.3* 6.6 5.8* 6.2*  ALBUMIN 3.4* 3.4*  --  3.0* 3.2*   No results for input(s): LIPASE, AMYLASE in the last 168 hours. No results for input(s): AMMONIA in the last 168 hours. Coagulation profile  Recent Labs Lab 03/05/17 0537  INR 3.40    CBC:  Recent Labs Lab 03/01/17 0117 03/02/17 0500 03/03/17 0711 03/04/17 0540 03/05/17 0537 03/07/17 0339  WBC 8.8 11.6* 10.6* 9.8 9.8 9.6    NEUTROABS 7.2 10.7* 10.0* 9.2* 9.1*  --   HGB 8.8* 8.8* 8.7* 9.3* 9.8* 8.3*  HCT 27.3* 27.4* 26.6* 28.2* 29.4* 25.5*  MCV 101.1* 100.7* 98.5 97.6 97.7 98.8  PLT 135* 111* 85* 92* 94* 101*   Cardiac Enzymes:  Recent Labs Lab 03/01/17 0134 03/01/17 0539 03/01/17 1245 03/01/17 2047  TROPONINI 0.14* 0.23* 0.40* 0.36*  BNP (last 3 results) No results for input(s): PROBNP in the last 8760 hours. CBG:  Recent Labs Lab 03/06/17 1600 03/06/17 1754 03/06/17 2201 03/07/17 0726 03/07/17 1144  GLUCAP 216* 206* 199* 167* 216*   D-Dimer: No results for input(s): DDIMER in the last 72 hours. Hgb A1c: No results for input(s): HGBA1C in the last 72 hours. Lipid Profile: No results for input(s): CHOL, HDL, LDLCALC, TRIG, CHOLHDL, LDLDIRECT in the last 72 hours. Thyroid function studies: No results for input(s): TSH, T4TOTAL, T3FREE, THYROIDAB in the last 72 hours.  Invalid input(s): FREET3 Anemia work up: No results for input(s): VITAMINB12, FOLATE, FERRITIN, TIBC, IRON, RETICCTPCT in the last 72 hours. Sepsis Labs:  Recent Labs Lab 03/01/17 1516  03/02/17 1105 03/02/17 1617 03/03/17 0711 03/04/17 0540 03/05/17 0537 03/07/17 0339  WBC  --   < >  --   --  10.6* 9.8 9.8 9.6  LATICACIDVEN 2.7*  --  3.4* 3.9*  --   --  1.6  --   < > = values in this interval not displayed. Microbiology Recent Results (from the past 240 hour(s))  Culture, blood (routine x 2) Call MD if unable to obtain prior to antibiotics being given     Status: None   Collection Time: 03/01/17  5:30 AM  Result Value Ref [place] Status   Specimen Description BLOOD BLOOD RIGHT FOREARM  Final   Special Requests IN PEDIATRIC BOTTLE Blood Culture adequate volume  Final   Culture   Final    NO GROWTH 5 DAYS Performed at [facility], [place] [address] [place], [place] [address]    Report Status 03/06/2017 FINAL  Final  Culture, blood (routine x 2) Call MD if unable to obtain prior to antibiotics being  given     Status: None   Collection Time: 03/01/17  5:39 AM  Result Value Ref [place] Status   Specimen Description BLOOD BLOOD LEFT FOREARM  Final   Special Requests IN PEDIATRIC BOTTLE Blood Culture adequate volume  Final   Culture   Final    NO GROWTH 5 DAYS Performed at [facility], [place] [address], [place], [place] [address]    Report Status 03/06/2017 FINAL  Final  MRSA PCR Screening     Status: None   Collection Time: 03/01/17  2:51 PM  Result Value Ref [place] Status   MRSA by PCR NEGATIVE NEGATIVE Final    Comment:        The GeneXpert MRSA Assay (FDA approved for NASAL specimens only), is one component of a comprehensive MRSA colonization surveillance program. It is not intended to diagnose MRSA infection nor to guide or monitor treatment for MRSA infections.   Culture, sputum-assessment     Status: None   Collection Time: 03/02/17  7:51 AM  Result Value Ref [place] Status   Specimen Description EXPECTORATED SPUTUM  Final   Special Requests NONE  Final   Sputum evaluation THIS SPECIMEN IS ACCEPTABLE FOR SPUTUM CULTURE  Final   Report Status 03/02/2017 FINAL  Final  Culture, respiratory (NON-Expectorated)     Status: Abnormal   Collection Time: 03/02/17  7:51 AM  Result Value Ref [place] Status   Specimen Description EXPECTORATED SPUTUM  Final   Special Requests NONE Reflexed from [id number]  Final   Gram Stain   Final    NO WBC SEEN FEW BUDDING YEAST SEEN RARE GRAM NEGATIVE RODS RARE GRAM POSITIVE RODS FEW SQUAMOUS EPITHELIAL CELLS PRESENT Performed at [facility],  [address] [place], [place] [address]    Culture MULTIPLE ORGANISMS PRESENT, NONE PREDOMINANT (A)  Final   Report Status 03/04/2017 FINAL  Final  Body fluid culture     Status: None   Collection Time: 03/03/17 10:28 AM  Result Value Ref [place] Status   Specimen Description PLEURAL LEFT  Final   Special Requests Normal  Final   Gram Stain   Final    RARE WBC PRESENT,BOTH PMN AND  MONONUCLEAR NO ORGANISMS SEEN    Culture   Final    NO GROWTH Performed at [facility], [place] [address], [place], [place] [address]    Report Status 03/06/2017 FINAL  Final     Medications:   . amiodarone  200 mg Oral BID  . apixaban  5 mg Oral BID  . Chlorhexidine Gluconate Cloth  6 each Topical Q0600  . digoxin  0.25 mg Intravenous Q6H  . [START ON 03/08/2017] digoxin  0.25 mg Oral Daily  . furosemide  80 mg Intravenous BID  . insulin aspart  0-20 Units Subcutaneous TID WC  . insulin aspart  0-5 Units Subcutaneous QHS  . insulin aspart  6 Units Subcutaneous TID WC  . insulin glargine  20 Units Subcutaneous BID  . ipratropium  0.5 mg Nebulization TID  . levalbuterol  1.25 mg Nebulization TID  . metoprolol succinate  100 mg Oral BID  . potassium chloride  40 mEq Oral BID  . predniSONE  20 mg Oral Q breakfast  . sodium chloride flush  10-40 mL Intracatheter Q12H  . spironolactone  25 mg Oral Daily   Continuous Infusions:     LOS: 6 days   Niel Hummer A MD.  [facility] Pager [phone]    03/07/2017, 4:16 PM

## 2017-03-07 NOTE — Progress Notes (Signed)
Progress Note  Patient Name: Jesse Avila Date of Encounter: 03/07/2017  Primary Cardiologist: Dr. Rockey Situ   Subjective   Feeling well.  Reports orthopnea.  No palpitations, chest pain or shortness of breath at rest.   Inpatient Medications    Scheduled Meds: . amiodarone  200 mg Oral BID  . apixaban  5 mg Oral BID  . Chlorhexidine Gluconate Cloth  6 each Topical Q0600  . furosemide  40 mg Intravenous BID  . insulin aspart  0-20 Units Subcutaneous TID WC  . insulin aspart  0-5 Units Subcutaneous QHS  . insulin aspart  6 Units Subcutaneous TID WC  . insulin glargine  20 Units Subcutaneous BID  . ipratropium  0.5 mg Nebulization TID  . levalbuterol  1.25 mg Nebulization TID  . metoprolol succinate  100 mg Oral BID  . potassium chloride  40 mEq Oral BID  . predniSONE  20 mg Oral Q breakfast  . sodium chloride flush  10-40 mL Intracatheter Q12H  . spironolactone  25 mg Oral Daily   Continuous Infusions:  PRN Meds: famotidine, guaiFENesin, ondansetron, sodium chloride flush, technetium TC 46M diethylenetriame-pentaacetic acid, zolpidem   Vital Signs    Vitals:   03/06/17 2115 03/06/17 2220 03/07/17 0202 03/07/17 0627  BP:  113/88 (!) 123/92 112/78  Pulse:  (!) 109 (!) 107 (!) 109  Resp:  (!) 22 (!) 22 20  Temp:  97.6 F (36.4 C)  97.6 F (36.4 C)  TempSrc:  Oral  Oral  SpO2: 93% 95% 100% 100%  Weight:    98.8 kg (217 lb 12.8 oz)  Height:        Intake/Output Summary (Last 24 hours) at 03/07/17 1012 Last data filed at 03/07/17 0900  Gross per 24 hour  Intake              430 ml  Output              600 ml  Net             -170 ml   Filed Weights   03/01/17 0113 03/06/17 0539 03/07/17 0627  Weight: 99.8 kg (220 lb) 99.4 kg (219 lb 2.2 oz) 98.8 kg (217 lb 12.8 oz)    Telemetry    Atrial fibrillation. Rate 110s to 120s. - Personally Reviewed  ECG    n/a - Personally Reviewed  Physical Exam   GEN: Chronically ill-appearing No acute distress.   Neck:  JVP to mandible.  Cardiac:  irregularly irregular. Tachycardic. No murmurs, rubs, or gallops.  Respiratory:  bibasilar crackles. No wheezes or rhonchi GI: S oft, nontender, non-distended  MS: 2+ pitting edema to knees bilaterally.  No deformity. Neuro:  Nonfocal  Psych: Normal affect   Labs    Chemistry Recent Labs Lab 03/03/17 0711 03/03/17 1057 03/04/17 0540  03/05/17 0537 03/06/17 0418 03/06/17 1818 03/07/17 0339  NA 133*  --  134*  < > 133*  --  138 139  K 3.2*  --  2.8*  < > 3.8  --  4.2 4.6  CL 99*  --  97*  < > 97*  --  99* 99*  CO2 21*  --  21*  < > 25  --  28 31  GLUCOSE 316*  --  390*  < > 472*  --  226* 216*  BUN 52*  --  45*  < > 45*  --  50* 52*  CREATININE 1.57*  --  1.52*  < >  1.60* 1.53* 1.54* 1.58*  CALCIUM 8.8*  --  8.9  < > 9.1  --  9.5 9.4  PROT 6.3* 6.6 5.8*  --  6.2*  --   --   --   ALBUMIN 3.4*  --  3.0*  --  3.2*  --   --   --   AST 1,662*  --  519*  --  195*  --   --   --   ALT 1,611*  --  1,135*  --  848*  --   --   --   ALKPHOS 83  --  89  --  101  --   --   --   BILITOT 2.0*  --  2.3*  --  2.1*  --   --   --   GFRNONAA 45*  --  47*  < > 44* 47* 46* 45*  GFRAA 52*  --  55*  < > 51* 54* 54* 52*  ANIONGAP 13  --  16*  < > 11  --  11 9  < > = values in this interval not displayed.   Hematology Recent Labs Lab 03/04/17 0540 03/05/17 0537 03/07/17 0339  WBC 9.8 9.8 9.6  RBC 2.89* 3.01* 2.58*  HGB 9.3* 9.8* 8.3*  HCT 28.2* 29.4* 25.5*  MCV 97.6 97.7 98.8  MCH 32.2 32.6 32.2  MCHC 33.0 33.3 32.5  RDW 20.2* 20.6* 21.5*  PLT 92* 94* 101*    Cardiac Enzymes Recent Labs Lab 03/01/17 0134 03/01/17 0539 03/01/17 1245 03/01/17 2047  TROPONINI 0.14* 0.23* 0.40* 0.36*   No results for input(s): TROPIPOC in the last 168 hours.   BNP Recent Labs Lab 03/01/17 0134  BNP 503.1*     DDimer  Recent Labs Lab 03/01/17 1245  DDIMER 4.06*     Radiology    No results found.  Cardiac Studies   03/02/17  ECHO Study Conclusions -  Left ventricle: The cavity size was moderately dilated. Wall thickness was increased in a pattern of mild LVH. Systolic function was severely reduced. The estimated ejection fraction was in the range of 20% to 25%. Diffuse hypokinesis. The study is not technically sufficient to allow evaluation of LV diastolicfunction. - Mitral valve: There was mild regurgitation. - Left atrium: The atrium was moderately dilated. - Right ventricle: The cavity size was mildly dilated. Wall thickness was normal. - Right atrium: The atrium was mildly dilated. - Pericardium, extracardiac: A trivial pericardial effusion was identified along the right ventricular free wall. There was no evidence of hemodynamic compromise. There was a left pleural effusion. Impressions: - When compared to prior echo, EF is markedly reduced.  10/22/16 Echo Study Conclusions - Left ventricle: The cavity size was normal. Wall thickness was normal. The estimated ejection fraction was 55%. Basal inferior and basal inferoseptal severe hypokinesis. Doppler parameters are consistent with abnormal left ventricular relaxation (grade 1 diastolic dysfunction). - Aortic valve: There was no stenosis. - Aorta: Borderline dilated aortic root. Aortic root dimension: 36 mm (ED). - Mitral valve: There was no significant regurgitation. - Left atrium: The atrium was mildly dilated. - Right ventricle: The cavity size was normal. Systolic function was normal. - Pulmonary arteries: No complete TR doppler jet so unable to estimate PA systolic pressure. - Inferior vena cava: The vessel was normal in size. The respirophasic diameter changes were in the normal range (>= 50%), consistent with normal central venous pressure. Impressions: - Normal LV size with EF 50%. Basal inferior and inferoseptal severe  hypokinesis. Normal RV size and systolic function. No significant valvular abnormalities.  Patient  Profile     66 y.o. male with metastatic small cell lung cancer admitted with pneumonia and found to have atrial fibrillation with rapid ventricular response and acute systolic and diastolic heart failure.  Assessment & Plan    # Persistent atrial fibrillation:  Jesse Avila was noted to have a LAA thrombus on TEE 8/3.  Rates remained poorly-controlled on amiodarone and metoprolol. He is not a candidate for cardioversion due to his thrombus.  Given his systolic dysfunction we will not start a calcium channel blocker. Continue metoprolol and amiodarone. He is artery on maximum doses of metoprolol. Given his renal function, ideally we would not start digoxin. However there are not many other options at this time. We will load with IV digoxin 0.5 mg times one followed by 0.25 mg every 6 hours for 2 doses. We will start 0.25 mg oral daily tomorrow.   Continue Eliquis.   # Acute systolic and diastolic heart failure: LVEF this admission is 20-25%, down from 50% 10/2016. This is presumably due to atrial fibrillation with rapid ventricular response. He is significantly volume overloaded and did not diurese well yesterday. We will increase Lasix to 80 mg IV twice daily. Continue metoprolol and add digoxin as above.  He will need an ARB/Entresto as his renal function and blood pressure tolerate.    Signed, Skeet Latch, MD  03/07/2017, 10:12 AM

## 2017-03-07 NOTE — Progress Notes (Signed)
PT Cancellation Note  Patient Details Name: Jesse Avila MRN: [id number] DOB: [birth year]   Cancelled Treatment:    Reason Eval/Treat Not Completed: Medical issues which prohibited therapy (pt in afib, HR 109-132 at rest, will check back after pt receives meds. )   [name] 03/07/2017, 8:58 AM  [phone]

## 2017-03-07 NOTE — Progress Notes (Signed)
Nurse tech called this RN to pt's room. Upon arrival, pt sitting on edge of bed with IV tubing ripped in half. Blood was back flowing from his port with about 20-30cc on the floor. Pt alert and oriented, answering all questions appropriately. VSS. Pt states "I thought I was home in my own bed." IV team at bedside to flush port and change tubing. MD Hal Hope made aware. New orders for STAT CBC and type and screen placed. Will continue to monitor closely.

## 2017-03-07 NOTE — Progress Notes (Signed)
PT Cancellation Note  Patient Details Name: Jesse Avila MRN: [id number] DOB: 15-Aug-1950   Cancelled Treatment:    Reason Eval/Treat Not Completed: Medical issues which prohibited therapy (HR uncontrolled, 100-120 at rest. Will check back tomorrow. )   [name] 03/07/2017, 1:20 PM  [phone]

## 2017-03-07 NOTE — Progress Notes (Addendum)
PT Cancellation Note  Patient Details Name: LYNDALL WINDT MRN: [id number] DOB: [birth year]   Cancelled Treatment:    Reason Eval/Treat Not Completed: Medical issues which prohibited therapy (HR remains uncontrolled on amiadarone. Will follow. )   [name] 03/07/2017, 11:22 AM [phone]

## 2017-03-08 DIAGNOSIS — I5023 Acute on chronic systolic (congestive) heart failure: Secondary | ICD-10-CM

## 2017-03-08 LAB — BASIC METABOLIC PANEL
Anion gap: 9 (ref 5–15)
BUN: 49 mg/dL — AB (ref 6–20)
CHLORIDE: 96 mmol/L — AB (ref 101–111)
CO2: 34 mmol/L — ABNORMAL HIGH (ref 22–32)
CREATININE: 1.36 mg/dL — AB (ref 0.61–1.24)
Calcium: 9.4 mg/dL (ref 8.9–10.3)
GFR calc Af Amer: >60 mL/min (ref >60)
GFR calc non Af Amer: 54 mL/min — ABNORMAL LOW (ref >60)
GLUCOSE: 129 mg/dL — AB (ref 65–99)
POTASSIUM: 4.3 mmol/L (ref 3.5–5.1)
SODIUM: 139 mmol/L (ref 135–145)

## 2017-03-08 LAB — CBC
HEMATOCRIT: 34.3 % — AB (ref 39.0–52.0)
HEMOGLOBIN: 10.9 g/dL — AB (ref 13.0–17.0)
MCH: 31.2 pg (ref 26.0–34.0)
MCHC: 31.8 g/dL (ref 30.0–36.0)
MCV: 98.3 fL (ref 78.0–100.0)
Platelets: 76 10*3/uL — ABNORMAL LOW (ref 150–400)
RBC: 3.49 MIL/uL — ABNORMAL LOW (ref 4.22–5.81)
RDW: 21.4 % — AB (ref 11.5–15.5)
WBC: 5.9 10*3/uL (ref 4.0–10.5)

## 2017-03-08 LAB — GLUCOSE, CAPILLARY
GLUCOSE-CAPILLARY: 82 mg/dL (ref 65–99)
GLUCOSE-CAPILLARY: 55 mg/dL — AB (ref 65–99)
Glucose-Capillary: 174 mg/dL — ABNORMAL HIGH (ref 65–99)

## 2017-03-08 LAB — HEPATIC FUNCTION PANEL
ALBUMIN: 3.4 g/dL — AB (ref 3.5–5.0)
ALK PHOS: 73 U/L (ref 38–126)
ALT: 466 U/L — ABNORMAL HIGH (ref 17–63)
AST: 71 U/L — ABNORMAL HIGH (ref 15–41)
BILIRUBIN DIRECT: 0.9 mg/dL — AB (ref 0.1–0.5)
BILIRUBIN INDIRECT: 1.1 mg/dL — AB (ref 0.3–0.9)
BILIRUBIN TOTAL: 2 mg/dL — AB (ref 0.3–1.2)
Total Protein: 6.5 g/dL (ref 6.5–8.1)

## 2017-03-08 LAB — MAGNESIUM: Magnesium: 2.1 mg/dL (ref 1.7–2.4)

## 2017-03-08 MED ORDER — NYSTATIN 100000 UNIT/ML MT SUSP
5.0000 mL | Freq: Four times a day (QID) | OROMUCOSAL | Status: DC
  Administered 2017-03-08 – 2017-03-10 (×10): 500000 [IU] via ORAL
  Filled 2017-03-08 (×9): qty 5

## 2017-03-08 NOTE — Progress Notes (Signed)
[facility] PROGRESS NOTE    Progress Note  Jesse Avila  GHW:[id number] DOB: Nov 29, 1950 DOA: 03/01/2017 PCP: [name], MD     Brief Narrative:   Jesse Avila is an 66 y.o. male  with past medical history small cell cancer, status post chemotherapy and radiation presents with complaint of shortness of breath was found to have postobstructive pneumonia with pleural effusion and acute on chronic systolic heart failure in A. fib with RVR.  Assessment/Plan:   Acute respiratory failure with hypoxia (HCC) Multifactorial likely due acute systolic heart failure with EF 20% and postobstructive pneumonia with large pleural effusion bilaterally: Started empirically on IV steroids and IV vancomycin and IV Zosyn . Received 5 days of IV antibiotics. Tapering steroids.  Pleural fluid culture; no growth.  It seems like his on some relief after thoracentesis. Cardiology was consulted and started diuresing.  Continue with  lasix to 80 Mg IV BID.  PCCM was consulted recommended thoracocentesis which was performed analysis shows a transudate by lytes criteria. Cardiology rec ischemic eval as an outpatient. Urine out put 2.8 yesterday. Dyspnea improving   A. fib with RVR: Cardiology was consulted and she was started on IV diltiazem echo shows severe systolic dysfunction, Cardizem was DC'd and was transitioned to oral amiodarone. CHA2DS2-VASc score 5, on Apixaban. For Cardioversion on 03/06/2017. TEE showed Thrombus in LA appendage. Cardioversion was cancelled.  Continue with Eliquis.  Started on digoxin.   Elevated LFTs: AST < ALT in the 1000's As an outpatient there were normal, currently trending down. Etiology unclear question medications versus congestive hepatopathy. LFT trending down.  Hepatitis panel negative,  [place]; no acute finding.   Acute kidney injury: Baseline creatinine is around 1. Has remained stable at 1.5-1.6 Cont IV diuresis, follow renal function while on  diuretics.  Renal function improving.   Elevated troponins: Likely due to demand ischemia, he denies any chest pain, no changes in EKG.  Left lung small cell carcinoma: Initially plan was for prophylactic radiation, pulmonary  recommended to hold off, as we are starting amiodarone.  Uncontrolled diabetes mellitus type 2: A1c is 8.5, we will DC Actos and metformin. Blood glucose has been running high increase Lantus divided to twice a day. CBG better controlled.   DVT prophylaxis: Eluquis Family Communication:none Disposition Plan/Barrier to D/C: diuresing  Code Status:     Code Status Orders        Start     Ordered   03/01/17 0454  Full code  Continuous     03/01/17 0459    Code Status History    Date Active Date Inactive Code Status Order ID Comments User Context   02/02/2017  5:56 PM 02/05/2017  8:12 PM Full Code [id number]  Reubin Milan, MD Inpatient   10/21/2016 10:04 PM 10/22/2016  9:27 PM Full Code [id number]  Rama, Venetia Maxon, MD Inpatient   05/17/2015 10:10 PM 05/20/2015  4:49 PM Full Code [id number]  Gabriel Earing, PA-C Inpatient   03/23/2015  2:34 PM 03/23/2015 10:35 PM Full Code [id number]  Angelia Mould, MD Inpatient   12/26/2014  2:09 PM 12/27/2014  4:19 PM Full Code [id number]  Marchia Bond, MD Inpatient   05/09/2014  2:24 PM 05/10/2014  1:22 PM Full Code [id number]  Ulyses Amor, PA-C Inpatient   04/14/2014  5:03 PM 04/16/2014  5:59 PM Full Code [id number]  Louellen Molder, MD Inpatient        IV Access:    Peripheral IV   Procedures  and diagnostic studies:   No results found.   Medical Consultants:    None.  Anti-Infectives:   ZOsyn  Subjective:    Jesse Avila No new complains  Objective:    Vitals:   03/07/17 1928 03/07/17 2029 03/08/17 0635 03/08/17 0756  BP:  113/78 119/79   Pulse:  100 94   Resp:  (!) 22 20   Temp:  97.7 F (36.5 C) 97.7 F (36.5 C)   TempSrc:  Oral Oral   SpO2: 99% 99%  95%  Weight:        Height:        Intake/Output Summary (Last 24 hours) at 03/08/17 1335 Last data filed at 03/08/17 1008  Gross per 24 hour  Intake                0 ml  Output             2750 ml  Net            -2750 ml   Filed Weights   03/01/17 0113 03/06/17 0539 03/07/17 0627  Weight: 99.8 kg (220 lb) 99.4 kg (219 lb 2.2 oz) 98.8 kg (217 lb 12.8 oz)    Exam: General exam: NAD Respiratory system: respiratory effort normal, Bilateral crackles.  Cardiovascular system: S 1, S 2 IRR Gastrointestinal system; Abdomen is soft, mild tender.  Central nervous system; non focal.  Extremities: Plus edema.  Skin: no rash  Psychiatry: Mood and affect are appropriate.   Data Reviewed:    Labs: Basic Metabolic Panel:  Recent Labs Lab 03/04/17 1751 03/05/17 0537 03/06/17 0418 03/06/17 1818 03/07/17 0339 03/08/17 0323  NA 129* 133*  --  138 139 139  K 3.1* 3.8  --  4.2 4.6 4.3  CL 93* 97*  --  99* 99* 96*  CO2 21* 25  --  28 31 34*  GLUCOSE 666* 472*  --  226* 216* 129*  BUN 43* 45*  --  50* 52* 49*  CREATININE 1.59* 1.60* 1.53* 1.54* 1.58* 1.36*  CALCIUM 8.4* 9.1  --  9.5 9.4 9.4  MG  --  2.2  --   --   --  2.1   GFR Estimated Creatinine Clearance: 66.6 mL/min (A) (by C-G formula based on SCr of 1.36 mg/dL (H)). Liver Function Tests:  Recent Labs Lab 03/02/17 0500 03/03/17 0711 03/03/17 1057 03/04/17 0540 03/05/17 0537 03/08/17 0323  AST 1,448* 1,662*  --  519* 195* 71*  ALT 1,380* 1,611*  --  1,135* 848* 466*  ALKPHOS 76 83  --  89 101 73  BILITOT 1.2 2.0*  --  2.3* 2.1* 2.0*  PROT 6.6 6.3* 6.6 5.8* 6.2* 6.5  ALBUMIN 3.4* 3.4*  --  3.0* 3.2* 3.4*   No results for input(s): LIPASE, AMYLASE in the last 168 hours. No results for input(s): AMMONIA in the last 168 hours. Coagulation profile  Recent Labs Lab 03/05/17 0537  INR 3.40    CBC:  Recent Labs Lab 03/02/17 0500 03/03/17 0711 03/04/17 0540 03/05/17 0537 03/07/17 0339 03/08/17 0323  WBC 11.6* 10.6* 9.8 9.8  9.6 5.9  NEUTROABS 10.7* 10.0* 9.2* 9.1*  --   --   HGB 8.8* 8.7* 9.3* 9.8* 8.3* 10.9*  HCT 27.4* 26.6* 28.2* 29.4* 25.5* 34.3*  MCV 100.7* 98.5 97.6 97.7 98.8 98.3  PLT 111* 85* 92* 94* 101* 76*   Cardiac Enzymes:  Recent Labs Lab 03/01/17 2047  TROPONINI 0.36*   BNP (last 3 results)  No results for input(s): PROBNP in the last 8760 hours. CBG:  Recent Labs Lab 03/07/17 0726 03/07/17 1144 03/07/17 1656 03/07/17 2036 03/08/17 0726  GLUCAP 167* 216* 214* 198* 82   D-Dimer: No results for input(s): DDIMER in the last 72 hours. Hgb A1c: No results for input(s): HGBA1C in the last 72 hours. Lipid Profile: No results for input(s): CHOL, HDL, LDLCALC, TRIG, CHOLHDL, LDLDIRECT in the last 72 hours. Thyroid function studies: No results for input(s): TSH, T4TOTAL, T3FREE, THYROIDAB in the last 72 hours.  Invalid input(s): FREET3 Anemia work up: No results for input(s): VITAMINB12, FOLATE, FERRITIN, TIBC, IRON, RETICCTPCT in the last 72 hours. Sepsis Labs:  Recent Labs Lab 03/01/17 1516  03/02/17 1105 03/02/17 1617  03/04/17 0540 03/05/17 0537 03/07/17 0339 03/08/17 0323  WBC  --   < >  --   --   < > 9.8 9.8 9.6 5.9  LATICACIDVEN 2.7*  --  3.4* 3.9*  --   --  1.6  --   --   < > = values in this interval not displayed. Microbiology Recent Results (from the past 240 hour(s))  Culture, blood (routine x 2) Call MD if unable to obtain prior to antibiotics being given     Status: None   Collection Time: 03/01/17  5:30 AM  Result Value Ref Range Status   Specimen Description BLOOD BLOOD RIGHT FOREARM  Final   Special Requests IN PEDIATRIC BOTTLE Blood Culture adequate volume  Final   Culture   Final    NO GROWTH 5 DAYS Performed at [facility], [place] [address] [place], [place] [address]    Report Status 03/06/2017 FINAL  Final  Culture, blood (routine x 2) Call MD if unable to obtain prior to antibiotics being given     Status: None   Collection Time: 03/01/17   5:39 AM  Result Value Ref Range Status   Specimen Description BLOOD BLOOD LEFT FOREARM  Final   Special Requests IN PEDIATRIC BOTTLE Blood Culture adequate volume  Final   Culture   Final    NO GROWTH 5 DAYS Performed at [facility], [place] [address] [place],  [address]    Report Status 03/06/2017 FINAL  Final  MRSA PCR Screening     Status: None   Collection Time: 03/01/17  2:51 PM  Result Value Ref Range Status   MRSA by PCR NEGATIVE NEGATIVE Final    Comment:        The GeneXpert MRSA Assay (FDA approved for NASAL specimens only), is one component of a comprehensive MRSA colonization surveillance program. It is not intended to diagnose MRSA infection nor to guide or monitor treatment for MRSA infections.   Culture, sputum-assessment     Status: None   Collection Time: 03/02/17  7:51 AM  Result Value Ref Range Status   Specimen Description EXPECTORATED SPUTUM  Final   Special Requests NONE  Final   Sputum evaluation THIS SPECIMEN IS ACCEPTABLE FOR SPUTUM CULTURE  Final   Report Status 03/02/2017 FINAL  Final  Culture, respiratory (NON-Expectorated)     Status: Abnormal   Collection Time: 03/02/17  7:51 AM  Result Value Ref Range Status   Specimen Description EXPECTORATED SPUTUM  Final   Special Requests NONE Reflexed from [id number]  Final   Gram Stain   Final    NO WBC SEEN FEW BUDDING YEAST SEEN RARE GRAM NEGATIVE RODS RARE GRAM POSITIVE RODS FEW SQUAMOUS EPITHELIAL CELLS PRESENT Performed at [facility], [place] [address], [place], [place] [address]    Culture MULTIPLE ORGANISMS PRESENT, NONE PREDOMINANT (A)  Final   Report Status 03/04/2017 FINAL  Final  Body fluid culture     Status: None   Collection Time: 03/03/17 10:28 AM  Result Value Ref Range Status   Specimen Description PLEURAL LEFT  Final   Special Requests Normal  Final   Gram Stain   Final    RARE WBC PRESENT,BOTH PMN AND MONONUCLEAR NO ORGANISMS SEEN    Culture   Final    NO  GROWTH Performed at [facility], [place] [address] [place], [place] [address]    Report Status 03/06/2017 FINAL  Final     Medications:   . amiodarone  200 mg Oral BID  . apixaban  5 mg Oral BID  . Chlorhexidine Gluconate Cloth  6 each Topical Q0600  . digoxin  0.25 mg Oral Daily  . furosemide  80 mg Intravenous BID  . insulin aspart  0-20 Units Subcutaneous TID WC  . insulin aspart  0-5 Units Subcutaneous QHS  . insulin aspart  6 Units Subcutaneous TID WC  . insulin glargine  20 Units Subcutaneous BID  . ipratropium  0.5 mg Nebulization TID  . levalbuterol  1.25 mg Nebulization TID  . metoprolol succinate  100 mg Oral BID  . nystatin  5 mL Oral QID  . potassium chloride  40 mEq Oral BID  . predniSONE  20 mg Oral Q breakfast  . sodium chloride flush  10-40 mL Intracatheter Q12H  . spironolactone  25 mg Oral Daily   Continuous Infusions:     LOS: 7 days   Niel Hummer A MD.  [facility] Pager [phone]    03/08/2017, 1:35 PM

## 2017-03-08 NOTE — Progress Notes (Signed)
Progress Note  Patient Name: Jesse Avila Date of Encounter: 03/08/2017  Primary Cardiologist: Dr. Rockey Situ   Subjective   Feeling well.  Breathing is getting better.  No palpitations, chest pain or shortness of breath at rest. Wonders when he can go home.   Inpatient Medications    Scheduled Meds: . amiodarone  200 mg Oral BID  . apixaban  5 mg Oral BID  . Chlorhexidine Gluconate Cloth  6 each Topical Q0600  . digoxin  0.25 mg Oral Daily  . furosemide  80 mg Intravenous BID  . insulin aspart  0-20 Units Subcutaneous TID WC  . insulin aspart  0-5 Units Subcutaneous QHS  . insulin aspart  6 Units Subcutaneous TID WC  . insulin glargine  20 Units Subcutaneous BID  . ipratropium  0.5 mg Nebulization TID  . levalbuterol  1.25 mg Nebulization TID  . metoprolol succinate  100 mg Oral BID  . nystatin  5 mL Oral QID  . potassium chloride  40 mEq Oral BID  . predniSONE  20 mg Oral Q breakfast  . sodium chloride flush  10-40 mL Intracatheter Q12H  . spironolactone  25 mg Oral Daily   Continuous Infusions:  PRN Meds: famotidine, guaiFENesin, ondansetron, sodium chloride flush, technetium TC 75M diethylenetriame-pentaacetic acid, zolpidem   Vital Signs    Vitals:   03/07/17 1928 03/07/17 2029 03/08/17 0635 03/08/17 0756  BP:  113/78 119/79   Pulse:  100 94   Resp:  (!) 22 20   Temp:  97.7 F (36.5 C) 97.7 F (36.5 C)   TempSrc:  Oral Oral   SpO2: 99% 99%  95%  Weight:      Height:        Intake/Output Summary (Last 24 hours) at 03/08/17 0929 Last data filed at 03/08/17 0177  Gross per 24 hour  Intake              400 ml  Output             2850 ml  Net            -2450 ml   Filed Weights   03/01/17 0113 03/06/17 0539 03/07/17 0627  Weight: 99.8 kg (220 lb) 99.4 kg (219 lb 2.2 oz) 98.8 kg (217 lb 12.8 oz)    Telemetry    Atrial fibrillation. Rate 110s to 120s. - Personally Reviewed  ECG    n/a - Personally Reviewed  Physical Exam   GEN: Chronically  ill-appearing No acute distress.   Neck: JVP to mid neck. Cardiac:  irregularly irregular. No murmurs, rubs, or gallops.  Respiratory:  bibasilar crackles. No wheezes or rhonchi GI: S oft, nontender, non-distended  MS: 2+ pitting edema to upper tibia bilaterally.  No deformity. Neuro:  Nonfocal  Psych: Normal affect   Labs    Chemistry Recent Labs Lab 03/04/17 0540  03/05/17 0537  03/06/17 1818 03/07/17 0339 03/08/17 0323  NA 134*  < > 133*  --  138 139 139  K 2.8*  < > 3.8  --  4.2 4.6 4.3  CL 97*  < > 97*  --  99* 99* 96*  CO2 21*  < > 25  --  28 31 34*  GLUCOSE 390*  < > 472*  --  226* 216* 129*  BUN 45*  < > 45*  --  50* 52* 49*  CREATININE 1.52*  < > 1.60*  < > 1.54* 1.58* 1.36*  CALCIUM 8.9  < >  9.1  --  9.5 9.4 9.4  PROT 5.8*  --  6.2*  --   --   --  6.5  ALBUMIN 3.0*  --  3.2*  --   --   --  3.4*  AST 519*  --  195*  --   --   --  71*  ALT 1,135*  --  848*  --   --   --  466*  ALKPHOS 89  --  101  --   --   --  73  BILITOT 2.3*  --  2.1*  --   --   --  2.0*  GFRNONAA 47*  < > 44*  < > 46* 45* 54*  GFRAA 55*  < > 51*  < > 54* 52* >60  ANIONGAP 16*  < > 11  --  11 9 9   < > = values in this interval not displayed.   Hematology  Recent Labs Lab 03/05/17 0537 03/07/17 0339 03/08/17 0323  WBC 9.8 9.6 5.9  RBC 3.01* 2.58* 3.49*  HGB 9.8* 8.3* 10.9*  HCT 29.4* 25.5* 34.3*  MCV 97.7 98.8 98.3  MCH 32.6 32.2 31.2  MCHC 33.3 32.5 31.8  RDW 20.6* 21.5* 21.4*  PLT 94* 101* 76*    Cardiac Enzymes  Recent Labs Lab 03/01/17 1245 03/01/17 2047  TROPONINI 0.40* 0.36*   No results for input(s): TROPIPOC in the last 168 hours.   BNPNo results for input(s): BNP, PROBNP in the last 168 hours.   DDimer   Recent Labs Lab 03/01/17 1245  DDIMER 4.06*     Radiology    No results found.  Cardiac Studies   03/02/17  ECHO Study Conclusions - Left ventricle: The cavity size was moderately dilated. Wall thickness was increased in a pattern of mild LVH.  Systolic function was severely reduced. The estimated ejection fraction was in the range of 20% to 25%. Diffuse hypokinesis. The study is not technically sufficient to allow evaluation of LV diastolicfunction. - Mitral valve: There was mild regurgitation. - Left atrium: The atrium was moderately dilated. - Right ventricle: The cavity size was mildly dilated. Wall thickness was normal. - Right atrium: The atrium was mildly dilated. - Pericardium, extracardiac: A trivial pericardial effusion was identified along the right ventricular free wall. There was no evidence of hemodynamic compromise. There was a left pleural effusion. Impressions: - When compared to prior echo, EF is markedly reduced.  10/22/16 Echo Study Conclusions - Left ventricle: The cavity size was normal. Wall thickness was normal. The estimated ejection fraction was 55%. Basal inferior and basal inferoseptal severe hypokinesis. Doppler parameters are consistent with abnormal left ventricular relaxation (grade 1 diastolic dysfunction). - Aortic valve: There was no stenosis. - Aorta: Borderline dilated aortic root. Aortic root dimension: 36 mm (ED). - Mitral valve: There was no significant regurgitation. - Left atrium: The atrium was mildly dilated. - Right ventricle: The cavity size was normal. Systolic function was normal. - Pulmonary arteries: No complete TR doppler jet so unable to estimate PA systolic pressure. - Inferior vena cava: The vessel was normal in size. The respirophasic diameter changes were in the normal range (>= 50%), consistent with normal central venous pressure. Impressions: - Normal LV size with EF 50%. Basal inferior and inferoseptal severe hypokinesis. Normal RV size and systolic function. No significant valvular abnormalities.  Patient Profile     66 y.o. male with metastatic small cell lung cancer admitted with pneumonia and found to have  atrial fibrillation  with rapid ventricular response and acute systolic and diastolic heart failure.  Assessment & Plan    # Persistent atrial fibrillation:  [name] was noted to have a LAA thrombus on TEE 8/3.  Rates much better-controlled adding digoxin on 8/4. He switched to oral digoxin today.We will check a level prior to the 8/7 dose.  He is not a candidate for cardioversion due to his thrombus.  Given his systolic dysfunction he is not on a calcium channel blocker. Continue metoprolol and amiodarone. He is artery on maximum doses of metoprolol.  Continue Eliquis.   # Acute systolic and diastolic heart failure: LVEF this admission is 20-25%, down from 50% 10/2016. This is presumably due to atrial fibrillation with rapid ventricular response. He diuresed well yesterday after increasing Lasix to 80 mg twice daily.  He was net -2.3L.  His renal function is improving. Metoprolol and digoxin.  He will need an ARB/Entresto as his renal function and blood pressure tolerate.    Signed, Skeet Latch, MD  03/08/2017, 9:29 AM

## 2017-03-08 NOTE — Progress Notes (Signed)
SATURATION QUALIFICATIONS: (This note is used to comply with regulatory documentation for home oxygen)  Patient Saturations on Room Air at Rest = 96%  Patient Saturations on Room Air while Ambulating = 90%  Patient Saturations on 2Liters of oxygen while Ambulating = 92%  Please briefly explain why patient needs home oxygen:

## 2017-03-08 NOTE — Evaluation (Signed)
Physical Therapy Evaluation Patient Details Name: Jesse Avila MRN: [id number] DOB: 1951-04-14 Today's Date: 03/08/2017   History of Present Illness  65 yo male admitted with acute respiratory failure, persistent A fib, HF. TEE (+) LAA thrombus. Hx of HF, A fib, small cell cancer  Clinical Impression  On eval, pt was Min guard assist for ambulation. He walked ~500 feet while pushing IV pole. HR: 116-122 during ambulation; pt remained on [place] O2-sats >90%. Recommend daily ambulation with family or nursing. Will follow during hospital stay.     Follow Up Recommendations No PT follow up;Supervision - Intermittent    Equipment Recommendations  None recommended by PT    Recommendations for Other Services       Precautions / Restrictions Precautions Precautions: Fall Restrictions Weight Bearing Restrictions: No      Mobility  Bed Mobility               General bed mobility comments: pt oob walking around in room  Transfers Overall transfer level: Independent                  Ambulation/Gait Ambulation/Gait assistance: Min guard Ambulation Distance (Feet): 500 Feet Assistive device:  (pushed IV pole) Gait Pattern/deviations: WFL(Within Functional Limits)     General Gait Details: HR between 116-122 during ambulation. Remained on [place] O2-sats >90%. Dyspena 2/4. Pt tolerated distance well.   Stairs            Wheelchair Mobility    Modified Rankin (Stroke Patients Only)       Balance                                             Pertinent Vitals/Pain Pain Assessment: Faces Faces Pain Scale: Hurts even more Pain Location: generalized soreness in back Pain Descriptors / Indicators: Sore Pain Intervention(s): Monitored during session    Home Living Family/patient expects to be discharged to:: Private residence Living Arrangements: Spouse/significant other Available Help at Discharge: Family Type of Home: House         Home  Equipment: Environmental consultant - 2 wheels;Cane - single point      Prior Function Level of Independence: Independent               Hand Dominance        Extremity/Trunk Assessment   Upper Extremity Assessment Upper Extremity Assessment: Overall WFL for tasks assessed    Lower Extremity Assessment Lower Extremity Assessment: Overall WFL for tasks assessed    Cervical / Trunk Assessment Cervical / Trunk Assessment: Normal  Communication   Communication: No difficulties  Cognition Arousal/Alertness: Awake/alert Behavior During Therapy: WFL for tasks assessed/performed Overall Cognitive Status: Within Functional Limits for tasks assessed                                        General Comments      Exercises     Assessment/Plan    PT Assessment Patient needs continued PT services  PT Problem List Decreased activity tolerance;Decreased mobility       PT Treatment Interventions Gait training;Therapeutic activities;Therapeutic exercise;Patient/family education;Functional mobility training    PT Goals (Current goals can be found in the Care Plan section)  Acute Rehab PT Goals Patient Stated Goal: home PT Goal Formulation: With patient  Time For Goal Achievement: 03/22/17 Potential to Achieve Goals: Good    Frequency Min 3X/week   Barriers to discharge        Co-evaluation               AM-PAC PT "6 Clicks" Daily Activity  Outcome Measure Difficulty turning over in bed (including adjusting bedclothes, sheets and blankets)?: None Difficulty moving from lying on back to sitting on the side of the bed? : None Difficulty sitting down on and standing up from a chair with arms (e.g., wheelchair, bedside commode, etc,.)?: None Help needed moving to and from a bed to chair (including a wheelchair)?: None Help needed walking in hospital room?: A Little Help needed climbing 3-5 steps with a railing? : A Little 6 Click Score: 22    End of Session  Equipment Utilized During Treatment: Oxygen Activity Tolerance: Patient tolerated treatment well Patient left: in chair;with call bell/phone within reach   PT Visit Diagnosis: Difficulty in walking, not elsewhere classified (R26.2)    Time: 8416-6063 PT Time Calculation (min) (ACUTE ONLY): 15 min   Charges:   PT Evaluation $PT Eval Low Complexity: 1 Low     PT G Codes:          Weston Anna, MPT Pager: [phone]

## 2017-03-09 ENCOUNTER — Ambulatory Visit
Admission: RE | Admit: 2017-03-09 | Discharge: 2017-03-09 | Disposition: A | Discharge status: Home / Self Care | Payer: BLUE CROSS/BLUE SHIELD | Source: Ambulatory Visit | Attending: Radiation Oncology | Admitting: Radiation Oncology

## 2017-03-09 ENCOUNTER — Telehealth: Payer: Self-pay | Admitting: Oncology

## 2017-03-09 DIAGNOSIS — I5033 Acute on chronic diastolic (congestive) heart failure: Secondary | ICD-10-CM

## 2017-03-09 LAB — CBC
HCT: 38 % — ABNORMAL LOW (ref 39.0–52.0)
Hemoglobin: 12.4 g/dL — ABNORMAL LOW (ref 13.0–17.0)
MCH: 32.4 pg (ref 26.0–34.0)
MCHC: 32.6 g/dL (ref 30.0–36.0)
MCV: 99.2 fL (ref 78.0–100.0)
PLATELETS: 84 10*3/uL — AB (ref 150–400)
RBC: 3.83 MIL/uL — AB (ref 4.22–5.81)
RDW: 21.1 % — AB (ref 11.5–15.5)
WBC: 8 10*3/uL (ref 4.0–10.5)

## 2017-03-09 LAB — GLUCOSE, CAPILLARY
GLUCOSE-CAPILLARY: 89 mg/dL (ref 65–99)
GLUCOSE-CAPILLARY: 56 mg/dL — AB (ref 65–99)
GLUCOSE-CAPILLARY: 102 mg/dL — AB (ref 65–99)
GLUCOSE-CAPILLARY: 108 mg/dL — AB (ref 65–99)
Glucose-Capillary: 200 mg/dL — ABNORMAL HIGH (ref 65–99)
Glucose-Capillary: 249 mg/dL — ABNORMAL HIGH (ref 65–99)

## 2017-03-09 LAB — BASIC METABOLIC PANEL
Anion gap: 11 (ref 5–15)
BUN: 42 mg/dL — AB (ref 6–20)
CHLORIDE: 92 mmol/L — AB (ref 101–111)
CO2: 35 mmol/L — ABNORMAL HIGH (ref 22–32)
CREATININE: 1.33 mg/dL — AB (ref 0.61–1.24)
Calcium: 9.6 mg/dL (ref 8.9–10.3)
GFR, EST NON AFRICAN AMERICAN: 55 mL/min — AB (ref >60)
Glucose, Bld: 54 mg/dL — ABNORMAL LOW (ref 65–99)
POTASSIUM: 4.4 mmol/L (ref 3.5–5.1)
SODIUM: 138 mmol/L (ref 135–145)

## 2017-03-09 MED ORDER — FUROSEMIDE 40 MG PO TABS
80.0000 mg | ORAL_TABLET | Freq: Two times a day (BID) | ORAL | Status: DC
Start: 2017-03-09 — End: 2017-03-10
  Administered 2017-03-09 – 2017-03-10 (×2): 80 mg via ORAL
  Filled 2017-03-09 (×2): qty 2

## 2017-03-09 MED ORDER — INSULIN GLARGINE 100 UNIT/ML ~~LOC~~ SOLN
10.0000 [IU] | Freq: Every day | SUBCUTANEOUS | Status: DC
  Administered 2017-03-10: 10 [IU] via SUBCUTANEOUS
  Filled 2017-03-09: qty 0.1

## 2017-03-09 MED ORDER — INSULIN GLARGINE 100 UNIT/ML ~~LOC~~ SOLN
10.0000 [IU] | Freq: Two times a day (BID) | SUBCUTANEOUS | Status: DC
  Filled 2017-03-09: qty 0.1

## 2017-03-09 MED ORDER — LORAZEPAM 1 MG PO TABS
1.0000 mg | ORAL_TABLET | Freq: Four times a day (QID) | ORAL | Status: DC | PRN
  Administered 2017-03-09 – 2017-03-10 (×3): 1 mg via ORAL
  Filled 2017-03-09 (×3): qty 1

## 2017-03-09 NOTE — Progress Notes (Signed)
[facility] PROGRESS NOTE    Progress Note  Jesse Avila  FHL:[id number] DOB: Aug 26, 1950 DOA: 03/01/2017 PCP: [name], MD     Brief Narrative:   Jesse Avila is an 66 y.o. male  with past medical history small cell cancer, status post chemotherapy and radiation presents with complaint of shortness of breath was found to have postobstructive pneumonia with pleural effusion and acute on chronic systolic heart failure in A. fib with RVR.  Assessment/Plan:   Acute respiratory failure with hypoxia (HCC) Multifactorial likely due acute systolic heart failure with EF 20% and postobstructive pneumonia with large pleural effusion bilaterally: Started empirically on IV steroids and IV vancomycin and IV Zosyn . Received 5 days of IV antibiotics. Tapering steroids.  Pleural fluid culture; no growth.  It seems like his on some relief after thoracentesis. Cardiology was consulted and started diuresing.  Continue with  lasix to 80 Mg IV BID.  PCCM was consulted recommended thoracocentesis which was performed analysis shows a transudate by lytes criteria. Cardiology rec ischemic eval as an outpatient. Urine out put 2.4 yesterday. Dyspnea improving  Plan to change from IV lasix to oral lasix today/   A. fib with RVR: Cardiology was consulted and she was started on IV diltiazem echo shows severe systolic dysfunction, Cardizem was DC'd and was transitioned to oral amiodarone. CHA2DS2-VASc score 5, on Apixaban. For Cardioversion on 03/06/2017. TEE showed Thrombus in LA appendage. Cardioversion was cancelled.  Continue with Eliquis.  Started on digoxin.   Elevated LFTs: AST < ALT in the 1000's As an outpatient there were normal, currently trending down. Etiology unclear question medications versus congestive hepatopathy. LFT trending down.  Hepatitis panel negative,  [place]; no acute finding.   Acute kidney injury: Baseline creatinine is around 1. Has remained stable at 1.5-1.6 Cont  IV diuresis, follow renal function while on diuretics.  Renal function improving.   Elevated troponins: Likely due to demand ischemia, he denies any chest pain, no changes in EKG.  Left lung small cell carcinoma: Initially plan was for prophylactic radiation, pulmonary  recommended to hold off, as we are starting amiodarone.  Uncontrolled diabetes mellitus type 2: A1c is 8.5, we will DC Actos and metformin. Hypoglycemia, will hold lantus. SSI> , hold meals coverage,.  Adjust insulin depending on blood sugar.    DVT prophylaxis: Eluquis Family Communication:none Disposition Plan/Barrier to D/C: diuresing  Code Status:     Code Status Orders        Start     Ordered   03/01/17 0454  Full code  Continuous     03/01/17 0459    Code Status History    Date Active Date Inactive Code Status Order ID Comments User Context   02/02/2017  5:56 PM 02/05/2017  8:12 PM Full Code [id number]  Reubin Milan, MD Inpatient   10/21/2016 10:04 PM 10/22/2016  9:27 PM Full Code [id number]  Rama, Venetia Maxon, MD Inpatient   05/17/2015 10:10 PM 05/20/2015  4:49 PM Full Code [id number]  Gabriel Earing, PA-C Inpatient   03/23/2015  2:34 PM 03/23/2015 10:35 PM Full Code [id number]  Angelia Mould, MD Inpatient   12/26/2014  2:09 PM 12/27/2014  4:19 PM Full Code [id number]  Marchia Bond, MD Inpatient   05/09/2014  2:24 PM 05/10/2014  1:22 PM Full Code [id number]  Ulyses Amor, PA-C Inpatient   04/14/2014  5:03 PM 04/16/2014  5:59 PM Full Code [id number]  Louellen Molder, MD Inpatient  IV Access:    Peripheral IV   Procedures and diagnostic studies:   No results found.   Medical Consultants:    None.  Anti-Infectives:   ZOsyn  Subjective:    Jesse Avila No new complains. He is breathing better.  Denies chest pain   Objective:    Vitals:   03/09/17 0356 03/09/17 0500 03/09/17 1414 03/09/17 1430  BP: 113/65  115/72   Pulse: 88  82   Resp: 18  18   Temp: 97.8 F  (36.6 C)  97.9 F (36.6 C)   TempSrc: Oral  Oral   SpO2: 100%  97% 99%  Weight:  98.3 kg (216 lb 11.4 oz)    Height:        Intake/Output Summary (Last 24 hours) at 03/09/17 1455 Last data filed at 03/09/17 1426  Gross per 24 hour  Intake                0 ml  Output             2400 ml  Net            -2400 ml   Filed Weights   03/06/17 0539 03/07/17 0627 03/09/17 0500  Weight: 99.4 kg (219 lb 2.2 oz) 98.8 kg (217 lb 12.8 oz) 98.3 kg (216 lb 11.4 oz)    Exam: General exam: NAD>  Respiratory system; few crackles, normal respiratory effort.  Cardiovascular system: S 1, S 2 IRR.  Gastrointestinal system; Abdomen soft, nt  Central nervous system; Non focal.  Extremities: trace edema Skin: no rash  Psychiatry: Mood and affect are appropriate.   Data Reviewed:    Labs: Basic Metabolic Panel:  Recent Labs Lab 03/05/17 0537 03/06/17 0418 03/06/17 1818 03/07/17 0339 03/08/17 0323 03/09/17 0333  NA 133*  --  138 139 139 138  K 3.8  --  4.2 4.6 4.3 4.4  CL 97*  --  99* 99* 96* 92*  CO2 25  --  28 31 34* 35*  GLUCOSE 472*  --  226* 216* 129* 54*  BUN 45*  --  50* 52* 49* 42*  CREATININE 1.60* 1.53* 1.54* 1.58* 1.36* 1.33*  CALCIUM 9.1  --  9.5 9.4 9.4 9.6  MG 2.2  --   --   --  2.1  --    GFR Estimated Creatinine Clearance: 67.9 mL/min (A) (by C-G formula based on SCr of 1.33 mg/dL (H)). Liver Function Tests:  Recent Labs Lab 03/03/17 0711 03/03/17 1057 03/04/17 0540 03/05/17 0537 03/08/17 0323  AST 1,662*  --  519* 195* 71*  ALT 1,611*  --  1,135* 848* 466*  ALKPHOS 83  --  89 101 73  BILITOT 2.0*  --  2.3* 2.1* 2.0*  PROT 6.3* 6.6 5.8* 6.2* 6.5  ALBUMIN 3.4*  --  3.0* 3.2* 3.4*   No results for input(s): LIPASE, AMYLASE in the last 168 hours. No results for input(s): AMMONIA in the last 168 hours. Coagulation profile  Recent Labs Lab 03/05/17 0537  INR 3.40    CBC:  Recent Labs Lab 03/03/17 0711 03/04/17 0540 03/05/17 0537 03/07/17 0339  03/08/17 0323 03/09/17 0333  WBC 10.6* 9.8 9.8 9.6 5.9 8.0  NEUTROABS 10.0* 9.2* 9.1*  --   --   --   HGB 8.7* 9.3* 9.8* 8.3* 10.9* 12.4*  HCT 26.6* 28.2* 29.4* 25.5* 34.3* 38.0*  MCV 98.5 97.6 97.7 98.8 98.3 99.2  PLT 85* 92* 94* 101* 76* 84*  Cardiac Enzymes: No results for input(s): CKTOTAL, CKMB, CKMBINDEX, TROPONINI in the last 168 hours. BNP (last 3 results) No results for input(s): PROBNP in the last 8760 hours. CBG:  Recent Labs Lab 03/08/17 1659 03/09/17 0526 03/09/17 0736 03/09/17 1110 03/09/17 1233  GLUCAP 174* 89 56* 102* 108*   D-Dimer: No results for input(s): DDIMER in the last 72 hours. Hgb A1c: No results for input(s): HGBA1C in the last 72 hours. Lipid Profile: No results for input(s): CHOL, HDL, LDLCALC, TRIG, CHOLHDL, LDLDIRECT in the last 72 hours. Thyroid function studies: No results for input(s): TSH, T4TOTAL, T3FREE, THYROIDAB in the last 72 hours.  Invalid input(s): FREET3 Anemia work up: No results for input(s): VITAMINB12, FOLATE, FERRITIN, TIBC, IRON, RETICCTPCT in the last 72 hours. Sepsis Labs:  Recent Labs Lab 03/02/17 1617  03/05/17 0537 03/07/17 0339 03/08/17 0323 03/09/17 0333  WBC  --   < > 9.8 9.6 5.9 8.0  LATICACIDVEN 3.9*  --  1.6  --   --   --   < > = values in this interval not displayed. Microbiology Recent Results (from the past 240 hour(s))  Culture, blood (routine x 2) Call MD if unable to obtain prior to antibiotics being given     Status: None   Collection Time: 03/01/17  5:30 AM  Result Value Ref Range Status   Specimen Description BLOOD BLOOD RIGHT FOREARM  Final   Special Requests IN PEDIATRIC BOTTLE Blood Culture adequate volume  Final   Culture   Final    NO GROWTH 5 DAYS Performed at [facility], [place] [address] [place], [place] [address]    Report Status 03/06/2017 FINAL  Final  Culture, blood (routine x 2) Call MD if unable to obtain prior to antibiotics being given     Status: None    Collection Time: 03/01/17  5:39 AM  Result Value Ref Range Status   Specimen Description BLOOD BLOOD LEFT FOREARM  Final   Special Requests IN PEDIATRIC BOTTLE Blood Culture adequate volume  Final   Culture   Final    NO GROWTH 5 DAYS Performed at [facility], [place] [address], [place], [place] [address]    Report Status 03/06/2017 FINAL  Final  MRSA PCR Screening     Status: None   Collection Time: 03/01/17  2:51 PM  Result Value Ref Range Status   MRSA by PCR NEGATIVE NEGATIVE Final    Comment:        The GeneXpert MRSA Assay (FDA approved for NASAL specimens only), is one component of a comprehensive MRSA colonization surveillance program. It is not intended to diagnose MRSA infection nor to guide or monitor treatment for MRSA infections.   Culture, sputum-assessment     Status: None   Collection Time: 03/02/17  7:51 AM  Result Value Ref Range Status   Specimen Description EXPECTORATED SPUTUM  Final   Special Requests NONE  Final   Sputum evaluation THIS SPECIMEN IS ACCEPTABLE FOR SPUTUM CULTURE  Final   Report Status 03/02/2017 FINAL  Final  Culture, respiratory (NON-Expectorated)     Status: Abnormal   Collection Time: 03/02/17  7:51 AM  Result Value Ref Range Status   Specimen Description EXPECTORATED SPUTUM  Final   Special Requests NONE Reflexed from [id number]  Final   Gram Stain   Final    NO WBC SEEN FEW BUDDING YEAST SEEN RARE GRAM NEGATIVE RODS RARE GRAM POSITIVE RODS FEW SQUAMOUS EPITHELIAL CELLS PRESENT Performed at [facility], [place] [address], [place], [place] [address]    Culture MULTIPLE ORGANISMS PRESENT, NONE PREDOMINANT (A)  Final   Report Status 03/04/2017 FINAL  Final  Body fluid culture     Status: None   Collection Time: 03/03/17 10:28 AM  Result Value Ref Range Status   Specimen Description PLEURAL LEFT  Final   Special Requests Normal  Final   Gram Stain   Final    RARE WBC PRESENT,BOTH PMN AND MONONUCLEAR NO ORGANISMS SEEN     Culture   Final    NO GROWTH Performed at [facility], [place] [address] [place], [place] [address]    Report Status 03/06/2017 FINAL  Final     Medications:   . amiodarone  200 mg Oral BID  . apixaban  5 mg Oral BID  . Chlorhexidine Gluconate Cloth  6 each Topical Q0600  . digoxin  0.25 mg Oral Daily  . furosemide  80 mg Oral BID  . insulin aspart  0-20 Units Subcutaneous TID WC  . [START ON 03/10/2017] insulin glargine  10 Units Subcutaneous Daily  . ipratropium  0.5 mg Nebulization TID  . levalbuterol  1.25 mg Nebulization TID  . metoprolol succinate  100 mg Oral BID  . nystatin  5 mL Oral QID  . potassium chloride  40 mEq Oral BID  . predniSONE  20 mg Oral Q breakfast  . sodium chloride flush  10-40 mL Intracatheter Q12H  . spironolactone  25 mg Oral Daily   Continuous Infusions:     LOS: 8 days   Niel Hummer A MD.  [facility] Pager [phone]    03/09/2017, 2:55 PM

## 2017-03-09 NOTE — Plan of Care (Signed)
Problem: Health Behavior/Discharge Planning: Goal: Ability to manage health-related needs will improve Outcome: Progressing Pt ambulating well. Sats WDL w/o O2.

## 2017-03-09 NOTE — Progress Notes (Signed)
Progress Note  Patient Name: Jesse Avila Date of Encounter: 03/09/2017  Primary Cardiologist: Dr. Rockey Situ  Subjective   No complaints, no chest pain or SOB, walking around room without complaints.  Lower ext edema continues.  They may wish to begin radiation to brain today.  Inpatient Medications    Scheduled Meds: . amiodarone  200 mg Oral BID  . apixaban  5 mg Oral BID  . Chlorhexidine Gluconate Cloth  6 each Topical Q0600  . digoxin  0.25 mg Oral Daily  . furosemide  80 mg Intravenous BID  . insulin aspart  0-20 Units Subcutaneous TID WC  . insulin glargine  10 Units Subcutaneous BID  . ipratropium  0.5 mg Nebulization TID  . levalbuterol  1.25 mg Nebulization TID  . metoprolol succinate  100 mg Oral BID  . nystatin  5 mL Oral QID  . potassium chloride  40 mEq Oral BID  . predniSONE  20 mg Oral Q breakfast  . sodium chloride flush  10-40 mL Intracatheter Q12H  . spironolactone  25 mg Oral Daily   Continuous Infusions:  PRN Meds: famotidine, guaiFENesin, ondansetron, sodium chloride flush, technetium TC 28M diethylenetriame-pentaacetic acid, zolpidem   Vital Signs    Vitals:   03/08/17 2114 03/08/17 2300 03/09/17 0356 03/09/17 0500  BP:  104/66 113/65   Pulse:   88   Resp:   18   Temp:   97.8 F (36.6 C)   TempSrc:   Oral   SpO2: 98%  100%   Weight:    216 lb 11.4 oz (98.3 kg)  Height:       No intake or output data in the 24 hours ending 03/09/17 1046 Filed Weights   03/06/17 0539 03/07/17 0627 03/09/17 0500  Weight: 219 lb 2.2 oz (99.4 kg) 217 lb 12.8 oz (98.8 kg) 216 lb 11.4 oz (98.3 kg)    Telemetry    A fib with rates up to 110 at times, mostly 90-105 - Personally Reviewed  ECG    No new - Personally Reviewed  Physical Exam   GEN: No acute distress.   Neck: No JVD Cardiac: irreg irreg, no murmurs, rubs, or gallops.  Respiratory: Clear to auscultation bilaterally. Though diminished in bases  GI: Soft, nontender, non-distended  MS: 2-3+  edema from just below knees and down; No deformity. Neuro:  Nonfocal  Psych: Normal affect   Labs    Chemistry Recent Labs Lab 03/04/17 0540  03/05/17 0537  03/07/17 0339 03/08/17 0323 03/09/17 0333  NA 134*  < > 133*  < > 139 139 138  K 2.8*  < > 3.8  < > 4.6 4.3 4.4  CL 97*  < > 97*  < > 99* 96* 92*  CO2 21*  < > 25  < > 31 34* 35*  GLUCOSE 390*  < > 472*  < > 216* 129* 54*  BUN 45*  < > 45*  < > 52* 49* 42*  CREATININE 1.52*  < > 1.60*  < > 1.58* 1.36* 1.33*  CALCIUM 8.9  < > 9.1  < > 9.4 9.4 9.6  PROT 5.8*  --  6.2*  --   --  6.5  --   ALBUMIN 3.0*  --  3.2*  --   --  3.4*  --   AST 519*  --  195*  --   --  71*  --   ALT 1,135*  --  848*  --   --  466*  --   ALKPHOS 89  --  101  --   --  73  --   BILITOT 2.3*  --  2.1*  --   --  2.0*  --   GFRNONAA 47*  < > 44*  < > 45* 54* 55*  GFRAA 55*  < > 51*  < > 52* >60 >60  ANIONGAP 16*  < > 11  < > 9 9 11   < > = values in this interval not displayed.   Hematology Recent Labs Lab 03/07/17 0339 03/08/17 0323 03/09/17 0333  WBC 9.6 5.9 8.0  RBC 2.58* 3.49* 3.83*  HGB 8.3* 10.9* 12.4*  HCT 25.5* 34.3* 38.0*  MCV 98.8 98.3 99.2  MCH 32.2 31.2 32.4  MCHC 32.5 31.8 32.6  RDW 21.5* 21.4* 21.1*  PLT 101* 76* 84*    Cardiac EnzymesNo results for input(s): TROPONINI in the last 168 hours. No results for input(s): TROPIPOC in the last 168 hours.   BNPNo results for input(s): BNP, PROBNP in the last 168 hours.   DDimer No results for input(s): DDIMER in the last 168 hours.   Radiology    No results found.  Cardiac Studies   03/02/17 ECHO Study Conclusions - Left ventricle: The cavity size was moderately dilated. Wall thickness was increased in a pattern of mild LVH. Systolic function was severely reduced. The estimated ejection fraction was in the range of 20% to 25%. Diffuse hypokinesis. The study is not technically sufficient to allow evaluation of LV diastolicfunction. - Mitral valve: There was mild  regurgitation. - Left atrium: The atrium was moderately dilated. - Right ventricle: The cavity size was mildly dilated. Wall thickness was normal. - Right atrium: The atrium was mildly dilated. - Pericardium, extracardiac: A trivial pericardial effusion was identified along the right ventricular free wall. There was no evidence of hemodynamic compromise. There was a left pleural effusion. Impressions: - When compared to prior echo, EF is markedly reduced.  10/22/16 Echo Study Conclusions - Left ventricle: The cavity size was normal. Wall thickness was normal. The estimated ejection fraction was 55%. Basal inferior and basal inferoseptal severe hypokinesis. Doppler parameters are consistent with abnormal left ventricular relaxation (grade 1 diastolic dysfunction). - Aortic valve: There was no stenosis. - Aorta: Borderline dilated aortic root. Aortic root dimension: 36 mm (ED). - Mitral valve: There was no significant regurgitation. - Left atrium: The atrium was mildly dilated. - Right ventricle: The cavity size was normal. Systolic function was normal. - Pulmonary arteries: No complete TR doppler jet so unable to estimate PA systolic pressure. - Inferior vena cava: The vessel was normal in size. The respirophasic diameter changes were in the normal range (>= 50%), consistent with normal central venous pressure. Impressions: - Normal LV size with EF 50%. Basal inferior and inferoseptal severe hypokinesis. Normal RV size and systolic function. No significant valvular abnormalities  Patient Profile     66 y.o. male with metastatic small cell lung cancer admitted with pneumonia and found to have atrial fibrillation with rapid ventricular response and acute systolic and diastolic heart failure  Assessment & Plan    Persistent atrial fib- per Dr. Oval Linsey "[name] was noted to have a LAA thrombus on TEE 8/3.  Rates much better-controlled adding  digoxin on 8/4. He switched to oral digoxin today.We will check a level prior to the 8/7 dose.  He is not a candidate for cardioversion due to his thrombus.  Given his systolic dysfunction he is not on  a calcium channel blocker. Continue metoprolol and amiodarone. He is artery on maximum doses of metoprolol.  Continue Eliquis. " --check dig level in AM   Acute systolic and diastolic HF.   EF new low of 20-25%, due to a fib most likely  --now neg 2305  Wt down from 220 to 216 lbs.  --on furosemide 80 mg IV BID pt wanting to go home, still with increased LEE. He stated it goes down at night.  ? Change to po lasix and if stable discharge tomorrow? --on spironolactone 25 mg--K+ 4.4, Cr 1.33 continue to decrease --ALT elevated   Cardiomyopathy with EF 20-25% -decreased from 50% in March 2018 hx of coronary calcification on CTA of chest in Feb 2018 --known PAD -- possible ischemic eval at some point, would wait till he is otherwise more medically stable   Large Lt pleural effusion - thoracentesis 07/31-per pulmonary IM  Small cell lung carcinoma with mets-per IM/Onc   PNA on ABX. Per CCM  Elevated LFTs- on admission-1448/1380, down to 195/848  On 8/2.   now 71/466 per IM  Signed, Cecilie Kicks, NP  03/09/2017, 10:46 AM

## 2017-03-09 NOTE — Progress Notes (Signed)
Physical Therapy Treatment Patient Details Name: Jesse Avila MRN: [id number] DOB: [birth year] Today's Date: 03/09/2017    History of Present Illness 66 yo male admitted with acute respiratory failure, persistent A fib, HF. TEE (+) LAA thrombus. Hx of HF, A fib, small cell cancer    PT Comments    Pt reports feeling better today and eagerly anticipates d/c home likely tomorrow.  Pt mobilizing well and denies any symptoms with activity.  Pt reports he plans to ambulate again this afternoon when his wife arrives.  Follow Up Recommendations  No PT follow up;Supervision - Intermittent     Equipment Recommendations  None recommended by PT    Recommendations for Other Services       Precautions / Restrictions Precautions Precautions: Fall    Mobility  Bed Mobility                  Transfers Overall transfer level: Independent                  Ambulation/Gait Ambulation/Gait assistance: Supervision Ambulation Distance (Feet): 420 Feet Assistive device:  (pushed IV pole) Gait Pattern/deviations: WFL(Within Functional Limits) Gait velocity: slow pace   General Gait Details: HR between 110-120s bpm during ambulation (95 bpm at rest), SPO2 remained 93-98% on room air, no c/o SOB or any other symptoms   Stairs            Wheelchair Mobility    Modified Rankin (Stroke Patients Only)       Balance                                            Cognition Arousal/Alertness: Awake/alert Behavior During Therapy: WFL for tasks assessed/performed Overall Cognitive Status: Within Functional Limits for tasks assessed                                        Exercises      General Comments        Pertinent Vitals/Pain Pain Assessment: No/denies pain    Home Living                      Prior Function            PT Goals (current goals can now be found in the care plan section) Progress towards PT goals:  Progressing toward goals    Frequency    Min 3X/week      PT Plan Current plan remains appropriate    Co-evaluation              AM-PAC PT "6 Clicks" Daily Activity  Outcome Measure  Difficulty turning over in bed (including adjusting bedclothes, sheets and blankets)?: None Difficulty moving from lying on back to sitting on the side of the bed? : None Difficulty sitting down on and standing up from a chair with arms (e.g., wheelchair, bedside commode, etc,.)?: None Help needed moving to and from a bed to chair (including a wheelchair)?: None Help needed walking in hospital room?: A Little Help needed climbing 3-5 steps with a railing? : A Little 6 Click Score: 22    End of Session   Activity Tolerance: Patient tolerated treatment well Patient left: in chair;with call bell/phone within reach   PT Visit Diagnosis:  Difficulty in walking, not elsewhere classified (R26.2)     Time: 9295-7473 PT Time Calculation (min) (ACUTE ONLY): 9 min  Charges:  $Gait Training: 8-22 mins                    G Codes:      Carmelia Bake, PT, DPT 03/09/2017 Pager: [phone]  [name] 03/09/2017, 3:08 PM

## 2017-03-09 NOTE — Progress Notes (Addendum)
Hypoglycemic Event  CBG: 54 Treatment: 15 GM carbohydrate snack   Symptoms: None  Follow-up CBG: Time:0528 CBG Result:89  Possible Reasons for Event: Medication regimen: 20 U Lantus given @ HS   Comments/MD notified: T. Opyd    Nela Bascom A Sufian Ravi

## 2017-03-09 NOTE — Telephone Encounter (Signed)
Remi Haggard, RN on [place] to see if patient is is OK to start radiation today.  She said he is OK to start radiation and can come down by wheel chair.  Notified [place] 1.

## 2017-03-10 ENCOUNTER — Ambulatory Visit
Admit: 2017-03-10 | Discharge: 2017-03-10 | Disposition: A | Discharge status: Home / Self Care | Payer: BLUE CROSS/BLUE SHIELD | Attending: Radiation Oncology | Admitting: Radiation Oncology

## 2017-03-10 ENCOUNTER — Telehealth: Payer: Self-pay | Admitting: *Deleted

## 2017-03-10 ENCOUNTER — Ambulatory Visit
Admission: RE | Admit: 2017-03-10 | Discharge: 2017-03-10 | Disposition: A | Discharge status: Home / Self Care | Payer: BLUE CROSS/BLUE SHIELD | Source: Ambulatory Visit | Attending: Radiation Oncology | Admitting: Radiation Oncology

## 2017-03-10 ENCOUNTER — Encounter (HOSPITAL_COMMUNITY): Payer: Self-pay | Admitting: Cardiology

## 2017-03-10 DIAGNOSIS — C3492 Malignant neoplasm of unspecified part of left bronchus or lung: Secondary | ICD-10-CM

## 2017-03-10 DIAGNOSIS — I236 Thrombosis of atrium, auricular appendage, and ventricle as current complications following acute myocardial infarction: Secondary | ICD-10-CM

## 2017-03-10 DIAGNOSIS — Z510 Encounter for antineoplastic radiation therapy: Secondary | ICD-10-CM | POA: Diagnosis not present

## 2017-03-10 LAB — BASIC METABOLIC PANEL
Anion gap: 7 (ref 5–15)
BUN: 40 mg/dL — ABNORMAL HIGH (ref 6–20)
CALCIUM: 9.1 mg/dL (ref 8.9–10.3)
CHLORIDE: 94 mmol/L — AB (ref 101–111)
CO2: 36 mmol/L — ABNORMAL HIGH (ref 22–32)
CREATININE: 1.46 mg/dL — AB (ref 0.61–1.24)
GFR calc non Af Amer: 49 mL/min — ABNORMAL LOW (ref >60)
GFR, EST AFRICAN AMERICAN: 57 mL/min — AB (ref >60)
Glucose, Bld: 204 mg/dL — ABNORMAL HIGH (ref 65–99)
Potassium: 4.2 mmol/L (ref 3.5–5.1)
SODIUM: 137 mmol/L (ref 135–145)

## 2017-03-10 LAB — MAGNESIUM: Magnesium: 2.1 mg/dL (ref 1.7–2.4)

## 2017-03-10 LAB — DIGOXIN LEVEL: DIGOXIN LVL: 1.2 ng/mL (ref 0.8–2.0)

## 2017-03-10 LAB — GLUCOSE, CAPILLARY
GLUCOSE-CAPILLARY: 162 mg/dL — AB (ref 65–99)
Glucose-Capillary: 188 mg/dL — ABNORMAL HIGH (ref 65–99)

## 2017-03-10 MED ORDER — PREDNISONE 20 MG PO TABS: 20.0000 mg | ORAL_TABLET | Freq: Every day | ORAL | 0 refills | Status: DC

## 2017-03-10 MED ORDER — POTASSIUM CHLORIDE CRYS ER 20 MEQ PO TBCR: 20.0000 meq | EXTENDED_RELEASE_TABLET | Freq: Every day | ORAL | 0 refills | Status: DC

## 2017-03-10 MED ORDER — DIGOXIN 250 MCG PO TABS: 0.2500 mg | ORAL_TABLET | Freq: Every day | ORAL | 0 refills | Status: DC

## 2017-03-10 MED ORDER — POTASSIUM CHLORIDE CRYS ER 20 MEQ PO TBCR: 20.0000 meq | EXTENDED_RELEASE_TABLET | Freq: Two times a day (BID) | ORAL | 0 refills | Status: DC

## 2017-03-10 MED ORDER — HEPARIN SOD (PORK) LOCK FLUSH 100 UNIT/ML IV SOLN
500.0000 [IU] | INTRAVENOUS | Status: AC | PRN
  Administered 2017-03-10: 500 [IU]

## 2017-03-10 MED ORDER — NYSTATIN 100000 UNIT/ML MT SUSP: 5.0000 mL | Freq: Four times a day (QID) | OROMUCOSAL | 0 refills | Status: DC

## 2017-03-10 MED ORDER — IPRATROPIUM BROMIDE 0.02 % IN SOLN: 0.5000 mg | Freq: Two times a day (BID) | RESPIRATORY_TRACT | Status: DC

## 2017-03-10 MED ORDER — LORAZEPAM 1 MG PO TABS: 1.0000 mg | ORAL_TABLET | Freq: Two times a day (BID) | ORAL | 0 refills | Status: DC | PRN

## 2017-03-10 MED ORDER — APIXABAN 5 MG PO TABS: 5.0000 mg | ORAL_TABLET | Freq: Two times a day (BID) | ORAL | 3 refills | Status: DC

## 2017-03-10 MED ORDER — FUROSEMIDE 40 MG PO TABS: 40.0000 mg | ORAL_TABLET | Freq: Two times a day (BID) | ORAL | 0 refills | Status: DC

## 2017-03-10 MED ORDER — LEVALBUTEROL HCL 1.25 MG/0.5ML IN NEBU: 1.2500 mg | INHALATION_SOLUTION | Freq: Two times a day (BID) | RESPIRATORY_TRACT | Status: DC

## 2017-03-10 MED ORDER — SPIRONOLACTONE 25 MG PO TABS: 25.0000 mg | ORAL_TABLET | Freq: Every day | ORAL | 0 refills | Status: DC

## 2017-03-10 MED ORDER — SONAFINE EX EMUL
1.0000 "application " | Freq: Once | CUTANEOUS | Status: AC
  Administered 2017-03-10: 1 via TOPICAL

## 2017-03-10 MED ORDER — LEVALBUTEROL HCL 1.25 MG/0.5ML IN NEBU: 1.2500 mg | INHALATION_SOLUTION | Freq: Two times a day (BID) | RESPIRATORY_TRACT | 12 refills | Status: DC | PRN

## 2017-03-10 MED ORDER — IPRATROPIUM BROMIDE 0.02 % IN SOLN: 0.5000 mg | Freq: Two times a day (BID) | RESPIRATORY_TRACT | 12 refills | Status: DC

## 2017-03-10 MED ORDER — AMIODARONE HCL 200 MG PO TABS: 200.0000 mg | ORAL_TABLET | Freq: Two times a day (BID) | ORAL | 0 refills | Status: DC

## 2017-03-10 MED ORDER — FUROSEMIDE 40 MG PO TABS: 40.0000 mg | ORAL_TABLET | Freq: Two times a day (BID) | ORAL | Status: DC

## 2017-03-10 MED ORDER — METOPROLOL SUCCINATE ER 100 MG PO TB24: 100.0000 mg | ORAL_TABLET | Freq: Two times a day (BID) | ORAL | 0 refills | Status: DC

## 2017-03-10 MED ORDER — INSULIN GLARGINE 100 UNIT/ML ~~LOC~~ SOLN: 10.0000 [IU] | Freq: Every day | SUBCUTANEOUS | 11 refills | Status: AC

## 2017-03-10 NOTE — Telephone Encounter (Signed)
-----   Message from Blain Pais sent at 03/10/2017  8:42 AM EDT ----- Regarding: tcm/ph 8/13 3pm Murray Hodgkins, NP

## 2017-03-10 NOTE — Progress Notes (Signed)
Progress Note  Patient Name: Jesse Avila Date of Encounter: 03/10/2017  Primary Cardiologist: Dr. Rockey Avila  Subjective   No complaints, no chest pain or SOB, walking around room without complaints.  Lower ext edema improved.    Inpatient Medications    Scheduled Meds: . amiodarone  200 mg Oral BID  . apixaban  5 mg Oral BID  . Chlorhexidine Gluconate Cloth  6 each Topical Q0600  . digoxin  0.25 mg Oral Daily  . furosemide  80 mg Oral BID  . insulin aspart  0-20 Units Subcutaneous TID WC  . insulin glargine  10 Units Subcutaneous Daily  . ipratropium  0.5 mg Nebulization BID  . levalbuterol  1.25 mg Nebulization BID  . metoprolol succinate  100 mg Oral BID  . nystatin  5 mL Oral QID  . potassium chloride  40 mEq Oral BID  . predniSONE  20 mg Oral Q breakfast  . sodium chloride flush  10-40 mL Intracatheter Q12H  . spironolactone  25 mg Oral Daily   Continuous Infusions:  PRN Meds: famotidine, guaiFENesin, LORazepam, ondansetron, sodium chloride flush, technetium TC 50M diethylenetriame-pentaacetic acid, zolpidem   Vital Signs    Vitals:   03/09/17 2223 03/09/17 2225 03/10/17 0428 03/10/17 0833  BP: 110/66  96/69   Pulse: 82  86 (!) 105  Resp: 18  18 18   Temp: 98 F (36.7 C)  98.1 F (36.7 C)   TempSrc: Oral  Oral   SpO2: 91% 94% 100% 96%  Weight:   189 lb (85.7 kg)   Height:        Intake/Output Summary (Last 24 hours) at 03/10/17 0839 Last data filed at 03/10/17 0600  Gross per 24 hour  Intake              250 ml  Output             4850 ml  Net            -4600 ml   Filed Weights   03/07/17 0627 03/09/17 0500 03/10/17 0428  Weight: 217 lb 12.8 oz (98.8 kg) 216 lb 11.4 oz (98.3 kg) 189 lb (85.7 kg)    Telemetry    A fib with rates up to 110 at times, mostly 90-105 - Personally Reviewed  ECG    No new - Personally Reviewed  Physical Exam   GEN: No acute distress.   Neck: No JVD Cardiac: irreg irreg, no murmurs, rubs, or gallops.    Respiratory: Crackles Rt base otherwise clear.  GI: Soft, nontender, non-distended  MS: trace edema No deformity. Neuro:  Nonfocal  Psych: Normal affect   Labs    Chemistry Recent Labs Lab 03/04/17 0540  03/05/17 0537  03/07/17 0339 03/08/17 0323 03/09/17 0333  NA 134*  < > 133*  < > 139 139 138  K 2.8*  < > 3.8  < > 4.6 4.3 4.4  CL 97*  < > 97*  < > 99* 96* 92*  CO2 21*  < > 25  < > 31 34* 35*  GLUCOSE 390*  < > 472*  < > 216* 129* 54*  BUN 45*  < > 45*  < > 52* 49* 42*  CREATININE 1.52*  < > 1.60*  < > 1.58* 1.36* 1.33*  CALCIUM 8.9  < > 9.1  < > 9.4 9.4 9.6  PROT 5.8*  --  6.2*  --   --  6.5  --   ALBUMIN 3.0*  --  3.2*  --   --  3.4*  --   AST 519*  --  195*  --   --  71*  --   ALT 1,135*  --  848*  --   --  466*  --   ALKPHOS 89  --  101  --   --  73  --   BILITOT 2.3*  --  2.1*  --   --  2.0*  --   GFRNONAA 47*  < > 44*  < > 45* 54* 55*  GFRAA 55*  < > 51*  < > 52* >60 >60  ANIONGAP 16*  < > 11  < > 9 9 11   < > = values in this interval not displayed.   Hematology  Recent Labs Lab 03/07/17 0339 03/08/17 0323 03/09/17 0333  WBC 9.6 5.9 8.0  RBC 2.58* 3.49* 3.83*  HGB 8.3* 10.9* 12.4*  HCT 25.5* 34.3* 38.0*  MCV 98.8 98.3 99.2  MCH 32.2 31.2 32.4  MCHC 32.5 31.8 32.6  RDW 21.5* 21.4* 21.1*  PLT 101* 76* 84*    Cardiac EnzymesNo results for input(s): TROPONINI in the last 168 hours. No results for input(s): TROPIPOC in the last 168 hours.   BNPNo results for input(s): BNP, PROBNP in the last 168 hours.   DDimer No results for input(s): DDIMER in the last 168 hours.   Radiology    No results found.  Cardiac Studies   03/02/17 ECHO Study Conclusions - Left ventricle: The cavity size was moderately dilated. Wall thickness was increased in a pattern of mild LVH. Systolic function was severely reduced. The estimated ejection fraction was in the range of 20% to 25%. Diffuse hypokinesis. The study is not technically sufficient to allow  evaluation of LV diastolicfunction. - Mitral valve: There was mild regurgitation. - Left atrium: The atrium was moderately dilated. - Right ventricle: The cavity size was mildly dilated. Wall thickness was normal. - Right atrium: The atrium was mildly dilated. - Pericardium, extracardiac: A trivial pericardial effusion was identified along the right ventricular free wall. There was no evidence of hemodynamic compromise. There was a left pleural effusion. Impressions: - When compared to prior echo, EF is markedly reduced.  10/22/16 Echo Study Conclusions - Left ventricle: The cavity size was normal. Wall thickness was normal. The estimated ejection fraction was 55%. Basal inferior and basal inferoseptal severe hypokinesis. Doppler parameters are consistent with abnormal left ventricular relaxation (grade 1 diastolic dysfunction). - Aortic valve: There was no stenosis. - Aorta: Borderline dilated aortic root. Aortic root dimension: 36 mm (ED). - Mitral valve: There was no significant regurgitation. - Left atrium: The atrium was mildly dilated. - Right ventricle: The cavity size was normal. Systolic function was normal. - Pulmonary arteries: No complete TR doppler jet so unable to estimate PA systolic pressure. - Inferior vena cava: The vessel was normal in size. The respirophasic diameter changes were in the normal range (>= 50%), consistent with normal central venous pressure. Impressions: - Normal LV size with EF 50%. Basal inferior and inferoseptal severe hypokinesis. Normal RV size and systolic function. No significant valvular abnormalities  Patient Profile     66 y.o. male with metastatic small cell lung cancer admitted with pneumonia and found to have atrial fibrillation with rapid ventricular response and acute systolic and diastolic heart failure  Assessment & Plan    Persistent atrial fib- [name] was noted to have a LAA  thrombus on TEE 8/3 and we were unable to  cardiovert him.  Rate better-controlled with the addition of digoxin on 8/4. Given his systolic dysfunction he is not on a calcium channel blocker. Continue metoprolol and amiodarone.   Acute systolic and diastolic HF.   New drop in EF to 20-25% this admission most likely due to a fib. He diuresed 6.7L  last 48 hrs with IV Lasix   Cardiomyopathy with EF 20-25% -decreased from 50% in March 2018 hx of coronary calcification on CTA of chest in Feb 2018 --known PAD -- possible ischemic eval at some point, would wait till he is otherwise more medically stable  Large Lt pleural effusion - s/p thoracentesis 07/31  Small cell lung carcinoma with mets-per IM/Onc   PNA on ABX. Per CCM  Elevated LFTs- on admission-1448/1380, down to 195/848  On 8/2.   now 71/466 per IM  Plan: Awaiting labs today but pt appears stable. I have arrnanged for OP f/u next week in [place]. Plan for OP DCCV once he has been anticoagulated x 3-4 weeks.   Will review DC meds with Dr Lyndle Herrlich may need to cut back Dig/Aldactone/ Amiodarone depending on today's labs.    Angelena Form, PA-C  03/10/2017, 8:39 AM

## 2017-03-10 NOTE — Anesthesia Preprocedure Evaluation (Signed)
Anesthesia Evaluation  Patient identified by MRN, date of birth, ID band Patient awake    Reviewed: Allergy & Precautions, H&P , NPO status , Patient's Chart, lab work & pertinent test results  History of Anesthesia Complications Negative for: history of anesthetic complications  Airway Mallampati: II  TM Distance: >3 FB Neck ROM: full    Dental  (+) Dental Advisory Given   Pulmonary COPD, former smoker,  Lung mass   breath sounds clear to auscultation       Cardiovascular hypertension, + Peripheral Vascular Disease and +CHF  + dysrhythmias Atrial Fibrillation  Rhythm:Irregular Rate:Normal     Neuro/Psych CVA    GI/Hepatic GERD  ,  Endo/Other  diabetes, Type 2obese  Renal/GU Renal InsufficiencyRenal disease     Musculoskeletal  (+) Arthritis ,   Abdominal   Peds  Hematology  (+) anemia ,   Anesthesia Other Findings   Reproductive/Obstetrics                             Anesthesia Physical Anesthesia Plan  ASA: III  Anesthesia Plan: MAC   Post-op Pain Management:    Induction: Intravenous  PONV Risk Score and Plan: 1  Airway Management Planned: Nasal Cannula  Additional Equipment: None  Intra-op Plan:   Post-operative Plan:   Informed Consent: I have reviewed the patients History and Physical, chart, labs and discussed the procedure including the risks, benefits and alternatives for the proposed anesthesia with the patient or authorized representative who has indicated his/her understanding and acceptance.   Dental advisory given  Plan Discussed with: CRNA and Surgeon  Anesthesia Plan Comments:         Anesthesia Quick Evaluation

## 2017-03-10 NOTE — Discharge Summary (Addendum)
Physician Discharge Summary  Jesse Avila SHF:[id number] DOB: 02-25-1951 DOA: 03/01/2017  PCP: [name], MD  Admit date: 03/01/2017 Discharge date: 03/10/2017  Admitted From: Home  Disposition:  Home   Recommendations for Outpatient Follow-up:  1. Follow up with PCP in 1-2 weeks 2. Please obtain BMP/CBC in one week 3. Patient needs repeat ECHO in one month.  4. Follow with his primary oncologist.     Discharge Condition; Stable.  CODE STATUS: Full code.  Diet recommendation: Heart Healthy  Brief/Interim Summary: Jesse Avila is an 66 y.o. male  with past medical history small cell cancer, status post chemotherapy and radiation presents with complaint of shortness of breath was found to have postobstructive pneumonia with pleural effusion and acute on chronic systolic heart failure in A. fib with RVR.  Acute respiratory failure with hypoxia (HCC) Multifactorial likely due acute systolic heart failure with EF 20% and postobstructive pneumonia with large pleural effusion bilaterally: Started empirically on IV steroids and IV vancomycin and IV Zosyn . Received 5 days of IV antibiotics. Tapering steroids.  Pleural fluid culture; no growth.  It seems like his on some relief after thoracentesis. Cardiology was consulted and started diuresing.  He was treated initially with  lasix to 80 Mg IV BID. Subsequently change to oral 80 mg BID. He will be discharge on 40 mg BID>  PCCM was consulted recommended thoracocentesis which was performed analysis shows a transudate by lytes criteria. Cardiology rec ischemic eval as an outpatient. Urine out put 4 L yesterday. Dyspnea improving  Plan to be discharge on lasix 40 mg BID, Spironolactone, digoxin.   A. fib with RVR: Cardiology was consulted and she was started on IV diltiazem echo shows severe systolic dysfunction, Cardizem was DC'd and was transitioned to oral amiodarone. CHA2DS2-VASc score 5, on Apixaban. For Cardioversion on 03/06/2017.  TEE showed Thrombus in LA appendage. Cardioversion was cancelled.  Continue with Eliquis.  Started on digoxin. Plan to discharge patient on digoxin and amiodarone.  Thrombus of left atrial appendage on eliquis.   Elevated LFTs: AST < ALT in the 1000's As an outpatient there were normal, currently trending down. Etiology unclear question medications versus congestive hepatopathy. LFT trending down. Hold statins at discharge.  Hepatitis panel negative,  [place]; no acute finding.   Acute kidney injury: Baseline creatinine is around 1. Has remained stable at 1.5-1.6 Cont IV diuresis, follow renal function while on diuretics.  Renal function improving. Stable at 1.4.    Elevated troponins: Likely due to demand ischemia, he denies any chest pain, no changes in EKG.  Left lung small cell carcinoma: Initially plan was for prophylactic radiation, pulmonary  recommended to hold off, as we are starting amiodarone. Patient is undergoing brain radiation.   Uncontrolled diabetes mellitus type 2: A1c is 8.5, we will DC Actos and metformin. Hypoglycemia, will hold lantus. SSI> , hold meals coverage,.  Adjust insulin depending on blood sugar.  Patient will be discharge on 10 units of lantus. Will hold Actos  and metformin at discharge due to AKI and Transaminases.     Discharge Diagnoses:  Principal Problem:   Acute respiratory failure with hypoxia (HCC) Active Problems:   Diabetes mellitus, type 2 (HCC)   COPD (chronic obstructive pulmonary disease) (HCC)   Small cell lung carcinoma, left (HCC)   Atrial fibrillation with rapid ventricular response (HCC)   Acute on chronic diastolic CHF (congestive heart failure) (HCC)   Postobstructive pneumonia   Lactic acidosis   Pleural effusion on left  Acute systolic CHF (congestive heart failure) (HCC)   Acute on chronic congestive heart failure (HCC)   AKI (acute kidney injury) ([place])   HCAP (healthcare-associated pneumonia)    Transaminitis   Thrombus of left atrial appendage     Discharge Instructions  Discharge Instructions    Diet - low sodium heart healthy    Complete by:  As directed    Diet - low sodium heart healthy    Complete by:  As directed    Increase activity slowly    Complete by:  As directed    Increase activity slowly    Complete by:  As directed      Allergies as of 03/10/2017      Reactions   Cialis [tadalafil] Other (See Comments)   Reaction:  Headache       Medication List    STOP taking these medications   amoxicillin 500 MG capsule Commonly known as:  AMOXIL   diltiazem 120 MG 24 hr capsule Commonly known as:  CARDIZEM CD   fenofibrate 160 MG tablet   glimepiride 4 MG tablet Commonly known as:  AMARYL   metFORMIN 500 MG 24 hr tablet Commonly known as:  GLUCOPHAGE-XR   oxyCODONE-acetaminophen 5-325 MG tablet Commonly known as:  PERCOCET/ROXICET   pioglitazone 15 MG tablet Commonly known as:  ACTOS   rosuvastatin 40 MG tablet Commonly known as:  CRESTOR     TAKE these medications   ACCU-CHEK NANO SMARTVIEW w/Device Kit by Does not apply route.   amiodarone 200 MG tablet Commonly known as:  PACERONE Take 1 tablet (200 mg total) by mouth 2 (two) times daily.   apixaban 5 MG Tabs tablet Commonly known as:  ELIQUIS Take 1 tablet (5 mg total) by mouth 2 (two) times daily.   digoxin 0.25 MG tablet Commonly known as:  LANOXIN Take 1 tablet (0.25 mg total) by mouth daily.   famotidine 10 MG tablet Commonly known as:  PEPCID Take 20 mg by mouth daily as needed for heartburn or indigestion.   furosemide 40 MG tablet Commonly known as:  LASIX Take 1 tablet (40 mg total) by mouth 2 (two) times daily.   guaiFENesin 100 MG/5ML liquid Commonly known as:  ROBITUSSIN Take 200 mg by mouth 3 (three) times daily as needed for cough.   insulin glargine 100 UNIT/ML injection Commonly known as:  LANTUS Inject 0.1 mLs (10 Units total) into the skin daily.    ipratropium 0.02 % nebulizer solution Commonly known as:  ATROVENT Take 2.5 mLs (0.5 mg total) by nebulization 2 (two) times daily.   levalbuterol 1.25 MG/0.5ML nebulizer solution Commonly known as:  XOPENEX Take 1.25 mg by nebulization 2 (two) times daily as needed for wheezing or shortness of breath.   LORazepam 1 MG tablet Commonly known as:  ATIVAN Take 1 tablet (1 mg total) by mouth every 12 (twelve) hours as needed for anxiety.   metoprolol succinate 100 MG 24 hr tablet Commonly known as:  TOPROL-XL Take 1 tablet (100 mg total) by mouth 2 (two) times daily. Take with or immediately following a meal. What changed:  when to take this  additional instructions   nystatin 100000 UNIT/ML suspension Commonly known as:  MYCOSTATIN Take 5 mLs (500,000 Units total) by mouth 4 (four) times daily.   ondansetron 4 MG tablet Commonly known as:  ZOFRAN Take 4 mg by mouth every 8 (eight) hours as needed for nausea or vomiting.   potassium chloride SA 20 MEQ tablet Commonly  known as:  K-DUR,KLOR-CON Take 1 tablet (20 mEq total) by mouth daily.   predniSONE 20 MG tablet Commonly known as:  DELTASONE Take 1 tablet (20 mg total) by mouth daily with breakfast.   SONAFINE Apply 1 application topically 2 (two) times daily.   spironolactone 25 MG tablet Commonly known as:  ALDACTONE Take 1 tablet (25 mg total) by mouth daily.   zolpidem 10 MG tablet Commonly known as:  AMBIEN Take 10 mg by mouth at bedtime as needed for sleep.            Durable Medical Equipment        Start     Ordered   03/10/17 1102  For home use only DME Nebulizer machine  Once    Question:  Patient needs a nebulizer to treat with the following condition  Answer:  Bronchospasm   03/10/17 1102     Follow-up Information    Minna Merritts, MD Follow up on 03/16/2017.   Specialty:  Cardiology Why:  You will see Ignacia Bayley NP 3pm Contact information: [place]  [address] [phone]        Curt Bears, MD Follow up in 3 week(s).   Specialty:  Oncology Contact information: [address] [place]  [address] [phone]          Allergies  Allergen Reactions  . Cialis [Tadalafil] Other (See Comments)    Reaction:  Headache     Consultations:  Cardiology    Procedures/Studies: Dg Chest 2 View  Result Date: 03/01/2017 CLINICAL DATA:  Shortness of breath for 4 weeks and worsening. History of atrial fibrillation, lung cancer, diabetes, previous smoker EXAM: CHEST  2 VIEW COMPARISON:  CT chest 02/06/2017.  Chest 02/02/2017 FINDINGS: Shallow inspiration. Cardiac enlargement. Increasing opacities on the left likely representing a combination of known left hilar mass and developing consolidation or postobstructive change in the left lung. Probable small left pleural effusion. Right lung is grossly clear and expanded. Power port type central venous catheter with tip over the cavoatrial junction. No pneumothorax. Calcification of the aorta. IMPRESSION: Increasing opacities in the left lung likely representing combination of known left hilar mass with developing consolidation or postobstructive change in the left lung. Probable small left pleural effusion. Electronically Signed   By: Lucienne Capers M.D.   On: 03/01/2017 02:48   Mr Jeri Cos JI Contrast  Result Date: 02/24/2017 CLINICAL DATA:  Small cell lung cancer.  Assessment for metastases. EXAM: MRI HEAD WITHOUT AND WITH CONTRAST TECHNIQUE: Multiplanar, multiecho pulse sequences of the brain and surrounding structures were obtained without and with intravenous contrast. CONTRAST:  61m MULTIHANCE GADOBENATE DIMEGLUMINE 529 MG/ML IV SOLN COMPARISON:  Brain MRI 09/26/2016 FINDINGS: Brain: The midline structures are normal. There is no focal diffusion restriction to indicate acute infarct. 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 There are old right caudate head and centrum semiovale  lacunar infarcts. No intraparenchymal hematoma or chronic microhemorrhage. Brain volume is normal for age without age-advanced or lobar predominant atrophy. The dura is normal and there is no extra-axial collection. Vascular: Major intracranial arterial and venous sinus flow voids are preserved. Skull and upper cervical spine: The visualized skull base, calvarium, upper cervical spine and extracranial soft tissues are normal. Sinuses/Orbits: No fluid levels or advanced mucosal thickening. No mastoid or middle ear effusion. Normal orbits. IMPRESSION: 1. No intracranial or calvarial metastatic disease. 2. 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 No acute intracranial abnormality. Electronically Signed   By: Ulyses Jarred M.D.   On: 02/24/2017 05:03   Nm Pulmonary Perf And Vent  Result Date: 03/01/2017 CLINICAL DATA:  Shortness of breath with exertion for 4 weeks. History of left-sided cancer status post chemotherapy. Patient receiving radiation. EXAM: NUCLEAR MEDICINE VENTILATION - PERFUSION LUNG SCAN TECHNIQUE: Ventilation images were obtained in multiple projections using inhaled aerosol Tc-66mDTPA. Perfusion images were obtained in multiple projections after intravenous injection of Tc-941mAA. RADIOPHARMACEUTICALS:  33.0 mCi Technetium-9926mPA aerosol inhalation and 4.2 mCi Technetium-30m33m IV COMPARISON:  None. FINDINGS: Ventilation: Multiple small ventilation defects in the right lung. Multiple large ventilation defects in the left lung, correlating with recent CT and plain film findings. Perfusion: No significant perfusion abnormalities in the right lung. Large perfusion defects on the left match the ventilation abnormalities. IMPRESSION: Multiple matched defects in the left lung correlate with x-ray findings. As a result, this is an intermediate probability V/Q scan. Electronically Signed   By: DaviDorise Bullion M.D   On: 03/01/2017 20:20   Dg Chest  Port 1 View  Result Date: 03/03/2017 CLINICAL DATA:  Left thoracentesis.  Left pleural effusion. EXAM: PORTABLE CHEST 1 VIEW COMPARISON:  03/01/2017 FINDINGS: No pneumothorax after left thoracentesis. Decreased density at the left lung base with improved aeration at the left lung base. Progressive hazy infiltrate in the left upper lung zone. Right lung is now clear. Power port in good position. Overall heart size and pulmonary vascularity are within normal limits considering the AP portable technique. IMPRESSION: 1. No pneumothorax after left thoracentesis. 2. Improved aeration at the left lung base. 3. Progressive infiltrate in the left upper lobe. Electronically Signed   By: JameLorriane Shire.   On: 03/03/2017 10:59   Us [place] Limited Ruq  Result Date: 03/02/2017 CLINICAL DATA:  Transaminitis EXAM: ULTRASOUND ABDOMEN LIMITED RIGHT UPPER QUADRANT COMPARISON:  03/01/2017 FINDINGS: Gallbladder: No gallstones or wall thickening visualized. No sonographic Murphy sign noted by sonographer. Common bile duct: Diameter: 2.4 mm Liver: No focal lesion identified. Within normal limits in parenchymal echogenicity. IMPRESSION: 1. No acute findings identified. Electronically Signed   By: TaylKerby Moors.   On: 03/02/2017 16:29    Subjective: Patient is feeling better. Denies dyspnea. Denies chest pain.   Discharge Exam: Vitals:   03/10/17 0428 03/10/17 0833  BP: 96/69   Pulse: 86 (!) 105  Resp: 18 18  Temp: 98.1 F (36.7 C)    Vitals:   03/09/17 2223 03/09/17 2225 03/10/17 0428 03/10/17 0833  BP: 110/66  96/69   Pulse: 82  86 (!) 105  Resp: _0 Temp: 98 F (36.7 C)  98.1 F (36.7 C)   TempSrc: Oral  Oral   SpO2: 91% 94% 100% 96%  Weight:   85.7 kg (189 lb)   Height:        General: Pt is alert, awake, not in acute distress Cardiovascular: RRR, S1/S2 +, no rubs, no gallops Respiratory: CTA bilaterally, no wheezing, no rhonchi Abdominal: Soft, NT, ND, bowel sounds + Extremities:  no edema, no cyanosis    The results of significant diagnostics from this hospitalization (including imaging, microbiology, ancillary and laboratory) are listed below for reference.     Microbiology: Recent Results (from the past 240 hour(s))  Culture, blood (routine x 2) Call MD if unable to obtain prior to antibiotics being given     Status: None   Collection Time: 03/01/17  5:30  AM  Result Value Ref Range Status   Specimen Description BLOOD BLOOD RIGHT FOREARM  Final   Special Requests IN PEDIATRIC BOTTLE Blood Culture adequate volume  Final   Culture   Final    NO GROWTH 5 DAYS Performed at [facility], [place] [address], [place], [place] [address]    Report Status 03/06/2017 FINAL  Final  Culture, blood (routine x 2) Call MD if unable to obtain prior to antibiotics being given     Status: None   Collection Time: 03/01/17  5:39 AM  Result Value Ref Range Status   Specimen Description BLOOD BLOOD LEFT FOREARM  Final   Special Requests IN PEDIATRIC BOTTLE Blood Culture adequate volume  Final   Culture   Final    NO GROWTH 5 DAYS Performed at [facility], [place] [address] [place], [place] [address]    Report Status 03/06/2017 FINAL  Final  MRSA PCR Screening     Status: None   Collection Time: 03/01/17  2:51 PM  Result Value Ref Range Status   MRSA by PCR NEGATIVE NEGATIVE Final    Comment:        The GeneXpert MRSA Assay (FDA approved for NASAL specimens only), is one component of a comprehensive MRSA colonization surveillance program. It is not intended to diagnose MRSA infection nor to guide or monitor treatment for MRSA infections.   Culture, sputum-assessment     Status: None   Collection Time: 03/02/17  7:51 AM  Result Value Ref Range Status   Specimen Description EXPECTORATED SPUTUM  Final   Special Requests NONE  Final   Sputum evaluation THIS SPECIMEN IS ACCEPTABLE FOR SPUTUM CULTURE  Final   Report Status 03/02/2017 FINAL  Final  Culture,  respiratory (NON-Expectorated)     Status: Abnormal   Collection Time: 03/02/17  7:51 AM  Result Value Ref Range Status   Specimen Description EXPECTORATED SPUTUM  Final   Special Requests NONE Reflexed from [id number]  Final   Gram Stain   Final    NO WBC SEEN FEW BUDDING YEAST SEEN RARE GRAM NEGATIVE RODS RARE GRAM POSITIVE RODS FEW SQUAMOUS EPITHELIAL CELLS PRESENT Performed at [facility], [address] [place], [place] [address]    Culture MULTIPLE ORGANISMS PRESENT, NONE PREDOMINANT (A)  Final   Report Status 03/04/2017 FINAL  Final  Body fluid culture     Status: None   Collection Time: 03/03/17 10:28 AM  Result Value Ref Range Status   Specimen Description PLEURAL LEFT  Final   Special Requests Normal  Final   Gram Stain   Final    RARE WBC PRESENT,BOTH PMN AND MONONUCLEAR NO ORGANISMS SEEN    Culture   Final    NO GROWTH Performed at [facility], [place] [address] [place], [place] [address]    Report Status 03/06/2017 FINAL  Final     Labs: BNP (last 3 results)  Recent Labs  02/02/17 1309 03/01/17 0134  BNP 452.2* 761.9*   Basic Metabolic Panel:  Recent Labs Lab 03/05/17 0537  03/06/17 1818 03/07/17 0339 03/08/17 0323 03/09/17 0333 03/10/17 0849  NA 133*  --  138 139 139 138 137  K 3.8  --  4.2 4.6 4.3 4.4 4.2  CL 97*  --  99* 99* 96* 92* 94*  CO2 25  --  28 31 34* 35* 36*  GLUCOSE 472*  --  226* 216* 129* 54* 204*  BUN 45*  --  50* 52* 49* 42* 40*  CREATININE 1.60*  < > 1.54* 1.58* 1.36* 1.33* 1.46*  CALCIUM 9.1  --  9.5 9.4 9.4 9.6 9.1  MG 2.2  --   --   --  2.1  --  2.1  < > = values in this interval not displayed. Liver Function Tests:  Recent Labs Lab 03/04/17 0540 03/05/17 0537 03/08/17 0323  AST 519* 195* 71*  ALT 1,135* 848* 466*  ALKPHOS 89 101 73  BILITOT 2.3* 2.1* 2.0*  PROT 5.8* 6.2* 6.5  ALBUMIN 3.0* 3.2* 3.4*   No results for input(s): LIPASE, AMYLASE in the last 168 hours. No results for input(s): AMMONIA in  the last 168 hours. CBC:  Recent Labs Lab 03/04/17 0540 03/05/17 0537 03/07/17 0339 03/08/17 0323 03/09/17 0333  WBC 9.8 9.8 9.6 5.9 8.0  NEUTROABS 9.2* 9.1*  --   --   --   HGB 9.3* 9.8* 8.3* 10.9* 12.4*  HCT 28.2* 29.4* 25.5* 34.3* 38.0*  MCV 97.6 97.7 98.8 98.3 99.2  PLT 92* 94* 101* 76* 84*   Cardiac Enzymes: No results for input(s): CKTOTAL, CKMB, CKMBINDEX, TROPONINI in the last 168 hours. BNP: Invalid input(s): POCBNP CBG:  Recent Labs Lab 03/09/17 1233 03/09/17 1711 03/09/17 2225 03/10/17 0738 03/10/17 1150  GLUCAP 108* 200* 249* 188* 162*   D-Dimer No results for input(s): DDIMER in the last 72 hours. Hgb A1c No results for input(s): HGBA1C in the last 72 hours. Lipid Profile No results for input(s): CHOL, HDL, LDLCALC, TRIG, CHOLHDL, LDLDIRECT in the last 72 hours. Thyroid function studies No results for input(s): TSH, T4TOTAL, T3FREE, THYROIDAB in the last 72 hours.  Invalid input(s): FREET3 Anemia work up No results for input(s): VITAMINB12, FOLATE, FERRITIN, TIBC, IRON, RETICCTPCT in the last 72 hours. Urinalysis    Component Value Date/Time   COLORURINE AMBER (A) 03/01/2017 0452   APPEARANCEUR HAZY (A) 03/01/2017 0452   LABSPEC 1.015 03/01/2017 0452   PHURINE 5.0 03/01/2017 0452   GLUCOSEU NEGATIVE 03/01/2017 0452   HGBUR SMALL (A) 03/01/2017 0452   BILIRUBINUR NEGATIVE 03/01/2017 0452   KETONESUR NEGATIVE 03/01/2017 0452   PROTEINUR 100 (A) 03/01/2017 0452   UROBILINOGEN 0.2 05/15/2015 0900   NITRITE NEGATIVE 03/01/2017 0452   LEUKOCYTESUR NEGATIVE 03/01/2017 0452   Sepsis Labs Invalid input(s): PROCALCITONIN,  WBC,  LACTICIDVEN Microbiology Recent Results (from the past 240 hour(s))  Culture, blood (routine x 2) Call MD if unable to obtain prior to antibiotics being given     Status: None   Collection Time: 03/01/17  5:30 AM  Result Value Ref Range Status   Specimen Description BLOOD BLOOD RIGHT FOREARM  Final   Special Requests IN  PEDIATRIC BOTTLE Blood Culture adequate volume  Final   Culture   Final    NO GROWTH 5 DAYS Performed at [facility], [place] [address] [place], [place] [address]    Report Status 03/06/2017 FINAL  Final  Culture, blood (routine x 2) Call MD if unable to obtain prior to antibiotics being given     Status: None   Collection Time: 03/01/17  5:39 AM  Result Value Ref Range Status   Specimen Description BLOOD BLOOD LEFT FOREARM  Final   Special Requests IN PEDIATRIC BOTTLE Blood Culture adequate volume  Final   Culture   Final    NO GROWTH 5 DAYS Performed at [facility], [place] [address], [place], [place] [address]    Report Status 03/06/2017 FINAL  Final  MRSA PCR Screening     Status: None   Collection Time: 03/01/17  2:51 PM  Result Value Ref Range Status   MRSA by PCR NEGATIVE NEGATIVE Final    Comment:        The GeneXpert MRSA Assay (FDA approved for NASAL specimens only), is one component of a comprehensive MRSA colonization surveillance program. It is not intended to diagnose MRSA infection nor to guide or monitor treatment for MRSA infections.   Culture, sputum-assessment     Status: None   Collection Time: 03/02/17  7:51 AM  Result Value Ref Range Status   Specimen Description EXPECTORATED SPUTUM  Final   Special Requests NONE  Final   Sputum evaluation THIS SPECIMEN IS ACCEPTABLE FOR SPUTUM CULTURE  Final   Report Status 03/02/2017 FINAL  Final  Culture, respiratory (NON-Expectorated)     Status: Abnormal   Collection Time: 03/02/17  7:51 AM  Result Value Ref Range Status   Specimen Description EXPECTORATED SPUTUM  Final   Special Requests NONE Reflexed from [id number]  Final   Gram Stain   Final    NO WBC SEEN FEW BUDDING YEAST SEEN RARE GRAM NEGATIVE RODS RARE GRAM POSITIVE RODS FEW SQUAMOUS EPITHELIAL CELLS PRESENT Performed at [facility], [address] [place], [place] [address]    Culture MULTIPLE ORGANISMS PRESENT, NONE PREDOMINANT (A)   Final   Report Status 03/04/2017 FINAL  Final  Body fluid culture     Status: None   Collection Time: 03/03/17 10:28 AM  Result Value Ref Range Status   Specimen Description PLEURAL LEFT  Final   Special Requests Normal  Final   Gram Stain   Final    RARE WBC PRESENT,BOTH PMN AND MONONUCLEAR NO ORGANISMS SEEN    Culture   Final    NO GROWTH Performed at [facility], [place] [address] [place], [place] [address]    Report Status 03/06/2017 FINAL  Final     Time coordinating discharge: Over 30 minutes  SIGNED:   Elmarie Shiley, MD  [facility] 03/10/2017, 2:26 PM Pager   If 7PM-7AM, please contact night-coverage [email or web address] Password [email or web address]

## 2017-03-10 NOTE — Telephone Encounter (Signed)
Patient currently admitted at this time. 

## 2017-03-10 NOTE — Progress Notes (Signed)
Spoke with floor nurse [place] [phone], patient still inpatient at [facility].  She stated expected discharge today.  Patient's a fib is now rate controlled with a rate of 70s.  Patient is coming down for radiation treatment to [facility] [facility].

## 2017-03-10 NOTE — Progress Notes (Signed)
There are no HH needs at present time.

## 2017-03-10 NOTE — Progress Notes (Signed)
Pt here for patient teaching.  Pt given Radiation and You booklet and Sonafine.  Reviewed areas of pertinence such as hair loss, nausea and vomiting, skin changes and headache . Pt able to give teach back of to pat skin and use unscented/gentle soap,apply Sonafine bid and avoid applying anything to skin within 4 hours of treatment. Pt demonstrated understanding and verbalizes understanding of information given and will contact nursing with any questions or concerns.

## 2017-03-10 NOTE — Anesthesia Postprocedure Evaluation (Signed)
Anesthesia Post Note  Patient: Jesse Avila  Procedure(s) Performed: Procedure(s) (LRB): TRANSESOPHAGEAL ECHOCARDIOGRAM (TEE) (N/A)     Patient location during evaluation: Endoscopy Anesthesia Type: MAC Level of consciousness: awake and alert Pain management: pain level controlled Vital Signs Assessment: post-procedure vital signs reviewed and stable Respiratory status: spontaneous breathing, nonlabored ventilation, respiratory function stable and patient connected to nasal cannula oxygen Cardiovascular status: stable and blood pressure returned to baseline Anesthetic complications: no    Last Vitals:  Vitals:   03/10/17 0428 03/10/17 0833  BP: 96/69   Pulse: 86 (!) 105  Resp: 18 18  Temp: 36.7 C     Last Pain:  Vitals:   03/10/17 0428  TempSrc: Oral  PainSc:                  [name]

## 2017-03-11 ENCOUNTER — Ambulatory Visit
Admission: RE | Admit: 2017-03-11 | Discharge: 2017-03-11 | Disposition: A | Discharge status: Home / Self Care | Payer: BLUE CROSS/BLUE SHIELD | Source: Ambulatory Visit | Attending: Radiation Oncology | Admitting: Radiation Oncology

## 2017-03-11 DIAGNOSIS — Z7901 Long term (current) use of anticoagulants: Secondary | ICD-10-CM | POA: Diagnosis not present

## 2017-03-11 DIAGNOSIS — C3492 Malignant neoplasm of unspecified part of left bronchus or lung: Secondary | ICD-10-CM | POA: Diagnosis not present

## 2017-03-11 DIAGNOSIS — Z510 Encounter for antineoplastic radiation therapy: Secondary | ICD-10-CM | POA: Diagnosis present

## 2017-03-11 DIAGNOSIS — Z7984 Long term (current) use of oral hypoglycemic drugs: Secondary | ICD-10-CM | POA: Diagnosis not present

## 2017-03-11 DIAGNOSIS — Z888 Allergy status to other drugs, medicaments and biological substances status: Secondary | ICD-10-CM | POA: Diagnosis not present

## 2017-03-11 DIAGNOSIS — Z79899 Other long term (current) drug therapy: Secondary | ICD-10-CM | POA: Diagnosis not present

## 2017-03-11 NOTE — Telephone Encounter (Signed)
Patient contacted regarding discharge from [facility] on 03/10/17.  Patient understands to follow up with provider Ignacia Bayley NP on 03/16/17 at 3:00 PM at [facility]. Patient understands discharge instructions? Yes Patient understands medications and regiment? Yes Patient understands to bring all medications to this visit? Yes  Reviewed all information with patient and he had questions about nebulizer machine that was ordered for him. Let him know that I would send message to his PCP about machine to see if they can get this ordered for him. He was appreciative for the call and had no further questions at this time. Reviewed with him where we are located and our phone number if he should have any further questions.

## 2017-03-12 ENCOUNTER — Ambulatory Visit
Admission: RE | Admit: 2017-03-12 | Discharge: 2017-03-12 | Disposition: A | Discharge status: Home / Self Care | Payer: BLUE CROSS/BLUE SHIELD | Source: Ambulatory Visit | Attending: Radiation Oncology | Admitting: Radiation Oncology

## 2017-03-12 DIAGNOSIS — Z510 Encounter for antineoplastic radiation therapy: Secondary | ICD-10-CM | POA: Diagnosis not present

## 2017-03-13 ENCOUNTER — Ambulatory Visit
Admission: RE | Admit: 2017-03-13 | Discharge: 2017-03-13 | Disposition: A | Discharge status: Home / Self Care | Payer: BLUE CROSS/BLUE SHIELD | Source: Ambulatory Visit | Attending: Radiation Oncology | Admitting: Radiation Oncology

## 2017-03-13 DIAGNOSIS — Z510 Encounter for antineoplastic radiation therapy: Secondary | ICD-10-CM | POA: Diagnosis not present

## 2017-03-16 ENCOUNTER — Ambulatory Visit
Admission: RE | Admit: 2017-03-16 | Discharge: 2017-03-16 | Disposition: A | Discharge status: Home / Self Care | Payer: BLUE CROSS/BLUE SHIELD | Source: Ambulatory Visit | Attending: Radiation Oncology | Admitting: Radiation Oncology

## 2017-03-16 ENCOUNTER — Telehealth: Payer: Self-pay | Admitting: Cardiovascular Disease

## 2017-03-16 ENCOUNTER — Ambulatory Visit: Payer: BLUE CROSS/BLUE SHIELD

## 2017-03-16 ENCOUNTER — Ambulatory Visit: Payer: BLUE CROSS/BLUE SHIELD | Admitting: Nurse Practitioner

## 2017-03-16 DIAGNOSIS — Z510 Encounter for antineoplastic radiation therapy: Secondary | ICD-10-CM | POA: Diagnosis not present

## 2017-03-16 DIAGNOSIS — I480 Paroxysmal atrial fibrillation: Secondary | ICD-10-CM

## 2017-03-16 NOTE — Telephone Encounter (Signed)
Please advise Digoxin.

## 2017-03-16 NOTE — Telephone Encounter (Signed)
I don't see that you saw him in the hospital. He wants to make sure that you are agreeable to him taking digoxin.

## 2017-03-16 NOTE — Telephone Encounter (Signed)
Seen in clinic by myself 11/2016 I would decrease dose of digoxin down to 0.125 mg daily Repeat digoxin level in 2 to 3 weeks

## 2017-03-16 NOTE — Telephone Encounter (Signed)
Pt is being told by pharmacy that the Digoxin he is prescribed.  They side effects off it makes the other medications not work as well   He is calling to make sure Dr Rockey Situ is wanting him to go on this medication   Please advise

## 2017-03-17 ENCOUNTER — Ambulatory Visit: Payer: BLUE CROSS/BLUE SHIELD

## 2017-03-17 ENCOUNTER — Ambulatory Visit
Admission: RE | Admit: 2017-03-17 | Discharge: 2017-03-17 | Disposition: A | Discharge status: Home / Self Care | Payer: BLUE CROSS/BLUE SHIELD | Source: Ambulatory Visit | Attending: Radiation Oncology | Admitting: Radiation Oncology

## 2017-03-17 DIAGNOSIS — Z510 Encounter for antineoplastic radiation therapy: Secondary | ICD-10-CM | POA: Diagnosis not present

## 2017-03-17 MED ORDER — DIGOXIN 125 MCG PO TABS: 0.1250 mg | ORAL_TABLET | Freq: Every day | ORAL | 6 refills | Status: DC

## 2017-03-17 NOTE — Telephone Encounter (Signed)
Reviewed Dr. Donivan Scull recommendations with patient and he verbalized understanding with no further questions at this time. Confirmed patients upcoming appointment information and also reviewed need for repeat labs in a couple of weeks. He was agreeable with this plan and had no further questions at this time.

## 2017-03-18 ENCOUNTER — Ambulatory Visit: Payer: BLUE CROSS/BLUE SHIELD

## 2017-03-18 ENCOUNTER — Ambulatory Visit
Admission: RE | Admit: 2017-03-18 | Discharge: 2017-03-18 | Disposition: A | Discharge status: Home / Self Care | Payer: BLUE CROSS/BLUE SHIELD | Source: Ambulatory Visit | Attending: Radiation Oncology | Admitting: Radiation Oncology

## 2017-03-18 DIAGNOSIS — Z510 Encounter for antineoplastic radiation therapy: Secondary | ICD-10-CM | POA: Diagnosis not present

## 2017-03-19 ENCOUNTER — Ambulatory Visit
Admission: RE | Admit: 2017-03-19 | Discharge: 2017-03-19 | Disposition: A | Discharge status: Home / Self Care | Payer: BLUE CROSS/BLUE SHIELD | Source: Ambulatory Visit | Attending: Radiation Oncology | Admitting: Radiation Oncology

## 2017-03-19 DIAGNOSIS — Z510 Encounter for antineoplastic radiation therapy: Secondary | ICD-10-CM | POA: Diagnosis not present

## 2017-03-20 ENCOUNTER — Ambulatory Visit
Admission: RE | Admit: 2017-03-20 | Discharge: 2017-03-20 | Disposition: A | Discharge status: Home / Self Care | Payer: BLUE CROSS/BLUE SHIELD | Source: Ambulatory Visit | Attending: Radiation Oncology | Admitting: Radiation Oncology

## 2017-03-20 DIAGNOSIS — Z510 Encounter for antineoplastic radiation therapy: Secondary | ICD-10-CM | POA: Diagnosis not present

## 2017-03-20 DIAGNOSIS — C3401 Malignant neoplasm of right main bronchus: Secondary | ICD-10-CM

## 2017-03-22 NOTE — Progress Notes (Signed)
Cardiology Office Note  Date:  03/23/2017   ID:  Jesse Avila, DOB 12/04/1950, MRN [id number]  PCP:  Ehinger, Robert, MD   Chief Complaint  Patient presents with  . other    1 week s/p hospital follow up from A-Fib. Meds reviewed by the pt's med list. "doing well."     HPI:  66-year-old male With history of PAD, Carotid dz, HTN,  hyperlipidemia,  DM II, Hemoglobin A1c 8.5 Smoking hx, Atrial fibrillation, CHADSVASC score of 5.seen in hospital 10/2016, converted to NSR Started on eliquis, metoprolol and diltiazem Hx of CVA, mild residual left-sided weakness COPD He presents for follow-up of his recent hospitalization and new diagnosis of atrial fibrillation  Hospitalized in [place] for atrial fibrillation discharge date 03/10/2017 Hospital records reviewed with the patient in detail Acute respiratory failure with hypoxia secondary to acute systolic CHF ejection fraction 20% post obstructive pneumonia with large left pleural effusion He had thoracentesis, 1.5 L out Placed on antibiotics Thrombus in the left atrium on TEE, cardioversion was canceled Continued on anticoagulation, started on digoxin  Currently undergoing brain radiation for left lung small cell carcinoma  All done with XRT  EF dropped from >55% in 10/2016, EF dropped  25%  Today feels weak, denies significant shortness of breath, no lower extremity edema Tolerating his medications  EKG personally reviewed by myself on todays visit Shows atrial fibrillation with ventricular rate 80 bpm ST abnormality anterolateral leads   hospitalization March 2018 seen by cardiology for new atrial fibrillation Started on diltiazem infusion and converted to sinus rhythm  small cell lung cancer 09/02/16   large left upper lobe lung mass with mediastinal invasion, pancreatic lesion and suspicious left iliac bone metastasis diagnosed in February 2018.  Undergoing Systemic chemotherapy with carbo platinum, etoposide,  radiation  to the large lung mass (done with XRT)  CT chest: Coronary, aortic arch, and branch vessel atherosclerotic vascular disease. high-grade narrowing of the proximal superficial femoral arteries bilaterally.  echo3/21/18 Normal LV size with EF 50%. Basal inferior and inferoseptal   severe hypokinesis. Normal RV size and systolic function. No   significant valvular abnormalities.  Since discharge from the hospital he has felt tired He had 2 units of blood given for hemoglobin of 7 No check since that time in the computer system Otherwise no complaints Reports he is asymptomatic from his atrial fibrillation. Not monitoring blood pressure or heart rate at home  Lab work reviewed with him in detail showing hemoglobin A1c 8.1, creatinine 1.18, total cholesterol 178 with LDL 100 left    PMH:   has a past medical history of Atrial fibrillation (HCC); Carotid artery occlusion; DJD (degenerative joint disease) of knee; Essential hypertension, benign; Goals of care, counseling/discussion (09/18/2016); History of colon polyps; History of gout; History of radiation therapy (09/30/16-11/10/16); Hyperlipidemia; Insomnia; Joint pain; Lung cancer (HCC); Nocturia; Peripheral vascular disease (HCC); Primary localized osteoarthritis of left knee (12/26/2014); Stroke (HCC) (2015?); and 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  PSH:    Past Surgical History:  Procedure Laterality Date  . ABDOMINAL AORTAGRAM N/A 03/29/2012   Procedure: ABDOMINAL AORTAGRAM;  Surgeon: Christopher S Dickson, MD;  Location: [facility] CATH LAB;  Service: Cardiovascular;  Laterality: N/A;  . COLONOSCOPY    . ENDARTERECTOMY Right 05/09/2014   Procedure: RIGHT CAROTID ENDARTERECTOMY WITH PATCH ANGIOPLASTY;  Surgeon: Brian L Chen, MD;  Location: [facility] OR;  Service: Vascular;  Laterality: Right;  . ENDARTERECTOMY FEMORAL Right 05/17/2015     Procedure: ENDARTERECTOMY FEMORAL;   Surgeon: Angelia Mould, MD;  Location: [place];  Service: Vascular;  Laterality: Right;  . ENDOBRONCHIAL ULTRASOUND Bilateral 09/08/2016   Procedure: ENDOBRONCHIAL ULTRASOUND;  Surgeon: Rigoberto Noel, MD;  Location: [facility] ENDOSCOPY;  Service: Cardiopulmonary;  Laterality: Bilateral;  . FEMORAL-POPLITEAL BYPASS GRAFT Right 05/17/2015   Procedure: BYPASS GRAFT FEMORAL-BELOW KNEE POPLITEAL ARTERY, using gortex propaten graft 6 mm x 80 cm;  Surgeon: Angelia Mould, MD;  Location: [place];  Service: Vascular;  Laterality: Right;  . IR GENERIC HISTORICAL  09/25/2016   IR FLUORO GUIDE PORT INSERTION RIGHT 09/25/2016 Markus Daft, MD [facility]-INTERV RAD  . IR GENERIC HISTORICAL  09/25/2016   IR US GUIDE VASC ACCESS RIGHT 09/25/2016 Markus Daft, MD [facility]-INTER[facility] RAD  . KNEE ARTHROSCOPY Right 11/2009  . LOWER EXTREMITY ANGIOGRAM Bilateral 03/23/2015   Procedure: Lower Extremity Angiogram;  Surgeon: Angelia Mould, MD;  Location: [place] CV LAB;  Service: Cardiovascular;  Laterality: Bilateral;  . MENISCUS REPAIR  11/2009  . PARTIAL KNEE ARTHROPLASTY Left 12/26/2014   Procedure: UNICOMPARTMENTAL KNEE;  Surgeon: Marchia Bond, MD;  Location: [place];  Service: Orthopedics;  Laterality: Left;  . PATCH ANGIOPLASTY Right 05/17/2015   Procedure: VEIN PATCH ANGIOPLASTY ILEOFEMORAL ARTERY;  Surgeon: Angelia Mould, MD;  Location: [place];  Service: Vascular;  Laterality: Right;  . PERCUTANEOUS STENT INTERVENTION N/A 05/10/2012   Procedure: PERCUTANEOUS STENT INTERVENTION;  Surgeon: Angelia Mould, MD;  Location: [facility] CATH LAB;  Service: Cardiovascular;  Laterality: N/A;  . PERIPHERAL VASCULAR CATHETERIZATION N/A 03/23/2015   Procedure: Abdominal Aortogram;  Surgeon: Angelia Mould, MD;  Location: [place] CV LAB;  Service: Cardiovascular;  Laterality: N/A;  . STENTS     PLACED IN ??BOTH LEGS   2013?  . TEE WITHOUT CARDIOVERSION N/A 03/06/2017   Procedure: TRANSESOPHAGEAL ECHOCARDIOGRAM (TEE);  Surgeon:  Larey Dresser, MD;  Location: [facility] ENDOSCOPY;  Service: Cardiovascular;  Laterality: N/A;    Current Outpatient Prescriptions  Medication Sig Dispense Refill  . amiodarone (PACERONE) 200 MG tablet Take 1 tablet (200 mg total) by mouth 2 (two) times daily. 60 tablet 0  . apixaban (ELIQUIS) 5 MG TABS tablet Take 1 tablet (5 mg total) by mouth 2 (two) times daily. 60 tablet 3  . Blood Glucose Monitoring Suppl (ACCU-CHEK NANO SMARTVIEW) w/Device KIT by Does not apply route.    . digoxin (LANOXIN) 0.125 MG tablet Take 1 tablet (0.125 mg total) by mouth daily. 30 tablet 6  . famotidine (PEPCID) 10 MG tablet Take 20 mg by mouth daily as needed for heartburn or indigestion.    . furosemide (LASIX) 40 MG tablet Take 1 tablet (40 mg total) by mouth 2 (two) times daily. 60 tablet 0  . insulin glargine (LANTUS) 100 UNIT/ML injection Inject 0.1 mLs (10 Units total) into the skin daily. 10 mL 11  . ipratropium (ATROVENT) 0.02 % nebulizer solution Take 2.5 mLs (0.5 mg total) by nebulization 2 (two) times daily. 75 mL 12  . levalbuterol (XOPENEX) 1.25 MG/0.5ML nebulizer solution Take 1.25 mg by nebulization 2 (two) times daily as needed for wheezing or shortness of breath. 1 each 12  . LORazepam (ATIVAN) 1 MG tablet Take 1 tablet (1 mg total) by mouth every 12 (twelve) hours as needed for anxiety. 30 tablet 0  . metoprolol succinate (TOPROL-XL) 100 MG 24 hr tablet Take 100 mg by mouth daily. Take with or immediately following a meal.    . ondansetron (ZOFRAN) 4 MG tablet  Take 4 mg by mouth every 8 (eight) hours as needed for nausea or vomiting.    . potassium chloride SA (K-DUR,KLOR-CON) 20 MEQ tablet Take 1 tablet (20 mEq total) by mouth daily. 30 tablet 0  . spironolactone (ALDACTONE) 25 MG tablet Take 1 tablet (25 mg total) by mouth daily. 30 tablet 0  . Wound Dressings (SONAFINE) Apply 1 application topically 2 (two) times daily.    Marland Kitchen zolpidem (AMBIEN) 10 MG tablet Take 10 mg by mouth at bedtime as needed  for sleep.      No current facility-administered medications for this visit.    Facility-Administered Medications Ordered in Other Visits  Medication Dose Route Frequency Provider Last Rate Last Dose  . heparin lock flush 100 unit/mL  500 Units Intracatheter Once PRN Curt Bears, MD      . sodium chloride flush (NS) 0.9 % injection 10 mL  10 mL Intracatheter PRN Curt Bears, MD   10 mL at 10/13/16 1618  . sodium chloride flush (NS) 0.9 % injection 10 mL  10 mL Intracatheter PRN Curt Bears, MD      . sodium chloride flush (NS) 0.9 % injection 10 mL  10 mL Intracatheter PRN Curt Bears, MD   10 mL at 11/26/16 1544     Allergies:   Cialis [tadalafil]   Social History:  The patient  reports that he has quit smoking. He smoked 0.00 packs per day for 0.00 years. He has never used smokeless tobacco. He reports that he drinks alcohol. He reports that he does not use drugs.   Family History:   family history includes Diabetes in his mother; Heart attack in his mother; Heart disease in his mother; Hyperlipidemia in his mother and sister; Hypertension in his father and mother; Stroke in his sister.    Review of Systems: Review of Systems  Constitutional: Positive for malaise/fatigue.  Respiratory: Negative.   Cardiovascular: Negative.   Gastrointestinal: Negative.   Musculoskeletal: Negative.   Neurological: Negative.   Psychiatric/Behavioral: Negative.   All other systems reviewed and are negative.    PHYSICAL EXAM: VS:  BP 110/60 (BP Location: Left Arm, Patient Position: Sitting, Cuff Size: Normal)   Pulse 86   Ht 6' 1" (1.854 m)   Wt 187 lb 4 oz (84.9 kg)   BMI 24.70 kg/m  , BMI Body mass index is 24.7 kg/m. GEN: Well nourished, well developed, in no acute distress  HEENT: normal  Neck: no JVD, carotid bruits, or masses Cardiac: irregularly irregular,no murmurs, rubs, or gallops,no edema  Respiratory:  clear to auscultation bilaterally, normal work of  breathing GI: soft, nontender, nondistended, + BS MS: no deformity or atrophy  Skin: warm and dry, no rash Neuro:  Strength and sensation are intact Psych: euthymic mood, full affect    Recent Labs: 10/21/2016: TSH 0.234 03/01/2017: B Natriuretic Peptide 503.1 03/08/2017: ALT 466 03/09/2017: Hemoglobin 12.4; Platelets 84 03/10/2017: BUN 40; Creatinine, Ser 1.46; Magnesium 2.1; Potassium 4.2; Sodium 137    Lipid Panel Lab Results  Component Value Date   CHOL 121 10/22/2016   HDL 24 (L) 10/22/2016   LDLCALC 53 10/22/2016   TRIG 219 (H) 10/22/2016      Wt Readings from Last 3 Encounters:  03/23/17 187 lb 4 oz (84.9 kg)  03/10/17 189 lb (85.7 kg)  02/19/17 220 lb 12.8 oz (100.2 kg)       ASSESSMENT AND PLAN:  Essential hypertension, benign - Plan: EKG 12-Lead Blood pressure is well controlled on today's  visit. No changes made to the medications.  Peripheral vascular disease (HCC) - Plan: EKG 12-Lead History of lower extremity arterial stenting  Mixed hyperlipidemia - Plan: EKG 12-Lead Currently not on a statin, will discuss with him in follow-up  Paroxysmal atrial fibrillation (HCC) - Plan: EKG 12-Lead Persistent atrial fibrillation, Thrombus in the left atrial appendage Reevaluate ejection fraction by echocardiogram 4-6 weeks Been discussed with him whether he would like to restore normal sinus rhythm Would need TEE prior to heart eversion  Type 2 diabetes mellitus with other circulatory complication, unspecified long term insulin use status (HCC) - Plan: EKG 12-Lead Reports he is done with chemotherapy, has had significant weight loss recently  Chronic obstructive pulmonary disease, unspecified COPD type (HCC) - Plan: EKG 12-Lead Stable, denies any significant shortness of breath  Small cell lung carcinoma, left (HCC) - Plan: EKG 12-Lead Finished radiation per the patient, completed chemotherapy Surveillance imaging in the future  Malignant neoplasm of hilus of  left lung (HCC) - Plan: EKG 12-Lead Details as above  Anemia, unspecified type Previous history of anemia requiring transfusion    Total encounter time more than 45 minutes  Greater than 50% was spent in counseling and coordination of care with the patient   Disposition:   F/U  3 months   No orders of the defined types were placed in this encounter.    Signed, Tim Gollan, M.D., Ph.D. 03/23/2017  [facility], [place] [phone]  

## 2017-03-23 ENCOUNTER — Encounter: Payer: Self-pay | Admitting: Cardiovascular Disease

## 2017-03-23 ENCOUNTER — Ambulatory Visit (INDEPENDENT_AMBULATORY_CARE_PROVIDER_SITE_OTHER): Payer: BLUE CROSS/BLUE SHIELD | Admitting: Cardiovascular Disease

## 2017-03-23 VITALS — BP 110/60 | HR 86 | Ht 73.0 in | Wt 187.2 lb

## 2017-03-23 DIAGNOSIS — I480 Paroxysmal atrial fibrillation: Secondary | ICD-10-CM

## 2017-03-23 DIAGNOSIS — I5033 Acute on chronic diastolic (congestive) heart failure: Secondary | ICD-10-CM | POA: Diagnosis not present

## 2017-03-23 DIAGNOSIS — C3492 Malignant neoplasm of unspecified part of left bronchus or lung: Secondary | ICD-10-CM | POA: Diagnosis not present

## 2017-03-23 DIAGNOSIS — E1159 Type 2 diabetes mellitus with other circulatory complications: Secondary | ICD-10-CM | POA: Diagnosis not present

## 2017-03-23 DIAGNOSIS — I10 Essential (primary) hypertension: Secondary | ICD-10-CM

## 2017-03-23 DIAGNOSIS — I429 Cardiomyopathy, unspecified: Secondary | ICD-10-CM | POA: Diagnosis not present

## 2017-03-23 DIAGNOSIS — I739 Peripheral vascular disease, unspecified: Secondary | ICD-10-CM | POA: Diagnosis not present

## 2017-03-23 DIAGNOSIS — J441 Chronic obstructive pulmonary disease with (acute) exacerbation: Secondary | ICD-10-CM

## 2017-03-23 MED ORDER — ENALAPRIL MALEATE 2.5 MG PO TABS: 2.5000 mg | ORAL_TABLET | Freq: Every day | ORAL | 3 refills | Status: DC

## 2017-03-23 MED ORDER — FUROSEMIDE 40 MG PO TABS: 40.0000 mg | ORAL_TABLET | Freq: Two times a day (BID) | ORAL | 6 refills | Status: DC

## 2017-03-23 NOTE — Patient Instructions (Addendum)
Medication Instructions:   Please start enalapril 2.5 mg one a day  Labwork:  No new labs needed  Testing/Procedures:  We will schedule a lexiscan [facility] MYOVIEW  Your caregiver has ordered a Stress Test with nuclear imaging. The purpose of this test is to evaluate the blood supply to your heart muscle. This procedure is referred to as a "Non-Invasive Stress Test." This is because other than having an IV started in your vein, nothing is inserted or "invades" your body. Cardiac stress tests are done to find areas of poor blood flow to the heart by determining the extent of coronary artery disease (CAD). Some patients exercise on a treadmill, which naturally increases the blood flow to your heart, while others who are  unable to walk on a treadmill due to physical limitations have a pharmacologic/chemical stress agent called Lexiscan . This medicine will mimic walking on a treadmill by temporarily increasing your coronary blood flow.   Please note: these test may take anywhere between 2-4 hours to complete  PLEASE REPORT TO [place] AT THE FIRST DESK WILL DIRECT YOU WHERE TO GO  Date of Procedure:_____________________________________  Arrival Time for Procedure:______________________________  Instructions regarding medication:   __X__ : Hold diabetes medication morning of procedure  __X__:  Hold METOPROLOL the night before and morning of procedure   How to prepare for your Myoview test:  1. Do not eat or drink after midnight 2. No caffeine for 24 hours prior to test 3. No smoking 24 hours prior to test. 4. Your medication may be taken with water.  If your doctor stopped a medication because of this test, do not take that medication. 5. Please wear a short sleeve shirt.   Echo in 6 weeks  Your physician has requested that you have an echocardiogram. Echocardiography is a painless test that uses sound waves to create images of your heart.  It provides your doctor with information about the size and shape of your heart and how well your heart's chambers and valves are working. This procedure takes approximately one hour. There are no restrictions for this procedure.   Follow-Up: It was a pleasure seeing you in the office today. Please call us if you have new issues that need to be addressed before your next appt.  [phone]  Your physician wants you to follow-up in: 2 months.    If you need a refill on your cardiac medications before your next appointment, please call your pharmacy.    Cardiac Nuclear Scan A cardiac nuclear scan is a test that measures blood flow to the heart when a person is resting and when he or she is exercising. The test looks for problems such as:  Not enough blood reaching a portion of the heart.  The heart muscle not working normally.  You may need this test if:  You have heart disease.  You have had abnormal lab results.  You have had heart surgery or angioplasty.  You have chest pain.  You have shortness of breath.  In this test, a radioactive dye (tracer) is injected into your bloodstream. After the tracer has traveled to your heart, an imaging device is used to measure how much of the tracer is absorbed by or distributed to various areas of your heart. This procedure is usually done at a hospital and takes 2-4 hours. Tell a health care provider about:  Any allergies you have.  All medicines you are taking, including vitamins, herbs,  eye drops, creams, and over-the-counter medicines.  Any problems you or family members have had with the use of anesthetic medicines.  Any blood disorders you have.  Any surgeries you have had.  Any medical conditions you have.  Whether you are pregnant or may be pregnant. What are the risks? Generally, this is a safe procedure. However, problems may occur, including:  Serious chest pain and heart attack. This is only a risk if the stress  portion of the test is done.  Rapid heartbeat.  Sensation of warmth in your chest. This usually passes quickly.  What happens before the procedure?  Ask your health care provider about changing or stopping your regular medicines. This is especially important if you are taking diabetes medicines or blood thinners.  Remove your jewelry on the day of the procedure. What happens during the procedure?  An IV tube will be inserted into one of your veins.  Your health care provider will inject a small amount of radioactive tracer through the tube.  You will wait for 20-40 minutes while the tracer travels through your bloodstream.  Your heart activity will be monitored with an electrocardiogram (ECG).  You will lie down on an exam table.  Images of your heart will be taken for about 15-20 minutes.  You may be asked to exercise on a treadmill or stationary bike. While you exercise, your heart's activity will be monitored with an ECG, and your blood pressure will be checked. If you are unable to exercise, you may be given a medicine to increase blood flow to parts of your heart.  When blood flow to your heart has peaked, a tracer will again be injected through the IV tube.  After 20-40 minutes, you will get back on the exam table and have more images taken of your heart.  When the procedure is over, your IV tube will be removed. The procedure may vary among health care providers and hospitals. Depending on the type of tracer used, scans may need to be repeated 3-4 hours later. What happens after the procedure?  Unless your health care provider tells you otherwise, you may return to your normal schedule, including diet, activities, and medicines.  Unless your health care provider tells you otherwise, you may increase your fluid intake. This will help flush the contrast dye from your body. Drink enough fluid to keep your urine clear or pale yellow.  It is up to you to get your test  results. Ask your health care provider, or the department that is doing the test, when your results will be ready. Summary  A cardiac nuclear scan measures the blood flow to the heart when a person is resting and when he or she is exercising.  You may need this test if you are at risk for heart disease.  Tell your health care provider if you are pregnant.  Unless your health care provider tells you otherwise, increase your fluid intake. This will help flush the contrast dye from your body. Drink enough fluid to keep your urine clear or pale yellow. This information is not intended to replace advice given to you by your health care provider. Make sure you discuss any questions you have with your health care provider. Document Released: 08/15/2004 Document Revised: 07/23/2016 Document Reviewed: 06/29/2013 Elsevier Interactive Patient Education  2017 [employer]. Echocardiogram An echocardiogram, or echocardiography, uses sound waves (ultrasound) to produce an image of your heart. The echocardiogram is simple, painless, obtained within a short period of time,  and offers valuable information to your health care provider. The images from an echocardiogram can provide information such as:  Evidence of coronary artery disease (CAD).  Heart size.  Heart muscle function.  Heart valve function.  Aneurysm detection.  Evidence of a past heart attack.  Fluid buildup around the heart.  Heart muscle thickening.  Assess heart valve function.  Tell a health care provider about:  Any allergies you have.  All medicines you are taking, including vitamins, herbs, eye drops, creams, and over-the-counter medicines.  Any problems you or family members have had with anesthetic medicines.  Any blood disorders you have.  Any surgeries you have had.  Any medical conditions you have.  Whether you are pregnant or may be pregnant. What happens before the procedure? No special preparation is  needed. Eat and drink normally. What happens during the procedure?  In order to produce an image of your heart, gel will be applied to your chest and a wand-like tool (transducer) will be moved over your chest. The gel will help transmit the sound waves from the transducer. The sound waves will harmlessly bounce off your heart to allow the heart images to be captured in real-time motion. These images will then be recorded.  You may need an IV to receive a medicine that improves the quality of the pictures. What happens after the procedure? You may return to your normal schedule including diet, activities, and medicines, unless your health care provider tells you otherwise. This information is not intended to replace advice given to you by your health care provider. Make sure you discuss any questions you have with your health care provider. Document Released: 07/18/2000 Document Revised: 03/08/2016 Document Reviewed: 03/28/2013 Elsevier Interactive Patient Education  2017 [employer].

## 2017-03-24 ENCOUNTER — Telehealth: Payer: Self-pay | Admitting: Cardiovascular Disease

## 2017-03-24 NOTE — Telephone Encounter (Signed)
Spoke w/ pt.  Advised him that Dr. Rockey Situ will not be in the hospital on 8/31, but offered him several potential dates to do his lexiscan. He will check w/ his ride and call back to let me know what day(s) he can schedule this.

## 2017-03-24 NOTE — Telephone Encounter (Signed)
[name] sched for 04/01/17 @ 9:30, pt verbalizes understanding to arrive @ [place] @ 9:15 and follow written instructions provided to him yesterday.  Asked him to call back w/ any further questions or concerns.

## 2017-03-24 NOTE — Telephone Encounter (Signed)
Patient calling to let us know that 8/31 is a good day for him to have the test Dr. Rockey Situ ordered.  Please call to set up.

## 2017-04-01 ENCOUNTER — Encounter
Admission: RE | Admit: 2017-04-01 | Discharge: 2017-04-01 | Disposition: A | Discharge status: Home / Self Care | Payer: BLUE CROSS/BLUE SHIELD | Source: Ambulatory Visit | Attending: Cardiovascular Disease | Admitting: Cardiovascular Disease

## 2017-04-01 DIAGNOSIS — I10 Essential (primary) hypertension: Secondary | ICD-10-CM | POA: Diagnosis not present

## 2017-04-01 DIAGNOSIS — I429 Cardiomyopathy, unspecified: Secondary | ICD-10-CM | POA: Diagnosis present

## 2017-04-01 DIAGNOSIS — I5033 Acute on chronic diastolic (congestive) heart failure: Secondary | ICD-10-CM | POA: Diagnosis present

## 2017-04-01 DIAGNOSIS — I739 Peripheral vascular disease, unspecified: Secondary | ICD-10-CM

## 2017-04-01 DIAGNOSIS — J441 Chronic obstructive pulmonary disease with (acute) exacerbation: Secondary | ICD-10-CM

## 2017-04-01 DIAGNOSIS — I480 Paroxysmal atrial fibrillation: Secondary | ICD-10-CM

## 2017-04-01 LAB — NM MYOCAR MULTI W/SPECT W/WALL MOTION / EF
CHL CUP NUCLEAR SDS: 2
CHL CUP STRESS STAGE 1 GRADE: 0 %
CHL CUP STRESS STAGE 1 HR: 60 {beats}/min
CHL CUP STRESS STAGE 1 SPEED: 0 mph
CHL CUP STRESS STAGE 2 GRADE: 0 %
CHL CUP STRESS STAGE 2 HR: 60 {beats}/min
CHL CUP STRESS STAGE 4 DBP: 56 mmHg
CHL CUP STRESS STAGE 4 GRADE: 0 %
CHL CUP STRESS STAGE 4 HR: 72 {beats}/min
CHL CUP STRESS STAGE 4 SBP: 99 mmHg
CHL CUP STRESS STAGE 4 SPEED: 0 mph
Estimated workload: 1 METS
LV sys vol: 70 mL
LVDIAVOL: 139 mL (ref 62–150)
NUC STRESS TID: 1.07
Peak HR: 68 {beats}/min
Percent HR: 62 %
Percent of predicted max HR: 43 %
Rest HR: 62 {beats}/min
SRS: 0
SSS: 1
Stage 2 Speed: 0 mph
Stage 3 Grade: 0 %
Stage 3 HR: 68 {beats}/min
Stage 3 Speed: 0 mph

## 2017-04-01 MED ORDER — TECHNETIUM TC 99M TETROFOSMIN IV KIT
29.6140 | PACK | Freq: Once | INTRAVENOUS | Status: AC | PRN
  Administered 2017-04-01: 29.614 via INTRAVENOUS

## 2017-04-01 MED ORDER — REGADENOSON 0.4 MG/5ML IV SOLN
0.4000 mg | Freq: Once | INTRAVENOUS | Status: AC
  Administered 2017-04-01: 0.4 mg via INTRAVENOUS

## 2017-04-01 MED ORDER — TECHNETIUM TC 99M TETROFOSMIN IV KIT
13.0000 | PACK | Freq: Once | INTRAVENOUS | Status: AC | PRN
  Administered 2017-04-01: 13.142 via INTRAVENOUS

## 2017-04-02 ENCOUNTER — Telehealth: Payer: Self-pay | Admitting: Cardiovascular Disease

## 2017-04-02 NOTE — Telephone Encounter (Signed)
Pt states he gets light headed after he takes his medications. He thinks he does not get light headed when he 1st gets up, it is not until he takes his medication.

## 2017-04-02 NOTE — Telephone Encounter (Signed)
Spoke w/ pt.  He reports that he feels dizzy on changing positions. He has not checked his BP while symptomatic. Advised him that b/c of his meds, he will need to change positions slowly. Advised him to start monitoring his BP, esp when symptomatic and call back w/ readings in case we need to adjust his meds.  He is agreeable and appreciative of the call.

## 2017-04-03 ENCOUNTER — Other Ambulatory Visit: Payer: Self-pay | Admitting: Cardiovascular Disease

## 2017-04-03 DIAGNOSIS — I5033 Acute on chronic diastolic (congestive) heart failure: Secondary | ICD-10-CM

## 2017-04-03 DIAGNOSIS — I480 Paroxysmal atrial fibrillation: Secondary | ICD-10-CM

## 2017-04-03 DIAGNOSIS — J441 Chronic obstructive pulmonary disease with (acute) exacerbation: Secondary | ICD-10-CM

## 2017-04-03 DIAGNOSIS — I429 Cardiomyopathy, unspecified: Secondary | ICD-10-CM

## 2017-04-10 ENCOUNTER — Ambulatory Visit (INDEPENDENT_AMBULATORY_CARE_PROVIDER_SITE_OTHER): Payer: BLUE CROSS/BLUE SHIELD

## 2017-04-10 ENCOUNTER — Other Ambulatory Visit: Payer: Self-pay

## 2017-04-10 DIAGNOSIS — I429 Cardiomyopathy, unspecified: Secondary | ICD-10-CM | POA: Diagnosis not present

## 2017-04-10 DIAGNOSIS — I480 Paroxysmal atrial fibrillation: Secondary | ICD-10-CM | POA: Diagnosis not present

## 2017-04-10 DIAGNOSIS — I5033 Acute on chronic diastolic (congestive) heart failure: Secondary | ICD-10-CM | POA: Diagnosis not present

## 2017-04-10 DIAGNOSIS — J441 Chronic obstructive pulmonary disease with (acute) exacerbation: Secondary | ICD-10-CM

## 2017-04-13 ENCOUNTER — Encounter (HOSPITAL_COMMUNITY): Payer: Self-pay

## 2017-04-13 ENCOUNTER — Telehealth: Payer: Self-pay | Admitting: Oncology

## 2017-04-13 ENCOUNTER — Ambulatory Visit (HOSPITAL_COMMUNITY)
Admission: RE | Admit: 2017-04-13 | Discharge: 2017-04-13 | Disposition: A | Discharge status: Home / Self Care | Payer: BLUE CROSS/BLUE SHIELD | Source: Ambulatory Visit | Attending: Internal Medicine | Admitting: Internal Medicine

## 2017-04-13 ENCOUNTER — Other Ambulatory Visit (HOSPITAL_BASED_OUTPATIENT_CLINIC_OR_DEPARTMENT_OTHER): Payer: BLUE CROSS/BLUE SHIELD

## 2017-04-13 DIAGNOSIS — I700 Atherosclerosis of aorta: Secondary | ICD-10-CM | POA: Insufficient documentation

## 2017-04-13 DIAGNOSIS — I2510 Atherosclerotic heart disease of native coronary artery without angina pectoris: Secondary | ICD-10-CM | POA: Diagnosis not present

## 2017-04-13 DIAGNOSIS — Z9889 Other specified postprocedural states: Secondary | ICD-10-CM | POA: Diagnosis present

## 2017-04-13 DIAGNOSIS — C3412 Malignant neoplasm of upper lobe, left bronchus or lung: Secondary | ICD-10-CM | POA: Diagnosis not present

## 2017-04-13 DIAGNOSIS — K7460 Unspecified cirrhosis of liver: Secondary | ICD-10-CM | POA: Insufficient documentation

## 2017-04-13 DIAGNOSIS — Z5111 Encounter for antineoplastic chemotherapy: Secondary | ICD-10-CM

## 2017-04-13 DIAGNOSIS — C3492 Malignant neoplasm of unspecified part of left bronchus or lung: Secondary | ICD-10-CM | POA: Diagnosis present

## 2017-04-13 DIAGNOSIS — J90 Pleural effusion, not elsewhere classified: Secondary | ICD-10-CM | POA: Diagnosis not present

## 2017-04-13 LAB — CBC WITH DIFFERENTIAL/PLATELET
BASO%: 0.2 % (ref 0.0–2.0)
BASOS ABS: 0 10*3/uL (ref 0.0–0.1)
EOS%: 1.2 % (ref 0.0–7.0)
Eosinophils Absolute: 0.1 10*3/uL (ref 0.0–0.5)
HEMATOCRIT: 29.6 % — AB (ref 38.4–49.9)
HGB: 9.7 g/dL — ABNORMAL LOW (ref 13.0–17.1)
LYMPH#: 1.3 10*3/uL (ref 0.9–3.3)
LYMPH%: 29.5 % (ref 14.0–49.0)
MCH: 32.8 pg (ref 27.2–33.4)
MCHC: 32.8 g/dL (ref 32.0–36.0)
MCV: 100 fL — ABNORMAL HIGH (ref 79.3–98.0)
MONO#: 0.6 10*3/uL (ref 0.1–0.9)
MONO%: 13 % (ref 0.0–14.0)
NEUT#: 2.4 10*3/uL (ref 1.5–6.5)
NEUT%: 56.1 % (ref 39.0–75.0)
PLATELETS: 93 10*3/uL — AB (ref 140–400)
RBC: 2.96 10*6/uL — AB (ref 4.20–5.82)
RDW: 17.9 % — ABNORMAL HIGH (ref 11.0–14.6)
WBC: 4.3 10*3/uL (ref 4.0–10.3)

## 2017-04-13 LAB — COMPREHENSIVE METABOLIC PANEL
ALT: 13 U/L (ref 0–55)
ANION GAP: 12 meq/L — AB (ref 3–11)
AST: 13 U/L (ref 5–34)
Albumin: 3.4 g/dL — ABNORMAL LOW (ref 3.5–5.0)
Alkaline Phosphatase: 103 U/L (ref 40–150)
BUN: 16.9 mg/dL (ref 7.0–26.0)
CALCIUM: 9.7 mg/dL (ref 8.4–10.4)
CHLORIDE: 98 meq/L (ref 98–109)
CO2: 26 meq/L (ref 22–29)
Creatinine: 1.4 mg/dL — ABNORMAL HIGH (ref 0.7–1.3)
EGFR: 55 mL/min/{1.73_m2} — AB (ref >90)
Glucose: 208 mg/dl — ABNORMAL HIGH (ref 70–140)
Potassium: 4.6 mEq/L (ref 3.5–5.1)
Sodium: 135 mEq/L — ABNORMAL LOW (ref 136–145)
Total Bilirubin: 0.93 mg/dL (ref 0.20–1.20)
Total Protein: 7.1 g/dL (ref 6.4–8.3)

## 2017-04-13 MED ORDER — IOPAMIDOL (ISOVUE-300) INJECTION 61%
INTRAVENOUS | Status: AC
Start: 2017-04-13 — End: 2017-04-13
  Filled 2017-04-13: qty 100

## 2017-04-13 MED ORDER — IOPAMIDOL (ISOVUE-300) INJECTION 61%
100.0000 mL | Freq: Once | INTRAVENOUS | Status: AC | PRN
  Administered 2017-04-13: 100 mL via INTRAVENOUS

## 2017-04-15 ENCOUNTER — Telehealth: Payer: Self-pay | Admitting: Internal Medicine

## 2017-04-15 ENCOUNTER — Encounter: Payer: Self-pay | Admitting: Internal Medicine

## 2017-04-15 ENCOUNTER — Ambulatory Visit (HOSPITAL_BASED_OUTPATIENT_CLINIC_OR_DEPARTMENT_OTHER): Payer: BLUE CROSS/BLUE SHIELD | Admitting: Internal Medicine

## 2017-04-15 VITALS — BP 105/68 | HR 97 | Temp 98.2°F | Resp 18 | Ht 73.0 in | Wt 187.7 lb

## 2017-04-15 DIAGNOSIS — C3412 Malignant neoplasm of upper lobe, left bronchus or lung: Secondary | ICD-10-CM

## 2017-04-15 DIAGNOSIS — D61810 Antineoplastic chemotherapy induced pancytopenia: Secondary | ICD-10-CM | POA: Diagnosis not present

## 2017-04-15 DIAGNOSIS — C3492 Malignant neoplasm of unspecified part of left bronchus or lung: Secondary | ICD-10-CM

## 2017-04-15 DIAGNOSIS — C7951 Secondary malignant neoplasm of bone: Secondary | ICD-10-CM | POA: Diagnosis not present

## 2017-04-15 NOTE — Progress Notes (Signed)
[place] [phone]   [phone]  OFFICE PROGRESS NOTE  Jesse Arabian, MD [address] [place] [address]  DIAGNOSIS: Extensive stage (T4, N2, M1b) small cell lung cancer presented with large left upper lobe lung mass with mediastinal invasion as well as pancreatic lesion and suspicious left iliac bone metastasis diagnosed in February 2018.  PRIOR THERAPY: 1) Systemic chemotherapy with carboplatin for AUC of 5 on day 1 and etoposide 100 MG/M2 on days 1, 2 and 3 with Neulasta support status post 6 cycles. This is concurrent with radiotherapy. This also concurrent with radiation to the large lung mass. 2) prophylactic cranial irradiation under the care of Dr. Sondra Come.  CURRENT THERAPY: Observation.   INTERVAL HISTORY: Jesse Avila 66 y.o. male returns to the clinic today for follow-up visit accompanied by his wife. The patient is currently on observation and feeling fine. He denied having any significant chest pain, shortness of breath, cough or hemoptysis. He denied having any fever or chills. He has no nausea, vomiting, diarrhea or constipation. He completed prophylactic cranial irradiation to the brain The patient denied having any headache or visual changes. He had repeat CT scan of the chest, abdomen and pelvis performed recently and he is here for evaluation and discussion of his scan results.   MEDICAL HISTORY: Past Medical History:  Diagnosis Date  . Atrial fibrillation ([place])   . Carotid artery occlusion   . DJD (degenerative joint disease) of knee   . Essential hypertension, benign    takes Benicar daily  . Goals of care, counseling/discussion 09/18/2016  . History of colon polyps    benign  . History of gout   . History of radiation therapy 09/30/16-11/10/16   left lung 60 Gy in 30 fractions  . Hyperlipidemia    takes Fenofibrate and Crestor daily  . Insomnia    takes Ambien nightly as needed  . Joint pain    . Lung cancer ([place])   . Nocturia   . Peripheral vascular disease ([place])   . Primary localized osteoarthritis of left knee 12/26/2014  . Stroke [facility]) 2015?   takes Plavix daily as well as Pletal,not on plavix at present 05/15/15  . 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  MEDICATIONS:  Current Outpatient Prescriptions  Medication Sig Dispense Refill  . amiodarone (PACERONE) 200 MG tablet Take 1 tablet (200 mg total) by mouth 2 (two) times daily. 60 tablet 0  . apixaban (ELIQUIS) 5 MG TABS tablet Take 1 tablet (5 mg total) by mouth 2 (two) times daily. 60 tablet 3  . Blood Glucose Monitoring Suppl (ACCU-CHEK NANO SMARTVIEW) w/Device KIT by Does not apply route.    . digoxin (LANOXIN) 0.125 MG tablet Take 1 tablet (0.125 mg total) by mouth daily. 30 tablet 6  . enalapril (VASOTEC) 2.5 MG tablet Take 1 tablet (2.5 mg total) by mouth daily. 90 tablet 3  . famotidine (PEPCID) 10 MG tablet Take 20 mg by mouth daily as needed for heartburn or indigestion.    . furosemide (LASIX) 40 MG tablet Take 1 tablet (40 mg total) by mouth 2 (two) times daily. 60 tablet 6  . insulin glargine (LANTUS) 100 UNIT/ML injection Inject 0.1 mLs (10 Units total) into the skin daily. 10 mL 11  . LORazepam (ATIVAN) 1  MG tablet Take 1 tablet (1 mg total) by mouth every 12 (twelve) hours as needed for anxiety. 30 tablet 0  . metoprolol succinate (TOPROL-XL) 100 MG 24 hr tablet Take 100 mg by mouth daily. Take with or immediately following a meal.    . potassium chloride SA (K-DUR,KLOR-CON) 20 MEQ tablet Take 1 tablet (20 mEq total) by mouth daily. 30 tablet 0  . spironolactone (ALDACTONE) 25 MG tablet Take 1 tablet (25 mg total) by mouth daily. 30 tablet 0  . zolpidem (AMBIEN) 10 MG tablet Take 10 mg by mouth at bedtime as needed for sleep.     Marland Kitchen ipratropium  (ATROVENT) 0.02 % nebulizer solution Take 2.5 mLs (0.5 mg total) by nebulization 2 (two) times daily. (Patient not taking: Reported on 04/15/2017) 75 mL 12  . levalbuterol (XOPENEX) 1.25 MG/0.5ML nebulizer solution Take 1.25 mg by nebulization 2 (two) times daily as needed for wheezing or shortness of breath. (Patient not taking: Reported on 04/15/2017) 1 each 12  . ondansetron (ZOFRAN) 4 MG tablet Take 4 mg by mouth every 8 (eight) hours as needed for nausea or vomiting.     No current facility-administered medications for this visit.    Facility-Administered Medications Ordered in Other Visits  Medication Dose Route Frequency Provider Last Rate Last Dose  . heparin lock flush 100 unit/mL  500 Units Intracatheter Once PRN Curt Bears, MD      . sodium chloride flush (NS) 0.9 % injection 10 mL  10 mL Intracatheter PRN Curt Bears, MD   10 mL at 10/13/16 1618  . sodium chloride flush (NS) 0.9 % injection 10 mL  10 mL Intracatheter PRN Curt Bears, MD      . sodium chloride flush (NS) 0.9 % injection 10 mL  10 mL Intracatheter PRN Curt Bears, MD   10 mL at 11/26/16 1544    SURGICAL HISTORY:  Past Surgical History:  Procedure Laterality Date  . ABDOMINAL AORTAGRAM N/A 03/29/2012   Procedure: ABDOMINAL Maxcine Ham;  Surgeon: Angelia Mould, MD;  Location: [facility] CATH LAB;  Service: Cardiovascular;  Laterality: N/A;  . COLONOSCOPY    . ENDARTERECTOMY Right 05/09/2014   Procedure: RIGHT CAROTID ENDARTERECTOMY WITH PATCH ANGIOPLASTY;  Surgeon: Conrad , MD;  Location: [place];  Service: Vascular;  Laterality: Right;  . ENDARTERECTOMY FEMORAL Right 05/17/2015   Procedure: ENDARTERECTOMY FEMORAL;  Surgeon: Angelia Mould, MD;  Location: [place];  Service: Vascular;  Laterality: Right;  . ENDOBRONCHIAL ULTRASOUND Bilateral 09/08/2016   Procedure: ENDOBRONCHIAL ULTRASOUND;  Surgeon: Rigoberto Noel, MD;  Location: [facility] ENDOSCOPY;  Service: Cardiopulmonary;  Laterality: Bilateral;  .  FEMORAL-POPLITEAL BYPASS GRAFT Right 05/17/2015   Procedure: BYPASS GRAFT FEMORAL-BELOW KNEE POPLITEAL ARTERY, using gortex propaten graft 6 mm x 80 cm;  Surgeon: Angelia Mould, MD;  Location: [place];  Service: Vascular;  Laterality: Right;  . IR GENERIC HISTORICAL  09/25/2016   IR FLUORO GUIDE PORT INSERTION RIGHT 09/25/2016 Markus Daft, MD [facility]-INTERV RAD  . IR GENERIC HISTORICAL  09/25/2016   IR US GUIDE VASC ACCESS RIGHT 09/25/2016 Markus Daft, MD [facility]-INTER[facility]  . KNEE ARTHROSCOPY Right 11/2009  . LOWER EXTREMITY ANGIOGRAM Bilateral 03/23/2015   Procedure: Lower Extremity Angiogram;  Surgeon: Angelia Mould, MD;  Location: [place] CV LAB;  Service: Cardiovascular;  Laterality: Bilateral;  . MENISCUS REPAIR  11/2009  . PARTIAL KNEE ARTHROPLASTY Left 12/26/2014   Procedure: UNICOMPARTMENTAL KNEE;  Surgeon: Marchia Bond, MD;  Location: [place];  Service: Orthopedics;  Laterality: Left;  . PATCH ANGIOPLASTY Right 05/17/2015   Procedure: VEIN PATCH ANGIOPLASTY ILEOFEMORAL ARTERY;  Surgeon: Angelia Mould, MD;  Location: [place];  Service: Vascular;  Laterality: Right;  . PERCUTANEOUS STENT INTERVENTION N/A 05/10/2012   Procedure: PERCUTANEOUS STENT INTERVENTION;  Surgeon: Angelia Mould, MD;  Location: [facility] CATH LAB;  Service: Cardiovascular;  Laterality: N/A;  . PERIPHERAL VASCULAR CATHETERIZATION N/A 03/23/2015   Procedure: Abdominal Aortogram;  Surgeon: Angelia Mould, MD;  Location: [place] CV LAB;  Service: Cardiovascular;  Laterality: N/A;  . STENTS     PLACED IN ??BOTH LEGS   2013?  . TEE WITHOUT CARDIOVERSION N/A 03/06/2017   Procedure: TRANSESOPHAGEAL ECHOCARDIOGRAM (TEE);  Surgeon: Larey Dresser, MD;  Location: [facility] ENDOSCOPY;  Service: Cardiovascular;  Laterality: N/A;    REVIEW OF SYSTEMS:  A comprehensive review of systems was negative except for: Constitutional: positive for fatigue   PHYSICAL EXAMINATION: General appearance: alert, cooperative and no  distress Head: Normocephalic, without obvious abnormality, atraumatic Neck: no adenopathy, no JVD, supple, symmetrical, trachea midline and thyroid not enlarged, symmetric, no tenderness/mass/nodules Lymph nodes: Cervical, supraclavicular, and axillary nodes normal. Resp: clear to auscultation bilaterally Back: symmetric, no curvature. ROM normal. No CVA tenderness. Cardio: regular rate and rhythm, S1, S2 normal, no murmur, click, rub or gallop GI: soft, non-tender; bowel sounds normal; no masses,  no organomegaly Extremities: extremities normal, atraumatic, no cyanosis or edema  ECOG PERFORMANCE STATUS: 1 - Symptomatic but completely ambulatory  Blood pressure 105/68, pulse 97, temperature 98.2 F (36.8 C), temperature source Oral, resp. rate 18, height '6\' 1"'$  (1.854 m), weight 187 lb 11.2 oz (85.1 kg), SpO2 100 %.  LABORATORY DATA: Lab Results  Component Value Date   WBC 4.3 04/13/2017   HGB 9.7 (L) 04/13/2017   HCT 29.6 (L) 04/13/2017   MCV 100.0 (H) 04/13/2017   PLT 93 (L) 04/13/2017      Chemistry      Component Value Date/Time   NA 135 (L) 04/13/2017 1055   K 4.6 04/13/2017 1055   CL 94 (L) 03/10/2017 0849   CO2 26 04/13/2017 1055   BUN 16.9 04/13/2017 1055   CREATININE 1.4 (H) 04/13/2017 1055      Component Value Date/Time   CALCIUM 9.7 04/13/2017 1055   ALKPHOS 103 04/13/2017 1055   AST 13 04/13/2017 1055   ALT 13 04/13/2017 1055   BILITOT 0.93 04/13/2017 1055       RADIOGRAPHIC STUDIES: Ct Chest W Contrast  Result Date: 04/13/2017 CLINICAL DATA:  Lung cancer.  Status post chemo and XRT. EXAM: CT CHEST, ABDOMEN, AND PELVIS WITH CONTRAST TECHNIQUE: Multidetector CT imaging of the chest, abdomen and pelvis was performed following the standard protocol during bolus administration of intravenous contrast. CONTRAST:  141m ISOVUE-300 IOPAMIDOL (ISOVUE-300) INJECTION 61% COMPARISON:  02/06/2017 FINDINGS: CT CHEST FINDINGS Cardiovascular: The heart size appears within  normal limits. Aortic atherosclerosis. Calcification within the RCA and LAD and left circumflex coronary arteries. No pericardial effusion identified. Mediastinum/Nodes: AP window mass measures 3.1 x 4.2 cm, image 23 of series 2. On the previous exam this measured 3.3 x 4.4 cm. No right-sided mediastinal, sub- carinal or hilar adenopathy. Lungs/Pleura: Patchy areas of nodular consolidation and ground-glass attenuation and architectural distortion identified within the left lung. This is favored to represent changes of external beam radiation. Right lung appears clear. Musculoskeletal: No aggressive lytic or sclerotic bone lesions identified. CT ABDOMEN PELVIS FINDINGS Hepatobiliary: No focal liver abnormality is seen. Hypertrophy of the caudate  lobe of liver and lateral segment of left lobe identified. No gallstones, gallbladder wall thickening, or biliary dilatation. Pancreas: Unremarkable. No pancreatic ductal dilatation or surrounding inflammatory changes. Spleen: Normal in size without focal abnormality. Adrenals/Urinary Tract: The adrenal glands are normal. Unremarkable appearance of both kidneys. No mass or hydronephrosis. Urinary bladder is negative. Stomach/Bowel: Stomach is within normal limits. Appendix appears normal. No evidence of bowel wall thickening, distention, or inflammatory changes. Colonic diverticulosis noted. No acute inflammation. Vascular/Lymphatic: Aortic atherosclerosis. No aneurysm. No abdominal adenopathy identified. No pelvic or inguinal adenopathy. Reproductive: Prostate is unremarkable. Other: No abdominal wall hernia or abnormality. No abdominopelvic ascites. Musculoskeletal: Degenerative disc disease is identified within the lower thoracic spine. IMPRESSION: 1. Slight decrease in size of AP window mass. 2. Persistent areas of nodularity and ground-glass attenuation throughout the left lung which is favored to represent changes secondary to external beam radiation. 3. Decrease and  left pleural effusion. 4. 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 5. Mild cirrhosis 6. Aortic Atherosclerosis (ICD10-I70.0). Multi vessel coronary artery calcifications noted. Electronically Signed   By: Signa Kell M.D.   On: 04/13/2017 14:57   Ct Abdomen Pelvis W Contrast  Result Date: 04/13/2017 CLINICAL DATA:  Lung cancer.  Status post chemo and XRT. EXAM: CT CHEST, ABDOMEN, AND PELVIS WITH CONTRAST TECHNIQUE: Multidetector CT imaging of the chest, abdomen and pelvis was performed following the standard protocol during bolus administration of intravenous contrast. CONTRAST:  ISOVUE-300 IOPAMIDOL (ISOVUE-300) INJECTION 61% COMPARISON:  02/06/2017 FINDINGS: CT CHEST FINDINGS Cardiovascular: The heart size appears within normal limits. Aortic atherosclerosis. Calcification within the RCA and LAD and left circumflex coronary arteries. No pericardial effusion identified. Mediastinum/Nodes: AP window mass measures 3.1 x 4.2 cm, image 23 of series 2. On the previous exam this measured 3.3 x 4.4 cm. No right-sided mediastinal, sub- carinal or hilar adenopathy. Lungs/Pleura: Patchy areas of nodular consolidation and ground-glass attenuation and architectural distortion identified within the left lung. This is favored to represent changes of external beam radiation. Right lung appears clear. Musculoskeletal: No aggressive lytic or sclerotic bone lesions identified. CT ABDOMEN PELVIS FINDINGS Hepatobiliary: No focal liver abnormality is seen. Hypertrophy of the caudate lobe of liver and lateral segment of left lobe identified. No gallstones, gallbladder wall thickening, or biliary dilatation. Pancreas: Unremarkable. No pancreatic ductal dilatation or surrounding inflammatory changes. Spleen: Normal in size without focal abnormality. Adrenals/Urinary Tract: The adrenal glands are normal. Unremarkable appearance of both kidneys. No mass or hydronephrosis. Urinary bladder is  negative. Stomach/Bowel: Stomach is within normal limits. Appendix appears normal. No evidence of bowel wall thickening, distention, or inflammatory changes. Colonic diverticulosis noted. No acute inflammation. Vascular/Lymphatic: Aortic atherosclerosis. No aneurysm. No abdominal adenopathy identified. No pelvic or inguinal adenopathy. Reproductive: Prostate is unremarkable. Other: No abdominal wall hernia or abnormality. No abdominopelvic ascites. Musculoskeletal: Degenerative disc disease is identified within the lower thoracic spine. IMPRESSION: 1. Slight decrease in size of AP window mass. 2. Persistent areas of nodularity and ground-glass attenuation throughout the left lung which is favored to represent changes secondary to external beam radiation. 3. Decrease and left pleural effusion. 4. 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 5. Mild cirrhosis 6. Aortic Atherosclerosis (ICD10-I70.0). Multi vessel coronary artery calcifications noted. Electronically Signed   By: Signa Kell M.D.   On: 04/13/2017 14:57   Nm Myocar Multi W/spect W/wall Motion / Ef  Addendum Date: 04/01/2017   Addendum: 04/01/17,  7:38 pm (please disregard earlier report below) New report: Pharmacological  myocardial perfusion imaging study with no significant  ischemia Normal wall motion, EF estimated at 30% (likely depressed secondary to GI uptake artifact) No EKG changes concerning for ischemia at peak stress or in recovery. Resting EKG with atrial fibrillation and nonspecific ST abnormality Low risk scan Signed, Esmond Plants, MD, Ph.D [facility]    Result Date: 04/01/2017 Pharmacological myocardial perfusion imaging study with no significant  Ischemia Large region of fixed defect in the apical, periapical region and  mid to distal anteroseptal wall Apical and septal wall hypokinesis, EF estimated at 57% Significant GI uptake artifact No EKG changes concerning for ischemia at peak stress or in  recovery. Resting EKG with atrial fibrillation and nonspecific ST abnormality Moderate risk scan given size of fixed defect Signed, Esmond Plants, MD, Ph.D [facility]    ASSESSMENT AND PLAN:  This is a very pleasant 66 years old white male with extensive stage small cell lung cancer completed 6 cycles of systemic chemotherapy with carboplatin and etoposide concurrent with radiation to the large left upper lobe mass.  The patient tolerated the treatment well except for the chemotherapy-induced pancytopenia and significant anemia. This was followed by prophylactic cranial irradiation. The patient is current on observation. Repeat CT scan of the chest, abdomen and pelvis showed no evidence for disease progression. I discussed the scan results with the patient and his wife and recommended for him to continue on observation with repeat CT scan of the chest, abdomen and pelvis in 2 months for restaging of his disease. The patient was advised to call immediately if he has any concerning symptoms in the interval. The patient voices understanding of current disease status and treatment options and is in agreement with the current care plan. All questions were answered. The patient knows to call the clinic with any problems, questions or concerns. We can certainly see the patient much sooner if necessary.  Disclaimer: This note was dictated with voice recognition software. Similar sounding words can inadvertently be transcribed and may not be corrected upon review.

## 2017-04-15 NOTE — Telephone Encounter (Signed)
Gave patient AVS and calendar of upcoming November appointments.  °

## 2017-04-16 ENCOUNTER — Telehealth: Payer: Self-pay | Admitting: Cardiovascular Disease

## 2017-04-16 DIAGNOSIS — I4891 Unspecified atrial fibrillation: Secondary | ICD-10-CM

## 2017-04-16 DIAGNOSIS — Z01810 Encounter for preprocedural cardiovascular examination: Secondary | ICD-10-CM

## 2017-04-16 NOTE — Telephone Encounter (Signed)
Pt returning our call for Echo results

## 2017-04-16 NOTE — Telephone Encounter (Signed)
Returned call to patient. Patient had just left the house. Patient's wife answered and she is on DPR. Results and recommendations of echo given and she verbalized understanding. She will check with patient to make sure he has not missed any Eliquis doses and let us know if he has. Cardioversion scheduled and she verbalized understanding of the following instructions. Called scheduling and left message to schedule procedure.   You are scheduled for a Cardioversion on __10/05/18______ with Dr.__Gollan____ Please arrive at the [place] [facility] at _0730_ a.m. on the day of your procedure.  DIET INSTRUCTIONS:  Nothing to eat or drink after midnight except your medications with a              sip of water.         1) Labs: __CBC, BMP, PT/INR (Pt will go the week of 04/27/17)____  2) Medications:  YOU MAY TAKE ALL of your remaining medications with a small amount of water.  3) Must have a responsible person to drive you home.  4) Bring a current list of your medications and current insurance cards.    If you have any questions after you get home, please call the office at [phone]

## 2017-04-16 NOTE — Telephone Encounter (Signed)
TEE/Cardioversion scheduled time is 0730, arrival time of 0630. Patient called and verbalized understanding to arrive at 0630.  He confirmed that he has NOT missed any doses of Eliquis.

## 2017-04-16 NOTE — Addendum Note (Signed)
Addended by: Vanessa Ralphs on: 04/16/2017 03:55 PM   Modules accepted: Orders

## 2017-04-27 ENCOUNTER — Ambulatory Visit: Payer: BLUE CROSS/BLUE SHIELD | Admitting: Radiation Oncology

## 2017-04-30 ENCOUNTER — Other Ambulatory Visit
Admission: RE | Admit: 2017-04-30 | Discharge: 2017-04-30 | Disposition: A | Discharge status: Home / Self Care | Payer: BLUE CROSS/BLUE SHIELD | Source: Ambulatory Visit | Attending: Cardiovascular Disease | Admitting: Cardiovascular Disease

## 2017-04-30 DIAGNOSIS — I4891 Unspecified atrial fibrillation: Secondary | ICD-10-CM | POA: Insufficient documentation

## 2017-04-30 DIAGNOSIS — Z01810 Encounter for preprocedural cardiovascular examination: Secondary | ICD-10-CM | POA: Insufficient documentation

## 2017-04-30 LAB — CBC WITH DIFFERENTIAL/PLATELET
BASOS ABS: 0 10*3/uL (ref 0–0.1)
BASOS PCT: 1 %
EOS ABS: 0.1 10*3/uL (ref 0–0.7)
EOS PCT: 2 %
HCT: 29.6 % — ABNORMAL LOW (ref 40.0–52.0)
HEMOGLOBIN: 10.5 g/dL — AB (ref 13.0–18.0)
LYMPHS ABS: 1.4 10*3/uL (ref 1.0–3.6)
Lymphocytes Relative: 26 %
MCH: 35.6 pg — AB (ref 26.0–34.0)
MCHC: 35.5 g/dL (ref 32.0–36.0)
MCV: 100.5 fL — ABNORMAL HIGH (ref 80.0–100.0)
Monocytes Absolute: 0.4 10*3/uL (ref 0.2–1.0)
Monocytes Relative: 7 %
NEUTROS PCT: 64 %
Neutro Abs: 3.4 10*3/uL (ref 1.4–6.5)
PLATELETS: 158 10*3/uL (ref 150–440)
RBC: 2.94 MIL/uL — AB (ref 4.40–5.90)
RDW: 19.1 % — ABNORMAL HIGH (ref 11.5–14.5)
WBC: 5.4 10*3/uL (ref 3.8–10.6)

## 2017-04-30 LAB — BASIC METABOLIC PANEL
Anion gap: 12 (ref 5–15)
BUN: 18 mg/dL (ref 6–20)
CALCIUM: 9.8 mg/dL (ref 8.9–10.3)
CHLORIDE: 95 mmol/L — AB (ref 101–111)
CO2: 27 mmol/L (ref 22–32)
CREATININE: 1.33 mg/dL — AB (ref 0.61–1.24)
GFR, EST NON AFRICAN AMERICAN: 55 mL/min — AB (ref >60)
Glucose, Bld: 280 mg/dL — ABNORMAL HIGH (ref 65–99)
Potassium: 4.4 mmol/L (ref 3.5–5.1)
SODIUM: 134 mmol/L — AB (ref 135–145)

## 2017-04-30 LAB — PROTIME-INR
INR: 1.66
PROTHROMBIN TIME: 19.5 s — AB (ref 11.4–15.2)

## 2017-05-08 ENCOUNTER — Ambulatory Visit: Payer: BLUE CROSS/BLUE SHIELD | Admitting: Anesthesiology

## 2017-05-08 ENCOUNTER — Ambulatory Visit
Admission: RE | Admit: 2017-05-08 | Discharge: 2017-05-08 | Disposition: A | Discharge status: Home / Self Care | Payer: BLUE CROSS/BLUE SHIELD | Source: Ambulatory Visit | Attending: Cardiovascular Disease | Admitting: Cardiovascular Disease

## 2017-05-08 ENCOUNTER — Encounter
Admission: RE | Disposition: A | Discharge status: Home / Self Care | Payer: Self-pay | Source: Ambulatory Visit | Attending: Cardiovascular Disease

## 2017-05-08 DIAGNOSIS — Z539 Procedure and treatment not carried out, unspecified reason: Secondary | ICD-10-CM | POA: Diagnosis not present

## 2017-05-08 DIAGNOSIS — E119 Type 2 diabetes mellitus without complications: Secondary | ICD-10-CM | POA: Insufficient documentation

## 2017-05-08 DIAGNOSIS — Z974 Presence of external hearing-aid: Secondary | ICD-10-CM | POA: Diagnosis not present

## 2017-05-08 DIAGNOSIS — I4891 Unspecified atrial fibrillation: Secondary | ICD-10-CM | POA: Insufficient documentation

## 2017-05-08 DIAGNOSIS — Z79899 Other long term (current) drug therapy: Secondary | ICD-10-CM | POA: Diagnosis not present

## 2017-05-08 DIAGNOSIS — I10 Essential (primary) hypertension: Secondary | ICD-10-CM | POA: Diagnosis not present

## 2017-05-08 DIAGNOSIS — Z87891 Personal history of nicotine dependence: Secondary | ICD-10-CM | POA: Diagnosis not present

## 2017-05-08 HISTORY — PX: TEE WITHOUT CARDIOVERSION: SHX5443

## 2017-05-08 HISTORY — PX: CARDIOVERSION: EP1203

## 2017-05-08 LAB — GLUCOSE, CAPILLARY: GLUCOSE-CAPILLARY: 293 mg/dL — AB (ref 65–99)

## 2017-05-08 SURGERY — CARDIOVERSION (CATH LAB)
Anesthesia: General

## 2017-05-08 MED ORDER — MIDAZOLAM HCL 2 MG/2ML IJ SOLN
INTRAMUSCULAR | Status: AC
  Filled 2017-05-08: qty 2

## 2017-05-08 MED ORDER — PROPOFOL 10 MG/ML IV BOLUS
INTRAVENOUS | Status: AC
  Filled 2017-05-08: qty 20

## 2017-05-08 MED ORDER — LIDOCAINE HCL (PF) 2 % IJ SOLN
INTRAMUSCULAR | Status: AC
  Filled 2017-05-08: qty 2

## 2017-05-08 NOTE — Anesthesia Preprocedure Evaluation (Signed)
Anesthesia Evaluation  Patient identified by MRN, date of birth, ID band Patient awake    Reviewed: Allergy & Precautions, NPO status , Patient's Chart, lab work & pertinent test results  History of Anesthesia Complications Negative for: history of anesthetic complications  Airway Mallampati: III       Dental   Pulmonary neg sleep apnea, neg COPD, former smoker,           Cardiovascular hypertension, Pt. on medications and Pt. on home beta blockers +CHF  + dysrhythmias Atrial Fibrillation      Neuro/Psych    GI/Hepatic Neg liver ROS, GERD  Medicated and Controlled,  Endo/Other  diabetes, Type 2, Oral Hypoglycemic Agents, Insulin Dependent  Renal/GU Renal InsufficiencyRenal disease     Musculoskeletal   Abdominal   Peds  Hematology   Anesthesia Other Findings   Reproductive/Obstetrics                             Anesthesia Physical Anesthesia Plan  ASA: III  Anesthesia Plan: General   Post-op Pain Management:    Induction: Intravenous  PONV Risk Score and Plan: 2 and Ondansetron, Dexamethasone and Propofol infusion  Airway Management Planned:   Additional Equipment:   Intra-op Plan:   Post-operative Plan:   Informed Consent: I have reviewed the patients History and Physical, chart, labs and discussed the procedure including the risks, benefits and alternatives for the proposed anesthesia with the patient or authorized representative who has indicated his/her understanding and acceptance.     Plan Discussed with:   Anesthesia Plan Comments:         Anesthesia Quick Evaluation

## 2017-05-08 NOTE — Progress Notes (Signed)
Patient presented for TEE cardioversion Initial EKG shows NSR Procedure was cancelled Will continue current medications  Signed, Esmond Plants, MD, Ph.D [facility]

## 2017-05-08 NOTE — Progress Notes (Signed)
Patient was seen by cardiology, Dr. Rockey Situ. Patient in [place] with heart rate in the 60's. Procedure cancelled.

## 2017-05-11 ENCOUNTER — Encounter: Payer: Self-pay | Admitting: Cardiovascular Disease

## 2017-05-19 ENCOUNTER — Telehealth: Payer: Self-pay | Admitting: Cardiovascular Disease

## 2017-05-19 NOTE — Telephone Encounter (Signed)
would decrease amiodarone down to once a day Follow-up in one month is okay Would monitor blood pressure and heart rateperiodically

## 2017-05-19 NOTE — Telephone Encounter (Signed)
Patient calling to make 2 w fu from cardioversion that was cancelled as patient in NSR at times of procedure   Patient scheduled for an OV is this what he needs ? Or a nurse visit for ekg ? Please advise

## 2017-05-20 NOTE — Telephone Encounter (Signed)
Left voicemail message for patient to call back.

## 2017-05-22 NOTE — Telephone Encounter (Signed)
Left voicemail message to call back  

## 2017-05-22 NOTE — Telephone Encounter (Signed)
Spoke with patient and reviewed Dr. Donivan Scull recommendations to decrease amiodarone to once daily and keep scheduled follow up appointment. Instructed him to continue monitoring his blood pressures and heart rates as well and to keep a log to bring to next appointment. He verbalized understanding of our conversation, agreement with plan, and had no further questions at this time.

## 2017-05-27 ENCOUNTER — Telehealth: Payer: Self-pay | Admitting: Cardiovascular Disease

## 2017-05-27 NOTE — Telephone Encounter (Signed)
PT wife calling States that pt has been getting dizzy and having difficulty walking  They believe it is from medication Calling to see if they can get in sooner Please call to advise

## 2017-05-27 NOTE — Telephone Encounter (Signed)
Called and s/w wife, ok per DPR. Patient has been dizzy and nauseas over the past several days. Patient went to see his PCP, Dr Marisue Humble, yesterday who did lab work and urine culture. He also discontinued one medication; however, patient and wife cannot recall which one. He was then prescribed phenergan to help with the nausea. Patient is anxious and does not want to make these changes until he sees Dr Rockey Situ and would like a more soon appointment. Patient scheduled for 06/19/17 but has a CT that day and won't be able to make it. Rescheduled appointment with Dr Rockey Situ to next available on 07/03/17 and added to wait-list. Advised patient and wife that he should follow the advice of his PCP for now for treatment and not wait until seeing Dr Rockey Situ. Patient and wife verbalized understanding.

## 2017-05-28 NOTE — Telephone Encounter (Signed)
Spoke with patients wife per release form. Let her know that we have him scheduled and on our wait list if something should come open sooner. Instructed her to please call if symptoms should persist or worsen and she verbalized understanding with no further questions at this time.

## 2017-06-01 ENCOUNTER — Encounter (HOSPITAL_COMMUNITY): Payer: Self-pay

## 2017-06-01 ENCOUNTER — Emergency Department (HOSPITAL_COMMUNITY)
Admission: EM | Admit: 2017-06-01 | Discharge: 2017-06-01 | Disposition: A | Discharge status: Home / Self Care | Payer: BLUE CROSS/BLUE SHIELD | Attending: Emergency Medicine | Admitting: Emergency Medicine

## 2017-06-01 ENCOUNTER — Emergency Department (HOSPITAL_COMMUNITY): Payer: BLUE CROSS/BLUE SHIELD

## 2017-06-01 DIAGNOSIS — Z794 Long term (current) use of insulin: Secondary | ICD-10-CM | POA: Diagnosis not present

## 2017-06-01 DIAGNOSIS — Z7901 Long term (current) use of anticoagulants: Secondary | ICD-10-CM | POA: Diagnosis not present

## 2017-06-01 DIAGNOSIS — I2510 Atherosclerotic heart disease of native coronary artery without angina pectoris: Secondary | ICD-10-CM | POA: Insufficient documentation

## 2017-06-01 DIAGNOSIS — I10 Essential (primary) hypertension: Secondary | ICD-10-CM | POA: Insufficient documentation

## 2017-06-01 DIAGNOSIS — E119 Type 2 diabetes mellitus without complications: Secondary | ICD-10-CM | POA: Diagnosis not present

## 2017-06-01 DIAGNOSIS — R112 Nausea with vomiting, unspecified: Secondary | ICD-10-CM | POA: Insufficient documentation

## 2017-06-01 DIAGNOSIS — R42 Dizziness and giddiness: Secondary | ICD-10-CM | POA: Insufficient documentation

## 2017-06-01 DIAGNOSIS — J449 Chronic obstructive pulmonary disease, unspecified: Secondary | ICD-10-CM | POA: Insufficient documentation

## 2017-06-01 DIAGNOSIS — Z87891 Personal history of nicotine dependence: Secondary | ICD-10-CM | POA: Diagnosis not present

## 2017-06-01 DIAGNOSIS — K5900 Constipation, unspecified: Secondary | ICD-10-CM | POA: Insufficient documentation

## 2017-06-01 DIAGNOSIS — Z85118 Personal history of other malignant neoplasm of bronchus and lung: Secondary | ICD-10-CM | POA: Diagnosis not present

## 2017-06-01 DIAGNOSIS — Z79899 Other long term (current) drug therapy: Secondary | ICD-10-CM | POA: Diagnosis not present

## 2017-06-01 LAB — CBC
HCT: 31.2 % — ABNORMAL LOW (ref 39.0–52.0)
Hemoglobin: 10.9 g/dL — ABNORMAL LOW (ref 13.0–17.0)
MCH: 34.1 pg — ABNORMAL HIGH (ref 26.0–34.0)
MCHC: 34.9 g/dL (ref 30.0–36.0)
MCV: 97.5 fL (ref 78.0–100.0)
Platelets: 157 10*3/uL (ref 150–400)
RBC: 3.2 MIL/uL — ABNORMAL LOW (ref 4.22–5.81)
RDW: 14.3 % (ref 11.5–15.5)
WBC: 8.1 10*3/uL (ref 4.0–10.5)

## 2017-06-01 LAB — URINALYSIS, ROUTINE W REFLEX MICROSCOPIC
Bilirubin Urine: NEGATIVE
Glucose, UA: NEGATIVE mg/dL
Hgb urine dipstick: NEGATIVE
Ketones, ur: NEGATIVE mg/dL
Leukocytes, UA: NEGATIVE
Nitrite: NEGATIVE
Protein, ur: NEGATIVE mg/dL
Specific Gravity, Urine: 1.017 (ref 1.005–1.030)
pH: 5 (ref 5.0–8.0)

## 2017-06-01 LAB — TROPONIN I

## 2017-06-01 LAB — BASIC METABOLIC PANEL
Anion gap: 14 (ref 5–15)
BUN: 23 mg/dL — ABNORMAL HIGH (ref 6–20)
CO2: 25 mmol/L (ref 22–32)
Calcium: 10 mg/dL (ref 8.9–10.3)
Chloride: 91 mmol/L — ABNORMAL LOW (ref 101–111)
Creatinine, Ser: 1.64 mg/dL — ABNORMAL HIGH (ref 0.61–1.24)
GFR calc Af Amer: 49 mL/min — ABNORMAL LOW (ref >60)
GFR calc non Af Amer: 43 mL/min — ABNORMAL LOW (ref >60)
Glucose, Bld: 209 mg/dL — ABNORMAL HIGH (ref 65–99)
Potassium: 3.9 mmol/L (ref 3.5–5.1)
Sodium: 130 mmol/L — ABNORMAL LOW (ref 135–145)

## 2017-06-01 LAB — AMMONIA: Ammonia: 21 umol/L (ref 9–35)

## 2017-06-01 LAB — HEPATIC FUNCTION PANEL
ALT: 14 U/L — AB (ref 17–63)
AST: 18 U/L (ref 15–41)
Albumin: 3.8 g/dL (ref 3.5–5.0)
Alkaline Phosphatase: 107 U/L (ref 38–126)
BILIRUBIN INDIRECT: 0.5 mg/dL (ref 0.3–0.9)
Bilirubin, Direct: 0.4 mg/dL (ref 0.1–0.5)
TOTAL PROTEIN: 7.9 g/dL (ref 6.5–8.1)
Total Bilirubin: 0.9 mg/dL (ref 0.3–1.2)

## 2017-06-01 LAB — LIPASE, BLOOD: Lipase: 25 U/L (ref 11–51)

## 2017-06-01 LAB — CBG MONITORING, ED: Glucose-Capillary: 207 mg/dL — ABNORMAL HIGH (ref 65–99)

## 2017-06-01 MED ORDER — SODIUM CHLORIDE 0.9 % IV BOLUS (SEPSIS): 1000.0000 mL | Freq: Once | INTRAVENOUS | Status: DC

## 2017-06-01 MED ORDER — IOPAMIDOL (ISOVUE-300) INJECTION 61%
INTRAVENOUS | Status: AC
  Administered 2017-06-01: 100 mL via INTRAVENOUS
  Filled 2017-06-01: qty 100

## 2017-06-01 MED ORDER — SODIUM CHLORIDE 0.9 % IV BOLUS (SEPSIS)
500.0000 mL | Freq: Once | INTRAVENOUS | Status: AC
  Administered 2017-06-01: 500 mL via INTRAVENOUS

## 2017-06-01 MED ORDER — MIRTAZAPINE 7.5 MG PO TABS: 7.5000 mg | ORAL_TABLET | Freq: Every day | ORAL | 0 refills | Status: DC

## 2017-06-01 NOTE — ED Notes (Signed)
Pt back from CT

## 2017-06-01 NOTE — ED Notes (Signed)
Patient able to ambulate independently  

## 2017-06-01 NOTE — ED Provider Notes (Signed)
[facility] EMERGENCY DEPARTMENT Provider Note CSN: [id number] Arrival date & time: 06/01/17  1323  History   Chief Complaint Chief Complaint  Patient presents with  . Emesis  . Dizziness   HPI SYAIR FRICKER is a 66 y.o. male.  The history is provided by the patient and the spouse.  Emesis   This is a new problem. The current episode started more than 1 week ago. The problem occurs 2 to 4 times per day. The problem has not changed since onset.The emesis has an appearance of stomach contents. There has been no fever. Pertinent negatives include no abdominal pain, no arthralgias, no chills, no cough and no fever.  Dizziness  Associated symptoms: nausea, vomiting and weakness   Associated symptoms: no chest pain, no palpitations and no shortness of breath   Near Syncope  This is a new problem. The current episode started more than 1 week ago. The problem occurs daily. The problem has not changed since onset.Pertinent negatives include no chest pain, no abdominal pain and no shortness of breath. The symptoms are aggravated by standing, exertion and walking. The symptoms are relieved by lying down.   Past Medical History:  Diagnosis Date  . Atrial fibrillation ([place])   . Carotid artery occlusion   . DJD (degenerative joint disease) of knee   . Essential hypertension, benign    takes Benicar daily  . Goals of care, counseling/discussion 09/18/2016  . History of colon polyps    benign  . History of gout   . History of radiation therapy 09/30/16-11/10/16   left lung 60 Gy in 30 fractions  . Hyperlipidemia    takes Fenofibrate and Crestor daily  . Insomnia    takes Ambien nightly as needed  . Joint pain   . Lung cancer ([place])   . Nocturia   . Peripheral vascular disease ([place])   . Primary localized osteoarthritis of left knee 12/26/2014  . Stroke [facility]) 2015?   takes Plavix daily as well as Pletal,not on plavix at present 05/15/15  . 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 Thrombus of left atrial appendage following myocardial infarction ([place])   . AKI (acute kidney injury) ([place]) 03/05/2017  . HCAP (healthcare-associated pneumonia)   . Transaminitis   . Pleural effusion on left   . Postobstructive pneumonia 03/01/2017  . Acute respiratory failure with hypoxia ([place]) 03/01/2017  . Lactic acidosis 03/01/2017  . CAP (community acquired pneumonia) 02/02/2017  . Acute on chronic diastolic CHF (congestive heart failure) ([place]) 02/02/2017  . Port catheter in place 11/05/2016  . Malignant neoplasm of lung ([place])   . Anemia   . Atrial fibrillation ([place]) 10/21/2016  . Small cell lung carcinoma, left ([place]) 09/18/2016  . Goals of care, counseling/discussion 09/18/2016  . Encounter for antineoplastic chemotherapy 09/18/2016  . Hilar lymphadenopathy   . COPD (chronic obstructive pulmonary disease) ([place]) 09/03/2016  . PAD (peripheral artery disease) ([place]) 05/17/2015  . Primary localized osteoarthritis of left knee 12/26/2014  . Knee osteoarthritis 12/26/2014  . Occlusion and stenosis of carotid artery without mention of cerebral infarction 04/21/2014  . History of stroke 04/21/2014  . Carotid stenosis with cerebral infarction less than 8 weeks ago 04/16/2014  . Diabetes mellitus, type 2 ([place]) 04/14/2014  . Obesity 04/14/2014  . Insomnia 10/18/2013  . Preop cardiovascular  exam 12/12/2011  . DJD (degenerative joint disease) of knee   . ED (erectile dysfunction)   . GERD (gastroesophageal reflux disease)   . Essential hypertension, benign   . Peripheral vascular disease ([place] )   . Hyperlipidemia   . Critical lower limb ischemia 11/19/2011    Past Surgical History:  Procedure Laterality Date  . ABDOMINAL AORTAGRAM N/A 03/29/2012   Procedure: ABDOMINAL Maxcine Ham;  Surgeon: Angelia Mould, MD;  Location: [facility] CATH LAB;  Service: Cardiovascular;  Laterality: N/A;  . CARDIOVERSION N/A 05/08/2017   Procedure: CARDIOVERSION;  Surgeon: Minna Merritts, MD;  Location: [facility] ORS;  Service: Cardiovascular;  Laterality: N/A;  . COLONOSCOPY    . ENDARTERECTOMY Right 05/09/2014   Procedure: RIGHT CAROTID ENDARTERECTOMY WITH PATCH ANGIOPLASTY;  Surgeon: Conrad Red Oak, MD;  Location: [facility];  Service: Vascular;  Laterality: Right;  . ENDARTERECTOMY FEMORAL Right 05/17/2015   Procedure: ENDARTERECTOMY FEMORAL;  Surgeon: Angelia Mould, MD;  Location: [facility];  Service: Vascular;  Laterality: Right;  . ENDOBRONCHIAL ULTRASOUND Bilateral 09/08/2016   Procedure: ENDOBRONCHIAL ULTRASOUND;  Surgeon: Rigoberto Noel, MD;  Location: [facility] ENDOSCOPY;  Service: Cardiopulmonary;  Laterality: Bilateral;  . FEMORAL-POPLITEAL BYPASS GRAFT Right 05/17/2015   Procedure: BYPASS GRAFT FEMORAL-BELOW KNEE POPLITEAL ARTERY, using gortex propaten graft 6 mm x 80 cm;  Surgeon: Angelia Mould, MD;  Location: [place];  Service: Vascular;  Laterality: Right;  . IR GENERIC HISTORICAL  09/25/2016   IR FLUORO GUIDE PORT INSERTION RIGHT 09/25/2016 Markus Daft, MD [facility]-INTERV RAD  . IR GENERIC HISTORICAL  09/25/2016   IR US GUIDE VASC ACCESS RIGHT 09/25/2016 Markus Daft, MD [facility]INTER[facility]  . KNEE ARTHROSCOPY Right 11/2009  . LOWER EXTREMITY ANGIOGRAM Bilateral 03/23/2015   Procedure: Lower Extremity Angiogram;  Surgeon: Angelia Mould, MD;  Location: [facility] CV LAB;  Service: Cardiovascular;  Laterality: Bilateral;  . MENISCUS REPAIR  11/2009  . PARTIAL KNEE ARTHROPLASTY Left 12/26/2014   Procedure: UNICOMPARTMENTAL KNEE;  Surgeon: Marchia Bond, MD;  Location: [place];  Service: Orthopedics;  Laterality: Left;  . PATCH ANGIOPLASTY Right 05/17/2015   Procedure: VEIN PATCH ANGIOPLASTY ILEOFEMORAL ARTERY;  Surgeon: Angelia Mould, MD;  Location: [place];  Service: Vascular;  Laterality: Right;  . PERCUTANEOUS STENT  INTERVENTION N/A 05/10/2012   Procedure: PERCUTANEOUS STENT INTERVENTION;  Surgeon: Angelia Mould, MD;  Location: [facility] CATH LAB;  Service: Cardiovascular;  Laterality: N/A;  . PERIPHERAL VASCULAR CATHETERIZATION N/A 03/23/2015   Procedure: Abdominal Aortogram;  Surgeon: Angelia Mould, MD;  Location: [place] CV LAB;  Service: Cardiovascular;  Laterality: N/A;  . STENTS     PLACED IN ??BOTH LEGS   2013?  . TEE WITHOUT CARDIOVERSION N/A 03/06/2017   Procedure: TRANSESOPHAGEAL ECHOCARDIOGRAM (TEE);  Surgeon: Larey Dresser, MD;  Location: [facility] ENDOSCOPY;  Service: Cardiovascular;  Laterality: N/A;  . TEE WITHOUT CARDIOVERSION N/A 05/08/2017   Procedure: TRANSESOPHAGEAL ECHOCARDIOGRAM (TEE);  Surgeon: Minna Merritts, MD;  Location: [facility] ORS;  Service: Cardiovascular;  Laterality: N/A;    Home Medications    Prior to Admission medications   Medication Sig Start Date End Date Taking? Authorizing Provider  amiodarone (PACERONE) 200 MG tablet Take 1 tablet (200 mg total) by mouth 2 (two) times daily. 03/10/17   Regalado, Belkys A, MD  apixaban (ELIQUIS) 5 MG TABS tablet Take 1 tablet (5 mg total) by mouth 2 (two) times daily. 03/10/17   Regalado, Belkys A, MD  digoxin (LANOXIN) 0.125 MG tablet Take 1 tablet (  0.125 mg total) by mouth daily. Patient taking differently: Take 0.0313 mg by mouth daily.  03/17/17   Minna Merritts, MD  enalapril (VASOTEC) 2.5 MG tablet Take 1 tablet (2.5 mg total) by mouth daily. Patient not taking: Reported on 05/05/2017 03/23/17   Minna Merritts, MD  famotidine (PEPCID) 10 MG tablet Take 20 mg by mouth daily as needed for heartburn or indigestion.    [provider]  furosemide (LASIX) 40 MG tablet Take 1 tablet (40 mg total) by mouth 2 (two) times daily. 03/23/17   Minna Merritts, MD  insulin glargine (LANTUS) 100 UNIT/ML injection Inject 0.1 mLs (10 Units total) into the skin daily. Patient taking differently: Inject 13 Units into the skin  daily.  03/11/17   Regalado, Belkys A, MD  ipratropium (ATROVENT) 0.02 % nebulizer solution Take 2.5 mLs (0.5 mg total) by nebulization 2 (two) times daily. Patient not taking: Reported on 04/15/2017 03/10/17   Regalado, Jerald Kief A, MD  levalbuterol (XOPENEX) 1.25 MG/0.5ML nebulizer solution Take 1.25 mg by nebulization 2 (two) times daily as needed for wheezing or shortness of breath. Patient not taking: Reported on 04/15/2017 03/10/17   Regalado, Jerald Kief A, MD  LORazepam (ATIVAN) 1 MG tablet Take 1 tablet (1 mg total) by mouth every 12 (twelve) hours as needed for anxiety. 03/10/17   Regalado, Belkys A, MD  metoprolol succinate (TOPROL-XL) 100 MG 24 hr tablet Take 100 mg by mouth daily. Take with or immediately following a meal.    [provider]  ondansetron (ZOFRAN) 4 MG tablet Take 4 mg by mouth every 8 (eight) hours as needed for nausea or vomiting.    [provider]  potassium chloride SA (K-DUR,KLOR-CON) 20 MEQ tablet Take 1 tablet (20 mEq total) by mouth daily. 03/10/17   Regalado, Belkys A, MD  spironolactone (ALDACTONE) 25 MG tablet Take 1 tablet (25 mg total) by mouth daily. 03/11/17   Regalado, Belkys A, MD  zolpidem (AMBIEN) 10 MG tablet Take 10 mg by mouth at bedtime as needed for sleep.  01/26/15   [provider]   Family History Family History  Problem Relation Age of Onset  . Diabetes Mother   . Hypertension Mother   . Heart disease Mother        Coronary Artery Bypass Graft  . Hyperlipidemia Mother   . Heart attack Mother   . Hypertension Father   . Hyperlipidemia Sister   . Stroke Sister    Social History Social History  Substance Use Topics  . Smoking status: Former Smoker    Packs/day: 0.00    Years: 0.00  . Smokeless tobacco: Never Used     Comment: quit smoking 4 yrs ago  . Alcohol use 0.0 oz/week     Comment: beer- occasionally   Allergies   Cialis [tadalafil]  Review of Systems Review of Systems  Constitutional: Negative for chills and  fever.  HENT: Negative for ear pain and sore throat.   Eyes: Negative for pain and visual disturbance.  Respiratory: Negative for cough and shortness of breath.   Cardiovascular: Positive for near-syncope. Negative for chest pain and palpitations.  Gastrointestinal: Positive for constipation, nausea and vomiting. Negative for abdominal pain.  Genitourinary: Negative for dysuria and hematuria.  Musculoskeletal: Negative for arthralgias and back pain.  Skin: Negative for color change and rash.  Neurological: Positive for dizziness, weakness and light-headedness. Negative for seizures and syncope.  All other systems reviewed and are negative.  Physical Exam Updated Vital  Signs BP 115/76 (BP Location: Left Arm)   Pulse (!) 102   Temp 98.2 F (36.8 C) (Oral)   Resp 20   Ht 6\' 1"  (1.854 m)   Wt 78.9 kg (174 lb)   SpO2 97%   BMI 22.96 kg/m   Physical Exam  Constitutional: He is oriented to person, place, and time. He appears well-developed. He appears ill (chronically).  HENT:  Head: Normocephalic and atraumatic.  Eyes: Pupils are equal, round, and reactive to light. Conjunctivae and EOM are normal.  Neck: Normal range of motion. Neck supple.  Cardiovascular: Normal rate and regular rhythm.   No murmur heard. Pulmonary/Chest: Effort normal and breath sounds normal. No respiratory distress.  Abdominal: Soft. There is no tenderness.  Musculoskeletal: Normal range of motion. He exhibits no edema, tenderness or deformity.  Neurological: He is alert and oriented to person, place, and time.  Skin: Skin is warm and dry.  Psychiatric: He has a normal mood and affect.  Nursing note and vitals reviewed.  ED Treatments / Results  Labs (all labs ordered are listed, but only abnormal results are displayed) Labs Reviewed  BASIC METABOLIC PANEL - Abnormal; Notable for the following:       Result Value   Sodium 130 (*)    Chloride 91 (*)    Glucose, Bld 209 (*)    BUN 23 (*)     Creatinine, Ser 1.64 (*)    GFR calc non Af Amer 43 (*)    GFR calc Af Amer 49 (*)    All other components within normal limits  CBC - Abnormal; Notable for the following:    RBC 3.20 (*)    Hemoglobin 10.9 (*)    HCT 31.2 (*)    MCH 34.1 (*)    All other components within normal limits  URINALYSIS, ROUTINE W REFLEX MICROSCOPIC - Abnormal; Notable for the following:    Color, Urine AMBER (*)    APPearance HAZY (*)    All other components within normal limits  CBG MONITORING, ED - Abnormal; Notable for the following:    Glucose-Capillary 207 (*)    All other components within normal limits  TROPONIN I    EKG  EKG Interpretation  Date/Time:  Monday June 01 2017 14:02:17 EDT Ventricular Rate:  105 PR Interval:    QRS Duration: 124 QT Interval:  414 QTC Calculation: 547 R Axis:   49 Text Interpretation:  Sinus tachycardia Septal infarct , age undetermined ST-t wave abnormality Abnormal ekg Confirmed by Carmin Muskrat [phone]) on 06/01/2017 2:09:07 PM      Radiology No results found.  Procedures Procedures (including critical care time)  Medications Ordered in ED Medications - No data to display  Initial Impression / Assessment and Plan / ED Course  I have reviewed the triage vital signs and the nursing notes.  Pertinent labs & imaging results that were available during my care of the patient were reviewed by me and considered in my medical decision making (see chart for details).  Jesse Avila is a 66 y.o. male with PMH of T2DM, HLD, HTN, and lung cancer who presents with lightheadedness, nausea, and vomiting.   Patient has no concerning significant signs or symptoms of syncope including exertional chest pain or exertional syncope. No family history of sudden cardiac death or unexplained death.   Patient had no chest pain, no concerning EKG findings. Specifically no signs of Brugada, HOCM, pre-excitation, or prolonged QT.  EKG revealed sinus tachycardia  Patient  denies tearing chest pain or back pain, therefore I doubt Aortic dissection.   No postictal period, loss of incontinence, tongue biting  or witnessed tonic clonic movements. Doubt Seizure.   Patient denies headache prior to or currently. No focal deficits found on neuro exam. Doubt SAH or ICH.   Patient denies any rectal bleeding, melena, no pallor on exam. No diplopia , dysarthria, ataxia, or vertigo prior to or after the event, doubt posterior circulation stroke.  Patient denies any alcohol or illicit drug use.   No history of CHF, Hematocrit > 30%, EKG normal appearing, no shortness of breath, no hypotension (no SBP<90). According to [facility] Syncope Rule, patient in low risk category for adverse events following syncope. No emergent causes identiified on thorough history and exam.  Lab studies: troponin negative, LFTs normal, lipase normal, ammonia normal, BMP remarkable for hyperglycemia 209, creatine of 1.6 appears to be similar to patient's baseline, CBC with mild anemia Hgb 10.9 consistent with patient's baseline, no leukocytosis, normal plt.   CT abdomen/pel:Increased colonic stool burden consistent with constipation. No bowel inflammation nor obstruct.  Imaging and labs studies without evidence of appendicitis, SBO, AAA, UTI, Pyelonephritis, Nephrolithiasis, Pancreatitis, Cholecystitis, Perforated Bowel or Ulcer, Diverticulitis, Ischemic Mesentery, Inflammatory Bowel Disease, Strangulated/Incarcerated Hernia,  or other abdominal emergency as this would be inconsistent with history and physical, low risk, other diagnosis much more likely.   Patient able to ambulate in the department without assistance.   At this time, we believe patient is safe for discharge home and to follow up with their primary care doctor. Patient in agreement with plan and discharged home.   Dc home, return precautions given, follow up with primary care provider, patient in agreement with plan.  The plan for  this patient was discussed with Dr. Wilson Singer, who voiced agreement and who oversaw evaluation and treatment of this patient.   Final Clinical Impressions(s) / ED Diagnoses   Final diagnoses:  Lightheadedness   New Prescriptions New Prescriptions   No medications on file     Fenton Foy, MD 06/03/17 1157    Virgel Manifold, MD 06/16/17 [phone]

## 2017-06-01 NOTE — ED Notes (Signed)
Patient transported to CT 

## 2017-06-01 NOTE — ED Triage Notes (Signed)
Per Pt, Pt is coming from home with complaints of dizziness, N/V that started two weeks ago. Pt reports vomiting every morning and then will be able to eat some later in the day. Dizziness is positional and happens with standing. Hx of Cancer and Cardiac.

## 2017-06-19 ENCOUNTER — Other Ambulatory Visit (HOSPITAL_BASED_OUTPATIENT_CLINIC_OR_DEPARTMENT_OTHER): Payer: BLUE CROSS/BLUE SHIELD

## 2017-06-19 ENCOUNTER — Encounter (HOSPITAL_COMMUNITY): Payer: Self-pay

## 2017-06-19 ENCOUNTER — Ambulatory Visit (HOSPITAL_COMMUNITY)
Admission: RE | Admit: 2017-06-19 | Discharge: 2017-06-19 | Disposition: A | Discharge status: Home / Self Care | Payer: BLUE CROSS/BLUE SHIELD | Source: Ambulatory Visit | Attending: Internal Medicine | Admitting: Internal Medicine

## 2017-06-19 ENCOUNTER — Ambulatory Visit: Payer: BLUE CROSS/BLUE SHIELD | Admitting: Cardiovascular Disease

## 2017-06-19 DIAGNOSIS — C3412 Malignant neoplasm of upper lobe, left bronchus or lung: Secondary | ICD-10-CM

## 2017-06-19 DIAGNOSIS — C3492 Malignant neoplasm of unspecified part of left bronchus or lung: Secondary | ICD-10-CM

## 2017-06-19 DIAGNOSIS — C7951 Secondary malignant neoplasm of bone: Secondary | ICD-10-CM

## 2017-06-19 DIAGNOSIS — I700 Atherosclerosis of aorta: Secondary | ICD-10-CM | POA: Insufficient documentation

## 2017-06-19 LAB — CBC WITH DIFFERENTIAL/PLATELET
BASO%: 0.2 % (ref 0.0–2.0)
BASOS ABS: 0 10*3/uL (ref 0.0–0.1)
EOS%: 2.2 % (ref 0.0–7.0)
Eosinophils Absolute: 0.1 10*3/uL (ref 0.0–0.5)
HCT: 30.8 % — ABNORMAL LOW (ref 38.4–49.9)
HEMOGLOBIN: 10.1 g/dL — AB (ref 13.0–17.1)
LYMPH%: 24.3 % (ref 14.0–49.0)
MCH: 33.4 pg (ref 27.2–33.4)
MCHC: 32.8 g/dL (ref 32.0–36.0)
MCV: 102 fL — ABNORMAL HIGH (ref 79.3–98.0)
MONO#: 0.4 10*3/uL (ref 0.1–0.9)
MONO%: 9.3 % (ref 0.0–14.0)
NEUT%: 64 % (ref 39.0–75.0)
NEUTROS ABS: 2.9 10*3/uL (ref 1.5–6.5)
Platelets: 128 10*3/uL — ABNORMAL LOW (ref 140–400)
RBC: 3.02 10*6/uL — ABNORMAL LOW (ref 4.20–5.82)
RDW: 15.1 % — AB (ref 11.0–14.6)
WBC: 4.6 10*3/uL (ref 4.0–10.3)
lymph#: 1.1 10*3/uL (ref 0.9–3.3)

## 2017-06-19 LAB — COMPREHENSIVE METABOLIC PANEL
ALT: 14 U/L (ref 0–55)
AST: 15 U/L (ref 5–34)
Albumin: 3.3 g/dL — ABNORMAL LOW (ref 3.5–5.0)
Alkaline Phosphatase: 116 U/L (ref 40–150)
Anion Gap: 11 mEq/L (ref 3–11)
BILIRUBIN TOTAL: 0.56 mg/dL (ref 0.20–1.20)
BUN: 12.6 mg/dL (ref 7.0–26.0)
CALCIUM: 9.6 mg/dL (ref 8.4–10.4)
CHLORIDE: 97 meq/L — AB (ref 98–109)
CO2: 28 mEq/L (ref 22–29)
CREATININE: 1.3 mg/dL (ref 0.7–1.3)
EGFR: 58 mL/min/{1.73_m2} — ABNORMAL LOW (ref >60)
Glucose: 211 mg/dl — ABNORMAL HIGH (ref 70–140)
Potassium: 4.1 mEq/L (ref 3.5–5.1)
Sodium: 137 mEq/L (ref 136–145)
TOTAL PROTEIN: 7.2 g/dL (ref 6.4–8.3)

## 2017-06-19 MED ORDER — IOPAMIDOL (ISOVUE-300) INJECTION 61%
INTRAVENOUS | Status: AC
  Filled 2017-06-19: qty 100

## 2017-06-19 MED ORDER — IOPAMIDOL (ISOVUE-300) INJECTION 61%
100.0000 mL | Freq: Once | INTRAVENOUS | Status: AC | PRN
  Administered 2017-06-19: 100 mL via INTRAVENOUS

## 2017-06-20 NOTE — Progress Notes (Signed)
  Radiation Oncology         [phone] ________________________________  Name: Jesse Avila MRN: [id number]  Date: 03/20/2017  DOB: 11/13/1950  End of Treatment Note  Diagnosis:   Limited stage small cell lung cancer     Indication for treatment:  Prophylactic cranial irradiation       Radiation treatment dates:   03/09/2017 through 03/20/2017  Site/dose:   Whole brain, 25 gray in 10 fractions  Beams/energy:   Lateral fields, 6 megavolt photons  Narrative: The patient tolerated radiation treatment relatively well.   He denies any nausea or headaches during the course of this treatment. Patient was admitted for a portion of his treatment for management of atrial fibrillation.  Plan: The patient has completed radiation treatment. The patient will return to radiation oncology clinic for routine followup in one month. I advised them to call or return sooner if they have any questions or concerns related to their recovery or treatment.  -----------------------------------  Blair Promise, PhD, MD

## 2017-06-20 NOTE — Addendum Note (Signed)
Encounter addended by: Gery Pray, MD on: 06/20/2017 3:43 PM  Actions taken: Sign clinical note

## 2017-06-23 ENCOUNTER — Ambulatory Visit (HOSPITAL_BASED_OUTPATIENT_CLINIC_OR_DEPARTMENT_OTHER): Payer: BLUE CROSS/BLUE SHIELD | Admitting: Internal Medicine

## 2017-06-23 ENCOUNTER — Encounter: Payer: Self-pay | Admitting: Internal Medicine

## 2017-06-23 ENCOUNTER — Telehealth: Payer: Self-pay | Admitting: Internal Medicine

## 2017-06-23 VITALS — BP 121/66 | HR 85 | Temp 97.9°F | Resp 16 | Ht 73.0 in | Wt 183.2 lb

## 2017-06-23 DIAGNOSIS — C3492 Malignant neoplasm of unspecified part of left bronchus or lung: Secondary | ICD-10-CM

## 2017-06-23 DIAGNOSIS — Z85118 Personal history of other malignant neoplasm of bronchus and lung: Secondary | ICD-10-CM | POA: Diagnosis not present

## 2017-06-23 DIAGNOSIS — D61810 Antineoplastic chemotherapy induced pancytopenia: Secondary | ICD-10-CM | POA: Diagnosis not present

## 2017-06-23 NOTE — Progress Notes (Addendum)
Jesse Avila [phone]   [phone]  OFFICE PROGRESS NOTE  [name], MD [address] [place] [address]  DIAGNOSIS: Extensive stage (T4, N2, M1b) small cell lung cancer presented with large left upper lobe lung mass with mediastinal invasion as well as pancreatic lesion and suspicious left iliac bone metastasis diagnosed in February 2018.  PRIOR THERAPY: 1) Systemic chemotherapy with carboplatin for AUC of 5 on day 1 and etoposide 100 MG/M2 on days 1, 2 and 3 with Neulasta support status post 6 cycles. This is concurrent with radiotherapy. This also concurrent with radiation to the large lung mass. 2) prophylactic cranial irradiation under the care of Dr. Sondra Come.  CURRENT THERAPY: Observation.   INTERVAL HISTORY: Jesse Avila 66 y.o. male returns to the clinic today for 59-month follow-up visit accompanied by his wife.  The patient is feeling fine today with no specific complaints.  He had some constipation recently but this resolved.  He denied having any nausea, vomiting or diarrhea.  He denied having any chest pain, shortness of breath, cough or hemoptysis.  He has occasional dizzy spells but that also resolved at this point.  He denied having any weight loss or night sweats.  He has no fever or chills.  He had a repeat CT scan of the chest, abdomen and pelvis performed recently and he is here for evaluation and discussion of his discuss results.  MEDICAL HISTORY: Past Medical History:  Diagnosis Date  . Atrial fibrillation ([place])   . Carotid artery occlusion   . DJD (degenerative joint disease) of knee   . Essential hypertension, benign    takes Benicar daily  . Goals of care, counseling/discussion 09/18/2016  . History of colon polyps    benign  . History of gout   . History of radiation therapy 09/30/16-11/10/16   left lung 60 Gy in 30 fractions  . Hyperlipidemia    takes Fenofibrate and Crestor daily  .  Insomnia    takes Ambien nightly as needed  . Joint pain   . Lung cancer ([place])   . Nocturia   . Peripheral vascular disease ([place])   . Primary localized osteoarthritis of left knee 12/26/2014  . Stroke [facility]) 2015?   takes Plavix daily as well as Pletal,not on plavix at present 05/15/15  . 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  MEDICATIONS:  Current Outpatient Medications  Medication Sig Dispense Refill  . AFLURIA QUADRIVALENT 0.5 ML injection TO BE ADMINISTERED BY PHARMACIST FOR IMMUNIZATION  0  . amiodarone (PACERONE) 200 MG tablet Take 1 tablet (200 mg total) by mouth 2 (two) times daily. (Patient taking differently: Take 200 mg by mouth daily. ) 60 tablet 0  . apixaban (ELIQUIS) 5 MG TABS tablet Take 1 tablet (5 mg total) by mouth 2 (two) times daily. 60 tablet 3  . digoxin (LANOXIN) 0.125 MG tablet Take 1 tablet (0.125 mg total) by mouth daily. (Patient taking differently: Take 0.0313 mg by mouth daily. ) 30 tablet 6  . enalapril (VASOTEC) 2.5 MG tablet Take 1 tablet (2.5 mg total) by mouth daily. 90 tablet 3  . furosemide (LASIX) 40 MG tablet Take 1 tablet (40 mg total) by mouth 2 (two) times daily. 60 tablet 6  . insulin glargine (LANTUS) 100 UNIT/ML injection Inject  0.1 mLs (10 Units total) into the skin daily. (Patient taking differently: Inject 13 Units into the skin at bedtime. ) 10 mL 11  . ipratropium (ATROVENT) 0.02 % nebulizer solution Take 2.5 mLs (0.5 mg total) by nebulization 2 (two) times daily. (Patient taking differently: Take 0.5 mg by nebulization 2 (two) times daily as needed for wheezing or shortness of breath. ) 75 mL 12  . levalbuterol (XOPENEX) 1.25 MG/0.5ML nebulizer solution Take 1.25 mg by nebulization 2 (two) times daily as needed for wheezing or shortness of breath. 1 each 12  . LORazepam (ATIVAN) 1  MG tablet Take 1 tablet (1 mg total) by mouth every 12 (twelve) hours as needed for anxiety. 30 tablet 0  . metoprolol succinate (TOPROL-XL) 100 MG 24 hr tablet TAKE ONE TABLET BY MOUTH DAILY FOR BP AND HEART RATE  1  . mirtazapine (REMERON) 7.5 MG tablet Take 1 tablet (7.5 mg total) by mouth at bedtime. 14 tablet 0  . potassium chloride SA (K-DUR,KLOR-CON) 20 MEQ tablet Take 1 tablet (20 mEq total) by mouth daily. 30 tablet 0  . promethazine (PHENERGAN) 25 MG tablet Take 12.5-25 mg by mouth every 8 (eight) hours as needed for nausea or vomiting.   1  . spironolactone (ALDACTONE) 25 MG tablet Take 1 tablet (25 mg total) by mouth daily. 30 tablet 0  . zolpidem (AMBIEN) 10 MG tablet Take 10 mg by mouth at bedtime.      No current facility-administered medications for this visit.    Facility-Administered Medications Ordered in Other Visits  Medication Dose Route Frequency Provider Last Rate Last Dose  . heparin lock flush 100 unit/mL  500 Units Intracatheter Once PRN Curt Bears, MD      . sodium chloride flush (NS) 0.9 % injection 10 mL  10 mL Intracatheter PRN Curt Bears, MD   10 mL at 10/13/16 1618  . sodium chloride flush (NS) 0.9 % injection 10 mL  10 mL Intracatheter PRN Curt Bears, MD      . sodium chloride flush (NS) 0.9 % injection 10 mL  10 mL Intracatheter PRN Curt Bears, MD   10 mL at 11/26/16 1544    SURGICAL HISTORY:  Past Surgical History:  Procedure Laterality Date  . ABDOMINAL AORTAGRAM N/A 03/29/2012   Performed by Angelia Mould, MD at [facility] CATH LAB  . Abdominal Aortogram N/A 03/23/2015   Performed by Angelia Mould, MD at [place] CV LAB  . BYPASS GRAFT FEMORAL-BELOW KNEE POPLITEAL ARTERY, using gortex propaten graft 6 mm x 80 cm Right 05/17/2015   Performed by Angelia Mould, MD at [place]  . CARDIOVERSION N/A 05/08/2017   Performed by Minna Merritts, MD at [facility] ORS  . COLONOSCOPY    . ENDARTERECTOMY FEMORAL Right  05/17/2015   Performed by Angelia Mould, MD at [place] Bilateral 09/08/2016   Performed by Rigoberto Noel, MD at [place]  . IR GENERIC HISTORICAL  09/25/2016   IR FLUORO GUIDE PORT INSERTION RIGHT 09/25/2016 Markus Daft, MD [facility]-INTERV RAD  . IR GENERIC HISTORICAL  09/25/2016   IR US GUIDE VASC ACCESS RIGHT 09/25/2016 Markus Daft, MD [facility]-INTERV RAD  . KNEE ARTHROSCOPY Right 11/2009  . Lower Extremity Angiogram Bilateral 03/23/2015   Performed by Angelia Mould, MD at [place] CV LAB  . MENISCUS REPAIR  11/2009  . PERCUTANEOUS STENT INTERVENTION N/A 05/10/2012   Performed by Angelia Mould, MD at [facility] CATH LAB  .  RIGHT CAROTID ENDARTERECTOMY WITH PATCH ANGIOPLASTY Right 05/09/2014   Performed by Conrad Plainview, MD at [place] ??BOTH LEGS   2013?  . TRANSESOPHAGEAL ECHOCARDIOGRAM (TEE) N/A 05/08/2017   Performed by Minna Merritts, MD at [facility] ORS  . TRANSESOPHAGEAL ECHOCARDIOGRAM (TEE) N/A 03/06/2017   Performed by Larey Dresser, MD at [place]  . UNICOMPARTMENTAL KNEE Left 12/26/2014   Performed by Marchia Bond, MD at [place]  . VEIN PATCH ANGIOPLASTY ILEOFEMORAL ARTERY Right 05/17/2015   Performed by Angelia Mould, MD at [place]:  A comprehensive review of systems was negative.   PHYSICAL EXAMINATION: General appearance: alert, cooperative and no distress Head: Normocephalic, without obvious abnormality, atraumatic Neck: no adenopathy, no JVD, supple, symmetrical, trachea midline and thyroid not enlarged, symmetric, no tenderness/mass/nodules Lymph nodes: Cervical, supraclavicular, and axillary nodes normal. Resp: clear to auscultation bilaterally Back: symmetric, no curvature. ROM normal. No CVA tenderness. Cardio: regular rate and rhythm, S1, S2 normal, no murmur, click, rub or gallop GI: soft, non-tender; bowel sounds normal; no masses,  no organomegaly Extremities: extremities normal,  atraumatic, no cyanosis or edema  ECOG PERFORMANCE STATUS: 1 - Symptomatic but completely ambulatory  Blood pressure 121/66, pulse 85, temperature 97.9 F (36.6 C), temperature source Oral, resp. rate 16, height 6\' 1"  (1.854 m), weight 183 lb 3.2 oz (83.1 kg), SpO2 100 %.  LABORATORY DATA: Lab Results  Component Value Date   WBC 4.6 06/19/2017   HGB 10.1 (L) 06/19/2017   HCT 30.8 (L) 06/19/2017   MCV 102.0 (H) 06/19/2017   PLT 128 (L) 06/19/2017      Chemistry      Component Value Date/Time   NA 137 06/19/2017 0922   K 4.1 06/19/2017 0922   CL 91 (L) 06/01/2017 1403   CO2 28 06/19/2017 0922   BUN 12.6 06/19/2017 0922   CREATININE 1.3 06/19/2017 0922      Component Value Date/Time   CALCIUM 9.6 06/19/2017 0922   ALKPHOS 116 06/19/2017 0922   AST 15 06/19/2017 0922   ALT 14 06/19/2017 0922   BILITOT 0.56 06/19/2017 0922       RADIOGRAPHIC STUDIES: Ct Chest W Contrast  Result Date: 06/20/2017 CLINICAL DATA:  Small-cell lung cancer.  Status post chemo radiation EXAM: CT CHEST, ABDOMEN, AND PELVIS WITH CONTRAST TECHNIQUE: Multidetector CT imaging of the chest, abdomen and pelvis was performed following the standard protocol during bolus administration of intravenous contrast. CONTRAST:  132mL ISOVUE-300 IOPAMIDOL (ISOVUE-300) INJECTION 61% COMPARISON:  None. 06/01/2017 abdomen and pelvis CT.  Chest CT 04/13/2017. FINDINGS: CT CHEST FINDINGS Cardiovascular: The heart size is normal. No pericardial effusion. Coronary artery calcification is evident. Atherosclerotic calcification is noted in the wall of the thoracic aorta. Right Port-A-Cath tip is positioned in the distal SVC. Mediastinum/Nodes: Right suprahilar lesion measured previously at 4.2 x 3.1 cm now measures 4.0 x 3.1 cm. No other mediastinal or right hilar lymphadenopathy. The esophagus has normal imaging features. There is no axillary lymphadenopathy. Lungs/Pleura: Interstitial and airspace disease medial left apex and  parahilar left lung shows some interval evolutionary changes but is relatively similar to prior suggesting sequelae of radiation therapy. Small left pleural effusion is similar. Bilateral calcified pleural plaques again noted. Musculoskeletal: Bone windows reveal no worrisome lytic or sclerotic osseous lesions. CT ABDOMEN PELVIS FINDINGS Hepatobiliary: Asymmetric enlargement lateral segment left liver, as before. There is no evidence for gallstones, gallbladder wall  thickening, or pericholecystic fluid. No intrahepatic or extrahepatic biliary dilation. Pancreas: No focal mass lesion. No dilatation of the main duct. No intraparenchymal cyst. No peripancreatic edema. Spleen: No splenomegaly. No focal mass lesion. Adrenals/Urinary Tract: No adrenal nodule or mass. Tiny hypoattenuating lesion upper pole left kidney is stable and too small to characterize. Right kidney unremarkable. No evidence for hydroureter. The urinary bladder appears normal for the degree of distention. Stomach/Bowel: Stomach is nondistended. No gastric wall thickening. No evidence of outlet obstruction. Duodenum is normally positioned as is the ligament of Treitz. No small bowel wall thickening. No small bowel dilatation. The terminal ileum is normal. The appendix is normal. Diverticuli are seen scattered along the entire length of the colon without CT findings of diverticulitis. Vascular/Lymphatic: There is abdominal aortic atherosclerosis without aneurysm. There is no gastrohepatic or hepatoduodenal ligament lymphadenopathy. No intraperitoneal or retroperitoneal lymphadenopathy. No pelvic sidewall lymphadenopathy. Reproductive: The prostate gland and seminal vesicles have normal imaging features. Other: No intraperitoneal free fluid. Musculoskeletal: Subtle sclerotic lesion left iliac crest (image 103 series 2) is stable in the interval comparing back to 11/04/2016. No other suspicious lytic or sclerotic osseous abnormality is evident.  IMPRESSION: 1. Stable exam.  No new or progressive interval findings. 2. Previously characterized AP window lesion is stable in size. 3. Persistent interstitial and airspace disease in the left lung with a medial predominance suggesting sequelae of radiation treatment. 4. Sclerotic left iliac bone lesion is stable. No new lytic or sclerotic osseous abnormality. 5. Bilateral pleural plaques again identified suggesting prior asbestos exposure. 6. Asymmetric enlargement lateral segment left liver, stable. Underlying cirrhosis not excluded. 7.  Aortic Atherosclerois (ICD10-170.0) Electronically Signed   By: Misty Stanley M.D.   On: 06/20/2017 10:02   Ct Abdomen Pelvis W Contrast  Result Date: 06/20/2017 CLINICAL DATA:  Small-cell lung cancer.  Status post chemo radiation EXAM: CT CHEST, ABDOMEN, AND PELVIS WITH CONTRAST TECHNIQUE: Multidetector CT imaging of the chest, abdomen and pelvis was performed following the standard protocol during bolus administration of intravenous contrast. CONTRAST:  180mL ISOVUE-300 IOPAMIDOL (ISOVUE-300) INJECTION 61% COMPARISON:  None. 06/01/2017 abdomen and pelvis CT.  Chest CT 04/13/2017. FINDINGS: CT CHEST FINDINGS Cardiovascular: The heart size is normal. No pericardial effusion. Coronary artery calcification is evident. Atherosclerotic calcification is noted in the wall of the thoracic aorta. Right Port-A-Cath tip is positioned in the distal SVC. Mediastinum/Nodes: Right suprahilar lesion measured previously at 4.2 x 3.1 cm now measures 4.0 x 3.1 cm. No other mediastinal or right hilar lymphadenopathy. The esophagus has normal imaging features. There is no axillary lymphadenopathy. Lungs/Pleura: Interstitial and airspace disease medial left apex and parahilar left lung shows some interval evolutionary changes but is relatively similar to prior suggesting sequelae of radiation therapy. Small left pleural effusion is similar. Bilateral calcified pleural plaques again noted.  Musculoskeletal: Bone windows reveal no worrisome lytic or sclerotic osseous lesions. CT ABDOMEN PELVIS FINDINGS Hepatobiliary: Asymmetric enlargement lateral segment left liver, as before. There is no evidence for gallstones, gallbladder wall thickening, or pericholecystic fluid. No intrahepatic or extrahepatic biliary dilation. Pancreas: No focal mass lesion. No dilatation of the main duct. No intraparenchymal cyst. No peripancreatic edema. Spleen: No splenomegaly. No focal mass lesion. Adrenals/Urinary Tract: No adrenal nodule or mass. Tiny hypoattenuating lesion upper pole left kidney is stable and too small to characterize. Right kidney unremarkable. No evidence for hydroureter. The urinary bladder appears normal for the degree of distention. Stomach/Bowel: Stomach is nondistended. No gastric wall thickening. No evidence of outlet obstruction.  Duodenum is normally positioned as is the ligament of Treitz. No small bowel wall thickening. No small bowel dilatation. The terminal ileum is normal. The appendix is normal. Diverticuli are seen scattered along the entire length of the colon without CT findings of diverticulitis. Vascular/Lymphatic: There is abdominal aortic atherosclerosis without aneurysm. There is no gastrohepatic or hepatoduodenal ligament lymphadenopathy. No intraperitoneal or retroperitoneal lymphadenopathy. No pelvic sidewall lymphadenopathy. Reproductive: The prostate gland and seminal vesicles have normal imaging features. Other: No intraperitoneal free fluid. Musculoskeletal: Subtle sclerotic lesion left iliac crest (image 103 series 2) is stable in the interval comparing back to 11/04/2016. No other suspicious lytic or sclerotic osseous abnormality is evident. IMPRESSION: 1. Stable exam.  No new or progressive interval findings. 2. Previously characterized AP window lesion is stable in size. 3. Persistent interstitial and airspace disease in the left lung with a medial predominance suggesting  sequelae of radiation treatment. 4. Sclerotic left iliac bone lesion is stable. No new lytic or sclerotic osseous abnormality. 5. Bilateral pleural plaques again identified suggesting prior asbestos exposure. 6. Asymmetric enlargement lateral segment left liver, stable. Underlying cirrhosis not excluded. 7.  Aortic Atherosclerois (ICD10-170.0) Electronically Signed   By: Misty Stanley M.D.   On: 06/20/2017 10:02   Ct Abdomen Pelvis W Contrast  Result Date: 06/01/2017 CLINICAL DATA:  Nausea and vomiting x2 weeks. EXAM: CT ABDOMEN AND PELVIS WITH CONTRAST TECHNIQUE: Multidetector CT imaging of the abdomen and pelvis was performed using the standard protocol following bolus administration of intravenous contrast. CONTRAST:  80 cc Isovue-300 COMPARISON:  04/13/2017 FINDINGS: Lower chest: Normal heart size without pericardial effusion. There is coronary arteriosclerosis of included heart involving the RCA. No acute pneumonic consolidation, effusion or pneumothorax. Calcified pleural plaque is noted along the diaphragmatic pleura bilaterally. Hepatobiliary: Hypertrophy of the caudate and lateral segment of the left hepatic lobe is again noted without focal enhancing mass or biliary dilatation. No gallstones, gallbladder wall thickening or pericholecystic fluid. Pancreas: Unremarkable. No pancreatic ductal dilatation or surrounding inflammatory changes. Spleen: Normal in size without focal abnormality. Adrenals/Urinary Tract: Adrenal glands are unremarkable. Kidneys are normal, without renal calculi, focal lesion, or hydronephrosis. Bladder is unremarkable. Stomach/Bowel: Contracted stomach with normal small bowel rotation. Increased colonic stool burden throughout the colon with sigmoid diverticulosis. Normal appendix. Vascular/Lymphatic: Moderate aortoiliac and branch vessel atherosclerosis without aneurysm. No abdominopelvic adenopathy Reproductive: Prostate is unremarkable. Other: No abdominal wall hernia or  abnormality. No abdominopelvic ascites. Musculoskeletal: Go degenerative changes of the included lower thoracic spine with anterior osteophytes. No aggressive appearing osseous lesions or fracture. IMPRESSION: 1. Increased colonic stool burden consistent with constipation. 2. No bowel inflammation nor obstruct. 3. Sigmoid diverticulosis. 4. Calcified pleural plaque consistent with prior asbestos exposure. 5. Coronary arteriosclerosis and aortoiliac atherosclerosis. 6. Caudate and left hepatic lobe enlargement, stable in appearance without space-occupying mass. Findings may relate to changes of cirrhosis. Electronically Signed   By: Ashley Royalty M.D.   On: 06/01/2017 17:08    ASSESSMENT AND PLAN:  This is a very pleasant 66 years old white male with extensive stage small cell lung cancer completed 6 cycles of systemic chemotherapy with carboplatin and etoposide concurrent with radiation to the large left upper lobe mass.  The patient tolerated the treatment well except for the chemotherapy-induced pancytopenia and significant anemia. This was followed by prophylactic cranial irradiation. The patient has been in observation for the last several months. Repeat CT scan of the chest, abdomen and pelvis showed no evidence for disease progression.  I discussed the  scan results with the patient and his wife.  I recommended for him to continue on observation with repeat CT scan of the chest, abdomen and pelvis in 3 months. I recommended for the patient to call immediately if she has any recurrent dizzy spells or any other neurological symptoms as he may need repeat MRI of the brain to rule out brain metastasis. He was advised to call if he has any other concerning symptoms in the interval. The patient voices understanding of current disease status and treatment options and is in agreement with the current care plan. All questions were answered. The patient knows to call the clinic with any problems, questions or  concerns. We can certainly see the patient much sooner if necessary.  Disclaimer: This note was dictated with voice recognition software. Similar sounding words can inadvertently be transcribed and may not be corrected upon review.

## 2017-06-23 NOTE — Telephone Encounter (Signed)
Gave avs and calendar for February 2019 °

## 2017-06-24 ENCOUNTER — Ambulatory Visit: Payer: BLUE CROSS/BLUE SHIELD | Admitting: Internal Medicine

## 2017-07-02 NOTE — Progress Notes (Signed)
Cardiology Office Note  Date:  07/03/2017   ID:  FARREL GUIMOND, DOB 22-May-1951, MRN [id number]  PCP:  [name], MD   Chief Complaint  Patient presents with  . other    Follow up from cancelled cardioversion. Meds reviewed by the pt. verbally. "doing well."     HPI:  66 year old male With history of PAD, Carotid dz, HTN,  hyperlipidemia,  DM II, Hemoglobin A1c 8.5 Smoking hx, Atrial fibrillation, CHADSVASC score of 5.seen in hospital 10/2016, converted to NSR Started on eliquis, metoprolol and diltiazem Hx of CVA, mild residual left-sided weakness COPD He presents for follow-up of his  atrial fibrillation  05/08/2017, back in NSR , DCCV cancelled In follow-up today reports that he feels well  In the ER with lightheadedness  06/01/17 Hospital records reviewed with the patient in detail nausea and vomiting.  CT abdomen/pel:Increased colonic stool burden consistent with constipation. No bowel inflammation nor obstruct. troponin negative, LFTs normal, lipase normal, ammonia normal, BMP remarkable for hyperglycemia 209, creatine of 1.6 appears to be similar to patient's baseline, CBC with mild anemia Hgb 10.9 consistent with patient's baseline, no leukocytosis, normal plt.   Recent CT scan showing no progression of his cancer, things are stable No regular exercise, working on his appetite,   EKG personally reviewed by myself on todays visit Shows normal sinus rhythm rate 77 bpm, APCs poor R wave progression through the anterior precordial leads, unable to exclude anteroseptal MI   Other past medical history reviewed Hospitalized in [facility] for atrial fibrillation discharge date 03/10/2017 Hospital records reviewed with the patient in detail Acute respiratory failure with hypoxia secondary to acute systolic CHF ejection fraction 20% post obstructive pneumonia with large left pleural effusion He had thoracentesis, 1.5 L out Placed on antibiotics Thrombus in the left  atrium on TEE, cardioversion was canceled Continued on anticoagulation, started on digoxin  Currently undergoing brain radiation for left lung small cell carcinoma  All done with XRT  EF dropped from >55% in 10/2016, EF dropped  25%  Today feels weak, denies significant shortness of breath, no lower extremity edema Tolerating his medications  EKG personally reviewed by myself on todays visit Shows atrial fibrillation with ventricular rate 80 bpm ST abnormality anterolateral leads   hospitalization March 2018 seen by cardiology for new atrial fibrillation Started on diltiazem infusion and converted to sinus rhythm  small cell lung cancer 09/02/16   large left upper lobe lung mass with mediastinal invasion, pancreatic lesion and suspicious left iliac bone metastasis diagnosed in February 2018.  Undergoing Systemic chemotherapy with carbo platinum, etoposide,  radiation to the large lung mass (done with XRT)  CT chest: Coronary, aortic arch, and branch vessel atherosclerotic vascular disease. high-grade narrowing of the proximal superficial femoral arteries bilaterally.  echo3/21/18 Normal LV size with EF 50%. Basal inferior and inferoseptal   severe hypokinesis. Normal RV size and systolic function. No   significant valvular abnormalities.  Since discharge from the hospital he has felt tired He had 2 units of blood given for hemoglobin of 7 No check since that time in the computer system Otherwise no complaints Reports he is asymptomatic from his atrial fibrillation. Not monitoring blood pressure or heart rate at home  Lab work reviewed with him in detail showing hemoglobin A1c 8.1, creatinine 1.18, total cholesterol 178 with LDL 100 left    PMH:   has a past medical history of Atrial fibrillation ([place]), Carotid artery occlusion, DJD (degenerative joint disease) of knee, Essential hypertension, benign,  Goals of care, counseling/discussion (09/18/2016), History of colon polyps,  History of gout, History of radiation therapy (09/30/16-11/10/16), Hyperlipidemia, Insomnia, Joint pain, Lung cancer ([place]), Nocturia, Peripheral vascular disease ([place]), Primary localized osteoarthritis of left knee (12/26/2014), Stroke ([place]) (2015?), and 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  PSH:    Past Surgical History:  Procedure Laterality Date  . ABDOMINAL AORTAGRAM N/A 03/29/2012   Procedure: ABDOMINAL Maxcine Ham;  Surgeon: Angelia Mould, MD;  Location[facility] CATH LAB;  Service: Cardiovascular;  Laterality: N/A;  . CARDIOVERSION N/A 05/08/2017   Procedure: CARDIOVERSION;  Surgeon: Minna Merritts, MD;  Location: [facility] ORS;  Service: Cardiovascular;  Laterality: N/A;  . COLONOSCOPY    . ENDARTERECTOMY Right 05/09/2014   Procedure: RIGHT CAROTID ENDARTERECTOMY WITH PATCH ANGIOPLASTY;  Surgeon: Conrad Indianola, MD;  Location: [place];  Service: Vascular;  Laterality: Right;  . ENDARTERECTOMY FEMORAL Right 05/17/2015   Procedure: ENDARTERECTOMY FEMORAL;  Surgeon: Angelia Mould, MD;  Location: [place];  Service: Vascular;  Laterality: Right;  . ENDOBRONCHIAL ULTRASOUND Bilateral 09/08/2016   Procedure: ENDOBRONCHIAL ULTRASOUND;  Surgeon: Rigoberto Noel, MD;  Location: [facility] ENDOSCOPY;  Service: Cardiopulmonary;  Laterality: Bilateral;  . FEMORAL-POPLITEAL BYPASS GRAFT Right 05/17/2015   Procedure: BYPASS GRAFT FEMORAL-BELOW KNEE POPLITEAL ARTERY, using gortex propaten graft 6 mm x 80 cm;  Surgeon: Angelia Mould, MD;  Location: [place];  Service: Vascular;  Laterality: Right;  . IR GENERIC HISTORICAL  09/25/2016   IR FLUORO GUIDE PORT INSERTION RIGHT 09/25/2016 Markus Daft, MD [facility]-INTERV RAD  . IR GENERIC HISTORICAL  09/25/2016   IR US GUIDE VASC ACCESS RIGHT 09/25/2016 Markus Daft, MD [facility]INTER[facility]  . KNEE ARTHROSCOPY Right 11/2009  . LOWER EXTREMITY ANGIOGRAM Bilateral 03/23/2015   Procedure: Lower Extremity Angiogram;   Surgeon: Angelia Mould, MD;  Location: [place] CV LAB;  Service: Cardiovascular;  Laterality: Bilateral;  . MENISCUS REPAIR  11/2009  . PARTIAL KNEE ARTHROPLASTY Left 12/26/2014   Procedure: UNICOMPARTMENTAL KNEE;  Surgeon: Marchia Bond, MD;  Location: [place];  Service: Orthopedics;  Laterality: Left;  . PATCH ANGIOPLASTY Right 05/17/2015   Procedure: VEIN PATCH ANGIOPLASTY ILEOFEMORAL ARTERY;  Surgeon: Angelia Mould, MD;  Location: [place];  Service: Vascular;  Laterality: Right;  . PERCUTANEOUS STENT INTERVENTION N/A 05/10/2012   Procedure: PERCUTANEOUS STENT INTERVENTION;  Surgeon: Angelia Mould, MD;  Location: [facility] CATH LAB;  Service: Cardiovascular;  Laterality: N/A;  . PERIPHERAL VASCULAR CATHETERIZATION N/A 03/23/2015   Procedure: Abdominal Aortogram;  Surgeon: Angelia Mould, MD;  Location: [place] CV LAB;  Service: Cardiovascular;  Laterality: N/A;  . STENTS     PLACED IN ??BOTH LEGS   2013?  . TEE WITHOUT CARDIOVERSION N/A 03/06/2017   Procedure: TRANSESOPHAGEAL ECHOCARDIOGRAM (TEE);  Surgeon: Larey Dresser, MD;  Location: [facility] ENDOSCOPY;  Service: Cardiovascular;  Laterality: N/A;  . TEE WITHOUT CARDIOVERSION N/A 05/08/2017   Procedure: TRANSESOPHAGEAL ECHOCARDIOGRAM (TEE);  Surgeon: Minna Merritts, MD;  Location: [facility] ORS;  Service: Cardiovascular;  Laterality: N/A;    Current Outpatient Medications  Medication Sig Dispense Refill  . amiodarone (PACERONE) 200 MG tablet Take 200 mg by mouth daily.    Marland Kitchen apixaban (ELIQUIS) 5 MG TABS tablet Take 1 tablet (5 mg total) by mouth 2 (two) times daily. 60 tablet 3  . furosemide (LASIX) 40 MG tablet Take 1 tablet (40 mg total) by mouth 2 (two) times daily. 60 tablet 6  . insulin  glargine (LANTUS) 100 UNIT/ML injection Inject 0.1 mLs (10 Units total) into the skin daily. (Patient taking differently: Inject 13 Units into the skin at bedtime. ) 10 mL 11  . LORazepam (ATIVAN) 1 MG tablet Take 1 tablet (1 mg total)  by mouth every 12 (twelve) hours as needed for anxiety. 30 tablet 0  . spironolactone (ALDACTONE) 25 MG tablet Take 1 tablet (25 mg total) by mouth daily. 30 tablet 0  . zolpidem (AMBIEN) 10 MG tablet Take 10 mg by mouth at bedtime.     Marland Kitchen AFLURIA QUADRIVALENT 0.5 ML injection TO BE ADMINISTERED BY PHARMACIST FOR IMMUNIZATION  0   No current facility-administered medications for this visit.    Facility-Administered Medications Ordered in Other Visits  Medication Dose Route Frequency Provider Last Rate Last Dose  . heparin lock flush 100 unit/mL  500 Units Intracatheter Once PRN Curt Bears, MD      . sodium chloride flush (NS) 0.9 % injection 10 mL  10 mL Intracatheter PRN Curt Bears, MD   10 mL at 10/13/16 1618  . sodium chloride flush (NS) 0.9 % injection 10 mL  10 mL Intracatheter PRN Curt Bears, MD      . sodium chloride flush (NS) 0.9 % injection 10 mL  10 mL Intracatheter PRN Curt Bears, MD   10 mL at 11/26/16 1544     Allergies:   Cialis [tadalafil]   Social History:  The patient  reports that he has quit smoking. He smoked 0.00 packs per day for 0.00 years. he has never used smokeless tobacco. He reports that he drinks alcohol. He reports that he does not use drugs.   Family History:   family history includes Diabetes in his mother; Heart attack in his mother; Heart disease in his mother; Hyperlipidemia in his mother and sister; Hypertension in his father and mother; Stroke in his sister.    Review of Systems: Review of Systems  Constitutional: Positive for malaise/fatigue.  Respiratory: Negative.   Cardiovascular: Negative.   Gastrointestinal: Negative.   Musculoskeletal: Negative.   Neurological: Negative.   Psychiatric/Behavioral: Negative.   All other systems reviewed and are negative.    PHYSICAL EXAM: VS:  BP 113/71 (BP Location: Left Arm, Patient Position: Sitting, Cuff Size: Normal)   Pulse 77   Ht 6\' 1"  (1.854 m)   Wt 181 lb 8 oz (82.3  kg)   BMI 23.95 kg/m  , BMI Body mass index is 23.95 kg/m.  No significant change from previous exam GEN: Well nourished, well developed, in no acute distress  HEENT: normal  Neck: no JVD, carotid bruits, or masses Cardiac: irregularly irregular,no murmurs, rubs, or gallops,no edema  Respiratory:  clear to auscultation bilaterally, normal work of breathing GI: soft, nontender, nondistended, + BS MS: no deformity or atrophy  Skin: warm and dry, no rash Neuro:  Strength and sensation are intact Psych: euthymic mood, full affect    Recent Labs: 10/21/2016: TSH 0.234 03/01/2017: B Natriuretic Peptide 503.1 03/10/2017: Magnesium 2.1 06/19/2017: ALT 14; BUN 12.6; Creatinine 1.3; HGB 10.1; Platelets 128; Potassium 4.1; Sodium 137    Lipid Panel Lab Results  Component Value Date   CHOL 121 10/22/2016   HDL 24 (L) 10/22/2016   LDLCALC 53 10/22/2016   TRIG 219 (H) 10/22/2016      Wt Readings from Last 3 Encounters:  07/03/17 181 lb 8 oz (82.3 kg)  06/23/17 183 lb 3.2 oz (83.1 kg)  06/01/17 174 lb (78.9 kg)  ASSESSMENT AND PLAN:  Essential hypertension, benign - Plan: EKG 12-Lead Blood pressure is well controlled on today's visit. No changes made to the medications. 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, tachycardia mediated Echocardiogram ordered now that he is back to normal sinus rhythm to document ejection fraction recovery and guide diuretic regimen May be able to decrease Lasix down to daily  Mixed hyperlipidemia - Plan: EKG 12-Lead Total cholesterol 120, LDL 53  Paroxysmal atrial fibrillation (H[facility]) - Plan: EKG 12-Lead Previously with Thrombus in the left atrial appendage Converted on his own , prior cardioversion canceled  Type 2 diabetes mellitus with other circulatory complication, unspecified long term insulin use  status ([place]) - Plan: EKG 12-Lead Numbers previously improved with weight loss With low carbohydrate diet  Chronic obstructive pulmonary disease, unspecified COPD type ([place]) - Plan: EKG 12-Lead Stable, denies any significant shortness of breath  Small cell lung carcinoma, left ([place]) - Plan: EKG 12-Lead Finished radiation per the patient, completed chemotherapy Surveillance imaging in the future Stable  Malignant neoplasm of hilus of left lung ([place]) - Plan: EKG 12-Lead Details as above Stable  Anemia, unspecified type Previous history of anemia requiring transfusion    Total encounter time more than 25 minutes  Greater than 50% was spent in counseling and coordination of care with the patient   Disposition:   F/U  6 months   Orders Placed This Encounter  Procedures  . EKG 12-Lead  . ECHOCARDIOGRAM COMPLETE     Signed, Esmond Plants, M.D., Ph.D. 07/03/2017  [place], [place]

## 2017-07-03 ENCOUNTER — Encounter: Payer: Self-pay | Admitting: Cardiovascular Disease

## 2017-07-03 ENCOUNTER — Other Ambulatory Visit: Payer: Self-pay

## 2017-07-03 ENCOUNTER — Ambulatory Visit (INDEPENDENT_AMBULATORY_CARE_PROVIDER_SITE_OTHER): Payer: BLUE CROSS/BLUE SHIELD | Admitting: Cardiovascular Disease

## 2017-07-03 VITALS — BP 113/71 | HR 77 | Ht 73.0 in | Wt 181.5 lb

## 2017-07-03 DIAGNOSIS — I6529 Occlusion and stenosis of unspecified carotid artery: Secondary | ICD-10-CM | POA: Diagnosis not present

## 2017-07-03 DIAGNOSIS — I739 Peripheral vascular disease, unspecified: Secondary | ICD-10-CM | POA: Diagnosis not present

## 2017-07-03 DIAGNOSIS — I5033 Acute on chronic diastolic (congestive) heart failure: Secondary | ICD-10-CM

## 2017-07-03 DIAGNOSIS — I480 Paroxysmal atrial fibrillation: Secondary | ICD-10-CM

## 2017-07-03 DIAGNOSIS — I10 Essential (primary) hypertension: Secondary | ICD-10-CM

## 2017-07-03 DIAGNOSIS — C3492 Malignant neoplasm of unspecified part of left bronchus or lung: Secondary | ICD-10-CM

## 2017-07-03 DIAGNOSIS — E1159 Type 2 diabetes mellitus with other circulatory complications: Secondary | ICD-10-CM

## 2017-07-03 DIAGNOSIS — I236 Thrombosis of atrium, auricular appendage, and ventricle as current complications following acute myocardial infarction: Secondary | ICD-10-CM

## 2017-07-03 DIAGNOSIS — J441 Chronic obstructive pulmonary disease with (acute) exacerbation: Secondary | ICD-10-CM | POA: Diagnosis not present

## 2017-07-03 DIAGNOSIS — E782 Mixed hyperlipidemia: Secondary | ICD-10-CM

## 2017-07-03 NOTE — Patient Instructions (Addendum)
Medication Instructions:   No medication changes made  Labwork:  No new labs needed  Testing/Procedures:  We will place an order for echocardiogram for cardiomyopathy and atrial fib   Follow-Up: It was a pleasure seeing you in the office today. Please call us if you have new issues that need to be addressed before your next appt.  [phone]  Your physician wants you to follow-up in: 6 months.  You will receive a reminder letter in the mail two months in advance. If you don't receive a letter, please call our office to schedule the follow-up appointment.  If you need a refill on your cardiac medications before your next appointment, please call your pharmacy.

## 2017-07-08 ENCOUNTER — Ambulatory Visit (HOSPITAL_COMMUNITY)
Admission: RE | Admit: 2017-07-08 | Discharge: 2017-07-08 | Disposition: A | Discharge status: Home / Self Care | Payer: BLUE CROSS/BLUE SHIELD | Source: Ambulatory Visit | Attending: Vascular Surgery | Admitting: Vascular Surgery

## 2017-07-08 ENCOUNTER — Ambulatory Visit (INDEPENDENT_AMBULATORY_CARE_PROVIDER_SITE_OTHER)
Admission: RE | Admit: 2017-07-08 | Discharge: 2017-07-08 | Disposition: A | Discharge status: Home / Self Care | Payer: BLUE CROSS/BLUE SHIELD | Source: Ambulatory Visit | Attending: Vascular Surgery | Admitting: Vascular Surgery

## 2017-07-08 ENCOUNTER — Ambulatory Visit (INDEPENDENT_AMBULATORY_CARE_PROVIDER_SITE_OTHER): Payer: BLUE CROSS/BLUE SHIELD | Admitting: Vascular Surgery

## 2017-07-08 ENCOUNTER — Encounter: Payer: Self-pay | Admitting: Vascular Surgery

## 2017-07-08 VITALS — BP 108/69 | HR 81 | Temp 98.5°F | Resp 16 | Ht 73.0 in | Wt 180.5 lb

## 2017-07-08 DIAGNOSIS — R0989 Other specified symptoms and signs involving the circulatory and respiratory systems: Secondary | ICD-10-CM | POA: Diagnosis not present

## 2017-07-08 DIAGNOSIS — I739 Peripheral vascular disease, unspecified: Secondary | ICD-10-CM | POA: Diagnosis not present

## 2017-07-08 DIAGNOSIS — E1151 Type 2 diabetes mellitus with diabetic peripheral angiopathy without gangrene: Secondary | ICD-10-CM | POA: Insufficient documentation

## 2017-07-08 DIAGNOSIS — I6523 Occlusion and stenosis of bilateral carotid arteries: Secondary | ICD-10-CM

## 2017-07-08 DIAGNOSIS — I6522 Occlusion and stenosis of left carotid artery: Secondary | ICD-10-CM | POA: Insufficient documentation

## 2017-07-08 DIAGNOSIS — I6529 Occlusion and stenosis of unspecified carotid artery: Secondary | ICD-10-CM

## 2017-07-08 DIAGNOSIS — Z87891 Personal history of nicotine dependence: Secondary | ICD-10-CM | POA: Insufficient documentation

## 2017-07-08 DIAGNOSIS — E785 Hyperlipidemia, unspecified: Secondary | ICD-10-CM | POA: Insufficient documentation

## 2017-07-08 DIAGNOSIS — R9389 Abnormal findings on diagnostic imaging of other specified body structures: Secondary | ICD-10-CM | POA: Insufficient documentation

## 2017-07-08 DIAGNOSIS — I10 Essential (primary) hypertension: Secondary | ICD-10-CM | POA: Insufficient documentation

## 2017-07-08 LAB — VAS US CAROTID
LCCADDIAS: 29 cm/s
LCCAPSYS: 83 cm/s
LEFT ECA DIAS: -44 cm/s
LICADDIAS: -16 cm/s
LICADSYS: -43 cm/s
LICAPDIAS: -70 cm/s
LICAPSYS: -185 cm/s
Left CCA dist sys: 80 cm/s
Left CCA prox dias: 16 cm/s
RCCAPSYS: 66 cm/s
RIGHT CCA MID DIAS: 17 cm/s
RIGHT ECA DIAS: -19 cm/s
Right CCA prox dias: 16 cm/s
Right cca dist sys: -76 cm/s

## 2017-07-08 NOTE — Progress Notes (Signed)
Patient name: Jesse Avila MRN: [id number] DOB: 1951/05/09 Sex: male  REASON FOR VISIT:   Follow-up of peripheral vascular disease.  HPI:   Jesse Avila is a pleasant 66 y.o. male who I last saw on 09/12/2015.  He had presented with progressive ischemia of the right lower extremity and severe rest pain.  He had multilevel arterial occlusive disease he underwent extensive iliofemoral endarterectomy with vein patch angioplasty and a right femoral below knee pop bypass with a prosthetic graft on 05/17/2015.  The saphenous vein on the right was not adequate.  He had single-vessel runoff via a diseased peroneal artery.  When he was last seen the right femoropopliteal bypass graft was occluded.  His ABI at that time was 49% on the right.  Since I saw him last he denies any claudication, rest pain, or nonhealing ulcers.  I have also been following him with carotid disease.  At the time of his last visit his right carotid endarterectomy site was widely patent.  He had a 60-79% left carotid stenosis.  Since I saw him last, he denies any history of stroke, TIAs, expressive or receptive aphasia, or amaurosis fugax.  Since I saw him last, he was diagnosed with a stage II lung cancer which was treated with chemotherapy and radiation therapy and currently is in remission.  Past Medical History:  Diagnosis Date  . Atrial fibrillation ([place])   . Carotid artery occlusion   . DJD (degenerative joint disease) of knee   . Essential hypertension, benign    takes Benicar daily  . Goals of care, counseling/discussion 09/18/2016  . History of colon polyps    benign  . History of gout   . History of radiation therapy 09/30/16-11/10/16   left lung 60 Gy in 30 fractions  . Hyperlipidemia    takes Fenofibrate and Crestor daily  . Insomnia    takes Ambien nightly as needed  . Joint pain   . Lung cancer ([place])   . Nocturia   . Peripheral vascular disease ([place])   . Primary localized osteoarthritis of left knee  12/26/2014  . Stroke [facility]) 2015?   takes Plavix daily as well as Pletal,not on plavix at present 05/15/15  . 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 Diabetes Mother   . Hypertension Mother   . Heart disease Mother        Coronary Artery Bypass Graft  . Hyperlipidemia Mother   . Heart attack Mother   . Hypertension Father   . Hyperlipidemia Sister   . Stroke Sister     SOCIAL HISTORY: Social History   Tobacco Use  . Smoking status: Former Smoker    Packs/day: 0.00    Years: 0.00    Pack years: 0.00  . Smokeless tobacco: Never Used  . Tobacco comment: quit smoking 2008  Substance Use Topics  . Alcohol use: Yes    Alcohol/week: 0.0 oz    Comment: beer- occasionally    Allergies  Allergen Reactions  . Cialis [Tadalafil] Other (See Comments)    Headache     Current Outpatient Medications  Medication Sig Dispense Refill  . AFLURIA QUADRIVALENT 0.5 ML injection TO BE ADMINISTERED BY PHARMACIST FOR IMMUNIZATION  0  . amiodarone (PACERONE) 200 MG tablet Take 200 mg by mouth daily.    Marland Kitchen apixaban (ELIQUIS)  5 MG TABS tablet Take 1 tablet (5 mg total) by mouth 2 (two) times daily. 60 tablet 3  . furosemide (LASIX) 40 MG tablet Take 1 tablet (40 mg total) by mouth 2 (two) times daily. 60 tablet 6  . insulin glargine (LANTUS) 100 UNIT/ML injection Inject 0.1 mLs (10 Units total) into the skin daily. (Patient taking differently: Inject 13 Units into the skin at bedtime. ) 10 mL 11  . LORazepam (ATIVAN) 1 MG tablet Take 1 tablet (1 mg total) by mouth every 12 (twelve) hours as needed for anxiety. 30 tablet 0  . spironolactone (ALDACTONE) 25 MG tablet Take 1 tablet (25 mg total) by mouth daily. 30 tablet 0  . zolpidem (AMBIEN) 10 MG tablet Take 10 mg by mouth at bedtime.      No current facility-administered medications  for this visit.    Facility-Administered Medications Ordered in Other Visits  Medication Dose Route Frequency Provider Last Rate Last Dose  . heparin lock flush 100 unit/mL  500 Units Intracatheter Once PRN Curt Bears, MD      . sodium chloride flush (NS) 0.9 % injection 10 mL  10 mL Intracatheter PRN Curt Bears, MD   10 mL at 10/13/16 1618  . sodium chloride flush (NS) 0.9 % injection 10 mL  10 mL Intracatheter PRN Curt Bears, MD      . sodium chloride flush (NS) 0.9 % injection 10 mL  10 mL Intracatheter PRN Curt Bears, MD   10 mL at 11/26/16 1544    REVIEW OF SYSTEMS:  [X]  denotes positive finding, [ ]  denotes negative finding Cardiac  Comments:  Chest pain or chest pressure:    Shortness of breath upon exertion:    Short of breath when lying flat:    Irregular heart rhythm:        Vascular    Pain in calf, thigh, or hip brought on by ambulation:    Pain in feet at night that wakes you up from your sleep:     Blood clot in your veins:    Leg swelling:         Pulmonary    Oxygen at home:    Productive cough:     Wheezing:         Neurologic    Sudden weakness in arms or legs:     Sudden numbness in arms or legs:     Sudden onset of difficulty speaking or slurred speech:    Temporary loss of vision in one eye:     Problems with dizziness:         Gastrointestinal    Blood in stool:     Vomited blood:         Genitourinary    Burning when urinating:     Blood in urine:        Psychiatric    Major depression:         Hematologic    Bleeding problems:    Problems with blood clotting too easily:        Skin    Rashes or ulcers:        Constitutional    Fever or chills:     PHYSICAL EXAM:   Vitals:   07/08/17 1145 07/08/17 1148  BP: 104/74 108/69  Pulse: 81   Resp: 16   Temp: 98.5 F (36.9 C)   TempSrc: Oral   SpO2: 96%   Weight: 180 lb 8 oz (81.9 kg)   Height:  6\' 1"  (1.854 m)     GENERAL: The patient is a well-nourished  male, in no acute distress. The vital signs are documented above. CARDIAC: There is a regular rate and rhythm.  VASCULAR: I do not detect carotid bruits. He has palpable femoral pulses.  I cannot palpate pedal pulses. Both feet are warm and well perfused. He has no significant lower extremity swelling. PULMONARY: There is good air exchange bilaterally without wheezing or rales. ABDOMEN: Soft and non-tender with normal pitched bowel sounds.  MUSCULOSKELETAL: There are no major deformities or cyanosis. NEUROLOGIC: No focal weakness or paresthesias are detected. SKIN: There are no ulcers or rashes noted. PSYCHIATRIC: The patient has a normal affect.  DATA:    CAROTID DUPLEX: I have independently interpreted his carotid duplex scan.  On the right side the right carotid endarterectomy site is widely patent without evidence of restenosis.  On the left side he has a 60-79% left carotid stenosis.  Both vertebral arteries are patent with antegrade flow.  ARTERIAL DOPPLER STUDY: I have independently interpreted his arterial Doppler study.  On the right side he has a monophasic dorsalis pedis signal with an ABI of 68%.  Toe pressure is 29 mmHg.  On the left side he has a monophasic dorsalis pedis and posterior tibial signal.  ABIs 77%.  Toe pressure is 31 mmHg.  MEDICAL ISSUES:   ASYMPTOMATIC 60-79% LEFT CAROTID STENOSIS: His right carotid endarterectomy site is widely patent.  He has an asymptomatic 60-79% left carotid stenosis.  He is not a smoker.  He is not on aspirin since he is on Eliquis.  He understands we would not consider left carotid endarterectomy unless he became symptomatic with the stenosis progressed to greater than 80%.  I have ordered a follow-up carotid duplex scan in 6 months and I will see him back at that time.  He knows to call sooner if he has problems.  PERIPHERAL VASCULAR DISEASE: His ABI on the right has improved.  He is asymptomatic.  I have encouraged him to stay as  active as possible.  We have discussed the importance of nutrition.  Fortunately he is not a smoker.  I have ordered follow-up ABIs in 1 year.  STAGE II LUNG CANCER: We have discussed the importance of nutrition as I think this is critically important for patients with cancer.  I have encouraged him to eat as many vegetables as he can.  Deitra Mayo [facility] [phone]

## 2017-07-09 NOTE — Addendum Note (Signed)
Addended by: Lianne Cure A on: 07/09/2017 11:33 AM   Modules accepted: Orders

## 2017-07-14 ENCOUNTER — Other Ambulatory Visit: Payer: BLUE CROSS/BLUE SHIELD

## 2017-07-17 ENCOUNTER — Other Ambulatory Visit: Payer: Self-pay

## 2017-07-17 ENCOUNTER — Emergency Department (HOSPITAL_COMMUNITY): Payer: BLUE CROSS/BLUE SHIELD

## 2017-07-17 ENCOUNTER — Encounter (HOSPITAL_COMMUNITY): Payer: Self-pay | Admitting: Emergency Medicine

## 2017-07-17 ENCOUNTER — Emergency Department (HOSPITAL_COMMUNITY)
Admission: EM | Admit: 2017-07-17 | Discharge: 2017-07-17 | Disposition: A | Discharge status: Home / Self Care | Payer: BLUE CROSS/BLUE SHIELD | Attending: Emergency Medicine | Admitting: Emergency Medicine

## 2017-07-17 DIAGNOSIS — M546 Pain in thoracic spine: Secondary | ICD-10-CM | POA: Diagnosis not present

## 2017-07-17 DIAGNOSIS — Z87891 Personal history of nicotine dependence: Secondary | ICD-10-CM | POA: Insufficient documentation

## 2017-07-17 DIAGNOSIS — Y999 Unspecified external cause status: Secondary | ICD-10-CM | POA: Diagnosis not present

## 2017-07-17 DIAGNOSIS — I5033 Acute on chronic diastolic (congestive) heart failure: Secondary | ICD-10-CM | POA: Diagnosis not present

## 2017-07-17 DIAGNOSIS — Y92009 Unspecified place in unspecified non-institutional (private) residence as the place of occurrence of the external cause: Secondary | ICD-10-CM | POA: Insufficient documentation

## 2017-07-17 DIAGNOSIS — X500XXA Overexertion from strenuous movement or load, initial encounter: Secondary | ICD-10-CM | POA: Diagnosis not present

## 2017-07-17 DIAGNOSIS — Z96652 Presence of left artificial knee joint: Secondary | ICD-10-CM | POA: Diagnosis not present

## 2017-07-17 DIAGNOSIS — E119 Type 2 diabetes mellitus without complications: Secondary | ICD-10-CM | POA: Diagnosis not present

## 2017-07-17 DIAGNOSIS — I110 Hypertensive heart disease with heart failure: Secondary | ICD-10-CM | POA: Diagnosis not present

## 2017-07-17 DIAGNOSIS — Z794 Long term (current) use of insulin: Secondary | ICD-10-CM | POA: Insufficient documentation

## 2017-07-17 DIAGNOSIS — Z79899 Other long term (current) drug therapy: Secondary | ICD-10-CM | POA: Diagnosis not present

## 2017-07-17 DIAGNOSIS — Y9389 Activity, other specified: Secondary | ICD-10-CM | POA: Insufficient documentation

## 2017-07-17 DIAGNOSIS — T148XXA Other injury of unspecified body region, initial encounter: Secondary | ICD-10-CM | POA: Insufficient documentation

## 2017-07-17 DIAGNOSIS — R0789 Other chest pain: Secondary | ICD-10-CM

## 2017-07-17 DIAGNOSIS — J449 Chronic obstructive pulmonary disease, unspecified: Secondary | ICD-10-CM | POA: Diagnosis not present

## 2017-07-17 LAB — BASIC METABOLIC PANEL
ANION GAP: 12 (ref 5–15)
BUN: 20 mg/dL (ref 6–20)
CALCIUM: 9.6 mg/dL (ref 8.9–10.3)
CHLORIDE: 94 mmol/L — AB (ref 101–111)
CO2: 28 mmol/L (ref 22–32)
CREATININE: 1.5 mg/dL — AB (ref 0.61–1.24)
GFR calc non Af Amer: 47 mL/min — ABNORMAL LOW (ref >60)
GFR, EST AFRICAN AMERICAN: 55 mL/min — AB (ref >60)
GLUCOSE: 309 mg/dL — AB (ref 65–99)
Potassium: 3.5 mmol/L (ref 3.5–5.1)
Sodium: 134 mmol/L — ABNORMAL LOW (ref 135–145)

## 2017-07-17 LAB — I-STAT TROPONIN, ED
Troponin i, poc: 0.01 ng/mL (ref 0.00–0.08)
Troponin i, poc: 0.01 ng/mL (ref 0.00–0.08)

## 2017-07-17 LAB — CBC
HCT: 32.9 % — ABNORMAL LOW (ref 39.0–52.0)
HEMOGLOBIN: 10.9 g/dL — AB (ref 13.0–17.0)
MCH: 33.3 pg (ref 26.0–34.0)
MCHC: 33.1 g/dL (ref 30.0–36.0)
MCV: 100.6 fL — AB (ref 78.0–100.0)
Platelets: 154 10*3/uL (ref 150–400)
RBC: 3.27 MIL/uL — ABNORMAL LOW (ref 4.22–5.81)
RDW: 15.1 % (ref 11.5–15.5)
WBC: 9.6 10*3/uL (ref 4.0–10.5)

## 2017-07-17 MED ORDER — CYCLOBENZAPRINE HCL 10 MG PO TABS
10.0000 mg | ORAL_TABLET | Freq: Once | ORAL | Status: AC
  Administered 2017-07-17: 10 mg via ORAL
  Filled 2017-07-17: qty 1

## 2017-07-17 MED ORDER — ACETAMINOPHEN 500 MG PO TABS
1000.0000 mg | ORAL_TABLET | Freq: Once | ORAL | Status: AC
  Administered 2017-07-17: 1000 mg via ORAL
  Filled 2017-07-17: qty 2

## 2017-07-17 MED ORDER — CYCLOBENZAPRINE HCL 10 MG PO TABS: 10.0000 mg | ORAL_TABLET | Freq: Two times a day (BID) | ORAL | 0 refills | Status: DC | PRN

## 2017-07-17 NOTE — ED Provider Notes (Signed)
[facility] DE[facility] Provider Note   CSN: [id number] Arrival date & time: 07/17/17  0115     History   Chief Complaint Chief Complaint  Patient presents with  . Chest Pain    HPI  Jesse Avila is a 66 y.o. Male with a history of CAD, HLD, HTN, DM2, Afib, and stage 4 lung cancer currently not on treatment, who presents complaining of pain radiating across the lower chest wall, and across the midthoracic back. Patient reports last night he was trying to lift a large life Christmas tree into his house, and since then he's had this constant dull aching pain, reports it feels like his muscles are tight. Patient reports its painful to take deep breath, makes him feel slightly short of breath. Patient denies any radiation of the pain to the neck, jaw or arm, no nausea, vomiting, diaphoresis or dizziness. Denies sensation of ripping or tearing pain. Patient reports this does not feel like pain he had in the past with his MI. Patient reports pain is made worse with movement or position changes. Patient took a baby aspirin at home, with mild improvement in pain. Patient reports he tried to just go to bed and see if it would get better, but had a hard time getting comfortable overnight. Denies any weakness, numbness or paresthesias in his arms.      Past Medical History:  Diagnosis Date  . Atrial fibrillation ([place])   . Carotid artery occlusion   . DJD (degenerative joint disease) of knee   . Essential hypertension, benign    takes Benicar daily  . Goals of care, counseling/discussion 09/18/2016  . History of colon polyps    benign  . History of gout   . History of radiation therapy 09/30/16-11/10/16   left lung 60 Gy in 30 fractions  . Hyperlipidemia    takes Fenofibrate and Crestor daily  . Insomnia    takes Ambien nightly as needed  . Joint pain   . Lung cancer ([place])   . Nocturia   . Peripheral vascular disease ([place])   . Primary localized  osteoarthritis of left knee 12/26/2014  . Stroke [facility]) 2015?   takes Plavix daily as well as Pletal,not on plavix at present 05/15/15  . 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 Thrombus of left atrial appendage following myocardial infarction ([place])   . AKI (acute kidney injury) ([place]) 03/05/2017  . HCAP (healthcare-associated pneumonia)   . Transaminitis   . Pleural effusion on left   . Postobstructive pneumonia 03/01/2017  . Acute respiratory failure with hypoxia ([place]) 03/01/2017  . Lactic acidosis 03/01/2017  . CAP (community acquired pneumonia) 02/02/2017  . Acute on chronic diastolic CHF (congestive heart failure) ([place]) 02/02/2017  . Port catheter in place 11/05/2016  . Malignant neoplasm of lung ([place])   . Anemia   . Atrial fibrillation ([place]) 10/21/2016  . Small cell lung carcinoma, left ([place]) 09/18/2016  . Goals of care, counseling/discussion 09/18/2016  . Encounter for antineoplastic chemotherapy 09/18/2016  . Hilar lymphadenopathy   . COPD (chronic obstructive pulmonary disease) ([place]) 09/03/2016  . PAD (peripheral artery disease) ([place]) 05/17/2015  . Primary localized osteoarthritis of left knee 12/26/2014  . Knee osteoarthritis 12/26/2014  . Carotid stenosis 04/21/2014  . History of stroke 04/21/2014  . Carotid stenosis with  cerebral infarction less than 8 weeks ago 04/16/2014  . Diabetes mellitus, type 2 ([place]) 04/14/2014  . Obesity 04/14/2014  . Insomnia 10/18/2013  . Preop cardiovascular exam 12/12/2011  . DJD (degenerative joint disease) of knee   . ED (erectile dysfunction)   . GERD (gastroesophageal reflux disease)   . Essential hypertension, benign   . Peripheral vascular disease ([place])   . Hyperlipidemia   . Critical lower limb ischemia 11/19/2011    Past Surgical History:  Procedure  Laterality Date  . ABDOMINAL AORTAGRAM N/A 03/29/2012   Procedure: ABDOMINAL Maxcine Ham;  Surgeon: Angelia Mould, MD;  Location: [facility] CATH LAB;  Service: Cardiovascular;  Laterality: N/A;  . CARDIOVERSION N/A 05/08/2017   Procedure: CARDIOVERSION;  Surgeon: Minna Merritts, MD;  Location: [facility] ORS;  Service: Cardiovascular;  Laterality: N/A;  . COLONOSCOPY    . ENDARTERECTOMY Right 05/09/2014   Procedure: RIGHT CAROTID ENDARTERECTOMY WITH PATCH ANGIOPLASTY;  Surgeon: Conrad Barrington, MD;  Location: [facility];  Service: Vascular;  Laterality: Right;  . ENDARTERECTOMY FEMORAL Right 05/17/2015   Procedure: ENDARTERECTOMY FEMORAL;  Surgeon: Angelia Mould, MD;  Location: [place];  Service: Vascular;  Laterality: Right;  . ENDOBRONCHIAL ULTRASOUND Bilateral 09/08/2016   Procedure: ENDOBRONCHIAL ULTRASOUND;  Surgeon: Rigoberto Noel, MD;  Location: [facility] ENDOSCOPY;  Service: Cardiopulmonary;  Laterality: Bilateral;  . FEMORAL-POPLITEAL BYPASS GRAFT Right 05/17/2015   Procedure: BYPASS GRAFT FEMORAL-BELOW KNEE POPLITEAL ARTERY, using gortex propaten graft 6 mm x 80 cm;  Surgeon: Angelia Mould, MD;  Location: [place];  Service: Vascular;  Laterality: Right;  . IR GENERIC HISTORICAL  09/25/2016   IR FLUORO GUIDE PORT INSERTION RIGHT 09/25/2016 Markus Daft, MD [facility]-INTERV RAD  . IR GENERIC HISTORICAL  09/25/2016   IR US GUIDE VASC ACCESS RIGHT 09/25/2016 Markus Daft, MD [facility]-INTER[facility]  . KNEE ARTHROSCOPY Right 11/2009  . LOWER EXTREMITY ANGIOGRAM Bilateral 03/23/2015   Procedure: Lower Extremity Angiogram;  Surgeon: Angelia Mould, MD;  Location: [facility] CV LAB;  Service: Cardiovascular;  Laterality: Bilateral;  . MENISCUS REPAIR  11/2009  . PARTIAL KNEE ARTHROPLASTY Left 12/26/2014   Procedure: UNICOMPARTMENTAL KNEE;  Surgeon: Marchia Bond, MD;  Location: [place];  Service: Orthopedics;  Laterality: Left;  . PATCH ANGIOPLASTY Right 05/17/2015   Procedure: VEIN PATCH ANGIOPLASTY ILEOFEMORAL ARTERY;   Surgeon: Angelia Mould, MD;  Location: [place];  Service: Vascular;  Laterality: Right;  . PERCUTANEOUS STENT INTERVENTION N/A 05/10/2012   Procedure: PERCUTANEOUS STENT INTERVENTION;  Surgeon: Angelia Mould, MD;  Location: [facility] CATH LAB;  Service: Cardiovascular;  Laterality: N/A;  . PERIPHERAL VASCULAR CATHETERIZATION N/A 03/23/2015   Procedure: Abdominal Aortogram;  Surgeon: Angelia Mould, MD;  Location: [place] CV LAB;  Service: Cardiovascular;  Laterality: N/A;  . STENTS     PLACED IN ??BOTH LEGS   2013?  . TEE WITHOUT CARDIOVERSION N/A 03/06/2017   Procedure: TRANSESOPHAGEAL ECHOCARDIOGRAM (TEE);  Surgeon: Larey Dresser, MD;  Location: [facility] ENDOSCOPY;  Service: Cardiovascular;  Laterality: N/A;  . TEE WITHOUT CARDIOVERSION N/A 05/08/2017   Procedure: TRANSESOPHAGEAL ECHOCARDIOGRAM (TEE);  Surgeon: Minna Merritts, MD;  Location: [facility] ORS;  Service: Cardiovascular;  Laterality: N/A;       Home Medications    Prior to Admission medications   Medication Sig Start Date End Date Taking? Authorizing Provider  AFLURIA QUADRIVALENT 0.5 ML injection TO BE ADMINISTERED BY PHARMACIST FOR IMMUNIZATION 05/18/17   [provider]  amiodarone (PACERONE) 200 MG tablet Take 200 mg by  mouth daily.    [provider]  apixaban (ELIQUIS) 5 MG TABS tablet Take 1 tablet (5 mg total) by mouth 2 (two) times daily. 03/10/17   Regalado, Belkys A, MD  furosemide (LASIX) 40 MG tablet Take 1 tablet (40 mg total) by mouth 2 (two) times daily. 03/23/17   Minna Merritts, MD  insulin glargine (LANTUS) 100 UNIT/ML injection Inject 0.1 mLs (10 Units total) into the skin daily. Patient taking differently: Inject 13 Units into the skin at bedtime.  03/11/17   Regalado, Belkys A, MD  LORazepam (ATIVAN) 1 MG tablet Take 1 tablet (1 mg total) by mouth every 12 (twelve) hours as needed for anxiety. 03/10/17   Regalado, Belkys A, MD  spironolactone (ALDACTONE) 25 MG tablet Take 1 tablet  (25 mg total) by mouth daily. 03/11/17   Regalado, Belkys A, MD  zolpidem (AMBIEN) 10 MG tablet Take 10 mg by mouth at bedtime.  01/26/15   [provider]    Family History Family History  Problem Relation Age of Onset  . Diabetes Mother   . Hypertension Mother   . Heart disease Mother        Coronary Artery Bypass Graft  . Hyperlipidemia Mother   . Heart attack Mother   . Hypertension Father   . Hyperlipidemia Sister   . Stroke Sister     Social History Social History   Tobacco Use  . Smoking status: Former Smoker    Packs/day: 0.00    Years: 0.00    Pack years: 0.00  . Smokeless tobacco: Never Used  . Tobacco comment: quit smoking 2008  Substance Use Topics  . Alcohol use: Yes    Alcohol/week: 0.0 oz    Comment: beer- occasionally  . Drug use: No     Allergies   Cialis [tadalafil]   Review of Systems Review of Systems  Constitutional: Negative for chills and fever.  HENT: Negative for congestion, rhinorrhea and sore throat.   Eyes: Negative for photophobia and visual disturbance.  Respiratory: Positive for shortness of breath. Negative for cough.   Cardiovascular: Positive for chest pain. Negative for palpitations and leg swelling.  Gastrointestinal: Negative for abdominal pain, constipation, diarrhea, nausea and vomiting.  Genitourinary: Negative for dysuria.     Physical Exam Updated Vital Signs BP 126/79   Pulse 89   Temp 98.3 F (36.8 C) (Oral)   Resp (!) 29   Ht 6\' 1"  (1.854 m)   Wt 81.6 kg (180 lb)   SpO2 96%   BMI 23.75 kg/m   Physical Exam  Constitutional: He appears well-developed and well-nourished. No distress.  HENT:  Head: Normocephalic and atraumatic.  Eyes: Right eye exhibits no discharge. Left eye exhibits no discharge.  Neck: Normal range of motion. Neck supple.  C-spine nontender to palpation at midline or paraspinally, full normal range of motion of the neck  Cardiovascular: Normal rate, regular rhythm and normal  heart sounds.  Pulses:      Radial pulses are 2+ on the right side, and 2+ on the left side.       Posterior tibial pulses are 2+ on the right side, and 2+ on the left side.  Tenderness to palpation across the lower chest wall, no crepitus or palpable deformity  Pulmonary/Chest: Effort normal and breath sounds normal. No accessory muscle usage. No respiratory distress. He has no wheezes. He has no rhonchi. He has no rales.  Abdominal: Soft. Bowel sounds are normal. He exhibits no distension and no  mass. There is no tenderness. There is no guarding.  Musculoskeletal: Normal range of motion.       Right lower leg: He exhibits no edema.       Left lower leg: He exhibits no edema.  T-spine and L-spine nontender to palpation in midline, mild tenderness bilaterally over the thoracic back  Neurological: He is alert. Coordination normal.  Speech is clear, able to follow commands CN III-XII intact Normal strength in upper and lower extremities bilaterally including dorsiflexion and plantar flexion, strong and equal grip strength Sensation normal to light and sharp touch Moves extremities without ataxia, coordination intact  Skin: Skin is warm and dry. Capillary refill takes less than 2 seconds. He is not diaphoretic.  Psychiatric: He has a normal mood and affect. His behavior is normal.  Nursing note and vitals reviewed.    ED Treatments / Results  Labs (all labs ordered are listed, but only abnormal results are displayed) Labs Reviewed  BASIC METABOLIC PANEL - Abnormal; Notable for the following components:      Result Value   Sodium 134 (*)    Chloride 94 (*)    Glucose, Bld 309 (*)    Creatinine, Ser 1.50 (*)    GFR calc non Af Amer 47 (*)    GFR calc Af Amer 55 (*)    All other components within normal limits  CBC - Abnormal; Notable for the following components:   RBC 3.27 (*)    Hemoglobin 10.9 (*)    HCT 32.9 (*)    MCV 100.6 (*)    All other components within normal limits    I-STAT TROPONIN, ED  I-STAT TROPONIN, ED    EKG  EKG Interpretation  Date/Time:  Friday July 17 2017 07:47:56 EST Ventricular Rate:  91 PR Interval:  230 QRS Duration: 127 QT Interval:  420 QTC Calculation: 517 R Axis:   73 Text Interpretation:  Sinus or ectopic atrial rhythm Prolonged PR interval Nonspecific intraventricular conduction delay Anterior infarct, old no acute changes  Confirmed by Brantley Stage [phone]) on 07/17/2017 9:57:05 AM       Radiology Dg Chest 2 View  Result Date: 07/17/2017 CLINICAL DATA:  Chest pain radiating around the midchest region. EXAM: CHEST  2 VIEW COMPARISON:  03/03/2017, 06/19/2017 FINDINGS: Right jugular port extends into the low SVC. Right lung is clear. On the left, extensive curvilinear scarring as well as moderate volume loss, stable. No airspace consolidation. Normal pulmonary vasculature. No pleural effusions. Hilar and mediastinal contours are unchanged. IMPRESSION: No acute findings. Electronically Signed   By: Andreas Newport M.D.   On: 07/17/2017 02:55    Procedures Procedures (including critical care time)  Medications Ordered in ED Medications  acetaminophen (TYLENOL) tablet 1,000 mg (1,000 mg Oral Given 07/17/17 0745)  cyclobenzaprine (FLEXERIL) tablet 10 mg (10 mg Oral Given 07/17/17 0745)     Initial Impression / Assessment and Plan / ED Course  I have reviewed the triage vital signs and the nursing notes.  Pertinent labs & imaging results that were available during my care of the patient were reviewed by me and considered in my medical decision making (see chart for details).  Patient presents with bilateral lower chest pain and mid thoracic back pain immediately after lifting a large Christmas tree last night. Pain reproducible with palpation of chest wall and thoracic back. No midline spinal tenderness. CP made worse by movement and position changes, not exertional. Some pain with deep breath. No LE edema or tenderness,  low suspicion for DVT/PE. Pt is not hypoxic or tachycardiac. Mild tachypnea, but pt does not appear to be in distress, tachypnea likely due to musculoskeletal pain. Equal pulses in al extremities with no paraesthesias or neuro deficits, low suspicion for dissection. EKG w/o significant changes. Labs unremarkable with neg trop, Hgb stable, no leukocytosis, no electrolyte derangements requiring intervention. CXR shows post-radiation changes, no acute disease. Given reassuring work-up and story, pain likely musculoskeletal, will repeat trop and EKG, and treat with tylenol and muscle relaxer and reevaluate.  On reevaluation pt reports pain is down to 2/10, and he is able to move around more easily. Delta trop negative. Pt ambulated in hallway with no increased ain, no hypoxia or increased work of breathing. Pt stable for discharge home with strict return precautions and PCP follow up. RICE and tylenol and muscle relaxer for pain. Pt and wife express understanding and are in agreement with plan.  Pt discussed with Dr. Oleta Mouse, who is in agreement with plan.  Final Clinical Impressions(s) / ED Diagnoses   Final diagnoses:  Chest wall pain  Acute bilateral thoracic back pain  Muscle strain    ED Discharge Orders        Ordered    cyclobenzaprine (FLEXERIL) 10 MG tablet  2 times daily PRN     07/17/17 0955       Jacqlyn Larsen, PA-C 07/18/17 1411    Forde Dandy, MD 07/20/17 1400

## 2017-07-17 NOTE — ED Triage Notes (Signed)
Pt c/o bilateral cp radiating to is back with some SOB, denies nausea, vomiting or dizziness.

## 2017-07-17 NOTE — Discharge Instructions (Signed)
Your workup today is reassuring, pain likely musculoskeletal after lifting a large Christmas tree. He may take ibuprofen and Flexeril for pain. Take it easy over the next few days and rest. If you're having worsening chest pain, shortness of breath, dizziness, or other concerning symptoms please return to the ED for sooner evaluation. Otherwise please follow-up with your PCP early next week to ensure symptoms are continuing to improve.

## 2017-07-29 ENCOUNTER — Other Ambulatory Visit: Payer: Self-pay

## 2017-07-29 ENCOUNTER — Ambulatory Visit (INDEPENDENT_AMBULATORY_CARE_PROVIDER_SITE_OTHER): Payer: BLUE CROSS/BLUE SHIELD

## 2017-07-29 DIAGNOSIS — I5033 Acute on chronic diastolic (congestive) heart failure: Secondary | ICD-10-CM | POA: Diagnosis not present

## 2017-07-29 DIAGNOSIS — I480 Paroxysmal atrial fibrillation: Secondary | ICD-10-CM | POA: Diagnosis not present

## 2017-08-05 ENCOUNTER — Telehealth: Payer: Self-pay | Admitting: *Deleted

## 2017-08-05 NOTE — Telephone Encounter (Signed)
-----   Message from Minna Merritts, MD sent at 08/03/2017  2:45 PM EST ----- Echocardiogram result Ejection fraction 40-45% New finding of pericardial effusion now moderate in size previously was trivial We will need to watch him closely especially for symptoms of shortness of breath, orthostasis/lightheadedness Would will call our office if he has any of these symptoms Sometimes if effusion gets severe we need to place a needle into the outside of the heart to drain the fluid

## 2017-08-05 NOTE — Telephone Encounter (Signed)
Left voicemail message to call back regarding results.

## 2017-08-06 NOTE — Telephone Encounter (Signed)
Left voicemail message to call back  

## 2017-08-07 NOTE — Telephone Encounter (Signed)
Left voicemail message to call back  

## 2017-08-07 NOTE — Telephone Encounter (Signed)
Spoke with patients wife per release form and reviewed results and recommendations. She verbalized understanding and reports that he has had no shortness of breath. She does report that he has no energy with some fatigue and confusion. Reviewed medications but she was unsure if he was taking the ativan or flexeril but did review that these can cause fatigue. Advised that she may want to also check with his primary care provider regarding his fatigue and confusion. She verbalized understanding of our conversation, symptoms to monitor for that would require her to contact us, and she had no further questions at this time.

## 2017-08-07 NOTE — Telephone Encounter (Signed)
Patient wife returning call  °

## 2017-09-04 ENCOUNTER — Encounter: Payer: Self-pay | Admitting: Pulmonary Disease

## 2017-09-04 ENCOUNTER — Ambulatory Visit (INDEPENDENT_AMBULATORY_CARE_PROVIDER_SITE_OTHER): Payer: BLUE CROSS/BLUE SHIELD | Admitting: Pulmonary Disease

## 2017-09-04 DIAGNOSIS — C3492 Malignant neoplasm of unspecified part of left bronchus or lung: Secondary | ICD-10-CM

## 2017-09-04 DIAGNOSIS — J90 Pleural effusion, not elsewhere classified: Secondary | ICD-10-CM | POA: Diagnosis not present

## 2017-09-04 NOTE — Progress Notes (Signed)
   Subjective:    Patient ID: Jesse Avila, male    DOB: July 08, 1951, 67 y.o.   MRN: [id number]  HPI  27 heavy ex-smoker for FU of SCLC He was diagnosed 09/2016 and then since then has undergone chemotherapy and prophylactic brain radiation, CT chest from 06/2017 was reviewed which shows resolution of lung mass with left-sided infiltrates probably related to radiation pneumonitis. He had was hospitalized 02/2017 for atrial fibrillation RVR with a cardiomyopathy with EF of 20% which has since recovered to 45% on his last echo in 07/2017. He had a left effusion which was noted to be transudative on thoracentesis and has not recurred  Calcified pleural plaques were noted along the diaphragmatic pleural surfaces bilaterally. They have sought the services of an attorney for asbestos exposure   He also has significant peripheral arterial disease and had several surgeries including vascular bypass and carotid endarterectomy. Has a history of diabetes and stroke about 3 years ago with residual mild weakness of his left hand   He smoked about 2 packs per day starting as a teenager until he quit in 2014. 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 His sister was diagnosed with lung cancer  Review of Systems Patient denies significant dyspnea,cough, hemoptysis,  chest pain, palpitations, pedal edema, orthopnea, paroxysmal nocturnal dyspnea, lightheadedness, nausea, vomiting, abdominal or  leg pains      Objective:   Physical Exam  Gen. Pleasant, well-nourished, in no distress ENT - no thrush, no post nasal drip Neck: No JVD, no thyromegaly, no carotid bruits Lungs: no use of accessory muscles, no dullness to percussion, clear without rales or rhonchi  Cardiovascular: Rhythm regular, heart sounds  normal, no murmurs or gallops, no peripheral edema Musculoskeletal: No deformities, no cyanosis or clubbing        Assessment & Plan:

## 2017-09-04 NOTE — Patient Instructions (Addendum)
Call us if you have breathing issues. Good luck with your CAT scan

## 2017-09-07 NOTE — Assessment & Plan Note (Signed)
Follow-up CT scan is planned by oncology. Questions about his possible exposure to asbestos.  I clarified that all he has is calcified plaques & this could be post infectious or  related to  asbestos exposure

## 2017-09-07 NOTE — Assessment & Plan Note (Signed)
No recurrence. 

## 2017-09-09 ENCOUNTER — Other Ambulatory Visit: Payer: Self-pay | Admitting: Cardiovascular Disease

## 2017-09-23 ENCOUNTER — Ambulatory Visit (HOSPITAL_COMMUNITY)
Admission: RE | Admit: 2017-09-23 | Discharge: 2017-09-23 | Disposition: A | Discharge status: Home / Self Care | Payer: BLUE CROSS/BLUE SHIELD | Source: Ambulatory Visit | Attending: Internal Medicine | Admitting: Internal Medicine

## 2017-09-23 ENCOUNTER — Inpatient Hospital Stay: Payer: BLUE CROSS/BLUE SHIELD | Attending: Internal Medicine

## 2017-09-23 DIAGNOSIS — Z85118 Personal history of other malignant neoplasm of bronchus and lung: Secondary | ICD-10-CM | POA: Insufficient documentation

## 2017-09-23 DIAGNOSIS — Z923 Personal history of irradiation: Secondary | ICD-10-CM | POA: Insufficient documentation

## 2017-09-23 DIAGNOSIS — R1900 Intra-abdominal and pelvic swelling, mass and lump, unspecified site: Secondary | ICD-10-CM | POA: Diagnosis not present

## 2017-09-23 DIAGNOSIS — C3492 Malignant neoplasm of unspecified part of left bronchus or lung: Secondary | ICD-10-CM

## 2017-09-23 DIAGNOSIS — I6522 Occlusion and stenosis of left carotid artery: Secondary | ICD-10-CM | POA: Insufficient documentation

## 2017-09-23 DIAGNOSIS — I700 Atherosclerosis of aorta: Secondary | ICD-10-CM | POA: Diagnosis not present

## 2017-09-23 DIAGNOSIS — R1909 Other intra-abdominal and pelvic swelling, mass and lump: Secondary | ICD-10-CM | POA: Diagnosis not present

## 2017-09-23 LAB — COMPREHENSIVE METABOLIC PANEL
ALK PHOS: 103 U/L (ref 40–150)
ALT: 6 U/L (ref 0–55)
ANION GAP: 13 — AB (ref 3–11)
AST: 9 U/L (ref 5–34)
Albumin: 3.9 g/dL (ref 3.5–5.0)
BUN: 18 mg/dL (ref 7–26)
CALCIUM: 10.5 mg/dL — AB (ref 8.4–10.4)
CO2: 31 mmol/L — AB (ref 22–29)
Chloride: 94 mmol/L — ABNORMAL LOW (ref 98–109)
Creatinine, Ser: 1.78 mg/dL — ABNORMAL HIGH (ref 0.70–1.30)
GFR, EST AFRICAN AMERICAN: 45 mL/min — AB (ref >60)
GFR, EST NON AFRICAN AMERICAN: 39 mL/min — AB (ref >60)
Glucose, Bld: 240 mg/dL — ABNORMAL HIGH (ref 70–140)
Potassium: 3.3 mmol/L — ABNORMAL LOW (ref 3.5–5.1)
SODIUM: 138 mmol/L (ref 136–145)
TOTAL PROTEIN: 7.9 g/dL (ref 6.4–8.3)
Total Bilirubin: 0.8 mg/dL (ref 0.2–1.2)

## 2017-09-23 LAB — CBC WITH DIFFERENTIAL/PLATELET
Basophils Absolute: 0 10*3/uL (ref 0.0–0.1)
Basophils Relative: 1 %
EOS ABS: 0.1 10*3/uL (ref 0.0–0.5)
Eosinophils Relative: 2 %
HCT: 32.1 % — ABNORMAL LOW (ref 38.4–49.9)
HEMOGLOBIN: 10.9 g/dL — AB (ref 13.0–17.1)
LYMPHS ABS: 1.3 10*3/uL (ref 0.9–3.3)
Lymphocytes Relative: 18 %
MCH: 33.1 pg (ref 27.2–33.4)
MCHC: 34.1 g/dL (ref 32.0–36.0)
MCV: 97.1 fL (ref 79.3–98.0)
MONOS PCT: 7 %
Monocytes Absolute: 0.5 10*3/uL (ref 0.1–0.9)
NEUTROS PCT: 72 %
Neutro Abs: 5.4 10*3/uL (ref 1.5–6.5)
Platelets: 167 10*3/uL (ref 140–400)
RBC: 3.3 MIL/uL — ABNORMAL LOW (ref 4.20–5.82)
RDW: 16.5 % — ABNORMAL HIGH (ref 11.0–14.6)
WBC: 7.4 10*3/uL (ref 4.0–10.3)

## 2017-09-23 MED ORDER — IOPAMIDOL (ISOVUE-300) INJECTION 61%
INTRAVENOUS | Status: AC
  Administered 2017-09-23: 80 mL
  Filled 2017-09-23: qty 100

## 2017-09-28 ENCOUNTER — Encounter: Payer: Self-pay | Admitting: Internal Medicine

## 2017-09-28 ENCOUNTER — Inpatient Hospital Stay (HOSPITAL_BASED_OUTPATIENT_CLINIC_OR_DEPARTMENT_OTHER): Payer: BLUE CROSS/BLUE SHIELD | Admitting: Internal Medicine

## 2017-09-28 ENCOUNTER — Telehealth: Payer: Self-pay

## 2017-09-28 ENCOUNTER — Other Ambulatory Visit: Payer: Self-pay

## 2017-09-28 ENCOUNTER — Telehealth: Payer: Self-pay | Admitting: Gastroenterology

## 2017-09-28 VITALS — BP 112/73 | HR 91 | Temp 98.9°F | Resp 18 | Ht 73.0 in | Wt 179.6 lb

## 2017-09-28 DIAGNOSIS — R1909 Other intra-abdominal and pelvic swelling, mass and lump: Secondary | ICD-10-CM

## 2017-09-28 DIAGNOSIS — I10 Essential (primary) hypertension: Secondary | ICD-10-CM

## 2017-09-28 DIAGNOSIS — K8689 Other specified diseases of pancreas: Secondary | ICD-10-CM

## 2017-09-28 DIAGNOSIS — Z85118 Personal history of other malignant neoplasm of bronchus and lung: Secondary | ICD-10-CM | POA: Diagnosis not present

## 2017-09-28 DIAGNOSIS — C3492 Malignant neoplasm of unspecified part of left bronchus or lung: Secondary | ICD-10-CM

## 2017-09-28 DIAGNOSIS — K2100 Gastro-esophageal reflux disease with esophagitis, without bleeding: Secondary | ICD-10-CM

## 2017-09-28 NOTE — Telephone Encounter (Signed)
Anti coag letter sent to Dr Rockey Situ

## 2017-09-28 NOTE — Telephone Encounter (Signed)
EUS scheduled, pt instructed and medications reviewed.  Patient instructions mailed to home.  Patient to call with any questions or concerns.  

## 2017-09-28 NOTE — Telephone Encounter (Signed)
EUS 10/08/17 2 pm

## 2017-09-28 NOTE — Telephone Encounter (Signed)
Dr Jacobs please advise  

## 2017-09-28 NOTE — Telephone Encounter (Signed)
Jesse Avila,  The mass is in a bit of tricky spot (inferior to the panc head) but worth a try. Thanks    [name],  Can you contact him to set up upper EUS, radial and linear, March 7th thursday with MAC sedation. 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  He'll need to hold eliquis for 2 days prior, will need toh ave his cardiologist OK that hold).  Thanks

## 2017-09-28 NOTE — Telephone Encounter (Signed)
Printed avs and calender of upcoming appointment. Per 2/25 los 

## 2017-09-28 NOTE — Progress Notes (Signed)
[place] [phone]   [phone]  OFFICE PROGRESS NOTE  [name], MD [address] [place] [address]  DIAGNOSIS: Extensive stage (T4, N2, M1b) small cell lung cancer presented with large left upper lobe lung mass with mediastinal invasion as well as pancreatic lesion and suspicious left iliac bone metastasis diagnosed in February 2018.  PRIOR THERAPY: 1) Systemic chemotherapy with carboplatin for AUC of 5 on day 1 and etoposide 100 MG/M2 on days 1, 2 and 3 with Neulasta support status post 6 cycles. This is concurrent with radiotherapy. This also concurrent with radiation to the large lung mass. 2) prophylactic cranial irradiation under the care of Dr. Sondra Come.  CURRENT THERAPY: Observation.   INTERVAL HISTORY: Jesse Avila 67 y.o. male returns to the clinic today for follow-up visit accompanied by his wife.  The patient is feeling fine today with no specific complaints.  He denied having any chest pain, shortness of breath, cough or hemoptysis.  He denied having any fever or chills.  He has no nausea, vomiting, diarrhea or constipation.  The patient denied having any recent weight loss or night sweats.  He denied having any headache or visual changes.  He had repeat CT scan of the chest, abdomen and pelvis performed recently and he is here for evaluation and discussion of his discuss results.  MEDICAL HISTORY: Past Medical History:  Diagnosis Date  . Atrial fibrillation ([place])   . Carotid artery occlusion   . DJD (degenerative joint disease) of knee   . Essential hypertension, benign    takes Benicar daily  . Goals of care, counseling/discussion 09/18/2016  . History of colon polyps    benign  . History of gout   . History of radiation therapy 09/30/16-11/10/16   left lung 60 Gy in 30 fractions  . Hyperlipidemia    takes Fenofibrate and Crestor daily  . Insomnia    takes Ambien nightly as needed  . Joint pain     . Lung cancer ([place])   . Nocturia   . Peripheral vascular disease ([place])   . Primary localized osteoarthritis of left knee 12/26/2014  . Stroke [facility]) 2015?   takes Plavix daily as well as Pletal,not on plavix at present 05/15/15  . 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  MEDICATIONS:  Current Outpatient Medications  Medication Sig Dispense Refill  . AFLURIA QUADRIVALENT 0.5 ML injection TO BE ADMINISTERED BY PHARMACIST FOR IMMUNIZATION  0  . amiodarone (PACERONE) 200 MG tablet Take 200 mg by mouth daily.    Marland Kitchen apixaban (ELIQUIS) 5 MG TABS tablet Take 1 tablet (5 mg total) by mouth 2 (two) times daily. 60 tablet 3  . cyclobenzaprine (FLEXERIL) 10 MG tablet Take 1 tablet (10 mg total) by mouth 2 (two) times daily as needed for muscle spasms. (Patient not taking: Reported on 09/04/2017) 20 tablet 0  . furosemide (LASIX) 40 MG tablet TAKE 1 TABLET BY MOUTH TWICE A DAY 60 tablet 6  . insulin glargine (LANTUS) 100 UNIT/ML injection Inject 0.1 mLs (10 Units total) into the skin daily. (Patient taking differently: Inject 13 Units into the skin at bedtime. ) 10 mL 11  . LORazepam (ATIVAN) 1 MG tablet Take 1 tablet (1 mg total) by mouth every 12 (twelve) hours as needed for anxiety. (  Patient not taking: Reported on 07/17/2017) 30 tablet 0  . mirtazapine (REMERON) 7.5 MG tablet Take 7.5 mg by mouth at bedtime.    Marland Kitchen spironolactone (ALDACTONE) 25 MG tablet Take 1 tablet (25 mg total) by mouth daily. 30 tablet 0  . zolpidem (AMBIEN) 10 MG tablet Take 10 mg by mouth at bedtime.      No current facility-administered medications for this visit.    Facility-Administered Medications Ordered in Other Visits  Medication Dose Route Frequency Provider Last Rate Last Dose  . heparin lock flush 100 unit/mL  500 Units Intracatheter Once PRN Curt Bears, MD      . sodium chloride flush (NS) 0.9 % injection 10 mL  10 mL Intracatheter PRN Curt Bears, MD   10 mL at 10/13/16 1618  . sodium chloride flush (NS) 0.9 % injection 10 mL  10 mL Intracatheter PRN Curt Bears, MD      . sodium chloride flush (NS) 0.9 % injection 10 mL  10 mL Intracatheter PRN Curt Bears, MD   10 mL at 11/26/16 1544    SURGICAL HISTORY:  Past Surgical History:  Procedure Laterality Date  . ABDOMINAL AORTAGRAM N/A 03/29/2012   Procedure: ABDOMINAL Maxcine Ham;  Surgeon: Angelia Mould, MD;  Location: [facility] CATH LAB;  Service: Cardiovascular;  Laterality: N/A;  . CARDIOVERSION N/A 05/08/2017   Procedure: CARDIOVERSION;  Surgeon: Minna Merritts, MD;  Location: [facility] ORS;  Service: Cardiovascular;  Laterality: N/A;  . COLONOSCOPY    . ENDARTERECTOMY Right 05/09/2014   Procedure: RIGHT CAROTID ENDARTERECTOMY WITH PATCH ANGIOPLASTY;  Surgeon: Conrad Maytown, MD;  Location: [facility];  Service: Vascular;  Laterality: Right;  . ENDARTERECTOMY FEMORAL Right 05/17/2015   Procedure: ENDARTERECTOMY FEMORAL;  Surgeon: Angelia Mould, MD;  Location: [facility];  Service: Vascular;  Laterality: Right;  . ENDOBRONCHIAL ULTRASOUND Bilateral 09/08/2016   Procedure: ENDOBRONCHIAL ULTRASOUND;  Surgeon: Rigoberto Noel, MD;  Location: [facility] ENDOSCOPY;  Service: Cardiopulmonary;  Laterality: Bilateral;  . FEMORAL-POPLITEAL BYPASS GRAFT Right 05/17/2015   Procedure: BYPASS GRAFT FEMORAL-BELOW KNEE POPLITEAL ARTERY, using gortex propaten graft 6 mm x 80 cm;  Surgeon: Angelia Mould, MD;  Location: [facility];  Service: Vascular;  Laterality: Right;  . IR GENERIC HISTORICAL  09/25/2016   IR FLUORO GUIDE PORT INSERTION RIGHT 09/25/2016 Markus Daft, MD [facility]-INTERV RAD  . IR GENERIC HISTORICAL  09/25/2016   IR US GUIDE VASC ACCESS RIGHT 09/25/2016 Markus Daft, MD [facility]INTER[facility]  . KNEE ARTHROSCOPY Right 11/2009  . LOWER EXTREMITY ANGIOGRAM Bilateral 03/23/2015   Procedure: Lower Extremity  Angiogram;  Surgeon: Angelia Mould, MD;  Location: [facility] CV LAB;  Service: Cardiovascular;  Laterality: Bilateral;  . MENISCUS REPAIR  11/2009  . PARTIAL KNEE ARTHROPLASTY Left 12/26/2014   Procedure: UNICOMPARTMENTAL KNEE;  Surgeon: Marchia Bond, MD;  Location: [place];  Service: Orthopedics;  Laterality: Left;  . PATCH ANGIOPLASTY Right 05/17/2015   Procedure: VEIN PATCH ANGIOPLASTY ILEOFEMORAL ARTERY;  Surgeon: Angelia Mould, MD;  Location: [place];  Service: Vascular;  Laterality: Right;  . PERCUTANEOUS STENT INTERVENTION N/A 05/10/2012   Procedure: PERCUTANEOUS STENT INTERVENTION;  Surgeon: Angelia Mould, MD;  Location: [facility] CATH LAB;  Service: Cardiovascular;  Laterality: N/A;  . PERIPHERAL VASCULAR CATHETERIZATION N/A 03/23/2015   Procedure: Abdominal Aortogram;  Surgeon: Angelia Mould, MD;  Location: [place] CV LAB;  Service: Cardiovascular;  Laterality: N/A;  . STENTS     PLACED IN ??BOTH LEGS   2013?  Marland Kitchen  TEE WITHOUT CARDIOVERSION N/A 03/06/2017   Procedure: TRANSESOPHAGEAL ECHOCARDIOGRAM (TEE);  Surgeon: Larey Dresser, MD;  Location: [facility] ENDOSCOPY;  Service: Cardiovascular;  Laterality: N/A;  . TEE WITHOUT CARDIOVERSION N/A 05/08/2017   Procedure: TRANSESOPHAGEAL ECHOCARDIOGRAM (TEE);  Surgeon: Minna Merritts, MD;  Location: [facility] ORS;  Service: Cardiovascular;  Laterality: N/A;    REVIEW OF SYSTEMS:  Constitutional: negative Eyes: negative Ears, nose, mouth, throat, and face: negative Respiratory: negative Cardiovascular: negative Gastrointestinal: negative Genitourinary:negative Integument/breast: negative Hematologic/lymphatic: negative Musculoskeletal:negative Neurological: negative Behavioral/Psych: negative Endocrine: negative Allergic/Immunologic: negative   PHYSICAL EXAMINATION: General appearance: alert, cooperative and no distress Head: Normocephalic, without obvious abnormality, atraumatic Neck: no adenopathy, no JVD, supple,  symmetrical, trachea midline and thyroid not enlarged, symmetric, no tenderness/mass/nodules Lymph nodes: Cervical, supraclavicular, and axillary nodes normal. Resp: clear to auscultation bilaterally Back: symmetric, no curvature. ROM normal. No CVA tenderness. Cardio: regular rate and rhythm, S1, S2 normal, no murmur, click, rub or gallop GI: soft, non-tender; bowel sounds normal; no masses,  no organomegaly Extremities: extremities normal, atraumatic, no cyanosis or edema Neurologic: Alert and oriented X 3, normal strength and tone. Normal symmetric reflexes. Normal coordination and gait  ECOG PERFORMANCE STATUS: 1 - Symptomatic but completely ambulatory  Blood pressure 112/73, pulse 91, temperature 98.9 F (37.2 C), temperature source Oral, resp. rate 18, height 6\' 1"  (1.854 m), weight 179 lb 9.6 oz (81.5 kg), SpO2 98 %.  LABORATORY DATA: Lab Results  Component Value Date   WBC 7.4 09/23/2017   HGB 10.9 (L) 09/23/2017   HCT 32.1 (L) 09/23/2017   MCV 97.1 09/23/2017   PLT 167 09/23/2017      Chemistry      Component Value Date/Time   NA 138 09/23/2017 1036   NA 137 06/19/2017 0922   K 3.3 (L) 09/23/2017 1036   K 4.1 06/19/2017 0922   CL 94 (L) 09/23/2017 1036   CO2 31 (H) 09/23/2017 1036   CO2 28 06/19/2017 0922   BUN 18 09/23/2017 1036   BUN 12.6 06/19/2017 0922   CREATININE 1.78 (H) 09/23/2017 1036   CREATININE 1.3 06/19/2017 0922      Component Value Date/Time   CALCIUM 10.5 (H) 09/23/2017 1036   CALCIUM 9.6 06/19/2017 0922   ALKPHOS 103 09/23/2017 1036   ALKPHOS 116 06/19/2017 0922   AST 9 09/23/2017 1036   AST 15 06/19/2017 0922   ALT 6 09/23/2017 1036   ALT 14 06/19/2017 0922   BILITOT 0.8 09/23/2017 1036   BILITOT 0.56 06/19/2017 0922       RADIOGRAPHIC STUDIES: Ct Chest W Contrast  Result Date: 09/23/2017 CLINICAL DATA:  Small cell lung cancer on the left diagnosed in 2018. Chemotherapy completed. EXAM: CT CHEST, ABDOMEN, AND PELVIS WITH CONTRAST  TECHNIQUE: Multidetector CT imaging of the chest, abdomen and pelvis was performed following the standard protocol during bolus administration of intravenous contrast. CONTRAST:  65mL ISOVUE-300 IOPAMIDOL (ISOVUE-300) INJECTION 61% COMPARISON:  CT 06/19/2017. FINDINGS: CT CHEST FINDINGS Cardiovascular: Diffuse atherosclerosis of the aorta, great vessels and coronary arteries. There is stable intimal irregularity at the origin of the left common carotid artery. No evidence of large vessel occlusion or other acute vascular findings. Right IJ Port-A-Cath extends to the superior cavoatrial junction. The heart size is normal. There is no pericardial effusion. Mediastinum/Nodes: There are no enlarged mediastinal, hilar or axillary lymph nodes. The thyroid gland, trachea and esophagus demonstrate no significant findings. Lungs/Pleura: There are diffuse pleural calcifications bilaterally and a stable small left pleural effusion. There  is stable volume loss in the left hemithorax with paramediastinal bronchiectasis and scarring attributed to previous radiation therapy. No recurrent mass lesion identified. There are no suspicious pulmonary nodules. The right lung is clear. Musculoskeletal/Chest wall: No chest wall mass or suspicious osseous findings. CT ABDOMEN AND PELVIS FINDINGS Hepatobiliary: The liver is normal in density without focal abnormality. No evidence of gallstones, gallbladder wall thickening or biliary dilatation. Pancreas: There is a new mass inferior to the uncinate process of the pancreas and superior to the transverse duodenum. This measures 4.6 x 3.3 cm on image 72. There is no clear fat plane between this mass and the pancreas. However, there is no pancreatic or biliary ductal dilatation. The pancreas otherwise appears normal. There are no surrounding inflammatory changes. Spleen: Normal in size without focal abnormality. Adrenals/Urinary Tract: Both adrenal glands appear normal. The kidneys appear stable  without suspicious findings. No evidence of urinary tract calculus or hydronephrosis. The bladder appears normal. Stomach/Bowel: No evidence of bowel wall thickening, distention or surrounding inflammatory change. The appendix appears normal. Mild diverticular changes are present in the distal colon. Vascular/Lymphatic: As above, there is a new mass between the uncinate process of the pancreas and the transverse duodenum which may reflect a nodal mass. No other enlarged abdominopelvic lymph nodes are seen. There is extensive aortic and branch vessel atherosclerosis. Probable chronically occluded graft in the right groin, unchanged from previous study. Evidence of underlying significant SFA narrowing bilaterally, similar to previous study. Reproductive: The prostate gland and seminal vesicles appear normal. Other: No evidence of abdominal wall mass or hernia. No ascites. Musculoskeletal: No acute or significant osseous findings. Ill-defined sclerosis in the left iliac bone (best seen on coronal image 105) is stable. IMPRESSION: 1. Evolving radiation changes in the left lung. No evidence of local recurrence or metastatic disease in the chest. 2. New retroperitoneal mass between the pancreatic head and transverse duodenum of uncertain etiology. This could reflect a pseudocyst if the patient has had interval pancreatitis. However, primary pancreatic neoplasm and nodal metastasis from the patient's lung cancer are definite possibilities. Endoscopic ultrasound suggested for further evaluation and tissue sampling if there is no clinical suspicion of recent pancreatitis. 3. No other evidence of metastatic disease in the abdomen or pelvis. 4. Extensive atherosclerosis with areas of chronic luminal narrowing affecting the left common carotid artery and both proximal femoral arteries. Aortic Atherosclerosis (ICD10-I70.0). Electronically Signed   By: Richardean Sale M.D.   On: 09/23/2017 14:37   Ct Abdomen Pelvis W  Contrast  Result Date: 09/23/2017 CLINICAL DATA:  Small cell lung cancer on the left diagnosed in 2018. Chemotherapy completed. EXAM: CT CHEST, ABDOMEN, AND PELVIS WITH CONTRAST TECHNIQUE: Multidetector CT imaging of the chest, abdomen and pelvis was performed following the standard protocol during bolus administration of intravenous contrast. CONTRAST:  29mL ISOVUE-300 IOPAMIDOL (ISOVUE-300) INJECTION 61% COMPARISON:  CT 06/19/2017. FINDINGS: CT CHEST FINDINGS Cardiovascular: Diffuse atherosclerosis of the aorta, great vessels and coronary arteries. There is stable intimal irregularity at the origin of the left common carotid artery. No evidence of large vessel occlusion or other acute vascular findings. Right IJ Port-A-Cath extends to the superior cavoatrial junction. The heart size is normal. There is no pericardial effusion. Mediastinum/Nodes: There are no enlarged mediastinal, hilar or axillary lymph nodes. The thyroid gland, trachea and esophagus demonstrate no significant findings. Lungs/Pleura: There are diffuse pleural calcifications bilaterally and a stable small left pleural effusion. There is stable volume loss in the left hemithorax with paramediastinal bronchiectasis and scarring  attributed to previous radiation therapy. No recurrent mass lesion identified. There are no suspicious pulmonary nodules. The right lung is clear. Musculoskeletal/Chest wall: No chest wall mass or suspicious osseous findings. CT ABDOMEN AND PELVIS FINDINGS Hepatobiliary: The liver is normal in density without focal abnormality. No evidence of gallstones, gallbladder wall thickening or biliary dilatation. Pancreas: There is a new mass inferior to the uncinate process of the pancreas and superior to the transverse duodenum. This measures 4.6 x 3.3 cm on image 72. There is no clear fat plane between this mass and the pancreas. However, there is no pancreatic or biliary ductal dilatation. The pancreas otherwise appears normal.  There are no surrounding inflammatory changes. Spleen: Normal in size without focal abnormality. Adrenals/Urinary Tract: Both adrenal glands appear normal. The kidneys appear stable without suspicious findings. No evidence of urinary tract calculus or hydronephrosis. The bladder appears normal. Stomach/Bowel: No evidence of bowel wall thickening, distention or surrounding inflammatory change. The appendix appears normal. Mild diverticular changes are present in the distal colon. Vascular/Lymphatic: As above, there is a new mass between the uncinate process of the pancreas and the transverse duodenum which may reflect a nodal mass. No other enlarged abdominopelvic lymph nodes are seen. There is extensive aortic and branch vessel atherosclerosis. Probable chronically occluded graft in the right groin, unchanged from previous study. Evidence of underlying significant SFA narrowing bilaterally, similar to previous study. Reproductive: The prostate gland and seminal vesicles appear normal. Other: No evidence of abdominal wall mass or hernia. No ascites. Musculoskeletal: No acute or significant osseous findings. Ill-defined sclerosis in the left iliac bone (best seen on coronal image 105) is stable. IMPRESSION: 1. Evolving radiation changes in the left lung. No evidence of local recurrence or metastatic disease in the chest. 2. New retroperitoneal mass between the pancreatic head and transverse duodenum of uncertain etiology. This could reflect a pseudocyst if the patient has had interval pancreatitis. However, primary pancreatic neoplasm and nodal metastasis from the patient's lung cancer are definite possibilities. Endoscopic ultrasound suggested for further evaluation and tissue sampling if there is no clinical suspicion of recent pancreatitis. 3. No other evidence of metastatic disease in the abdomen or pelvis. 4. Extensive atherosclerosis with areas of chronic luminal narrowing affecting the left common carotid  artery and both proximal femoral arteries. Aortic Atherosclerosis (ICD10-I70.0). Electronically Signed   By: Richardean Sale M.D.   On: 09/23/2017 14:37    ASSESSMENT AND PLAN:  This is a very pleasant 67 years old white male with extensive stage small cell lung cancer completed 6 cycles of systemic chemotherapy with carboplatin and etoposide concurrent with radiation to the large left upper lobe mass.  The patient tolerated the treatment well except for the chemotherapy-induced pancytopenia and significant anemia. This was followed by prophylactic cranial irradiation. The patient is currently on observation and he is feeling fine. Repeat CT scan of the chest, abdomen and pelvis showed no concerning findings for disease recurrence in the chest but the patient has new retroperitoneal mass between the pancreatic head and transverse duodenum.  This is suspicious for recurrent small cell lung cancer versus pancreatic malignancy.  I personally and independently reviewed the scan images and discussed the results and showed the images to the patient and his wife. I recommended for the patient to see Dr. Oretha Caprice for consideration of endoscopic ultrasound and biopsy of the pancreatic mass. I will see him back for follow-up visit in 3 weeks for reevaluation and discussion of his treatment options based on the  biopsy results. He was advised to call immediately if he has any concerning symptoms in the interval. The patient voices understanding of current disease status and treatment options and is in agreement with the current care plan. All questions were answered. The patient knows to call the clinic with any problems, questions or concerns. We can certainly see the patient much sooner if necessary.  Disclaimer: This note was dictated with voice recognition software. Similar sounding words can inadvertently be transcribed and may not be corrected upon review.

## 2017-09-28 NOTE — Telephone Encounter (Signed)
Thank you for giving it a try.

## 2017-09-28 NOTE — H&P (View-Only) (Signed)
[place] Telephone:([phone]) [phone]   [phone]  OFFICE PROGRESS NOTE  [name], MD [address] [place] [address]  DIAGNOSIS: Extensive stage (T4, N2, M1b) small cell lung cancer presented with large left upper lobe lung mass with mediastinal invasion as well as pancreatic lesion and suspicious left iliac bone metastasis diagnosed in February 2018.  PRIOR THERAPY: 1) Systemic chemotherapy with carboplatin for AUC of 5 on day 1 and etoposide 100 MG/M2 on days 1, 2 and 3 with Neulasta support status post 6 cycles. This is concurrent with radiotherapy. This also concurrent with radiation to the large lung mass. 2) prophylactic cranial irradiation under the care of Dr. Sondra Come.  CURRENT THERAPY: Observation.   INTERVAL HISTORY: Jesse Avila 67 y.o. male returns to the clinic today for follow-up visit accompanied by his wife.  The patient is feeling fine today with no specific complaints.  He denied having any chest pain, shortness of breath, cough or hemoptysis.  He denied having any fever or chills.  He has no nausea, vomiting, diarrhea or constipation.  The patient denied having any recent weight loss or night sweats.  He denied having any headache or visual changes.  He had repeat CT scan of the chest, abdomen and pelvis performed recently and he is here for evaluation and discussion of his discuss results.  MEDICAL HISTORY: Past Medical History:  Diagnosis Date  . Atrial fibrillation ([place])   . Carotid artery occlusion   . DJD (degenerative joint disease) of knee   . Essential hypertension, benign    takes Benicar daily  . Goals of care, counseling/discussion 09/18/2016  . History of colon polyps    benign  . History of gout   . History of radiation therapy 09/30/16-11/10/16   left lung 60 Gy in 30 fractions  . Hyperlipidemia    takes Fenofibrate and Crestor daily  . Insomnia    takes Ambien nightly as needed  . Joint pain     . Lung cancer ([place])   . Nocturia   . Peripheral vascular disease ([place])   . Primary localized osteoarthritis of left knee 12/26/2014  . Stroke [facility]) 2015?   takes Plavix daily as well as Pletal,not on plavix at present 05/15/15  . 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  MEDICATIONS:  Current Outpatient Medications  Medication Sig Dispense Refill  . AFLURIA QUADRIVALENT 0.5 ML injection TO BE ADMINISTERED BY PHARMACIST FOR IMMUNIZATION  0  . amiodarone (PACERONE) 200 MG tablet Take 200 mg by mouth daily.    Marland Kitchen apixaban (ELIQUIS) 5 MG TABS tablet Take 1 tablet (5 mg total) by mouth 2 (two) times daily. 60 tablet 3  . cyclobenzaprine (FLEXERIL) 10 MG tablet Take 1 tablet (10 mg total) by mouth 2 (two) times daily as needed for muscle spasms. (Patient not taking: Reported on 09/04/2017) 20 tablet 0  . furosemide (LASIX) 40 MG tablet TAKE 1 TABLET BY MOUTH TWICE A DAY 60 tablet 6  . insulin glargine (LANTUS) 100 UNIT/ML injection Inject 0.1 mLs (10 Units total) into the skin daily. (Patient taking differently: Inject 13 Units into the skin at bedtime. ) 10 mL 11  . LORazepam (ATIVAN) 1 MG tablet Take 1 tablet (1 mg total) by mouth every 12 (twelve) hours as needed for anxiety. (  Patient not taking: Reported on 07/17/2017) 30 tablet 0  . mirtazapine (REMERON) 7.5 MG tablet Take 7.5 mg by mouth at bedtime.    Marland Kitchen spironolactone (ALDACTONE) 25 MG tablet Take 1 tablet (25 mg total) by mouth daily. 30 tablet 0  . zolpidem (AMBIEN) 10 MG tablet Take 10 mg by mouth at bedtime.      No current facility-administered medications for this visit.    Facility-Administered Medications Ordered in Other Visits  Medication Dose Route Frequency Provider Last Rate Last Dose  . heparin lock flush 100 unit/mL  500 Units Intracatheter Once PRN Curt Bears, MD      . sodium chloride flush (NS) 0.9 % injection 10 mL  10 mL Intracatheter PRN Curt Bears, MD   10 mL at 10/13/16 1618  . sodium chloride flush (NS) 0.9 % injection 10 mL  10 mL Intracatheter PRN Curt Bears, MD      . sodium chloride flush (NS) 0.9 % injection 10 mL  10 mL Intracatheter PRN Curt Bears, MD   10 mL at 11/26/16 1544    SURGICAL HISTORY:  Past Surgical History:  Procedure Laterality Date  . ABDOMINAL AORTAGRAM N/A 03/29/2012   Procedure: ABDOMINAL Maxcine Ham;  Surgeon: Angelia Mould, MD;  Location: [facility] CATH LAB;  Service: Cardiovascular;  Laterality: N/A;  . CARDIOVERSION N/A 05/08/2017   Procedure: CARDIOVERSION;  Surgeon: Minna Merritts, MD;  Location: [facility] ORS;  Service: Cardiovascular;  Laterality: N/A;  . COLONOSCOPY    . ENDARTERECTOMY Right 05/09/2014   Procedure: RIGHT CAROTID ENDARTERECTOMY WITH PATCH ANGIOPLASTY;  Surgeon: Conrad Aventura, MD;  Location: [place];  Service: Vascular;  Laterality: Right;  . ENDARTERECTOMY FEMORAL Right 05/17/2015   Procedure: ENDARTERECTOMY FEMORAL;  Surgeon: Angelia Mould, MD;  Location: [place];  Service: Vascular;  Laterality: Right;  . ENDOBRONCHIAL ULTRASOUND Bilateral 09/08/2016   Procedure: ENDOBRONCHIAL ULTRASOUND;  Surgeon: Rigoberto Noel, MD;  Location: [facility] ENDOSCOPY;  Service: Cardiopulmonary;  Laterality: Bilateral;  . FEMORAL-POPLITEAL BYPASS GRAFT Right 05/17/2015   Procedure: BYPASS GRAFT FEMORAL-BELOW KNEE POPLITEAL ARTERY, using gortex propaten graft 6 mm x 80 cm;  Surgeon: Angelia Mould, MD;  Location: [place];  Service: Vascular;  Laterality: Right;  . IR GENERIC HISTORICAL  09/25/2016   IR FLUORO GUIDE PORT INSERTION RIGHT 09/25/2016 Markus Daft, MD [facility]-INTERV RAD  . IR GENERIC HISTORICAL  09/25/2016   IR US GUIDE VASC ACCESS RIGHT 09/25/2016 Markus Daft, MD [facility]INTER[facility] RAD  . KNEE ARTHROSCOPY Right 11/2009  . LOWER EXTREMITY ANGIOGRAM Bilateral 03/23/2015   Procedure: Lower Extremity  Angiogram;  Surgeon: Angelia Mould, MD;  Location: [place] CV LAB;  Service: Cardiovascular;  Laterality: Bilateral;  . MENISCUS REPAIR  11/2009  . PARTIAL KNEE ARTHROPLASTY Left 12/26/2014   Procedure: UNICOMPARTMENTAL KNEE;  Surgeon: Marchia Bond, MD;  Location: [place];  Service: Orthopedics;  Laterality: Left;  . PATCH ANGIOPLASTY Right 05/17/2015   Procedure: VEIN PATCH ANGIOPLASTY ILEOFEMORAL ARTERY;  Surgeon: Angelia Mould, MD;  Location: [place];  Service: Vascular;  Laterality: Right;  . PERCUTANEOUS STENT INTERVENTION N/A 05/10/2012   Procedure: PERCUTANEOUS STENT INTERVENTION;  Surgeon: Angelia Mould, MD;  Location: [facility] CATH LAB;  Service: Cardiovascular;  Laterality: N/A;  . PERIPHERAL VASCULAR CATHETERIZATION N/A 03/23/2015   Procedure: Abdominal Aortogram;  Surgeon: Angelia Mould, MD;  Location: [place] CV LAB;  Service: Cardiovascular;  Laterality: N/A;  . STENTS     PLACED IN ??BOTH LEGS   2013?  Marland Kitchen  TEE WITHOUT CARDIOVERSION N/A 03/06/2017   Procedure: TRANSESOPHAGEAL ECHOCARDIOGRAM (TEE);  Surgeon: Larey Dresser, MD;  Location: [facility] ENDOSCOPY;  Service: Cardiovascular;  Laterality: N/A;  . TEE WITHOUT CARDIOVERSION N/A 05/08/2017   Procedure: TRANSESOPHAGEAL ECHOCARDIOGRAM (TEE);  Surgeon: Minna Merritts, MD;  Location: [facility] ORS;  Service: Cardiovascular;  Laterality: N/A;    REVIEW OF SYSTEMS:  Constitutional: negative Eyes: negative Ears, nose, mouth, throat, and face: negative Respiratory: negative Cardiovascular: negative Gastrointestinal: negative Genitourinary:negative Integument/breast: negative Hematologic/lymphatic: negative Musculoskeletal:negative Neurological: negative Behavioral/Psych: negative Endocrine: negative Allergic/Immunologic: negative   PHYSICAL EXAMINATION: General appearance: alert, cooperative and no distress Head: Normocephalic, without obvious abnormality, atraumatic Neck: no adenopathy, no JVD, supple,  symmetrical, trachea midline and thyroid not enlarged, symmetric, no tenderness/mass/nodules Lymph nodes: Cervical, supraclavicular, and axillary nodes normal. Resp: clear to auscultation bilaterally Back: symmetric, no curvature. ROM normal. No CVA tenderness. Cardio: regular rate and rhythm, S1, S2 normal, no murmur, click, rub or gallop GI: soft, non-tender; bowel sounds normal; no masses,  no organomegaly Extremities: extremities normal, atraumatic, no cyanosis or edema Neurologic: Alert and oriented X 3, normal strength and tone. Normal symmetric reflexes. Normal coordination and gait  ECOG PERFORMANCE STATUS: 1 - Symptomatic but completely ambulatory  Blood pressure 112/73, pulse 91, temperature 98.9 F (37.2 C), temperature source Oral, resp. rate 18, height 6\' 1"  (1.854 m), weight 179 lb 9.6 oz (81.5 kg), SpO2 98 %.  LABORATORY DATA: Lab Results  Component Value Date   WBC 7.4 09/23/2017   HGB 10.9 (L) 09/23/2017   HCT 32.1 (L) 09/23/2017   MCV 97.1 09/23/2017   PLT 167 09/23/2017      Chemistry      Component Value Date/Time   NA 138 09/23/2017 1036   NA 137 06/19/2017 0922   K 3.3 (L) 09/23/2017 1036   K 4.1 06/19/2017 0922   CL 94 (L) 09/23/2017 1036   CO2 31 (H) 09/23/2017 1036   CO2 28 06/19/2017 0922   BUN 18 09/23/2017 1036   BUN 12.6 06/19/2017 0922   CREATININE 1.78 (H) 09/23/2017 1036   CREATININE 1.3 06/19/2017 0922      Component Value Date/Time   CALCIUM 10.5 (H) 09/23/2017 1036   CALCIUM 9.6 06/19/2017 0922   ALKPHOS 103 09/23/2017 1036   ALKPHOS 116 06/19/2017 0922   AST 9 09/23/2017 1036   AST 15 06/19/2017 0922   ALT 6 09/23/2017 1036   ALT 14 06/19/2017 0922   BILITOT 0.8 09/23/2017 1036   BILITOT 0.56 06/19/2017 0922       RADIOGRAPHIC STUDIES: Ct Chest W Contrast  Result Date: 09/23/2017 CLINICAL DATA:  Small cell lung cancer on the left diagnosed in 2018. Chemotherapy completed. EXAM: CT CHEST, ABDOMEN, AND PELVIS WITH CONTRAST  TECHNIQUE: Multidetector CT imaging of the chest, abdomen and pelvis was performed following the standard protocol during bolus administration of intravenous contrast. CONTRAST:  82mL ISOVUE-300 IOPAMIDOL (ISOVUE-300) INJECTION 61% COMPARISON:  CT 06/19/2017. FINDINGS: CT CHEST FINDINGS Cardiovascular: Diffuse atherosclerosis of the aorta, great vessels and coronary arteries. There is stable intimal irregularity at the origin of the left common carotid artery. No evidence of large vessel occlusion or other acute vascular findings. Right IJ Port-A-Cath extends to the superior cavoatrial junction. The heart size is normal. There is no pericardial effusion. Mediastinum/Nodes: There are no enlarged mediastinal, hilar or axillary lymph nodes. The thyroid gland, trachea and esophagus demonstrate no significant findings. Lungs/Pleura: There are diffuse pleural calcifications bilaterally and a stable small left pleural effusion. There  is stable volume loss in the left hemithorax with paramediastinal bronchiectasis and scarring attributed to previous radiation therapy. No recurrent mass lesion identified. There are no suspicious pulmonary nodules. The right lung is clear. Musculoskeletal/Chest wall: No chest wall mass or suspicious osseous findings. CT ABDOMEN AND PELVIS FINDINGS Hepatobiliary: The liver is normal in density without focal abnormality. No evidence of gallstones, gallbladder wall thickening or biliary dilatation. Pancreas: There is a new mass inferior to the uncinate process of the pancreas and superior to the transverse duodenum. This measures 4.6 x 3.3 cm on image 72. There is no clear fat plane between this mass and the pancreas. However, there is no pancreatic or biliary ductal dilatation. The pancreas otherwise appears normal. There are no surrounding inflammatory changes. Spleen: Normal in size without focal abnormality. Adrenals/Urinary Tract: Both adrenal glands appear normal. The kidneys appear stable  without suspicious findings. No evidence of urinary tract calculus or hydronephrosis. The bladder appears normal. Stomach/Bowel: No evidence of bowel wall thickening, distention or surrounding inflammatory change. The appendix appears normal. Mild diverticular changes are present in the distal colon. Vascular/Lymphatic: As above, there is a new mass between the uncinate process of the pancreas and the transverse duodenum which may reflect a nodal mass. No other enlarged abdominopelvic lymph nodes are seen. There is extensive aortic and branch vessel atherosclerosis. Probable chronically occluded graft in the right groin, unchanged from previous study. Evidence of underlying significant SFA narrowing bilaterally, similar to previous study. Reproductive: The prostate gland and seminal vesicles appear normal. Other: No evidence of abdominal wall mass or hernia. No ascites. Musculoskeletal: No acute or significant osseous findings. Ill-defined sclerosis in the left iliac bone (best seen on coronal image 105) is stable. IMPRESSION: 1. Evolving radiation changes in the left lung. No evidence of local recurrence or metastatic disease in the chest. 2. New retroperitoneal mass between the pancreatic head and transverse duodenum of uncertain etiology. This could reflect a pseudocyst if the patient has had interval pancreatitis. However, primary pancreatic neoplasm and nodal metastasis from the patient's lung cancer are definite possibilities. Endoscopic ultrasound suggested for further evaluation and tissue sampling if there is no clinical suspicion of recent pancreatitis. 3. No other evidence of metastatic disease in the abdomen or pelvis. 4. Extensive atherosclerosis with areas of chronic luminal narrowing affecting the left common carotid artery and both proximal femoral arteries. Aortic Atherosclerosis (ICD10-I70.0). Electronically Signed   By: Richardean Sale M.D.   On: 09/23/2017 14:37   Ct Abdomen Pelvis W  Contrast  Result Date: 09/23/2017 CLINICAL DATA:  Small cell lung cancer on the left diagnosed in 2018. Chemotherapy completed. EXAM: CT CHEST, ABDOMEN, AND PELVIS WITH CONTRAST TECHNIQUE: Multidetector CT imaging of the chest, abdomen and pelvis was performed following the standard protocol during bolus administration of intravenous contrast. CONTRAST:  16mL ISOVUE-300 IOPAMIDOL (ISOVUE-300) INJECTION 61% COMPARISON:  CT 06/19/2017. FINDINGS: CT CHEST FINDINGS Cardiovascular: Diffuse atherosclerosis of the aorta, great vessels and coronary arteries. There is stable intimal irregularity at the origin of the left common carotid artery. No evidence of large vessel occlusion or other acute vascular findings. Right IJ Port-A-Cath extends to the superior cavoatrial junction. The heart size is normal. There is no pericardial effusion. Mediastinum/Nodes: There are no enlarged mediastinal, hilar or axillary lymph nodes. The thyroid gland, trachea and esophagus demonstrate no significant findings. Lungs/Pleura: There are diffuse pleural calcifications bilaterally and a stable small left pleural effusion. There is stable volume loss in the left hemithorax with paramediastinal bronchiectasis and scarring  attributed to previous radiation therapy. No recurrent mass lesion identified. There are no suspicious pulmonary nodules. The right lung is clear. Musculoskeletal/Chest wall: No chest wall mass or suspicious osseous findings. CT ABDOMEN AND PELVIS FINDINGS Hepatobiliary: The liver is normal in density without focal abnormality. No evidence of gallstones, gallbladder wall thickening or biliary dilatation. Pancreas: There is a new mass inferior to the uncinate process of the pancreas and superior to the transverse duodenum. This measures 4.6 x 3.3 cm on image 72. There is no clear fat plane between this mass and the pancreas. However, there is no pancreatic or biliary ductal dilatation. The pancreas otherwise appears normal.  There are no surrounding inflammatory changes. Spleen: Normal in size without focal abnormality. Adrenals/Urinary Tract: Both adrenal glands appear normal. The kidneys appear stable without suspicious findings. No evidence of urinary tract calculus or hydronephrosis. The bladder appears normal. Stomach/Bowel: No evidence of bowel wall thickening, distention or surrounding inflammatory change. The appendix appears normal. Mild diverticular changes are present in the distal colon. Vascular/Lymphatic: As above, there is a new mass between the uncinate process of the pancreas and the transverse duodenum which may reflect a nodal mass. No other enlarged abdominopelvic lymph nodes are seen. There is extensive aortic and branch vessel atherosclerosis. Probable chronically occluded graft in the right groin, unchanged from previous study. Evidence of underlying significant SFA narrowing bilaterally, similar to previous study. Reproductive: The prostate gland and seminal vesicles appear normal. Other: No evidence of abdominal wall mass or hernia. No ascites. Musculoskeletal: No acute or significant osseous findings. Ill-defined sclerosis in the left iliac bone (best seen on coronal image 105) is stable. IMPRESSION: 1. Evolving radiation changes in the left lung. No evidence of local recurrence or metastatic disease in the chest. 2. New retroperitoneal mass between the pancreatic head and transverse duodenum of uncertain etiology. This could reflect a pseudocyst if the patient has had interval pancreatitis. However, primary pancreatic neoplasm and nodal metastasis from the patient's lung cancer are definite possibilities. Endoscopic ultrasound suggested for further evaluation and tissue sampling if there is no clinical suspicion of recent pancreatitis. 3. No other evidence of metastatic disease in the abdomen or pelvis. 4. Extensive atherosclerosis with areas of chronic luminal narrowing affecting the left common carotid  artery and both proximal femoral arteries. Aortic Atherosclerosis (ICD10-I70.0). Electronically Signed   By: Richardean Sale M.D.   On: 09/23/2017 14:37    ASSESSMENT AND PLAN:  This is a very pleasant 67 years old white male with extensive stage small cell lung cancer completed 6 cycles of systemic chemotherapy with carboplatin and etoposide concurrent with radiation to the large left upper lobe mass.  The patient tolerated the treatment well except for the chemotherapy-induced pancytopenia and significant anemia. This was followed by prophylactic cranial irradiation. The patient is currently on observation and he is feeling fine. Repeat CT scan of the chest, abdomen and pelvis showed no concerning findings for disease recurrence in the chest but the patient has new retroperitoneal mass between the pancreatic head and transverse duodenum.  This is suspicious for recurrent small cell lung cancer versus pancreatic malignancy.  I personally and independently reviewed the scan images and discussed the results and showed the images to the patient and his wife. I recommended for the patient to see Dr. Oretha Caprice for consideration of endoscopic ultrasound and biopsy of the pancreatic mass. I will see him back for follow-up visit in 3 weeks for reevaluation and discussion of his treatment options based on the  biopsy results. He was advised to call immediately if he has any concerning symptoms in the interval. The patient voices understanding of current disease status and treatment options and is in agreement with the current care plan. All questions were answered. The patient knows to call the clinic with any problems, questions or concerns. We can certainly see the patient much sooner if necessary.  Disclaimer: This note was dictated with voice recognition software. Similar sounding words can inadvertently be transcribed and may not be corrected upon review.

## 2017-10-05 ENCOUNTER — Telehealth: Payer: Self-pay

## 2017-10-05 NOTE — Telephone Encounter (Signed)
-----   Message from Jeoffrey Massed, RN sent at 09/28/2017  2:57 PM EST ----- RE: Jesse Avila DOB: 1950/10/08 MRN: [id number]   Dear Dr Rockey Situ,    We have scheduled the above patient for an endoscopic procedure with Dr Ardis Hughs. Our records show that he is on anticoagulation therapy.   Please advise as to how long the patient may come off his therapy of Eliquis prior to the procedure, which is scheduled for 10/08/17.  Please fax back/ or route to Lamiya Naas at [phone].   Sincerely,    Jeoffrey Massed RN

## 2017-10-05 NOTE — Telephone Encounter (Signed)
Sent anti coag letter to [facility]

## 2017-10-06 NOTE — Telephone Encounter (Signed)
I spoke with the pt and

## 2017-10-06 NOTE — Telephone Encounter (Signed)
Left message with pt to return call regarding eliquis hold for procedure.  Letter also resent to Dr Rockey Situ

## 2017-10-07 ENCOUNTER — Other Ambulatory Visit: Payer: Self-pay

## 2017-10-07 ENCOUNTER — Encounter: Payer: Self-pay | Admitting: Physician Assistant

## 2017-10-07 ENCOUNTER — Encounter (HOSPITAL_COMMUNITY): Payer: Self-pay | Admitting: Emergency Medicine

## 2017-10-07 NOTE — Telephone Encounter (Signed)
Several messages have been left for the pt to return call regarding eliquis hold.

## 2017-10-07 NOTE — Progress Notes (Signed)
Anticoagulation  Received: Today  Message Contents  Dunn, Nedra Hai, PA-C  Jeoffrey Massed, RN        Hi Ms. Jesse Avila, My name is Jesse Copa PA-C and I am covering our pre-op pool today.   I do not see where the letter came across to our pre-op pool. I am not sure how to tell under Letters where exactly it was routed. FYI, [facility] is an internal pool typically used for [facility] use only, via the Patient Calls (telephone note) system that are put in by our triage staff. I do not see any phone notes that came through with this request. I spoke with Dr. Rockey Situ given urgent nature of the message. This patient has history of stroke and left atrial appendage thrombus so is at higher risk for event if he remains off anticoagulation, but it seems as though this procedure is quite necessary to further evaluate his pancreatic mass - given this information, Dr. Rockey Situ feels it is OK to hold Eliquis 2 days prior to procedure and he would recommend to resume as soon as possible at the discretion of the operating physician. Depending on when the patient begins holding, this may affect your scheduling. I will also post this in a note for documentation from our end. Please call the [place] office at [phone] with any questions, thanks!   Jesse Copa PA-C   Previous Messages    ----- Message -----  From: Jeoffrey Massed, RN  Sent: 10/05/2017  9:16 AM  To: [facility]   Please respond ASAP pt has procedure on 3/7 Thanks

## 2017-10-07 NOTE — Telephone Encounter (Signed)
-----   Message from Charlie Pitter, PA-C sent at 10/07/2017  1:16 PM EST ----- Regarding: Anticoagulation Hi Ms. Lovena Le, My name is Melina Copa PA-C and I am covering our pre-op pool today.  I do not see where the letter came across to our pre-op pool. I am not sure how to tell under Letters where exactly it was routed. FYI, [facility] is an internal pool typically used for [facility] use only, via the Patient Calls (telephone note) system that are put in by our triage staff. I do not see any phone notes that came through with this request. I spoke with Dr. Rockey Situ given urgent nature of the message. This patient has history of stroke and left atrial appendage thrombus so is at higher risk for event if he remains off anticoagulation, but it seems as though this procedure is quite necessary to further evaluate his pancreatic mass - given this information, Dr. Rockey Situ feels it is OK to hold Eliquis 2 days prior to procedure and he would recommend to resume as soon as possible at the discretion of the operating physician. Depending on when the patient begins holding, this may affect your scheduling. I will also post this in a note for documentation from our end. Please call the [place] office at [phone] with any questions, thanks!  Melina Copa PA-C  ----- Message ----- From: Jeoffrey Massed, RN Sent: 10/05/2017   9:16 AM To: [facility]  Please respond ASAP pt has procedure on 3/7 Thanks

## 2017-10-07 NOTE — Telephone Encounter (Signed)
Left message on machine to call back I received fax from Dr Marisue Humble that he can hold eliquis 2 days prior to the procedure.

## 2017-10-07 NOTE — Telephone Encounter (Signed)
The pt will need to arrive at 10 am instead of 1230 due to a change in the schedule.

## 2017-10-08 ENCOUNTER — Encounter (HOSPITAL_COMMUNITY): Payer: Self-pay | Admitting: *Deleted

## 2017-10-08 ENCOUNTER — Other Ambulatory Visit: Payer: Self-pay

## 2017-10-08 ENCOUNTER — Ambulatory Visit (HOSPITAL_COMMUNITY)
Admission: RE | Admit: 2017-10-08 | Discharge: 2017-10-08 | Disposition: A | Discharge status: Home / Self Care | Payer: BLUE CROSS/BLUE SHIELD | Source: Ambulatory Visit | Attending: Gastroenterology | Admitting: Gastroenterology

## 2017-10-08 ENCOUNTER — Encounter (HOSPITAL_COMMUNITY)
Admission: RE | Disposition: A | Discharge status: Home / Self Care | Payer: Self-pay | Source: Ambulatory Visit | Attending: Gastroenterology

## 2017-10-08 ENCOUNTER — Ambulatory Visit (HOSPITAL_COMMUNITY): Payer: BLUE CROSS/BLUE SHIELD | Admitting: Anesthesiology

## 2017-10-08 DIAGNOSIS — C3492 Malignant neoplasm of unspecified part of left bronchus or lung: Secondary | ICD-10-CM | POA: Diagnosis not present

## 2017-10-08 DIAGNOSIS — Z79899 Other long term (current) drug therapy: Secondary | ICD-10-CM | POA: Insufficient documentation

## 2017-10-08 DIAGNOSIS — I70219 Atherosclerosis of native arteries of extremities with intermittent claudication, unspecified extremity: Secondary | ICD-10-CM | POA: Insufficient documentation

## 2017-10-08 DIAGNOSIS — C257 Malignant neoplasm of other parts of pancreas: Secondary | ICD-10-CM | POA: Diagnosis not present

## 2017-10-08 DIAGNOSIS — C781 Secondary malignant neoplasm of mediastinum: Secondary | ICD-10-CM | POA: Insufficient documentation

## 2017-10-08 DIAGNOSIS — I4891 Unspecified atrial fibrillation: Secondary | ICD-10-CM | POA: Insufficient documentation

## 2017-10-08 DIAGNOSIS — Z7901 Long term (current) use of anticoagulants: Secondary | ICD-10-CM | POA: Insufficient documentation

## 2017-10-08 DIAGNOSIS — Z888 Allergy status to other drugs, medicaments and biological substances status: Secondary | ICD-10-CM | POA: Diagnosis not present

## 2017-10-08 DIAGNOSIS — E785 Hyperlipidemia, unspecified: Secondary | ICD-10-CM | POA: Diagnosis not present

## 2017-10-08 DIAGNOSIS — K869 Disease of pancreas, unspecified: Secondary | ICD-10-CM | POA: Diagnosis not present

## 2017-10-08 DIAGNOSIS — I10 Essential (primary) hypertension: Secondary | ICD-10-CM | POA: Diagnosis not present

## 2017-10-08 DIAGNOSIS — Z923 Personal history of irradiation: Secondary | ICD-10-CM | POA: Diagnosis not present

## 2017-10-08 DIAGNOSIS — K8689 Other specified diseases of pancreas: Secondary | ICD-10-CM | POA: Diagnosis not present

## 2017-10-08 DIAGNOSIS — C779 Secondary and unspecified malignant neoplasm of lymph node, unspecified: Secondary | ICD-10-CM | POA: Insufficient documentation

## 2017-10-08 DIAGNOSIS — Z8673 Personal history of transient ischemic attack (TIA), and cerebral infarction without residual deficits: Secondary | ICD-10-CM | POA: Insufficient documentation

## 2017-10-08 DIAGNOSIS — E1151 Type 2 diabetes mellitus with diabetic peripheral angiopathy without gangrene: Secondary | ICD-10-CM | POA: Insufficient documentation

## 2017-10-08 DIAGNOSIS — C7A8 Other malignant neuroendocrine tumors: Secondary | ICD-10-CM

## 2017-10-08 DIAGNOSIS — I6529 Occlusion and stenosis of unspecified carotid artery: Secondary | ICD-10-CM | POA: Diagnosis not present

## 2017-10-08 DIAGNOSIS — Z794 Long term (current) use of insulin: Secondary | ICD-10-CM | POA: Diagnosis not present

## 2017-10-08 HISTORY — PX: EUS: SHX5427

## 2017-10-08 LAB — GLUCOSE, CAPILLARY
GLUCOSE-CAPILLARY: 295 mg/dL — AB (ref 65–99)
Glucose-Capillary: 252 mg/dL — ABNORMAL HIGH (ref 65–99)

## 2017-10-08 SURGERY — UPPER ENDOSCOPIC ULTRASOUND (EUS) LINEAR
Anesthesia: Monitor Anesthesia Care

## 2017-10-08 MED ORDER — ONDANSETRON HCL 4 MG/2ML IJ SOLN
INTRAMUSCULAR | Status: DC | PRN
  Administered 2017-10-08: 4 mg via INTRAVENOUS

## 2017-10-08 MED ORDER — PHENYLEPHRINE HCL 10 MG/ML IJ SOLN
INTRAMUSCULAR | Status: DC | PRN
  Administered 2017-10-08 (×2): 80 ug via INTRAVENOUS

## 2017-10-08 MED ORDER — INSULIN ASPART 100 UNIT/ML ~~LOC~~ SOLN
8.0000 [IU] | Freq: Once | SUBCUTANEOUS | Status: AC
  Administered 2017-10-08: 8 [IU] via SUBCUTANEOUS

## 2017-10-08 MED ORDER — PROPOFOL 500 MG/50ML IV EMUL
INTRAVENOUS | Status: DC | PRN
  Administered 2017-10-08: 125 ug/kg/min via INTRAVENOUS

## 2017-10-08 MED ORDER — PROPOFOL 500 MG/50ML IV EMUL
INTRAVENOUS | Status: DC | PRN
  Administered 2017-10-08: 10 mg via INTRAVENOUS
  Administered 2017-10-08: 40 mg via INTRAVENOUS

## 2017-10-08 MED ORDER — LACTATED RINGERS IV SOLN
INTRAVENOUS | Status: DC
  Administered 2017-10-08: 1000 mL via INTRAVENOUS

## 2017-10-08 MED ORDER — INSULIN ASPART 100 UNIT/ML ~~LOC~~ SOLN
SUBCUTANEOUS | Status: AC
  Filled 2017-10-08: qty 1

## 2017-10-08 MED ORDER — PROPOFOL 10 MG/ML IV BOLUS
INTRAVENOUS | Status: AC
  Filled 2017-10-08: qty 20

## 2017-10-08 MED ORDER — PROPOFOL 10 MG/ML IV BOLUS
INTRAVENOUS | Status: AC
  Filled 2017-10-08: qty 40

## 2017-10-08 MED ORDER — SODIUM CHLORIDE 0.9 % IV SOLN: INTRAVENOUS | Status: DC

## 2017-10-08 NOTE — Discharge Instructions (Signed)
YOU HAD AN ENDOSCOPIC PROCEDURE TODAY: Refer to the procedure report and other information in the discharge instructions given to you for any specific questions about what was found during the examination. If this information does not answer your questions, please call [place] office at [phone] to clarify.  ° °YOU SHOULD EXPECT: Some feelings of bloating in the abdomen. Passage of more gas than usual. Walking can help get rid of the air that was put into your GI tract during the procedure and reduce the bloating. If you had a lower endoscopy (such as a colonoscopy or flexible sigmoidoscopy) you may notice spotting of blood in your stool or on the toilet paper. Some abdominal soreness may be present for a day or two, also. ° °DIET: Your first meal following the procedure should be a light meal and then it is ok to progress to your normal diet. A half-sandwich or bowl of soup is an example of a good first meal. Heavy or fried foods are harder to digest and may make you feel nauseous or bloated. Drink plenty of fluids but you should avoid alcoholic beverages for 24 hours. If you had a esophageal dilation, please see attached instructions for diet.   ° °ACTIVITY: Your care partner should take you home directly after the procedure. You should plan to take it easy, moving slowly for the rest of the day. You can resume normal activity the day after the procedure however YOU SHOULD NOT DRIVE, use power tools, machinery or perform tasks that involve climbing or major physical exertion for 24 hours (because of the sedation medicines used during the test).  ° °SYMPTOMS TO REPORT IMMEDIATELY: °A gastroenterologist can be reached at any hour. Please call [phone]  for any of the following symptoms:  °Following lower endoscopy (colonoscopy, flexible sigmoidoscopy) °Excessive amounts of blood in the stool  °Significant tenderness, worsening of abdominal pains  °Swelling of the abdomen that is new, acute  °Fever of 100° or  higher  °Following upper endoscopy (EGD, EUS, ERCP, esophageal dilation) °Vomiting of blood or coffee ground material  °New, significant abdominal pain  °New, significant chest pain or pain under the shoulder blades  °Painful or persistently difficult swallowing  °New shortness of breath  °Black, tarry-looking or red, bloody stools ° °FOLLOW UP:  °If any biopsies were taken you will be contacted by phone or by letter within the next 1-3 weeks. Call [phone]  if you have not heard about the biopsies in 3 weeks.  °Please also call with any specific questions about appointments or follow up tests. ° °

## 2017-10-08 NOTE — Anesthesia Preprocedure Evaluation (Addendum)
Anesthesia Evaluation  Patient identified by MRN, date of birth, ID band Patient awake    Reviewed: Allergy & Precautions, NPO status , Patient's Chart, lab work & pertinent test results  Airway Mallampati: II  TM Distance: >3 FB Neck ROM: Full    Dental   Pulmonary COPD, former smoker,    breath sounds clear to auscultation       Cardiovascular hypertension, Pt. on medications + Peripheral Vascular Disease and +CHF   Rhythm:Regular Rate:Normal  Pericardial effusion-moderate. EF 40-45%   Neuro/Psych CVA    GI/Hepatic Neg liver ROS, GERD  ,  Endo/Other  diabetes, Type 2, Insulin Dependent  Renal/GU Renal disease     Musculoskeletal  (+) Arthritis ,   Abdominal   Peds  Hematology  (+) anemia ,   Anesthesia Other Findings   Reproductive/Obstetrics                             Anesthesia Physical Anesthesia Plan  ASA: III  Anesthesia Plan: MAC   Post-op Pain Management:    Induction: Intravenous  PONV Risk Score and Plan: 1 and Propofol infusion, Ondansetron and Treatment may vary due to age or medical condition  Airway Management Planned: Natural Airway and Nasal Cannula  Additional Equipment:   Intra-op Plan:   Post-operative Plan:   Informed Consent: I have reviewed the patients History and Physical, chart, labs and discussed the procedure including the risks, benefits and alternatives for the proposed anesthesia with the patient or authorized representative who has indicated his/her understanding and acceptance.     Plan Discussed with:   Anesthesia Plan Comments:         Anesthesia Quick Evaluation

## 2017-10-08 NOTE — Interval H&P Note (Signed)
History and Physical Interval Note:  10/08/2017 11:44 AM  Jesse Avila  has presented today for surgery, with the diagnosis of pancreatic mass  The various methods of treatment have been discussed with the patient and family. After consideration of risks, benefits and other options for treatment, the patient has consented to  Procedure(s): UPPER ENDOSCOPIC ULTRASOUND (EUS) LINEAR (N/A) as a surgical intervention .  The patient's history has been reviewed, patient examined, no change in status, stable for surgery.  I have reviewed the patient's chart and labs.  Questions were answered to the patient's satisfaction.     [name]

## 2017-10-08 NOTE — Telephone Encounter (Signed)
I spoke with [facility] endo and they have spoken to the pt and confirmed he did stop eliquis and will arrive early

## 2017-10-08 NOTE — Anesthesia Procedure Notes (Signed)
Date/Time: 10/08/2017 11:48 AM Performed by: Glory Buff, CRNA Oxygen Delivery Method: Nasal cannula

## 2017-10-08 NOTE — Op Note (Signed)
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 Estimated blood loss:                            None. Estimated Blood Loss:     Estimated blood loss: none. Procedure:                Pre-Anesthesia Assessment:                           - Prior to the procedure, a History and Physical                            was performed, and patient medications and                            allergies were reviewed. The patient's tolerance of                            previous anesthesia was also reviewed. The risks                            and benefits of the procedure and the sedation                            options and risks were discussed with the patient.                            All questions were answered, and informed consent                            was obtained. Prior Anticoagulants: The patient has                            taken Eliquis (apixaban), last dose was 3 days                            prior  to procedure. ASA Grade Assessment: III - A                            patient with severe systemic disease. After                            reviewing the risks and benefits, the patient was                            deemed in satisfactory condition to undergo the                            procedure.                           After obtaining informed consent, the endoscope was   passed under direct vision. Throughout the                            procedure, the patient's blood pressure, pulse, and                            oxygen saturations were monitored continuously. The                            [id number] ([id number]) scope was introduced through                            the mouth, and advanced to the second part of                            duodenum. The [id number] ([id number]) scope was                            introduced through the mouth, and advanced to the                            second part of duodenum. The upper EUS was                            accomplished without difficulty. The patient                            tolerated the procedure well. Findings:      Endoscopic Finding (limited views with radial, linear echoedoscopes). :      The examined esophagus was endoscopically normal.      The entire examined stomach was endoscopically normal.      The examined duodenum was endoscopically normal.      Endosonographic Finding :      1. An irregular mass was identified in the region of the uncinate       pancreas. This mass does not clearly originate from the pancreas. The       mass was hypoechoic, heterogenous and lobular. The mass measured 45 mm       in maximal cross-sectional diameter. The  endosonographic borders were       poorly-defined. Fine needle aspiration for cytology was performed. Color       Doppler imaging was utilized prior to needle puncture to confirm a lack       of significant vascular structures within the needle path. Two passes       were made with the 25 gauge needle using a transduodenal approach. A       cytotechnologist was present to evaluate the adequacy of the specimen.      2. Pancreatic parenchyma was normal (in uncinate, head, neck, body and       tail).      3. Main pancreatic duct was normal.      4. CBD was normal, non-dilated      5. Limited views of the liver, spleen, portal and splenic vessels were        all normal. Impression:               - 4.5cm mass in the region of the uncinate                            pancreas, without clear signs of a pancreatic                            parenchymal mass.                           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 Moderate Sedation:      N/A- Per Anesthesia Care Recommendation:           - Discharge patient to home (ambulatory).                           - OK to resume your eliquis tomorrow. Procedure Code(s):        --- Professional ---                           [phone], Esophagogastroduodenoscopy, flexible,                            transoral; with transendoscopic ultrasound-guided                            intramural or transmural fine needle                            aspiration/biopsy(s), (includes endoscopic                            ultrasound examination limited to the esophagus,                            stomach or duodenum, and adjacent structures) Diagnosis Code(s):        ---  Professional ---                           K86.89, Other specified diseases of pancreas                           R93.3, Abnormal findings on diagnostic imaging of                            other parts of digestive tract CPT copyright 2016 [employer]. All rights reserved. The codes documented in this report are preliminary and upon coder review may  be revised to meet current compliance requirements. [name], MD 10/08/2017 12:48:46 PM This report has been signed electronically. Number of Addenda: 0

## 2017-10-08 NOTE — Telephone Encounter (Signed)
Left message on machine to call back  

## 2017-10-08 NOTE — Transfer of Care (Signed)
Immediate Anesthesia Transfer of Care Note  Patient: Jesse Avila  Procedure(s) Performed: UPPER ENDOSCOPIC ULTRASOUND (EUS) LINEAR (N/A )  Patient Location: PACU  Anesthesia Type:MAC  Level of Consciousness: awake, alert  and oriented  Airway & Oxygen Therapy: Patient Spontanous Breathing and Patient connected to nasal cannula oxygen  Post-op Assessment: Report given to RN and Post -op Vital signs reviewed and stable  Post vital signs: Reviewed and stable  Last Vitals:  Vitals:   10/08/17 1113  BP: 139/83  Pulse: 83  Resp: (!) 22  SpO2: 92%    Last Pain:  Vitals:   10/08/17 1113  TempSrc: Oral         Complications: No apparent anesthesia complications

## 2017-10-08 NOTE — Anesthesia Postprocedure Evaluation (Signed)
Anesthesia Post Note  Patient: Jesse Avila  Procedure(s) Performed: UPPER ENDOSCOPIC ULTRASOUND (EUS) LINEAR (N/A )     Patient location during evaluation: PACU Anesthesia Type: MAC Level of consciousness: awake and alert Pain management: pain level controlled Vital Signs Assessment: post-procedure vital signs reviewed and stable Respiratory status: spontaneous breathing, nonlabored ventilation, respiratory function stable and patient connected to nasal cannula oxygen Cardiovascular status: stable and blood pressure returned to baseline Postop Assessment: no apparent nausea or vomiting Anesthetic complications: no    Last Vitals:  Vitals:   10/08/17 1310 10/08/17 1320  BP: 105/67 108/62  Pulse: 67 69  Resp: 18 (!) 24  Temp:    SpO2: 97% 94%    Last Pain:  Vitals:   10/08/17 1300  TempSrc: Oral                 Tiajuana Amass

## 2017-10-09 NOTE — Telephone Encounter (Signed)
Thank you Hinton Dyer for addressing the preop I found the request your were look ing for  For some reason the note from Katina Degree came to my "[place]" It will never be found in that basket as that is where Coumadin clinic and miscellaneous "CCd" notes go That is not usually the basket preops go to  How do a direct outside offices to send preop request? thx TGollan

## 2017-10-16 ENCOUNTER — Other Ambulatory Visit: Payer: Self-pay | Admitting: Medical Oncology

## 2017-10-16 DIAGNOSIS — C3492 Malignant neoplasm of unspecified part of left bronchus or lung: Secondary | ICD-10-CM

## 2017-10-19 ENCOUNTER — Inpatient Hospital Stay: Payer: BLUE CROSS/BLUE SHIELD

## 2017-10-19 ENCOUNTER — Inpatient Hospital Stay: Payer: BLUE CROSS/BLUE SHIELD | Attending: Internal Medicine | Admitting: Internal Medicine

## 2017-10-19 ENCOUNTER — Encounter: Payer: Self-pay | Admitting: Internal Medicine

## 2017-10-19 ENCOUNTER — Other Ambulatory Visit: Payer: Self-pay | Admitting: Medical Oncology

## 2017-10-19 VITALS — BP 97/65 | HR 98 | Temp 98.2°F | Resp 16 | Ht 73.0 in | Wt 165.8 lb

## 2017-10-19 DIAGNOSIS — C3492 Malignant neoplasm of unspecified part of left bronchus or lung: Secondary | ICD-10-CM

## 2017-10-19 DIAGNOSIS — N189 Chronic kidney disease, unspecified: Secondary | ICD-10-CM

## 2017-10-19 DIAGNOSIS — Z85118 Personal history of other malignant neoplasm of bronchus and lung: Secondary | ICD-10-CM

## 2017-10-19 DIAGNOSIS — C7951 Secondary malignant neoplasm of bone: Secondary | ICD-10-CM | POA: Insufficient documentation

## 2017-10-19 DIAGNOSIS — Z5111 Encounter for antineoplastic chemotherapy: Secondary | ICD-10-CM | POA: Diagnosis present

## 2017-10-19 DIAGNOSIS — M549 Dorsalgia, unspecified: Secondary | ICD-10-CM | POA: Diagnosis not present

## 2017-10-19 DIAGNOSIS — Z713 Dietary counseling and surveillance: Secondary | ICD-10-CM

## 2017-10-19 DIAGNOSIS — C3402 Malignant neoplasm of left main bronchus: Secondary | ICD-10-CM

## 2017-10-19 DIAGNOSIS — C7889 Secondary malignant neoplasm of other digestive organs: Secondary | ICD-10-CM | POA: Insufficient documentation

## 2017-10-19 DIAGNOSIS — I10 Essential (primary) hypertension: Secondary | ICD-10-CM

## 2017-10-19 DIAGNOSIS — Z5112 Encounter for antineoplastic immunotherapy: Secondary | ICD-10-CM | POA: Insufficient documentation

## 2017-10-19 DIAGNOSIS — N289 Disorder of kidney and ureter, unspecified: Secondary | ICD-10-CM

## 2017-10-19 DIAGNOSIS — R634 Abnormal weight loss: Secondary | ICD-10-CM | POA: Diagnosis not present

## 2017-10-19 DIAGNOSIS — G893 Neoplasm related pain (acute) (chronic): Secondary | ICD-10-CM

## 2017-10-19 DIAGNOSIS — E876 Hypokalemia: Secondary | ICD-10-CM | POA: Insufficient documentation

## 2017-10-19 DIAGNOSIS — Z95828 Presence of other vascular implants and grafts: Secondary | ICD-10-CM

## 2017-10-19 DIAGNOSIS — I959 Hypotension, unspecified: Secondary | ICD-10-CM | POA: Diagnosis not present

## 2017-10-19 DIAGNOSIS — C3411 Malignant neoplasm of upper lobe, right bronchus or lung: Secondary | ICD-10-CM | POA: Diagnosis present

## 2017-10-19 DIAGNOSIS — N179 Acute kidney failure, unspecified: Secondary | ICD-10-CM

## 2017-10-19 DIAGNOSIS — E860 Dehydration: Secondary | ICD-10-CM

## 2017-10-19 LAB — CBC WITH DIFFERENTIAL (CANCER CENTER ONLY)
BASOS PCT: 0 %
Basophils Absolute: 0 10*3/uL (ref 0.0–0.1)
EOS ABS: 0 10*3/uL (ref 0.0–0.5)
Eosinophils Relative: 0 %
HEMATOCRIT: 31.7 % — AB (ref 38.4–49.9)
HEMOGLOBIN: 10.8 g/dL — AB (ref 13.0–17.1)
Lymphocytes Relative: 13 %
Lymphs Abs: 1.2 10*3/uL (ref 0.9–3.3)
MCH: 32.4 pg (ref 27.2–33.4)
MCHC: 34.1 g/dL (ref 32.0–36.0)
MCV: 95.2 fL (ref 79.3–98.0)
Monocytes Absolute: 0.6 10*3/uL (ref 0.1–0.9)
Monocytes Relative: 6 %
NEUTROS ABS: 7.6 10*3/uL — AB (ref 1.5–6.5)
NEUTROS PCT: 81 %
Platelet Count: 163 10*3/uL (ref 140–400)
RBC: 3.33 MIL/uL — AB (ref 4.20–5.82)
RDW: 14.8 % — ABNORMAL HIGH (ref 11.0–14.6)
WBC: 9.4 10*3/uL (ref 4.0–10.3)

## 2017-10-19 LAB — CMP (CANCER CENTER ONLY)
ALT: 8 U/L (ref 0–55)
ANION GAP: 17 — AB (ref 3–11)
AST: 10 U/L (ref 5–34)
Albumin: 3.4 g/dL — ABNORMAL LOW (ref 3.5–5.0)
Alkaline Phosphatase: 99 U/L (ref 40–150)
BUN: 36 mg/dL — ABNORMAL HIGH (ref 7–26)
CO2: 28 mmol/L (ref 22–29)
CREATININE: 2.78 mg/dL — AB (ref 0.70–1.30)
Calcium: 10.6 mg/dL — ABNORMAL HIGH (ref 8.4–10.4)
Chloride: 90 mmol/L — ABNORMAL LOW (ref 98–109)
GFR, EST AFRICAN AMERICAN: 26 mL/min — AB (ref >60)
GFR, EST NON AFRICAN AMERICAN: 23 mL/min — AB (ref >60)
Glucose, Bld: 265 mg/dL — ABNORMAL HIGH (ref 70–140)
Potassium: 2.5 mmol/L — CL (ref 3.5–5.1)
SODIUM: 135 mmol/L — AB (ref 136–145)
Total Bilirubin: 0.5 mg/dL (ref 0.2–1.2)
Total Protein: 8.3 g/dL (ref 6.4–8.3)

## 2017-10-19 MED ORDER — SODIUM CHLORIDE 0.9% FLUSH
10.0000 mL | INTRAVENOUS | Status: DC | PRN
  Administered 2017-10-19: 10 mL via INTRAVENOUS
  Filled 2017-10-19: qty 10

## 2017-10-19 MED ORDER — HEPARIN SOD (PORK) LOCK FLUSH 100 UNIT/ML IV SOLN
500.0000 [IU] | Freq: Once | INTRAVENOUS | Status: AC | PRN
  Administered 2017-10-19: 500 [IU] via INTRAVENOUS
  Filled 2017-10-19: qty 5

## 2017-10-19 MED ORDER — OXYCODONE-ACETAMINOPHEN 5-325 MG PO TABS
1.0000 | ORAL_TABLET | ORAL | Status: AC
  Administered 2017-10-19: 1 via ORAL

## 2017-10-19 MED ORDER — POTASSIUM CHLORIDE ER 20 MEQ PO TBCR: 10.0000 meq | EXTENDED_RELEASE_TABLET | Freq: Every day | ORAL | 0 refills | Status: DC

## 2017-10-19 MED ORDER — OXYCODONE-ACETAMINOPHEN 5-325 MG PO TABS: 1.0000 | ORAL_TABLET | ORAL | Status: DC

## 2017-10-19 MED ORDER — METHYLPREDNISOLONE 4 MG PO TBPK: ORAL_TABLET | ORAL | 0 refills | Status: DC

## 2017-10-19 MED ORDER — OXYCODONE-ACETAMINOPHEN 5-325 MG PO TABS
ORAL_TABLET | ORAL | Status: AC
  Filled 2017-10-19: qty 1

## 2017-10-19 MED ORDER — HYDROCODONE-ACETAMINOPHEN 5-325 MG PO TABS: 1.0000 | ORAL_TABLET | Freq: Four times a day (QID) | ORAL | 0 refills | Status: DC | PRN

## 2017-10-19 MED ORDER — SODIUM CHLORIDE 0.9 % IV SOLN
Freq: Once | INTRAVENOUS | Status: AC
  Administered 2017-10-19: 15:00:00 via INTRAVENOUS
  Filled 2017-10-19: qty 1000

## 2017-10-19 NOTE — Patient Instructions (Signed)
Dehydration, Adult Dehydration is a condition in which there is not enough fluid or water in the body. This happens when you lose more fluids than you take in. Important organs, such as the kidneys, brain, and heart, cannot function without a proper amount of fluids. Any loss of fluids from the body can lead to dehydration. Dehydration can range from mild to severe. This condition should be treated right away to prevent it from becoming severe. What are the causes? This condition may be caused by:  Vomiting.  Diarrhea.  Excessive sweating, such as from heat exposure or exercise.  Not drinking enough fluid, especially: ? When ill. ? While doing activity that requires a lot of energy.  Excessive urination.  Fever.  Infection.  Certain medicines, such as medicines that cause the body to lose excess fluid (diuretics).  Inability to access safe drinking water.  Reduced physical ability to get adequate water and food.  What increases the risk? This condition is more likely to develop in people:  Who have a poorly controlled long-term (chronic) illness, such as diabetes, heart disease, or kidney disease.  Who are age 65 or older.  Who are disabled.  Who live in a place with high altitude.  Who play endurance sports.  What are the signs or symptoms? Symptoms of mild dehydration may include:  Thirst.  Dry lips.  Slightly dry mouth.  Dry, warm skin.  Dizziness. Symptoms of moderate dehydration may include:  Very dry mouth.  Muscle cramps.  Dark urine. Urine may be the color of tea.  Decreased urine production.  Decreased tear production.  Heartbeat that is irregular or faster than normal (palpitations).  Headache.  Light-headedness, especially when you stand up from a sitting position.  Fainting (syncope). Symptoms of severe dehydration may include:  Changes in skin, such as: ? Cold and clammy skin. ? Blotchy (mottled) or pale skin. ? Skin that does  not quickly return to normal after being lightly pinched and released (poor skin turgor).  Changes in body fluids, such as: ? Extreme thirst. ? No tear production. ? Inability to sweat when body temperature is high, such as in hot weather. ? Very little urine production.  Changes in vital signs, such as: ? Weak pulse. ? Pulse that is more than 100 beats a minute when sitting still. ? Rapid breathing. ? Low blood pressure.  Other changes, such as: ? Sunken eyes. ? Cold hands and feet. ? Confusion. ? Lack of energy (lethargy). ? Difficulty waking up from sleep. ? Short-term weight loss. ? Unconsciousness. How is this diagnosed? This condition is diagnosed based on your symptoms and a physical exam. Blood and urine tests may be done to help confirm the diagnosis. How is this treated? Treatment for this condition depends on the severity. Mild or moderate dehydration can often be treated at home. Treatment should be started right away. Do not wait until dehydration becomes severe. Severe dehydration is an emergency and it needs to be treated in a hospital. Treatment for mild dehydration may include:  Drinking more fluids.  Replacing salts and minerals in your blood (electrolytes) that you may have lost. Treatment for moderate dehydration may include:  Drinking an oral rehydration solution (ORS). This is a drink that helps you replace fluids and electrolytes (rehydrate). It can be found at pharmacies and retail stores. Treatment for severe dehydration may include:  Receiving fluids through an IV tube.  Receiving an electrolyte solution through a feeding tube that is passed through your nose   and into your stomach (nasogastric tube, or NG tube).  Correcting any abnormalities in electrolytes.  Treating the underlying cause of dehydration. Follow these instructions at home:  If directed by your health care provider, drink an ORS: ? Make an ORS by following instructions on the  package. ? Start by drinking small amounts, about  cup (120 mL) every 5-10 minutes. ? Slowly increase how much you drink until you have taken the amount recommended by your health care provider.  Drink enough clear fluid to keep your urine clear or pale yellow. If you were told to drink an ORS, finish the ORS first, then start slowly drinking other clear fluids. Drink fluids such as: ? Water. Do not drink only water. Doing that can lead to having too little salt (sodium) in the body (hyponatremia). ? Ice chips. ? Fruit juice that you have added water to (diluted fruit juice). ? Low-calorie sports drinks.  Avoid: ? Alcohol. ? Drinks that contain a lot of sugar. These include high-calorie sports drinks, fruit juice that is not diluted, and soda. ? Caffeine. ? Foods that are greasy or contain a lot of fat or sugar.  Take over-the-counter and prescription medicines only as told by your health care provider.  Do not take sodium tablets. This can lead to having too much sodium in the body (hypernatremia).  Eat foods that contain a healthy balance of electrolytes, such as bananas, oranges, potatoes, tomatoes, and spinach.  Keep all follow-up visits as told by your health care provider. This is important. Contact a health care provider if:  You have abdominal pain that: ? Gets worse. ? Stays in one area (localizes).  You have a rash.  You have a stiff neck.  You are more irritable than usual.  You are sleepier or more difficult to wake up than usual.  You feel weak or dizzy.  You feel very thirsty.  You have urinated only a small amount of very dark urine over 6-8 hours. Get help right away if:  You have symptoms of severe dehydration.  You cannot drink fluids without vomiting.  Your symptoms get worse with treatment.  You have a fever.  You have a severe headache.  You have vomiting or diarrhea that: ? Gets worse. ? Does not go away.  You have blood or green matter  (bile) in your vomit.  You have blood in your stool. This may cause stool to look black and tarry.  You have not urinated in 6-8 hours.  You faint.  Your heart rate while sitting still is over 100 beats a minute.  You have trouble breathing. This information is not intended to replace advice given to you by your health care provider. Make sure you discuss any questions you have with your health care provider. Document Released: 07/21/2005 Document Revised: 02/15/2016 Document Reviewed: 09/14/2015 Elsevier Interactive Patient Education  2018 [employer]  

## 2017-10-19 NOTE — Progress Notes (Signed)
DISCONTINUE ON PATHWAY REGIMEN - Small Cell Lung     A cycle is every 21 days:     Etoposide        Dose Mod: None     Carboplatin        Dose Mod: None  **Always confirm dose/schedule in your pharmacy ordering system**    REASON: Other Reason PRIOR TREATMENT: [id number]: Etoposide 100 mg/m2 Days 1, 2, 3 + Carboplatin AUC=5 Day 1 q21 Days x 4 Cycles TREATMENT RESPONSE: Partial Response (PR)  START ON PATHWAY REGIMEN - Small Cell Lung     Cycles 1 through 4, every 21 days:     Atezolizumab      Carboplatin      Etoposide    Cycles 5 and beyond, every 21 days:     Atezolizumab   **Always confirm dose/schedule in your pharmacy ordering system**    Patient Characteristics: Extensive Stage, First Line Stage Classification: Extensive AJCC T Category: T4 AJCC N Category: N2 AJCC M Category: M1c AJCC 8 Stage Grouping: IVB Line of therapy: First Line Would you be surprised if this patient died  in the next year<= I would NOT be surprised if this patient died in the next year Intent of Therapy: Non-Curative / Palliative Intent, Discussed with Patient

## 2017-10-19 NOTE — Progress Notes (Signed)
Jesse Avila [phone]   [phone]  OFFICE PROGRESS NOTE  [name], MD [address]  [place] [address]  DIAGNOSIS: Extensive stage (T4, N2, M1b) small cell lung cancer presented with large left upper lobe lung mass with mediastinal invasion as well as pancreatic lesion and suspicious left iliac bone metastasis diagnosed in February 2018.  PRIOR THERAPY: 1) Systemic chemotherapy with carboplatin for AUC of 5 on day 1 and etoposide 100 MG/M2 on days 1, 2 and 3 with Neulasta support status post 6 cycles.  Last dose of chemotherapy was given January 14, 2017 with partial response. This is concurrent with radiotherapy. This also concurrent with radiation to the large lung mass. 2) prophylactic cranial irradiation under the care of Dr. Sondra Come.  CURRENT THERAPY: Systemic chemotherapy with carboplatin for AUC of 5 on day 1, etoposide 100 mg/M2 on days 1, 2 and 3, Tecentriq (Atezolizumab) 1200 mg IV every 3 weeks with Neulasta support.  First dose October 28, 2017.   INTERVAL HISTORY: Jesse Avila 67 y.o. male returns to the clinic today for follow-up visit accompanied by his wife and daughter.  The patient continues to complain of increasing fatigue and weakness as well as lack of appetite, dehydration and weight loss.  He also complains of low back pain with radiation to the right iliac crest.  The patient was found on recent CT scan of the chest to have evidence for disease recurrence presenting with large pancreatic mass.  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  He denied having any current chest pain but has shortness of breath with exertion with no cough or hemoptysis.  He denied having any fever or chills.  He continues to have mild nausea with no vomiting and no diarrhea or constipation.  He has no fever or chills.   He denied having any headache or visual changes.  He is here today for evaluation and discussion of his treatment options for the recurrent small cell lung cancer.  MEDICAL HISTORY: Past Medical History:  Diagnosis Date  . Atrial fibrillation ([place])   . Carotid artery occlusion   . DJD (degenerative joint disease) of knee   . Essential hypertension, benign    takes Benicar daily  . Goals of care, counseling/discussion 09/18/2016  . History of colon polyps    benign  . History of gout   . History of radiation therapy 09/30/16-11/10/16   left lung 60 Gy in 30 fractions  . Hyperlipidemia    takes Fenofibrate and Crestor daily  . Insomnia    takes Ambien nightly as needed  . Joint pain   . Lung cancer ([place])   . Nocturia   . Peripheral vascular disease ([place])   . Primary localized osteoarthritis of left knee 12/26/2014  . Stroke [facility]) 2015?   takes Plavix daily as well as Pletal,not on plavix at present 05/15/15  . 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  MEDICATIONS:  Current Outpatient Medications  Medication Sig Dispense Refill  . amiodarone (PACERONE) 200 MG tablet Take 200 mg by mouth daily.    Marland Kitchen apixaban (ELIQUIS) 5 MG TABS tablet Take 1 tablet (5 mg total) by mouth 2 (two) times daily. 60 tablet 3  .  furosemide (LASIX) 40 MG tablet TAKE 1 TABLET BY MOUTH TWICE A DAY (Patient taking differently: TAKE 40 MG BY MOUTH TWICE A DAY) 60 tablet 6  . insulin glargine (LANTUS) 100 UNIT/ML injection Inject 0.1 mLs (10 Units total) into the skin daily. (Patient taking differently: Inject 15 Units into the skin at bedtime. ) 10 mL 11  . OVER THE COUNTER MEDICATION Take 1 capsule by mouth daily. Hemp Extract Supplement    . rosuvastatin (CRESTOR) 40 MG tablet Take 40 mg by mouth daily.  3  . spironolactone (ALDACTONE) 25 MG tablet  Take 1 tablet (25 mg total) by mouth daily. 30 tablet 0  . zolpidem (AMBIEN) 10 MG tablet Take 10 mg by mouth at bedtime as needed for sleep.      No current facility-administered medications for this visit.    Facility-Administered Medications Ordered in Other Visits  Medication Dose Route Frequency Provider Last Rate Last Dose  . heparin lock flush 100 unit/mL  500 Units Intracatheter Once PRN Curt Bears, MD      . sodium chloride flush (NS) 0.9 % injection 10 mL  10 mL Intracatheter PRN Curt Bears, MD   10 mL at 10/13/16 1618  . sodium chloride flush (NS) 0.9 % injection 10 mL  10 mL Intracatheter PRN Curt Bears, MD      . sodium chloride flush (NS) 0.9 % injection 10 mL  10 mL Intracatheter PRN Curt Bears, MD   10 mL at 11/26/16 1544    SURGICAL HISTORY:  Past Surgical History:  Procedure Laterality Date  . ABDOMINAL AORTAGRAM N/A 03/29/2012   Procedure: ABDOMINAL Maxcine Ham;  Surgeon: Angelia Mould, MD;  Location: [facility] CATH LAB;  Service: Cardiovascular;  Laterality: N/A;  . CARDIOVERSION N/A 05/08/2017   Procedure: CARDIOVERSION;  Surgeon: Minna Merritts, MD;  Location: [facility] ORS;  Service: Cardiovascular;  Laterality: N/A;  . COLONOSCOPY    . ENDARTERECTOMY Right 05/09/2014   Procedure: RIGHT CAROTID ENDARTERECTOMY WITH PATCH ANGIOPLASTY;  Surgeon: Conrad Millersburg, MD;  Location: [place];  Service: Vascular;  Laterality: Right;  . ENDARTERECTOMY FEMORAL Right 05/17/2015   Procedure: ENDARTERECTOMY FEMORAL;  Surgeon: Angelia Mould, MD;  Location: [place];  Service: Vascular;  Laterality: Right;  . ENDOBRONCHIAL ULTRASOUND Bilateral 09/08/2016   Procedure: ENDOBRONCHIAL ULTRASOUND;  Surgeon: Rigoberto Noel, MD;  Location: [facility] ENDOSCOPY;  Service: Cardiopulmonary;  Laterality: Bilateral;  . EUS N/A 10/08/2017   Procedure: UPPER ENDOSCOPIC ULTRASOUND (EUS) LINEAR;  Surgeon: [name], MD;  Location: [facility] ENDOSCOPY;  Service: Endoscopy;  Laterality: N/A;  .  FEMORAL-POPLITEAL BYPASS GRAFT Right 05/17/2015   Procedure: BYPASS GRAFT FEMORAL-BELOW KNEE POPLITEAL ARTERY, using gortex propaten graft 6 mm x 80 cm;  Surgeon: Angelia Mould, MD;  Location: [place];  Service: Vascular;  Laterality: Right;  . IR GENERIC HISTORICAL  09/25/2016   IR FLUORO GUIDE PORT INSERTION RIGHT 09/25/2016 Markus Daft, MD [facility]INTER[facility] RAD  . IR GENERIC HISTORICAL  09/25/2016   IR US GUIDE VASC ACCESS RIGHT 09/25/2016 Markus Daft, MD [facility]  . KNEE ARTHROSCOPY Right 11/2009  . LOWER EXTREMITY ANGIOGRAM Bilateral 03/23/2015   Procedure: Lower Extremity Angiogram;  Surgeon: Angelia Mould, MD;  Location: [place] CV LAB;  Service: Cardiovascular;  Laterality: Bilateral;  . MENISCUS REPAIR  11/2009  . PARTIAL KNEE ARTHROPLASTY Left 12/26/2014   Procedure: UNICOMPARTMENTAL KNEE;  Surgeon: Marchia Bond, MD;  Location: [place];  Service: Orthopedics;  Laterality: Left;  . PATCH ANGIOPLASTY Right 05/17/2015  Procedure: VEIN PATCH ANGIOPLASTY ILEOFEMORAL ARTERY;  Surgeon: Angelia Mould, MD;  Location: [place];  Service: Vascular;  Laterality: Right;  . PERCUTANEOUS STENT INTERVENTION N/A 05/10/2012   Procedure: PERCUTANEOUS STENT INTERVENTION;  Surgeon: Angelia Mould, MD;  Location: [facility] CATH LAB;  Service: Cardiovascular;  Laterality: N/A;  . PERIPHERAL VASCULAR CATHETERIZATION N/A 03/23/2015   Procedure: Abdominal Aortogram;  Surgeon: Angelia Mould, MD;  Location: [place] CV LAB;  Service: Cardiovascular;  Laterality: N/A;  . STENTS     PLACED IN ??BOTH LEGS   2013?  . TEE WITHOUT CARDIOVERSION N/A 03/06/2017   Procedure: TRANSESOPHAGEAL ECHOCARDIOGRAM (TEE);  Surgeon: Larey Dresser, MD;  Location: [facility] ENDOSCOPY;  Service: Cardiovascular;  Laterality: N/A;  . TEE WITHOUT CARDIOVERSION N/A 05/08/2017   Procedure: TRANSESOPHAGEAL ECHOCARDIOGRAM (TEE);  Surgeon: Minna Merritts, MD;  Location: [facility] ORS;  Service: Cardiovascular;  Laterality: N/A;     REVIEW OF SYSTEMS:  Constitutional: positive for anorexia, fatigue and weight loss Eyes: negative Ears, nose, mouth, throat, and face: negative Respiratory: positive for dyspnea on exertion Cardiovascular: negative Gastrointestinal: positive for nausea Genitourinary:negative Integument/breast: negative Hematologic/lymphatic: negative Musculoskeletal:positive for back pain, bone pain and muscle weakness Neurological: negative Behavioral/Psych: negative Endocrine: negative Allergic/Immunologic: negative   PHYSICAL EXAMINATION: General appearance: alert, cooperative, fatigued and no distress Head: Normocephalic, without obvious abnormality, atraumatic Neck: no adenopathy, no JVD, supple, symmetrical, trachea midline and thyroid not enlarged, symmetric, no tenderness/mass/nodules Lymph nodes: Cervical, supraclavicular, and axillary nodes normal. Resp: clear to auscultation bilaterally Back: symmetric, no curvature. ROM normal. No CVA tenderness. Cardio: regular rate and rhythm, S1, S2 normal, no murmur, click, rub or gallop GI: soft, non-tender; bowel sounds normal; no masses,  no organomegaly Extremities: extremities normal, atraumatic, no cyanosis or edema Neurologic: Alert and oriented X 3, normal strength and tone. Normal symmetric reflexes. Normal coordination and gait  ECOG PERFORMANCE STATUS: 1 - Symptomatic but completely ambulatory  Blood pressure 97/65, pulse 98, temperature 98.2 F (36.8 C), temperature source Oral, resp. rate 16, height 6\' 1"  (1.854 m), weight 165 lb 12.8 oz (75.2 kg), SpO2 95 %.  LABORATORY DATA: Lab Results  Component Value Date   WBC 9.4 10/19/2017   HGB 10.9 (L) 09/23/2017   HCT 31.7 (L) 10/19/2017   MCV 95.2 10/19/2017   PLT 163 10/19/2017      Chemistry      Component Value Date/Time   NA 138 09/23/2017 1036   NA 137 06/19/2017 0922   K 3.3 (L) 09/23/2017 1036   K 4.1 06/19/2017 0922   CL 94 (L) 09/23/2017 1036   CO2 31 (H)  09/23/2017 1036   CO2 28 06/19/2017 0922   BUN 18 09/23/2017 1036   BUN 12.6 06/19/2017 0922   CREATININE 1.78 (H) 09/23/2017 1036   CREATININE 1.3 06/19/2017 0922      Component Value Date/Time   CALCIUM 10.5 (H) 09/23/2017 1036   CALCIUM 9.6 06/19/2017 0922   ALKPHOS 103 09/23/2017 1036   ALKPHOS 116 06/19/2017 0922   AST 9 09/23/2017 1036   AST 15 06/19/2017 0922   ALT 6 09/23/2017 1036   ALT 14 06/19/2017 0922   BILITOT 0.8 09/23/2017 1036   BILITOT 0.56 06/19/2017 0922       RADIOGRAPHIC STUDIES: Ct Chest W Contrast  Result Date: 09/23/2017 CLINICAL DATA:  Small cell lung cancer on the left diagnosed in 2018. Chemotherapy completed. EXAM: CT CHEST, ABDOMEN, AND PELVIS WITH CONTRAST TECHNIQUE: Multidetector CT imaging of the chest, abdomen and pelvis  was performed following the standard protocol during bolus administration of intravenous contrast. CONTRAST:  56mL ISOVUE-300 IOPAMIDOL (ISOVUE-300) INJECTION 61% COMPARISON:  CT 06/19/2017. FINDINGS: CT CHEST FINDINGS Cardiovascular: Diffuse atherosclerosis of the aorta, great vessels and coronary arteries. There is stable intimal irregularity at the origin of the left common carotid artery. No evidence of large vessel occlusion or other acute vascular findings. Right IJ Port-A-Cath extends to the superior cavoatrial junction. The heart size is normal. There is no pericardial effusion. Mediastinum/Nodes: There are no enlarged mediastinal, hilar or axillary lymph nodes. The thyroid gland, trachea and esophagus demonstrate no significant findings. Lungs/Pleura: There are diffuse pleural calcifications bilaterally and a stable small left pleural effusion. There is stable volume loss in the left hemithorax with paramediastinal bronchiectasis and scarring attributed to previous radiation therapy. No recurrent mass lesion identified. There are no suspicious pulmonary nodules. The right lung is clear. Musculoskeletal/Chest wall: No chest wall mass  or suspicious osseous findings. CT ABDOMEN AND PELVIS FINDINGS Hepatobiliary: The liver is normal in density without focal abnormality. No evidence of gallstones, gallbladder wall thickening or biliary dilatation. Pancreas: There is a new mass inferior to the uncinate process of the pancreas and superior to the transverse duodenum. This measures 4.6 x 3.3 cm on image 72. There is no clear fat plane between this mass and the pancreas. However, there is no pancreatic or biliary ductal dilatation. The pancreas otherwise appears normal. There are no surrounding inflammatory changes. Spleen: Normal in size without focal abnormality. Adrenals/Urinary Tract: Both adrenal glands appear normal. The kidneys appear stable without suspicious findings. No evidence of urinary tract calculus or hydronephrosis. The bladder appears normal. Stomach/Bowel: No evidence of bowel wall thickening, distention or surrounding inflammatory change. The appendix appears normal. Mild diverticular changes are present in the distal colon. Vascular/Lymphatic: As above, there is a new mass between the uncinate process of the pancreas and the transverse duodenum which may reflect a nodal mass. No other enlarged abdominopelvic lymph nodes are seen. There is extensive aortic and branch vessel atherosclerosis. Probable chronically occluded graft in the right groin, unchanged from previous study. Evidence of underlying significant SFA narrowing bilaterally, similar to previous study. Reproductive: The prostate gland and seminal vesicles appear normal. Other: No evidence of abdominal wall mass or hernia. No ascites. Musculoskeletal: No acute or significant osseous findings. Ill-defined sclerosis in the left iliac bone (best seen on coronal image 105) is stable. IMPRESSION: 1. Evolving radiation changes in the left lung. No evidence of local recurrence or metastatic disease in the chest. 2. New retroperitoneal mass between the pancreatic head and  transverse duodenum of uncertain etiology. This could reflect a pseudocyst if the patient has had interval pancreatitis. However, primary pancreatic neoplasm and nodal metastasis from the patient's lung cancer are definite possibilities. Endoscopic ultrasound suggested for further evaluation and tissue sampling if there is no clinical suspicion of recent pancreatitis. 3. No other evidence of metastatic disease in the abdomen or pelvis. 4. Extensive atherosclerosis with areas of chronic luminal narrowing affecting the left common carotid artery and both proximal femoral arteries. Aortic Atherosclerosis (ICD10-I70.0). Electronically Signed   By: Richardean Sale M.D.   On: 09/23/2017 14:37   Ct Abdomen Pelvis W Contrast  Result Date: 09/23/2017 CLINICAL DATA:  Small cell lung cancer on the left diagnosed in 2018. Chemotherapy completed. EXAM: CT CHEST, ABDOMEN, AND PELVIS WITH CONTRAST TECHNIQUE: Multidetector CT imaging of the chest, abdomen and pelvis was performed following the standard protocol during bolus administration of intravenous contrast. CONTRAST:  60mL ISOVUE-300 IOPAMIDOL (ISOVUE-300) INJECTION 61% COMPARISON:  CT 06/19/2017. FINDINGS: CT CHEST FINDINGS Cardiovascular: Diffuse atherosclerosis of the aorta, great vessels and coronary arteries. There is stable intimal irregularity at the origin of the left common carotid artery. No evidence of large vessel occlusion or other acute vascular findings. Right IJ Port-A-Cath extends to the superior cavoatrial junction. The heart size is normal. There is no pericardial effusion. Mediastinum/Nodes: There are no enlarged mediastinal, hilar or axillary lymph nodes. The thyroid gland, trachea and esophagus demonstrate no significant findings. Lungs/Pleura: There are diffuse pleural calcifications bilaterally and a stable small left pleural effusion. There is stable volume loss in the left hemithorax with paramediastinal bronchiectasis and scarring attributed to  previous radiation therapy. No recurrent mass lesion identified. There are no suspicious pulmonary nodules. The right lung is clear. Musculoskeletal/Chest wall: No chest wall mass or suspicious osseous findings. CT ABDOMEN AND PELVIS FINDINGS Hepatobiliary: The liver is normal in density without focal abnormality. No evidence of gallstones, gallbladder wall thickening or biliary dilatation. Pancreas: There is a new mass inferior to the uncinate process of the pancreas and superior to the transverse duodenum. This measures 4.6 x 3.3 cm on image 72. There is no clear fat plane between this mass and the pancreas. However, there is no pancreatic or biliary ductal dilatation. The pancreas otherwise appears normal. There are no surrounding inflammatory changes. Spleen: Normal in size without focal abnormality. Adrenals/Urinary Tract: Both adrenal glands appear normal. The kidneys appear stable without suspicious findings. No evidence of urinary tract calculus or hydronephrosis. The bladder appears normal. Stomach/Bowel: No evidence of bowel wall thickening, distention or surrounding inflammatory change. The appendix appears normal. Mild diverticular changes are present in the distal colon. Vascular/Lymphatic: As above, there is a new mass between the uncinate process of the pancreas and the transverse duodenum which may reflect a nodal mass. No other enlarged abdominopelvic lymph nodes are seen. There is extensive aortic and branch vessel atherosclerosis. Probable chronically occluded graft in the right groin, unchanged from previous study. Evidence of underlying significant SFA narrowing bilaterally, similar to previous study. Reproductive: The prostate gland and seminal vesicles appear normal. Other: No evidence of abdominal wall mass or hernia. No ascites. Musculoskeletal: No acute or significant osseous findings. Ill-defined sclerosis in the left iliac bone (best seen on coronal image 105) is stable. IMPRESSION: 1.  Evolving radiation changes in the left lung. No evidence of local recurrence or metastatic disease in the chest. 2. New retroperitoneal mass between the pancreatic head and transverse duodenum of uncertain etiology. This could reflect a pseudocyst if the patient has had interval pancreatitis. However, primary pancreatic neoplasm and nodal metastasis from the patient's lung cancer are definite possibilities. Endoscopic ultrasound suggested for further evaluation and tissue sampling if there is no clinical suspicion of recent pancreatitis. 3. No other evidence of metastatic disease in the abdomen or pelvis. 4. Extensive atherosclerosis with areas of chronic luminal narrowing affecting the left common carotid artery and both proximal femoral arteries. Aortic Atherosclerosis (ICD10-I70.0). Electronically Signed   By: Richardean Sale M.D.   On: 09/23/2017 14:37    ASSESSMENT AND PLAN:  This is a very pleasant 67 years old white male with extensive stage small cell lung cancer completed 6 cycles of systemic chemotherapy with carboplatin and etoposide concurrent with radiation to the large left upper lobe mass.  The patient tolerated the treatment well except for the chemotherapy-induced pancytopenia and significant anemia. This was followed by prophylactic cranial irradiation. The patient was on observation  for almost 9 months. Repeat CT scan of the chest, abdomen and pelvis showed no concerning findings for disease recurrence in the chest but the patient has new retroperitoneal mass between the pancreatic head and transverse duodenum.   He underwent endoscopic ultrasound and biopsy of the pancreatic mass and the final pathology was consistent with recurrent small cell carcinoma. I had a lengthy discussion with the patient and his family about his current disease stage, prognosis and treatment options. I discussed with the patient the option of palliative care and hospice referral versus consideration of  palliative systemic chemotherapy with carboplatin for AUC of 5 on day 1, etoposide 100 mg/M2 on days 1, 2 and 3 as well as a Tecentriq (Atezolizumab) 1200 mg IV every 3 weeks with Neulasta support. I discussed with the patient the adverse effect of this treatment and he would like to proceed with systemic chemotherapy and expected to have the first dose next week. For the hypotension and acute renal insufficiency, I will arrange for the patient to receive IV hydration with normal saline daily for the next 5 days. For the hypokalemia, I will start the patient on IV potassium with the IV hydration on daily basis and I would also give him prescription for potassium chloride to take at home. For the weight loss, I will start the patient on Medrol Dosepak. For the low back pain, this is likely secondary to his disease.  I will give the patient prescription for Norco on as-needed basis. The patient will come back for follow-up visit in 4 weeks for evaluation with the start of cycle #2. He was advised to call immediately if he has any concerning symptoms in the interval. The patient voices understanding of current disease status and treatment options and is in agreement with the current care plan. All questions were answered. The patient knows to call the clinic with any problems, questions or concerns. We can certainly see the patient much sooner if necessary. I spent 25 minutes counseling the patient face to face. The total time spent in the appointment was 40 minutes.  Disclaimer: This note was dictated with voice recognition software. Similar sounding words can inadvertently be transcribed and may not be corrected upon review.

## 2017-10-20 ENCOUNTER — Other Ambulatory Visit: Payer: Self-pay | Admitting: Medical Oncology

## 2017-10-20 ENCOUNTER — Telehealth: Payer: Self-pay | Admitting: Medical Oncology

## 2017-10-20 ENCOUNTER — Telehealth: Payer: Self-pay | Admitting: Internal Medicine

## 2017-10-20 ENCOUNTER — Inpatient Hospital Stay: Payer: BLUE CROSS/BLUE SHIELD

## 2017-10-20 VITALS — BP 127/73 | HR 75 | Temp 98.1°F | Resp 16

## 2017-10-20 DIAGNOSIS — Z5112 Encounter for antineoplastic immunotherapy: Secondary | ICD-10-CM | POA: Diagnosis not present

## 2017-10-20 DIAGNOSIS — C3402 Malignant neoplasm of left main bronchus: Secondary | ICD-10-CM

## 2017-10-20 DIAGNOSIS — Z95828 Presence of other vascular implants and grafts: Secondary | ICD-10-CM

## 2017-10-20 MED ORDER — SODIUM CHLORIDE 0.9% FLUSH
10.0000 mL | INTRAVENOUS | Status: DC | PRN
  Administered 2017-10-20: 10 mL via INTRAVENOUS
  Filled 2017-10-20: qty 10

## 2017-10-20 MED ORDER — POTASSIUM CHLORIDE 2 MEQ/ML IV SOLN
Freq: Once | INTRAVENOUS | Status: AC
  Administered 2017-10-20: 15:00:00 via INTRAVENOUS
  Filled 2017-10-20: qty 1000

## 2017-10-20 MED ORDER — HEPARIN SOD (PORK) LOCK FLUSH 100 UNIT/ML IV SOLN
500.0000 [IU] | Freq: Once | INTRAVENOUS | Status: AC | PRN
  Administered 2017-10-20: 500 [IU] via INTRAVENOUS
  Filled 2017-10-20: qty 5

## 2017-10-20 NOTE — Telephone Encounter (Signed)
Diuretics-pt takes lasix 40 mg bid and Aldactone 25 mg/day. Should he continue these? I instructed pt , per [facility] , to continue these diuretics.

## 2017-10-20 NOTE — Telephone Encounter (Signed)
Scheduled appt per 3/18 los - patient coming in today - will get an updated schedule.

## 2017-10-20 NOTE — Patient Instructions (Signed)
Dehydration, Adult Dehydration is when there is not enough fluid or water in your body. This happens when you lose more fluids than you take in. Dehydration can range from mild to very bad. It should be treated right away to keep it from getting very bad. Symptoms of mild dehydration may include:  Thirst.  Dry lips.  Slightly dry mouth.  Dry, warm skin.  Dizziness. Symptoms of moderate dehydration may include:  Very dry mouth.  Muscle cramps.  Dark pee (urine). Pee may be the color of tea.  Your body making less pee.  Your eyes making fewer tears.  Heartbeat that is uneven or faster than normal (palpitations).  Headache.  Light-headedness, especially when you stand up from sitting.  Fainting (syncope). Symptoms of very bad dehydration may include:  Changes in skin, such as: ? Cold and clammy skin. ? Blotchy (mottled) or pale skin. ? Skin that does not quickly return to normal after being lightly pinched and let go (poor skin turgor).  Changes in body fluids, such as: ? Feeling very thirsty. ? Your eyes making fewer tears. ? Not sweating when body temperature is high, such as in hot weather. ? Your body making very little pee.  Changes in vital signs, such as: ? Weak pulse. ? Pulse that is more than 100 beats a minute when you are sitting still. ? Fast breathing. ? Low blood pressure.  Other changes, such as: ? Sunken eyes. ? Cold hands and feet. ? Confusion. ? Lack of energy (lethargy). ? Trouble waking up from sleep. ? Short-term weight loss. ? Unconsciousness. Follow these instructions at home:  If told by your doctor, drink an ORS: ? Make an ORS by using instructions on the package. ? Start by drinking small amounts, about  cup (120 mL) every 5-10 minutes. ? Slowly drink more until you have had the amount that your doctor said to have.  Drink enough clear fluid to keep your pee clear or pale yellow. If you were told to drink an ORS, finish the ORS  first, then start slowly drinking clear fluids. Drink fluids such as: ? Water. Do not drink only water by itself. Doing that can make the salt (sodium) level in your body get too low (hyponatremia). ? Ice chips. ? Fruit juice that you have added water to (diluted). ? Low-calorie sports drinks.  Avoid: ? Alcohol. ? Drinks that have a lot of sugar. These include high-calorie sports drinks, fruit juice that does not have water added, and soda. ? Caffeine. ? Foods that are greasy or have a lot of fat or sugar.  Take over-the-counter and prescription medicines only as told by your doctor.  Do not take salt tablets. Doing that can make the salt level in your body get too high (hypernatremia).  Eat foods that have minerals (electrolytes). Examples include bananas, oranges, potatoes, tomatoes, and spinach.  Keep all follow-up visits as told by your doctor. This is important. Contact a doctor if:  You have belly (abdominal) pain that: ? Gets worse. ? Stays in one area (localizes).  You have a rash.  You have a stiff neck.  You get angry or annoyed more easily than normal (irritability).  You are more sleepy than normal.  You have a harder time waking up than normal.  You feel: ? Weak. ? Dizzy. ? Very thirsty.  You have peed (urinated) only a small amount of very dark pee during 6-8 hours. Get help right away if:  You have symptoms of   very bad dehydration.  You cannot drink fluids without throwing up (vomiting).  Your symptoms get worse with treatment.  You have a fever.  You have a very bad headache.  You are throwing up or having watery poop (diarrhea) and it: ? Gets worse. ? Does not go away.  You have blood or something green (bile) in your throw-up.  You have blood in your poop (stool). This may cause poop to look black and tarry.  You have not peed in 6-8 hours.  You pass out (faint).  Your heart rate when you are sitting still is more than 100 beats a  minute.  You have trouble breathing. This information is not intended to replace advice given to you by your health care provider. Make sure you discuss any questions you have with your health care provider. Document Released: 05/17/2009 Document Revised: 02/08/2016 Document Reviewed: 09/14/2015 Elsevier Interactive Patient Education  2018 [employer]  

## 2017-10-21 ENCOUNTER — Inpatient Hospital Stay: Payer: BLUE CROSS/BLUE SHIELD

## 2017-10-21 VITALS — BP 118/63 | HR 73 | Temp 97.8°F | Resp 18

## 2017-10-21 DIAGNOSIS — C3402 Malignant neoplasm of left main bronchus: Secondary | ICD-10-CM

## 2017-10-21 DIAGNOSIS — Z5112 Encounter for antineoplastic immunotherapy: Secondary | ICD-10-CM | POA: Diagnosis not present

## 2017-10-21 DIAGNOSIS — Z95828 Presence of other vascular implants and grafts: Secondary | ICD-10-CM

## 2017-10-21 MED ORDER — SODIUM CHLORIDE 0.9% FLUSH
10.0000 mL | INTRAVENOUS | Status: DC | PRN
  Administered 2017-10-21: 10 mL via INTRAVENOUS
  Filled 2017-10-21: qty 10

## 2017-10-21 MED ORDER — SODIUM CHLORIDE 0.9 % IV SOLN
INTRAVENOUS | Status: DC
  Administered 2017-10-21: 13:00:00 via INTRAVENOUS
  Filled 2017-10-21 (×2): qty 1000

## 2017-10-21 MED ORDER — HEPARIN SOD (PORK) LOCK FLUSH 100 UNIT/ML IV SOLN
500.0000 [IU] | Freq: Once | INTRAVENOUS | Status: AC | PRN
  Administered 2017-10-21: 500 [IU] via INTRAVENOUS
  Filled 2017-10-21: qty 5

## 2017-10-21 NOTE — Patient Instructions (Signed)
Hypokalemia Hypokalemia means that the amount of potassium in the blood is lower than normal.Potassium is a chemical that helps regulate the amount of fluid in the body (electrolyte). It also stimulates muscle tightening (contraction) and helps nerves work properly.Normally, most of the body's potassium is inside of cells, and only a very small amount is in the blood. Because the amount in the blood is so small, minor changes to potassium levels in the blood can be life-threatening. What are the causes? This condition may be caused by:  Antibiotic medicine.  Diarrhea or vomiting. Taking too much of a medicine that helps you have a bowel movement (laxative) can cause diarrhea and lead to hypokalemia.  Chronic kidney disease (CKD).  Medicines that help the body get rid of excess fluid (diuretics).  Eating disorders, such as bulimia.  Low magnesium levels in the body.  Sweating a lot.  What are the signs or symptoms? Symptoms of this condition include:  Weakness.  Constipation.  Fatigue.  Muscle cramps.  Mental confusion.  Skipped heartbeats or irregular heartbeat (palpitations).  Tingling or numbness.  How is this diagnosed? This condition is diagnosed with a blood test. How is this treated? Hypokalemia can be treated by taking potassium supplements by mouth or adjusting the medicines that you take. Treatment may also include eating more foods that contain a lot of potassium. If your potassium level is very low, you may need to get potassium through an IV tube in one of your veins and be monitored in the hospital. Follow these instructions at home:  Take over-the-counter and prescription medicines only as told by your health care provider. This includes vitamins and supplements.  Eat a healthy diet. A healthy diet includes fresh fruits and vegetables, whole grains, healthy fats, and lean proteins.  If instructed, eat more foods that contain a lot of potassium, such  as: ? Nuts, such as peanuts and pistachios. ? Seeds, such as sunflower seeds and pumpkin seeds. ? Peas, lentils, and lima beans. ? Whole grain and bran cereals and breads. ? Fresh fruits and vegetables, such as apricots, avocado, bananas, cantaloupe, kiwi, oranges, tomatoes, asparagus, and potatoes. ? Orange juice. ? Tomato juice. ? Red meats. ? Yogurt.  Keep all follow-up visits as told by your health care provider. This is important. Contact a health care provider if:  You have weakness that gets worse.  You feel your heart pounding or racing.  You vomit.  You have diarrhea.  You have diabetes (diabetes mellitus) and you have trouble keeping your blood sugar (glucose) in your target range. Get help right away if:  You have chest pain.  You have shortness of breath.  You have vomiting or diarrhea that lasts for more than 2 days.  You faint. This information is not intended to replace advice given to you by your health care provider. Make sure you discuss any questions you have with your health care provider. Document Released: 07/21/2005 Document Revised: 03/08/2016 Document Reviewed: 03/08/2016 Elsevier Interactive Patient Education  2018 [place].   Dehydration, Adult Dehydration is when there is not enough fluid or water in your body. This happens when you lose more fluids than you take in. Dehydration can range from mild to very bad. It should be treated right away to keep it from getting very bad. Symptoms of mild dehydration may include:  Thirst.  Dry lips.  Slightly dry mouth.  Dry, warm skin.  Dizziness. Symptoms of moderate dehydration may include:  Very dry mouth.  Muscle  cramps.  Dark pee (urine). Pee may be the color of tea.  Your body making less pee.  Your eyes making fewer tears.  Heartbeat that is uneven or faster than normal (palpitations).  Headache.  Light-headedness, especially when you stand up from sitting.  Fainting  (syncope). Symptoms of very bad dehydration may include:  Changes in skin, such as: ? Cold and clammy skin. ? Blotchy (mottled) or pale skin. ? Skin that does not quickly return to normal after being lightly pinched and let go (poor skin turgor).  Changes in body fluids, such as: ? Feeling very thirsty. ? Your eyes making fewer tears. ? Not sweating when body temperature is high, such as in hot weather. ? Your body making very little pee.  Changes in vital signs, such as: ? Weak pulse. ? Pulse that is more than 100 beats a minute when you are sitting still. ? Fast breathing. ? Low blood pressure.  Other changes, such as: ? Sunken eyes. ? Cold hands and feet. ? Confusion. ? Lack of energy (lethargy). ? Trouble waking up from sleep. ? Short-term weight loss. ? Unconsciousness. Follow these instructions at home:  If told by your doctor, drink an ORS: ? Make an ORS by using instructions on the package. ? Start by drinking small amounts, about  cup (120 mL) every 5-10 minutes. ? Slowly drink more until you have had the amount that your doctor said to have.  Drink enough clear fluid to keep your pee clear or pale yellow. If you were told to drink an ORS, finish the ORS first, then start slowly drinking clear fluids. Drink fluids such as: ? Water. Do not drink only water by itself. Doing that can make the salt (sodium) level in your body get too low (hyponatremia). ? Ice chips. ? Fruit juice that you have added water to (diluted). ? Low-calorie sports drinks.  Avoid: ? Alcohol. ? Drinks that have a lot of sugar. These include high-calorie sports drinks, fruit juice that does not have water added, and soda. ? Caffeine. ? Foods that are greasy or have a lot of fat or sugar.  Take over-the-counter and prescription medicines only as told by your doctor.  Do not take salt tablets. Doing that can make the salt level in your body get too high (hypernatremia).  Eat foods that  have minerals (electrolytes). Examples include bananas, oranges, potatoes, tomatoes, and spinach.  Keep all follow-up visits as told by your doctor. This is important. Contact a doctor if:  You have belly (abdominal) pain that: ? Gets worse. ? Stays in one area (localizes).  You have a rash.  You have a stiff neck.  You get angry or annoyed more easily than normal (irritability).  You are more sleepy than normal.  You have a harder time waking up than normal.  You feel: ? Weak. ? Dizzy. ? Very thirsty.  You have peed (urinated) only a small amount of very dark pee during 6-8 hours. Get help right away if:  You have symptoms of very bad dehydration.  You cannot drink fluids without throwing up (vomiting).  Your symptoms get worse with treatment.  You have a fever.  You have a very bad headache.  You are throwing up or having watery poop (diarrhea) and it: ? Gets worse. ? Does not go away.  You have blood or something green (bile) in your throw-up.  You have blood in your poop (stool). This may cause poop to look black and tarry.  You have not peed in 6-8 hours.  You pass out (faint).  Your heart rate when you are sitting still is more than 100 beats a minute.  You have trouble breathing. This information is not intended to replace advice given to you by your health care provider. Make sure you discuss any questions you have with your health care provider. Document Released: 05/17/2009 Document Revised: 02/08/2016 Document Reviewed: 09/14/2015 Elsevier Interactive Patient Education  2018 [employer].

## 2017-10-22 ENCOUNTER — Inpatient Hospital Stay: Payer: BLUE CROSS/BLUE SHIELD

## 2017-10-22 VITALS — BP 122/75 | HR 64 | Temp 97.7°F | Resp 16

## 2017-10-22 DIAGNOSIS — C3402 Malignant neoplasm of left main bronchus: Secondary | ICD-10-CM

## 2017-10-22 DIAGNOSIS — Z5112 Encounter for antineoplastic immunotherapy: Secondary | ICD-10-CM | POA: Diagnosis not present

## 2017-10-22 DIAGNOSIS — Z95828 Presence of other vascular implants and grafts: Secondary | ICD-10-CM

## 2017-10-22 MED ORDER — HEPARIN SOD (PORK) LOCK FLUSH 100 UNIT/ML IV SOLN
500.0000 [IU] | Freq: Once | INTRAVENOUS | Status: AC | PRN
  Administered 2017-10-22: 500 [IU] via INTRAVENOUS
  Filled 2017-10-22: qty 5

## 2017-10-22 MED ORDER — SODIUM CHLORIDE 0.9 % IV SOLN
Freq: Once | INTRAVENOUS | Status: AC
  Administered 2017-10-22: 15:00:00 via INTRAVENOUS
  Filled 2017-10-22: qty 1000

## 2017-10-22 MED ORDER — SODIUM CHLORIDE 0.9% FLUSH
10.0000 mL | INTRAVENOUS | Status: DC | PRN
  Administered 2017-10-22: 10 mL via INTRAVENOUS
  Filled 2017-10-22: qty 10

## 2017-10-22 NOTE — Patient Instructions (Signed)
Hypokalemia Hypokalemia means that the amount of potassium in the blood is lower than normal.Potassium is a chemical that helps regulate the amount of fluid in the body (electrolyte). It also stimulates muscle tightening (contraction) and helps nerves work properly.Normally, most of the body's potassium is inside of cells, and only a very small amount is in the blood. Because the amount in the blood is so small, minor changes to potassium levels in the blood can be life-threatening. What are the causes? This condition may be caused by:  Antibiotic medicine.  Diarrhea or vomiting. Taking too much of a medicine that helps you have a bowel movement (laxative) can cause diarrhea and lead to hypokalemia.  Chronic kidney disease (CKD).  Medicines that help the body get rid of excess fluid (diuretics).  Eating disorders, such as bulimia.  Low magnesium levels in the body.  Sweating a lot.  What are the signs or symptoms? Symptoms of this condition include:  Weakness.  Constipation.  Fatigue.  Muscle cramps.  Mental confusion.  Skipped heartbeats or irregular heartbeat (palpitations).  Tingling or numbness.  How is this diagnosed? This condition is diagnosed with a blood test. How is this treated? Hypokalemia can be treated by taking potassium supplements by mouth or adjusting the medicines that you take. Treatment may also include eating more foods that contain a lot of potassium. If your potassium level is very low, you may need to get potassium through an IV tube in one of your veins and be monitored in the hospital. Follow these instructions at home:  Take over-the-counter and prescription medicines only as told by your health care provider. This includes vitamins and supplements.  Eat a healthy diet. A healthy diet includes fresh fruits and vegetables, whole grains, healthy fats, and lean proteins.  If instructed, eat more foods that contain a lot of potassium, such  as: ? Nuts, such as peanuts and pistachios. ? Seeds, such as sunflower seeds and pumpkin seeds. ? Peas, lentils, and lima beans. ? Whole grain and bran cereals and breads. ? Fresh fruits and vegetables, such as apricots, avocado, bananas, cantaloupe, kiwi, oranges, tomatoes, asparagus, and potatoes. ? Orange juice. ? Tomato juice. ? Red meats. ? Yogurt.  Keep all follow-up visits as told by your health care provider. This is important. Contact a health care provider if:  You have weakness that gets worse.  You feel your heart pounding or racing.  You vomit.  You have diarrhea.  You have diabetes (diabetes mellitus) and you have trouble keeping your blood sugar (glucose) in your target range. Get help right away if:  You have chest pain.  You have shortness of breath.  You have vomiting or diarrhea that lasts for more than 2 days.  You faint. This information is not intended to replace advice given to you by your health care provider. Make sure you discuss any questions you have with your health care provider. Document Released: 07/21/2005 Document Revised: 03/08/2016 Document Reviewed: 03/08/2016 Elsevier Interactive Patient Education  2018 [place].   Dehydration, Adult Dehydration is when there is not enough fluid or water in your body. This happens when you lose more fluids than you take in. Dehydration can range from mild to very bad. It should be treated right away to keep it from getting very bad. Symptoms of mild dehydration may include:  Thirst.  Dry lips.  Slightly dry mouth.  Dry, warm skin.  Dizziness. Symptoms of moderate dehydration may include:  Very dry mouth.  Muscle  cramps.  Dark pee (urine). Pee may be the color of tea.  Your body making less pee.  Your eyes making fewer tears.  Heartbeat that is uneven or faster than normal (palpitations).  Headache.  Light-headedness, especially when you stand up from sitting.  Fainting  (syncope). Symptoms of very bad dehydration may include:  Changes in skin, such as: ? Cold and clammy skin. ? Blotchy (mottled) or pale skin. ? Skin that does not quickly return to normal after being lightly pinched and let go (poor skin turgor).  Changes in body fluids, such as: ? Feeling very thirsty. ? Your eyes making fewer tears. ? Not sweating when body temperature is high, such as in hot weather. ? Your body making very little pee.  Changes in vital signs, such as: ? Weak pulse. ? Pulse that is more than 100 beats a minute when you are sitting still. ? Fast breathing. ? Low blood pressure.  Other changes, such as: ? Sunken eyes. ? Cold hands and feet. ? Confusion. ? Lack of energy (lethargy). ? Trouble waking up from sleep. ? Short-term weight loss. ? Unconsciousness. Follow these instructions at home:  If told by your doctor, drink an ORS: ? Make an ORS by using instructions on the package. ? Start by drinking small amounts, about  cup (120 mL) every 5-10 minutes. ? Slowly drink more until you have had the amount that your doctor said to have.  Drink enough clear fluid to keep your pee clear or pale yellow. If you were told to drink an ORS, finish the ORS first, then start slowly drinking clear fluids. Drink fluids such as: ? Water. Do not drink only water by itself. Doing that can make the salt (sodium) level in your body get too low (hyponatremia). ? Ice chips. ? Fruit juice that you have added water to (diluted). ? Low-calorie sports drinks.  Avoid: ? Alcohol. ? Drinks that have a lot of sugar. These include high-calorie sports drinks, fruit juice that does not have water added, and soda. ? Caffeine. ? Foods that are greasy or have a lot of fat or sugar.  Take over-the-counter and prescription medicines only as told by your doctor.  Do not take salt tablets. Doing that can make the salt level in your body get too high (hypernatremia).  Eat foods that  have minerals (electrolytes). Examples include bananas, oranges, potatoes, tomatoes, and spinach.  Keep all follow-up visits as told by your doctor. This is important. Contact a doctor if:  You have belly (abdominal) pain that: ? Gets worse. ? Stays in one area (localizes).  You have a rash.  You have a stiff neck.  You get angry or annoyed more easily than normal (irritability).  You are more sleepy than normal.  You have a harder time waking up than normal.  You feel: ? Weak. ? Dizzy. ? Very thirsty.  You have peed (urinated) only a small amount of very dark pee during 6-8 hours. Get help right away if:  You have symptoms of very bad dehydration.  You cannot drink fluids without throwing up (vomiting).  Your symptoms get worse with treatment.  You have a fever.  You have a very bad headache.  You are throwing up or having watery poop (diarrhea) and it: ? Gets worse. ? Does not go away.  You have blood or something green (bile) in your throw-up.  You have blood in your poop (stool). This may cause poop to look black and tarry.  You have not peed in 6-8 hours.  You pass out (faint).  Your heart rate when you are sitting still is more than 100 beats a minute.  You have trouble breathing. This information is not intended to replace advice given to you by your health care provider. Make sure you discuss any questions you have with your health care provider. Document Released: 05/17/2009 Document Revised: 02/08/2016 Document Reviewed: 09/14/2015 Elsevier Interactive Patient Education  2018 [employer].

## 2017-10-23 ENCOUNTER — Inpatient Hospital Stay: Payer: BLUE CROSS/BLUE SHIELD

## 2017-10-23 VITALS — BP 104/67 | HR 88 | Temp 98.8°F | Resp 17

## 2017-10-23 DIAGNOSIS — Z5112 Encounter for antineoplastic immunotherapy: Secondary | ICD-10-CM | POA: Diagnosis not present

## 2017-10-23 DIAGNOSIS — Z95828 Presence of other vascular implants and grafts: Secondary | ICD-10-CM

## 2017-10-23 DIAGNOSIS — C3402 Malignant neoplasm of left main bronchus: Secondary | ICD-10-CM

## 2017-10-23 MED ORDER — SODIUM CHLORIDE 0.9 % IV SOLN
Freq: Once | INTRAVENOUS | Status: AC
  Administered 2017-10-23: 15:00:00 via INTRAVENOUS
  Filled 2017-10-23: qty 1000

## 2017-10-23 MED ORDER — SODIUM CHLORIDE 0.9% FLUSH
10.0000 mL | INTRAVENOUS | Status: DC | PRN
  Administered 2017-10-23: 10 mL via INTRAVENOUS
  Filled 2017-10-23: qty 10

## 2017-10-23 MED ORDER — HEPARIN SOD (PORK) LOCK FLUSH 100 UNIT/ML IV SOLN
500.0000 [IU] | Freq: Once | INTRAVENOUS | Status: AC | PRN
  Administered 2017-10-23: 500 [IU] via INTRAVENOUS
  Filled 2017-10-23: qty 5

## 2017-10-23 NOTE — Patient Instructions (Signed)
Dehydration, Adult Dehydration is when there is not enough fluid or water in your body. This happens when you lose more fluids than you take in. Dehydration can range from mild to very bad. It should be treated right away to keep it from getting very bad. Symptoms of mild dehydration may include:  Thirst.  Dry lips.  Slightly dry mouth.  Dry, warm skin.  Dizziness. Symptoms of moderate dehydration may include:  Very dry mouth.  Muscle cramps.  Dark pee (urine). Pee may be the color of tea.  Your body making less pee.  Your eyes making fewer tears.  Heartbeat that is uneven or faster than normal (palpitations).  Headache.  Light-headedness, especially when you stand up from sitting.  Fainting (syncope). Symptoms of very bad dehydration may include:  Changes in skin, such as: ? Cold and clammy skin. ? Blotchy (mottled) or pale skin. ? Skin that does not quickly return to normal after being lightly pinched and let go (poor skin turgor).  Changes in body fluids, such as: ? Feeling very thirsty. ? Your eyes making fewer tears. ? Not sweating when body temperature is high, such as in hot weather. ? Your body making very little pee.  Changes in vital signs, such as: ? Weak pulse. ? Pulse that is more than 100 beats a minute when you are sitting still. ? Fast breathing. ? Low blood pressure.  Other changes, such as: ? Sunken eyes. ? Cold hands and feet. ? Confusion. ? Lack of energy (lethargy). ? Trouble waking up from sleep. ? Short-term weight loss. ? Unconsciousness. Follow these instructions at home:  If told by your doctor, drink an ORS: ? Make an ORS by using instructions on the package. ? Start by drinking small amounts, about  cup (120 mL) every 5-10 minutes. ? Slowly drink more until you have had the amount that your doctor said to have.  Drink enough clear fluid to keep your pee clear or pale yellow. If you were told to drink an ORS, finish the ORS  first, then start slowly drinking clear fluids. Drink fluids such as: ? Water. Do not drink only water by itself. Doing that can make the salt (sodium) level in your body get too low (hyponatremia). ? Ice chips. ? Fruit juice that you have added water to (diluted). ? Low-calorie sports drinks.  Avoid: ? Alcohol. ? Drinks that have a lot of sugar. These include high-calorie sports drinks, fruit juice that does not have water added, and soda. ? Caffeine. ? Foods that are greasy or have a lot of fat or sugar.  Take over-the-counter and prescription medicines only as told by your doctor.  Do not take salt tablets. Doing that can make the salt level in your body get too high (hypernatremia).  Eat foods that have minerals (electrolytes). Examples include bananas, oranges, potatoes, tomatoes, and spinach.  Keep all follow-up visits as told by your doctor. This is important. Contact a doctor if:  You have belly (abdominal) pain that: ? Gets worse. ? Stays in one area (localizes).  You have a rash.  You have a stiff neck.  You get angry or annoyed more easily than normal (irritability).  You are more sleepy than normal.  You have a harder time waking up than normal.  You feel: ? Weak. ? Dizzy. ? Very thirsty.  You have peed (urinated) only a small amount of very dark pee during 6-8 hours. Get help right away if:  You have symptoms of   very bad dehydration.  You cannot drink fluids without throwing up (vomiting).  Your symptoms get worse with treatment.  You have a fever.  You have a very bad headache.  You are throwing up or having watery poop (diarrhea) and it: ? Gets worse. ? Does not go away.  You have blood or something green (bile) in your throw-up.  You have blood in your poop (stool). This may cause poop to look black and tarry.  You have not peed in 6-8 hours.  You pass out (faint).  Your heart rate when you are sitting still is more than 100 beats a  minute.  You have trouble breathing. This information is not intended to replace advice given to you by your health care provider. Make sure you discuss any questions you have with your health care provider. Document Released: 05/17/2009 Document Revised: 02/08/2016 Document Reviewed: 09/14/2015 Elsevier Interactive Patient Education  2018 [employer]  

## 2017-10-28 ENCOUNTER — Other Ambulatory Visit: Payer: Self-pay | Admitting: *Deleted

## 2017-10-28 ENCOUNTER — Inpatient Hospital Stay: Payer: BLUE CROSS/BLUE SHIELD

## 2017-10-28 VITALS — BP 143/70 | HR 79 | Temp 98.4°F | Resp 18

## 2017-10-28 DIAGNOSIS — C3492 Malignant neoplasm of unspecified part of left bronchus or lung: Secondary | ICD-10-CM

## 2017-10-28 DIAGNOSIS — E876 Hypokalemia: Secondary | ICD-10-CM

## 2017-10-28 DIAGNOSIS — Z713 Dietary counseling and surveillance: Secondary | ICD-10-CM

## 2017-10-28 DIAGNOSIS — Z5112 Encounter for antineoplastic immunotherapy: Secondary | ICD-10-CM | POA: Diagnosis not present

## 2017-10-28 LAB — CBC WITH DIFFERENTIAL (CANCER CENTER ONLY)
BASOS ABS: 0 10*3/uL (ref 0.0–0.1)
BASOS PCT: 0 %
EOS ABS: 0 10*3/uL (ref 0.0–0.5)
Eosinophils Relative: 0 %
HCT: 31.4 % — ABNORMAL LOW (ref 38.4–49.9)
HEMOGLOBIN: 10.2 g/dL — AB (ref 13.0–17.1)
Lymphocytes Relative: 13 %
Lymphs Abs: 1 10*3/uL (ref 0.9–3.3)
MCH: 32.4 pg (ref 27.2–33.4)
MCHC: 32.5 g/dL (ref 32.0–36.0)
MCV: 99.7 fL — ABNORMAL HIGH (ref 79.3–98.0)
Monocytes Absolute: 0.5 10*3/uL (ref 0.1–0.9)
Monocytes Relative: 6 %
NEUTROS ABS: 6.4 10*3/uL (ref 1.5–6.5)
NEUTROS PCT: 81 %
Platelet Count: 115 10*3/uL — ABNORMAL LOW (ref 140–400)
RBC: 3.15 MIL/uL — AB (ref 4.20–5.82)
RDW: 16.2 % — ABNORMAL HIGH (ref 11.0–14.6)
WBC: 7.9 10*3/uL (ref 4.0–10.3)

## 2017-10-28 LAB — CMP (CANCER CENTER ONLY)
ALBUMIN: 3.3 g/dL — AB (ref 3.5–5.0)
ALT: 13 U/L (ref 0–55)
ANION GAP: 9 (ref 3–11)
AST: 10 U/L (ref 5–34)
Alkaline Phosphatase: 109 U/L (ref 40–150)
BUN: 36 mg/dL — AB (ref 7–26)
CO2: 27 mmol/L (ref 22–29)
Calcium: 9.9 mg/dL (ref 8.4–10.4)
Chloride: 98 mmol/L (ref 98–109)
Creatinine: 2.01 mg/dL — ABNORMAL HIGH (ref 0.70–1.30)
GFR, Est AFR Am: 39 mL/min — ABNORMAL LOW (ref >60)
GFR, Estimated: 33 mL/min — ABNORMAL LOW (ref >60)
GLUCOSE: 289 mg/dL — AB (ref 70–140)
Potassium: 4.4 mmol/L (ref 3.5–5.1)
SODIUM: 134 mmol/L — AB (ref 136–145)
Total Bilirubin: 0.4 mg/dL (ref 0.2–1.2)
Total Protein: 6.5 g/dL (ref 6.4–8.3)

## 2017-10-28 LAB — TSH: TSH: 0.368 u[IU]/mL (ref 0.320–4.118)

## 2017-10-28 MED ORDER — DEXAMETHASONE SODIUM PHOSPHATE 10 MG/ML IJ SOLN
10.0000 mg | Freq: Once | INTRAMUSCULAR | Status: AC
  Administered 2017-10-28: 10 mg via INTRAVENOUS

## 2017-10-28 MED ORDER — SODIUM CHLORIDE 0.9 % IV SOLN
Freq: Once | INTRAVENOUS | Status: AC
  Administered 2017-10-28: 09:00:00 via INTRAVENOUS

## 2017-10-28 MED ORDER — PALONOSETRON HCL INJECTION 0.25 MG/5ML
0.2500 mg | Freq: Once | INTRAVENOUS | Status: AC
  Administered 2017-10-28: 0.25 mg via INTRAVENOUS

## 2017-10-28 MED ORDER — DIPHENHYDRAMINE HCL 50 MG/ML IJ SOLN
50.0000 mg | Freq: Once | INTRAMUSCULAR | Status: AC
  Administered 2017-10-28: 50 mg via INTRAVENOUS

## 2017-10-28 MED ORDER — HYDROCODONE-ACETAMINOPHEN 5-325 MG PO TABS: 1.0000 | ORAL_TABLET | Freq: Four times a day (QID) | ORAL | 0 refills | Status: DC | PRN

## 2017-10-28 MED ORDER — SODIUM CHLORIDE 0.9 % IV SOLN: Freq: Once | INTRAVENOUS | Status: DC

## 2017-10-28 MED ORDER — FAMOTIDINE IN NACL 20-0.9 MG/50ML-% IV SOLN
INTRAVENOUS | Status: AC
  Filled 2017-10-28: qty 50

## 2017-10-28 MED ORDER — FAMOTIDINE IN NACL 20-0.9 MG/50ML-% IV SOLN
20.0000 mg | Freq: Once | INTRAVENOUS | Status: AC
  Administered 2017-10-28: 20 mg via INTRAVENOUS

## 2017-10-28 MED ORDER — SODIUM CHLORIDE 0.9 % IV SOLN
1200.0000 mg | Freq: Once | INTRAVENOUS | Status: AC
  Administered 2017-10-28: 1200 mg via INTRAVENOUS
  Filled 2017-10-28: qty 20

## 2017-10-28 MED ORDER — DIPHENHYDRAMINE HCL 50 MG/ML IJ SOLN
INTRAMUSCULAR | Status: AC
  Filled 2017-10-28: qty 1

## 2017-10-28 MED ORDER — SODIUM CHLORIDE 0.9% FLUSH
10.0000 mL | INTRAVENOUS | Status: DC | PRN
  Administered 2017-10-28: 10 mL
  Filled 2017-10-28: qty 10

## 2017-10-28 MED ORDER — SODIUM CHLORIDE 0.9 % IV SOLN
320.0000 mg | Freq: Once | INTRAVENOUS | Status: AC
  Administered 2017-10-28: 320 mg via INTRAVENOUS
  Filled 2017-10-28: qty 32

## 2017-10-28 MED ORDER — DEXAMETHASONE SODIUM PHOSPHATE 10 MG/ML IJ SOLN
INTRAMUSCULAR | Status: AC
  Filled 2017-10-28: qty 1

## 2017-10-28 MED ORDER — PROCHLORPERAZINE MALEATE 10 MG PO TABS: 10.0000 mg | ORAL_TABLET | Freq: Four times a day (QID) | ORAL | 0 refills | Status: DC | PRN

## 2017-10-28 MED ORDER — HEPARIN SOD (PORK) LOCK FLUSH 100 UNIT/ML IV SOLN
500.0000 [IU] | Freq: Once | INTRAVENOUS | Status: AC | PRN
  Administered 2017-10-28: 500 [IU]
  Filled 2017-10-28: qty 5

## 2017-10-28 MED ORDER — SODIUM CHLORIDE 0.9 % IV SOLN
100.0000 mg/m2 | Freq: Once | INTRAVENOUS | Status: AC
  Administered 2017-10-28: 200 mg via INTRAVENOUS
  Filled 2017-10-28: qty 10

## 2017-10-28 MED ORDER — PALONOSETRON HCL INJECTION 0.25 MG/5ML
INTRAVENOUS | Status: AC
  Filled 2017-10-28: qty 5

## 2017-10-28 NOTE — Progress Notes (Signed)
10/28/17 - 9:39 am  Clarification of Carboplatin dose with todays serum creatinine level of 2.01 and AUC 5 would be 320mg .  Received ok to proceed with dose of 320 mg for todays dose.  T.O. Dr Mohamed/CJones  Henreitta Leber, PharmD

## 2017-10-28 NOTE — Patient Instructions (Addendum)
[facility] Discharge Instructions for Patients Receiving Chemotherapy  Today you received the following chemotherapy agents etoposide and carboplatin and atezolizumab  To help prevent nausea and vomiting after your treatment, we encourage you to take your nausea medication as directed   If you develop nausea and vomiting that is not controlled by your nausea medication, call the clinic.   BELOW ARE SYMPTOMS THAT SHOULD BE REPORTED IMMEDIATELY:  *FEVER GREATER THAN 100.5 F  *CHILLS WITH OR WITHOUT FEVER  NAUSEA AND VOMITING THAT IS NOT CONTROLLED WITH YOUR NAUSEA MEDICATION  *UNUSUAL SHORTNESS OF BREATH  *UNUSUAL BRUISING OR BLEEDING  TENDERNESS IN MOUTH AND THROAT WITH OR WITHOUT PRESENCE OF ULCERS  *URINARY PROBLEMS  *BOWEL PROBLEMS  UNUSUAL RASH Items with * indicate a potential emergency and should be followed up as soon as possible.  Feel free to call the clinic should you have any questions or concerns. The clinic phone number is [phone].  Please show the [facility] at check-in to the Emergency Department and triage nurse.   Atezolizumab Gildardo Pounds) injection What is this medicine? ATEZOLIZUMAB (a te zoe LIZ ue mab) is a monoclonal antibody. It is used to treat bladder cancer (urothelial cancer) and non-small cell lung cancer. This medicine may be used for other purposes; ask your health care provider or pharmacist if you have questions. COMMON BRAND NAME(S): Tecentriq What should I tell my health care provider before I take this medicine? They need to know if you have any of these conditions: -diabetes -immune system problems -infection -inflammatory bowel disease -liver disease -lung or breathing disease -lupus -nervous system problems like myasthenia gravis or Guillain-Barre syndrome -organ transplant -an unusual or allergic reaction to atezolizumab, other medicines, foods, dyes, or preservatives -pregnant or trying to get  pregnant -breast-feeding How should I use this medicine? This medicine is for infusion into a vein. It is given by a health care professional in a hospital or clinic setting. A special MedGuide will be given to you before each treatment. Be sure to read this information carefully each time. Talk to your pediatrician regarding the use of this medicine in children. Special care may be needed. Overdosage: If you think you have taken too much of this medicine contact a poison control center or emergency room at once. NOTE: This medicine is only for you. Do not share this medicine with others. What if I miss a dose? It is important not to miss your dose. Call your doctor or health care professional if you are unable to keep an appointment. What may interact with this medicine? Interactions have not been studied. This list may not describe all possible interactions. Give your health care provider a list of all the medicines, herbs, non-prescription drugs, or dietary supplements you use. Also tell them if you smoke, drink alcohol, or use illegal drugs. Some items may interact with your medicine. What should I watch for while using this medicine? Your condition will be monitored carefully while you are receiving this medicine. You may need blood work done while you are taking this medicine. Do not become pregnant while taking this medicine or for at least 5 months after stopping it. Women should inform their doctor if they wish to become pregnant or think they might be pregnant. There is a potential for serious side effects to an unborn child. Talk to your health care professional or pharmacist for more information. Do not breast-feed an infant while taking this medicine or for at least 5 months  after the last dose. What side effects may I notice from receiving this medicine? Side effects that you should report to your doctor or health care professional as soon as possible: -allergic reactions like skin  rash, itching or hives, swelling of the face, lips, or tongue -black, tarry stools -bloody or watery diarrhea -breathing problems -changes in vision -chest pain or chest tightness -chills -facial flushing -fever -headache -signs and symptoms of high blood sugar such as dizziness; dry mouth; dry skin; fruity breath; nausea; stomach pain; increased hunger or thirst; increased urination -signs and symptoms of liver injury like dark yellow or brown urine; general ill feeling or flu-like symptoms; light-colored stools; loss of appetite; nausea; right upper belly pain; unusually weak or tired; yellowing of the eyes or skin -stomach pain -trouble passing urine or change in the amount of urine Side effects that usually do not require medical attention (report to your doctor or health care professional if they continue or are bothersome): -cough -diarrhea -joint pain -muscle pain -muscle weakness -tiredness -weight loss This list may not describe all possible side effects. Call your doctor for medical advice about side effects. You may report side effects to FDA at 1-800-FDA-1088. Where should I keep my medicine? This drug is given in a hospital or clinic and will not be stored at home. NOTE: This sheet is a summary. It may not cover all possible information. If you have questions about this medicine, talk to your doctor, pharmacist, or health care provider.  2018 Elsevier/Gold Standard (2015-08-22 17:54:14)

## 2017-10-28 NOTE — Progress Notes (Signed)
CBC and Cmet reviewed with MD,Ok to treat despite labs

## 2017-10-28 NOTE — Progress Notes (Signed)
Per Dr. Julien Nordmann okay to treat pt with crt of 2.01

## 2017-10-29 ENCOUNTER — Inpatient Hospital Stay: Payer: BLUE CROSS/BLUE SHIELD

## 2017-10-29 VITALS — BP 126/92 | HR 90 | Temp 98.1°F | Resp 18

## 2017-10-29 DIAGNOSIS — Z5112 Encounter for antineoplastic immunotherapy: Secondary | ICD-10-CM | POA: Diagnosis not present

## 2017-10-29 DIAGNOSIS — C3492 Malignant neoplasm of unspecified part of left bronchus or lung: Secondary | ICD-10-CM

## 2017-10-29 MED ORDER — SODIUM CHLORIDE 0.9% FLUSH
10.0000 mL | INTRAVENOUS | Status: DC | PRN
  Administered 2017-10-29: 10 mL
  Filled 2017-10-29: qty 10

## 2017-10-29 MED ORDER — DEXAMETHASONE SODIUM PHOSPHATE 10 MG/ML IJ SOLN
INTRAMUSCULAR | Status: AC
  Filled 2017-10-29: qty 1

## 2017-10-29 MED ORDER — SODIUM CHLORIDE 0.9 % IV SOLN
100.0000 mg/m2 | Freq: Once | INTRAVENOUS | Status: AC
  Administered 2017-10-29: 200 mg via INTRAVENOUS
  Filled 2017-10-29: qty 10

## 2017-10-29 MED ORDER — DEXAMETHASONE SODIUM PHOSPHATE 10 MG/ML IJ SOLN
10.0000 mg | Freq: Once | INTRAMUSCULAR | Status: AC
  Administered 2017-10-29: 10 mg via INTRAVENOUS

## 2017-10-29 MED ORDER — SODIUM CHLORIDE 0.9 % IV SOLN
Freq: Once | INTRAVENOUS | Status: AC
  Administered 2017-10-29: 14:00:00 via INTRAVENOUS

## 2017-10-29 MED ORDER — HEPARIN SOD (PORK) LOCK FLUSH 100 UNIT/ML IV SOLN
500.0000 [IU] | Freq: Once | INTRAVENOUS | Status: AC | PRN
  Administered 2017-10-29: 500 [IU]
  Filled 2017-10-29: qty 5

## 2017-10-29 NOTE — Patient Instructions (Signed)
[facility] Discharge Instructions for Patients Receiving Chemotherapy  Today you received the following chemotherapy agents: etoposide  To help prevent nausea and vomiting after your treatment, we encourage you to take your nausea medication as directed.   If you develop nausea and vomiting that is not controlled by your nausea medication, call the clinic.   BELOW ARE SYMPTOMS THAT SHOULD BE REPORTED IMMEDIATELY:  *FEVER GREATER THAN 100.5 F  *CHILLS WITH OR WITHOUT FEVER  NAUSEA AND VOMITING THAT IS NOT CONTROLLED WITH YOUR NAUSEA MEDICATION  *UNUSUAL SHORTNESS OF BREATH  *UNUSUAL BRUISING OR BLEEDING  TENDERNESS IN MOUTH AND THROAT WITH OR WITHOUT PRESENCE OF ULCERS  *URINARY PROBLEMS  *BOWEL PROBLEMS  UNUSUAL RASH Items with * indicate a potential emergency and should be followed up as soon as possible.  Feel free to call the clinic should you have any questions or concerns. The clinic phone number is [phone].  Please show the CHEMO ALERT CARD at check-in to the Emergency Department and triage nurse.   

## 2017-10-30 ENCOUNTER — Inpatient Hospital Stay: Payer: BLUE CROSS/BLUE SHIELD

## 2017-10-30 VITALS — BP 116/65 | HR 96 | Temp 97.6°F | Resp 16

## 2017-10-30 DIAGNOSIS — Z5112 Encounter for antineoplastic immunotherapy: Secondary | ICD-10-CM | POA: Diagnosis not present

## 2017-10-30 DIAGNOSIS — C3492 Malignant neoplasm of unspecified part of left bronchus or lung: Secondary | ICD-10-CM

## 2017-10-30 MED ORDER — HEPARIN SOD (PORK) LOCK FLUSH 100 UNIT/ML IV SOLN
500.0000 [IU] | Freq: Once | INTRAVENOUS | Status: AC | PRN
  Administered 2017-10-30: 500 [IU]
  Filled 2017-10-30: qty 5

## 2017-10-30 MED ORDER — SODIUM CHLORIDE 0.9 % IV SOLN
Freq: Once | INTRAVENOUS | Status: AC
  Administered 2017-10-30: 13:00:00 via INTRAVENOUS

## 2017-10-30 MED ORDER — SODIUM CHLORIDE 0.9 % IV SOLN
100.0000 mg/m2 | Freq: Once | INTRAVENOUS | Status: AC
  Administered 2017-10-30: 200 mg via INTRAVENOUS
  Filled 2017-10-30: qty 10

## 2017-10-30 MED ORDER — SODIUM CHLORIDE 0.9% FLUSH
10.0000 mL | INTRAVENOUS | Status: DC | PRN
  Administered 2017-10-30: 10 mL
  Filled 2017-10-30: qty 10

## 2017-10-30 MED ORDER — DEXAMETHASONE SODIUM PHOSPHATE 10 MG/ML IJ SOLN
10.0000 mg | Freq: Once | INTRAMUSCULAR | Status: AC
  Administered 2017-10-30: 10 mg via INTRAVENOUS

## 2017-10-30 MED ORDER — DEXAMETHASONE SODIUM PHOSPHATE 10 MG/ML IJ SOLN
INTRAMUSCULAR | Status: AC
  Filled 2017-10-30: qty 1

## 2017-10-30 NOTE — Patient Instructions (Signed)
[facility] Discharge Instructions for Patients Receiving Chemotherapy  Today you received the following chemotherapy agents etoposide   To help prevent nausea and vomiting after your treatment, we encourage you to take your nausea medication as directed.   If you develop nausea and vomiting that is not controlled by your nausea medication, call the clinic.   BELOW ARE SYMPTOMS THAT SHOULD BE REPORTED IMMEDIATELY:  *FEVER GREATER THAN 100.5 F  *CHILLS WITH OR WITHOUT FEVER  NAUSEA AND VOMITING THAT IS NOT CONTROLLED WITH YOUR NAUSEA MEDICATION  *UNUSUAL SHORTNESS OF BREATH  *UNUSUAL BRUISING OR BLEEDING  TENDERNESS IN MOUTH AND THROAT WITH OR WITHOUT PRESENCE OF ULCERS  *URINARY PROBLEMS  *BOWEL PROBLEMS  UNUSUAL RASH Items with * indicate a potential emergency and should be followed up as soon as possible.  Feel free to call the clinic you have any questions or concerns. The clinic phone number is [phone].

## 2017-10-31 ENCOUNTER — Ambulatory Visit: Payer: Medicare Other

## 2017-11-02 ENCOUNTER — Inpatient Hospital Stay: Payer: BLUE CROSS/BLUE SHIELD | Attending: Internal Medicine

## 2017-11-02 ENCOUNTER — Telehealth: Payer: Self-pay | Admitting: *Deleted

## 2017-11-02 VITALS — BP 110/65 | HR 94 | Temp 97.8°F | Resp 18

## 2017-11-02 DIAGNOSIS — D696 Thrombocytopenia, unspecified: Secondary | ICD-10-CM | POA: Diagnosis not present

## 2017-11-02 DIAGNOSIS — Z5112 Encounter for antineoplastic immunotherapy: Secondary | ICD-10-CM | POA: Diagnosis not present

## 2017-11-02 DIAGNOSIS — C3412 Malignant neoplasm of upper lobe, left bronchus or lung: Secondary | ICD-10-CM | POA: Diagnosis present

## 2017-11-02 DIAGNOSIS — E860 Dehydration: Secondary | ICD-10-CM | POA: Insufficient documentation

## 2017-11-02 DIAGNOSIS — D701 Agranulocytosis secondary to cancer chemotherapy: Secondary | ICD-10-CM | POA: Insufficient documentation

## 2017-11-02 DIAGNOSIS — Z79899 Other long term (current) drug therapy: Secondary | ICD-10-CM | POA: Insufficient documentation

## 2017-11-02 DIAGNOSIS — C3492 Malignant neoplasm of unspecified part of left bronchus or lung: Secondary | ICD-10-CM

## 2017-11-02 DIAGNOSIS — D6481 Anemia due to antineoplastic chemotherapy: Secondary | ICD-10-CM | POA: Diagnosis not present

## 2017-11-02 DIAGNOSIS — C7951 Secondary malignant neoplasm of bone: Secondary | ICD-10-CM | POA: Diagnosis not present

## 2017-11-02 DIAGNOSIS — E876 Hypokalemia: Secondary | ICD-10-CM | POA: Insufficient documentation

## 2017-11-02 MED ORDER — PEGFILGRASTIM-CBQV 6 MG/0.6ML ~~LOC~~ SOSY
6.0000 mg | PREFILLED_SYRINGE | Freq: Once | SUBCUTANEOUS | Status: AC
  Administered 2017-11-02: 6 mg via SUBCUTANEOUS

## 2017-11-02 MED ORDER — PEGFILGRASTIM-CBQV 6 MG/0.6ML ~~LOC~~ SOSY
PREFILLED_SYRINGE | SUBCUTANEOUS | Status: AC
  Filled 2017-11-02: qty 0.6

## 2017-11-02 NOTE — Telephone Encounter (Signed)
"  My hi=usband was to have an injection Saturday.  We have not received a call.  Do you know what time?"  Provided today's appointment information asking to arrive at 10:15 for registration.  Denies further needs or questions at this time.

## 2017-11-02 NOTE — Patient Instructions (Signed)
Pegfilgrastim injection What is this medicine? PEGFILGRASTIM (PEG fil gra stim) is a long-acting granulocyte colony-stimulating factor that stimulates the growth of neutrophils, a type of white blood cell important in the body's fight against infection. It is used to reduce the incidence of fever and infection in patients with certain types of cancer who are receiving chemotherapy that affects the bone marrow, and to increase survival after being exposed to high doses of radiation. This medicine may be used for other purposes; ask your health care provider or pharmacist if you have questions. COMMON BRAND NAME(S): Neulasta What should I tell my health care provider before I take this medicine? They need to know if you have any of these conditions: -kidney disease -latex allergy -ongoing radiation therapy -sickle cell disease -skin reactions to acrylic adhesives (On-Body Injector only) -an unusual or allergic reaction to pegfilgrastim, filgrastim, other medicines, foods, dyes, or preservatives -pregnant or trying to get pregnant -breast-feeding How should I use this medicine? This medicine is for injection under the skin. If you get this medicine at home, you will be taught how to prepare and give the pre-filled syringe or how to use the On-body Injector. Refer to the patient Instructions for Use for detailed instructions. Use exactly as directed. Tell your healthcare provider immediately if you suspect that the On-body Injector may not have performed as intended or if you suspect the use of the On-body Injector resulted in a missed or partial dose. It is important that you put your used needles and syringes in a special sharps container. Do not put them in a trash can. If you do not have a sharps container, call your pharmacist or healthcare provider to get one. Talk to your pediatrician regarding the use of this medicine in children. While this drug may be prescribed for selected conditions,  precautions do apply. Overdosage: If you think you have taken too much of this medicine contact a poison control center or emergency room at once. NOTE: This medicine is only for you. Do not share this medicine with others. What if I miss a dose? It is important not to miss your dose. Call your doctor or health care professional if you miss your dose. If you miss a dose due to an On-body Injector failure or leakage, a new dose should be administered as soon as possible using a single prefilled syringe for manual use. What may interact with this medicine? Interactions have not been studied. Give your health care provider a list of all the medicines, herbs, non-prescription drugs, or dietary supplements you use. Also tell them if you smoke, drink alcohol, or use illegal drugs. Some items may interact with your medicine. This list may not describe all possible interactions. Give your health care provider a list of all the medicines, herbs, non-prescription drugs, or dietary supplements you use. Also tell them if you smoke, drink alcohol, or use illegal drugs. Some items may interact with your medicine. What should I watch for while using this medicine? You may need blood work done while you are taking this medicine. If you are going to need a MRI, CT scan, or other procedure, tell your doctor that you are using this medicine (On-Body Injector only). What side effects may I notice from receiving this medicine? Side effects that you should report to your doctor or health care professional as soon as possible: -allergic reactions like skin rash, itching or hives, swelling of the face, lips, or tongue -dizziness -fever -pain, redness, or irritation at site   where injected -pinpoint red spots on the skin -red or dark-brown urine -shortness of breath or breathing problems -stomach or side pain, or pain at the shoulder -swelling -tiredness -trouble passing urine or change in the amount of urine Side  effects that usually do not require medical attention (report to your doctor or health care professional if they continue or are bothersome): -bone pain -muscle pain This list may not describe all possible side effects. Call your doctor for medical advice about side effects. You may report side effects to FDA at 1-800-FDA-1088. Where should I keep my medicine? Keep out of the reach of children. Store pre-filled syringes in a refrigerator between 2 and 8 degrees C (36 and 46 degrees F). Do not freeze. Keep in carton to protect from light. Throw away this medicine if it is left out of the refrigerator for more than 48 hours. Throw away any unused medicine after the expiration date. NOTE: This sheet is a summary. It may not cover all possible information. If you have questions about this medicine, talk to your doctor, pharmacist, or health care provider.  2018 Elsevier/Gold Standard (2016-07-17 12:58:03)  

## 2017-11-04 ENCOUNTER — Inpatient Hospital Stay: Payer: BLUE CROSS/BLUE SHIELD

## 2017-11-04 ENCOUNTER — Other Ambulatory Visit: Payer: Self-pay | Admitting: Family Medicine

## 2017-11-04 DIAGNOSIS — Z5112 Encounter for antineoplastic immunotherapy: Secondary | ICD-10-CM | POA: Diagnosis not present

## 2017-11-04 DIAGNOSIS — Z713 Dietary counseling and surveillance: Secondary | ICD-10-CM

## 2017-11-04 DIAGNOSIS — C3492 Malignant neoplasm of unspecified part of left bronchus or lung: Secondary | ICD-10-CM

## 2017-11-04 DIAGNOSIS — R1310 Dysphagia, unspecified: Secondary | ICD-10-CM

## 2017-11-04 DIAGNOSIS — E876 Hypokalemia: Secondary | ICD-10-CM

## 2017-11-04 LAB — CBC WITH DIFFERENTIAL (CANCER CENTER ONLY)
BASOS ABS: 0 10*3/uL (ref 0.0–0.1)
BASOS PCT: 0 %
Eosinophils Absolute: 0 10*3/uL (ref 0.0–0.5)
Eosinophils Relative: 1 %
HEMATOCRIT: 29.7 % — AB (ref 38.4–49.9)
HEMOGLOBIN: 9.9 g/dL — AB (ref 13.0–17.1)
Lymphocytes Relative: 43 %
Lymphs Abs: 0.8 10*3/uL — ABNORMAL LOW (ref 0.9–3.3)
MCH: 32.4 pg (ref 27.2–33.4)
MCHC: 33.3 g/dL (ref 32.0–36.0)
MCV: 97.1 fL (ref 79.3–98.0)
MONOS PCT: 1 %
Monocytes Absolute: 0 10*3/uL — ABNORMAL LOW (ref 0.1–0.9)
NEUTROS ABS: 1 10*3/uL — AB (ref 1.5–6.5)
NEUTROS PCT: 55 %
Platelet Count: 42 10*3/uL — ABNORMAL LOW (ref 140–400)
RBC: 3.06 MIL/uL — AB (ref 4.20–5.82)
RDW: 15.7 % — ABNORMAL HIGH (ref 11.0–14.6)
WBC: 1.8 10*3/uL — AB (ref 4.0–10.3)

## 2017-11-04 LAB — CMP (CANCER CENTER ONLY)
ALT: 16 U/L (ref 0–55)
ANION GAP: 8 (ref 3–11)
AST: 11 U/L (ref 5–34)
Albumin: 3.2 g/dL — ABNORMAL LOW (ref 3.5–5.0)
Alkaline Phosphatase: 78 U/L (ref 40–150)
BUN: 25 mg/dL (ref 7–26)
CO2: 24 mmol/L (ref 22–29)
Calcium: 9.4 mg/dL (ref 8.4–10.4)
Chloride: 100 mmol/L (ref 98–109)
Creatinine: 1.54 mg/dL — ABNORMAL HIGH (ref 0.70–1.30)
GFR, EST AFRICAN AMERICAN: 53 mL/min — AB (ref >60)
GFR, EST NON AFRICAN AMERICAN: 46 mL/min — AB (ref >60)
Glucose, Bld: 194 mg/dL — ABNORMAL HIGH (ref 70–140)
Potassium: 3.8 mmol/L (ref 3.5–5.1)
SODIUM: 132 mmol/L — AB (ref 136–145)
TOTAL PROTEIN: 6.3 g/dL — AB (ref 6.4–8.3)
Total Bilirubin: 0.7 mg/dL (ref 0.2–1.2)

## 2017-11-04 LAB — TSH: TSH: 0.29 u[IU]/mL — AB (ref 0.320–4.118)

## 2017-11-05 ENCOUNTER — Ambulatory Visit
Admission: RE | Admit: 2017-11-05 | Discharge: 2017-11-05 | Disposition: A | Discharge status: Home / Self Care | Payer: Medicare Other | Source: Ambulatory Visit | Attending: Family Medicine | Admitting: Family Medicine

## 2017-11-05 ENCOUNTER — Telehealth: Payer: Self-pay | Admitting: *Deleted

## 2017-11-05 ENCOUNTER — Other Ambulatory Visit: Payer: Self-pay | Admitting: Family Medicine

## 2017-11-05 DIAGNOSIS — R1310 Dysphagia, unspecified: Secondary | ICD-10-CM

## 2017-11-05 DIAGNOSIS — C3400 Malignant neoplasm of unspecified main bronchus: Secondary | ICD-10-CM

## 2017-11-05 NOTE — Telephone Encounter (Signed)
Patient called and states he needs a refill of his Pacerone. His pharmacy is [facility] in [place]. Please advise. Thank you

## 2017-11-06 ENCOUNTER — Other Ambulatory Visit: Payer: Self-pay | Admitting: *Deleted

## 2017-11-06 ENCOUNTER — Telehealth: Payer: Self-pay | Admitting: *Deleted

## 2017-11-06 ENCOUNTER — Other Ambulatory Visit: Payer: Self-pay

## 2017-11-06 DIAGNOSIS — C3492 Malignant neoplasm of unspecified part of left bronchus or lung: Secondary | ICD-10-CM

## 2017-11-06 MED ORDER — AMIODARONE HCL 200 MG PO TABS: 200.0000 mg | ORAL_TABLET | Freq: Every day | ORAL | 0 refills | Status: DC

## 2017-11-06 NOTE — Telephone Encounter (Signed)
Pt wife called with concerns regarding pt fatigue, weakness, nose bleeds (mild). 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 No fever, chills, night sweats, n/v, diarrhea or constipation. Reviewed with NP Instructed pt wife to expect a call from scheduling. With appt time for IVF on Saturday.

## 2017-11-06 NOTE — Telephone Encounter (Signed)
amiodarone (PACERONE) 200 MG tablet 90 tablet 0 11/06/2017    Sig - Route: Take 1 tablet (200 mg total) by mouth daily. - Oral   Sent to pharmacy as: amiodarone (PACERONE) 200 MG tablet   Notes to Pharmacy: Please schedule an appointment with Dr. Rockey Situ for further refills, Thanks ! [phone]   E-Prescribing Status: Receipt confirmed by pharmacy (11/06/2017 7:25 AM EDT)   Pharmacy   [id number] - [place], [place]

## 2017-11-07 ENCOUNTER — Inpatient Hospital Stay: Payer: BLUE CROSS/BLUE SHIELD

## 2017-11-07 VITALS — BP 96/56 | HR 90 | Temp 97.8°F | Resp 18

## 2017-11-07 DIAGNOSIS — C3402 Malignant neoplasm of left main bronchus: Secondary | ICD-10-CM

## 2017-11-07 DIAGNOSIS — C3492 Malignant neoplasm of unspecified part of left bronchus or lung: Secondary | ICD-10-CM

## 2017-11-07 DIAGNOSIS — Z95828 Presence of other vascular implants and grafts: Secondary | ICD-10-CM

## 2017-11-07 DIAGNOSIS — Z5112 Encounter for antineoplastic immunotherapy: Secondary | ICD-10-CM | POA: Diagnosis not present

## 2017-11-07 MED ORDER — SODIUM CHLORIDE 0.9% FLUSH
10.0000 mL | INTRAVENOUS | Status: DC | PRN
Start: 2017-11-07 — End: 2017-11-07
  Administered 2017-11-07: 10 mL via INTRAVENOUS
  Filled 2017-11-07: qty 10

## 2017-11-07 MED ORDER — SODIUM CHLORIDE 0.9 % IV SOLN
1000.0000 mL | INTRAVENOUS | Status: AC
  Administered 2017-11-07: 1000 mL via INTRAVENOUS

## 2017-11-07 MED ORDER — HEPARIN SOD (PORK) LOCK FLUSH 100 UNIT/ML IV SOLN
500.0000 [IU] | Freq: Once | INTRAVENOUS | Status: AC | PRN
  Administered 2017-11-07: 500 [IU] via INTRAVENOUS
  Filled 2017-11-07: qty 5

## 2017-11-07 NOTE — Progress Notes (Signed)
Wife reports that temperature went to 101.7 @ 0600. 0930: 100.6. On arrival at the [facility] is 97.8. Pt reports sore throat, nausea and vomiting 1-2 times per day @X  2 weeks. Is here for IVF.   Wife states he slept under an electric blanket last night.    Dr Alvy Bimler notified. Instructed to follow up with Dr Julien Nordmann on Monday.

## 2017-11-07 NOTE — Patient Instructions (Signed)
Dehydration, Adult Dehydration is when there is not enough fluid or water in your body. This happens when you lose more fluids than you take in. Dehydration can range from mild to very bad. It should be treated right away to keep it from getting very bad. Symptoms of mild dehydration may include:  Thirst.  Dry lips.  Slightly dry mouth.  Dry, warm skin.  Dizziness. Symptoms of moderate dehydration may include:  Very dry mouth.  Muscle cramps.  Dark pee (urine). Pee may be the color of tea.  Your body making less pee.  Your eyes making fewer tears.  Heartbeat that is uneven or faster than normal (palpitations).  Headache.  Light-headedness, especially when you stand up from sitting.  Fainting (syncope). Symptoms of very bad dehydration may include:  Changes in skin, such as: ? Cold and clammy skin. ? Blotchy (mottled) or pale skin. ? Skin that does not quickly return to normal after being lightly pinched and let go (poor skin turgor).  Changes in body fluids, such as: ? Feeling very thirsty. ? Your eyes making fewer tears. ? Not sweating when body temperature is high, such as in hot weather. ? Your body making very little pee.  Changes in vital signs, such as: ? Weak pulse. ? Pulse that is more than 100 beats a minute when you are sitting still. ? Fast breathing. ? Low blood pressure.  Other changes, such as: ? Sunken eyes. ? Cold hands and feet. ? Confusion. ? Lack of energy (lethargy). ? Trouble waking up from sleep. ? Short-term weight loss. ? Unconsciousness. Follow these instructions at home:  If told by your doctor, drink an ORS: ? Make an ORS by using instructions on the package. ? Start by drinking small amounts, about  cup (120 mL) every 5-10 minutes. ? Slowly drink more until you have had the amount that your doctor said to have.  Drink enough clear fluid to keep your pee clear or pale yellow. If you were told to drink an ORS, finish the ORS  first, then start slowly drinking clear fluids. Drink fluids such as: ? Water. Do not drink only water by itself. Doing that can make the salt (sodium) level in your body get too low (hyponatremia). ? Ice chips. ? Fruit juice that you have added water to (diluted). ? Low-calorie sports drinks.  Avoid: ? Alcohol. ? Drinks that have a lot of sugar. These include high-calorie sports drinks, fruit juice that does not have water added, and soda. ? Caffeine. ? Foods that are greasy or have a lot of fat or sugar.  Take over-the-counter and prescription medicines only as told by your doctor.  Do not take salt tablets. Doing that can make the salt level in your body get too high (hypernatremia).  Eat foods that have minerals (electrolytes). Examples include bananas, oranges, potatoes, tomatoes, and spinach.  Keep all follow-up visits as told by your doctor. This is important. Contact a doctor if:  You have belly (abdominal) pain that: ? Gets worse. ? Stays in one area (localizes).  You have a rash.  You have a stiff neck.  You get angry or annoyed more easily than normal (irritability).  You are more sleepy than normal.  You have a harder time waking up than normal.  You feel: ? Weak. ? Dizzy. ? Very thirsty.  You have peed (urinated) only a small amount of very dark pee during 6-8 hours. Get help right away if:  You have symptoms of   very bad dehydration.  You cannot drink fluids without throwing up (vomiting).  Your symptoms get worse with treatment.  You have a fever.  You have a very bad headache.  You are throwing up or having watery poop (diarrhea) and it: ? Gets worse. ? Does not go away.  You have blood or something green (bile) in your throw-up.  You have blood in your poop (stool). This may cause poop to look black and tarry.  You have not peed in 6-8 hours.  You pass out (faint).  Your heart rate when you are sitting still is more than 100 beats a  minute.  You have trouble breathing. This information is not intended to replace advice given to you by your health care provider. Make sure you discuss any questions you have with your health care provider. Document Released: 05/17/2009 Document Revised: 02/08/2016 Document Reviewed: 09/14/2015 Elsevier Interactive Patient Education  2018 [employer]  

## 2017-11-09 ENCOUNTER — Inpatient Hospital Stay (HOSPITAL_BASED_OUTPATIENT_CLINIC_OR_DEPARTMENT_OTHER): Payer: BLUE CROSS/BLUE SHIELD | Admitting: Medical

## 2017-11-09 ENCOUNTER — Other Ambulatory Visit: Payer: Self-pay

## 2017-11-09 ENCOUNTER — Telehealth: Payer: Self-pay | Admitting: Medical Oncology

## 2017-11-09 ENCOUNTER — Inpatient Hospital Stay: Payer: BLUE CROSS/BLUE SHIELD

## 2017-11-09 ENCOUNTER — Other Ambulatory Visit: Payer: Self-pay | Admitting: Medical Oncology

## 2017-11-09 ENCOUNTER — Inpatient Hospital Stay (HOSPITAL_COMMUNITY)
Admission: EM | Admit: 2017-11-09 | Discharge: 2017-11-13 | DRG: 809 | Disposition: A | Discharge status: Home Health | Payer: BLUE CROSS/BLUE SHIELD | Attending: Internal Medicine | Admitting: Internal Medicine

## 2017-11-09 VITALS — BP 92/56 | HR 104 | Temp 98.6°F | Resp 17 | Ht 73.0 in | Wt 157.4 lb

## 2017-11-09 DIAGNOSIS — C3412 Malignant neoplasm of upper lobe, left bronchus or lung: Secondary | ICD-10-CM | POA: Diagnosis present

## 2017-11-09 DIAGNOSIS — C787 Secondary malignant neoplasm of liver and intrahepatic bile duct: Secondary | ICD-10-CM | POA: Diagnosis present

## 2017-11-09 DIAGNOSIS — C7889 Secondary malignant neoplasm of other digestive organs: Secondary | ICD-10-CM | POA: Diagnosis present

## 2017-11-09 DIAGNOSIS — D696 Thrombocytopenia, unspecified: Secondary | ICD-10-CM | POA: Diagnosis present

## 2017-11-09 DIAGNOSIS — C3401 Malignant neoplasm of right main bronchus: Secondary | ICD-10-CM | POA: Diagnosis not present

## 2017-11-09 DIAGNOSIS — Z96652 Presence of left artificial knee joint: Secondary | ICD-10-CM | POA: Diagnosis present

## 2017-11-09 DIAGNOSIS — R197 Diarrhea, unspecified: Secondary | ICD-10-CM | POA: Diagnosis present

## 2017-11-09 DIAGNOSIS — E876 Hypokalemia: Secondary | ICD-10-CM | POA: Diagnosis present

## 2017-11-09 DIAGNOSIS — I129 Hypertensive chronic kidney disease with stage 1 through stage 4 chronic kidney disease, or unspecified chronic kidney disease: Secondary | ICD-10-CM | POA: Diagnosis present

## 2017-11-09 DIAGNOSIS — K219 Gastro-esophageal reflux disease without esophagitis: Secondary | ICD-10-CM | POA: Diagnosis present

## 2017-11-09 DIAGNOSIS — M109 Gout, unspecified: Secondary | ICD-10-CM | POA: Diagnosis present

## 2017-11-09 DIAGNOSIS — N179 Acute kidney failure, unspecified: Secondary | ICD-10-CM | POA: Diagnosis present

## 2017-11-09 DIAGNOSIS — M1712 Unilateral primary osteoarthritis, left knee: Secondary | ICD-10-CM | POA: Diagnosis present

## 2017-11-09 DIAGNOSIS — I959 Hypotension, unspecified: Secondary | ICD-10-CM | POA: Diagnosis present

## 2017-11-09 DIAGNOSIS — Z8349 Family history of other endocrine, nutritional and metabolic diseases: Secondary | ICD-10-CM

## 2017-11-09 DIAGNOSIS — T451X5A Adverse effect of antineoplastic and immunosuppressive drugs, initial encounter: Secondary | ICD-10-CM | POA: Diagnosis present

## 2017-11-09 DIAGNOSIS — I480 Paroxysmal atrial fibrillation: Secondary | ICD-10-CM | POA: Diagnosis present

## 2017-11-09 DIAGNOSIS — C3492 Malignant neoplasm of unspecified part of left bronchus or lung: Secondary | ICD-10-CM | POA: Diagnosis not present

## 2017-11-09 DIAGNOSIS — D701 Agranulocytosis secondary to cancer chemotherapy: Secondary | ICD-10-CM

## 2017-11-09 DIAGNOSIS — E860 Dehydration: Secondary | ICD-10-CM

## 2017-11-09 DIAGNOSIS — E785 Hyperlipidemia, unspecified: Secondary | ICD-10-CM | POA: Diagnosis present

## 2017-11-09 DIAGNOSIS — Z888 Allergy status to other drugs, medicaments and biological substances status: Secondary | ICD-10-CM

## 2017-11-09 DIAGNOSIS — K5900 Constipation, unspecified: Secondary | ICD-10-CM | POA: Diagnosis present

## 2017-11-09 DIAGNOSIS — N189 Chronic kidney disease, unspecified: Secondary | ICD-10-CM | POA: Diagnosis not present

## 2017-11-09 DIAGNOSIS — J449 Chronic obstructive pulmonary disease, unspecified: Secondary | ICD-10-CM | POA: Diagnosis present

## 2017-11-09 DIAGNOSIS — Z794 Long term (current) use of insulin: Secondary | ICD-10-CM

## 2017-11-09 DIAGNOSIS — G4700 Insomnia, unspecified: Secondary | ICD-10-CM | POA: Diagnosis present

## 2017-11-09 DIAGNOSIS — D61810 Antineoplastic chemotherapy induced pancytopenia: Principal | ICD-10-CM | POA: Diagnosis present

## 2017-11-09 DIAGNOSIS — Z823 Family history of stroke: Secondary | ICD-10-CM

## 2017-11-09 DIAGNOSIS — K1230 Oral mucositis (ulcerative), unspecified: Secondary | ICD-10-CM | POA: Diagnosis present

## 2017-11-09 DIAGNOSIS — N183 Chronic kidney disease, stage 3 (moderate): Secondary | ICD-10-CM | POA: Diagnosis present

## 2017-11-09 DIAGNOSIS — Z833 Family history of diabetes mellitus: Secondary | ICD-10-CM

## 2017-11-09 DIAGNOSIS — Z955 Presence of coronary angioplasty implant and graft: Secondary | ICD-10-CM

## 2017-11-09 DIAGNOSIS — R1310 Dysphagia, unspecified: Secondary | ICD-10-CM | POA: Diagnosis not present

## 2017-11-09 DIAGNOSIS — C3490 Malignant neoplasm of unspecified part of unspecified bronchus or lung: Secondary | ICD-10-CM | POA: Diagnosis present

## 2017-11-09 DIAGNOSIS — K8681 Exocrine pancreatic insufficiency: Secondary | ICD-10-CM | POA: Diagnosis present

## 2017-11-09 DIAGNOSIS — R4702 Dysphasia: Secondary | ICD-10-CM | POA: Diagnosis present

## 2017-11-09 DIAGNOSIS — E1151 Type 2 diabetes mellitus with diabetic peripheral angiopathy without gangrene: Secondary | ICD-10-CM | POA: Diagnosis present

## 2017-11-09 DIAGNOSIS — E1122 Type 2 diabetes mellitus with diabetic chronic kidney disease: Secondary | ICD-10-CM | POA: Diagnosis present

## 2017-11-09 DIAGNOSIS — D649 Anemia, unspecified: Secondary | ICD-10-CM | POA: Diagnosis present

## 2017-11-09 DIAGNOSIS — Z5112 Encounter for antineoplastic immunotherapy: Secondary | ICD-10-CM | POA: Diagnosis present

## 2017-11-09 DIAGNOSIS — R791 Abnormal coagulation profile: Secondary | ICD-10-CM | POA: Diagnosis present

## 2017-11-09 DIAGNOSIS — Z7901 Long term (current) use of anticoagulants: Secondary | ICD-10-CM

## 2017-11-09 DIAGNOSIS — Z8249 Family history of ischemic heart disease and other diseases of the circulatory system: Secondary | ICD-10-CM

## 2017-11-09 DIAGNOSIS — K921 Melena: Secondary | ICD-10-CM | POA: Diagnosis present

## 2017-11-09 DIAGNOSIS — I2510 Atherosclerotic heart disease of native coronary artery without angina pectoris: Secondary | ICD-10-CM | POA: Diagnosis present

## 2017-11-09 DIAGNOSIS — K208 Other esophagitis: Secondary | ICD-10-CM | POA: Diagnosis not present

## 2017-11-09 DIAGNOSIS — R531 Weakness: Secondary | ICD-10-CM | POA: Diagnosis not present

## 2017-11-09 DIAGNOSIS — Z79899 Other long term (current) drug therapy: Secondary | ICD-10-CM | POA: Diagnosis not present

## 2017-11-09 DIAGNOSIS — Z95828 Presence of other vascular implants and grafts: Secondary | ICD-10-CM

## 2017-11-09 DIAGNOSIS — Z86010 Personal history of colonic polyps: Secondary | ICD-10-CM

## 2017-11-09 DIAGNOSIS — D6481 Anemia due to antineoplastic chemotherapy: Secondary | ICD-10-CM

## 2017-11-09 DIAGNOSIS — D61818 Other pancytopenia: Secondary | ICD-10-CM | POA: Diagnosis not present

## 2017-11-09 DIAGNOSIS — Z8673 Personal history of transient ischemic attack (TIA), and cerebral infarction without residual deficits: Secondary | ICD-10-CM

## 2017-11-09 DIAGNOSIS — C7951 Secondary malignant neoplasm of bone: Secondary | ICD-10-CM | POA: Diagnosis present

## 2017-11-09 DIAGNOSIS — Z87891 Personal history of nicotine dependence: Secondary | ICD-10-CM

## 2017-11-09 DIAGNOSIS — Z923 Personal history of irradiation: Secondary | ICD-10-CM

## 2017-11-09 LAB — CMP (CANCER CENTER ONLY)
ALT: 17 U/L (ref 0–55)
AST: 12 U/L (ref 5–34)
Albumin: 2.3 g/dL — ABNORMAL LOW (ref 3.5–5.0)
Alkaline Phosphatase: 78 U/L (ref 40–150)
Anion gap: 11 (ref 3–11)
BUN: 34 mg/dL — AB (ref 7–26)
CHLORIDE: 95 mmol/L — AB (ref 98–109)
CO2: 25 mmol/L (ref 22–29)
CREATININE: 2.09 mg/dL — AB (ref 0.70–1.30)
Calcium: 9.6 mg/dL (ref 8.4–10.4)
GFR, Est AFR Am: 37 mL/min — ABNORMAL LOW (ref >60)
GFR, Estimated: 32 mL/min — ABNORMAL LOW (ref >60)
Glucose, Bld: 144 mg/dL — ABNORMAL HIGH (ref 70–140)
Potassium: 2.9 mmol/L — CL (ref 3.5–5.1)
SODIUM: 131 mmol/L — AB (ref 136–145)
Total Bilirubin: 0.8 mg/dL (ref 0.2–1.2)
Total Protein: 6.3 g/dL — ABNORMAL LOW (ref 6.4–8.3)

## 2017-11-09 LAB — PROTIME-INR
INR: 4.2 — AB
PROTHROMBIN TIME: 40.2 s — AB (ref 11.4–15.2)

## 2017-11-09 LAB — CBC WITH DIFFERENTIAL (CANCER CENTER ONLY)
Basophils Absolute: 0 10*3/uL (ref 0.0–0.1)
Basophils Relative: 0 %
EOS ABS: 0 10*3/uL (ref 0.0–0.5)
EOS PCT: 2 %
HCT: 21.8 % — ABNORMAL LOW (ref 38.4–49.9)
Hemoglobin: 7.5 g/dL — ABNORMAL LOW (ref 13.0–17.1)
LYMPHS ABS: 0.6 10*3/uL — AB (ref 0.9–3.3)
Lymphocytes Relative: 59 %
MCH: 32.3 pg (ref 27.2–33.4)
MCHC: 34.6 g/dL (ref 32.0–36.0)
MCV: 93.6 fL (ref 79.3–98.0)
MONOS PCT: 7 %
Monocytes Absolute: 0.1 10*3/uL (ref 0.1–0.9)
Neutro Abs: 0.3 10*3/uL — CL (ref 1.5–6.5)
Neutrophils Relative %: 32 %
PLATELETS: 11 10*3/uL — AB (ref 140–400)
RBC: 2.33 MIL/uL — ABNORMAL LOW (ref 4.20–5.82)
RDW: 15.6 % — ABNORMAL HIGH (ref 11.0–14.6)
WBC Count: 0.9 10*3/uL — CL (ref 4.0–10.3)

## 2017-11-09 LAB — I-STAT CG4 LACTIC ACID, ED: Lactic Acid, Venous: 0.91 mmol/L (ref 0.5–1.9)

## 2017-11-09 LAB — MAGNESIUM: MAGNESIUM: 2 mg/dL (ref 1.7–2.4)

## 2017-11-09 LAB — APTT: APTT: 59 s — AB (ref 24–36)

## 2017-11-09 LAB — PREPARE RBC (CROSSMATCH)

## 2017-11-09 MED ORDER — SODIUM CHLORIDE 0.9 % IV SOLN
Freq: Once | INTRAVENOUS | Status: AC
  Administered 2017-11-10: 01:00:00 via INTRAVENOUS

## 2017-11-09 MED ORDER — PROCHLORPERAZINE EDISYLATE 5 MG/ML IJ SOLN
10.0000 mg | Freq: Four times a day (QID) | INTRAMUSCULAR | Status: DC | PRN
  Administered 2017-11-09: 10 mg via INTRAVENOUS
  Filled 2017-11-09: qty 2

## 2017-11-09 MED ORDER — SODIUM CHLORIDE 0.9 % IV SOLN
12.5000 mg | Freq: Four times a day (QID) | INTRAVENOUS | Status: DC | PRN
  Filled 2017-11-09: qty 0.5

## 2017-11-09 MED ORDER — SODIUM CHLORIDE 0.9 % IV SOLN: Freq: Once | INTRAVENOUS | Status: DC

## 2017-11-09 MED ORDER — HEPARIN SOD (PORK) LOCK FLUSH 100 UNIT/ML IV SOLN
250.0000 [IU] | INTRAVENOUS | Status: DC | PRN
  Filled 2017-11-09: qty 2.5
  Filled 2017-11-09: qty 5

## 2017-11-09 MED ORDER — ACETAMINOPHEN 325 MG PO TABS
650.0000 mg | ORAL_TABLET | Freq: Once | ORAL | Status: AC
  Administered 2017-11-09: 650 mg via ORAL
  Filled 2017-11-09: qty 2

## 2017-11-09 MED ORDER — SODIUM CHLORIDE 0.9 % IV BOLUS (SEPSIS)
250.0000 mL | Freq: Once | INTRAVENOUS | Status: AC
  Administered 2017-11-09: 250 mL via INTRAVENOUS

## 2017-11-09 MED ORDER — HEPARIN SOD (PORK) LOCK FLUSH 100 UNIT/ML IV SOLN
500.0000 [IU] | Freq: Once | INTRAVENOUS | Status: AC
  Administered 2017-11-09: 500 [IU] via INTRAVENOUS
  Filled 2017-11-09: qty 5

## 2017-11-09 MED ORDER — ACETAMINOPHEN 650 MG RE SUPP: 650.0000 mg | Freq: Four times a day (QID) | RECTAL | Status: DC | PRN

## 2017-11-09 MED ORDER — PIPERACILLIN-TAZOBACTAM 3.375 G IVPB
3.3750 g | Freq: Three times a day (TID) | INTRAVENOUS | Status: DC
Start: 2017-11-10 — End: 2017-11-12
  Administered 2017-11-10 – 2017-11-12 (×7): 3.375 g via INTRAVENOUS
  Filled 2017-11-09 (×8): qty 50

## 2017-11-09 MED ORDER — SODIUM CHLORIDE 0.9 % IV BOLUS (SEPSIS)
1000.0000 mL | Freq: Once | INTRAVENOUS | Status: AC
  Administered 2017-11-09: 1000 mL via INTRAVENOUS

## 2017-11-09 MED ORDER — ACETAMINOPHEN 325 MG PO TABS
650.0000 mg | ORAL_TABLET | Freq: Four times a day (QID) | ORAL | Status: DC | PRN
  Administered 2017-11-11 – 2017-11-12 (×2): 650 mg via ORAL
  Filled 2017-11-09 (×2): qty 2

## 2017-11-09 MED ORDER — MORPHINE SULFATE (PF) 2 MG/ML IV SOLN: 2.0000 mg | INTRAVENOUS | Status: DC | PRN

## 2017-11-09 MED ORDER — SODIUM CHLORIDE 0.9 % IV SOLN
INTRAVENOUS | Status: DC
  Administered 2017-11-10 (×2): via INTRAVENOUS

## 2017-11-09 MED ORDER — ALBUTEROL SULFATE (2.5 MG/3ML) 0.083% IN NEBU: 2.5000 mg | INHALATION_SOLUTION | Freq: Four times a day (QID) | RESPIRATORY_TRACT | Status: DC | PRN

## 2017-11-09 MED ORDER — POTASSIUM CHLORIDE 10 MEQ/100ML IV SOLN
10.0000 meq | INTRAVENOUS | Status: AC
  Administered 2017-11-09 – 2017-11-10 (×5): 10 meq via INTRAVENOUS
  Filled 2017-11-09 (×5): qty 100

## 2017-11-09 MED ORDER — PIPERACILLIN-TAZOBACTAM 3.375 G IVPB 30 MIN
3.3750 g | INTRAVENOUS | Status: AC
  Administered 2017-11-09: 3.375 g via INTRAVENOUS
  Filled 2017-11-09: qty 50

## 2017-11-09 MED ORDER — SODIUM CHLORIDE 0.9% FLUSH
10.0000 mL | Freq: Once | INTRAVENOUS | Status: AC
  Administered 2017-11-09: 10 mL via INTRAVENOUS
  Filled 2017-11-09: qty 10

## 2017-11-09 MED ORDER — PANTOPRAZOLE SODIUM 40 MG IV SOLR
40.0000 mg | Freq: Two times a day (BID) | INTRAVENOUS | Status: DC
  Administered 2017-11-09 – 2017-11-13 (×8): 40 mg via INTRAVENOUS
  Filled 2017-11-09 (×8): qty 40

## 2017-11-09 MED ORDER — HEPARIN SOD (PORK) LOCK FLUSH 100 UNIT/ML IV SOLN
250.0000 [IU] | INTRAVENOUS | Status: DC | PRN
  Filled 2017-11-09: qty 2.5

## 2017-11-09 MED ORDER — SODIUM CHLORIDE 0.9 % IV BOLUS
500.0000 mL | Freq: Once | INTRAVENOUS | Status: AC
  Administered 2017-11-09: 500 mL via INTRAVENOUS

## 2017-11-09 MED ORDER — AMIODARONE HCL 200 MG PO TABS
200.0000 mg | ORAL_TABLET | Freq: Every day | ORAL | Status: DC
  Administered 2017-11-10 – 2017-11-13 (×4): 200 mg via ORAL
  Filled 2017-11-09 (×4): qty 1

## 2017-11-09 NOTE — Progress Notes (Signed)
Pharmacy Antibiotic Note  Jesse Avila is a 67 y.o. male admitted on 11/09/2017 with chemotherapy-induced neutropenia as well as symptomatic anemia/possible GI bleed, thrombocytopenia, hypotension, AKI. Pharmacy has been consulted for Zosyn dosing.  Plan: Zosyn 3.375g IV q8h (infuse over 4 hours) Monitor renal function, cultures, clinical course.      Temp (24hrs), Avg:98.1 F (36.7 C), Min:97.8 F (36.6 C), Max:98.6 F (37 C)  Recent Labs  Lab 11/04/17 1305 11/09/17 1549 11/09/17 1929  WBC 1.8* 0.9*  --   CREATININE 1.54* 2.09*  --   LATICACIDVEN  --   --  0.91    Estimated Creatinine Clearance: 36.1 mL/min (A) (by C-G formula based on SCr of 2.09 mg/dL (H)).    Allergies  Allergen Reactions  . Cialis [Tadalafil] Other (See Comments)    Headache     Antimicrobials this admission: 4/8 >> Zosyn >>  Dose adjustments this admission: --  Microbiology results: 4/8 BCx at 1715 (at [facility]): sent 4/8 BCx at 1916, 1917: sent   Thank you for allowing pharmacy to be a part of this patient's care.  Luiz Ochoa 11/09/2017 8:40 PM

## 2017-11-09 NOTE — Progress Notes (Signed)
[facility] Progress Note   Jesse Avila [id number] 10/17/1950 67 y.o.  Jesse Avila is managed by Dr. Eilleen Kempf  Actively treated with chemotherapy: yes  Current Therapy: carboplatin and etoposide with Neulasta support  Last Treated: Cycle 1, day 3 of chemotherapy dosed on 10/30/2017 with Neulasta given on 11/02/2017. The patient is status post whole brain radiation.   Assessment: Plan:    Dehydration - Plan: 0.9 %  sodium chloride infusion  Chemotherapy-induced neutropenia (HCC) - Plan: Culture, Blood, Culture, Blood  Anemia due to antineoplastic chemotherapy - Plan: Practitioner attestation of consent, Complete patient signature process for consent form, Care order/instruction, heparin lock flush 100 unit/mL, Type and screen, Prepare RBC, Transfuse RBC, Prepare RBC  Small cell lung carcinoma, left (HCC)   Dehydration: Patient's labs returned today with creatinine at 2.09 and BUN at 34.   He is being transferred to the emergency room for management and for possible admission.  Chemotherapy-induced neutropenia: Patient's CBC returned with a WBC of 0.9 and ANC of 0.3.  He is status post Neulasta which was dosed on 11/02/2017.  Blood cultures x2 were collected. He is being transferred to the emergency room for management and for possible admission.  Anemia due to antineoplastic chemotherapy: A CBC returned with a hemoglobin of 7.5 and hematocrit of 21.8.  A crossmatch for 2 units of packed red blood cells was ordered.  Thrombocytopenia: The patient's platelet count returned at 11,000.  The patient is showing no evidence of bleeding.  A transfusion of 1 unit of platelets has been ordered.  Hypokalemia.  The patient's potassium returned at 2.9. He is being transferred to the emergency room for management and for possible admission.  Please see After Visit Summary for patient specific instructions.  Future Appointments  Date Time Provider [place]    11/11/2017  1:30 PM [facility] LAB 5 [facility] None  11/13/2017  2:30 PM [facility] B6 [facility] None  11/18/2017 10:30 AM [facility] LAB 1 [facility] None  11/18/2017 11:00 AM Curcio, Kristin R, NP [facility] None  11/18/2017 12:15 PM [facility] D11 [facility] None  11/19/2017  1:45 PM [facility] G23 [facility] None  11/20/2017  1:00 PM [facility] B6 [facility] None  11/21/2017 12:00 PM [facility] INJ NURSE [facility] None  11/25/2017  1:30 PM [facility] LAB 3 [facility] None  12/02/2017  1:30 PM [facility] LAB 3 [facility] None  12/09/2017  9:30 AM [facility] LAB 1 [facility] None  12/09/2017 10:00 AM Curt Bears, MD [facility] None  12/09/2017 11:00 AM [facility] G24 [facility] None  12/10/2017  1:30 PM [facility] A1 [facility] None  12/11/2017  1:00 PM [facility] H28 [facility] None  12/12/2017 12:00 PM [facility] INJ NURSE [facility] None  12/16/2017  1:30 PM [facility] LAB 2 [facility] None  12/23/2017  1:30 PM [facility] LAB 2 [facility] None  12/30/2017 10:45 AM [facility] LAB 1 [facility] None  12/30/2017 11:15 AM Curt Bears, MD [facility] None  12/30/2017 12:15 PM [facility] F21 [facility] None  12/31/2017 11:30 AM [facility] C8 [facility] None  01/01/2018 10:45 AM [facility] B7 [facility] None  01/02/2018 12:15 PM [facility] INJ NURSE [facility] None  01/06/2018  1:00 PM [facility] VASC 6 - PS [facility]  01/06/2018  1:45 PM Angelia Mould, MD [facility]  03/05/2018 11:00 AM Parrett, Fonnie Mu, NP [facility] None    Orders Placed This Encounter  Procedures  . Culture, Blood  . Culture, Blood  . Type and screen  . Prepare RBC       Subjective:  Patient ID:  Jesse Avila is a 67 y.o. (DOB [birth year]) male.  Chief Complaint: No chief complaint on file.   HPI Jesse Avila is a 67 year old male with a diagnosis of an extensive stage small cell lung cancer who presented with a large left upper lobe mass with mediastinal invasion,  pancreatic lesion, and a left iliac bone lesion which was suspicious for metastasis.  He is managed by Dr. Eilleen Kempf.  He was last treated with cycle 1, day 3 of chemotherapy with carboplatin and etoposide which was dosed on 10/30/2017 with Neulasta given on 11/02/2017. The patient is status post whole brain radiation.  He presents to the office today with difficulty swallowing.  He attempted to have a barium swallow done at [facility] on Friday but was too weak to stand for the study.  He continues to have difficulty with swallowing.  He has had diarrhea today.  He had recent contact with his sister who was hospitalized with diarrhea and an unknown GI infection.  He presented to the office on Saturday for IV fluids.  He reports that these did not help.  He has had ongoing hiccups for the past 24 hours.  He denies fevers, chills, sweats, nausea, or vomiting.   Medications: I have reviewed the patient's current medications.  Allergies:  Allergies  Allergen Reactions  . Cialis [Tadalafil] Other (See Comments)    Headache     Past Medical History:  Diagnosis Date  . Atrial fibrillation ([place])   . Carotid artery occlusion   . DJD (degenerative joint disease) of knee   . Essential hypertension, benign    takes Benicar daily  . Goals of care, counseling/discussion 09/18/2016  . History of colon polyps    benign  . History of gout   . History of radiation therapy 09/30/16-11/10/16   left lung 60 Gy in 30 fractions  . Hyperlipidemia    takes Fenofibrate and Crestor daily  . Insomnia    takes Ambien nightly as needed  . Joint pain   . Lung cancer ([place])   . Nocturia   . Peripheral vascular disease ([place])   . Primary localized osteoarthritis of left knee 12/26/2014  . Stroke [facility]) 2015?   takes Plavix daily as well as Pletal,not on plavix at present 05/15/15  . 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 ABDOMINAL AORTAGRAM N/A 03/29/2012   Procedure: ABDOMINAL Maxcine Ham;  Surgeon: Angelia Mould, MD;  Location: [facility] CATH LAB;  Service: Cardiovascular;  Laterality: N/A;  . CARDIOVERSION N/A 05/08/2017   Procedure: CARDIOVERSION;  Surgeon: Minna Merritts, MD;  Location: [facility] ORS;  Service: Cardiovascular;  Laterality: N/A;  . COLONOSCOPY    . ENDARTERECTOMY Right 05/09/2014   Procedure: RIGHT CAROTID ENDARTERECTOMY WITH PATCH ANGIOPLASTY;  Surgeon: Conrad [place], MD;  Location: [place];  Service: Vascular;  Laterality: Right;  . ENDARTERECTOMY FEMORAL Right 05/17/2015   Procedure: ENDARTERECTOMY FEMORAL;  Surgeon: Angelia Mould, MD;  Location: [place];  Service: Vascular;  Laterality: Right;  . ENDOBRONCHIAL ULTRASOUND Bilateral 09/08/2016   Procedure: ENDOBRONCHIAL ULTRASOUND;  Surgeon: Rigoberto Noel, MD;  Location: [facility] ENDOSCOPY;  Service: Cardiopulmonary;  Laterality: Bilateral;  . EUS N/A 10/08/2017   Procedure: UPPER ENDOSCOPIC ULTRASOUND (EUS) LINEAR;  Surgeon: [name], MD;  Location: [facility] ENDOSCOPY;  Service:  Endoscopy;  Laterality: N/A;  . FEMORAL-POPLITEAL BYPASS GRAFT Right 05/17/2015   Procedure: BYPASS GRAFT FEMORAL-BELOW KNEE POPLITEAL ARTERY, using gortex propaten graft 6 mm x 80 cm;  Surgeon: Angelia Mould, MD;  Location: [place];  Service: Vascular;  Laterality: Right;  . IR GENERIC HISTORICAL  09/25/2016   IR FLUORO GUIDE PORT INSERTION RIGHT 09/25/2016 Markus Daft, MD [facility]  . IR GENERIC HISTORICAL  09/25/2016   IR US GUIDE VASC ACCESS RIGHT 09/25/2016 Markus Daft, MD [facility]  . KNEE ARTHROSCOPY Right 11/2009  . LOWER EXTREMITY ANGIOGRAM Bilateral 03/23/2015   Procedure: Lower Extremity Angiogram;  Surgeon: Angelia Mould, MD;  Location: [place] CV LAB;  Service: Cardiovascular;  Laterality: Bilateral;  . MENISCUS REPAIR  11/2009  . PARTIAL KNEE  ARTHROPLASTY Left 12/26/2014   Procedure: UNICOMPARTMENTAL KNEE;  Surgeon: Marchia Bond, MD;  Location: [place];  Service: Orthopedics;  Laterality: Left;  . PATCH ANGIOPLASTY Right 05/17/2015   Procedure: VEIN PATCH ANGIOPLASTY ILEOFEMORAL ARTERY;  Surgeon: Angelia Mould, MD;  Location: [place];  Service: Vascular;  Laterality: Right;  . PERCUTANEOUS STENT INTERVENTION N/A 05/10/2012   Procedure: PERCUTANEOUS STENT INTERVENTION;  Surgeon: Angelia Mould, MD;  Location[facility] CATH LAB;  Service: Cardiovascular;  Laterality: N/A;  . PERIPHERAL VASCULAR CATHETERIZATION N/A 03/23/2015   Procedure: Abdominal Aortogram;  Surgeon: Angelia Mould, MD;  Location: [place] CV LAB;  Service: Cardiovascular;  Laterality: N/A;  . STENTS     PLACED IN ??BOTH LEGS   2013?  . TEE WITHOUT CARDIOVERSION N/A 03/06/2017   Procedure: TRANSESOPHAGEAL ECHOCARDIOGRAM (TEE);  Surgeon: Larey Dresser, MD;  Location[facility] ENDOSCOPY;  Service: Cardiovascular;  Laterality: N/A;  . TEE WITHOUT CARDIOVERSION N/A 05/08/2017   Procedure: TRANSESOPHAGEAL ECHOCARDIOGRAM (TEE);  Surgeon: Minna Merritts, MD;  Location: [facility] ORS;  Service: Cardiovascular;  Laterality: N/A;    Family History  Problem Relation Age of Onset  . Diabetes Mother   . Hypertension Mother   . Heart disease Mother        Coronary Artery Bypass Graft  . Hyperlipidemia Mother   . Heart attack Mother   . Hypertension Father   . Hyperlipidemia Sister   . Stroke Sister     Social History   Socioeconomic History  . Marital status: Married    Spouse name: Not on file  . Number of children: Not on file  . Years of education: Not on file  . Highest education level: Not on file  Occupational History  . Not on file  Social Needs  . Financial resource strain: Not on file  . Food insecurity:    Worry: Not on file    Inability: Not on file  . Transportation needs:    Medical: Not on file    Non-medical: Not on file  Tobacco Use    . Smoking status: Former Smoker    Packs/day: 1.00    Years: 20.00    Pack years: 20.00    Types: Cigarettes  . Smokeless tobacco: Never Used  . Tobacco comment: quit smoking 2008  Substance and Sexual Activity  . Alcohol use: Yes    Alcohol/week: 0.0 oz    Comment: beer- occasionally  . Drug use: No  . Sexual activity: Never  Lifestyle  . Physical activity:    Days per week: Not on file    Minutes per session: Not on file  . Stress: Not on file  Relationships  . Social connections:  Talks on phone: Not on file    Gets together: Not on file    Attends religious service: Not on file    Active member of club or organization: Not on file    Attends meetings of clubs or organizations: Not on file    Relationship status: Not on file  . Intimate partner violence:    Fear of current or ex partner: Not on file    Emotionally abused: Not on file    Physically abused: Not on file    Forced sexual activity: Not on file  Other Topics Concern  . Not on file  Social History Narrative  . Not on file    Past Medical History, Surgical history, Social history, and Family history were reviewed and updated as appropriate.   Please see review of systems for further details on the patient's review from today.   Review of Systems:  Review of Systems  Constitutional: Positive for fatigue. Negative for appetite change, chills, diaphoresis and fever.  Respiratory: Negative for cough, chest tightness and shortness of breath.   Cardiovascular: Negative for chest pain, palpitations and leg swelling.  Gastrointestinal: Positive for diarrhea. Negative for abdominal distention, abdominal pain, blood in stool, constipation, nausea and vomiting.       Hiccups times 24 hours  Genitourinary: Negative for decreased urine volume and difficulty urinating.  Neurological: Positive for dizziness. Negative for weakness.    Objective:   Physical Exam:  BP (!) 92/56 (BP Location: Right Arm, Patient  Position: Sitting)   Pulse (!) 104   Temp 98.6 F (37 C) (Oral)   Resp 17   Ht 6\' 1"  (1.854 m)   Wt 157 lb 7 oz (71.4 kg)   SpO2 100%   BMI 20.77 kg/m   ECOG: 2  Physical Exam  Constitutional: No distress.  The patient is an adult chronically ill-appearing male who appears to be markedly fatigued and pale.  HENT:  Head: Normocephalic and atraumatic.  Mouth/Throat: Mucous membranes are dry.  Cardiovascular: S1 normal and S2 normal. Tachycardia present.    Pulmonary/Chest: Effort normal and breath sounds normal. No stridor. No respiratory distress. He has no wheezes. He has no rales.  Abdominal: Soft. Bowel sounds are normal. He exhibits no distension. There is no tenderness. There is no guarding.  Lymphadenopathy:    He has no cervical adenopathy.  Neurological: He is alert. Coordination (The patient is ambulating with the use of a wheelchair.) abnormal.  Skin: Skin is warm and dry. He is not diaphoretic. There is pallor.  Tenting of the dorsal surface of the hands is noted.    Lab Review:     Component Value Date/Time   NA 131 (L) 11/09/2017 1549   NA 137 06/19/2017 0922   K 2.9 (LL) 11/09/2017 1549   K 4.1 06/19/2017 0922   CL 95 (L) 11/09/2017 1549   CO2 25 11/09/2017 1549   CO2 28 06/19/2017 0922   GLUCOSE 144 (H) 11/09/2017 1549   GLUCOSE 211 (H) 06/19/2017 0922   BUN 34 (H) 11/09/2017 1549   BUN 12.6 06/19/2017 0922   CREATININE 2.09 (H) 11/09/2017 1549   CREATININE 1.3 06/19/2017 0922   CALCIUM 9.6 11/09/2017 1549   CALCIUM 9.6 06/19/2017 0922   PROT 6.3 (L) 11/09/2017 1549   PROT 7.2 06/19/2017 0922   ALBUMIN 2.3 (L) 11/09/2017 1549   ALBUMIN 3.3 (L) 06/19/2017 0922   AST 12 11/09/2017 1549   AST 15 06/19/2017 0922   ALT 17 11/09/2017 1549  ALT 14 06/19/2017 0922   ALKPHOS 78 11/09/2017 1549   ALKPHOS 116 06/19/2017 0922   BILITOT 0.8 11/09/2017 1549   BILITOT 0.56 06/19/2017 0922   GFRNONAA 32 (L) 11/09/2017 1549   GFRAA 37 (L) 11/09/2017 1549        Component Value Date/Time   WBC 0.9 (LL) 11/09/2017 1549   WBC 7.4 09/23/2017 1036   RBC 2.33 (L) 11/09/2017 1549   HGB 10.9 (L) 09/23/2017 1036   HGB 10.1 (L) 06/19/2017 0922   HCT 21.8 (L) 11/09/2017 1549   HCT 30.8 (L) 06/19/2017 0922   PLT 11 (L) 11/09/2017 1549   PLT 128 (L) 06/19/2017 0922   MCV 93.6 11/09/2017 1549   MCV 102.0 (H) 06/19/2017 0922   MCH 32.3 11/09/2017 1549   MCHC 34.6 11/09/2017 1549   RDW 15.6 (H) 11/09/2017 1549   RDW 15.1 (H) 06/19/2017 0922   LYMPHSABS 0.6 (L) 11/09/2017 1549   LYMPHSABS 1.1 06/19/2017 0922   MONOABS 0.1 11/09/2017 1549   MONOABS 0.4 06/19/2017 0922   EOSABS 0.0 11/09/2017 1549   EOSABS 0.1 06/19/2017 0922   BASOSABS 0.0 11/09/2017 1549   BASOSABS 0.0 06/19/2017 0922   -------------------------------  Imaging from last 24 hours (if applicable):  Radiology interpretation: No results found.      This case was discussed with Dr. Julien Nordmann. He expressed agreement with my management of this patient.

## 2017-11-09 NOTE — ED Notes (Signed)
Called Lab to find out if blood and platelets were ready.

## 2017-11-09 NOTE — Telephone Encounter (Signed)
NOn-stop Hiccoughs and 1 episode of watery diarrhea- ( incontinent), weak. , ate a popsicle and 2 bites of cereal. Per [facility] today. appts made

## 2017-11-09 NOTE — ED Triage Notes (Signed)
Pt arrived from [facility].  pt is reported to have neutropenia, low platelets and hypotn. Pt has Hx of Colon Cancer. Pt was having a visit d/t not feeling well over the weekend. Pt is accompanied with spouse.

## 2017-11-09 NOTE — ED Provider Notes (Signed)
[facility] DEPT Provider Note  CSN: [id number] Arrival date & time: 11/09/17 1742  Chief Complaint(s) Fatigue  HPI Jesse Avila is a 67 y.o. male   HPI  CC: fatigue  Onset/Duration: 1 week Timing: constant, worsening Location: generalized Severity: moderate to severe Modifying Factors:  Improved by: nothing  Worsened by:  nothing Associated Signs/Symptoms:  Pertinent (+): N/V, one episode of dark, loose BM today. Light headedness  Pertinent (-): SOB, CP, headache, urinary sx Context: Seen at [facility] and noted to hypotensive. Found to have anemia, thrombocytopenia.  Patient was sent here for continued management and likely admission.  Platelet transfusion was already placed.  - h/o cancer on chemo that started 2 weeks ago. - review of record notable for pericardial effusion. - possible sick contact recently with GI sx. - h/o AF on eliquis Past Medical History Past Medical History:  Diagnosis Date  . Atrial fibrillation ([place])   . Carotid artery occlusion   . DJD (degenerative joint disease) of knee   . Essential hypertension, benign    takes Benicar daily  . Goals of care, counseling/discussion 09/18/2016  . History of colon polyps    benign  . History of gout   . History of radiation therapy 09/30/16-11/10/16   left lung 60 Gy in 30 fractions  . Hyperlipidemia    takes Fenofibrate and Crestor daily  . Insomnia    takes Ambien nightly as needed  . Joint pain   . Lung cancer ([place])   . Nocturia   . Peripheral vascular disease ([place])   . Primary localized osteoarthritis of left knee 12/26/2014  . Stroke [facility]) 2015?   takes Plavix daily as well as Pletal,not on plavix at present 05/15/15  . 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 Dehydration 10/19/2017  . Pancreatic  mass   . Thrombus of left atrial appendage following myocardial infarction ([place])   . AKI (acute kidney injury) ([place]) 03/05/2017  . HCAP (healthcare-associated pneumonia)   . Transaminitis   . Pleural effusion on left   . Postobstructive pneumonia 03/01/2017  . Lactic acidosis 03/01/2017  . Acute on chronic diastolic CHF (congestive heart failure) ([place]) 02/02/2017  . Port catheter in place 11/05/2016  . Malignant neoplasm of lung ([place])   . Anemia   . Atrial fibrillation ([place]) 10/21/2016  . Small cell lung carcinoma, left ([place]) 09/18/2016  . Goals of care, counseling/discussion 09/18/2016  . Encounter for antineoplastic chemotherapy 09/18/2016  . Hilar lymphadenopathy   . COPD (chronic obstructive pulmonary disease) ([place]) 09/03/2016  . PAD (peripheral artery disease) ([place]) 05/17/2015  . Primary localized osteoarthritis of left knee 12/26/2014  . Knee osteoarthritis 12/26/2014  . Carotid stenosis 04/21/2014  . History of stroke 04/21/2014  . Carotid stenosis with cerebral infarction less than 8 weeks ago 04/16/2014  . Diabetes mellitus, type 2 ([place]) 04/14/2014  . Obesity 04/14/2014  . Insomnia 10/18/2013  . Preop cardiovascular exam 12/12/2011  . DJD (degenerative joint disease) of knee   . ED (erectile dysfunction)   . GERD (gastroesophageal reflux disease)   . Essential hypertension, benign   . Peripheral vascular disease ([place])   . Hyperlipidemia   . Critical lower limb ischemia 11/19/2011   Home Medication(s) Prior to Admission medications   Medication Sig Start Date End Date Taking? Authorizing Provider  amiodarone (  PACERONE) 200 MG tablet Take 1 tablet (200 mg total) by mouth daily. 11/06/17   Minna Merritts, MD  apixaban (ELIQUIS) 5 MG TABS tablet Take 1 tablet (5 mg total) by mouth 2 (two) times daily. 03/10/17   Regalado, Belkys A, MD  furosemide (LASIX) 40 MG tablet TAKE 1 TABLET BY MOUTH TWICE A DAY Patient taking differently: TAKE 40 MG BY MOUTH TWICE A DAY 09/09/17    Gollan, Kathlene November, MD  HYDROcodone-acetaminophen (NORCO) 5-325 MG tablet Take 1 tablet by mouth every 6 (six) hours as needed for moderate pain. 10/28/17   Curt Bears, MD  insulin glargine (LANTUS) 100 UNIT/ML injection Inject 0.1 mLs (10 Units total) into the skin daily. Patient taking differently: Inject 15 Units into the skin at bedtime.  03/11/17   Regalado, Jerald Kief A, MD  methylPREDNISolone (MEDROL DOSEPAK) 4 MG TBPK tablet Use as instructed 10/19/17   Curt Bears, MD  OVER THE COUNTER MEDICATION Take 1 capsule by mouth daily. Hemp Extract Supplement    [provider]  potassium chloride 20 MEQ TBCR Take 10 mEq by mouth daily. 10/19/17   Curt Bears, MD  prochlorperazine (COMPAZINE) 10 MG tablet Take 1 tablet (10 mg total) by mouth every 6 (six) hours as needed for nausea or vomiting. 10/28/17   Curt Bears, MD  rosuvastatin (CRESTOR) 40 MG tablet Take 40 mg by mouth daily. 08/20/17   [provider]  spironolactone (ALDACTONE) 25 MG tablet Take 1 tablet (25 mg total) by mouth daily. 03/11/17   Regalado, Belkys A, MD  zolpidem (AMBIEN) 10 MG tablet Take 10 mg by mouth at bedtime as needed for sleep.  01/26/15   [provider]                                                                                                                                    Past Surgical History Past Surgical History:  Procedure Laterality Date  . ABDOMINAL AORTAGRAM N/A 03/29/2012   Procedure: ABDOMINAL Maxcine Ham;  Surgeon: Angelia Mould, MD;  Location[facility] CATH LAB;  Service: Cardiovascular;  Laterality: N/A;  . CARDIOVERSION N/A 05/08/2017   Procedure: CARDIOVERSION;  Surgeon: Minna Merritts, MD;  Location: [facility] ORS;  Service: Cardiovascular;  Laterality: N/A;  . COLONOSCOPY    . ENDARTERECTOMY Right 05/09/2014   Procedure: RIGHT CAROTID ENDARTERECTOMY WITH PATCH ANGIOPLASTY;  Surgeon: Conrad , MD;  Location: [facility];  Service: Vascular;  Laterality: Right;    . ENDARTERECTOMY FEMORAL Right 05/17/2015   Procedure: ENDARTERECTOMY FEMORAL;  Surgeon: Angelia Mould, MD;  Location: [facility];  Service: Vascular;  Laterality: Right;  . ENDOBRONCHIAL ULTRASOUND Bilateral 09/08/2016   Procedure: ENDOBRONCHIAL ULTRASOUND;  Surgeon: Rigoberto Noel, MD;  Location: [facility] ENDOSCOPY;  Service: Cardiopulmonary;  Laterality: Bilateral;  . EUS N/A 10/08/2017   Procedure: UPPER ENDOSCOPIC ULTRASOUND (EUS) LINEAR;  Surgeon: [name], MD;  Location: [facility] ENDOSCOPY;  Service:  Endoscopy;  Laterality: N/A;  . FEMORAL-POPLITEAL BYPASS GRAFT Right 05/17/2015   Procedure: BYPASS GRAFT FEMORAL-BELOW KNEE POPLITEAL ARTERY, using gortex propaten graft 6 mm x 80 cm;  Surgeon: Angelia Mould, MD;  Location: [facility];  Service: Vascular;  Laterality: Right;  . IR GENERIC HISTORICAL  09/25/2016   IR FLUORO GUIDE PORT INSERTION RIGHT 09/25/2016 Markus Daft, MD [facility]-INTERV RAD  . IR GENERIC HISTORICAL  09/25/2016   IR US GUIDE VASC ACCESS RIGHT 09/25/2016 Markus Daft, MD [facility]-INTERV RAD  . KNEE ARTHROSCOPY Right 11/2009  . LOWER EXTREMITY ANGIOGRAM Bilateral 03/23/2015   Procedure: Lower Extremity Angiogram;  Surgeon: Angelia Mould, MD;  Location: [facility] CV LAB;  Service: Cardiovascular;  Laterality: Bilateral;  . MENISCUS REPAIR  11/2009  . PARTIAL KNEE ARTHROPLASTY Left 12/26/2014   Procedure: UNICOMPARTMENTAL KNEE;  Surgeon: Marchia Bond, MD;  Location: [facility];  Service: Orthopedics;  Laterality: Left;  . PATCH ANGIOPLASTY Right 05/17/2015   Procedure: VEIN PATCH ANGIOPLASTY ILEOFEMORAL ARTERY;  Surgeon: Angelia Mould, MD;  Location: [facility];  Service: Vascular;  Laterality: Right;  . PERCUTANEOUS STENT INTERVENTION N/A 05/10/2012   Procedure: PERCUTANEOUS STENT INTERVENTION;  Surgeon: Angelia Mould, MD;  Location: [facility] CATH LAB;  Service: Cardiovascular;  Laterality: N/A;  . PERIPHERAL VASCULAR CATHETERIZATION N/A 03/23/2015   Procedure: Abdominal Aortogram;   Surgeon: Angelia Mould, MD;  Location: [facility] CV LAB;  Service: Cardiovascular;  Laterality: N/A;  . STENTS     PLACED IN ??BOTH LEGS   2013?  . TEE WITHOUT CARDIOVERSION N/A 03/06/2017   Procedure: TRANSESOPHAGEAL ECHOCARDIOGRAM (TEE);  Surgeon: Larey Dresser, MD;  Location: [facility] ENDOSCOPY;  Service: Cardiovascular;  Laterality: N/A;  . TEE WITHOUT CARDIOVERSION N/A 05/08/2017   Procedure: TRANSESOPHAGEAL ECHOCARDIOGRAM (TEE);  Surgeon: Minna Merritts, MD;  Location: [facility] ORS;  Service: Cardiovascular;  Laterality: N/A;   Family History Family History  Problem Relation Age of Onset  . Diabetes Mother   . Hypertension Mother   . Heart disease Mother        Coronary Artery Bypass Graft  . Hyperlipidemia Mother   . Heart attack Mother   . Hypertension Father   . Hyperlipidemia Sister   . Stroke Sister     Social History Social History   Tobacco Use  . Smoking status: Former Smoker    Packs/day: 1.00    Years: 20.00    Pack years: 20.00    Types: Cigarettes  . Smokeless tobacco: Never Used  . Tobacco comment: quit smoking 2008  Substance Use Topics  . Alcohol use: Yes    Alcohol/week: 0.0 oz    Comment: beer- occasionally  . Drug use: No   Allergies Cialis [tadalafil]  Review of Systems Review of Systems All other systems are reviewed and are negative for acute change except as noted in the HPI  Physical Exam Vital Signs  I have reviewed the triage vital signs BP (!) 85/59 (BP Location: Left Arm)   Pulse 94   Resp 14   SpO2 96%   Physical Exam  Constitutional: He is oriented to person, place, and time. He appears well-developed and well-nourished. He has a sickly appearance. No distress.  HENT:  Head: Normocephalic and atraumatic.  Nose: Nose normal.  Eyes: Pupils are equal, round, and reactive to light. Conjunctivae and EOM are normal. Right eye exhibits no discharge. Left eye exhibits no discharge. No scleral icterus.  Neck: Normal range of  motion. Neck supple.  Cardiovascular: Normal rate  and regular rhythm. Exam reveals no gallop and no friction rub.  No murmur heard. Pulmonary/Chest: Effort normal and breath sounds normal. No stridor. No respiratory distress. He has no rales.  Abdominal: Soft. He exhibits no distension. There is no tenderness.  Musculoskeletal: He exhibits no edema or tenderness.  Neurological: He is alert and oriented to person, place, and time.  Skin: Skin is warm and dry. No rash noted. He is not diaphoretic. No erythema.  Psychiatric: He has a normal mood and affect.  Vitals reviewed.   ED Results and Treatments Labs (all labs ordered are listed, but only abnormal results are displayed) Labs Reviewed  CULTURE, BLOOD (ROUTINE X 2)  CULTURE, BLOOD (ROUTINE X 2)  URINALYSIS, ROUTINE W REFLEX MICROSCOPIC  I-STAT CG4 LACTIC ACID, ED  POC OCCULT BLOOD, ED  PREPARE RBC (CROSSMATCH)  TYPE AND SCREEN                                                                                                                         EKG  EKG Interpretation  Date/Time:    Ventricular Rate:    PR Interval:    QRS Duration:   QT Interval:    QTC Calculation:   R Axis:     Text Interpretation:        Radiology No results found. Pertinent labs & imaging results that were available during my care of the patient were reviewed by me and considered in my medical decision making (see chart for details).  Medications Ordered in ED Medications  heparin lock flush 100 unit/mL (has no administration in time range)  heparin lock flush 100 unit/mL (has no administration in time range)  0.9 %  sodium chloride infusion (has no administration in time range)  sodium chloride 0.9 % bolus 1,000 mL (1,000 mLs Intravenous New Bag/Given 11/09/17 1845)    And  sodium chloride 0.9 % bolus 1,000 mL (has no administration in time range)    And  sodium chloride 0.9 % bolus 250 mL (has no administration in time range)    piperacillin-tazobactam (ZOSYN) IVPB 3.375 g (has no administration in time range)  acetaminophen (TYLENOL) tablet 650 mg (650 mg Oral Given 11/09/17 1902)                                                                                                                                    Procedures Procedures  Emergency Focused Ultrasound Exam Limited Ultrasound Assessment for the evaluation of Hypotension ([facility] PROTOCOL)  Performed and interpreted by Dr. Leonette Monarch Indication: Hypotension Multiple images of the bilateral lungs, heart, inferior vena cava, abdomen, and abdominal aorta are obtained for the purposes of estimating presence/absence of pneumothorax, cardiac contractility, volume status, abdominal free fluid and aortic aneurysm with a multifrequency probe. Findings:  no anechoic fluid in abdomen, normal cardiac contractility, small amount of anechoic fluid surrounding heart, unable to visualize IVC,  Interpretation: no hemoperitoneum, + pericardial effusion w/o tamponade, likely depressed CVP,  Images archived electronically.  CPT Codes: cardiac [id number], abdomen [phone], limited retroperitoneal [phone] (study includes all codes)  CRITICAL CARE Performed by: Grayce Sessions Cardama Total critical care time: 35 minutes Critical care time was exclusive of separately billable procedures and treating other patients. Critical care was necessary to treat or prevent imminent or life-threatening deterioration. Critical care was time spent personally by me on the following activities: development of treatment plan with patient and/or surrogate as well as nursing, discussions with consultants, evaluation of patient's response to treatment, examination of patient, obtaining history from patient or surrogate, ordering and performing treatments and interventions, ordering and review of laboratory studies, ordering and review of radiographic studies, pulse oximetry and re-evaluation of patient's  condition.   (including critical care time)  Medical Decision Making / ED Course I have reviewed the nursing notes for this encounter and the patient's prior records (if available in EHR or on provided paperwork).    Patient is hypotensive.  Bedside ultrasound without evidence of tamponade.  Unable to visualize IVC.  Will obtain a formal echocardiogram for better visualization and characterization of pericardial effusion.    Given history of melena with evidence of anemia, concern for GI bleed.  This may also be due to recent chemotherapy as he is pancytopenic with neutropenia and thrombus cytopenia.    Given recent sick contacts, code sepsis was initiated and patient was started on empiric antibiotics.  IV fluids initiated.    Transfusion for platelets and packed red blood cells initiated in the emergency department.  Blood pressure is improving with intervention.  Case discussed with medicine who will admit the patient to stepdown unit for further workup and management.  Final Clinical Impression(s) / ED Diagnoses Final diagnoses:  Thrombocytopenia ([place])  Anemia due to antineoplastic chemotherapy      This chart was dictated using voice recognition software.  Despite best efforts to proofread,  errors can occur which can change the documentation meaning.   Fatima Blank, MD 11/09/17 1930

## 2017-11-09 NOTE — ED Notes (Signed)
Bed: HK74 Expected date:  Expected time:  Means of arrival:  Comments: Cancer center-low hgb

## 2017-11-09 NOTE — Progress Notes (Signed)
A consult was received from an ED physician for Zosyn per pharmacy dosing.  The patient's profile has been reviewed for ht/wt/allergies/indication/available labs.    A one time order has been placed for Zosyn 3.375g IV x 1 over 30 minutes.  Further antibiotics/pharmacy consults should be ordered by admitting physician if indicated.                       Thank you, Luiz Ochoa 11/09/2017  7:16 PM

## 2017-11-09 NOTE — H&P (Addendum)
History and Physical    Jesse Avila [id number]:[id number] DOB: 08-01-1951 DOA: 11/09/2017  Referring MD/NP/PA: Dr. Addison Lank PCP: [name], MD  Patient coming from: [place]  Chief Complaint: Fatigue  I have personally briefly reviewed patient's old medical records in [place]   HPI: Jesse Avila is a 67 y.o. 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 fib on Eliquis, and CAD; who presents with complaints of generalized fatigue and weakness over the last 2 weeks.  Patient reports being unable to move from one side of his house to the other due to his symptoms.  Associated symptoms include complaints of sore throat on the right-hand side, difficulty swallowing,  postnasal drainage, hiccups, nausea, and vomiting.  He has been significantly constipated and notes having to strain to have a bowel movement.  He used an enema which helped him have a dark bowel movement today, but denies any significant bleeding to his knowledge. Denies having any significant cough, shortness of breath, abdominal pain, or dysuria symptoms.  Patient was seen at the cancer clinic for his symptoms today.  He was found to have multiple abnormalities on lab work.  Orders were placed for Blood cultures, transfusion and 2 units of packed red blood cells, and 1 unit of platelets.  ED Course: Upon admission into the emergency department patient was noted to be afebrile, heart rates 92-104, respirations 14-17, blood pressure 85/59- 94/63, and O2 saturation maintained on room air.  Labs revealed WBC 0.9, hemoglobin 7.5, platelets 11, sodium 131, potassium 2.9, BUN 34, creatinine 2.09, and lactic acid 0.91.  Patient was given 2.25 L of normal saline IV fluids and empiric antibiotics of Zosyn.  Blood pressures were noted to be  improved after IV fluids.  TRH called to admit.    Review of Systems  Constitutional: Positive for chills and malaise/fatigue. Negative for fever.    HENT: Positive for sore throat. Negative for ear pain.        Positive for postnasal drip and hiccups  Eyes: Negative for photophobia and pain.  Respiratory: Negative for cough.   Cardiovascular: Negative for chest pain and leg swelling.  Gastrointestinal: Positive for constipation, nausea and vomiting.  Genitourinary: Negative for dysuria and frequency.  Musculoskeletal: Negative for falls.  Neurological: Negative for speech change, focal weakness and loss of consciousness.  Psychiatric/Behavioral: Negative for substance abuse and suicidal ideas.    Past Medical History:  Diagnosis Date  . Atrial fibrillation ([place])   . Carotid artery occlusion   . DJD (degenerative joint disease) of knee   . Essential hypertension, benign    takes Benicar daily  . Goals of care, counseling/discussion 09/18/2016  . History of colon polyps    benign  . History of gout   . History of radiation therapy 09/30/16-11/10/16   left lung 60 Gy in 30 fractions  . Hyperlipidemia    takes Fenofibrate and Crestor daily  . Insomnia    takes Ambien nightly as needed  . Joint pain   . Lung cancer ([place])   . Nocturia   . Peripheral vascular disease ([place])   . Primary localized osteoarthritis of left knee 12/26/2014  . Stroke [facility]) 2015?   takes Plavix daily as well as Pletal,not on plavix at present 05/15/15  . 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 ABDOMINAL AORTAGRAM N/A 03/29/2012   Procedure: ABDOMINAL Maxcine Ham;  Surgeon: Angelia Mould, MD;  Location: [facility] CATH LAB;  Service: Cardiovascular;  Laterality: N/A;  . CARDIOVERSION N/A 05/08/2017   Procedure: CARDIOVERSION;  Surgeon: Minna Merritts, MD;  Location: [facility] ORS;  Service: Cardiovascular;  Laterality: N/A;  . COLONOSCOPY    . ENDARTERECTOMY Right 05/09/2014   Procedure:  RIGHT CAROTID ENDARTERECTOMY WITH PATCH ANGIOPLASTY;  Surgeon: Conrad Robin [place], MD;  Location: [place];  Service: Vascular;  Laterality: Right;  . ENDARTERECTOMY FEMORAL Right 05/17/2015   Procedure: ENDARTERECTOMY FEMORAL;  Surgeon: Angelia Mould, MD;  Location: [place];  Service: Vascular;  Laterality: Right;  . ENDOBRONCHIAL ULTRASOUND Bilateral 09/08/2016   Procedure: ENDOBRONCHIAL ULTRASOUND;  Surgeon: Rigoberto Noel, MD;  Location: [facility] ENDOSCOPY;  Service: Cardiopulmonary;  Laterality: Bilateral;  . EUS N/A 10/08/2017   Procedure: UPPER ENDOSCOPIC ULTRASOUND (EUS) LINEAR;  Surgeon: [name], MD;  Location: [facility] ENDOSCOPY;  Service: Endoscopy;  Laterality: N/A;  . FEMORAL-POPLITEAL BYPASS GRAFT Right 05/17/2015   Procedure: BYPASS GRAFT FEMORAL-BELOW KNEE POPLITEAL ARTERY, using gortex propaten graft 6 mm x 80 cm;  Surgeon: Angelia Mould, MD;  Location: [place];  Service: Vascular;  Laterality: Right;  . IR GENERIC HISTORICAL  09/25/2016   IR FLUORO GUIDE PORT INSERTION RIGHT 09/25/2016 Markus Daft, MD [facility]-INTERV RAD  . IR GENERIC HISTORICAL  09/25/2016   IR US GUIDE VASC ACCESS RIGHT 09/25/2016 Markus Daft, MD [facility]INTER[facility]  . KNEE ARTHROSCOPY Right 11/2009  . LOWER EXTREMITY ANGIOGRAM Bilateral 03/23/2015   Procedure: Lower Extremity Angiogram;  Surgeon: Angelia Mould, MD;  Location: [place] CV LAB;  Service: Cardiovascular;  Laterality: Bilateral;  . MENISCUS REPAIR  11/2009  . PARTIAL KNEE ARTHROPLASTY Left 12/26/2014   Procedure: UNICOMPARTMENTAL KNEE;  Surgeon: Marchia Bond, MD;  Location: [place];  Service: Orthopedics;  Laterality: Left;  . PATCH ANGIOPLASTY Right 05/17/2015   Procedure: VEIN PATCH ANGIOPLASTY ILEOFEMORAL ARTERY;  Surgeon: Angelia Mould, MD;  Location: [place];  Service: Vascular;  Laterality: Right;  . PERCUTANEOUS STENT INTERVENTION N/A 05/10/2012   Procedure: PERCUTANEOUS STENT INTERVENTION;  Surgeon: Angelia Mould, MD;  Location: [facility] CATH LAB;   Service: Cardiovascular;  Laterality: N/A;  . PERIPHERAL VASCULAR CATHETERIZATION N/A 03/23/2015   Procedure: Abdominal Aortogram;  Surgeon: Angelia Mould, MD;  Location: [place] CV LAB;  Service: Cardiovascular;  Laterality: N/A;  . STENTS     PLACED IN ??BOTH LEGS   2013?  . TEE WITHOUT CARDIOVERSION N/A 03/06/2017   Procedure: TRANSESOPHAGEAL ECHOCARDIOGRAM (TEE);  Surgeon: Larey Dresser, MD;  Location: [facility] ENDOSCOPY;  Service: Cardiovascular;  Laterality: N/A;  . TEE WITHOUT CARDIOVERSION N/A 05/08/2017   Procedure: TRANSESOPHAGEAL ECHOCARDIOGRAM (TEE);  Surgeon: Minna Merritts, MD;  Location: [facility] ORS;  Service: Cardiovascular;  Laterality: N/A;     reports that he has quit smoking. His smoking use included cigarettes. He has a 20.00 pack-year smoking history. He has never used smokeless tobacco. He reports that he drinks alcohol. He reports that he does not use drugs.  Allergies  Allergen Reactions  . Cialis [Tadalafil] Other (See Comments)    Headache     Family History  Problem Relation Age of Onset  . Diabetes Mother   . Hypertension Mother   . Heart disease Mother        Coronary Artery Bypass Graft  . Hyperlipidemia Mother   . Heart attack Mother   . Hypertension  Father   . Hyperlipidemia Sister   . Stroke Sister     Prior to Admission medications   Medication Sig Start Date End Date Taking? Authorizing Provider  amiodarone (PACERONE) 200 MG tablet Take 1 tablet (200 mg total) by mouth daily. 11/06/17   Minna Merritts, MD  apixaban (ELIQUIS) 5 MG TABS tablet Take 1 tablet (5 mg total) by mouth 2 (two) times daily. 03/10/17   Regalado, Belkys A, MD  furosemide (LASIX) 40 MG tablet TAKE 1 TABLET BY MOUTH TWICE A DAY Patient taking differently: TAKE 40 MG BY MOUTH TWICE A DAY 09/09/17   Gollan, Kathlene November, MD  HYDROcodone-acetaminophen (NORCO) 5-325 MG tablet Take 1 tablet by mouth every 6 (six) hours as needed for moderate pain. 10/28/17   Curt Bears, MD    insulin glargine (LANTUS) 100 UNIT/ML injection Inject 0.1 mLs (10 Units total) into the skin daily. Patient taking differently: Inject 15 Units into the skin at bedtime.  03/11/17   Regalado, Jerald Kief A, MD  methylPREDNISolone (MEDROL DOSEPAK) 4 MG TBPK tablet Use as instructed 10/19/17   Curt Bears, MD  OVER THE COUNTER MEDICATION Take 1 capsule by mouth daily. Hemp Extract Supplement    [provider]  potassium chloride 20 MEQ TBCR Take 10 mEq by mouth daily. 10/19/17   Curt Bears, MD  prochlorperazine (COMPAZINE) 10 MG tablet Take 1 tablet (10 mg total) by mouth every 6 (six) hours as needed for nausea or vomiting. 10/28/17   Curt Bears, MD  rosuvastatin (CRESTOR) 40 MG tablet Take 40 mg by mouth daily. 08/20/17   [provider]  spironolactone (ALDACTONE) 25 MG tablet Take 1 tablet (25 mg total) by mouth daily. 03/11/17   Regalado, Belkys A, MD  zolpidem (AMBIEN) 10 MG tablet Take 10 mg by mouth at bedtime as needed for sleep.  01/26/15   [provider]    Physical Exam:  Constitutional: Chronically ill-appearing male NAD, calm, comfortable Vitals:   11/09/17 1756 11/09/17 1904  BP: (!) 85/59 94/63  Pulse: 94 92  Resp: 14   Temp:  98 F (36.7 C)  TempSrc:  Oral  SpO2: 96% 95%   Eyes: PERRL, lids and conjunctivae normal ENMT: Mucous membranes are moist. Posterior pharynx clear of any exudate or lesions.  Neck: normal, supple, no masses, no thyromegaly Respiratory: clear to auscultation bilaterally, no wheezing, no crackles. Normal respiratory effort. No accessory muscle use.  Cardiovascular: Regular rate and rhythm, no murmurs / rubs / gallops. No extremity edema. 2+ pedal pulses. No carotid bruits.  Port-A-Cath present of the right upper chest wall  Abdomen: no tenderness, no masses palpated. No hepatosplenomegaly. Bowel sounds positive.  Musculoskeletal: no clubbing / cyanosis. No joint deformity upper and lower extremities. Good ROM, no  contractures. Normal muscle tone.  Skin: no rashes, lesions, ulcers. No induration Neurologic: CN 2-12 grossly intact. Sensation intact, DTR normal. Strength 5/5 in all 4.  Psychiatric: Normal judgment and insight. Alert and oriented x 3. Normal mood.     Labs on Admission: I have personally reviewed following labs and imaging studies  CBC: Recent Labs  Lab 11/04/17 1305 11/09/17 1549  WBC 1.8* 0.9*  NEUTROABS 1.0* 0.3*  HCT 29.7* 21.8*  MCV 97.1 93.6  PLT 42* 11*   Basic Metabolic Panel: Recent Labs  Lab 11/04/17 1305 11/09/17 1549  NA 132* 131*  K 3.8 2.9*  CL 100 95*  CO2 24 25  GLUCOSE 194* 144*  BUN 25 34*  CREATININE 1.54*  2.09*  CALCIUM 9.4 9.6   GFR: Estimated Creatinine Clearance: 36.1 mL/min (A) (by C-G formula based on SCr of 2.09 mg/dL (H)). Liver Function Tests: Recent Labs  Lab 11/04/17 1305 11/09/17 1549  AST 11 12  ALT 16 17  ALKPHOS 78 78  BILITOT 0.7 0.8  PROT 6.3* 6.3*  ALBUMIN 3.2* 2.3*   No results for input(s): LIPASE, AMYLASE in the last 168 hours. No results for input(s): AMMONIA in the last 168 hours. Coagulation Profile: No results for input(s): INR, PROTIME in the last 168 hours. Cardiac Enzymes: No results for input(s): CKTOTAL, CKMB, CKMBINDEX, TROPONINI in the last 168 hours. BNP (last 3 results) No results for input(s): PROBNP in the last 8760 hours. HbA1C: No results for input(s): HGBA1C in the last 72 hours. CBG: No results for input(s): GLUCAP in the last 168 hours. Lipid Profile: No results for input(s): CHOL, HDL, LDLCALC, TRIG, CHOLHDL, LDLDIRECT in the last 72 hours. Thyroid Function Tests: No results for input(s): TSH, T4TOTAL, FREET4, T3FREE, THYROIDAB in the last 72 hours. Anemia Panel: No results for input(s): VITAMINB12, FOLATE, FERRITIN, TIBC, IRON, RETICCTPCT in the last 72 hours. Urine analysis:    Component Value Date/Time   COLORURINE AMBER (A) 06/01/2017 1416   APPEARANCEUR HAZY (A) 06/01/2017 1416     LABSPEC 1.017 06/01/2017 1416   PHURINE 5.0 06/01/2017 1416   GLUCOSEU NEGATIVE 06/01/2017 1416   [place] 06/01/2017 1416   [place] 06/01/2017 1416   KETONESUR NEGATIVE 06/01/2017 1416   PROTEINUR NEGATIVE 06/01/2017 1416   UROBILINOGEN 0.2 05/15/2015 0900   NITRITE NEGATIVE 06/01/2017 1416   LEUKOCYTESUR NEGATIVE 06/01/2017 1416   Sepsis Labs: Recent Results (from the past 240 hour(s))  Culture, Blood     Status: None (Preliminary result)   Collection Time: 11/09/17  4:35 PM  Result Value Ref Range Status   Specimen Description SITE NOT SPECIFIED  Final   Special Requests   Final    BOTTLES DRAWN AEROBIC AND ANAEROBIC Blood Culture adequate volume Performed at [facility], [place] [address] [place], [place] [address]    Culture PENDING  Incomplete   Report Status PENDING  Incomplete     Radiological Exams on Admission: No results found.  EKG: Independently reviewed.  Sinus rhythm at 78 bpm and QTC  Assessment/Plan Symptomatic anemia/possible GI bleed: Acute.  Patient presents with hemoglobin of 7.5 on admission previously noted to be 9.9 on 4/3.  Question possibility of GI bleed with reports of dark stools, but due to thrombocytopenia and neutropenia avoiding digital rectal exam at this time. - Admit to stepdown unit - Add on PT/INR, aPTT - Follow-up stool guaiac - Continue transfusion of 2 units of packed red blood cells - Protonix IV - Follow-up posttransfusion H&H - Consult GI in a.m. if needed patient previously seen by Dr. Owens Loffler   Hypotension: Blood pressures initially noted to be as low as 85/59.  Suspect secondary to dehydration and/or blood loss.  Patient given 2.25 L of normal saline IV fluids with improvement of blood pressures initially. - Continue to monitor and will give IV fluids as needed  Acute kidney injury on chronic kidney disease stage III: Patient presents with a creatinine of 2.09 with BUN 34 to suggest prerenal  cause of symptoms.  Baseline creatinine previously noted to be around 1.3-1.6. - IV fluids as tolerated - Recheck BMP in a.m.   Chemotherapy-induced neutropenia: Acute on chronic.  WBC noted to be 0.9 on admission. - Follow-up blood  cultures - Continue empiric antibiotics of Zosyn and de-escalate when medically appropriate  Thrombocytopenia: Acute on chronic.  Patient's platelet count had dropped to 11 previously 42.  Patient was ordered to be transfused 1 unit of platelets. - Follow-up repeat CBC in a.m.  Hypokalemia: Acute.  Initial potassium noted to be 2.9 on admission. - Check magnesium level - Give 50 mEq of potassium chloride IV - Continue to monitor and transfuse as needed  Paroxysmal atrial fibrillation on chronic anticoagulation  - Hold Eliquis - Continue amiodarone  History of pericardial effusion: Patient with no signs of cardiac tamponade. - Follow-up echocardiogram  Essential hypertension - Held furosemide and spironolactone as n.p.o.  Abnormal thyroid studies: TSH previously noted to be 0.29 on 11/04/17, and previously noted elevated free T4 back in 2018.  - May consider rechecking free T4 levels  Diabetes mellitus type 2: Last hemoglobin A1c noted to be 8.5 on 10/22/2016. - Hypoglycemic protocols - Hold Lantus - Continue to monitor and restart when medically appropriate  Small cell lung cancer with metastases: Patient currently receiving chemotherapy last treatment on 4/1. - Per oncology  DVT prophylaxis: SCDs  Code Status: Full Family Communication: No family present at bedside Disposition Plan: Likely discharge home once medically stable Consults called: None Admission status: Inpatient  Norval Morton MD [facility] Pager [phone]   If 7PM-7AM, please contact night-coverage [email or web address] Password [facility]  11/09/2017, 7:23 PM

## 2017-11-10 ENCOUNTER — Inpatient Hospital Stay (HOSPITAL_COMMUNITY): Payer: BLUE CROSS/BLUE SHIELD

## 2017-11-10 ENCOUNTER — Other Ambulatory Visit: Payer: Self-pay

## 2017-11-10 ENCOUNTER — Encounter (HOSPITAL_COMMUNITY): Payer: Self-pay

## 2017-11-10 DIAGNOSIS — E876 Hypokalemia: Secondary | ICD-10-CM | POA: Diagnosis present

## 2017-11-10 DIAGNOSIS — D701 Agranulocytosis secondary to cancer chemotherapy: Secondary | ICD-10-CM

## 2017-11-10 DIAGNOSIS — I959 Hypotension, unspecified: Secondary | ICD-10-CM

## 2017-11-10 DIAGNOSIS — T451X5A Adverse effect of antineoplastic and immunosuppressive drugs, initial encounter: Secondary | ICD-10-CM

## 2017-11-10 DIAGNOSIS — D6481 Anemia due to antineoplastic chemotherapy: Secondary | ICD-10-CM

## 2017-11-10 DIAGNOSIS — D696 Thrombocytopenia, unspecified: Secondary | ICD-10-CM | POA: Diagnosis present

## 2017-11-10 LAB — URINALYSIS, ROUTINE W REFLEX MICROSCOPIC
Bilirubin Urine: NEGATIVE
Glucose, UA: NEGATIVE mg/dL
Ketones, ur: NEGATIVE mg/dL
LEUKOCYTES UA: NEGATIVE
Nitrite: NEGATIVE
PROTEIN: 100 mg/dL — AB
Specific Gravity, Urine: 1.015 (ref 1.005–1.030)
pH: 5 (ref 5.0–8.0)

## 2017-11-10 LAB — GLUCOSE, CAPILLARY
GLUCOSE-CAPILLARY: 149 mg/dL — AB (ref 65–99)
Glucose-Capillary: 147 mg/dL — ABNORMAL HIGH (ref 65–99)

## 2017-11-10 LAB — BASIC METABOLIC PANEL
Anion gap: 4 — ABNORMAL LOW (ref 5–15)
BUN: 31 mg/dL — AB (ref 6–20)
CALCIUM: 8 mg/dL — AB (ref 8.9–10.3)
CO2: 20 mmol/L — ABNORMAL LOW (ref 22–32)
CREATININE: 1.83 mg/dL — AB (ref 0.61–1.24)
Chloride: 112 mmol/L — ABNORMAL HIGH (ref 101–111)
GFR calc Af Amer: 43 mL/min — ABNORMAL LOW (ref >60)
GFR, EST NON AFRICAN AMERICAN: 37 mL/min — AB (ref >60)
Glucose, Bld: 113 mg/dL — ABNORMAL HIGH (ref 65–99)
Potassium: 2.9 mmol/L — ABNORMAL LOW (ref 3.5–5.1)
Sodium: 136 mmol/L (ref 135–145)

## 2017-11-10 LAB — PREPARE RBC (CROSSMATCH)

## 2017-11-10 LAB — CBC WITH DIFFERENTIAL/PLATELET
BASOS ABS: 0 10*3/uL (ref 0.0–0.1)
Basophils Relative: 0 %
EOS ABS: 0 10*3/uL (ref 0.0–0.7)
Eosinophils Relative: 3 %
HCT: 19.7 % — ABNORMAL LOW (ref 39.0–52.0)
Hemoglobin: 6.8 g/dL — CL (ref 13.0–17.0)
LYMPHS ABS: 0.4 10*3/uL — AB (ref 0.7–4.0)
Lymphocytes Relative: 57 %
MCH: 30.4 pg (ref 26.0–34.0)
MCHC: 34.5 g/dL (ref 30.0–36.0)
MCV: 87.9 fL (ref 78.0–100.0)
Monocytes Absolute: 0 10*3/uL — ABNORMAL LOW (ref 0.1–1.0)
Monocytes Relative: 3 %
NEUTROS ABS: 0.2 10*3/uL — AB (ref 1.7–7.7)
Neutrophils Relative %: 37 %
Platelets: 27 10*3/uL — CL (ref 150–400)
RBC: 2.24 MIL/uL — ABNORMAL LOW (ref 4.22–5.81)
RDW: 15.9 % — AB (ref 11.5–15.5)
WBC: 0.6 10*3/uL — CL (ref 4.0–10.5)

## 2017-11-10 LAB — ECHOCARDIOGRAM LIMITED
HEIGHTINCHES: 73 in
WEIGHTICAEL: 2519 [oz_av]

## 2017-11-10 LAB — PROTIME-INR
INR: 2.29
Prothrombin Time: 25 seconds — ABNORMAL HIGH (ref 11.4–15.2)

## 2017-11-10 LAB — HEMOGLOBIN AND HEMATOCRIT, BLOOD
HEMATOCRIT: 24.1 % — AB (ref 39.0–52.0)
HEMOGLOBIN: 8.6 g/dL — AB (ref 13.0–17.0)

## 2017-11-10 LAB — MRSA PCR SCREENING: MRSA by PCR: NEGATIVE

## 2017-11-10 LAB — POTASSIUM: Potassium: 2.7 mmol/L — CL (ref 3.5–5.1)

## 2017-11-10 LAB — ABO/RH: ABO/RH(D): O POS

## 2017-11-10 MED ORDER — POTASSIUM CHLORIDE IN NACL 40-0.9 MEQ/L-% IV SOLN
INTRAVENOUS | Status: AC
  Administered 2017-11-10: 100 mL/h via INTRAVENOUS
  Filled 2017-11-10 (×2): qty 1000

## 2017-11-10 MED ORDER — HYDROCODONE-ACETAMINOPHEN 5-325 MG PO TABS
1.0000 | ORAL_TABLET | Freq: Four times a day (QID) | ORAL | Status: DC | PRN
  Administered 2017-11-10: 1 via ORAL
  Filled 2017-11-10: qty 1

## 2017-11-10 MED ORDER — HYDROCODONE-ACETAMINOPHEN 5-325 MG PO TABS
1.0000 | ORAL_TABLET | Freq: Four times a day (QID) | ORAL | Status: DC | PRN
  Administered 2017-11-10 – 2017-11-13 (×3): 1 via ORAL
  Filled 2017-11-10 (×3): qty 1

## 2017-11-10 MED ORDER — INSULIN ASPART 100 UNIT/ML ~~LOC~~ SOLN: 0.0000 [IU] | Freq: Every day | SUBCUTANEOUS | Status: DC

## 2017-11-10 MED ORDER — INSULIN ASPART 100 UNIT/ML ~~LOC~~ SOLN
0.0000 [IU] | Freq: Three times a day (TID) | SUBCUTANEOUS | Status: DC
  Administered 2017-11-10 – 2017-11-13 (×2): 1 [IU] via SUBCUTANEOUS

## 2017-11-10 MED ORDER — TRAMADOL HCL 50 MG PO TABS
50.0000 mg | ORAL_TABLET | Freq: Four times a day (QID) | ORAL | Status: DC | PRN
  Administered 2017-11-11: 50 mg via ORAL
  Filled 2017-11-10: qty 1

## 2017-11-10 MED ORDER — ZOLPIDEM TARTRATE 10 MG PO TABS
10.0000 mg | ORAL_TABLET | Freq: Every day | ORAL | Status: DC
  Administered 2017-11-10 – 2017-11-12 (×3): 10 mg via ORAL
  Filled 2017-11-10 (×3): qty 1

## 2017-11-10 MED ORDER — POTASSIUM CHLORIDE 10 MEQ/100ML IV SOLN
10.0000 meq | INTRAVENOUS | Status: AC
  Administered 2017-11-10: 10 meq via INTRAVENOUS
  Filled 2017-11-10: qty 100

## 2017-11-10 MED ORDER — MORPHINE SULFATE (PF) 4 MG/ML IV SOLN: 2.0000 mg | INTRAVENOUS | Status: DC | PRN

## 2017-11-10 MED ORDER — SODIUM CHLORIDE 0.9 % IV SOLN
Freq: Once | INTRAVENOUS | Status: AC
  Administered 2017-11-10: 13:00:00 via INTRAVENOUS

## 2017-11-10 MED ORDER — POTASSIUM CHLORIDE CRYS ER 20 MEQ PO TBCR
40.0000 meq | EXTENDED_RELEASE_TABLET | Freq: Once | ORAL | Status: AC
  Administered 2017-11-10: 40 meq via ORAL
  Filled 2017-11-10: qty 2

## 2017-11-10 MED ORDER — INSULIN GLARGINE 100 UNIT/ML ~~LOC~~ SOLN
10.0000 [IU] | Freq: Every day | SUBCUTANEOUS | Status: DC
  Administered 2017-11-10 – 2017-11-13 (×4): 10 [IU] via SUBCUTANEOUS
  Filled 2017-11-10 (×5): qty 0.1

## 2017-11-10 MED ORDER — POTASSIUM CHLORIDE CRYS ER 20 MEQ PO TBCR
40.0000 meq | EXTENDED_RELEASE_TABLET | Freq: Once | ORAL | Status: DC
  Filled 2017-11-10: qty 2

## 2017-11-10 NOTE — Progress Notes (Signed)
Spoke with covering MD (Dr. Myna Hidalgo) about blood transfusion orders. Okay to hold second unit since H/H is currently 8.6/24.1. Will recheck CBC in AM and follow up with blood transfusion orders based on those results. Will continue to monitor.

## 2017-11-10 NOTE — ED Notes (Signed)
CRITICAL VALUE STICKER  CRITICAL VALUE:WBC 0.6                               Hgb 6.8                                PLT 27  RECEIVER (on-site recipient of call): C. Robinette Esters, RN   DATE & TIME NOTIFIED: 11/10/2017  MESSENGER (representative from lab):  MD NOTIFIED: Dr. Karleen Hampshire  TIME OF NOTIFICATION: 1150  RESPONSE: Awaiting further instructions.

## 2017-11-10 NOTE — ED Notes (Signed)
Attempted to call report x1. Nurse will call back momentarily.

## 2017-11-10 NOTE — ED Notes (Signed)
Patient is tolerating blood transfusion well and does not report any symptoms of a blood transfusion reaction. Will continue to monitor.

## 2017-11-10 NOTE — Plan of Care (Signed)
  Problem: Bowel/Gastric: Goal: Will show no signs and symptoms of gastrointestinal bleeding Outcome: Progressing   Problem: Nutrition: Goal: Adequate nutrition will be maintained Outcome: Progressing   Problem: Fluid Volume: Goal: Will show no signs and symptoms of excessive bleeding Outcome: Progressing

## 2017-11-10 NOTE — ED Notes (Addendum)
Patient asleep and resting comfortably.

## 2017-11-10 NOTE — Progress Notes (Signed)
Patient ID: CANTRELL LAROUCHE, male   DOB: Oct 24, 1950, 67 y.o.   MRN: [id number]   Consult Received  Notes and labs reviewed   Pt had EUS with Dr Ardis Hughs on 10/08/17  With Bx of lesion next to uncinate of pancreas . 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  We will see in am  tomorrow  Thanks.

## 2017-11-10 NOTE — Progress Notes (Addendum)
CRITICAL VALUE ALERT  Critical Value: K+ 2.7  Date & Time Notied:  11/10/2017 @ 2232  Provider Notified: Dr. Myna Hidalgo paged through [facility] @ 2233  Orders Received/Actions taken: Awaiting orders at this time. Will continue to monitor.   *orders for 0.9%NS + 40KCl @ 135mL/hr and 40 mEq KCl tablets once received.

## 2017-11-10 NOTE — Progress Notes (Addendum)
PROGRESS NOTE    BERNARD SLAYDEN  RCV:[id number] DOB: [birth year] DOA: 11/09/2017 PCP: [name], MD   Brief Narrative:    Jesse Avila is a 67 y.o. 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 fib on Eliquis, and CAD; who presents with complaints of generalized fatigue and weakness over the last 2 weeks. He was found to be hypotensive, pancytopenic on arrival to ED. He received 2 units of prbc transfusion so far and 2 more units of prbc ordered to be transfused.    Assessment & Plan:   Principal Problem:   Symptomatic anemia Active Problems:   Small cell lung carcinoma, left (HCC)   Atrial fibrillation (HCC)   Malignant neoplasm of lung (HCC)   Acute kidney injury superimposed on chronic kidney disease (HCC)   Hypotension   Hypokalemia   Thrombocytopenia (HCC)   Chemotherapy induced neutropenia (HCC)   Fatigue and generalized weakness probably secondary to pancytopenia and specifically symptomatic anemia. Transfuse to keep hemoglobin greater than 7.  Repeat CBC in am with differential.    Metastatic small cell lung cancer with mets to liver, pancreas: - follow  Up with Dr Julien Nordmann as recommended.    Hypokalemia  Replaced. Repeat K tonight.  Mag level wnl.   Pancytopenia:  Secondary to chemotherapy. Pt was empirically started on zosyn for ANC of 200.  Monitor. If no fever and blood cultures are negative can d/c antibiotics in the next 24 hours.    Acute on Stage 3 CKD:  Probably sec to dehydration . Baseline creat is between 1.3 and 1.6.  Resume hydration and monitor renal parameters in am.    PAF: On eliquis at home for anti coagulation, which is being held for symptomatic anemia.  Rate controlled.   H/o pericardial effusion:  Echo does not show sig effusion today.   Hypertension:  Well controlled.   Dysphagia: Pt reports some difficulty  swallowing on the right side and he has some regurgitation. He was scheduled for barium swallow last week but couldn't get it done. He wants to know if barium swallow , can be done.  Will get SLP evaluation first .   Type 2 DM: CBG (last 3)  Recent Labs    11/10/17 1649  GLUCAP 149*   Resume SSI and restarted lantus as he was eating.    Questionable black stools With hemoglobin drop to 6.8 despite 2 units prbc transfusion.      DVT prophylaxis: scd's Code Status: full code.  Family Communication: wife and children at bedside.  Disposition Plan: when counts improve    Consultants:   GI,   Curb side with Dr Julien Nordmann.    Procedures: (none.    Antimicrobials: zosyn empirically    Subjective: No new complaints. Feeling tired.   Objective: Vitals:   11/10/17 0730 11/10/17 0800 11/10/17 0830 11/10/17 0900  BP: (!) 99/59 (!) 95/48 102/67 96/63  Pulse: 66 69 66 69  Resp: 19 (!) 23  17  Temp:      TempSrc:      SpO2: 93% 100% 98% 97%    Intake/Output Summary (Last 24 hours) at 11/10/2017 0933 Last data filed at 11/10/2017 1025 Gross per 24 hour  Intake 5111 ml  Output 450 ml  Net 4661 ml   There were no vitals filed for this visit.  Examination:  General exam: Appears calm and comfortable  Respiratory system: Clear to auscultation. Respiratory effort normal. Cardiovascular system: S1 & S2 heard, RRR. No JVD, murmurs,  No pedal edema. Gastrointestinal system: Abdomen is nondistended, soft and nontender. No organomegaly or masses felt. Normal bowel sounds heard. Central nervous system: Alert and oriented. No focal neurological deficits. Extremities: Symmetric 5 x 5 power. Skin: No rashes, lesions or ulcers Psychiatry: Judgement and insight appear normal. Mood & affect appropriate.     Data Reviewed: I have personally reviewed following labs and imaging studies  CBC: Recent Labs  Lab 11/04/17 1305 11/09/17 1549  WBC 1.8* 0.9*  NEUTROABS 1.0* 0.3*  HCT  29.7* 21.8*  MCV 97.1 93.6  PLT 42* 11*   Basic Metabolic Panel: Recent Labs  Lab 11/04/17 1305 11/09/17 1549 11/09/17 2100  NA 132* 131*  --   K 3.8 2.9*  --   CL 100 95*  --   CO2 24 25  --   GLUCOSE 194* 144*  --   BUN 25 34*  --   CREATININE 1.54* 2.09*  --   CALCIUM 9.4 9.6  --   MG  --   --  2.0   GFR: Estimated Creatinine Clearance: 36.1 mL/min (A) (by C-G formula based on SCr of 2.09 mg/dL (H)). Liver Function Tests: Recent Labs  Lab 11/04/17 1305 11/09/17 1549  AST 11 12  ALT 16 17  ALKPHOS 78 78  BILITOT 0.7 0.8  PROT 6.3* 6.3*  ALBUMIN 3.2* 2.3*   No results for input(s): LIPASE, AMYLASE in the last 168 hours. No results for input(s): AMMONIA in the last 168 hours. Coagulation Profile: Recent Labs  Lab 11/09/17 2100  INR 4.20*   Cardiac Enzymes: No results for input(s): CKTOTAL, CKMB, CKMBINDEX, TROPONINI in the last 168 hours. BNP (last 3 results) No results for input(s): PROBNP in the last 8760 hours. HbA1C: No results for input(s): HGBA1C in the last 72 hours. CBG: No results for input(s): GLUCAP in the last 168 hours. Lipid Profile: No results for input(s): CHOL, HDL, LDLCALC, TRIG, CHOLHDL, LDLDIRECT in the last 72 hours. Thyroid Function Tests: No results for input(s): TSH, T4TOTAL, FREET4, T3FREE, THYROIDAB in the last 72 hours. Anemia Panel: No results for input(s): VITAMINB12, FOLATE, FERRITIN, TIBC, IRON, RETICCTPCT in the last 72 hours. Sepsis Labs: Recent Labs  Lab 11/09/17 1929  LATICACIDVEN 0.91    Recent Results (from the past 240 hour(s))  Culture, Blood     Status: None (Preliminary result)   Collection Time: 11/09/17  4:35 PM  Result Value Ref Range Status   Specimen Description SITE NOT SPECIFIED  Final   Special Requests   Final    BOTTLES DRAWN AEROBIC AND ANAEROBIC Blood Culture adequate volume Performed at [facility], [address], [place], [place] [address]    Culture PENDING  Incomplete   Report  Status PENDING  Incomplete         Radiology Studies: No results found.      Scheduled Meds: . amiodarone  200 mg Oral Daily  . pantoprazole (PROTONIX) IV  40 mg Intravenous Q12H   Continuous Infusions: . sodium chloride 100 mL/hr at 11/10/17 0055  . chlorproMAZINE (THORAZINE) IV    . piperacillin-tazobactam (ZOSYN)  IV Stopped (11/10/17 0923)     LOS: 1 day    Time spent: 36 minutes.     Hosie Poisson, MD [facility] Pager [phone]  If 7PM-7AM, please contact night-coverage [email or web address] Password [facility] 11/10/2017, 9:33 AM

## 2017-11-10 NOTE — Progress Notes (Signed)
These preliminary result these preliminary results were noted.  Awaiting final report.

## 2017-11-10 NOTE — Progress Notes (Signed)
  Echocardiogram 2D Echocardiogram has been performed.  Jesse Avila 11/10/2017, 8:50 AM

## 2017-11-10 NOTE — ED Notes (Signed)
Echocardiogram at the bedside.

## 2017-11-11 ENCOUNTER — Other Ambulatory Visit: Payer: Medicare Other

## 2017-11-11 DIAGNOSIS — D61818 Other pancytopenia: Secondary | ICD-10-CM

## 2017-11-11 DIAGNOSIS — Z7901 Long term (current) use of anticoagulants: Secondary | ICD-10-CM

## 2017-11-11 DIAGNOSIS — C3492 Malignant neoplasm of unspecified part of left bronchus or lung: Secondary | ICD-10-CM

## 2017-11-11 DIAGNOSIS — K208 Other esophagitis: Secondary | ICD-10-CM

## 2017-11-11 LAB — CBC WITH DIFFERENTIAL/PLATELET
BASOS PCT: 0 %
Basophils Absolute: 0 10*3/uL (ref 0.0–0.1)
EOS ABS: 0 10*3/uL (ref 0.0–0.7)
EOS PCT: 2 %
HCT: 23.1 % — ABNORMAL LOW (ref 39.0–52.0)
HEMOGLOBIN: 8.3 g/dL — AB (ref 13.0–17.0)
Lymphocytes Relative: 55 %
Lymphs Abs: 0.4 10*3/uL — ABNORMAL LOW (ref 0.7–4.0)
MCH: 32 pg (ref 26.0–34.0)
MCHC: 35.9 g/dL (ref 30.0–36.0)
MCV: 89.2 fL (ref 78.0–100.0)
Monocytes Absolute: 0 10*3/uL — ABNORMAL LOW (ref 0.1–1.0)
Monocytes Relative: 5 %
NEUTROS ABS: 0.3 10*3/uL — AB (ref 1.7–7.7)
Neutrophils Relative %: 38 %
Platelets: 20 10*3/uL — CL (ref 150–400)
RBC: 2.59 MIL/uL — ABNORMAL LOW (ref 4.22–5.81)
RDW: 16.5 % — ABNORMAL HIGH (ref 11.5–15.5)
WBC: 0.7 10*3/uL — CL (ref 4.0–10.5)

## 2017-11-11 LAB — BASIC METABOLIC PANEL
Anion gap: 6 (ref 5–15)
BUN: 21 mg/dL — AB (ref 6–20)
CO2: 20 mmol/L — ABNORMAL LOW (ref 22–32)
CREATININE: 1.58 mg/dL — AB (ref 0.61–1.24)
Calcium: 7.9 mg/dL — ABNORMAL LOW (ref 8.9–10.3)
Chloride: 110 mmol/L (ref 101–111)
GFR calc Af Amer: 52 mL/min — ABNORMAL LOW (ref >60)
GFR calc non Af Amer: 45 mL/min — ABNORMAL LOW (ref >60)
GLUCOSE: 122 mg/dL — AB (ref 65–99)
POTASSIUM: 3.3 mmol/L — AB (ref 3.5–5.1)
SODIUM: 136 mmol/L (ref 135–145)

## 2017-11-11 LAB — GLUCOSE, CAPILLARY
GLUCOSE-CAPILLARY: 92 mg/dL (ref 65–99)
Glucose-Capillary: 86 mg/dL (ref 65–99)
Glucose-Capillary: 91 mg/dL (ref 65–99)
Glucose-Capillary: 117 mg/dL — ABNORMAL HIGH (ref 65–99)

## 2017-11-11 LAB — BPAM PLATELET PHERESIS
BLOOD PRODUCT EXPIRATION DATE: 201904102359
ISSUE DATE / TIME: 201904090400
UNIT TYPE AND RH: 5100

## 2017-11-11 LAB — MAGNESIUM: MAGNESIUM: 2.1 mg/dL (ref 1.7–2.4)

## 2017-11-11 LAB — PREPARE PLATELET PHERESIS: UNIT DIVISION: 0

## 2017-11-11 MED ORDER — SODIUM CHLORIDE 0.9 % IV BOLUS
500.0000 mL | Freq: Once | INTRAVENOUS | Status: AC
  Administered 2017-11-11: 500 mL via INTRAVENOUS

## 2017-11-11 MED ORDER — SODIUM CHLORIDE 0.9% FLUSH: 10.0000 mL | INTRAVENOUS | Status: DC | PRN

## 2017-11-11 MED ORDER — CHLORHEXIDINE GLUCONATE CLOTH 2 % EX PADS
6.0000 | MEDICATED_PAD | Freq: Every day | CUTANEOUS | Status: DC
  Administered 2017-11-11: 6 via TOPICAL

## 2017-11-11 MED ORDER — POTASSIUM CHLORIDE CRYS ER 20 MEQ PO TBCR
40.0000 meq | EXTENDED_RELEASE_TABLET | Freq: Two times a day (BID) | ORAL | Status: AC
  Administered 2017-11-11 (×2): 40 meq via ORAL
  Filled 2017-11-11 (×2): qty 2

## 2017-11-11 MED ORDER — GI COCKTAIL ~~LOC~~
30.0000 mL | Freq: Three times a day (TID) | ORAL | Status: DC
  Administered 2017-11-11: 30 mL via ORAL
  Filled 2017-11-11 (×3): qty 30

## 2017-11-11 MED ORDER — SUCRALFATE 1 GM/10ML PO SUSP
1.0000 g | Freq: Three times a day (TID) | ORAL | Status: DC
  Administered 2017-11-11 – 2017-11-13 (×8): 1 g via ORAL
  Filled 2017-11-11 (×9): qty 10

## 2017-11-11 MED ORDER — SODIUM CHLORIDE 0.9% FLUSH
10.0000 mL | Freq: Two times a day (BID) | INTRAVENOUS | Status: DC
  Administered 2017-11-11 – 2017-11-13 (×4): 10 mL

## 2017-11-11 NOTE — Evaluation (Signed)
Clinical/Bedside Swallow Evaluation Patient Details  Name: Jesse Avila MRN: [id number] Date of Birth: 11-26-1950  Today's Date: 11/11/2017 Time: SLP Start Time (ACUTE ONLY): 0844 SLP Stop Time (ACUTE ONLY): 0915 SLP Time Calculation (min) (ACUTE ONLY): 31 min  Past Medical History:  Past Medical History:  Diagnosis Date  . Atrial fibrillation ([place])   . Carotid artery occlusion   . DJD (degenerative joint disease) of knee   . Essential hypertension, benign    takes Benicar daily  . Goals of care, counseling/discussion 09/18/2016  . History of colon polyps    benign  . History of gout   . History of radiation therapy 09/30/16-11/10/16   left lung 60 Gy in 30 fractions  . Hyperlipidemia    takes Fenofibrate and Crestor daily  . Insomnia    takes Ambien nightly as needed  . Joint pain   . Lung cancer ([place])   . Nocturia   . Peripheral vascular disease ([place])   . Primary localized osteoarthritis of left knee 12/26/2014  . Stroke [facility]) 2015?   takes Plavix daily as well as Pletal,not on plavix at present 05/15/15  . 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 ABDOMINAL AORTAGRAM N/A 03/29/2012   Procedure: ABDOMINAL Maxcine Ham;  Surgeon: Angelia Mould, MD;  Location: [facility] CATH LAB;  Service: Cardiovascular;  Laterality: N/A;  . CARDIOVERSION N/A 05/08/2017   Procedure: CARDIOVERSION;  Surgeon: Minna Merritts, MD;  Location: [facility] ORS;  Service: Cardiovascular;  Laterality: N/A;  . COLONOSCOPY    . ENDARTERECTOMY Right 05/09/2014   Procedure: RIGHT CAROTID ENDARTERECTOMY WITH PATCH ANGIOPLASTY;  Surgeon: Conrad Velva, MD;  Location: [place];  Service: Vascular;  Laterality: Right;  . ENDARTERECTOMY FEMORAL Right 05/17/2015   Procedure: ENDARTERECTOMY FEMORAL;  Surgeon: Angelia Mould, MD;   Location: [place];  Service: Vascular;  Laterality: Right;  . ENDOBRONCHIAL ULTRASOUND Bilateral 09/08/2016   Procedure: ENDOBRONCHIAL ULTRASOUND;  Surgeon: Rigoberto Noel, MD;  Location: [facility] ENDOSCOPY;  Service: Cardiopulmonary;  Laterality: Bilateral;  . EUS N/A 10/08/2017   Procedure: UPPER ENDOSCOPIC ULTRASOUND (EUS) LINEAR;  Surgeon: [name], MD;  Location: [facility] ENDOSCOPY;  Service: Endoscopy;  Laterality: N/A;  . FEMORAL-POPLITEAL BYPASS GRAFT Right 05/17/2015   Procedure: BYPASS GRAFT FEMORAL-BELOW KNEE POPLITEAL ARTERY, using gortex propaten graft 6 mm x 80 cm;  Surgeon: Angelia Mould, MD;  Location: [place];  Service: Vascular;  Laterality: Right;  . IR GENERIC HISTORICAL  09/25/2016   IR FLUORO GUIDE PORT INSERTION RIGHT 09/25/2016 Markus Daft, MD [facility]-INTERV RAD  . IR GENERIC HISTORICAL  09/25/2016   IR US GUIDE VASC ACCESS RIGHT 09/25/2016 Markus Daft, MD [facility]INTER[facility]  . KNEE ARTHROSCOPY Right 11/2009  . LOWER EXTREMITY ANGIOGRAM Bilateral 03/23/2015   Procedure: Lower Extremity Angiogram;  Surgeon: Angelia Mould, MD;  Location: [place] CV LAB;  Service: Cardiovascular;  Laterality: Bilateral;  . MENISCUS REPAIR  11/2009  . PARTIAL KNEE ARTHROPLASTY Left 12/26/2014   Procedure: UNICOMPARTMENTAL KNEE;  Surgeon: Marchia Bond, MD;  Location: [place];  Service: Orthopedics;  Laterality: Left;  . PATCH ANGIOPLASTY Right 05/17/2015   Procedure: VEIN PATCH ANGIOPLASTY ILEOFEMORAL ARTERY;  Surgeon: Angelia Mould, MD;  Location: [place];  Service: Vascular;  Laterality: Right;  . PERCUTANEOUS STENT INTERVENTION N/A 05/10/2012   Procedure: PERCUTANEOUS STENT  INTERVENTION;  Surgeon: Angelia Mould, MD;  Location: [facility] CATH LAB;  Service: Cardiovascular;  Laterality: N/A;  . PERIPHERAL VASCULAR CATHETERIZATION N/A 03/23/2015   Procedure: Abdominal Aortogram;  Surgeon: Angelia Mould, MD;  Location: [place] CV LAB;  Service: Cardiovascular;  Laterality: N/A;  . STENTS      PLACED IN ??BOTH LEGS   2013?  . TEE WITHOUT CARDIOVERSION N/A 03/06/2017   Procedure: TRANSESOPHAGEAL ECHOCARDIOGRAM (TEE);  Surgeon: Larey Dresser, MD;  Location[facility] ENDOSCOPY;  Service: Cardiovascular;  Laterality: N/A;  . TEE WITHOUT CARDIOVERSION N/A 05/08/2017   Procedure: TRANSESOPHAGEAL ECHOCARDIOGRAM (TEE);  Surgeon: Minna Merritts, MD;  Location: [facility] ORS;  Service: Cardiovascular;  Laterality: N/A;   HPI:  67 yo male adm with symptomatic anemia - found to have low blood counts - pancytopenic, hypotensive and fatigue/weakness x3 weeks.  Pt referred for swallow evaluation due to his report of dysphagia x3 weeks = stable= resulting in sensation of liquids "going the wrong way" pointing to lower pharynx.  In addition, pt admits to sensation of "burning" in throat but also points to chest upon swallowing.  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 ,    Assessment / Plan / Recommendation Clinical Impression  Patient presents with functional oropharyngeal swallow ability.   Largest barrier to intake appears to be odynophagia = Given onsent occured post=chemo tx 3 weeks ago - suspect related and ? if could be esophagitis.  No focal CN deficits nor indications of aspiration with po observed. Suspect pharyngeal location of symptoms (to right side of throat) is referrant from distal esophagus.    Informed pt to compensation strategies to mitigate dysphagia symptoms including trying room temperature items, avoiding excessively cold, acidic, spicy or carbonated items.  Trials of cold vs warm drinks did not elicit significant difference with pain per pt - however carbonated beverages were not tolerated well.  Also advised pt to start meals with liquids.  Provided written precautions/tips to pt - OF note, GI PA arrived and advised pt to possibly  receive Carafate and lidocaine.     No SLP follow up indicated as pt with suspected primary esophageal deficits - that are acute and hopefully will abate.   Of note, pt remains interested in esophagram.  SLP Visit Diagnosis: Dysphagia, unspecified (R13.10)    Aspiration Risk  Mild aspiration risk    Diet Recommendation Regular;Thin liquid   Medication Administration: Other (Comment)(as tolerated) Compensations: Slow rate;Small sips/bites(start meals with liquids, drink liquids t/o meals, avoid acidic, spicy carbonated items) Postural Changes: Seated upright at 90 degrees    Other  Recommendations Oral Care Recommendations: Oral care BID   Follow up Recommendations None      Frequency and Duration      n/a      Prognosis    n/a    Swallow Study   General Date of Onset: 11/11/17 HPI: 67 yo male adm with symptomatic anemia - found to have low blood counts - pancytopenic, hypotensive and fatigue/weakness x3 weeks.  Pt referred for swallow evaluation due to his report of dysphagia x3 weeks = stable= resulting in sensation of liquids "going the wrong way" pointing to lower pharynx.  In addition, pt admits to sensation of "burning" in throat but also points to chest upon swallowing.  PMH + for CVAs in 2015 impacting right cerebellum,  frontal and basal ganglia regions, SCLC s/p radiation and chemo in 2018- recurrence and just finished first round of chemo approximately 3 weeks ago. ,  Type of Study: Bedside Swallow Evaluation Diet Prior to this Study: Regular;Thin liquids Temperature Spikes Noted: No Respiratory Status: Nasal cannula History of Recent Intubation: No Behavior/Cognition: Alert;Cooperative;Pleasant mood Oral Cavity Assessment: Within Functional Limits Oral Care Completed by SLP: No Oral Cavity - Dentition: Dentures, top;Other (Comment)(single lower tooth) Vision: Functional for self-feeding Self-Feeding Abilities: Able to feed self Patient Positioning: Upright in  bed Baseline Vocal Quality: Normal Volitional Cough: Strong Volitional Swallow: Able to elicit    Oral/Motor/Sensory Function Overall Oral Motor/Sensory Function: Within functional limits   Ice Chips Ice chips: Not tested   Thin Liquid Thin Liquid: Within functional limits Presentation: Cup;Self Fed;Straw Other Comments: multiple swallows    Nectar Thick Nectar Thick Liquid: Not tested   Honey Thick Honey Thick Liquid: Not tested   Puree Puree: Not tested   Solid   GO   Solid: Within functional limits Presentation: Self Aamir, [place] 11/11/2017,9:53 AM  Luanna Salk, [place] [facility] SLP [phone]

## 2017-11-11 NOTE — Consult Note (Addendum)
Consultation  Referring Provider: [facility] Karleen Hampshire MD Primary Care Physician:  [name], MD Primary Gastroenterologist:  Dr.Jacobs  Reason for Consultation:  GI bleeding/melena/anemia  HPI: Jesse Avila is a 67 y.o. male was admitted on 11/09/2017 after he had presented to the cancer center with complaints of progressive fatigue, poor appetite, vague dysphasia, hiccups and weakness over the past couple of weeks.  He had some complaints of dysphasia particularly with soreness on the right side of his throat.  He says he has been constipated, and had to take multiple doses of Ex-Lax and then an enema prior to admission, and after that started noticing dark stool.  There was no obvious blood no overt melena. Patient had initially been diagnosed with extensive small cell lung cancer ([id number]) in February 2018 with a large left upper lobe mass with mediastinal invasion and probable left iliac crest bone metastases.  He underwent a course of chemotherapy and radiation. On follow-up staging imaging in February 2019 was found to have a possible pancreatic mass CT of the chest showed a right suprahilar lesion measuring 4 x 3.1 cm, the esophagus appeared normal.  The sclerotic left iliac bone lesion was stable.  He has bilateral pleural plaques again noted suggesting prior asbestos exposure and asymmetric enlargement of the lateral segment of the left liver are stable underlying cirrhosis not excluded.  He is known to Dr. Ardis Hughs from endoscopic ultrasound done on 10/08/2017 after CT of the abdomen and pelvis done in February showed a new mass between the head of the pancreas and the transverse duodenum concerning for a nodal mass.  EUS showed this to be an irregular mass in the region of the uncinate 8 that did not clearly originate from the pancreas, measuring 4.5 cm.  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  At that time of EUS  the esophagus, stomach and duodenum appeared normal. .  Patient also has history of coronary artery disease, peripheral vascular disease for which she is status post lower extremity femoral-popliteal bypass and right carotid endarterectomy.  Also with atrial fibrillation, maintained on Eliquis. At the time of admission he was noted to be hypotensive and severely pancytopenic with WBC of 0.9, hemoglobin 7.5 hematocrit 21.8 and platelets of 11,000.  INR was 4.2, pro time 40. He has received platelet transfusions and blood transfusions, Eliquis has been on hold. Last evening INR 2.29 pro time 25.9, pending today This morning WBC 0.7, hemoglobin 8.3 hematocrit of 23.1 platelets 20,000 Rectal exam was not done on admission secondary to severe neutropenia, Hemoccults pending.  Today patient is complaining odynophagia.  He seems to be noticing this primarily with liquids and has a sensation on the right side of his esophagus/pharynx liquids may not be going down "the right tube".  He has been able to eat some solids without difficulty.  He says sometimes after swallowing he will get chest pain or "bad heartburn" over the past week or 2.  No complaints of abdominal pain.  He has not had any coffee-ground emesis.  He had hiccups at the time of admission which have since resolved.  He is not having any coughing or choking sensation with swallowing.  Patient has no pain or soreness in his mouth. Patient has not had a bowel movement today, he says he did have 2 looser stools yesterday.  GI pathogen panel was ordered and is pending as are Hemoccults.     Past Medical History:  Diagnosis Date  . Atrial fibrillation ([place])   . Carotid artery occlusion   . DJD (degenerative joint disease) of knee   . Essential hypertension, benign    takes Benicar daily  . Goals of care, counseling/discussion 09/18/2016  . History of colon polyps    benign  . History of gout   . History of radiation therapy 09/30/16-11/10/16    left lung 60 Gy in 30 fractions  . Hyperlipidemia    takes Fenofibrate and Crestor daily  . Insomnia    takes Ambien nightly as needed  . Joint pain   . Lung cancer ([place])   . Nocturia   . Peripheral vascular disease ([place])   . Primary localized osteoarthritis of left knee 12/26/2014  . Stroke [facility]) 2015?   takes Plavix daily as well as Pletal,not on plavix at present 05/15/15  . 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 ABDOMINAL AORTAGRAM N/A 03/29/2012   Procedure: ABDOMINAL Maxcine Ham;  Surgeon: Angelia Mould, MD;  Location[facility] CATH LAB;  Service: Cardiovascular;  Laterality: N/A;  . CARDIOVERSION N/A 05/08/2017   Procedure: CARDIOVERSION;  Surgeon: Minna Merritts, MD;  Location: [facility] ORS;  Service: Cardiovascular;  Laterality: N/A;  . COLONOSCOPY    . ENDARTERECTOMY Right 05/09/2014   Procedure: RIGHT CAROTID ENDARTERECTOMY WITH PATCH ANGIOPLASTY;  Surgeon: Conrad Monte[place], MD;  Location: [place];  Service: Vascular;  Laterality: Right;  . ENDARTERECTOMY FEMORAL Right 05/17/2015   Procedure: ENDARTERECTOMY FEMORAL;  Surgeon: Angelia Mould, MD;  Location: [place];  Service: Vascular;  Laterality: Right;  . ENDOBRONCHIAL ULTRASOUND Bilateral 09/08/2016   Procedure: ENDOBRONCHIAL ULTRASOUND;  Surgeon: Rigoberto Noel, MD;  Location: [facility] ENDOSCOPY;  Service: Cardiopulmonary;  Laterality: Bilateral;  . EUS N/A 10/08/2017   Procedure: UPPER ENDOSCOPIC ULTRASOUND (EUS) LINEAR;  Surgeon: [name], MD;  Location: [facility] ENDOSCOPY;  Service: Endoscopy;  Laterality: N/A;  . FEMORAL-POPLITEAL BYPASS GRAFT Right 05/17/2015   Procedure: BYPASS GRAFT FEMORAL-BELOW KNEE POPLITEAL ARTERY, using gortex propaten graft 6 mm x 80 cm;  Surgeon: Angelia Mould, MD;  Location: [place];  Service: Vascular;  Laterality: Right;  . IR  GENERIC HISTORICAL  09/25/2016   IR FLUORO GUIDE PORT INSERTION RIGHT 09/25/2016 Markus Daft, MD [facility]-INTER[facility]  . IR GENERIC HISTORICAL  09/25/2016   IR US GUIDE VASC ACCESS RIGHT 09/25/2016 Markus Daft, MD [facility]  . KNEE ARTHROSCOPY Right 11/2009  . LOWER EXTREMITY ANGIOGRAM Bilateral 03/23/2015   Procedure: Lower Extremity Angiogram;  Surgeon: Angelia Mould, MD;  Location: [place] CV LAB;  Service: Cardiovascular;  Laterality: Bilateral;  . MENISCUS REPAIR  11/2009  . PARTIAL KNEE ARTHROPLASTY Left 12/26/2014   Procedure: UNICOMPARTMENTAL KNEE;  Surgeon: Marchia Bond, MD;  Location[place];  Service: Orthopedics;  Laterality: Left;  . PATCH ANGIOPLASTY Right 05/17/2015   Procedure: VEIN PATCH ANGIOPLASTY ILEOFEMORAL ARTERY;  Surgeon: Angelia Mould, MD;  Location: [place];  Service: Vascular;  Laterality: Right;  . PERCUTANEOUS STENT INTERVENTION N/A 05/10/2012   Procedure: PERCUTANEOUS STENT INTERVENTION;  Surgeon: Angelia Mould, MD;  Location: [facility] CATH LAB;  Service: Cardiovascular;  Laterality: N/A;  . PERIPHERAL VASCULAR CATHETERIZATION N/A 03/23/2015   Procedure: Abdominal Aortogram;  Surgeon: Angelia Mould, MD;  Location: [place] CV LAB;  Service: Cardiovascular;  Laterality: N/A;  . STENTS  PLACED IN ??BOTH LEGS   2013?  . TEE WITHOUT CARDIOVERSION N/A 03/06/2017   Procedure: TRANSESOPHAGEAL ECHOCARDIOGRAM (TEE);  Surgeon: Larey Dresser, MD;  Location: [facility] ENDOSCOPY;  Service: Cardiovascular;  Laterality: N/A;  . TEE WITHOUT CARDIOVERSION N/A 05/08/2017   Procedure: TRANSESOPHAGEAL ECHOCARDIOGRAM (TEE);  Surgeon: Minna Merritts, MD;  Location: [facility] ORS;  Service: Cardiovascular;  Laterality: N/A;    Prior to Admission medications   Medication Sig Start Date End Date Taking? Authorizing Provider  amiodarone (PACERONE) 200 MG tablet Take 1 tablet (200 mg total) by mouth daily. 11/06/17  Yes Gollan, Kathlene November, MD  apixaban (ELIQUIS) 5 MG TABS tablet  Take 1 tablet (5 mg total) by mouth 2 (two) times daily. 03/10/17  Yes Regalado, Belkys A, MD  furosemide (LASIX) 40 MG tablet TAKE 1 TABLET BY MOUTH TWICE A DAY Patient taking differently: TAKE 40 MG BY MOUTH TWICE A DAY 09/09/17  Yes Gollan, Kathlene November, MD  insulin glargine (LANTUS) 100 UNIT/ML injection Inject 0.1 mLs (10 Units total) into the skin daily. Patient taking differently: Inject 15 Units into the skin at bedtime.  03/11/17  Yes Regalado, Belkys A, MD  potassium chloride 20 MEQ TBCR Take 10 mEq by mouth daily. 10/19/17  Yes Curt Bears, MD  prochlorperazine (COMPAZINE) 10 MG tablet Take 1 tablet (10 mg total) by mouth every 6 (six) hours as needed for nausea or vomiting. 10/28/17  Yes Curt Bears, MD  spironolactone (ALDACTONE) 25 MG tablet Take 1 tablet (25 mg total) by mouth daily. 03/11/17  Yes Regalado, Belkys A, MD  zolpidem (AMBIEN) 10 MG tablet Take 10 mg by mouth at bedtime.  01/26/15  Yes [provider]  HYDROcodone-acetaminophen (NORCO) 5-325 MG tablet Take 1 tablet by mouth every 6 (six) hours as needed for moderate pain. 10/28/17   Curt Bears, MD  methylPREDNISolone (MEDROL DOSEPAK) 4 MG TBPK tablet Use as instructed Patient not taking: Reported on 11/09/2017 10/19/17   Curt Bears, MD  OVER THE COUNTER MEDICATION Take 1 capsule by mouth daily. Hemp Extract Supplement    [provider]    Current Facility-Administered Medications  Medication Dose Route Frequency Provider Last Rate Last Dose  . acetaminophen (TYLENOL) tablet 650 mg  650 mg Oral Q6H PRN Norval Morton, MD       Or  . acetaminophen (TYLENOL) suppository 650 mg  650 mg Rectal Q6H PRN Smith, Rondell A, MD      . albuterol (PROVENTIL) (2.5 MG/3ML) 0.083% nebulizer solution 2.5 mg  2.5 mg Nebulization Q6H PRN Tamala Julian, Rondell A, MD      . amiodarone (PACERONE) tablet 200 mg  200 mg Oral Daily Smith, Rondell A, MD   200 mg at 11/10/17 1022  . Chlorhexidine Gluconate Cloth 2 % PADS 6  each  6 each Topical Daily Hosie Poisson, MD      . chlorproMAZINE (THORAZINE) 12.5 mg in sodium chloride 0.9 % 25 mL IVPB  12.5 mg Intravenous Q6H PRN Smith, Rondell A, MD      . heparin lock flush 100 unit/mL  250 Units Intracatheter PRN Fuller Plan A, MD      . HYDROcodone-acetaminophen (NORCO/VICODIN) 5-325 MG per tablet 1 tablet  1 tablet Oral Q6H PRN Hosie Poisson, MD   1 tablet at 11/10/17 2059  . insulin aspart (novoLOG) injection 0-5 Units  0-5 Units Subcutaneous QHS Hosie Poisson, MD      . insulin aspart (novoLOG) injection 0-9 Units  0-9 Units Subcutaneous TID WC Akula,  Jeoffrey Massed, MD   1 Units at 11/10/17 1717  . insulin glargine (LANTUS) injection 10 Units  10 Units Subcutaneous Daily Hosie Poisson, MD   10 Units at 11/10/17 1720  . morphine 4 MG/ML injection 2 mg  2 mg Intravenous Q3H PRN Smith, Rondell A, MD      . pantoprazole (PROTONIX) injection 40 mg  40 mg Intravenous Q12H Smith, Rondell A, MD   40 mg at 11/10/17 2200  . piperacillin-tazobactam (ZOSYN) IVPB 3.375 g  3.375 g Intravenous Q8H Luiz Ochoa, [place]   Stopped at 11/11/17 0750  . potassium chloride SA (K-DUR,KLOR-CON) CR tablet 40 mEq  40 mEq Oral Once Hosie Poisson, MD   Stopped at 11/10/17 1325  . potassium chloride SA (K-DUR,KLOR-CON) CR tablet 40 mEq  40 mEq Oral BID Hosie Poisson, MD      . prochlorperazine (COMPAZINE) injection 10 mg  10 mg Intravenous Q6H PRN Fuller Plan A, MD   10 mg at 11/09/17 2114  . sodium chloride flush (NS) 0.9 % injection 10-40 mL  10-40 mL Intracatheter Q12H Hosie Poisson, MD      . sodium chloride flush (NS) 0.9 % injection 10-40 mL  10-40 mL Intracatheter PRN Hosie Poisson, MD      . traMADol (ULTRAM) tablet 50 mg  50 mg Oral Q6H PRN Hosie Poisson, MD      . zolpidem (AMBIEN) tablet 10 mg  10 mg Oral QHS Hosie Poisson, MD   10 mg at 11/10/17 2200    Allergies as of 11/09/2017 - Review Complete 11/09/2017  Allergen Reaction Noted  . Cialis [tadalafil] Other (See Comments)  11/10/2011    Family History  Problem Relation Age of Onset  . Diabetes Mother   . Hypertension Mother   . Heart disease Mother        Coronary Artery Bypass Graft  . Hyperlipidemia Mother   . Heart attack Mother   . Hypertension Father   . Hyperlipidemia Sister   . Stroke Sister     Social History   Socioeconomic History  . Marital status: Married    Spouse name: Not on file  . Number of children: Not on file  . Years of education: Not on file  . Highest education level: Not on file  Occupational History  . Not on file  Social Needs  . Financial resource strain: Not on file  . Food insecurity:    Worry: Not on file    Inability: Not on file  . Transportation needs:    Medical: Not on file    Non-medical: Not on file  Tobacco Use  . Smoking status: Former Smoker    Packs/day: 1.00    Years: 20.00    Pack years: 20.00    Types: Cigarettes  . Smokeless tobacco: Never Used  . Tobacco comment: quit smoking 2008  Substance and Sexual Activity  . Alcohol use: Yes    Alcohol/week: 0.0 oz    Comment: beer- occasionally  . Drug use: No  . Sexual activity: Never  Lifestyle  . Physical activity:    Days per week: Not on file    Minutes per session: Not on file  . Stress: Not on file  Relationships  . Social connections:    Talks on phone: Not on file    Gets together: Not on file    Attends religious service: Not on file    Active member of club or organization: Not on file    Attends meetings of  clubs or organizations: Not on file    Relationship status: Not on file  . Intimate partner violence:    Fear of current or ex partner: Not on file    Emotionally abused: Not on file    Physically abused: Not on file    Forced sexual activity: Not on file  Other Topics Concern  . Not on file  Social History Narrative  . Not on file    Review of Systems: Pertinent positive and negative review of systems were noted in the above HPI section.  All other review of  systems was otherwise negative.  Physical Exam: Vital signs in last 24 hours: Temp:  [97.4 F (36.3 C)-98.1 F (36.7 C)] 98.1 F (36.7 C) (04/10 0800) Pulse Rate:  [62-74] 74 (04/10 0800) Resp:  [15-29] 21 (04/10 0800) BP: (83-124)/(46-79) 121/57 (04/10 0800) SpO2:  [94 %-100 %] 100 % (04/10 0800) Weight:  [167 lb 12.3 oz (76.1 kg)] 167 lb 12.3 oz (76.1 kg) (04/09 1901) Last BM Date: 11/10/17 General:   Alert,  Well-developed, well-nourished, somewhat chronically ill-appearing white male pleasant and cooperative in NAD Head:  Normocephalic and atraumatic. Eyes:  Sclera clear, no icterus.   Conjunctiva pink. Ears:  Normal auditory acuity. Nose:  No deformity, discharge,  or lesions. Mouth:  No deformity or lesions.   Neck:  Supple; no masses or thyromegaly.  Right carotid endarterectomy scar Lungs:  Clear throughout to auscultation.   No wheezes, crackles, or rhonchi. Heart:  Regular rate and rhythm; no murmurs, clicks, rubs,  or gallops. Abdomen:  Soft,nontender, BS active,nonpalp mass or hsm.   Rectal:  Deferred  Msk:  Symmetrical without gross deformities. . Pulses:  Normal pulses noted. Extremities:  Without clubbing or edema. Neurologic:  Alert and  oriented x4;  grossly normal neurologically. Skin:  Intact without significant lesions or rashes.. Psych:  Alert and cooperative. Normal mood and affect.  Intake/Output from previous day: 04/09 0701 - 04/10 0700 In: 1539 [P.O.:480; I.V.:565; Blood:344; IV Piggyback:150] Out: 650 [Urine:650] Intake/Output this shift: Total I/O In: 200 [I.V.:200] Out: -   Lab Results: Recent Labs    11/09/17 1549 11/10/17 1017 11/10/17 1806 11/11/17 0348  WBC 0.9* 0.6*  --  0.7*  HGB  --  6.8* 8.6* 8.3*  HCT 21.8* 19.7* 24.1* 23.1*  PLT 11* 27*  --  20*   BMET Recent Labs    11/09/17 1549 11/10/17 1017 11/10/17 2050 11/11/17 0348  NA 131* 136  --  136  K 2.9* 2.9* 2.7* 3.3*  CL 95* 112*  --  110  CO2 25 20*  --  20*    GLUCOSE 144* 113*  --  122*  BUN 34* 31*  --  21*  CREATININE 2.09* 1.83*  --  1.58*  CALCIUM 9.6 8.0*  --  7.9*   LFT Recent Labs    11/09/17 1549  PROT 6.3*  ALBUMIN 2.3*  AST 12  ALT 17  ALKPHOS 78  BILITOT 0.8   PT/INR Recent Labs    11/09/17 2100 11/10/17 1800  LABPROT 40.2* 25.0*  INR 4.20* 2.29       IMPRESSION:  #75 67 year old white male with history of metastatic small cell lung cancer initially diagnosed February 2018, treated with chemotherapy and radiation evidence of recurrence/progression with pancreatic mass noted on CT February 2019, and persistent/stable right suprahilar lesion. Endoscopic ultrasound in March 2009 proved to the pancreatic lesion to be a nodal mass consistent with small cell carcinoma. Patient has just initiated  combinationchemotherapy on 10/19/2017 Now admitted on 11/09/2017 with profound weakness, hypotension fatigue poor appetite ,dysphasia and profound pancytopenia  #2 pancytopenia persists-patient has required transfusions and platelet transfusions  #3 possible melena prior to admission-no current evidence of ongoing significant GI bleed History suspect she may now have an ulcerative esophagitis, and in setting of coagulopathy and severe pancytopenia certainly likely to be because of previous GI bleeding EUS done just prior to undergoing chemotherapy showed a normal esophagus stomach and duodenum  #4 dysphasia/odynophagia-suspect chemotherapy-induced esophagitis  #5 chronic anticoagulation-on Eliquis-currently on hold #6 peripheral vascular disease status post right carotid endarterectomy and and fem-pop bypass #7 atrial fibrillation  Plan; Full liquid to soft diet as tolerated Start GI cocktail 30 cc  30 min ac (with lidocaine) Start carafate  liquid 4 x daily  BID PPI  Elevate HOB EGD can be considered once his counts recover, however if he improves on the above regimen may not be necessary as would not change our  management.  Thank you ,we will follow   Amy [place]  11/11/2017, 8:44 AM   Attending physician's note   I have taken an interval history, reviewed the chart and examined the patient. I agree with the Advanced Practitioner's note, impression and recommendations.  Discussed with the patient and patient's family.  67 year old with metastatic small cell lung cancer with involvement of peripancreatic lymph nodes on chemotherapy.  Has chemo induced pancytopenia, esophagitis/mucositis. Patient on Eliquis due to atrial fibrillation on hold.  Currently, no GI bleeding.  Advance to soft diet as tolerated, continue Carafate, PPIs.  Could not tolerate GI cocktail.  If needed, can try viscous lidocaine by itself.  If he has further dysphagia, then would proceed with barium swallow.  I do feel he would get better over the next few days.  EGD only if barium swallow is significantly abnormal or if he has recurrent bleeding. Diarrhea resolved.  Carmell Austria, MD

## 2017-11-11 NOTE — Progress Notes (Signed)
Transferred to room 1525 via wheelchair. Report given to RN.

## 2017-11-11 NOTE — Progress Notes (Signed)
Notified Dr. Myna Hidalgo about decreased urine output and post void residual. Awaiting further orders.

## 2017-11-11 NOTE — Progress Notes (Signed)
Dr. Myna Hidalgo paged about low B/P. Pt asymptomatic at this time. Awaiting further orders.

## 2017-11-11 NOTE — Progress Notes (Signed)
Implanted port dsg dry and intact. No biopatch noted. Will added with needle change.

## 2017-11-11 NOTE — Progress Notes (Signed)
PROGRESS NOTE    Jesse Avila  VHQ:[id number] DOB: 1950-09-13 DOA: 11/09/2017 PCP: [name], MD   Brief Narrative:    Jesse Avila is a 67 y.o. 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 fib on Eliquis, and CAD; who presents with complaints of generalized fatigue and weakness over the last 2 weeks. He was found to be hypotensive, pancytopenic on arrival to ED. He received about 4 units or prbc transfusion so far. His hemoglobin is stable around 8.    Assessment & Plan:   Principal Problem:   Symptomatic anemia Active Problems:   Small cell lung carcinoma, left (HCC)   Atrial fibrillation (HCC)   Malignant neoplasm of lung (HCC)   Acute kidney injury superimposed on chronic kidney disease (HCC)   Hypotension   Hypokalemia   Thrombocytopenia (HCC)   Chemotherapy induced neutropenia (HCC)   Fatigue and generalized weakness probably secondary to pancytopenia and specifically symptomatic anemia. Transfuse to keep hemoglobin greater than 7.  Repeat CBC in am with differential shows improvement in [place],. Hemoglobin around 8 and platelets around 20,000.    Metastatic small cell lung cancer with mets to liver, pancreas: - follow  Up with Dr Julien Nordmann as recommended.    Hypokalemia  Replaced. Mag level wnl.   Pancytopenia:  Secondary to chemotherapy. Pt was empirically started on zosyn for ANC <500.  Monitor. If no fever and blood cultures are negative can d/c antibiotics in the next 24 hours.    Acute on Stage 3 CKD:  Probably sec to dehydration . Baseline creat is between 1.3 and 1.6.  Resume hydration and renal parameters show improvement to 1.58.   PAF: On eliquis at home for anti coagulation, which is being held for symptomatic anemia.  Rate controlled.   H/o pericardial effusion:  Echo does not show sig effusion today.   Hypertension:  Well  controlled.   Dysphagia: Pt reports some difficulty swallowing on the right side and he has some regurgitation. He was scheduled for barium swallow last week but couldn't get it done. He wants to know if barium swallow , can be done.  Will get SLP evaluation first .  xray of the neck and barium swallow in the next 24 hours.    Diarrhea: watery since 3 days.  Gi pathogen pcr ordered.    Type 2 DM: CBG (last 3)  Recent Labs    11/10/17 1649 11/10/17 2135 11/11/17 0727  GLUCAP 149* 147* 86   Resume SSI and restarted lantus.    Questionable black stools None today. Repeat H&H is around 8. Appreciate GI recommendations.      DVT prophylaxis: scd's Code Status: full code.  Family Communication: none.  at bedside.  Disposition Plan: hopefully home when his counts improve.    Consultants:   GI,   Curb side with Dr Julien Nordmann.    Procedures: (none.    Antimicrobials: zosyn empirically    Subjective: Diarrhea improved. No nausea or vomiting. Back pain also improved.   Objective: Vitals:   11/11/17 0319 11/11/17 0353 11/11/17 0400 11/11/17 0800  BP:  (!) 90/59 105/61 (!) 121/57  Pulse:  67 71 74  Resp:  (!) 23 (!) 23 (!) 21  Temp: 97.6 F (36.4 C)   98.1 F (36.7 C)  TempSrc: Axillary   Oral  SpO2:  95% 97% 100%  Weight:  Height:        Intake/Output Summary (Last 24 hours) at 11/11/2017 0922 Last data filed at 11/11/2017 0800 Gross per 24 hour  Intake 1739 ml  Output 650 ml  Net 1089 ml   Filed Weights   11/10/17 1901  Weight: 76.1 kg (167 lb 12.3 oz)    Examination:  General exam: Appears calm and comfortable not in distress.  Respiratory system: Clear to auscultation. Respiratory effort normal. No wheezing or rhonchi.  Cardiovascular system: S1 & S2 heard, RRR. No JVD, murmurs,  No pedal edema. Gastrointestinal system: Abdomen is soft non tender non distended bowel sounds heard.  Central nervous system: Alert and oriented. Non  focal Extremities: Symmetric 5 x 5 power. No cyanosis or clubbing.  Skin: No rashes, lesions or ulcers Psychiatry:  Mood & affect appropriate.     Data Reviewed: I have personally reviewed following labs and imaging studies  CBC: Recent Labs  Lab 11/04/17 1305 11/09/17 1549 11/10/17 1017 11/10/17 1806 11/11/17 0348  WBC 1.8* 0.9* 0.6*  --  0.7*  NEUTROABS 1.0* 0.3* 0.2*  --  0.3*  HGB  --   --  6.8* 8.6* 8.3*  HCT 29.7* 21.8* 19.7* 24.1* 23.1*  MCV 97.1 93.6 87.9  --  89.2  PLT 42* 11* 27*  --  20*   Basic Metabolic Panel: Recent Labs  Lab 11/04/17 1305 11/09/17 1549 11/09/17 2100 11/10/17 1017 11/10/17 2050 11/11/17 0348  NA 132* 131*  --  136  --  136  K 3.8 2.9*  --  2.9* 2.7* 3.3*  CL 100 95*  --  112*  --  110  CO2 24 25  --  20*  --  20*  GLUCOSE 194* 144*  --  113*  --  122*  BUN 25 34*  --  31*  --  21*  CREATININE 1.54* 2.09*  --  1.83*  --  1.58*  CALCIUM 9.4 9.6  --  8.0*  --  7.9*  MG  --   --  2.0  --   --  2.1   GFR: Estimated Creatinine Clearance: 50.8 mL/min (A) (by C-G formula based on SCr of 1.58 mg/dL (H)). Liver Function Tests: Recent Labs  Lab 11/04/17 1305 11/09/17 1549  AST 11 12  ALT 16 17  ALKPHOS 78 78  BILITOT 0.7 0.8  PROT 6.3* 6.3*  ALBUMIN 3.2* 2.3*   No results for input(s): LIPASE, AMYLASE in the last 168 hours. No results for input(s): AMMONIA in the last 168 hours. Coagulation Profile: Recent Labs  Lab 11/09/17 2100 11/10/17 1800  INR 4.20* 2.29   Cardiac Enzymes: No results for input(s): CKTOTAL, CKMB, CKMBINDEX, TROPONINI in the last 168 hours. BNP (last 3 results) No results for input(s): PROBNP in the last 8760 hours. HbA1C: No results for input(s): HGBA1C in the last 72 hours. CBG: Recent Labs  Lab 11/10/17 1649 11/10/17 2135 11/11/17 0727  GLUCAP 149* 147* 86   Lipid Profile: No results for input(s): CHOL, HDL, LDLCALC, TRIG, CHOLHDL, LDLDIRECT in the last 72 hours. Thyroid Function Tests: No  results for input(s): TSH, T4TOTAL, FREET4, T3FREE, THYROIDAB in the last 72 hours. Anemia Panel: No results for input(s): VITAMINB12, FOLATE, FERRITIN, TIBC, IRON, RETICCTPCT in the last 72 hours. Sepsis Labs: Recent Labs  Lab 11/09/17 1929  LATICACIDVEN 0.91    Recent Results (from the past 240 hour(s))  Culture, Blood     Status: None (Preliminary result)   Collection Time: 11/09/17  4:35 PM  Result Value Ref Range Status   Specimen Description SITE NOT SPECIFIED  Final   Special Requests   Final    BOTTLES DRAWN AEROBIC AND ANAEROBIC Blood Culture adequate volume   Culture   Final    NO GROWTH < 24 HOURS Performed at [facility], [address], [place], [place] [address]    Report Status PENDING  Incomplete  Culture, Blood     Status: None (Preliminary result)   Collection Time: 11/09/17  5:15 PM  Result Value Ref Range Status   Specimen Description   Final    BLOOD RIGHT PORTA CATH Performed at [facility] [address] [place], [place] [address]    Special Requests   Final    BOTTLES DRAWN AEROBIC AND ANAEROBIC Blood Culture adequate volume Performed at [facility] [address] [place], [place] [address]    Culture   Final    NO GROWTH < 24 HOURS Performed at [facility] [address] [place], [place] [address]    Report Status PENDING  Incomplete  MRSA PCR Screening     Status: None   Collection Time: 11/10/17  4:40 PM  Result Value Ref Range Status   MRSA by PCR NEGATIVE NEGATIVE Final    Comment:        The GeneXpert MRSA Assay (FDA approved for NASAL specimens only), is one component of a comprehensive MRSA colonization surveillance program. It is not intended to diagnose MRSA infection nor to guide or monitor treatment for MRSA infections. Performed at [facility], [place] [address], [place], [place] [address]          Radiology Studies: No results  found.      Scheduled Meds: . amiodarone  200 mg Oral Daily  . Chlorhexidine Gluconate Cloth  6 each Topical Daily  . insulin aspart  0-5 Units Subcutaneous QHS  . insulin aspart  0-9 Units Subcutaneous TID WC  . insulin glargine  10 Units Subcutaneous Daily  . pantoprazole (PROTONIX) IV  40 mg Intravenous Q12H  . potassium chloride  40 mEq Oral Once  . potassium chloride  40 mEq Oral BID  . sodium chloride flush  10-40 mL Intracatheter Q12H  . zolpidem  10 mg Oral QHS   Continuous Infusions: . chlorproMAZINE (THORAZINE) IV    . piperacillin-tazobactam (ZOSYN)  IV Stopped (11/11/17 0750)     LOS: 2 days    Time spent: 36 minutes.     Hosie Poisson, MD [facility] Pager [phone]  If 7PM-7AM, please contact night-coverage [email or web address] Password [email or web address] 11/11/2017, 9:22 AM

## 2017-11-12 ENCOUNTER — Ambulatory Visit: Payer: Medicare Other

## 2017-11-12 DIAGNOSIS — R1310 Dysphagia, unspecified: Secondary | ICD-10-CM

## 2017-11-12 DIAGNOSIS — D696 Thrombocytopenia, unspecified: Secondary | ICD-10-CM

## 2017-11-12 LAB — CBC WITH DIFFERENTIAL/PLATELET
Basophils Absolute: 0 10*3/uL (ref 0.0–0.1)
Basophils Relative: 0 %
EOS PCT: 1 %
Eosinophils Absolute: 0 10*3/uL (ref 0.0–0.7)
HEMATOCRIT: 25.3 % — AB (ref 39.0–52.0)
Hemoglobin: 8.8 g/dL — ABNORMAL LOW (ref 13.0–17.0)
LYMPHS ABS: 0.6 10*3/uL — AB (ref 0.7–4.0)
Lymphocytes Relative: 41 %
MCH: 31.5 pg (ref 26.0–34.0)
MCHC: 34.8 g/dL (ref 30.0–36.0)
MCV: 90.7 fL (ref 78.0–100.0)
MONOS PCT: 12 %
Monocytes Absolute: 0.2 10*3/uL (ref 0.1–1.0)
Neutro Abs: 0.6 10*3/uL — ABNORMAL LOW (ref 1.7–7.7)
Neutrophils Relative %: 46 %
Platelets: 18 10*3/uL — CL (ref 150–400)
RBC: 2.79 MIL/uL — ABNORMAL LOW (ref 4.22–5.81)
RDW: 16.5 % — AB (ref 11.5–15.5)
WBC: 1.4 10*3/uL — CL (ref 4.0–10.5)

## 2017-11-12 LAB — BASIC METABOLIC PANEL
Anion gap: 9 (ref 5–15)
BUN: 14 mg/dL (ref 6–20)
CO2: 20 mmol/L — AB (ref 22–32)
Calcium: 8.4 mg/dL — ABNORMAL LOW (ref 8.9–10.3)
Chloride: 108 mmol/L (ref 101–111)
Creatinine, Ser: 1.42 mg/dL — ABNORMAL HIGH (ref 0.61–1.24)
GFR calc Af Amer: 59 mL/min — ABNORMAL LOW (ref >60)
GFR, EST NON AFRICAN AMERICAN: 51 mL/min — AB (ref >60)
Glucose, Bld: 79 mg/dL (ref 65–99)
Potassium: 3.6 mmol/L (ref 3.5–5.1)
Sodium: 137 mmol/L (ref 135–145)

## 2017-11-12 LAB — GLUCOSE, CAPILLARY
GLUCOSE-CAPILLARY: 80 mg/dL (ref 65–99)
GLUCOSE-CAPILLARY: 95 mg/dL (ref 65–99)
GLUCOSE-CAPILLARY: 112 mg/dL — AB (ref 65–99)
GLUCOSE-CAPILLARY: 112 mg/dL — AB (ref 65–99)

## 2017-11-12 MED ORDER — LOPERAMIDE HCL 2 MG PO CAPS
2.0000 mg | ORAL_CAPSULE | ORAL | Status: DC | PRN
  Administered 2017-11-12: 2 mg via ORAL
  Filled 2017-11-12: qty 1

## 2017-11-12 MED ORDER — LIDOCAINE VISCOUS 2 % MT SOLN
15.0000 mL | Freq: Three times a day (TID) | OROMUCOSAL | Status: DC
  Administered 2017-11-12: 15 mL via OROMUCOSAL
  Filled 2017-11-12 (×6): qty 15

## 2017-11-12 NOTE — Care Management Important Message (Signed)
Important Message  Patient Details  Name: Jesse Avila MRN: [id number] Date of Birth: Apr 24, 1951   Medicare Important Message Given:  Yes    Kerin Salen 11/12/2017, 11:11 AMImportant Message  Patient Details  Name: Jesse Avila MRN: [id number] Date of Birth: Jun 11, 1951   Medicare Important Message Given:  Yes    Kerin Salen 11/12/2017, 11:11 AM

## 2017-11-12 NOTE — Care Management Important Message (Signed)
Important Message  Patient Details IM Letter given to Nora/Case Manager to present to the Patient Name: Jesse Avila MRN: [id number] Date of Birth: 1950-09-26   Medicare Important Message Given:  Yes    Kerin Salen 11/12/2017, 11:12 AMImportant Message  Patient Details  Name: Jesse Avila MRN: [id number] Date of Birth: 15-Jun-1951   Medicare Important Message Given:  Yes    Kerin Salen 11/12/2017, 11:12 AM

## 2017-11-12 NOTE — Care Management Important Message (Signed)
Important Message  Patient Details  Name: Jesse Avila MRN: [id number] Date of Birth: [birth year]   Medicare Important Message Given:  Yes    Kerin Salen 11/12/2017, 11:12 AMImportant Message  Patient Details  Name: Jesse Avila MRN: [id number] Date of Birth: [birth year]   Medicare Important Message Given:  Yes    Kerin Salen 11/12/2017, 11:12 AM

## 2017-11-12 NOTE — Progress Notes (Signed)
PROGRESS NOTE    Jesse Avila  XNA:[id number] DOB: Feb 01, 1951 DOA: 11/09/2017 PCP: [name], MD   Brief Narrative:    Jesse Avila is a 67 y.o. 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 fib on Eliquis, and CAD; who presents with complaints of generalized fatigue and weakness over the last 2 weeks. He was found to be hypotensive, pancytopenic on arrival to ED. He received about 4 units or prbc transfusion so far. His hemoglobin is stable around 8.    Assessment & Plan:   Principal Problem:   Symptomatic anemia Active Problems:   Small cell lung carcinoma, left (HCC)   Atrial fibrillation (HCC)   Malignant neoplasm of lung (HCC)   Acute kidney injury superimposed on chronic kidney disease (HCC)   Hypotension   Hypokalemia   Thrombocytopenia (HCC)   Chemotherapy induced neutropenia (HCC)   Fatigue and generalized weakness probably secondary to pancytopenia and specifically symptomatic anemia. Transfuse to keep hemoglobin greater than 7.  Repeat CBC in am with differential shows improvement in [place],. Hemoglobin around 8 and platelets around 18,000   Metastatic small cell lung cancer with mets to liver, pancreas: - follow  Up with Dr Julien Nordmann as recommended.    Hypokalemia  Replaced. Mag level wnl.   Pancytopenia:  Secondary to chemotherapy. Pt was empirically started on zosyn for ANC <500. His ANC improved, cultures have been negative, plan to d.c to zosyn.     Acute on Stage 3 CKD:  Probably sec to dehydration . Baseline creat is between 1.3 and 1.6.  Resume hydration and renal parameters show improvement to 1.4.   PAF: On eliquis at home for anti coagulation, which is being held for symptomatic anemia.  Rate controlled.   H/o pericardial effusion:  Echo does not show sig effusion today.   Hypertension:  Well controlled.   Dysphagia/  odynophagia: Pt reports some difficulty swallowing on the right side and he has some regurgitation. He was scheduled for barium swallow last week but couldn't get it done. He wants to know if barium swallow , can be done. He also reports some pain when swallowing. Possibly from esophagitis from chemo?  Will get SLP evaluation first . GI on board, EGD couldn't be done due to low counts. Barium swallow ordered.   Diarrhea:resolved.  D/c enteric precautions.    Type 2 DM: CBG (last 3)  Recent Labs    11/11/17 2127 11/12/17 0754 11/12/17 1159  GLUCAP 92 80 95   Resume SSI and restarted lantus.    Questionable black stools None today. Repeat H&H is around 8. Appreciate GI recommendations.      DVT prophylaxis: scd's Code Status: full code.  Family Communication: none.  at bedside.  Disposition Plan: hopefully home when his counts improve.    Consultants:   GI,   Curb side with Dr Julien Nordmann.    Procedures: (none.    Antimicrobials: zosyn empirically    Subjective: No bm today.reports feeling better. .   Objective: Vitals:   11/11/17 1425 11/11/17 2131 11/12/17 0511 11/12/17 1400  BP: 108/67 100/61 113/68 125/77  Pulse: 68 66 71 72  Resp: 18 20 20 18   Temp: (!) 97.5 F (36.4 C) 98 F (36.7 C) 97.7 F (36.5 C) (!) 97.4 F (36.3 C)  TempSrc: Oral Oral Oral Oral  SpO2:  100% 98% 100%  Weight:  Height:        Intake/Output Summary (Last 24 hours) at 11/12/2017 1455 Last data filed at 11/12/2017 1400 Gross per 24 hour  Intake 360 ml  Output 2450 ml  Net -2090 ml   Filed Weights   11/10/17 1901  Weight: 76.1 kg (167 lb 12.3 oz)    Examination:  General exam: Appears comfortable.  Respiratory system: air entry fair, no wheezing or rhonchi.  Cardiovascular system: S1 & S2 heard, RRR. No JVD, murmurs,  No pedal edema. Gastrointestinal system: Abdomen is soft NT ND BS+ Central nervous system: Alert and oriented. Non focal Extremities: Symmetric 5 x  5 power. No cyanosis or clubbing.  Skin: No rashes, lesions or ulcers Psychiatry:  Mood & affect appropriate.     Data Reviewed: I have personally reviewed following labs and imaging studies  CBC: Recent Labs  Lab 11/09/17 1549 11/10/17 1017 11/10/17 1806 11/11/17 0348 11/12/17 0500  WBC 0.9* 0.6*  --  0.7* 1.4*  NEUTROABS 0.3* 0.2*  --  0.3* 0.6*  HGB  --  6.8* 8.6* 8.3* 8.8*  HCT 21.8* 19.7* 24.1* 23.1* 25.3*  MCV 93.6 87.9  --  89.2 90.7  PLT 11* 27*  --  20* 18*   Basic Metabolic Panel: Recent Labs  Lab 11/09/17 1549 11/09/17 2100 11/10/17 1017 11/10/17 2050 11/11/17 0348 11/12/17 0500  NA 131*  --  136  --  136 137  K 2.9*  --  2.9* 2.7* 3.3* 3.6  CL 95*  --  112*  --  110 108  CO2 25  --  20*  --  20* 20*  GLUCOSE 144*  --  113*  --  122* 79  BUN 34*  --  31*  --  21* 14  CREATININE 2.09*  --  1.83*  --  1.58* 1.42*  CALCIUM 9.6  --  8.0*  --  7.9* 8.4*  MG  --  2.0  --   --  2.1  --    GFR: Estimated Creatinine Clearance: 56.6 mL/min (A) (by C-G formula based on SCr of 1.42 mg/dL (H)). Liver Function Tests: Recent Labs  Lab 11/09/17 1549  AST 12  ALT 17  ALKPHOS 78  BILITOT 0.8  PROT 6.3*  ALBUMIN 2.3*   No results for input(s): LIPASE, AMYLASE in the last 168 hours. No results for input(s): AMMONIA in the last 168 hours. Coagulation Profile: Recent Labs  Lab 11/09/17 2100 11/10/17 1800  INR 4.20* 2.29   Cardiac Enzymes: No results for input(s): CKTOTAL, CKMB, CKMBINDEX, TROPONINI in the last 168 hours. BNP (last 3 results) No results for input(s): PROBNP in the last 8760 hours. HbA1C: No results for input(s): HGBA1C in the last 72 hours. CBG: Recent Labs  Lab 11/11/17 1107 11/11/17 1717 11/11/17 2127 11/12/17 0754 11/12/17 1159  GLUCAP 91 117* 92 80 95   Lipid Profile: No results for input(s): CHOL, HDL, LDLCALC, TRIG, CHOLHDL, LDLDIRECT in the last 72 hours. Thyroid Function Tests: No results for input(s): TSH, T4TOTAL,  FREET4, T3FREE, THYROIDAB in the last 72 hours. Anemia Panel: No results for input(s): VITAMINB12, FOLATE, FERRITIN, TIBC, IRON, RETICCTPCT in the last 72 hours. Sepsis Labs: Recent Labs  Lab 11/09/17 1929  LATICACIDVEN 0.91    Recent Results (from the past 240 hour(s))  Culture, Blood     Status: None (Preliminary result)   Collection Time: 11/09/17  4:35 PM  Result Value Ref Range Status   Specimen Description SITE NOT SPECIFIED  Final  Special Requests   Final    BOTTLES DRAWN AEROBIC AND ANAEROBIC Blood Culture adequate volume   Culture   Final    NO GROWTH 3 DAYS Performed at [facility], [place] [address], [place], [place] [address]    Report Status PENDING  Incomplete  Culture, Blood     Status: None (Preliminary result)   Collection Time: 11/09/17  5:15 PM  Result Value Ref Range Status   Specimen Description   Final    BLOOD RIGHT PORTA CATH Performed at [facility] [address], [place], [place] [address]    Special Requests   Final    BOTTLES DRAWN AEROBIC AND ANAEROBIC Blood Culture adequate volume Performed at [facility] [address] [place], [place] [address]    Culture   Final    NO GROWTH 3 DAYS Performed at [facility], [place] [address] [place], [place] [address]    Report Status PENDING  Incomplete  MRSA PCR Screening     Status: None   Collection Time: 11/10/17  4:40 PM  Result Value Ref Range Status   MRSA by PCR NEGATIVE NEGATIVE Final    Comment:        The GeneXpert MRSA Assay (FDA approved for NASAL specimens only), is one component of a comprehensive MRSA colonization surveillance program. It is not intended to diagnose MRSA infection nor to guide or monitor treatment for MRSA infections. Performed at [facility], [place] [address], [place], [place] [address]          Radiology Studies: No results found.      Scheduled Meds: . amiodarone  200 mg Oral  Daily  . Chlorhexidine Gluconate Cloth  6 each Topical Daily  . insulin aspart  0-5 Units Subcutaneous QHS  . insulin aspart  0-9 Units Subcutaneous TID WC  . insulin glargine  10 Units Subcutaneous Daily  . lidocaine  15 mL Mouth/Throat TID AC  . pantoprazole (PROTONIX) IV  40 mg Intravenous Q12H  . potassium chloride  40 mEq Oral Once  . sodium chloride flush  10-40 mL Intracatheter Q12H  . sucralfate  1 g Oral TID PC & HS  . zolpidem  10 mg Oral QHS   Continuous Infusions: . chlorproMAZINE (THORAZINE) IV       LOS: 3 days    Time spent: 30 minutes.     Hosie Poisson, MD [facility] Pager [phone]  If 7PM-7AM, please contact night-coverage [email or web address] Password [email or web address] 11/12/2017, 2:55 PM

## 2017-11-12 NOTE — Progress Notes (Signed)
These preliminary result these preliminary results were noted.  Awaiting final report.

## 2017-11-12 NOTE — Progress Notes (Addendum)
Patient ID: Jesse Avila, male   DOB: Nov 06, 1950, 67 y.o.   MRN: [id number]    Progress Note   Subjective   Feeling a bit stronger-No c/o abdominal pain, trying to eat small amts No further diarrhea , and stool studies have not been done as no diarrhea Still painful to swallow - GI cocktail made him vomit? Yesterday- so not taking   Objective   Vital signs in last 24 hours: Temp:  [97.5 F (36.4 C)-98 F (36.7 C)] 97.7 F (36.5 C) (04/11 0511) Pulse Rate:  [64-71] 71 (04/11 0511) Resp:  [18-20] 20 (04/11 0511) BP: (100-113)/(59-68) 113/68 (04/11 0511) SpO2:  [98 %-100 %] 98 % (04/11 0511) Last BM Date: 11/10/17 General:    white male  in NAD Heart:  Regular rate and rhythm; no murmurs Lungs: Respirations even and unlabored, lungs CTA bilaterally Abdomen:  Soft, nontender and nondistended. Normal bowel sounds. Extremities:  Without edema. Neurologic:  Alert and oriented,  grossly normal neurologically. Psych:  Cooperative. Normal mood and affect.  Intake/Output from previous day: 04/10 0701 - 04/11 0700 In: 970 [P.O.:720; I.V.:200; IV Piggyback:50] Out: 1750 [Urine:1750] Intake/Output this shift: No intake/output data recorded.  Lab Results: Recent Labs    11/10/17 1017 11/10/17 1806 11/11/17 0348 11/12/17 0500  WBC 0.6*  --  0.7* 1.4*  HGB 6.8* 8.6* 8.3* 8.8*  HCT 19.7* 24.1* 23.1* 25.3*  PLT 27*  --  20* 18*   BMET Recent Labs    11/10/17 1017 11/10/17 2050 11/11/17 0348 11/12/17 0500  NA 136  --  136 137  K 2.9* 2.7* 3.3* 3.6  CL 112*  --  110 108  CO2 20*  --  20* 20*  GLUCOSE 113*  --  122* 79  BUN 31*  --  21* 14  CREATININE 1.83*  --  1.58* 1.42*  CALCIUM 8.0*  --  7.9* 8.4*   LFT Recent Labs    11/09/17 1549  PROT 6.3*  ALBUMIN 2.3*  AST 12  ALT 17  ALKPHOS 78  BILITOT 0.8   PT/INR Recent Labs    11/09/17 2100 11/10/17 1800  LABPROT 40.2* 25.0*  INR 4.20* 2.29      Assessment / Plan:   #1 67 yo male with metastatic  small  cell lung CA  Admitted with profound weakness, hypotension,, poor oral intake  After chemo initiation 10/19/17  Severe pancytopenia, and coagulopathy on admit Possible melena prior to admit in setting of chronic anticoagulation  No overt Bleeding since admit, coagulopathy corrected ,and counts are finally showing trend toward improvement.  #2 Odynophagia - consistent with chemo  induced esophagitis  #3 PVD #4 Afib  Plan; Soft diet as tolerates BID PPI Carafate susp 4 x daily Will try viscous lidocaine 5 cc ac  Pt very concerned and would like further evaluation of esophagus etc- will order barium swallow for tomorrow DC enteric precautions    Contact  Amy [place], P.A.-C               [phone]   Attending physician's note   I have taken an interval history, reviewed the chart and examined the patient. I agree with the Advanced Practitioner's note, impression and recommendations.   Plan to proceed with a barium swallow tomorrow morning.  If barium swallow is abnormal, then will consider EGD with possible esophageal biopsies and esophageal dilatation.  Continue PPIs, Carafate and viscous lidocaine.  He could not tolerate GI cocktail.  Carmell Austria, MD

## 2017-11-13 ENCOUNTER — Inpatient Hospital Stay (HOSPITAL_COMMUNITY): Payer: BLUE CROSS/BLUE SHIELD

## 2017-11-13 ENCOUNTER — Other Ambulatory Visit: Payer: Self-pay | Admitting: Internal Medicine

## 2017-11-13 ENCOUNTER — Ambulatory Visit: Payer: Medicare Other

## 2017-11-13 DIAGNOSIS — R531 Weakness: Secondary | ICD-10-CM

## 2017-11-13 DIAGNOSIS — C3401 Malignant neoplasm of right main bronchus: Secondary | ICD-10-CM

## 2017-11-13 LAB — CBC WITH DIFFERENTIAL/PLATELET
BASOS ABS: 0 10*3/uL (ref 0.0–0.1)
Basophils Relative: 0 %
EOS ABS: 0 10*3/uL (ref 0.0–0.7)
Eosinophils Relative: 1 %
HEMATOCRIT: 29.7 % — AB (ref 39.0–52.0)
Hemoglobin: 10.1 g/dL — ABNORMAL LOW (ref 13.0–17.0)
LYMPHS ABS: 0.7 10*3/uL (ref 0.7–4.0)
Lymphocytes Relative: 36 %
MCH: 31 pg (ref 26.0–34.0)
MCHC: 34 g/dL (ref 30.0–36.0)
MCV: 91.1 fL (ref 78.0–100.0)
MONO ABS: 0.2 10*3/uL (ref 0.1–1.0)
MONOS PCT: 12 %
Neutro Abs: 1.1 10*3/uL — ABNORMAL LOW (ref 1.7–7.7)
Neutrophils Relative %: 51 %
PLATELETS: 26 10*3/uL — AB (ref 150–400)
RBC: 3.26 MIL/uL — AB (ref 4.22–5.81)
RDW: 16.1 % — AB (ref 11.5–15.5)
WBC: 2 10*3/uL — AB (ref 4.0–10.5)

## 2017-11-13 LAB — TYPE AND SCREEN
ABO/RH(D): O POS
ANTIBODY SCREEN: NEGATIVE
UNIT DIVISION: 0
UNIT DIVISION: 0
Unit division: 0
Unit division: 0

## 2017-11-13 LAB — BASIC METABOLIC PANEL
Anion gap: 9 (ref 5–15)
BUN: 10 mg/dL (ref 6–20)
CALCIUM: 8.6 mg/dL — AB (ref 8.9–10.3)
CO2: 24 mmol/L (ref 22–32)
CREATININE: 1.28 mg/dL — AB (ref 0.61–1.24)
Chloride: 104 mmol/L (ref 101–111)
GFR, EST NON AFRICAN AMERICAN: 58 mL/min — AB (ref >60)
Glucose, Bld: 86 mg/dL (ref 65–99)
Potassium: 3 mmol/L — ABNORMAL LOW (ref 3.5–5.1)
Sodium: 137 mmol/L (ref 135–145)

## 2017-11-13 LAB — BPAM RBC
BLOOD PRODUCT EXPIRATION DATE: 201905032359
Blood Product Expiration Date: 201905032359
Blood Product Expiration Date: 201905052359
Blood Product Expiration Date: 201905052359
ISSUE DATE / TIME: 201904082051
ISSUE DATE / TIME: 201904090040
ISSUE DATE / TIME: 201904091257
UNIT TYPE AND RH: 5100
Unit Type and Rh: 5100
Unit Type and Rh: 5100
Unit Type and Rh: 5100

## 2017-11-13 LAB — GLUCOSE, CAPILLARY
Glucose-Capillary: 84 mg/dL (ref 65–99)
Glucose-Capillary: 138 mg/dL — ABNORMAL HIGH (ref 65–99)

## 2017-11-13 MED ORDER — POTASSIUM CHLORIDE CRYS ER 20 MEQ PO TBCR
40.0000 meq | EXTENDED_RELEASE_TABLET | Freq: Two times a day (BID) | ORAL | Status: DC
  Administered 2017-11-13: 40 meq via ORAL
  Filled 2017-11-13: qty 2

## 2017-11-13 MED ORDER — PANTOPRAZOLE SODIUM 40 MG PO TBEC: 40.0000 mg | DELAYED_RELEASE_TABLET | Freq: Every day | ORAL | 0 refills | Status: DC

## 2017-11-13 MED ORDER — HEPARIN SOD (PORK) LOCK FLUSH 100 UNIT/ML IV SOLN: 500.0000 [IU] | Freq: Once | INTRAVENOUS | Status: DC

## 2017-11-13 MED ORDER — PANTOPRAZOLE SODIUM 40 MG PO TBEC: 40.0000 mg | DELAYED_RELEASE_TABLET | Freq: Two times a day (BID) | ORAL | 0 refills | Status: AC

## 2017-11-13 MED ORDER — LIDOCAINE VISCOUS 2 % MT SOLN: 15.0000 mL | Freq: Three times a day (TID) | OROMUCOSAL | 0 refills | Status: DC

## 2017-11-13 MED ORDER — SUCRALFATE 1 GM/10ML PO SUSP: 1.0000 g | Freq: Three times a day (TID) | ORAL | 0 refills | Status: DC

## 2017-11-13 NOTE — Progress Notes (Signed)
PT his wife were educated on neutropenic, and bleeding precautions. Both verbalized understanding and voice appreciation. He was d/c'd home via wheel chair ,home with wife and family

## 2017-11-13 NOTE — Progress Notes (Addendum)
Patient ID: Jesse Avila, male   DOB: Apr 16, 1951, 67 y.o.   MRN: [id number]    Progress Note   Subjective  Still c/o odynophagia- may be a little better- thinks lidocaine helped a little-"tastes terrible"  Barium swallow this am- no wall irregularity , mass or stricture -there is some dysmotility  mid and distal esophagus.   Objective   Vital signs in last 24 hours: Temp:  [97.4 F (36.3 C)-98.3 F (36.8 C)] 98.3 F (36.8 C) (04/12 0500) Pulse Rate:  [71-75] 75 (04/12 0500) Resp:  [18-20] 20 (04/12 0500) BP: (109-131)/(77-81) 109/77 (04/12 0500) SpO2:  [99 %-100 %] 99 % (04/12 0500) Last BM Date: 11/12/17 General:    white male  in NAD-family at bedside Heart:  Regular rate and rhythm; no murmurs Lungs: Respirations even and unlabored, lungs CTA bilaterally Abdomen:  Soft, nontender and nondistended. Normal bowel sounds. Extremities:  Without edema. Neurologic:  Alert and oriented,  grossly normal neurologically. Psych:  Cooperative. Normal mood and affect.  Intake/Output from previous day: 04/11 0701 - 04/12 0700 In: 360 [P.O.:360] Out: 3250 [Urine:3250] Intake/Output this shift: No intake/output data recorded.  Lab Results: Recent Labs    11/11/17 0348 11/12/17 0500 11/13/17 0500  WBC 0.7* 1.4* 2.0*  HGB 8.3* 8.8* 10.1*  HCT 23.1* 25.3* 29.7*  PLT 20* 18* 26*   BMET Recent Labs    11/11/17 0348 11/12/17 0500 11/13/17 0500  NA 136 137 137  K 3.3* 3.6 3.0*  CL 110 108 104  CO2 20* 20* 24  GLUCOSE 122* 79 86  BUN 21* 14 10  CREATININE 1.58* 1.42* 1.28*  CALCIUM 7.9* 8.4* 8.6*   LFT No results for input(s): PROT, ALBUMIN, AST, ALT, ALKPHOS, BILITOT, BILIDIR, IBILI in the last 72 hours. PT/INR Recent Labs    11/10/17 1800  LABPROT 25.0*  INR 2.29    Studies/Results: Dg Esophagus  Result Date: 11/13/2017 CLINICAL DATA:  History of small cell lung cancer with pancytopenia status post chemotherapy. Currently undergoing chemotherapy and radiation  therapy. Possible esophagitis. Patient feels like solids and liquids do not "go down RIGHT", "Feels like ingested material goes on RIGHT side of throat". EXAM: ESOPHOGRAM/BARIUM SWALLOW TECHNIQUE: Single contrast examination was performed using thin barium or water soluble. The patient was observed with fluoroscopy swallowing a 13 mm barium sulphate tablet. FLUOROSCOPY TIME:  Fluoroscopy Time:  4 minutes and 6 seconds Radiation Exposure Index (if provided by the fluoroscopic device): 67.0 mGy COMPARISON:  None. FINDINGS: Thin consistency barium contrast material was administered orally during fluoroscopic evaluation. On initial oral boluses, the contrast material moved promptly through the thoracic esophagus and into the stomach without obstruction or dysmotility. On subsequent boluses, however, dysmotility was repeatedly noted in the mid and distal esophagus with associated tertiary contractions and "to and fro" flow of contrast material. Patient complained of pain during 1 of these episodes. No focal wall irregularities to confirm an esophagitis. No mass, erosions or stricture. No evidence of a significant extrinsic mass effect. 13 mm barium tablet was then administered. This tablet was momentarily stuck within the lower esophagus, again likely related to the aforementioned dysmotility. The tablet eventually passed into the stomach without obstruction. IMPRESSION: 1. No wall irregularities or erosions seen to confirm an esophagitis. No obstructing mass or stricture. No evidence of a significant extrinsic mass effect. 2. 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 This is a likely component of patient's symptoms, perhaps  accentuated by an underlying occult esophagitis as suggested by the clinical data. Electronically Signed   By: [name] M.D.   On: 11/13/2017 10:33       Assessment / Plan:    #1 67 yo WM with  metastatic small cell lung Ca  Admitted with fatigue,hypotension, weakness, poor oral intake and odynophagia  Severe pancytopenia - chemo  Induced  Chemo induced esophagitis  Barium swallow does not show significant esophagitis , no mass or extrinsic compression  He should continue to improve with time  #2 afib- chronic anticoagulation #3 PVD   Plan; Diet as tolerated Continue BID PPI x 2 weeks then qam  Carafate susp 4 x daily between meals and bedtime  X one month  Viscous lidocaine prn   NO GI follow up needed  At present  but happy to see him  In the future prn      Contact  Amy Esterwood, P.A.-C               [phone]     Attending physician's note   Barium swallow was unremarkable for any significant esophagitis or stricture.  Expect gradual improvement.  No indication to do EGD at the present time.  May be more detrimental.  Recommendations as above-continue PPIs, Carafate, viscous lidocaine.  Did not tolerate GI cocktail.  Will sign off for now.  Carmell Austria, MD

## 2017-11-13 NOTE — Care Management Important Message (Signed)
Important Message  Patient Details  Name: AJAHNI NAY MRN: [id number] Date of Birth: [birth year]   Medicare Important Message Given:  Yes    Kerin Salen 11/13/2017, 12:57 [place] Message  Patient Details  Name: TRAVONNE SCHOWALTER MRN: [id number] Date of Birth: 10-22-1950   Medicare Important Message Given:  Yes    Kerin Salen 11/13/2017, 12:57 PM

## 2017-11-13 NOTE — Progress Notes (Signed)
These preliminary result these preliminary results were noted.  Awaiting final report.

## 2017-11-13 NOTE — Progress Notes (Signed)
Home health orders received. This CM spoke with wife who states that the pt does not need a HHRN but does need a HHPT. Choice offered and [facility] chosen. [employer] rep alerted of referral. Marney Doctor RN,BSN,NCM [phone]

## 2017-11-13 NOTE — Discharge Summary (Addendum)
Physician Discharge Summary  Jesse Avila WNU:[id number] DOB: [birth year] DOA: 11/09/2017  PCP: [name], MD  Admit date: 11/09/2017 Discharge date: 11/13/2017  Admitted From: Home.  Disposition:  Home  Recommendations for Outpatient Follow-up:  1. Follow up with PCP in 1-2 weeks 2. Please obtain BMP/CBC on Monday to check potassium and counts.  3. Please follow up with gastroenterology as needed.  4. We have stopped your eliquis , please follow up with oncology/PCP before you resume your eliquis.   Home Health:yes   Discharge Condition: guarded.  CODE STATUS: full code.  Diet recommendation: Heart Healthy  Brief/Interim Summary: Jesse Avila a 67 y.o.malewith medical history significant ofsmall cell lung cancer on chemotherapy last chemo on 3/18, 2nd cycle follows up with Dr Julien Nordmann, Okey Regal post chemo,A. fib on Eliquis,andCAD;who presents with complaints of generalized fatigue and weakness over the last 2 weeks. He was found to be hypotensive, pancytopenic on arrival to ED. He received about 4 units or prbc transfusion so far. His hemoglobin is stable around 8.     Discharge Diagnoses:  Principal Problem:   Symptomatic anemia Active Problems:   Small cell lung carcinoma, left (HCC)   Atrial fibrillation (HCC)   Malignant neoplasm of lung (HCC)   Acute kidney injury superimposed on chronic kidney disease (HCC)   Hypotension   Hypokalemia   Thrombocytopenia (HCC)   Chemotherapy induced neutropenia (HCC)  Fatigue and generalized weakness probably secondary to pancytopenia and specifically symptomatic anemia. Transfuse to keep hemoglobin greater than 7.  Repeat CBC in am with differential shows improvement in [place],. Hemoglobin around 8 and platelets improved to 26,000   Metastatic small cell lung cancer with mets to liver, pancreas: - follow  Up with Dr Julien Nordmann as recommended.    Hypokalemia  Replaced. Mag level wnl.   Pancytopenia:  Secondary to  chemotherapy. Pt was empirically started on zosyn for ANC <500. His ANC improved, cultures have been negative, discontinued zosyn.     Acute on Stage 3 CKD:  Probably sec to dehydration . Baseline creat is between 1.3 and 1.6.  Resume hydration and renal parameters show improvement to 1.4.   PAF: On eliquis at home for anti coagulation, which is being held for symptomatic anemia.  Rate controlled.   H/o pericardial effusion:  Echo does not show sig effusion today.   Hypertension:  Well controlled.   Dysphagia/ odynophagia: Pt reports some difficulty swallowing on the right side and he has some regurgitation. He was scheduled for barium swallow last week but couldn't get it done. He wants to know if barium swallow , can be done. He also reports some pain when swallowing. Possibly from esophagitis from chemo?  SLP eval recommended regular diet with thin liquids.  GI on board, EGD couldn't be done due to low counts. Barium swallow ordered, does not show any acute abnormalities.    Diarrhea:resolved.  D/c enteric precautions.    Type 2 DM: CBG (last 3)  RecentLabs(last2labs)  Recent Labs    11/11/17 2127 11/12/17 0754 11/12/17 1159  GLUCAP 92 80 95     Resume SSI and restarted lantus.    Questionable black stools None today. Repeat H&H is around 8. Appreciate GI recommendations.      Discharge Instructions  Discharge Instructions    Diet - low sodium heart healthy   Complete by:  As directed    Discharge instructions   Complete by:  As directed    Please follow up with oncology in one week  or as recommended.  We have held your eliquis for now, please restart it once your platelets have improved.  Please follow up with CBC on Monday to see if your counts have improved.  Please come to ED if you have any signs of bleeding.     Allergies as of 11/13/2017      Reactions   Cialis [tadalafil] Other (See Comments)   Headache       Medication  List    STOP taking these medications   apixaban 5 MG Tabs tablet Commonly known as:  ELIQUIS   methylPREDNISolone 4 MG Tbpk tablet Commonly known as:  MEDROL DOSEPAK     TAKE these medications   amiodarone 200 MG tablet Commonly known as:  PACERONE Take 1 tablet (200 mg total) by mouth daily.   furosemide 40 MG tablet Commonly known as:  LASIX TAKE 1 TABLET BY MOUTH TWICE A DAY What changed:    how much to take  how to take this  when to take this   HYDROcodone-acetaminophen 5-325 MG tablet Commonly known as:  NORCO Take 1 tablet by mouth every 6 (six) hours as needed for moderate pain.   insulin glargine 100 UNIT/ML injection Commonly known as:  LANTUS Inject 0.1 mLs (10 Units total) into the skin daily. What changed:    how much to take  when to take this   lidocaine 2 % solution Commonly known as:  XYLOCAINE Use as directed 15 mLs in the mouth or throat 3 (three) times daily before meals.   OVER THE COUNTER MEDICATION Take 1 capsule by mouth daily. Hemp Extract Supplement   pantoprazole 40 MG tablet Commonly known as:  PROTONIX Take 1 tablet (40 mg total) by mouth 2 (two) times daily for 15 days.   pantoprazole 40 MG tablet Commonly known as:  PROTONIX Take 1 tablet (40 mg total) by mouth daily. Start taking on:  11/29/2017   Potassium Chloride ER 20 MEQ Tbcr Take 10 mEq by mouth daily.   prochlorperazine 10 MG tablet Commonly known as:  COMPAZINE Take 1 tablet (10 mg total) by mouth every 6 (six) hours as needed for nausea or vomiting.   spironolactone 25 MG tablet Commonly known as:  ALDACTONE Take 1 tablet (25 mg total) by mouth daily.   sucralfate 1 GM/10ML suspension Commonly known as:  CARAFATE Take 10 mLs (1 g total) by mouth 4 (four) times daily - after meals and at bedtime.   zolpidem 10 MG tablet Commonly known as:  AMBIEN Take 10 mg by mouth at bedtime.      Follow-up Information    [name], MD. Schedule an appointment  as soon as possible for a visit in 1 week(s).   Specialty:  Family Medicine Contact information: [address], [place] [address] [phone]        Curt Bears, MD Follow up in 2 day(s).   Specialty:  Oncology Why:  on monday for CBC to check for counts.  Contact information: [address] [place] [address] [phone]          Allergies  Allergen Reactions  . Cialis [Tadalafil] Other (See Comments)    Headache     Consultations:  SLP.   Gastroenterology.    Procedures/Studies: Dg Esophagus  Result Date: 11/13/2017 CLINICAL DATA:  History of small cell lung cancer with pancytopenia status post chemotherapy. Currently undergoing chemotherapy and radiation therapy. Possible esophagitis. Patient feels like solids and liquids do not "  go down RIGHT", "Feels like ingested material goes on RIGHT side of throat". EXAM: ESOPHOGRAM/BARIUM SWALLOW TECHNIQUE: Single contrast examination was performed using thin barium or water soluble. The patient was observed with fluoroscopy swallowing a 13 mm barium sulphate tablet. FLUOROSCOPY TIME:  Fluoroscopy Time:  4 minutes and 6 seconds Radiation Exposure Index (if provided by the fluoroscopic device): 67.0 mGy COMPARISON:  None. FINDINGS: Thin consistency barium contrast material was administered orally during fluoroscopic evaluation. On initial oral boluses, the contrast material moved promptly through the thoracic esophagus and into the stomach without obstruction or dysmotility. On subsequent boluses, however, dysmotility was repeatedly noted in the mid and distal esophagus with associated tertiary contractions and "to and fro" flow of contrast material. Patient complained of pain during 1 of these episodes. No focal wall irregularities to confirm an esophagitis. No mass, erosions or stricture. No evidence of a significant extrinsic mass effect. 13 mm barium tablet was then administered. This tablet was  momentarily stuck within the lower esophagus, again likely related to the aforementioned dysmotility. The tablet eventually passed into the stomach without obstruction. IMPRESSION: 1. No wall irregularities or erosions seen to confirm an esophagitis. No obstructing mass or stricture. No evidence of a significant extrinsic mass effect. 2. 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 This is a likely component of patient's symptoms, perhaps accentuated by an underlying occult esophagitis as suggested by the clinical data. Electronically Signed   By: [name] M.D.   On: 11/13/2017 10:33      Subjective: No new complaints, the odynophagia is improving.   Discharge Exam: Vitals:   11/12/17 2116 11/13/17 0500  BP: 131/81 109/77  Pulse: 71 75  Resp: 20 20  Temp: 97.8 F (36.6 C) 98.3 F (36.8 C)  SpO2: 100% 99%   Vitals:   11/12/17 0511 11/12/17 1400 11/12/17 2116 11/13/17 0500  BP: 113/68 125/77 131/81 109/77  Pulse: 71 72 71 75  Resp: 20 18 20 20   Temp: 97.7 F (36.5 C) (!) 97.4 F (36.3 C) 97.8 F (36.6 C) 98.3 F (36.8 C)  TempSrc: Oral Oral Oral Oral  SpO2: 98% 100% 100% 99%  Weight:      Height:        General: Pt is alert, awake, not in acute distress Cardiovascular: RRR, S1/S2 +, no rubs, no gallops Respiratory: CTA bilaterally, no wheezing, no rhonchi Abdominal: Soft, NT, ND, bowel sounds + Extremities: no edema, no cyanosis    The results of significant diagnostics from this hospitalization (including imaging, microbiology, ancillary and laboratory) are listed below for reference.     Microbiology: Recent Results (from the past 240 hour(s))  Culture, Blood     Status: None (Preliminary result)   Collection Time: 11/09/17  4:35 PM  Result Value Ref Range Status   Specimen Description SITE NOT SPECIFIED  Final   Special Requests   Final    BOTTLES DRAWN AEROBIC AND ANAEROBIC  Blood Culture adequate volume   Culture   Final    NO GROWTH 4 DAYS Performed at [facility], [address], [place], [place] [address]    Report Status PENDING  Incomplete  Culture, Blood     Status: None (Preliminary result)   Collection Time: 11/09/17  5:15 PM  Result Value Ref Range Status   Specimen Description   Final    BLOOD RIGHT PORTA CATH Performed at [facility]  [address] [place], [place] [address]    Special Requests   Final    BOTTLES DRAWN AEROBIC AND ANAEROBIC Blood Culture adequate volume Performed at [facility] [address], [place], [place] [address]    Culture   Final    NO GROWTH 4 DAYS Performed at [facility], [place] [address], [place], [place] [address]    Report Status PENDING  Incomplete  MRSA PCR Screening     Status: None   Collection Time: 11/10/17  4:40 PM  Result Value Ref Range Status   MRSA by PCR NEGATIVE NEGATIVE Final    Comment:        The GeneXpert MRSA Assay (FDA approved for NASAL specimens only), is one component of a comprehensive MRSA colonization surveillance program. It is not intended to diagnose MRSA infection nor to guide or monitor treatment for MRSA infections. Performed at [facility], [place] [address], [place], [place] [address]      Labs: BNP (last 3 results) Recent Labs    02/02/17 1309 03/01/17 0134  BNP 452.2* 425.9*   Basic Metabolic Panel: Recent Labs  Lab 11/09/17 1549 11/09/17 2100 11/10/17 1017 11/10/17 2050 11/11/17 0348 11/12/17 0500 11/13/17 0500  NA 131*  --  136  --  136 137 137  K 2.9*  --  2.9* 2.7* 3.3* 3.6 3.0*  CL 95*  --  112*  --  110 108 104  CO2 25  --  20*  --  20* 20* 24  GLUCOSE 144*  --  113*  --  122* 79 86  BUN 34*  --  31*  --  21* 14 10  CREATININE 2.09*  --  1.83*  --  1.58* 1.42* 1.28*  CALCIUM 9.6  --  8.0*  --  7.9* 8.4* 8.6*  MG  --  2.0  --   --  2.1  --   --    Liver Function  Tests: Recent Labs  Lab 11/09/17 1549  AST 12  ALT 17  ALKPHOS 78  BILITOT 0.8  PROT 6.3*  ALBUMIN 2.3*   No results for input(s): LIPASE, AMYLASE in the last 168 hours. No results for input(s): AMMONIA in the last 168 hours. CBC: Recent Labs  Lab 11/09/17 1549 11/10/17 1017 11/10/17 1806 11/11/17 0348 11/12/17 0500 11/13/17 0500  WBC 0.9* 0.6*  --  0.7* 1.4* 2.0*  NEUTROABS 0.3* 0.2*  --  0.3* 0.6* 1.1*  HGB  --  6.8* 8.6* 8.3* 8.8* 10.1*  HCT 21.8* 19.7* 24.1* 23.1* 25.3* 29.7*  MCV 93.6 87.9  --  89.2 90.7 91.1  PLT 11* 27*  --  20* 18* 26*   Cardiac Enzymes: No results for input(s): CKTOTAL, CKMB, CKMBINDEX, TROPONINI in the last 168 hours. BNP: Invalid input(s): POCBNP CBG: Recent Labs  Lab 11/12/17 1159 11/12/17 1655 11/12/17 2120 11/13/17 0755 11/13/17 1159  GLUCAP 95 112* 112* 84 138*   D-Dimer No results for input(s): DDIMER in the last 72 hours. Hgb A1c No results for input(s): HGBA1C in the last 72 hours. Lipid Profile No results for input(s): CHOL, HDL, LDLCALC, TRIG, CHOLHDL, LDLDIRECT in the last 72 hours. Thyroid function studies No results for input(s): TSH, T4TOTAL, T3FREE, THYROIDAB in the last 72 hours.  Invalid input(s): FREET3 Anemia work up No results for input(s): VITAMINB12, FOLATE, FERRITIN, TIBC, IRON, RETICCTPCT in the last 72 hours. Urinalysis    Component Value Date/Time   COLORURINE YELLOW 11/09/2017 2120   APPEARANCEUR  HAZY (A) 11/09/2017 2120   LABSPEC 1.015 11/09/2017 2120   PHURINE 5.0 11/09/2017 2120   GLUCOSEU NEGATIVE 11/09/2017 2120   HGBUR MODERATE (A) 11/09/2017 2120   BILIRUBINUR NEGATIVE 11/09/2017 2120   KETONESUR NEGATIVE 11/09/2017 2120   PROTEINUR 100 (A) 11/09/2017 2120   UROBILINOGEN 0.2 05/15/2015 0900   NITRITE NEGATIVE 11/09/2017 2120   LEUKOCYTESUR NEGATIVE 11/09/2017 2120   Sepsis Labs Invalid input(s): PROCALCITONIN,  WBC,  LACTICIDVEN Microbiology Recent Results (from the past 240 hour(s))   Culture, Blood     Status: None (Preliminary result)   Collection Time: 11/09/17  4:35 PM  Result Value Ref Range Status   Specimen Description SITE NOT SPECIFIED  Final   Special Requests   Final    BOTTLES DRAWN AEROBIC AND ANAEROBIC Blood Culture adequate volume   Culture   Final    NO GROWTH 4 DAYS Performed at [facility], [place] [address], [place], [place] [address]    Report Status PENDING  Incomplete  Culture, Blood     Status: None (Preliminary result)   Collection Time: 11/09/17  5:15 PM  Result Value Ref Range Status   Specimen Description   Final    BLOOD RIGHT PORTA CATH Performed at [facility] [address], [place], [place] [address]    Special Requests   Final    BOTTLES DRAWN AEROBIC AND ANAEROBIC Blood Culture adequate volume Performed at [facility] [address], [place], [place] [address]    Culture   Final    NO GROWTH 4 DAYS Performed at [facility], [place] [address], [place], [place] [address]    Report Status PENDING  Incomplete  MRSA PCR Screening     Status: None   Collection Time: 11/10/17  4:40 PM  Result Value Ref Range Status   MRSA by PCR NEGATIVE NEGATIVE Final    Comment:        The GeneXpert MRSA Assay (FDA approved for NASAL specimens only), is one component of a comprehensive MRSA colonization surveillance program. It is not intended to diagnose MRSA infection nor to guide or monitor treatment for MRSA infections. Performed at [facility], [place] [address], [place], [place] [address]      Time coordinating discharge: 36 minutes  SIGNED:   Hosie Poisson, MD  [facility] 11/13/2017, 2:10 PM Pager   If 7PM-7AM, please contact night-coverage [email or web address] Password [email or web address]

## 2017-11-14 LAB — CULTURE, BLOOD (SINGLE)
Culture: NO GROWTH
Culture: NO GROWTH
SPECIAL REQUESTS: ADEQUATE
Special Requests: ADEQUATE

## 2017-11-16 ENCOUNTER — Telehealth: Payer: Self-pay | Admitting: Medical Oncology

## 2017-11-16 ENCOUNTER — Telehealth: Payer: Self-pay | Admitting: Cardiovascular Disease

## 2017-11-16 ENCOUNTER — Telehealth: Payer: Self-pay | Admitting: *Deleted

## 2017-11-16 NOTE — Telephone Encounter (Signed)
appt confirmed

## 2017-11-16 NOTE — Telephone Encounter (Signed)
"  I was discharged recently from the hospital.  Need to talk with my doctor before I have any treatments."

## 2017-11-16 NOTE — Telephone Encounter (Signed)
"  Need appointment Friday to talk with Dr. Julien Nordmann.  I ned to talk with him about chemotherapy, no health issues"  Routed call to collaborative nurse and provider for review and further patient communication through collaborative nurse.

## 2017-11-16 NOTE — Telephone Encounter (Signed)
I left a message for the patient to call.   Per the patient's discharge summary 4/12: "PAF: On eliquis at home for anti coagulation, which is being held for symptomatic anemia.  Rate controlled"  Patient was admitted on 4/8 with a Hgb - 7.5.

## 2017-11-16 NOTE — Telephone Encounter (Signed)
In hospital last week and they took him off Eliquis  Patient is concerned and would like to know why Please call to discuss

## 2017-11-16 NOTE — Progress Notes (Signed)
These preliminary result these preliminary results were noted.  Awaiting final report.

## 2017-11-17 NOTE — Telephone Encounter (Signed)
I left a message for the patient to call. 

## 2017-11-18 ENCOUNTER — Inpatient Hospital Stay (HOSPITAL_BASED_OUTPATIENT_CLINIC_OR_DEPARTMENT_OTHER): Payer: BLUE CROSS/BLUE SHIELD | Admitting: Oncology

## 2017-11-18 ENCOUNTER — Inpatient Hospital Stay: Payer: BLUE CROSS/BLUE SHIELD

## 2017-11-18 ENCOUNTER — Telehealth: Payer: Self-pay | Admitting: Oncology

## 2017-11-18 ENCOUNTER — Encounter: Payer: Self-pay | Admitting: Oncology

## 2017-11-18 VITALS — BP 104/53 | HR 89 | Temp 97.6°F | Resp 18 | Ht 73.0 in | Wt 158.4 lb

## 2017-11-18 DIAGNOSIS — C3412 Malignant neoplasm of upper lobe, left bronchus or lung: Secondary | ICD-10-CM

## 2017-11-18 DIAGNOSIS — D696 Thrombocytopenia, unspecified: Secondary | ICD-10-CM

## 2017-11-18 DIAGNOSIS — C3492 Malignant neoplasm of unspecified part of left bronchus or lung: Secondary | ICD-10-CM

## 2017-11-18 DIAGNOSIS — D6481 Anemia due to antineoplastic chemotherapy: Secondary | ICD-10-CM

## 2017-11-18 DIAGNOSIS — C7951 Secondary malignant neoplasm of bone: Secondary | ICD-10-CM

## 2017-11-18 DIAGNOSIS — Z5112 Encounter for antineoplastic immunotherapy: Secondary | ICD-10-CM | POA: Diagnosis present

## 2017-11-18 DIAGNOSIS — Z79899 Other long term (current) drug therapy: Secondary | ICD-10-CM | POA: Diagnosis not present

## 2017-11-18 DIAGNOSIS — Z5111 Encounter for antineoplastic chemotherapy: Secondary | ICD-10-CM

## 2017-11-18 DIAGNOSIS — Z7189 Other specified counseling: Secondary | ICD-10-CM

## 2017-11-18 DIAGNOSIS — D701 Agranulocytosis secondary to cancer chemotherapy: Secondary | ICD-10-CM | POA: Diagnosis not present

## 2017-11-18 DIAGNOSIS — E876 Hypokalemia: Secondary | ICD-10-CM

## 2017-11-18 DIAGNOSIS — E860 Dehydration: Secondary | ICD-10-CM

## 2017-11-18 DIAGNOSIS — Z713 Dietary counseling and surveillance: Secondary | ICD-10-CM

## 2017-11-18 LAB — CBC WITH DIFFERENTIAL (CANCER CENTER ONLY)
BASOS ABS: 0 10*3/uL (ref 0.0–0.1)
Basophils Relative: 0 %
EOS PCT: 0 %
Eosinophils Absolute: 0 10*3/uL (ref 0.0–0.5)
HEMATOCRIT: 31.9 % — AB (ref 38.4–49.9)
Hemoglobin: 10.8 g/dL — ABNORMAL LOW (ref 13.0–17.1)
LYMPHS PCT: 24 %
Lymphs Abs: 1.1 10*3/uL (ref 0.9–3.3)
MCH: 31.1 pg (ref 27.2–33.4)
MCHC: 33.9 g/dL (ref 32.0–36.0)
MCV: 91.9 fL (ref 79.3–98.0)
Monocytes Absolute: 0.5 10*3/uL (ref 0.1–0.9)
Monocytes Relative: 12 %
NEUTROS ABS: 3 10*3/uL (ref 1.5–6.5)
Neutrophils Relative %: 64 %
PLATELETS: 78 10*3/uL — AB (ref 140–400)
RBC: 3.47 MIL/uL — AB (ref 4.20–5.82)
RDW: 15.8 % — ABNORMAL HIGH (ref 11.0–14.6)
WBC: 4.6 10*3/uL (ref 4.0–10.3)

## 2017-11-18 LAB — CMP (CANCER CENTER ONLY)
ALT: 16 U/L (ref 0–55)
ANION GAP: 9 (ref 3–11)
AST: 12 U/L (ref 5–34)
Albumin: 3 g/dL — ABNORMAL LOW (ref 3.5–5.0)
Alkaline Phosphatase: 78 U/L (ref 40–150)
BUN: 21 mg/dL (ref 7–26)
CO2: 28 mmol/L (ref 22–29)
Calcium: 9.3 mg/dL (ref 8.4–10.4)
Chloride: 98 mmol/L (ref 98–109)
Creatinine: 2.05 mg/dL — ABNORMAL HIGH (ref 0.70–1.30)
GFR, EST NON AFRICAN AMERICAN: 33 mL/min — AB (ref >60)
GFR, Est AFR Am: 38 mL/min — ABNORMAL LOW (ref >60)
Glucose, Bld: 163 mg/dL — ABNORMAL HIGH (ref 70–140)
POTASSIUM: 3.3 mmol/L — AB (ref 3.5–5.1)
Sodium: 135 mmol/L — ABNORMAL LOW (ref 136–145)
TOTAL PROTEIN: 6.2 g/dL — AB (ref 6.4–8.3)
Total Bilirubin: 0.6 mg/dL (ref 0.2–1.2)

## 2017-11-18 LAB — SAMPLE TO BLOOD BANK

## 2017-11-18 MED ORDER — DRONABINOL 2.5 MG PO CAPS: 2.5000 mg | ORAL_CAPSULE | Freq: Two times a day (BID) | ORAL | 0 refills | Status: DC

## 2017-11-18 NOTE — Progress Notes (Signed)
[facility] OFFICE PROGRESS NOTE  [name], MD [place] [address] [place] [address]  DIAGNOSIS: Extensive stage (T4, N2, M1b) small cell lung cancer presented with large left upper lobe lung mass with mediastinal invasion as well as pancreatic lesion and suspicious left iliac bone metastasis diagnosed in February 2018.  PRIOR THERAPY: 1) Systemic chemotherapy with carboplatin for AUC of 5 on day 1 and etoposide 100 MG/M2 on days 1, 2 and 3 with Neulasta support status post 6 cycles.  Last dose of chemotherapy was given January 14, 2017 with partial response. This is concurrent with radiotherapy. This also concurrent with radiation to the large lung mass. 2) prophylactic cranial irradiation under the care of Dr. Sondra Come. 3) Systemic chemotherapy with carboplatin for AUC of 5 on day 1, etoposide 100 mg/M2 on days 1, 2 and 3, Tecentriq (Atezolizumab) 1200 mg IV every 3 weeks with Neulasta support.  First dose October 28, 2017.  Status post 1 cycle.  Carboplatin and etoposide were stopped due to intolerance.  CURRENT THERAPY: Tecentriq 1200 mg IV every 3 weeks as a single agent.  He is expected to begin his first cycle on 11/25/2017.   INTERVAL HISTORY: BATU CASSIN 67 y.o. male returns for routine follow-up visit accompanied by his wife.  Since his last visit, the patient was hospitalized for dehydration and anemia.  He received 4 units of packed red blood cells during his hospitalization.  He has not had any bleeding.  His Eliquis was stopped during his hospitalization.  The patient still having a lot of fatigue.  He is not sure if he wants to proceed with the same chemotherapy that is previously been ordered.  The patient denies fevers and chills.  Denies chest pain, shortness breath, cough, hemoptysis.  Reports intermittent nausea but no vomiting.  He reports a decreased appetite but has not lost any weight.  He requests medication to help stimulate his appetite.  The  patient is here for evaluation prior to cycle 2 of his treatment.  MEDICAL HISTORY: Past Medical History:  Diagnosis Date  . Atrial fibrillation ([place])   . Carotid artery occlusion   . DJD (degenerative joint disease) of knee   . Essential hypertension, benign    takes Benicar daily  . Goals of care, counseling/discussion 09/18/2016  . History of colon polyps    benign  . History of gout   . History of radiation therapy 09/30/16-11/10/16   left lung 60 Gy in 30 fractions  . Hyperlipidemia    takes Fenofibrate and Crestor daily  . Insomnia    takes Ambien nightly as needed  . Joint pain   . Lung cancer ([place])   . Nocturia   . Peripheral vascular disease ([place])   . Primary localized osteoarthritis of left knee 12/26/2014  . Stroke [facility]) 2015?   takes Plavix daily as well as Pletal,not on plavix at present 05/15/15  . 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  MEDICATIONS:  Current Outpatient Medications  Medication Sig Dispense Refill  . amiodarone (PACERONE) 200 MG tablet Take 1 tablet (200 mg total) by mouth daily. 90 tablet 0  . dronabinol (MARINOL) 2.5 MG capsule Take 1 capsule (2.5 mg total) by mouth 2 (two) times daily before a meal. 60 capsule 0  . furosemide (LASIX) 40 MG tablet TAKE  1 TABLET BY MOUTH TWICE A DAY (Patient taking differently: TAKE 40 MG BY MOUTH TWICE A DAY) 60 tablet 6  . HYDROcodone-acetaminophen (NORCO) 5-325 MG tablet Take 1 tablet by mouth every 6 (six) hours as needed for moderate pain. 30 tablet 0  . insulin glargine (LANTUS) 100 UNIT/ML injection Inject 0.1 mLs (10 Units total) into the skin daily. (Patient taking differently: Inject 15 Units into the skin at bedtime. ) 10 mL 11  . KLOR-CON M20 20 MEQ tablet TAKE 1/2 TABLET (10MEQ) BY MOUTH DAILY. 10 tablet 0  . lidocaine (XYLOCAINE) 2 %  solution Use as directed 15 mLs in the mouth or throat 3 (three) times daily before meals. 100 mL 0  . OVER THE COUNTER MEDICATION Take 1 capsule by mouth daily. Hemp Extract Supplement    . pantoprazole (PROTONIX) 40 MG tablet Take 1 tablet (40 mg total) by mouth 2 (two) times daily for 15 days. 30 tablet 0  . [START ON 11/29/2017] pantoprazole (PROTONIX) 40 MG tablet Take 1 tablet (40 mg total) by mouth daily. 30 tablet 0  . prochlorperazine (COMPAZINE) 10 MG tablet Take 1 tablet (10 mg total) by mouth every 6 (six) hours as needed for nausea or vomiting. 30 tablet 0  . spironolactone (ALDACTONE) 25 MG tablet Take 1 tablet (25 mg total) by mouth daily. 30 tablet 0  . sucralfate (CARAFATE) 1 GM/10ML suspension Take 10 mLs (1 g total) by mouth 4 (four) times daily - after meals and at bedtime. 420 mL 0  . zolpidem (AMBIEN) 10 MG tablet Take 10 mg by mouth at bedtime.      No current facility-administered medications for this visit.     SURGICAL HISTORY:  Past Surgical History:  Procedure Laterality Date  . ABDOMINAL AORTAGRAM N/A 03/29/2012   Procedure: ABDOMINAL Maxcine Ham;  Surgeon: Angelia Mould, MD;  Location: [facility] CATH LAB;  Service: Cardiovascular;  Laterality: N/A;  . CARDIOVERSION N/A 05/08/2017   Procedure: CARDIOVERSION;  Surgeon: Minna Merritts, MD;  Location: [facility] ORS;  Service: Cardiovascular;  Laterality: N/A;  . COLONOSCOPY    . ENDARTERECTOMY Right 05/09/2014   Procedure: RIGHT CAROTID ENDARTERECTOMY WITH PATCH ANGIOPLASTY;  Surgeon: Conrad Rome, MD;  Location: [facility];  Service: Vascular;  Laterality: Right;  . ENDARTERECTOMY FEMORAL Right 05/17/2015   Procedure: ENDARTERECTOMY FEMORAL;  Surgeon: Angelia Mould, MD;  Location: [facility];  Service: Vascular;  Laterality: Right;  . ENDOBRONCHIAL ULTRASOUND Bilateral 09/08/2016   Procedure: ENDOBRONCHIAL ULTRASOUND;  Surgeon: Rigoberto Noel, MD;  Location: [facility] ENDOSCOPY;  Service: Cardiopulmonary;  Laterality: Bilateral;  .  EUS N/A 10/08/2017   Procedure: UPPER ENDOSCOPIC ULTRASOUND (EUS) LINEAR;  Surgeon: [name], MD;  Location: [facility] ENDOSCOPY;  Service: Endoscopy;  Laterality: N/A;  . FEMORAL-POPLITEAL BYPASS GRAFT Right 05/17/2015   Procedure: BYPASS GRAFT FEMORAL-BELOW KNEE POPLITEAL ARTERY, using gortex propaten graft 6 mm x 80 cm;  Surgeon: Angelia Mould, MD;  Location: [facility];  Service: Vascular;  Laterality: Right;  . IR GENERIC HISTORICAL  09/25/2016   IR FLUORO GUIDE PORT INSERTION RIGHT 09/25/2016 Markus Daft, MD [facility]-INTERV RAD  . IR GENERIC HISTORICAL  09/25/2016   IR US GUIDE VASC ACCESS RIGHT 09/25/2016 Markus Daft, MD [facility]INTER[facility]  . KNEE ARTHROSCOPY Right 11/2009  . LOWER EXTREMITY ANGIOGRAM Bilateral 03/23/2015   Procedure: Lower Extremity Angiogram;  Surgeon: Angelia Mould, MD;  Location: [facility] CV LAB;  Service: Cardiovascular;  Laterality: Bilateral;  . MENISCUS REPAIR  11/2009  .  PARTIAL KNEE ARTHROPLASTY Left 12/26/2014   Procedure: UNICOMPARTMENTAL KNEE;  Surgeon: Marchia Bond, MD;  Location: [facility];  Service: Orthopedics;  Laterality: Left;  . PATCH ANGIOPLASTY Right 05/17/2015   Procedure: VEIN PATCH ANGIOPLASTY ILEOFEMORAL ARTERY;  Surgeon: Angelia Mould, MD;  Location: [facility];  Service: Vascular;  Laterality: Right;  . PERCUTANEOUS STENT INTERVENTION N/A 05/10/2012   Procedure: PERCUTANEOUS STENT INTERVENTION;  Surgeon: Angelia Mould, MD;  Location: [facility] CATH LAB;  Service: Cardiovascular;  Laterality: N/A;  . PERIPHERAL VASCULAR CATHETERIZATION N/A 03/23/2015   Procedure: Abdominal Aortogram;  Surgeon: Angelia Mould, MD;  Location: [facility] CV LAB;  Service: Cardiovascular;  Laterality: N/A;  . STENTS     PLACED IN ??BOTH LEGS   2013?  . TEE WITHOUT CARDIOVERSION N/A 03/06/2017   Procedure: TRANSESOPHAGEAL ECHOCARDIOGRAM (TEE);  Surgeon: Larey Dresser, MD;  Location: [facility] ENDOSCOPY;  Service: Cardiovascular;  Laterality: N/A;  . TEE WITHOUT  CARDIOVERSION N/A 05/08/2017   Procedure: TRANSESOPHAGEAL ECHOCARDIOGRAM (TEE);  Surgeon: Minna Merritts, MD;  Location: [facility] ORS;  Service: Cardiovascular;  Laterality: N/A;    REVIEW OF SYSTEMS:   Review of Systems  Constitutional: Negative for chills, fever and unexpected weight change. Positive decreased appetite and fatigue. HENT:   Negative for mouth sores, nosebleeds, sore throat and trouble swallowing.   Eyes: Negative for eye problems and icterus.  Respiratory: Negative for cough, hemoptysis, shortness of breath and wheezing.   Cardiovascular: Negative for chest pain and leg swelling.  Gastrointestinal: Negative for abdominal pain, constipation, diarrhea, and vomiting. Positive for nausea. Genitourinary: Negative for bladder incontinence, difficulty urinating, dysuria, frequency and hematuria.   Musculoskeletal: Negative for back pain, gait problem, neck pain and neck stiffness.  Skin: Negative for itching and rash.  Neurological: Negative for dizziness, extremity weakness, gait problem, headaches, light-headedness and seizures.  Hematological: Negative for adenopathy. Does not bruise/bleed easily.  Psychiatric/Behavioral: Negative for confusion, depression and sleep disturbance. The patient is not nervous/anxious.     PHYSICAL EXAMINATION:  Blood pressure (!) 104/53, pulse 89, temperature 97.6 F (36.4 C), temperature source Oral, resp. rate 18, height 6\' 1"  (1.854 m), weight 158 lb 6.4 oz (71.8 kg), SpO2 98 %.  ECOG PERFORMANCE STATUS: 1 - Symptomatic but completely ambulatory  Physical Exam  Constitutional: Oriented to person, place, and time. No distress.  HENT:  Head: Normocephalic and atraumatic.  Mouth/Throat: Oropharynx is clear and moist. No oropharyngeal exudate.  Eyes: Conjunctivae are normal. Right eye exhibits no discharge. Left eye exhibits no discharge. No scleral icterus.  Neck: Normal range of motion. Neck supple.  Cardiovascular: Normal rate, regular  rhythm, normal heart sounds and intact distal pulses.   Pulmonary/Chest: Effort normal and breath sounds normal. No respiratory distress. No wheezes. No rales.  Abdominal: Soft. Bowel sounds are normal. Exhibits no distension and no mass. There is no tenderness.  Musculoskeletal: Normal range of motion. Exhibits no edema.  Lymphadenopathy:    No cervical adenopathy.  Neurological: Alert and oriented to person, place, and time. Exhibits normal muscle tone. Gait normal. Coordination normal.  Skin: Skin is warm and dry. No rash noted. Not diaphoretic. No erythema. No pallor.  Psychiatric: Mood, memory and judgment normal.  Vitals reviewed.  LABORATORY DATA: Lab Results  Component Value Date   WBC 4.6 11/18/2017   HGB 10.1 (L) 11/13/2017   HCT 31.9 (L) 11/18/2017   MCV 91.9 11/18/2017   PLT 78 (L) 11/18/2017      Chemistry      Component  Value Date/Time   NA 135 (L) 11/18/2017 1040   NA 137 06/19/2017 0922   K 3.3 (L) 11/18/2017 1040   K 4.1 06/19/2017 0922   CL 98 11/18/2017 1040   CO2 28 11/18/2017 1040   CO2 28 06/19/2017 0922   BUN 21 11/18/2017 1040   BUN 12.6 06/19/2017 0922   CREATININE 2.05 (H) 11/18/2017 1040   CREATININE 1.3 06/19/2017 0922      Component Value Date/Time   CALCIUM 9.3 11/18/2017 1040   CALCIUM 9.6 06/19/2017 0922   ALKPHOS 78 11/18/2017 1040   ALKPHOS 116 06/19/2017 0922   AST 12 11/18/2017 1040   AST 15 06/19/2017 0922   ALT 16 11/18/2017 1040   ALT 14 06/19/2017 0922   BILITOT 0.6 11/18/2017 1040   BILITOT 0.56 06/19/2017 0922       RADIOGRAPHIC STUDIES:  Dg Esophagus  Result Date: 11/13/2017 CLINICAL DATA:  History of small cell lung cancer with pancytopenia status post chemotherapy. Currently undergoing chemotherapy and radiation therapy. Possible esophagitis. Patient feels like solids and liquids do not "go down RIGHT", "Feels like ingested material goes on RIGHT side of throat". EXAM: ESOPHOGRAM/BARIUM SWALLOW TECHNIQUE: Single  contrast examination was performed using thin barium or water soluble. The patient was observed with fluoroscopy swallowing a 13 mm barium sulphate tablet. FLUOROSCOPY TIME:  Fluoroscopy Time:  4 minutes and 6 seconds Radiation Exposure Index (if provided by the fluoroscopic device): 67.0 mGy COMPARISON:  None. FINDINGS: Thin consistency barium contrast material was administered orally during fluoroscopic evaluation. On initial oral boluses, the contrast material moved promptly through the thoracic esophagus and into the stomach without obstruction or dysmotility. On subsequent boluses, however, dysmotility was repeatedly noted in the mid and distal esophagus with associated tertiary contractions and "to and fro" flow of contrast material. Patient complained of pain during 1 of these episodes. No focal wall irregularities to confirm an esophagitis. No mass, erosions or stricture. No evidence of a significant extrinsic mass effect. 13 mm barium tablet was then administered. This tablet was momentarily stuck within the lower esophagus, again likely related to the aforementioned dysmotility. The tablet eventually passed into the stomach without obstruction. IMPRESSION: 1. No wall irregularities or erosions seen to confirm an esophagitis. No obstructing mass or stricture. No evidence of a significant extrinsic mass effect. 2. 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 This is a likely component of patient's symptoms, perhaps accentuated by an underlying occult esophagitis as suggested by the clinical data. Electronically Signed   By: [name] M.D.   On: 11/13/2017 10:33     ASSESSMENT/PLAN:  Small cell lung carcinoma, left (HCC) This is a very pleasant 67 year old white male with extensive stage small cell lung cancer completed 6 cycles of systemic chemotherapy with carboplatin and etoposide concurrent with radiation to the  large left upper lobe mass.  The patient tolerated the treatment well except for the chemotherapy-induced pancytopenia and significant anemia. This was followed by prophylactic cranial irradiation. The patient was on observation for almost 9 months. Repeat CT scan of the chest, abdomen and pelvis showed no concerning findings for disease recurrence in the chest but the patient has new retroperitoneal mass between the pancreatic head and transverse duodenum.   He underwent endoscopic ultrasound and biopsy of the pancreatic mass and the final pathology was consistent with recurrent small cell carcinoma. He was started on treatment with carboplatin for AUC of 5  on day 1, etoposide 100 mg/M2 on days 1, 2 and 3 as well as a Tecentriq (Atezolizumab) 1200 mg IV every 3 weeks with Neulasta support.  Status post 1 cycle.  He had difficulty with worsening of his fatigue, decreased appetite, dehydration, and anemia following his first cycle of chemotherapy.  He was admitted to the hospital for dehydration and anemia. The patient is concerned about taking further treatment with the current treatment regimen.  The patient was seen with Dr. Julien Nordmann.  Treatment options were reviewed with the patient and his wife including continuing the current treatment versus changing to immunotherapy only with Tecentriq every 3 weeks.  The patient is having a difficult time tolerating his current chemotherapy and would like to proceed with single agent Tecentriq 1200 mg IV every 3 weeks. He would like an additional week to recover.  We will plan to resume his treatment next week with single agent Tecentriq. He was reminded of the adverse effects of this treatment were discussed including but not limited to immune mediated the skin rash, diarrhea, inflammation of the lung, kidney, liver, thyroid or other endocrine dysfunction.  The patient's anemia has improved and no transfusion is indicated today.  Platelets remain slightly low  at 78,000.  He was instructed to continue to hold his Eliquis.  We will recheck a CBC next week and give him further instructions.  For decreased appetite, I have referred him to the dietitian.  He was also given a prescription for Marinol 2.5 mg twice a day to stimulate his appetite.  For the low back pain, he may continue Norco on as-needed basis.  The patient will proceed with labs and treatment next week.  The patient will come back for follow-up visit in 4 weeks for evaluation with the start of cycle #3.  He was advised to call immediately if he has any concerning symptoms in the interval. The patient voices understanding of current disease status and treatment options and is in agreement with the current care plan. All questions were answered. The patient knows to call the clinic with any problems,    Orders Placed This Encounter  Procedures  . Amb Referral to Nutrition and Diabetic E    Referral Priority:   Routine    Referral Type:   Consultation    Referral Reason:   Specialty Services Required    Number of Visits Requested:   1  . Sample to Blood Bank   Mikey Bussing, DNP, AGPCNP-BC, AOCNP 11/19/17  ADDENDUM: Hematology/Oncology Attending: I had a face-to-face encounter with the patient.  I recommended his care plan.  This is a very pleasant 67 years old white male with recurrent small cell lung cancer.  He was a started on systemic chemotherapy with carboplatin, etoposide and Tecentriq status post 1 cycle.  Unfortunately the patient has significant pancytopenia with his treatment as well as increasing fatigue and weakness.  He was admitted to [facility] with hypotension and pancytopenia.  He received 4 units of PRBCs transfusion and his hemoglobin was around 8 after the transfusion. He came today for evaluation.  He continues to have significant fatigue and weakness.  He is not interested in continuing with the same systemic chemotherapy regimen. I had a lengthy  discussion with the patient and his wife about his current condition.  I discussed with him the option of palliative care and hospice referral versus consideration of treatment with just immunotherapy with Tecentriq every 3 weeks.  Has platelets count are low today.  The patient is interested in proceeding with immunotherapy as a single agent.  He is expected to start the first cycle of this treatment next week. He will come back for follow-up visit in 4 weeks for evaluation with the second cycle of his treatment. The patient was advised to call immediately if he has any concerning symptoms in the interval.  Disclaimer: This note was dictated with voice recognition software. Similar sounding words can inadvertently be transcribed and may be missed upon review. Eilleen Kempf, MD 11/19/17

## 2017-11-18 NOTE — Telephone Encounter (Signed)
Scheduled appt per 4/17 los - Gave patient AVS and calender per los.  

## 2017-11-19 ENCOUNTER — Ambulatory Visit: Payer: Medicare Other

## 2017-11-19 NOTE — Assessment & Plan Note (Signed)
This is a very pleasant 67 year old white male with extensive stage small cell lung cancer completed 6 cycles of systemic chemotherapy with carboplatin and etoposide concurrent with radiation to the large left upper lobe mass.  The patient tolerated the treatment well except for the chemotherapy-induced pancytopenia and significant anemia. This was followed by prophylactic cranial irradiation. The patient was on observation for almost 9 months. Repeat CT scan of the chest, abdomen and pelvis showed no concerning findings for disease recurrence in the chest but the patient has new retroperitoneal mass between the pancreatic head and transverse duodenum.   He underwent endoscopic ultrasound and biopsy of the pancreatic mass and the final pathology was consistent with recurrent small cell carcinoma. He was started on treatment with carboplatin for AUC of 5 on day 1, etoposide 100 mg/M2 on days 1, 2 and 3 as well as a Tecentriq (Atezolizumab) 1200 mg IV every 3 weeks with Neulasta support.  Status post 1 cycle.  He had difficulty with worsening of his fatigue, decreased appetite, dehydration, and anemia following his first cycle of chemotherapy.  He was admitted to the hospital for dehydration and anemia. The patient is concerned about taking further treatment with the current treatment regimen.  The patient was seen with Dr. Julien Nordmann.  Treatment options were reviewed with the patient and his wife including continuing the current treatment versus changing to immunotherapy only with Tecentriq every 3 weeks.  The patient is having a difficult time tolerating his current chemotherapy and would like to proceed with single agent Tecentriq 1200 mg IV every 3 weeks. He would like an additional week to recover.  We will plan to resume his treatment next week with single agent Tecentriq. He was reminded of the adverse effects of this treatment were discussed including but not limited to immune mediated the skin rash,  diarrhea, inflammation of the lung, kidney, liver, thyroid or other endocrine dysfunction.  The patient's anemia has improved and no transfusion is indicated today.  Platelets remain slightly low at 78,000.  He was instructed to continue to hold his Eliquis.  We will recheck a CBC next week and give him further instructions.  For decreased appetite, I have referred him to the dietitian.  He was also given a prescription for Marinol 2.5 mg twice a day to stimulate his appetite.  For the low back pain, he may continue Norco on as-needed basis.  The patient will proceed with labs and treatment next week.  The patient will come back for follow-up visit in 4 weeks for evaluation with the start of cycle #3.  He was advised to call immediately if he has any concerning symptoms in the interval. The patient voices understanding of current disease status and treatment options and is in agreement with the current care plan. All questions were answered. The patient knows to call the clinic with any problems,

## 2017-11-20 ENCOUNTER — Ambulatory Visit: Payer: Medicare Other

## 2017-11-20 NOTE — Telephone Encounter (Signed)
Spoke with patient and he states that they told him to stay off of it for a couple of more weeks and then he could restart. Reviewed that the medication can cause bleeding and that was the reason for holding it. He verbalized understanding, was appreciative for the call, and had no further questions at this time.

## 2017-11-21 ENCOUNTER — Ambulatory Visit: Payer: Medicare Other

## 2017-11-23 ENCOUNTER — Telehealth: Payer: Self-pay | Admitting: Medical Oncology

## 2017-11-23 ENCOUNTER — Other Ambulatory Visit: Payer: Self-pay | Admitting: Medical Oncology

## 2017-11-23 DIAGNOSIS — C3492 Malignant neoplasm of unspecified part of left bronchus or lung: Secondary | ICD-10-CM

## 2017-11-23 MED ORDER — HYDROCODONE-ACETAMINOPHEN 5-325 MG PO TABS: 1.0000 | ORAL_TABLET | Freq: Four times a day (QID) | ORAL | 0 refills | Status: DC | PRN

## 2017-11-23 NOTE — Progress Notes (Signed)
Pt notified to pick up rx tomorrow.

## 2017-11-23 NOTE — Telephone Encounter (Signed)
Requests refill for hydrocodone.

## 2017-11-25 ENCOUNTER — Telehealth: Payer: Self-pay | Admitting: *Deleted

## 2017-11-25 ENCOUNTER — Other Ambulatory Visit: Payer: Medicare Other

## 2017-11-25 ENCOUNTER — Other Ambulatory Visit: Payer: Self-pay | Admitting: *Deleted

## 2017-11-25 ENCOUNTER — Inpatient Hospital Stay: Payer: BLUE CROSS/BLUE SHIELD

## 2017-11-25 VITALS — BP 113/63 | HR 72 | Temp 97.5°F | Resp 17 | Ht 73.0 in | Wt 158.0 lb

## 2017-11-25 DIAGNOSIS — Z5112 Encounter for antineoplastic immunotherapy: Secondary | ICD-10-CM | POA: Diagnosis not present

## 2017-11-25 DIAGNOSIS — C3492 Malignant neoplasm of unspecified part of left bronchus or lung: Secondary | ICD-10-CM

## 2017-11-25 DIAGNOSIS — Z713 Dietary counseling and surveillance: Secondary | ICD-10-CM

## 2017-11-25 DIAGNOSIS — E876 Hypokalemia: Secondary | ICD-10-CM

## 2017-11-25 LAB — CBC WITH DIFFERENTIAL (CANCER CENTER ONLY)
Basophils Absolute: 0 10*3/uL (ref 0.0–0.1)
Basophils Relative: 0 %
Eosinophils Absolute: 0 10*3/uL (ref 0.0–0.5)
Eosinophils Relative: 1 %
HCT: 32.6 % — ABNORMAL LOW (ref 38.4–49.9)
Hemoglobin: 10.8 g/dL — ABNORMAL LOW (ref 13.0–17.1)
LYMPHS ABS: 1.7 10*3/uL (ref 0.9–3.3)
LYMPHS PCT: 25 %
MCH: 31.1 pg (ref 27.2–33.4)
MCHC: 33.1 g/dL (ref 32.0–36.0)
MCV: 93.9 fL (ref 79.3–98.0)
MONO ABS: 0.6 10*3/uL (ref 0.1–0.9)
MONOS PCT: 10 %
Neutro Abs: 4.2 10*3/uL (ref 1.5–6.5)
Neutrophils Relative %: 64 %
Platelet Count: 139 10*3/uL — ABNORMAL LOW (ref 140–400)
RBC: 3.47 MIL/uL — ABNORMAL LOW (ref 4.20–5.82)
RDW: 16.9 % — AB (ref 11.0–14.6)
WBC Count: 6.6 10*3/uL (ref 4.0–10.3)

## 2017-11-25 LAB — CMP (CANCER CENTER ONLY)
ALT: 13 U/L (ref 0–55)
ANION GAP: 13 — AB (ref 3–11)
AST: 11 U/L (ref 5–34)
Albumin: 3.2 g/dL — ABNORMAL LOW (ref 3.5–5.0)
Alkaline Phosphatase: 83 U/L (ref 40–150)
BILIRUBIN TOTAL: 0.5 mg/dL (ref 0.2–1.2)
BUN: 24 mg/dL (ref 7–26)
CALCIUM: 9.6 mg/dL (ref 8.4–10.4)
CO2: 27 mmol/L (ref 22–29)
Chloride: 98 mmol/L (ref 98–109)
Creatinine: 2.1 mg/dL — ABNORMAL HIGH (ref 0.70–1.30)
GFR, EST NON AFRICAN AMERICAN: 32 mL/min — AB (ref >60)
GFR, Est AFR Am: 37 mL/min — ABNORMAL LOW (ref >60)
Glucose, Bld: 136 mg/dL (ref 70–140)
POTASSIUM: 3.1 mmol/L — AB (ref 3.5–5.1)
Sodium: 138 mmol/L (ref 136–145)
Total Protein: 6.2 g/dL — ABNORMAL LOW (ref 6.4–8.3)

## 2017-11-25 LAB — TSH: TSH: 0.821 u[IU]/mL (ref 0.320–4.118)

## 2017-11-25 MED ORDER — POTASSIUM CHLORIDE CRYS ER 20 MEQ PO TBCR: 20.0000 meq | EXTENDED_RELEASE_TABLET | Freq: Every day | ORAL | 0 refills | Status: DC

## 2017-11-25 MED ORDER — LIDOCAINE-PRILOCAINE 2.5-2.5 % EX CREA: 1.0000 "application " | TOPICAL_CREAM | CUTANEOUS | 4 refills | Status: DC | PRN

## 2017-11-25 MED ORDER — SODIUM CHLORIDE 0.9% FLUSH
10.0000 mL | INTRAVENOUS | Status: DC | PRN
  Administered 2017-11-25: 10 mL
  Filled 2017-11-25: qty 10

## 2017-11-25 MED ORDER — ATEZOLIZUMAB CHEMO INJECTION 1200 MG/20ML
1200.0000 mg | Freq: Once | INTRAVENOUS | Status: AC
  Administered 2017-11-25: 1200 mg via INTRAVENOUS
  Filled 2017-11-25: qty 20

## 2017-11-25 MED ORDER — HEPARIN SOD (PORK) LOCK FLUSH 100 UNIT/ML IV SOLN
500.0000 [IU] | Freq: Once | INTRAVENOUS | Status: AC | PRN
Start: 2017-11-25 — End: 2017-11-25
  Administered 2017-11-25: 500 [IU]
  Filled 2017-11-25: qty 5

## 2017-11-25 MED ORDER — SODIUM CHLORIDE 0.9 % IV SOLN
Freq: Once | INTRAVENOUS | Status: AC
  Administered 2017-11-25: 14:00:00 via INTRAVENOUS

## 2017-11-25 NOTE — Patient Instructions (Signed)
[facility] Discharge Instructions for Patients Receiving Chemotherapy  Today you received the following chemotherapy agents:  Tecentriq  To help prevent nausea and vomiting after your treatment, we encourage you to take your nausea medication as prescribed.   If you develop nausea and vomiting that is not controlled by your nausea medication, call the clinic.   BELOW ARE SYMPTOMS THAT SHOULD BE REPORTED IMMEDIATELY:  *FEVER GREATER THAN 100.5 F  *CHILLS WITH OR WITHOUT FEVER  NAUSEA AND VOMITING THAT IS NOT CONTROLLED WITH YOUR NAUSEA MEDICATION  *UNUSUAL SHORTNESS OF BREATH  *UNUSUAL BRUISING OR BLEEDING  TENDERNESS IN MOUTH AND THROAT WITH OR WITHOUT PRESENCE OF ULCERS  *URINARY PROBLEMS  *BOWEL PROBLEMS  UNUSUAL RASH Items with * indicate a potential emergency and should be followed up as soon as possible.  Feel free to call the clinic should you have any questions or concerns. The clinic phone number is [phone].  Please show the [facility] at check-in to the Emergency Department and triage nurse.

## 2017-11-25 NOTE — Telephone Encounter (Signed)
-----   Message from Curt Bears, MD sent at 11/25/2017  1:38 PM EDT ----- Rx K Cl 20 meq po qd x 7 days ----- Message ----- From: Interface, Lab In [place] Sent: 11/25/2017  12:50 PM To: Curt Bears, MD

## 2017-11-25 NOTE — Progress Notes (Signed)
Per Dr Julien Nordmann  OK for treatment with CRT of 201

## 2017-11-27 ENCOUNTER — Telehealth: Payer: Self-pay | Admitting: *Deleted

## 2017-11-27 NOTE — Telephone Encounter (Signed)
Called pt to verify Rx for K+ was picked up. Pt confirmed picked up and has been taking K+. No further concerns.

## 2017-12-01 ENCOUNTER — Other Ambulatory Visit: Payer: Self-pay | Admitting: Internal Medicine

## 2017-12-02 ENCOUNTER — Other Ambulatory Visit: Payer: Medicare Other

## 2017-12-09 ENCOUNTER — Other Ambulatory Visit: Payer: Medicare Other

## 2017-12-09 ENCOUNTER — Ambulatory Visit: Payer: Medicare Other | Admitting: Internal Medicine

## 2017-12-09 ENCOUNTER — Ambulatory Visit: Payer: Medicare Other

## 2017-12-10 ENCOUNTER — Ambulatory Visit: Payer: Medicare Other

## 2017-12-11 ENCOUNTER — Ambulatory Visit: Payer: Medicare Other

## 2017-12-12 ENCOUNTER — Ambulatory Visit: Payer: Medicare Other

## 2017-12-14 ENCOUNTER — Telehealth: Payer: Self-pay | Admitting: Medical Oncology

## 2017-12-14 NOTE — Telephone Encounter (Signed)
appt confirmed

## 2017-12-14 NOTE — Telephone Encounter (Signed)
LVM about appt this week.

## 2017-12-16 ENCOUNTER — Encounter: Payer: Self-pay | Admitting: Oncology

## 2017-12-16 ENCOUNTER — Inpatient Hospital Stay: Payer: BLUE CROSS/BLUE SHIELD | Admitting: Nutrition

## 2017-12-16 ENCOUNTER — Inpatient Hospital Stay: Payer: BLUE CROSS/BLUE SHIELD

## 2017-12-16 ENCOUNTER — Inpatient Hospital Stay: Payer: BLUE CROSS/BLUE SHIELD | Attending: Internal Medicine

## 2017-12-16 ENCOUNTER — Inpatient Hospital Stay (HOSPITAL_BASED_OUTPATIENT_CLINIC_OR_DEPARTMENT_OTHER): Payer: BLUE CROSS/BLUE SHIELD | Admitting: Oncology

## 2017-12-16 ENCOUNTER — Telehealth: Payer: Self-pay | Admitting: Internal Medicine

## 2017-12-16 ENCOUNTER — Other Ambulatory Visit: Payer: Medicare Other

## 2017-12-16 VITALS — BP 109/66 | HR 88 | Temp 98.3°F | Resp 17 | Ht 73.0 in | Wt 162.6 lb

## 2017-12-16 DIAGNOSIS — Z5112 Encounter for antineoplastic immunotherapy: Secondary | ICD-10-CM | POA: Insufficient documentation

## 2017-12-16 DIAGNOSIS — Z452 Encounter for adjustment and management of vascular access device: Secondary | ICD-10-CM | POA: Diagnosis not present

## 2017-12-16 DIAGNOSIS — C7889 Secondary malignant neoplasm of other digestive organs: Secondary | ICD-10-CM | POA: Insufficient documentation

## 2017-12-16 DIAGNOSIS — D61810 Antineoplastic chemotherapy induced pancytopenia: Secondary | ICD-10-CM | POA: Insufficient documentation

## 2017-12-16 DIAGNOSIS — J181 Lobar pneumonia, unspecified organism: Secondary | ICD-10-CM | POA: Diagnosis not present

## 2017-12-16 DIAGNOSIS — C3412 Malignant neoplasm of upper lobe, left bronchus or lung: Secondary | ICD-10-CM

## 2017-12-16 DIAGNOSIS — C3492 Malignant neoplasm of unspecified part of left bronchus or lung: Secondary | ICD-10-CM

## 2017-12-16 DIAGNOSIS — E876 Hypokalemia: Secondary | ICD-10-CM

## 2017-12-16 DIAGNOSIS — K5900 Constipation, unspecified: Secondary | ICD-10-CM | POA: Diagnosis not present

## 2017-12-16 DIAGNOSIS — Z713 Dietary counseling and surveillance: Secondary | ICD-10-CM

## 2017-12-16 LAB — CMP (CANCER CENTER ONLY)
ALT: 22 U/L (ref 0–55)
ANION GAP: 7 (ref 3–11)
AST: 15 U/L (ref 5–34)
Albumin: 3.8 g/dL (ref 3.5–5.0)
Alkaline Phosphatase: 87 U/L (ref 40–150)
BILIRUBIN TOTAL: 0.4 mg/dL (ref 0.2–1.2)
BUN: 25 mg/dL (ref 7–26)
CALCIUM: 9.5 mg/dL (ref 8.4–10.4)
CO2: 28 mmol/L (ref 22–29)
Chloride: 103 mmol/L (ref 98–109)
Creatinine: 2.08 mg/dL — ABNORMAL HIGH (ref 0.70–1.30)
GFR, Est AFR Am: 37 mL/min — ABNORMAL LOW (ref >60)
GFR, Estimated: 32 mL/min — ABNORMAL LOW (ref >60)
GLUCOSE: 162 mg/dL — AB (ref 70–140)
POTASSIUM: 3.7 mmol/L (ref 3.5–5.1)
SODIUM: 138 mmol/L (ref 136–145)
TOTAL PROTEIN: 7 g/dL (ref 6.4–8.3)

## 2017-12-16 LAB — CBC WITH DIFFERENTIAL (CANCER CENTER ONLY)
BASOS ABS: 0 10*3/uL (ref 0.0–0.1)
BASOS PCT: 1 %
Eosinophils Absolute: 0.1 10*3/uL (ref 0.0–0.5)
Eosinophils Relative: 2 %
HEMATOCRIT: 29.4 % — AB (ref 38.4–49.9)
Hemoglobin: 9.5 g/dL — ABNORMAL LOW (ref 13.0–17.1)
LYMPHS PCT: 22 %
Lymphs Abs: 1.4 10*3/uL (ref 0.9–3.3)
MCH: 32.3 pg (ref 27.2–33.4)
MCHC: 32.3 g/dL (ref 32.0–36.0)
MCV: 100 fL — AB (ref 79.3–98.0)
MONO ABS: 0.5 10*3/uL (ref 0.1–0.9)
Monocytes Relative: 7 %
NEUTROS ABS: 4.4 10*3/uL (ref 1.5–6.5)
NEUTROS PCT: 68 %
PLATELETS: 116 10*3/uL — AB (ref 140–400)
RBC: 2.94 MIL/uL — ABNORMAL LOW (ref 4.20–5.82)
RDW: 19.8 % — AB (ref 11.0–14.6)
WBC Count: 6.4 10*3/uL (ref 4.0–10.3)

## 2017-12-16 LAB — TSH: TSH: 0.9 u[IU]/mL (ref 0.320–4.118)

## 2017-12-16 MED ORDER — SODIUM CHLORIDE 0.9 % IV SOLN
Freq: Once | INTRAVENOUS | Status: AC
  Administered 2017-12-16: 15:00:00 via INTRAVENOUS

## 2017-12-16 MED ORDER — SODIUM CHLORIDE 0.9 % IV SOLN
1200.0000 mg | Freq: Once | INTRAVENOUS | Status: AC
  Administered 2017-12-16: 1200 mg via INTRAVENOUS
  Filled 2017-12-16: qty 20

## 2017-12-16 MED ORDER — HEPARIN SOD (PORK) LOCK FLUSH 100 UNIT/ML IV SOLN
500.0000 [IU] | Freq: Once | INTRAVENOUS | Status: AC | PRN
  Administered 2017-12-16: 500 [IU]
  Filled 2017-12-16: qty 5

## 2017-12-16 MED ORDER — SODIUM CHLORIDE 0.9% FLUSH
10.0000 mL | INTRAVENOUS | Status: DC | PRN
  Administered 2017-12-16: 10 mL
  Filled 2017-12-16: qty 10

## 2017-12-16 MED ORDER — LACTULOSE 10 GM/15ML PO SOLN: 20.0000 g | Freq: Every day | ORAL | 0 refills | Status: DC | PRN

## 2017-12-16 NOTE — Telephone Encounter (Signed)
Appointments scheduled AVS/Calendar printed per 5/15 los

## 2017-12-16 NOTE — Assessment & Plan Note (Signed)
This is a very pleasant 67 year old white male with extensive stage small cell lung cancer completed 6 cycles of systemic chemotherapy with carboplatin and etoposide concurrent with radiation to the large left upper lobe mass.  The patient tolerated the treatment well except for the chemotherapy-induced pancytopenia and significant anemia. This was followed by prophylactic cranial irradiation. The patient was on observation for almost 9 months. Repeat CT scan of the chest, abdomen and pelvis showed no concerning findings for disease recurrence in the chest but the patient has new retroperitoneal mass between the pancreatic head and transverse duodenum. He underwent endoscopic ultrasound and biopsy of the pancreatic mass and the final pathology was consistent with recurrent small cell carcinoma. He was started on treatment with carboplatin for AUC of 5 on day 1, etoposide 100 mg/M2 on days 1, 2 and 3 as well as a Tecentriq (Atezolizumab) 1200 mg IV every 3 weeks with Neulasta support.  Status post 1 cycle.  He had difficulty with worsening of his fatigue, decreased appetite, dehydration, and anemia following his first cycle of chemotherapy. He was admitted to the hospital for dehydration and anemia. Due to his poor tolerance, the chemotherapy was discontinued and he was continued on Tecentriq only. Status post 1 cycle of Tecentriq as a single agent which he tolerated fairly well with no concerning complaints.  The patient was seen with Dr. Julien Nordmann.    Labs reviewed and are adequate for treatment.  His creatinine remained stable at 2.08. This is okay to proceed with treatment.  Recommend for him to proceed with cycle 2 of single agent Tecentriq as scheduled today. He will follow-up in 3 weeks for evaluation prior to cycle #3.  For constipation, he was given a prescription for lactulose to use daily as needed.  He was advised to call immediately if he has any concerning symptoms in the interval. The  patient voices understanding of current disease status and treatment options and is in agreement with the current care plan. All questions were answered. The patient knows to call the clinic with any problems,

## 2017-12-16 NOTE — Progress Notes (Signed)
[facility] OFFICE PROGRESS NOTE  [name], MD [place] [address] [place] [address]  DIAGNOSIS: Extensive stage (T4, N2, M1b) small cell lung cancer presented with large left upper lobe lung mass with mediastinal invasion as well as pancreatic lesion and suspicious left iliac bone metastasis diagnosed in February 2018.  PRIOR THERAPY: 1) Systemic chemotherapy with carboplatin for AUC of 5 on day 1 and etoposide 100 MG/M2 on days 1, 2 and 3 with Neulasta support status post 6 cycles.Last dose of chemotherapy was given January 14, 2017 with partial response. This is concurrent with radiotherapy. This also concurrent with radiation to the large lung mass. 2) prophylactic cranial irradiation under the care of Dr. Sondra Come. 3) Systemic chemotherapy with carboplatin for AUC of 5 on day 1, etoposide 100 mg/M2 on days 1, 2 and 3, Tecentriq (Atezolizumab) 1200 mg IV every 3 weeks with Neulasta support. First dose October 28, 2017.  Status post 1 cycle.  Carboplatin and etoposide were stopped due to intolerance.  CURRENT THERAPY: Tecentriq 1200 mg IV every 3 weeks as a single agent.   First cycle was started on 11/25/2017.  Status post 1 cycle  INTERVAL HISTORY: Jesse Avila 67 y.o. male returns for routine follow-up visit by himself.  The patient is feeling fine today and has no specific complaints except for constipation.  He has been using MiraLAX which is not fully effective.  The patient denies fevers and chills.  Denies chest pain, shortness of breath, cough, hemoptysis.  Denies nausea, vomiting, diarrhea.  Denies skin rashes.  The patient is eating better and has gained back 4 pounds.  He tolerated his Tecentriq fairly well.  The patient is here for evaluation prior to cycle #2.  MEDICAL HISTORY: Past Medical History:  Diagnosis Date  . Atrial fibrillation ([place])   . Carotid artery occlusion   . DJD (degenerative joint disease) of knee   . Essential hypertension,  benign    takes Benicar daily  . Goals of care, counseling/discussion 09/18/2016  . History of colon polyps    benign  . History of gout   . History of radiation therapy 09/30/16-11/10/16   left lung 60 Gy in 30 fractions  . Hyperlipidemia    takes Fenofibrate and Crestor daily  . Insomnia    takes Ambien nightly as needed  . Joint pain   . Lung cancer ([place])   . Nocturia   . Peripheral vascular disease ([place])   . Primary localized osteoarthritis of left knee 12/26/2014  . Stroke [facility]) 2015?   takes Plavix daily as well as Pletal,not on plavix at present 05/15/15  . 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  MEDICATIONS:  Current Outpatient Medications  Medication Sig Dispense Refill  . amiodarone (PACERONE) 200 MG tablet Take 1 tablet (200 mg total) by mouth daily. 90 tablet 0  . dronabinol (MARINOL) 2.5 MG capsule Take 1 capsule (2.5 mg total) by mouth 2 (two) times daily before a meal. 60 capsule 0  . furosemide (LASIX) 40 MG tablet TAKE 1 TABLET BY MOUTH TWICE A DAY (Patient taking differently: TAKE 40 MG BY MOUTH TWICE A DAY) 60 tablet 6  . HYDROcodone-acetaminophen (NORCO) 5-325 MG tablet Take 1 tablet by mouth every 6 (six) hours as needed for moderate pain. 30 tablet 0  . insulin glargine (  LANTUS) 100 UNIT/ML injection Inject 0.1 mLs (10 Units total) into the skin daily. (Patient taking differently: Inject 15 Units into the skin at bedtime. ) 10 mL 11  . KLOR-CON M20 20 MEQ tablet TAKE 1/2 TABLET (10MEQ) BY MOUTH DAILY. 10 tablet 0  . lidocaine (XYLOCAINE) 2 % solution Use as directed 15 mLs in the mouth or throat 3 (three) times daily before meals. 100 mL 0  . lidocaine-prilocaine (EMLA) cream Apply 1 application topically as needed. 30 g 4  . OVER THE COUNTER MEDICATION Take 1 capsule by mouth daily. Hemp Extract  Supplement    . pantoprazole (PROTONIX) 40 MG tablet Take 1 tablet (40 mg total) by mouth daily. 30 tablet 0  . prochlorperazine (COMPAZINE) 10 MG tablet Take 1 tablet (10 mg total) by mouth every 6 (six) hours as needed for nausea or vomiting. 30 tablet 0  . spironolactone (ALDACTONE) 25 MG tablet Take 1 tablet (25 mg total) by mouth daily. 30 tablet 0  . sucralfate (CARAFATE) 1 GM/10ML suspension Take 10 mLs (1 g total) by mouth 4 (four) times daily - after meals and at bedtime. 420 mL 0  . zolpidem (AMBIEN) 10 MG tablet Take 10 mg by mouth at bedtime.     Marland Kitchen lactulose (CHRONULAC) 10 GM/15ML solution Take 30 mLs (20 g total) by mouth daily as needed for mild constipation or moderate constipation. 480 mL 0  . pantoprazole (PROTONIX) 40 MG tablet Take 1 tablet (40 mg total) by mouth 2 (two) times daily for 15 days. 30 tablet 0  . potassium chloride SA (KLOR-CON M20) 20 MEQ tablet Take 1 tablet (20 mEq total) by mouth daily for 7 days. 7 tablet 0   No current facility-administered medications for this visit.    Facility-Administered Medications Ordered in Other Visits  Medication Dose Route Frequency Provider Last Rate Last Dose  . sodium chloride flush (NS) 0.9 % injection 10 mL  10 mL Intracatheter PRN Curt Bears, MD   10 mL at 12/16/17 1555    SURGICAL HISTORY:  Past Surgical History:  Procedure Laterality Date  . ABDOMINAL AORTAGRAM N/A 03/29/2012   Procedure: ABDOMINAL Maxcine Ham;  Surgeon: Angelia Mould, MD;  Location: [facility] CATH LAB;  Service: Cardiovascular;  Laterality: N/A;  . CARDIOVERSION N/A 05/08/2017   Procedure: CARDIOVERSION;  Surgeon: Minna Merritts, MD;  Location: [facility] ORS;  Service: Cardiovascular;  Laterality: N/A;  . COLONOSCOPY    . ENDARTERECTOMY Right 05/09/2014   Procedure: RIGHT CAROTID ENDARTERECTOMY WITH PATCH ANGIOPLASTY;  Surgeon: Conrad La Follette, MD;  Location: [facility];  Service: Vascular;  Laterality: Right;  . ENDARTERECTOMY FEMORAL Right 05/17/2015    Procedure: ENDARTERECTOMY FEMORAL;  Surgeon: Angelia Mould, MD;  Location: [facility];  Service: Vascular;  Laterality: Right;  . ENDOBRONCHIAL ULTRASOUND Bilateral 09/08/2016   Procedure: ENDOBRONCHIAL ULTRASOUND;  Surgeon: Rigoberto Noel, MD;  Location: [facility] ENDOSCOPY;  Service: Cardiopulmonary;  Laterality: Bilateral;  . EUS N/A 10/08/2017   Procedure: UPPER ENDOSCOPIC ULTRASOUND (EUS) LINEAR;  Surgeon: [name], MD;  Location: [facility] ENDOSCOPY;  Service: Endoscopy;  Laterality: N/A;  . FEMORAL-POPLITEAL BYPASS GRAFT Right 05/17/2015   Procedure: BYPASS GRAFT FEMORAL-BELOW KNEE POPLITEAL ARTERY, using gortex propaten graft 6 mm x 80 cm;  Surgeon: Angelia Mould, MD;  Location: [place];  Service: Vascular;  Laterality: Right;  . IR GENERIC HISTORICAL  09/25/2016   IR FLUORO GUIDE PORT INSERTION RIGHT 09/25/2016 Markus Daft, MD [facility]-INTERV RAD  . IR GENERIC HISTORICAL  09/25/2016  IR US GUIDE VASC ACCESS RIGHT 09/25/2016 Markus Daft, MD [facility]INTER[facility]  . KNEE ARTHROSCOPY Right 11/2009  . LOWER EXTREMITY ANGIOGRAM Bilateral 03/23/2015   Procedure: Lower Extremity Angiogram;  Surgeon: Angelia Mould, MD;  Location: [facility] CV LAB;  Service: Cardiovascular;  Laterality: Bilateral;  . MENISCUS REPAIR  11/2009  . PARTIAL KNEE ARTHROPLASTY Left 12/26/2014   Procedure: UNICOMPARTMENTAL KNEE;  Surgeon: Marchia Bond, MD;  Location: [facility];  Service: Orthopedics;  Laterality: Left;  . PATCH ANGIOPLASTY Right 05/17/2015   Procedure: VEIN PATCH ANGIOPLASTY ILEOFEMORAL ARTERY;  Surgeon: Angelia Mould, MD;  Location: [place];  Service: Vascular;  Laterality: Right;  . PERCUTANEOUS STENT INTERVENTION N/A 05/10/2012   Procedure: PERCUTANEOUS STENT INTERVENTION;  Surgeon: Angelia Mould, MD;  Location: [facility] CATH LAB;  Service: Cardiovascular;  Laterality: N/A;  . PERIPHERAL VASCULAR CATHETERIZATION N/A 03/23/2015   Procedure: Abdominal Aortogram;  Surgeon: Angelia Mould, MD;  Location: [facility] CV LAB;  Service: Cardiovascular;  Laterality: N/A;  . STENTS     PLACED IN ??BOTH LEGS   2013?  . TEE WITHOUT CARDIOVERSION N/A 03/06/2017   Procedure: TRANSESOPHAGEAL ECHOCARDIOGRAM (TEE);  Surgeon: Larey Dresser, MD;  Location: [facility] ENDOSCOPY;  Service: Cardiovascular;  Laterality: N/A;  . TEE WITHOUT CARDIOVERSION N/A 05/08/2017   Procedure: TRANSESOPHAGEAL ECHOCARDIOGRAM (TEE);  Surgeon: Minna Merritts, MD;  Location: [facility] ORS;  Service: Cardiovascular;  Laterality: N/A;    REVIEW OF SYSTEMS:   Review of Systems  Constitutional: Negative for appetite change, chills, fatigue, fever and unexpected weight change.  HENT:   Negative for mouth sores, nosebleeds, sore throat and trouble swallowing.   Eyes: Negative for eye problems and icterus.  Respiratory: Negative for cough, hemoptysis, shortness of breath and wheezing.   Cardiovascular: Negative for chest pain and leg swelling.  Gastrointestinal: Negative for abdominal pain, diarrhea, nausea and vomiting. Positive for constipation. Genitourinary: Negative for bladder incontinence, difficulty urinating, dysuria, frequency and hematuria.   Musculoskeletal: Negative for back pain, gait problem, neck pain and neck stiffness.  Skin: Negative for itching and rash.  Neurological: Negative for dizziness, extremity weakness, gait problem, headaches, light-headedness and seizures.  Hematological: Negative for adenopathy. Does not bruise/bleed easily.  Psychiatric/Behavioral: Negative for confusion, depression and sleep disturbance. The patient is not nervous/anxious.     PHYSICAL EXAMINATION:  Blood pressure 109/66, pulse 88, temperature 98.3 F (36.8 C), temperature source Oral, resp. rate 17, height 6\' 1"  (1.854 m), weight 162 lb 9.6 oz (73.8 kg), SpO2 100 %.  ECOG PERFORMANCE STATUS: 1 - Symptomatic but completely ambulatory  Physical Exam  Constitutional: Oriented to person, place, and time and well-developed, well-nourished,  and in no distress. No distress.  HENT:  Head: Normocephalic and atraumatic.  Mouth/Throat: Oropharynx is clear and moist. No oropharyngeal exudate.  Eyes: Conjunctivae are normal. Right eye exhibits no discharge. Left eye exhibits no discharge. No scleral icterus.  Neck: Normal range of motion. Neck supple.  Cardiovascular: Normal rate, regular rhythm, normal heart sounds and intact distal pulses.   Pulmonary/Chest: Effort normal and breath sounds normal. No respiratory distress. No wheezes. No rales.  Abdominal: Soft. Bowel sounds are normal. Exhibits no distension and no mass. There is no tenderness.  Musculoskeletal: Normal range of motion. Exhibits no edema.  Lymphadenopathy:    No cervical adenopathy.  Neurological: Alert and oriented to person, place, and time. Exhibits normal muscle tone. Gait normal. Coordination normal.  Skin: Skin is warm and dry. No rash noted. Not diaphoretic. No  erythema. No pallor.  Psychiatric: Mood, memory and judgment normal.  Vitals reviewed.  LABORATORY DATA: Lab Results  Component Value Date   WBC 6.4 12/16/2017   HGB 9.5 (L) 12/16/2017   HCT 29.4 (L) 12/16/2017   MCV 100.0 (H) 12/16/2017   PLT 116 (L) 12/16/2017      Chemistry      Component Value Date/Time   NA 138 12/16/2017 1327   NA 137 06/19/2017 0922   K 3.7 12/16/2017 1327   K 4.1 06/19/2017 0922   CL 103 12/16/2017 1327   CO2 28 12/16/2017 1327   CO2 28 06/19/2017 0922   BUN 25 12/16/2017 1327   BUN 12.6 06/19/2017 0922   CREATININE 2.08 (H) 12/16/2017 1327   CREATININE 1.3 06/19/2017 0922      Component Value Date/Time   CALCIUM 9.5 12/16/2017 1327   CALCIUM 9.6 06/19/2017 0922   ALKPHOS 87 12/16/2017 1327   ALKPHOS 116 06/19/2017 0922   AST 15 12/16/2017 1327   AST 15 06/19/2017 0922   ALT 22 12/16/2017 1327   ALT 14 06/19/2017 0922   BILITOT 0.4 12/16/2017 1327   BILITOT 0.56 06/19/2017 0922       RADIOGRAPHIC STUDIES:  No results found.   ASSESSMENT/PLAN:   Small cell lung carcinoma, left (HCC) This is a very pleasant 67 year old white male with extensive stage small cell lung cancer completed 6 cycles of systemic chemotherapy with carboplatin and etoposide concurrent with radiation to the large left upper lobe mass.  The patient tolerated the treatment well except for the chemotherapy-induced pancytopenia and significant anemia. This was followed by prophylactic cranial irradiation. The patient was on observation for almost 9 months. Repeat CT scan of the chest, abdomen and pelvis showed no concerning findings for disease recurrence in the chest but the patient has new retroperitoneal mass between the pancreatic head and transverse duodenum. He underwent endoscopic ultrasound and biopsy of the pancreatic mass and the final pathology was consistent with recurrent small cell carcinoma. He was started on treatment with carboplatin for AUC of 5 on day 1, etoposide 100 mg/M2 on days 1, 2 and 3 as well as a Tecentriq (Atezolizumab) 1200 mg IV every 3 weeks with Neulasta support.  Status post 1 cycle.  He had difficulty with worsening of his fatigue, decreased appetite, dehydration, and anemia following his first cycle of chemotherapy. He was admitted to the hospital for dehydration and anemia. Due to his poor tolerance, the chemotherapy was discontinued and he was continued on Tecentriq only. Status post 1 cycle of Tecentriq as a single agent which he tolerated fairly well with no concerning complaints.  The patient was seen with Dr. Julien Nordmann.    Labs reviewed and are adequate for treatment.  His creatinine remained stable at 2.08. This is okay to proceed with treatment.  Recommend for him to proceed with cycle 2 of single agent Tecentriq as scheduled today. He will follow-up in 3 weeks for evaluation prior to cycle #3.  For constipation, he was given a prescription for lactulose to use daily as needed.  He was advised to call immediately if he has any  concerning symptoms in the interval. The patient voices understanding of current disease status and treatment options and is in agreement with the current care plan. All questions were answered. The patient knows to call the clinic with any problems,    No orders of the defined types were placed in this encounter.  Mikey Bussing, DNP, AGPCNP-BC, AOCNP 12/16/17  ADDENDUM: Hematology/Oncology Attending: I had a face-to-face encounter with the patient today.  I recommended his care plan.  This is a very pleasant 67 years old white male with metastatic small cell lung cancer.  He was a started on a second regimen of chemotherapy with carboplatin, etoposide and Tecentriq but unfortunately was unable to tolerate the chemotherapy which was discontinued.  He is currently undergoing treatment with single agent Tecentriq every 3 weeks status post 1 cycle.  He tolerated this treatment much better with no concerning complaints.  He gained few pounds since his last visit.  He denied having any significant fatigue or weakness.  He denied having any chest pain, shortness breath, cough or hemoptysis. I recommended for the patient to proceed with cycle #2 of Tecentriq today. We will see him back for follow-up visit in 3 weeks for evaluation before starting the next cycle of his treatment. For constipation he was given prescription for lactulose. The patient was advised to call immediately if he has any concerning symptoms in the interval.  Disclaimer: This note was dictated with voice recognition software. Similar sounding words can inadvertently be transcribed and may be missed upon review. Eilleen Kempf, MD 12/16/17

## 2017-12-16 NOTE — Patient Instructions (Signed)
[facility] Discharge Instructions for Patients Receiving Chemotherapy  Today you received the following chemotherapy agents: Tecentriq  To help prevent nausea and vomiting after your treatment, we encourage you to take your nausea medication as directed.   If you develop nausea and vomiting that is not controlled by your nausea medication, call the clinic.   BELOW ARE SYMPTOMS THAT SHOULD BE REPORTED IMMEDIATELY:  *FEVER GREATER THAN 100.5 F  *CHILLS WITH OR WITHOUT FEVER  NAUSEA AND VOMITING THAT IS NOT CONTROLLED WITH YOUR NAUSEA MEDICATION  *UNUSUAL SHORTNESS OF BREATH  *UNUSUAL BRUISING OR BLEEDING  TENDERNESS IN MOUTH AND THROAT WITH OR WITHOUT PRESENCE OF ULCERS  *URINARY PROBLEMS  *BOWEL PROBLEMS  UNUSUAL RASH Items with * indicate a potential emergency and should be followed up as soon as possible.  Feel free to call the clinic should you have any questions or concerns. The clinic phone number is [phone].  Please show the CHEMO ALERT CARD at check-in to the Emergency Department and triage nurse.   

## 2017-12-16 NOTE — Progress Notes (Signed)
67 year old male diagnosed with small cell lung cancer.  He is a patient of Dr. Julien Nordmann.  Past medical history includes diabetes type 2, stroke, PVD, hyperlipidemia, gout, DJD, and atrial fib.  Medications include Marinol, Lasix, Lantus, Protonix, Compazine, and Carafate.  Labs include sodium 135, potassium 3.5, glucose 163, and albumin 3.0 on April 17  Patient admits to poor appetite but states it has been improving. He states he does have some constipation. Reports he thinks he is eating more. He does not want to drink oral nutrition supplements. Nutrition focused physical exam was deferred.  Nutrition diagnosis:  Unintended weight loss related to small cell lung cancer as evidenced by 23 pound weight loss since February 2019.  Intervention: Educated patient to increase calories and protein in 6 small meals and snacks daily. Educated patient on high-protein high-calorie foods and gave examples. Reviewed strategies for improving constipation. Fact sheets were given.  Questions were answered.  Teach back method used.  Contact information provided.  Monitoring, evaluation, goals: Patient will tolerate increased calories and protein to minimize weight loss during treatment.  Next visit: To be scheduled as needed.  **Disclaimer: This note was dictated with voice recognition software. Similar sounding words can inadvertently be transcribed and this note may contain transcription errors which may not have been corrected upon publication of note.**

## 2017-12-20 ENCOUNTER — Other Ambulatory Visit: Payer: Self-pay | Admitting: Internal Medicine

## 2017-12-23 ENCOUNTER — Other Ambulatory Visit: Payer: Medicare Other

## 2017-12-29 ENCOUNTER — Inpatient Hospital Stay: Payer: BLUE CROSS/BLUE SHIELD

## 2017-12-29 ENCOUNTER — Telehealth: Payer: Self-pay | Admitting: Internal Medicine

## 2017-12-29 ENCOUNTER — Ambulatory Visit (HOSPITAL_COMMUNITY)
Admission: RE | Admit: 2017-12-29 | Discharge: 2017-12-29 | Disposition: A | Discharge status: Home / Self Care | Payer: BLUE CROSS/BLUE SHIELD | Source: Ambulatory Visit | Attending: Medical | Admitting: Medical

## 2017-12-29 ENCOUNTER — Ambulatory Visit: Payer: BLUE CROSS/BLUE SHIELD

## 2017-12-29 ENCOUNTER — Other Ambulatory Visit: Payer: Self-pay | Admitting: Medical Oncology

## 2017-12-29 ENCOUNTER — Inpatient Hospital Stay (HOSPITAL_BASED_OUTPATIENT_CLINIC_OR_DEPARTMENT_OTHER): Payer: BLUE CROSS/BLUE SHIELD | Admitting: Medical

## 2017-12-29 VITALS — BP 106/69 | HR 90 | Temp 97.5°F | Resp 18 | Ht 73.0 in | Wt 160.8 lb

## 2017-12-29 DIAGNOSIS — Z95828 Presence of other vascular implants and grafts: Secondary | ICD-10-CM

## 2017-12-29 DIAGNOSIS — C3402 Malignant neoplasm of left main bronchus: Secondary | ICD-10-CM

## 2017-12-29 DIAGNOSIS — J189 Pneumonia, unspecified organism: Secondary | ICD-10-CM

## 2017-12-29 DIAGNOSIS — C3412 Malignant neoplasm of upper lobe, left bronchus or lung: Secondary | ICD-10-CM

## 2017-12-29 DIAGNOSIS — C3492 Malignant neoplasm of unspecified part of left bronchus or lung: Secondary | ICD-10-CM

## 2017-12-29 DIAGNOSIS — J181 Lobar pneumonia, unspecified organism: Secondary | ICD-10-CM | POA: Insufficient documentation

## 2017-12-29 DIAGNOSIS — Z5112 Encounter for antineoplastic immunotherapy: Secondary | ICD-10-CM | POA: Diagnosis not present

## 2017-12-29 LAB — CMP (CANCER CENTER ONLY)
ALBUMIN: 3.2 g/dL — AB (ref 3.5–5.0)
ALT: 12 U/L (ref 0–55)
AST: 14 U/L (ref 5–34)
Alkaline Phosphatase: 72 U/L (ref 40–150)
Anion gap: 13 — ABNORMAL HIGH (ref 3–11)
BUN: 24 mg/dL (ref 7–26)
CHLORIDE: 98 mmol/L (ref 98–109)
CO2: 22 mmol/L (ref 22–29)
CREATININE: 1.49 mg/dL — AB (ref 0.70–1.30)
Calcium: 9.8 mg/dL (ref 8.4–10.4)
GFR, Est AFR Am: 55 mL/min — ABNORMAL LOW (ref >60)
GFR, Estimated: 48 mL/min — ABNORMAL LOW (ref >60)
Glucose, Bld: 119 mg/dL (ref 70–140)
POTASSIUM: 3.8 mmol/L (ref 3.5–5.1)
SODIUM: 133 mmol/L — AB (ref 136–145)
Total Bilirubin: 0.6 mg/dL (ref 0.2–1.2)
Total Protein: 7.4 g/dL (ref 6.4–8.3)

## 2017-12-29 LAB — CBC WITH DIFFERENTIAL (CANCER CENTER ONLY)
Basophils Absolute: 0 10*3/uL (ref 0.0–0.1)
Basophils Relative: 0 %
EOS ABS: 0 10*3/uL (ref 0.0–0.5)
Eosinophils Relative: 0 %
HCT: 27.2 % — ABNORMAL LOW (ref 38.4–49.9)
Hemoglobin: 9.2 g/dL — ABNORMAL LOW (ref 13.0–17.1)
LYMPHS ABS: 1.3 10*3/uL (ref 0.9–3.3)
LYMPHS PCT: 14 %
MCH: 33.5 pg — AB (ref 27.2–33.4)
MCHC: 34 g/dL (ref 32.0–36.0)
MCV: 98.3 fL — AB (ref 79.3–98.0)
MONOS PCT: 6 %
Monocytes Absolute: 0.5 10*3/uL (ref 0.1–0.9)
Neutro Abs: 7.4 10*3/uL — ABNORMAL HIGH (ref 1.5–6.5)
Neutrophils Relative %: 80 %
Platelet Count: 118 10*3/uL — ABNORMAL LOW (ref 140–400)
RBC: 2.76 MIL/uL — AB (ref 4.20–5.82)
RDW: 20.3 % — ABNORMAL HIGH (ref 11.0–14.6)
WBC Count: 9.2 10*3/uL (ref 4.0–10.3)

## 2017-12-29 LAB — SAMPLE TO BLOOD BANK

## 2017-12-29 MED ORDER — HEPARIN SOD (PORK) LOCK FLUSH 100 UNIT/ML IV SOLN
500.0000 [IU] | Freq: Once | INTRAVENOUS | Status: DC | PRN
  Filled 2017-12-29: qty 5

## 2017-12-29 MED ORDER — SODIUM CHLORIDE 0.9% FLUSH
10.0000 mL | INTRAVENOUS | Status: DC | PRN
  Administered 2017-12-29: 10 mL via INTRAVENOUS
  Filled 2017-12-29: qty 10

## 2017-12-29 MED ORDER — SODIUM CHLORIDE 0.9% FLUSH
10.0000 mL | INTRAVENOUS | Status: DC | PRN
  Filled 2017-12-29: qty 10

## 2017-12-29 MED ORDER — DOXYCYCLINE HYCLATE 100 MG PO TABS: 100.0000 mg | ORAL_TABLET | Freq: Two times a day (BID) | ORAL | 0 refills | Status: DC

## 2017-12-29 NOTE — Telephone Encounter (Signed)
Called pt to schedule lab/flush per 5/28 sch msg - spoke w/ pt re appts that were added.

## 2017-12-29 NOTE — Patient Instructions (Signed)

## 2017-12-29 NOTE — Telephone Encounter (Signed)
Pt called this am reporting light headed and when I stand up I almost fall. States  " I feel Funny". Per [facility] and possible fluids.

## 2017-12-30 ENCOUNTER — Other Ambulatory Visit: Payer: Medicare Other

## 2017-12-30 ENCOUNTER — Ambulatory Visit: Payer: Medicare Other | Admitting: Internal Medicine

## 2017-12-30 ENCOUNTER — Ambulatory Visit: Payer: Medicare Other

## 2017-12-30 NOTE — Progress Notes (Signed)
[facility] Progress Note   Jesse Avila [id number] 1951-04-11 67 y.o.  Jesse Avila is managed by Dr. Fanny Bien. Jesse Avila  Actively treated with chemotherapy/immunotherapy: yes  Current Therapy: atezolizumab  Last Treated:   12/16/2017 (cycle 3, day 1)  Assessment: Plan:    Port catheter in place - Plan: heparin lock flush 100 unit/mL, sodium chloride flush (NS) 0.9 % injection 10 mL  Malignant neoplasm of hilus of left lung ([place]) - Plan: heparin lock flush 100 unit/mL, sodium chloride flush (NS) 0.9 % injection 10 mL  Pneumonia of right lower lobe due to infectious organism ([place]) - Plan: DG Chest 2 View, doxycycline (VIBRA-TABS) 100 MG tablet   Malignant neoplasm of the hilus of the left lung: [name] is managed by Dr. Julien Nordmann and is status post cycle 3, day 1 of atezolizumab which was dosed on 12/16/2017.  He is scheduled to see Dr. Julien Nordmann in follow-up on 01/06/2018.  Pneumonia of the right lower lobe: [name] was referred for a chest x-ray which returned showing a pneumonia of the right lower lobe.  His vital signs were stable with his CBC returning at 9.2 with an Experiment of 7.4.  He was given a prescription for doxycycline 100 mg p.o. twice daily x7 days.  Please see After Visit Summary for patient specific instructions.  Future Appointments  Date Time Provider [place]  01/06/2018  8:15 AM [facility] LAB 2 [facility] None  01/06/2018  8:30 AM [facility] I27 DNS [facility] None  01/06/2018  8:45 AM Curt Bears, MD [facility] None  01/06/2018  9:30 AM [facility] E16 [facility] None  01/06/2018  1:00 PM [facility] VASC 6 - PS [facility] VVS  01/06/2018  2:00 PM [facility] PA [facility] VVS  01/27/2018 12:45 PM [facility] LAB 6 [facility] None  01/27/2018  1:00 PM [facility] I27 DNS [facility] None  01/27/2018  1:30 PM Curcio, Kristin R, NP [facility] None  01/27/2018  2:30 PM [facility] B6 [facility] None  02/17/2018  9:15 AM [facility] LAB 3  [facility] None  02/17/2018  9:30 AM [facility] FLUSH NURSE [facility] None  02/17/2018 10:00 AM Curcio, Roselie Awkward, NP [facility] None  02/17/2018 11:00 AM [facility] B4 [facility] None  03/08/2018  2:00 PM Parrett, Tammy S, NP [facility] None  03/10/2018  9:15 AM [facility] LAB 2 [facility] None  03/10/2018  9:30 AM [facility] FLUSH NURSE [facility] None  03/10/2018 10:00 AM Curcio, Kristin R, NP [facility] None  03/10/2018 11:00 AM [facility] B5 [facility] None  03/31/2018  9:15 AM [facility] LAB 4 [facility] None  03/31/2018  9:30 AM [facility] FLUSH NURSE [facility] None  03/31/2018 10:00 AM Curcio, Roselie Awkward, NP [facility] None  03/31/2018 11:00 AM [facility] E15 [facility] None    Orders Placed This Encounter  Procedures  . DG Chest 2 View       Subjective:   Patient ID:  Jesse Avila is a 67 y.o. (DOB 03-06-1951) male.  Chief Complaint:  Chief Complaint  Patient presents with  . Dizziness    HPI Jesse Avila is a 67 year old male with a history of an extensive stage (T4, N2, M1b) small cell lung cancer who is managed by Dr. Earlie Server and has most recently been treated with cycle 3, day 1 of atezolizumab which was dosed on 12/16/2017.  He presents to the office today with a 2-day history of dizziness and shortness of breath with positional changes.  He fell at home 2 days ago after rising from a seated position after  becoming dizzy.  He hit the back of his head and buttock but denies any injury.  He did not seek medical attention at that time.  He has been having chills.  He self diagnosed himself with a sinus infection and treated himself with over-the-counter Allegra-D.  He reports that his wife has recently been diagnosed with pneumonia.  He denies fevers, sweats, nausea, vomiting, diarrhea, constipation, or pain.  Medications: I have reviewed the patient's current medications.  Allergies:  Allergies  Allergen Reactions  . Cialis [Tadalafil] Other (See Comments)     Headache     Past Medical History:  Diagnosis Date  . Atrial fibrillation ([place])   . Carotid artery occlusion   . DJD (degenerative joint disease) of knee   . Essential hypertension, benign    takes Benicar daily  . Goals of care, counseling/discussion 09/18/2016  . History of colon polyps    benign  . History of gout   . History of radiation therapy 09/30/16-11/10/16   left lung 60 Gy in 30 fractions  . Hyperlipidemia    takes Fenofibrate and Crestor daily  . Insomnia    takes Ambien nightly as needed  . Joint pain   . Lung cancer ([place])   . Nocturia   . Peripheral vascular disease ([place])   . Primary localized osteoarthritis of left knee 12/26/2014  . Stroke [facility]) 2015?   takes Plavix daily as well as Pletal,not on plavix at present 05/15/15  . 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 ABDOMINAL AORTAGRAM N/A 03/29/2012   Procedure: ABDOMINAL Maxcine Ham;  Surgeon: Angelia Mould, MD;  Location: [facility] CATH LAB;  Service: Cardiovascular;  Laterality: N/A;  . CARDIOVERSION N/A 05/08/2017   Procedure: CARDIOVERSION;  Surgeon: Minna Merritts, MD;  Location: [facility] ORS;  Service: Cardiovascular;  Laterality: N/A;  . COLONOSCOPY    . ENDARTERECTOMY Right 05/09/2014   Procedure: RIGHT CAROTID ENDARTERECTOMY WITH PATCH ANGIOPLASTY;  Surgeon: Conrad , MD;  Location: [place];  Service: Vascular;  Laterality: Right;  . ENDARTERECTOMY FEMORAL Right 05/17/2015   Procedure: ENDARTERECTOMY FEMORAL;  Surgeon: Angelia Mould, MD;  Location: [place];  Service: Vascular;  Laterality: Right;  . ENDOBRONCHIAL ULTRASOUND Bilateral 09/08/2016   Procedure: ENDOBRONCHIAL ULTRASOUND;  Surgeon: Rigoberto Noel, MD;  Location: [facility] ENDOSCOPY;  Service: Cardiopulmonary;  Laterality: Bilateral;  . EUS N/A 10/08/2017   Procedure: UPPER  ENDOSCOPIC ULTRASOUND (EUS) LINEAR;  Surgeon: [name], MD;  Location: [facility] ENDOSCOPY;  Service: Endoscopy;  Laterality: N/A;  . FEMORAL-POPLITEAL BYPASS GRAFT Right 05/17/2015   Procedure: BYPASS GRAFT FEMORAL-BELOW KNEE POPLITEAL ARTERY, using gortex propaten graft 6 mm x 80 cm;  Surgeon: Angelia Mould, MD;  Location: [place];  Service: Vascular;  Laterality: Right;  . IR GENERIC HISTORICAL  09/25/2016   IR FLUORO GUIDE PORT INSERTION RIGHT 09/25/2016 Markus Daft, MD [facility]  . IR GENERIC HISTORICAL  09/25/2016   IR US GUIDE VASC ACCESS RIGHT 09/25/2016 Markus Daft, MD [facility]  . KNEE ARTHROSCOPY Right 11/2009  . LOWER EXTREMITY ANGIOGRAM Bilateral 03/23/2015   Procedure: Lower Extremity Angiogram;  Surgeon: Angelia Mould, MD;  Location: [place] CV LAB;  Service: Cardiovascular;  Laterality: Bilateral;  . MENISCUS REPAIR  11/2009  . PARTIAL KNEE ARTHROPLASTY Left 12/26/2014   Procedure: UNICOMPARTMENTAL KNEE;  Surgeon: Marchia Bond,  MD;  Location: [place];  Service: Orthopedics;  Laterality: Left;  . PATCH ANGIOPLASTY Right 05/17/2015   Procedure: VEIN PATCH ANGIOPLASTY ILEOFEMORAL ARTERY;  Surgeon: Angelia Mould, MD;  Location: [place];  Service: Vascular;  Laterality: Right;  . PERCUTANEOUS STENT INTERVENTION N/A 05/10/2012   Procedure: PERCUTANEOUS STENT INTERVENTION;  Surgeon: Angelia Mould, MD;  Location: [facility] CATH LAB;  Service: Cardiovascular;  Laterality: N/A;  . PERIPHERAL VASCULAR CATHETERIZATION N/A 03/23/2015   Procedure: Abdominal Aortogram;  Surgeon: Angelia Mould, MD;  Location: [place] CV LAB;  Service: Cardiovascular;  Laterality: N/A;  . STENTS     PLACED IN ??BOTH LEGS   2013?  . TEE WITHOUT CARDIOVERSION N/A 03/06/2017   Procedure: TRANSESOPHAGEAL ECHOCARDIOGRAM (TEE);  Surgeon: Larey Dresser, MD;  Location: [facility] ENDOSCOPY;  Service: Cardiovascular;  Laterality: N/A;  . TEE WITHOUT CARDIOVERSION N/A 05/08/2017   Procedure:  TRANSESOPHAGEAL ECHOCARDIOGRAM (TEE);  Surgeon: Minna Merritts, MD;  Location: [facility] ORS;  Service: Cardiovascular;  Laterality: N/A;    Family History  Problem Relation Age of Onset  . Diabetes Mother   . Hypertension Mother   . Heart disease Mother        Coronary Artery Bypass Graft  . Hyperlipidemia Mother   . Heart attack Mother   . Hypertension Father   . Hyperlipidemia Sister   . Stroke Sister     Social History   Socioeconomic History  . Marital status: Married    Spouse name: Not on file  . Number of children: Not on file  . Years of education: Not on file  . Highest education level: Not on file  Occupational History  . Not on file  Social Needs  . Financial resource strain: Not on file  . Food insecurity:    Worry: Not on file    Inability: Not on file  . Transportation needs:    Medical: Not on file    Non-medical: Not on file  Tobacco Use  . Smoking status: Former Smoker    Packs/day: 1.00    Years: 20.00    Pack years: 20.00    Types: Cigarettes  . Smokeless tobacco: Never Used  . Tobacco comment: quit smoking 2008  Substance and Sexual Activity  . Alcohol use: Yes    Alcohol/week: 0.0 oz    Comment: beer- occasionally  . Drug use: No  . Sexual activity: Never  Lifestyle  . Physical activity:    Days per week: Not on file    Minutes per session: Not on file  . Stress: Not on file  Relationships  . Social connections:    Talks on phone: Not on file    Gets together: Not on file    Attends religious service: Not on file    Active member of club or organization: Not on file    Attends meetings of clubs or organizations: Not on file    Relationship status: Not on file  . Intimate partner violence:    Fear of current or ex partner: Not on file    Emotionally abused: Not on file    Physically abused: Not on file    Forced sexual activity: Not on file  Other Topics Concern  . Not on file  Social History Narrative  . Not on file    Past  Medical History, Surgical history, Social history, and Family history were reviewed and updated as appropriate.   Please see review of systems for further details on the patient's  review from today.   Review of Systems:  Review of Systems  Constitutional: Positive for chills. Negative for diaphoresis and fever.  HENT: Negative for congestion, postnasal drip, rhinorrhea, sinus pressure, sinus pain, sneezing and sore throat.   Respiratory: Positive for cough and shortness of breath. Negative for choking, chest tightness and wheezing.   Cardiovascular: Negative for chest pain and palpitations.  Neurological: Positive for dizziness and light-headedness. Negative for headaches.    Objective:   Physical Exam:  BP 106/69 (BP Location: Left Arm, Patient Position: Sitting)   Pulse 90   Temp (!) 97.5 F (36.4 C) (Oral) Comment: Liza RN is aware  Resp 18   Ht 6\' 1"  (1.854 m)   Wt 160 lb 12.8 oz (72.9 kg)   SpO2 95%   BMI 21.22 kg/m  ECOG: 1  Orthostatic Blood Pressure: Blood pressure:   sitting 105/65, standing 107/73 Pulse:   sitting 86, standing 75   Physical Exam  Constitutional: No distress.  The patient is an adult male who appears to be markedly fatigued but in no acute distress.  HENT:  Head: Normocephalic and atraumatic.  Right Ear: External ear normal.  Left Ear: External ear normal.  Mouth/Throat: Oropharynx is clear and moist. No oropharyngeal exudate.  Neck: Normal range of motion. Neck supple.  Cardiovascular: Normal rate, regular rhythm and normal heart sounds. Exam reveals no gallop and no friction rub.  No murmur heard. Pulmonary/Chest: Effort normal. No respiratory distress. He has rales (Coarse breath sounds are noted at the base of the right lung field.).  Lymphadenopathy:    He has no cervical adenopathy.  Neurological: He is alert. Coordination (The patient is ambulating with the use of a wheelchair today.) abnormal.  Skin: Skin is warm and dry. No rash  noted. He is not diaphoretic. No erythema.  Psychiatric: He has a normal mood and affect. His behavior is normal. Judgment and thought content normal.    Lab Review:     Component Value Date/Time   NA 133 (L) 12/29/2017 1335   NA 137 06/19/2017 0922   K 3.8 12/29/2017 1335   K 4.1 06/19/2017 0922   CL 98 12/29/2017 1335   CO2 22 12/29/2017 1335   CO2 28 06/19/2017 0922   GLUCOSE 119 12/29/2017 1335   GLUCOSE 211 (H) 06/19/2017 0922   BUN 24 12/29/2017 1335   BUN 12.6 06/19/2017 0922   CREATININE 1.49 (H) 12/29/2017 1335   CREATININE 1.3 06/19/2017 0922   CALCIUM 9.8 12/29/2017 1335   CALCIUM 9.6 06/19/2017 0922   PROT 7.4 12/29/2017 1335   PROT 7.2 06/19/2017 0922   ALBUMIN 3.2 (L) 12/29/2017 1335   ALBUMIN 3.3 (L) 06/19/2017 0922   AST 14 12/29/2017 1335   AST 15 06/19/2017 0922   ALT 12 12/29/2017 1335   ALT 14 06/19/2017 0922   ALKPHOS 72 12/29/2017 1335   ALKPHOS 116 06/19/2017 0922   BILITOT 0.6 12/29/2017 1335   BILITOT 0.56 06/19/2017 0922   GFRNONAA 48 (L) 12/29/2017 1335   GFRAA 55 (L) 12/29/2017 1335       Component Value Date/Time   WBC 9.2 12/29/2017 1335   WBC 2.0 (L) 11/13/2017 0500   RBC 2.76 (L) 12/29/2017 1335   HGB 9.2 (L) 12/29/2017 1335   HGB 10.1 (L) 06/19/2017 0922   HCT 27.2 (L) 12/29/2017 1335   HCT 30.8 (L) 06/19/2017 0922   PLT 118 (L) 12/29/2017 1335   PLT 128 (L) 06/19/2017 0922   MCV 98.3 (H) 12/29/2017  1335   MCV 102.0 (H) 06/19/2017 0922   MCH 33.5 (H) 12/29/2017 1335   MCHC 34.0 12/29/2017 1335   RDW 20.3 (H) 12/29/2017 1335   RDW 15.1 (H) 06/19/2017 0922   LYMPHSABS 1.3 12/29/2017 1335   LYMPHSABS 1.1 06/19/2017 0922   MONOABS 0.5 12/29/2017 1335   MONOABS 0.4 06/19/2017 0922   EOSABS 0.0 12/29/2017 1335   EOSABS 0.1 06/19/2017 0922   BASOSABS 0.0 12/29/2017 1335   BASOSABS 0.0 06/19/2017 0922   -------------------------------  Imaging from last 24 hours (if applicable):  Radiology interpretation: Dg Chest 2  View  Result Date: 12/29/2017 CLINICAL DATA:  Shortness of breath and cough. History metastatic small cell carcinoma of the lung. EXAM: CHEST - 2 VIEW COMPARISON:  Chest x-ray on 07/17/2017 CT of the chest on 09/23/2017 FINDINGS: The heart size is stable. Stable opacity in the left upper lung abutting the mediastinum and consistent with post radiation therapy changes. New patchy reticulonodular airspace opacity in the right lower lobe is suspicious for acute pneumonia. Follow-up recommended given the nodular nature of this opacity and history of malignancy. No edema, pneumothorax or significant pleural fluid. Stable calcified pleural plaque at the diaphragmatic surface on the left. IMPRESSION: Findings suspicious for acute pneumonia of the right lower lobe. Follow-up recommended given nodular appearance of this opacity. Electronically Signed   By: Aletta Edouard M.D.   On: 12/29/2017 17:55

## 2017-12-31 ENCOUNTER — Ambulatory Visit: Payer: Medicare Other

## 2018-01-01 ENCOUNTER — Ambulatory Visit: Payer: Medicare Other

## 2018-01-02 ENCOUNTER — Ambulatory Visit: Payer: Medicare Other

## 2018-01-03 ENCOUNTER — Other Ambulatory Visit: Payer: Self-pay | Admitting: Medical

## 2018-01-03 DIAGNOSIS — J181 Lobar pneumonia, unspecified organism: Principal | ICD-10-CM

## 2018-01-03 DIAGNOSIS — J189 Pneumonia, unspecified organism: Secondary | ICD-10-CM

## 2018-01-04 ENCOUNTER — Telehealth: Payer: Self-pay | Admitting: Medical Oncology

## 2018-01-04 NOTE — Telephone Encounter (Signed)
I told pt I did not call him and to check with pharmacy.

## 2018-01-06 ENCOUNTER — Inpatient Hospital Stay: Payer: BLUE CROSS/BLUE SHIELD | Attending: Internal Medicine

## 2018-01-06 ENCOUNTER — Encounter (HOSPITAL_COMMUNITY): Payer: BLUE CROSS/BLUE SHIELD

## 2018-01-06 ENCOUNTER — Ambulatory Visit: Payer: BLUE CROSS/BLUE SHIELD

## 2018-01-06 ENCOUNTER — Encounter: Payer: Self-pay | Admitting: Internal Medicine

## 2018-01-06 ENCOUNTER — Inpatient Hospital Stay: Payer: BLUE CROSS/BLUE SHIELD

## 2018-01-06 ENCOUNTER — Inpatient Hospital Stay (HOSPITAL_BASED_OUTPATIENT_CLINIC_OR_DEPARTMENT_OTHER): Payer: BLUE CROSS/BLUE SHIELD | Admitting: Internal Medicine

## 2018-01-06 ENCOUNTER — Telehealth: Payer: Self-pay

## 2018-01-06 ENCOUNTER — Ambulatory Visit: Payer: BLUE CROSS/BLUE SHIELD | Admitting: Vascular Surgery

## 2018-01-06 ENCOUNTER — Telehealth: Payer: Self-pay | Admitting: Internal Medicine

## 2018-01-06 VITALS — BP 109/73 | HR 91 | Temp 97.6°F | Resp 17 | Ht 73.0 in | Wt 153.8 lb

## 2018-01-06 DIAGNOSIS — D6481 Anemia due to antineoplastic chemotherapy: Secondary | ICD-10-CM | POA: Diagnosis not present

## 2018-01-06 DIAGNOSIS — Z79899 Other long term (current) drug therapy: Secondary | ICD-10-CM | POA: Insufficient documentation

## 2018-01-06 DIAGNOSIS — C3412 Malignant neoplasm of upper lobe, left bronchus or lung: Secondary | ICD-10-CM | POA: Insufficient documentation

## 2018-01-06 DIAGNOSIS — C7931 Secondary malignant neoplasm of brain: Secondary | ICD-10-CM | POA: Insufficient documentation

## 2018-01-06 DIAGNOSIS — C3492 Malignant neoplasm of unspecified part of left bronchus or lung: Secondary | ICD-10-CM

## 2018-01-06 DIAGNOSIS — C3402 Malignant neoplasm of left main bronchus: Secondary | ICD-10-CM

## 2018-01-06 DIAGNOSIS — Z5112 Encounter for antineoplastic immunotherapy: Secondary | ICD-10-CM | POA: Insufficient documentation

## 2018-01-06 DIAGNOSIS — E876 Hypokalemia: Secondary | ICD-10-CM | POA: Diagnosis not present

## 2018-01-06 DIAGNOSIS — Z95828 Presence of other vascular implants and grafts: Secondary | ICD-10-CM

## 2018-01-06 DIAGNOSIS — J441 Chronic obstructive pulmonary disease with (acute) exacerbation: Secondary | ICD-10-CM

## 2018-01-06 DIAGNOSIS — M549 Dorsalgia, unspecified: Secondary | ICD-10-CM | POA: Insufficient documentation

## 2018-01-06 DIAGNOSIS — Z713 Dietary counseling and surveillance: Secondary | ICD-10-CM

## 2018-01-06 LAB — CMP (CANCER CENTER ONLY)
ALT: 16 U/L (ref 0–55)
AST: 9 U/L (ref 5–34)
Albumin: 3.4 g/dL — ABNORMAL LOW (ref 3.5–5.0)
Alkaline Phosphatase: 84 U/L (ref 40–150)
Anion gap: 17 — ABNORMAL HIGH (ref 3–11)
BILIRUBIN TOTAL: 0.6 mg/dL (ref 0.2–1.2)
BUN: 42 mg/dL — ABNORMAL HIGH (ref 7–26)
CHLORIDE: 97 mmol/L — AB (ref 98–109)
CO2: 23 mmol/L (ref 22–29)
Calcium: 10.5 mg/dL — ABNORMAL HIGH (ref 8.4–10.4)
Creatinine: 2.72 mg/dL — ABNORMAL HIGH (ref 0.70–1.30)
GFR, EST AFRICAN AMERICAN: 27 mL/min — AB (ref >60)
GFR, EST NON AFRICAN AMERICAN: 23 mL/min — AB (ref >60)
Glucose, Bld: 200 mg/dL — ABNORMAL HIGH (ref 70–140)
POTASSIUM: 3.2 mmol/L — AB (ref 3.5–5.1)
Sodium: 137 mmol/L (ref 136–145)
TOTAL PROTEIN: 7.8 g/dL (ref 6.4–8.3)

## 2018-01-06 LAB — CBC WITH DIFFERENTIAL (CANCER CENTER ONLY)
Basophils Absolute: 0 10*3/uL (ref 0.0–0.1)
Basophils Relative: 1 %
EOS PCT: 0 %
Eosinophils Absolute: 0 10*3/uL (ref 0.0–0.5)
HEMATOCRIT: 27.5 % — AB (ref 38.4–49.9)
Hemoglobin: 9.3 g/dL — ABNORMAL LOW (ref 13.0–17.1)
LYMPHS ABS: 0.9 10*3/uL (ref 0.9–3.3)
Lymphocytes Relative: 11 %
MCH: 33.5 pg — AB (ref 27.2–33.4)
MCHC: 33.9 g/dL (ref 32.0–36.0)
MCV: 98.9 fL — AB (ref 79.3–98.0)
MONO ABS: 0.3 10*3/uL (ref 0.1–0.9)
Monocytes Relative: 4 %
Neutro Abs: 6.5 10*3/uL (ref 1.5–6.5)
Neutrophils Relative %: 84 %
PLATELETS: 131 10*3/uL — AB (ref 140–400)
RBC: 2.78 MIL/uL — ABNORMAL LOW (ref 4.20–5.82)
RDW: 18.5 % — AB (ref 11.0–14.6)
WBC Count: 7.7 10*3/uL (ref 4.0–10.3)

## 2018-01-06 LAB — TSH: TSH: 1.336 u[IU]/mL (ref 0.320–4.118)

## 2018-01-06 MED ORDER — HEPARIN SOD (PORK) LOCK FLUSH 100 UNIT/ML IV SOLN
500.0000 [IU] | Freq: Once | INTRAVENOUS | Status: AC | PRN
Start: 2018-01-06 — End: 2018-01-06
  Administered 2018-01-06: 500 [IU]
  Filled 2018-01-06: qty 5

## 2018-01-06 MED ORDER — SODIUM CHLORIDE 0.9 % IV SOLN
1200.0000 mg | Freq: Once | INTRAVENOUS | Status: AC
  Administered 2018-01-06: 1200 mg via INTRAVENOUS
  Filled 2018-01-06: qty 20

## 2018-01-06 MED ORDER — SODIUM CHLORIDE 0.9% FLUSH
10.0000 mL | INTRAVENOUS | Status: DC | PRN
  Administered 2018-01-06: 10 mL
  Filled 2018-01-06: qty 10

## 2018-01-06 MED ORDER — SODIUM CHLORIDE 0.9 % IV SOLN
1000.0000 mL | INTRAVENOUS | Status: AC
  Administered 2018-01-06: 1000 mL via INTRAVENOUS

## 2018-01-06 MED ORDER — SODIUM CHLORIDE 0.9% FLUSH
10.0000 mL | INTRAVENOUS | Status: DC | PRN
  Administered 2018-01-06: 10 mL via INTRAVENOUS
  Filled 2018-01-06: qty 10

## 2018-01-06 MED ORDER — SODIUM CHLORIDE 0.9 % IV SOLN
Freq: Once | INTRAVENOUS | Status: AC
  Administered 2018-01-06: 12:00:00 via INTRAVENOUS

## 2018-01-06 NOTE — Patient Instructions (Signed)
Implanted Port Home Guide An implanted port is a type of central line that is placed under the skin. Central lines are used to provide IV access when treatment or nutrition needs to be given through a person's veins. Implanted ports are used for long-term IV access. An implanted port may be placed because:  You need IV medicine that would be irritating to the small veins in your hands or arms.  You need long-term IV medicines, such as antibiotics.  You need IV nutrition for a long period.  You need frequent blood draws for lab tests.  You need dialysis.  Implanted ports are usually placed in the chest area, but they can also be placed in the upper arm, the abdomen, or the leg. An implanted port has two main parts:  Reservoir. The reservoir is round and will appear as a small, raised area under your skin. The reservoir is the part where a needle is inserted to give medicines or draw blood.  Catheter. The catheter is a thin, flexible tube that extends from the reservoir. The catheter is placed into a large vein. Medicine that is inserted into the reservoir goes into the catheter and then into the vein.  How will I care for my incision site? Do not get the incision site wet. Bathe or shower as directed by your health care provider. How is my port accessed? Special steps must be taken to access the port:  Before the port is accessed, a numbing cream can be placed on the skin. This helps numb the skin over the port site.  Your health care provider uses a sterile technique to access the port. ? Your health care provider must put on a mask and sterile gloves. ? The skin over your port is cleaned carefully with an antiseptic and allowed to dry. ? The port is gently pinched between sterile gloves, and a needle is inserted into the port.  Only "non-coring" port needles should be used to access the port. Once the port is accessed, a blood return should be checked. This helps ensure that the port  is in the vein and is not clogged.  If your port needs to remain accessed for a constant infusion, a clear (transparent) bandage will be placed over the needle site. The bandage and needle will need to be changed every week, or as directed by your health care provider.  Keep the bandage covering the needle clean and dry. Do not get it wet. Follow your health care provider's instructions on how to take a shower or bath while the port is accessed.  If your port does not need to stay accessed, no bandage is needed over the port.  What is flushing? Flushing helps keep the port from getting clogged. Follow your health care provider's instructions on how and when to flush the port. Ports are usually flushed with saline solution or a medicine called heparin. The need for flushing will depend on how the port is used.  If the port is used for intermittent medicines or blood draws, the port will need to be flushed: ? After medicines have been given. ? After blood has been drawn. ? As part of routine maintenance.  If a constant infusion is running, the port may not need to be flushed.  How long will my port stay implanted? The port can stay in for as long as your health care provider thinks it is needed. When it is time for the port to come out, surgery will be   done to remove it. The procedure is similar to the one performed when the port was put in. When should I seek immediate medical care? When you have an implanted port, you should seek immediate medical care if:  You notice a bad smell coming from the incision site.  You have swelling, redness, or drainage at the incision site.  You have more swelling or pain at the port site or the surrounding area.  You have a fever that is not controlled with medicine.  This information is not intended to replace advice given to you by your health care provider. Make sure you discuss any questions you have with your health care provider. Document  Released: 07/21/2005 Document Revised: 12/27/2015 Document Reviewed: 03/28/2013 Elsevier Interactive Patient Education  2017 Elsevier Inc.  

## 2018-01-06 NOTE — Progress Notes (Signed)
CBC/CMET reviewed with MD ok to treat despite labs.  IVF 1 Liter NS added to  infusion appt.

## 2018-01-06 NOTE — Addendum Note (Signed)
Addended by: Lucile Crater on: 01/06/2018 10:19 AM   Modules accepted: Orders

## 2018-01-06 NOTE — Telephone Encounter (Signed)
Printed avs and calender of upcoming appointment per 6/5 los. Gave patient contrast, instructions, and number for CT information

## 2018-01-06 NOTE — Patient Instructions (Signed)
[facility] Discharge Instructions for Patients Receiving Chemotherapy  Today you received the following chemotherapy agents Tecentriq.   To help prevent nausea and vomiting after your treatment, we encourage you to take your nausea medication as prescribed.   If you develop nausea and vomiting that is not controlled by your nausea medication, call the clinic.   BELOW ARE SYMPTOMS THAT SHOULD BE REPORTED IMMEDIATELY:  *FEVER GREATER THAN 100.5 F  *CHILLS WITH OR WITHOUT FEVER  NAUSEA AND VOMITING THAT IS NOT CONTROLLED WITH YOUR NAUSEA MEDICATION  *UNUSUAL SHORTNESS OF BREATH  *UNUSUAL BRUISING OR BLEEDING  TENDERNESS IN MOUTH AND THROAT WITH OR WITHOUT PRESENCE OF ULCERS  *URINARY PROBLEMS  *BOWEL PROBLEMS  UNUSUAL RASH Items with * indicate a potential emergency and should be followed up as soon as possible.  Feel free to call the clinic should you have any questions or concerns. The clinic phone number is [phone].  Please show the [facility] at check-in to the Emergency Department and triage nurse.    Dehydration, Adult Dehydration is when there is not enough fluid or water in your body. This happens when you lose more fluids than you take in. Dehydration can range from mild to very bad. It should be treated right away to keep it from getting very bad. Symptoms of mild dehydration may include:  Thirst.  Dry lips.  Slightly dry mouth.  Dry, warm skin.  Dizziness. Symptoms of moderate dehydration may include:  Very dry mouth.  Muscle cramps.  Dark pee (urine). Pee may be the color of tea.  Your body making less pee.  Your eyes making fewer tears.  Heartbeat that is uneven or faster than normal (palpitations).  Headache.  Light-headedness, especially when you stand up from sitting.  Fainting (syncope). Symptoms of very bad dehydration may include:  Changes in skin, such as: ? Cold and clammy skin. ? Blotchy (mottled)  or pale skin. ? Skin that does not quickly return to normal after being lightly pinched and let go (poor skin turgor).  Changes in body fluids, such as: ? Feeling very thirsty. ? Your eyes making fewer tears. ? Not sweating when body temperature is high, such as in hot weather. ? Your body making very little pee.  Changes in vital signs, such as: ? Weak pulse. ? Pulse that is more than 100 beats a minute when you are sitting still. ? Fast breathing. ? Low blood pressure.  Other changes, such as: ? Sunken eyes. ? Cold hands and feet. ? Confusion. ? Lack of energy (lethargy). ? Trouble waking up from sleep. ? Short-term weight loss. ? Unconsciousness. Follow these instructions at home:  If told by your doctor, drink an ORS: ? Make an ORS by using instructions on the package. ? Start by drinking small amounts, about  cup (120 mL) every 5-10 minutes. ? Slowly drink more until you have had the amount that your doctor said to have.  Drink enough clear fluid to keep your pee clear or pale yellow. If you were told to drink an ORS, finish the ORS first, then start slowly drinking clear fluids. Drink fluids such as: ? Water. Do not drink only water by itself. Doing that can make the salt (sodium) level in your body get too low (hyponatremia). ? Ice chips. ? Fruit juice that you have added water to (diluted). ? Low-calorie sports drinks.  Avoid: ? Alcohol. ? Drinks that have a lot of sugar. These include high-calorie sports drinks,  fruit juice that does not have water added, and soda. ? Caffeine. ? Foods that are greasy or have a lot of fat or sugar.  Take over-the-counter and prescription medicines only as told by your doctor.  Do not take salt tablets. Doing that can make the salt level in your body get too high (hypernatremia).  Eat foods that have minerals (electrolytes). Examples include bananas, oranges, potatoes, tomatoes, and spinach.  Keep all follow-up visits as told  by your doctor. This is important. Contact a doctor if:  You have belly (abdominal) pain that: ? Gets worse. ? Stays in one area (localizes).  You have a rash.  You have a stiff neck.  You get angry or annoyed more easily than normal (irritability).  You are more sleepy than normal.  You have a harder time waking up than normal.  You feel: ? Weak. ? Dizzy. ? Very thirsty.  You have peed (urinated) only a small amount of very dark pee during 6-8 hours. Get help right away if:  You have symptoms of very bad dehydration.  You cannot drink fluids without throwing up (vomiting).  Your symptoms get worse with treatment.  You have a fever.  You have a very bad headache.  You are throwing up or having watery poop (diarrhea) and it: ? Gets worse. ? Does not go away.  You have blood or something green (bile) in your throw-up.  You have blood in your poop (stool). This may cause poop to look black and tarry.  You have not peed in 6-8 hours.  You pass out (faint).  Your heart rate when you are sitting still is more than 100 beats a minute.  You have trouble breathing. This information is not intended to replace advice given to you by your health care provider. Make sure you discuss any questions you have with your health care provider. Document Released: 05/17/2009 Document Revised: 02/08/2016 Document Reviewed: 09/14/2015 Elsevier Interactive Patient Education  2018 [employer].

## 2018-01-06 NOTE — Progress Notes (Signed)
[place] [phone]   [phone]  OFFICE PROGRESS NOTE  [name], MD [address]  [place] [address]  DIAGNOSIS: Extensive stage (T4, N2, M1b) small cell lung cancer presented with large left upper lobe lung mass with mediastinal invasion as well as pancreatic lesion and suspicious left iliac bone metastasis diagnosed in February 2018.  PRIOR THERAPY: 1) Systemic chemotherapy with carboplatin for AUC of 5 on day 1 and etoposide 100 MG/M2 on days 1, 2 and 3 with Neulasta support status post 6 cycles.Last dose of chemotherapy was given January 14, 2017 with partial response. This is concurrent with radiotherapy. This also concurrent with radiation to the large lung mass. 2) prophylactic cranial irradiation under the care of Dr. Sondra Come. 3)Systemic chemotherapy with carboplatin for AUC of 5 on day 1, etoposide 100 mg/M2 on days 1, 2 and 3, Tecentriq (Atezolizumab) 1200 mg IV every 3 weeks with Neulasta support. First dose October 28, 2017.Status post 1 cycle. Carboplatin and etoposide were stopped due to intolerance.  CURRENT THERAPY: Tecentriq 1200 mg IV every 3 weeks as a single agent.  First cycle was started on 11/25/2017.  Status post 2 cycles.  INTERVAL HISTORY: Jesse Avila 67 y.o. male returns to the clinic today for follow-up visit accompanied by his wife and daughter.  The patient continues to complain of increasing fatigue and weakness.  He was also seen recently at the emergency department for shortness of breath and weakness and chest x-ray showed suspicious pneumonia.  He was treated with a course of doxycycline.  He lost around 7 pounds since his last visit.  He denied having any chest pain but has shortness of breath with exertion with no cough or hemoptysis.  He has no fever or chills.  He denied having any nausea, vomiting, diarrhea or constipation.  He has been tolerating his treatment with Tecentriq  Huey Bienenstock) fairly well.  He is here for evaluation before starting cycle #3 of the single agent treatment.  MEDICAL HISTORY: Past Medical History:  Diagnosis Date  . Atrial fibrillation ([place])   . Carotid artery occlusion   . DJD (degenerative joint disease) of knee   . Essential hypertension, benign    takes Benicar daily  . Goals of care, counseling/discussion 09/18/2016  . History of colon polyps    benign  . History of gout   . History of radiation therapy 09/30/16-11/10/16   left lung 60 Gy in 30 fractions  . Hyperlipidemia    takes Fenofibrate and Crestor daily  . Insomnia    takes Ambien nightly as needed  . Joint pain   . Lung cancer ([place])   . Nocturia   . Peripheral vascular disease ([place])   . Primary localized osteoarthritis of left knee 12/26/2014  . Stroke [facility]) 2015?   takes Plavix daily as well as Pletal,not on plavix at present 05/15/15  . 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  MEDICATIONS:  Current Outpatient Medications  Medication Sig Dispense Refill  . amiodarone (PACERONE) 200 MG tablet Take 1 tablet (200 mg total) by mouth daily. 90 tablet 0  . doxycycline (VIBRA-TABS) 100 MG tablet Take 1 tablet (100 mg total) by mouth 2 (two) times daily. 14 tablet 0  . dronabinol (MARINOL) 2.5 MG capsule Take 1 capsule (2.5 mg total)  by mouth 2 (two) times daily before a meal. 60 capsule 0  . furosemide (LASIX) 40 MG tablet TAKE 1 TABLET BY MOUTH TWICE A DAY (Patient taking differently: TAKE 40 MG BY MOUTH TWICE A DAY) 60 tablet 6  . HYDROcodone-acetaminophen (NORCO) 5-325 MG tablet Take 1 tablet by mouth every 6 (six) hours as needed for moderate pain. 30 tablet 0  . insulin glargine (LANTUS) 100 UNIT/ML injection Inject 0.1 mLs (10 Units total) into the skin daily. (Patient taking differently: Inject 15  Units into the skin at bedtime. ) 10 mL 11  . KLOR-CON M20 20 MEQ tablet TAKE 1/2 TABLET (10MEQ) BY MOUTH DAILY. 10 tablet 0  . lactulose (CHRONULAC) 10 GM/15ML solution Take 30 mLs (20 g total) by mouth daily as needed for mild constipation or moderate constipation. 480 mL 0  . lidocaine (XYLOCAINE) 2 % solution Use as directed 15 mLs in the mouth or throat 3 (three) times daily before meals. 100 mL 0  . lidocaine-prilocaine (EMLA) cream Apply 1 application topically as needed. 30 g 4  . OVER THE COUNTER MEDICATION Take 1 capsule by mouth daily. Hemp Extract Supplement    . pantoprazole (PROTONIX) 40 MG tablet Take 1 tablet (40 mg total) by mouth 2 (two) times daily for 15 days. 30 tablet 0  . pantoprazole (PROTONIX) 40 MG tablet Take 1 tablet (40 mg total) by mouth daily. 30 tablet 0  . potassium chloride SA (KLOR-CON M20) 20 MEQ tablet Take 1 tablet (20 mEq total) by mouth daily for 7 days. 7 tablet 0  . prochlorperazine (COMPAZINE) 10 MG tablet Take 1 tablet (10 mg total) by mouth every 6 (six) hours as needed for nausea or vomiting. 30 tablet 0  . spironolactone (ALDACTONE) 25 MG tablet Take 1 tablet (25 mg total) by mouth daily. 30 tablet 0  . sucralfate (CARAFATE) 1 GM/10ML suspension Take 10 mLs (1 g total) by mouth 4 (four) times daily - after meals and at bedtime. 420 mL 0  . zolpidem (AMBIEN) 10 MG tablet Take 10 mg by mouth at bedtime.      No current facility-administered medications for this visit.    Facility-Administered Medications Ordered in Other Visits  Medication Dose Route Frequency Provider Last Rate Last Dose  . sodium chloride flush (NS) 0.9 % injection 10 mL  10 mL Intravenous PRN Curt Bears, MD   10 mL at 01/06/18 0845    SURGICAL HISTORY:  Past Surgical History:  Procedure Laterality Date  . ABDOMINAL AORTAGRAM N/A 03/29/2012   Procedure: ABDOMINAL Maxcine Ham;  Surgeon: Angelia Mould, MD;  Location: [facility] CATH LAB;  Service: Cardiovascular;  Laterality:  N/A;  . CARDIOVERSION N/A 05/08/2017   Procedure: CARDIOVERSION;  Surgeon: Minna Merritts, MD;  Location: [facility] ORS;  Service: Cardiovascular;  Laterality: N/A;  . COLONOSCOPY    . ENDARTERECTOMY Right 05/09/2014   Procedure: RIGHT CAROTID ENDARTERECTOMY WITH PATCH ANGIOPLASTY;  Surgeon: Conrad , MD;  Location: [place];  Service: Vascular;  Laterality: Right;  . ENDARTERECTOMY FEMORAL Right 05/17/2015   Procedure: ENDARTERECTOMY FEMORAL;  Surgeon: Angelia Mould, MD;  Location: [place];  Service: Vascular;  Laterality: Right;  . ENDOBRONCHIAL ULTRASOUND Bilateral 09/08/2016   Procedure: ENDOBRONCHIAL ULTRASOUND;  Surgeon: Rigoberto Noel, MD;  Location: [facility] ENDOSCOPY;  Service: Cardiopulmonary;  Laterality: Bilateral;  . EUS N/A 10/08/2017   Procedure: UPPER ENDOSCOPIC ULTRASOUND (EUS) LINEAR;  Surgeon: [name], MD;  Location: [facility] ENDOSCOPY;  Service: Endoscopy;  Laterality: N/A;  .  FEMORAL-POPLITEAL BYPASS GRAFT Right 05/17/2015   Procedure: BYPASS GRAFT FEMORAL-BELOW KNEE POPLITEAL ARTERY, using gortex propaten graft 6 mm x 80 cm;  Surgeon: Angelia Mould, MD;  Location: [place];  Service: Vascular;  Laterality: Right;  . IR GENERIC HISTORICAL  09/25/2016   IR FLUORO GUIDE PORT INSERTION RIGHT 09/25/2016 Markus Daft, MD [facility]INTER[facility]  . IR GENERIC HISTORICAL  09/25/2016   IR US GUIDE VASC ACCESS RIGHT 09/25/2016 Markus Daft, MD [facility]  . KNEE ARTHROSCOPY Right 11/2009  . LOWER EXTREMITY ANGIOGRAM Bilateral 03/23/2015   Procedure: Lower Extremity Angiogram;  Surgeon: Angelia Mould, MD;  Location: [place] CV LAB;  Service: Cardiovascular;  Laterality: Bilateral;  . MENISCUS REPAIR  11/2009  . PARTIAL KNEE ARTHROPLASTY Left 12/26/2014   Procedure: UNICOMPARTMENTAL KNEE;  Surgeon: Marchia Bond, MD;  Location: [place];  Service: Orthopedics;  Laterality: Left;  . PATCH ANGIOPLASTY Right 05/17/2015   Procedure: VEIN PATCH ANGIOPLASTY ILEOFEMORAL ARTERY;  Surgeon: Angelia Mould, MD;  Location: [place];  Service: Vascular;  Laterality: Right;  . PERCUTANEOUS STENT INTERVENTION N/A 05/10/2012   Procedure: PERCUTANEOUS STENT INTERVENTION;  Surgeon: Angelia Mould, MD;  [facility] C[facility] LAB;  Service: Cardiovascular;  Laterality: N/A;  . PERIPHERAL VASCULAR CATHETERIZATION N/A 03/23/2015   Procedure: Abdominal Aortogram;  Surgeon: Angelia Mould, MD;  Location: [place] CV LAB;  Service: Cardiovascular;  Laterality: N/A;  . STENTS     PLACED IN ??BOTH LEGS   2013?  . TEE WITHOUT CARDIOVERSION N/A 03/06/2017   Procedure: TRANSESOPHAGEAL ECHOCARDIOGRAM (TEE);  Surgeon: Larey Dresser, MD;  Location: [facility] ENDOSCOPY;  Service: Cardiovascular;  Laterality: N/A;  . TEE WITHOUT CARDIOVERSION N/A 05/08/2017   Procedure: TRANSESOPHAGEAL ECHOCARDIOGRAM (TEE);  Surgeon: Minna Merritts, MD;  Location: [facility] ORS;  Service: Cardiovascular;  Laterality: N/A;    REVIEW OF SYSTEMS:  A comprehensive review of systems was negative except for: Constitutional: positive for fatigue and weight loss Respiratory: positive for dyspnea on exertion Musculoskeletal: positive for muscle weakness   PHYSICAL EXAMINATION: General appearance: alert, cooperative, fatigued and no distress Head: Normocephalic, without obvious abnormality, atraumatic Neck: no adenopathy, no JVD, supple, symmetrical, trachea midline and thyroid not enlarged, symmetric, no tenderness/mass/nodules Lymph nodes: Cervical, supraclavicular, and axillary nodes normal. Resp: clear to auscultation bilaterally Back: symmetric, no curvature. ROM normal. No CVA tenderness. Cardio: regular rate and rhythm, S1, S2 normal, no murmur, click, rub or gallop GI: soft, non-tender; bowel sounds normal; no masses,  no organomegaly Extremities: extremities normal, atraumatic, no cyanosis or edema  ECOG PERFORMANCE STATUS: 2 - Symptomatic, <50% confined to bed  Blood pressure 109/73, pulse 91, temperature 97.6 F (36.4  C), temperature source Oral, resp. rate 17, height 6\' 1"  (1.854 m), weight 153 lb 12.8 oz (69.8 kg), SpO2 97 %.  LABORATORY DATA: Lab Results  Component Value Date   WBC 7.7 01/06/2018   HGB 9.3 (L) 01/06/2018   HCT 27.5 (L) 01/06/2018   MCV 98.9 (H) 01/06/2018   PLT 131 (L) 01/06/2018      Chemistry      Component Value Date/Time   NA 133 (L) 12/29/2017 1335   NA 137 06/19/2017 0922   K 3.8 12/29/2017 1335   K 4.1 06/19/2017 0922   CL 98 12/29/2017 1335   CO2 22 12/29/2017 1335   CO2 28 06/19/2017 0922   BUN 24 12/29/2017 1335   BUN 12.6 06/19/2017 0922   CREATININE 1.49 (H) 12/29/2017 1335   CREATININE 1.3 06/19/2017  1281      Component Value Date/Time   CALCIUM 9.8 12/29/2017 1335   CALCIUM 9.6 06/19/2017 0922   ALKPHOS 72 12/29/2017 1335   ALKPHOS 116 06/19/2017 0922   AST 14 12/29/2017 1335   AST 15 06/19/2017 0922   ALT 12 12/29/2017 1335   ALT 14 06/19/2017 0922   BILITOT 0.6 12/29/2017 1335   BILITOT 0.56 06/19/2017 0922       RADIOGRAPHIC STUDIES: Dg Chest 2 View  Result Date: 12/29/2017 CLINICAL DATA:  Shortness of breath and cough. History metastatic small cell carcinoma of the lung. EXAM: CHEST - 2 VIEW COMPARISON:  Chest x-ray on 07/17/2017 CT of the chest on 09/23/2017 FINDINGS: The heart size is stable. Stable opacity in the left upper lung abutting the mediastinum and consistent with post radiation therapy changes. New patchy reticulonodular airspace opacity in the right lower lobe is suspicious for acute pneumonia. Follow-up recommended given the nodular nature of this opacity and history of malignancy. No edema, pneumothorax or significant pleural fluid. Stable calcified pleural plaque at the diaphragmatic surface on the left. IMPRESSION: Findings suspicious for acute pneumonia of the right lower lobe. Follow-up recommended given nodular appearance of this opacity. Electronically Signed   By: Aletta Edouard M.D.   On: 12/29/2017 17:55    ASSESSMENT  AND PLAN: This is a very pleasant 67 years old white male with extensive stage small cell lung cancer status post first-line systemic chemotherapy with carboplatin and etoposide for 6 cycles with partial response.  The patient had evidence for disease recurrence and he was a started on treatment again with carboplatin, etoposide and Tecentriq status post 1 cycle but he could not tolerate his systemic chemotherapy and this was discontinued.  He is currently undergoing treatment with single agent Tecentriq status post 2 cycles and has been tolerating this treatment well except for the persistent baseline fatigue. I recommended for him to proceed with cycle #3 today as a schedule.  I will arrange for the patient to have repeat CT scan of the chest, abdomen and pelvis in 3 weeks before starting cycle #4. His fatigue is secondary to chemotherapy-induced anemia.  We will continue to monitor for now and consider The patient for transfusion of his hemoglobin is less than 8. He was advised to call immediately if he has any concerning symptoms in the interval. The patient voices understanding of current disease status and treatment options and is in agreement with the current care plan.  All questions were answered. The patient knows to call the clinic with any problems, questions or concerns. We can certainly see the patient much sooner if necessary.  I spent 10 minutes counseling the patient face to face. The total time spent in the appointment was 15 minutes.  Disclaimer: This note was dictated with voice recognition software. Similar sounding words can inadvertently be transcribed and may not be corrected upon review.

## 2018-01-06 NOTE — Telephone Encounter (Signed)
appts already scheduled per 6/5 los - central radiology to contact patient with ct scan .

## 2018-01-07 ENCOUNTER — Other Ambulatory Visit: Payer: Self-pay | Admitting: Internal Medicine

## 2018-01-14 ENCOUNTER — Telehealth: Payer: Self-pay | Admitting: *Deleted

## 2018-01-14 ENCOUNTER — Other Ambulatory Visit: Payer: Self-pay | Admitting: *Deleted

## 2018-01-14 DIAGNOSIS — C3492 Malignant neoplasm of unspecified part of left bronchus or lung: Secondary | ICD-10-CM

## 2018-01-14 MED ORDER — PROCHLORPERAZINE MALEATE 10 MG PO TABS: 10.0000 mg | ORAL_TABLET | Freq: Four times a day (QID) | ORAL | 0 refills | Status: DC | PRN

## 2018-01-14 NOTE — Telephone Encounter (Signed)
PT called states I'm really dizzy, even when I turn my head, i'm falling, vomiting when I get up. Reviewed with MD, Brain MRI ordered stat. Scheduled for tomorrow at 630pm. Pt and wife aware and verbalized understanding.

## 2018-01-14 NOTE — Telephone Encounter (Signed)
Call received from [name] in reference to "Problem standing.  Get Dizzy every time feeilng like I'll fall.for the past week.  Have not missed Lasix or Amiodarone.  I can't do anything so I do not get short of breath.  Assisted to bathroom.  B/P today sitting at kitchen table = 107/82.  No N/V,diarrhea (LBM was yesterday), chest pain, palpitations.   chesCall transferred to collaborative to help with this matter.   This nurse advise to increase fluids, keep cane and walker at all times, report to ED if any distress.  Cardiologist also needs to be aware of symptoms.  Strongly advised again with response "I doubt I go there.  I'll just hang in there."

## 2018-01-15 ENCOUNTER — Other Ambulatory Visit: Payer: Self-pay | Admitting: Internal Medicine

## 2018-01-15 ENCOUNTER — Ambulatory Visit (HOSPITAL_COMMUNITY)
Admission: RE | Admit: 2018-01-15 | Discharge: 2018-01-15 | Disposition: A | Discharge status: Home / Self Care | Payer: BLUE CROSS/BLUE SHIELD | Source: Ambulatory Visit | Attending: Internal Medicine | Admitting: Internal Medicine

## 2018-01-15 DIAGNOSIS — C3492 Malignant neoplasm of unspecified part of left bronchus or lung: Secondary | ICD-10-CM | POA: Diagnosis present

## 2018-01-15 DIAGNOSIS — G9389 Other specified disorders of brain: Secondary | ICD-10-CM | POA: Diagnosis not present

## 2018-01-19 ENCOUNTER — Telehealth: Payer: Self-pay | Admitting: Medical Oncology

## 2018-01-19 NOTE — Telephone Encounter (Signed)
Per Julien Nordmann I told Jesse Avila he has a small metastatic lesion in the brain and he was referred to Dr Sondra Come. I told pt he will get a call from radiation with an appt.

## 2018-01-20 NOTE — Progress Notes (Signed)
Location/Histology of Brain Tumor: Small cell lung carcinoma, left with brain mets  per MRI 01/15/2018:  IMPRESSION: 11 mm lesion in the right frontal periventricular white matter compatible with metastatic disease.  Patient presented with symptoms of:  Dizziness, falling, and vomiting upon rising. Fatigue and weakness.   Past or anticipated interventions, if any, per neurosurgery: None at this time.   Past or anticipated interventions, if any, per medical oncology: Dr. Julien Nordmann  01-25-18 CT Chest wo contrast  IMPRESSION: 1. Similar-appearing postradiation changes within the left lung. No evidence for localized recurrence. 2. Interval increase in size of mass within the upper abdomen between the pancreatic head and duodenum, compatible with metastatic disease. 3. Predominately tree-in-bud ground-glass nodular opacities within the right lower lobe, new from prior, likely infectious/inflammatory in etiology. Recommend attention on follow-up.   01-25-18   CT Abdomen Pelvis WO contrast    IMPRESSION: 1. Similar-appearing postradiation changes within the left lung. No evidence for localized recurrence. 2. Interval increase in size of mass within the upper abdomen between the pancreatic head and duodenum, compatible with metastatic disease. 3. Predominately tree-in-bud ground-glass nodular opacities within the right lower lobe, new from prior, likely infectious/inflammatory in etiology. Recommend attention on follow-up.    01-15-18  MRI Brain w wo contrast  IMPRESSION: 11 mm lesion in the right frontal periventricular white matter compatible with metastatic disease.  No acute infarct. No midline shift.   CURRENT THERAPY:Tecentriq 1200 mg IV every 3 weeks as a single agent.First cycle was started on4/24/2019.Status post 2 cycles.   PRIOR THERAPY: 1) Systemic chemotherapy with carboplatin for AUC of 5 on day 1 and etoposide 100 MG/M2 on days 1, 2 and 3 with Neulasta  support status post 6 cycles.Last dose of chemotherapy was given January 14, 2017 with partial response. This is concurrent with radiotherapy. This also concurrent with radiation to the large lung mass. 2) prophylactic cranial irradiation under the care of Dr. Sondra Come. 3)Systemic chemotherapy with carboplatin for AUC of 5 on day 1, etoposide 100 mg/M2 on days 1, 2 and 3, Tecentriq (Atezolizumab) 1200 mg IV every 3 weeks with Neulasta support. First dose October 28, 2017.Status post 1 cycle. Carboplatin and etoposide were stopped due to intolerance.     Dose of Decadron, if applicable: No  Recent neurologic symptoms, if any:   Seizures: No  Headaches: Reports very few frontal headaches ,takes Tylenol  Nausea: Yes takes compazine  Dizziness/ataxia: Yes  Difficulty with hand coordination: No  Focal numbness/weakness: No  Visual deficits/changes: No visual change  Confusion/Memory deficits: No except forgetful about a person's name.  Painful bone metastases at present, if NWG:[id number] of back  SAFETY ISSUES:  Prior radiation? 03-09-17  03-20-17 Whole brain                                  09-30-16 11-10-16 Left lung  Pacemaker/ICD? No  Possible current pregnancy? No  Is the patient on methotrexate? No  Additional Complaints / other details:  Wt Readings from Last 3 Encounters:  01/28/18 152 lb 9.6 oz (69.2 kg)  01/27/18 150 lb 9.6 oz (68.3 kg)  01/06/18 153 lb 12.8 oz (69.8 kg)  BP 115/82 (BP Location: Left Arm, Patient Position: Sitting, Cuff Size: Normal)   Pulse 81   Temp 99.5 F (37.5 C) (Oral)   Resp 20   Ht 6\' 1"  (1.854 m)   Wt 152 lb 9.6 oz (69.2 kg)  SpO2 99%   BMI 20.13 kg/m

## 2018-01-21 ENCOUNTER — Other Ambulatory Visit: Payer: Self-pay | Admitting: Internal Medicine

## 2018-01-25 ENCOUNTER — Ambulatory Visit (HOSPITAL_COMMUNITY)
Admission: RE | Admit: 2018-01-25 | Discharge: 2018-01-25 | Disposition: A | Discharge status: Home / Self Care | Payer: BLUE CROSS/BLUE SHIELD | Source: Ambulatory Visit | Attending: Internal Medicine | Admitting: Internal Medicine

## 2018-01-25 ENCOUNTER — Encounter (HOSPITAL_COMMUNITY): Payer: Self-pay

## 2018-01-25 DIAGNOSIS — C3492 Malignant neoplasm of unspecified part of left bronchus or lung: Secondary | ICD-10-CM | POA: Insufficient documentation

## 2018-01-25 DIAGNOSIS — R1901 Right upper quadrant abdominal swelling, mass and lump: Secondary | ICD-10-CM | POA: Insufficient documentation

## 2018-01-25 DIAGNOSIS — Z923 Personal history of irradiation: Secondary | ICD-10-CM | POA: Insufficient documentation

## 2018-01-25 DIAGNOSIS — R918 Other nonspecific abnormal finding of lung field: Secondary | ICD-10-CM | POA: Diagnosis not present

## 2018-01-26 ENCOUNTER — Telehealth: Payer: Self-pay | Admitting: Internal Medicine

## 2018-01-26 NOTE — Telephone Encounter (Signed)
Called pt re appts that were changed for tomorrow - left vm for pt re appts.

## 2018-01-27 ENCOUNTER — Telehealth: Payer: Self-pay | Admitting: Oncology

## 2018-01-27 ENCOUNTER — Encounter: Payer: Self-pay | Admitting: Oncology

## 2018-01-27 ENCOUNTER — Inpatient Hospital Stay: Payer: BLUE CROSS/BLUE SHIELD

## 2018-01-27 ENCOUNTER — Inpatient Hospital Stay (HOSPITAL_BASED_OUTPATIENT_CLINIC_OR_DEPARTMENT_OTHER): Payer: BLUE CROSS/BLUE SHIELD | Admitting: Oncology

## 2018-01-27 ENCOUNTER — Other Ambulatory Visit: Payer: BLUE CROSS/BLUE SHIELD

## 2018-01-27 ENCOUNTER — Ambulatory Visit: Payer: BLUE CROSS/BLUE SHIELD | Admitting: Oncology

## 2018-01-27 ENCOUNTER — Ambulatory Visit: Payer: BLUE CROSS/BLUE SHIELD

## 2018-01-27 ENCOUNTER — Telehealth: Payer: Self-pay | Admitting: Emergency Medicine

## 2018-01-27 VITALS — BP 98/67 | HR 88 | Temp 97.7°F | Resp 17 | Ht 73.0 in | Wt 150.6 lb

## 2018-01-27 DIAGNOSIS — E876 Hypokalemia: Secondary | ICD-10-CM

## 2018-01-27 DIAGNOSIS — C3412 Malignant neoplasm of upper lobe, left bronchus or lung: Secondary | ICD-10-CM

## 2018-01-27 DIAGNOSIS — C3402 Malignant neoplasm of left main bronchus: Secondary | ICD-10-CM

## 2018-01-27 DIAGNOSIS — C3492 Malignant neoplasm of unspecified part of left bronchus or lung: Secondary | ICD-10-CM

## 2018-01-27 DIAGNOSIS — Z713 Dietary counseling and surveillance: Secondary | ICD-10-CM

## 2018-01-27 DIAGNOSIS — D6481 Anemia due to antineoplastic chemotherapy: Secondary | ICD-10-CM

## 2018-01-27 DIAGNOSIS — Z5112 Encounter for antineoplastic immunotherapy: Secondary | ICD-10-CM

## 2018-01-27 DIAGNOSIS — Z95828 Presence of other vascular implants and grafts: Secondary | ICD-10-CM

## 2018-01-27 DIAGNOSIS — C7931 Secondary malignant neoplasm of brain: Secondary | ICD-10-CM

## 2018-01-27 DIAGNOSIS — M549 Dorsalgia, unspecified: Secondary | ICD-10-CM

## 2018-01-27 DIAGNOSIS — K8689 Other specified diseases of pancreas: Secondary | ICD-10-CM

## 2018-01-27 LAB — CMP (CANCER CENTER ONLY)
ALBUMIN: 3.8 g/dL (ref 3.5–5.0)
ALT: 13 U/L (ref 0–44)
AST: 12 U/L — AB (ref 15–41)
Alkaline Phosphatase: 72 U/L (ref 38–126)
Anion gap: 11 (ref 5–15)
BILIRUBIN TOTAL: 0.4 mg/dL (ref 0.3–1.2)
BUN: 21 mg/dL (ref 8–23)
CO2: 26 mmol/L (ref 22–32)
CREATININE: 2.47 mg/dL — AB (ref 0.61–1.24)
Calcium: 9.5 mg/dL (ref 8.9–10.3)
Chloride: 98 mmol/L (ref 98–111)
GFR, EST NON AFRICAN AMERICAN: 26 mL/min — AB (ref >60)
GFR, Est AFR Am: 30 mL/min — ABNORMAL LOW (ref >60)
GLUCOSE: 161 mg/dL — AB (ref 70–99)
Potassium: 2.9 mmol/L — CL (ref 3.5–5.1)
Sodium: 135 mmol/L (ref 135–145)
TOTAL PROTEIN: 6.8 g/dL (ref 6.5–8.1)

## 2018-01-27 LAB — CBC WITH DIFFERENTIAL (CANCER CENTER ONLY)
BASOS PCT: 1 %
Basophils Absolute: 0 10*3/uL (ref 0.0–0.1)
Eosinophils Absolute: 0 10*3/uL (ref 0.0–0.5)
Eosinophils Relative: 1 %
HEMATOCRIT: 29.4 % — AB (ref 38.4–49.9)
HEMOGLOBIN: 10 g/dL — AB (ref 13.0–17.1)
LYMPHS PCT: 16 %
Lymphs Abs: 0.9 10*3/uL (ref 0.9–3.3)
MCH: 34.2 pg — ABNORMAL HIGH (ref 27.2–33.4)
MCHC: 34.1 g/dL (ref 32.0–36.0)
MCV: 100.4 fL — AB (ref 79.3–98.0)
MONO ABS: 0.4 10*3/uL (ref 0.1–0.9)
Monocytes Relative: 7 %
NEUTROS PCT: 75 %
Neutro Abs: 4.5 10*3/uL (ref 1.5–6.5)
Platelet Count: 111 10*3/uL — ABNORMAL LOW (ref 140–400)
RBC: 2.93 MIL/uL — AB (ref 4.20–5.82)
RDW: 18.2 % — AB (ref 11.0–14.6)
WBC: 5.9 10*3/uL (ref 4.0–10.3)

## 2018-01-27 LAB — TSH: TSH: 0.8 u[IU]/mL (ref 0.320–4.118)

## 2018-01-27 MED ORDER — SODIUM CHLORIDE 0.9 % IV SOLN
Freq: Once | INTRAVENOUS | Status: AC
Start: 2018-01-27 — End: 2018-01-27
  Administered 2018-01-27: 11:00:00 via INTRAVENOUS

## 2018-01-27 MED ORDER — SODIUM CHLORIDE 0.9% FLUSH
10.0000 mL | INTRAVENOUS | Status: DC | PRN
  Administered 2018-01-27: 10 mL via INTRAVENOUS
  Filled 2018-01-27: qty 10

## 2018-01-27 MED ORDER — HYDROCODONE-ACETAMINOPHEN 5-325 MG PO TABS: 1.0000 | ORAL_TABLET | Freq: Four times a day (QID) | ORAL | 0 refills | Status: DC | PRN

## 2018-01-27 MED ORDER — POTASSIUM CHLORIDE CRYS ER 20 MEQ PO TBCR: 20.0000 meq | EXTENDED_RELEASE_TABLET | Freq: Two times a day (BID) | ORAL | 0 refills | Status: DC

## 2018-01-27 MED ORDER — SODIUM CHLORIDE 0.9 % IV SOLN
1200.0000 mg | Freq: Once | INTRAVENOUS | Status: AC
  Administered 2018-01-27: 1200 mg via INTRAVENOUS
  Filled 2018-01-27: qty 20

## 2018-01-27 MED ORDER — HEPARIN SOD (PORK) LOCK FLUSH 100 UNIT/ML IV SOLN
500.0000 [IU] | Freq: Once | INTRAVENOUS | Status: AC | PRN
  Administered 2018-01-27: 500 [IU]
  Filled 2018-01-27: qty 5

## 2018-01-27 MED ORDER — SODIUM CHLORIDE 0.9% FLUSH
10.0000 mL | INTRAVENOUS | Status: DC | PRN
  Administered 2018-01-27: 10 mL
  Filled 2018-01-27: qty 10

## 2018-01-27 NOTE — Telephone Encounter (Signed)
Labs reviewed. Ok to tx per NP.

## 2018-01-27 NOTE — Progress Notes (Signed)
Per Julien Nordmann it is okay to treat pt today with Tencentriq and today"s  Creatinine.

## 2018-01-27 NOTE — Progress Notes (Signed)
[facility] OFFICE PROGRESS NOTE  [name], MD [address] [place] [address]  DIAGNOSIS: Extensive stage (T4, N2, M1b) small cell lung cancer presented with large left upper lobe lung mass with mediastinal invasion as well as pancreatic lesion and suspicious left iliac bone metastasis diagnosed in February 2018.  PRIOR THERAPY: 1) Systemic chemotherapy with carboplatin for AUC of 5 on day 1 and etoposide 100 MG/M2 on days 1, 2 and 3 with Neulasta support status post 6 cycles.Last dose of chemotherapy was given January 14, 2017 with partial response. This is concurrent with radiotherapy. This also concurrent with radiation to the large lung mass. 2) prophylactic cranial irradiation under the care of Dr. Sondra Come. 3)Systemic chemotherapy with carboplatin for AUC of 5 on day 1, etoposide 100 mg/M2 on days 1, 2 and 3, Tecentriq (Atezolizumab) 1200 mg IV every 3 weeks with Neulasta support. First dose October 28, 2017.Status post 1 cycle. Carboplatin and etoposide were stopped due to intolerance.  CURRENT THERAPY: Tecentriq 1200 mg IV every 3 weeks as a single agent.First cycle was started on4/24/2019.Status post 3 cycles.  INTERVAL HISTORY: Jesse Avila 67 y.o. male returns for routine follow-up visit accompanied by his wife and daughter.  The patient continues to complain of increasing fatigue and weakness.  The patient was also having increased dizziness since his last visit.  An MRI of the brain was performed which showed evidence of brain metastasis.  He is scheduled for follow-up with radiation oncology tomorrow for consideration of SRS.  Patient reports that his dizziness is worse when he tries to stand up.  He is not having much dizziness when sitting.  He denies headaches and visual changes.  She reports a decreased appetite and he has lost more weight since last visit.  He reports increased back pain in the middle of his back down to his lower  back.  Pain is requesting a refill for hydrocodone.  The patient denies fevers and chills.  Denies chest pain, shortness of breath, cough, hemoptysis.  Reports intermittent nausea but no vomiting, constipation, or diarrhea.  The patient had an MRI of the brain and CT scan of the chest, abdomen, pelvis and is here to discuss the results.  MEDICAL HISTORY: Past Medical History:  Diagnosis Date  . Atrial fibrillation ([place])   . Carotid artery occlusion   . DJD (degenerative joint disease) of knee   . Essential hypertension, benign    takes Benicar daily  . Goals of care, counseling/discussion 09/18/2016  . History of colon polyps    benign  . History of gout   . History of radiation therapy 09/30/16-11/10/16   left lung 60 Gy in 30 fractions  . Hyperlipidemia    takes Fenofibrate and Crestor daily  . Insomnia    takes Ambien nightly as needed  . Joint pain   . Lung cancer ([place])   . Nocturia   . Peripheral vascular disease ([place])   . Primary localized osteoarthritis of left knee 12/26/2014  . Stroke [facility]) 2015?   takes Plavix daily as well as Pletal,not on plavix at present 05/15/15  . 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  MEDICATIONS:  Current Outpatient Medications  Medication Sig Dispense Refill  . amiodarone (PACERONE) 200 MG tablet Take 1 tablet (200 mg total) by mouth daily. [place]  tablet 0  . dronabinol (MARINOL) 2.5 MG capsule Take 1 capsule (2.5 mg total) by mouth 2 (two) times daily before a meal. 60 capsule 0  . furosemide (LASIX) 40 MG tablet TAKE 1 TABLET BY MOUTH TWICE A DAY (Patient taking differently: TAKE 40 MG BY MOUTH TWICE A DAY) 60 tablet 6  . HYDROcodone-acetaminophen (NORCO) 5-325 MG tablet Take 1 tablet by mouth every 6 (six) hours as needed for moderate pain. 30 tablet 0  . insulin glargine (LANTUS) 100  UNIT/ML injection Inject 0.1 mLs (10 Units total) into the skin daily. (Patient taking differently: Inject 15 Units into the skin at bedtime. ) 10 mL 11  . lactulose (CHRONULAC) 10 GM/15ML solution Take 30 mLs (20 g total) by mouth daily as needed for mild constipation or moderate constipation. 480 mL 0  . lidocaine (XYLOCAINE) 2 % solution Use as directed 15 mLs in the mouth or throat 3 (three) times daily before meals. 100 mL 0  . lidocaine-prilocaine (EMLA) cream Apply 1 application topically as needed. 30 g 4  . OVER THE COUNTER MEDICATION Take 1 capsule by mouth daily. Hemp Extract Supplement    . pantoprazole (PROTONIX) 40 MG tablet Take 1 tablet (40 mg total) by mouth 2 (two) times daily for 15 days. 30 tablet 0  . pantoprazole (PROTONIX) 40 MG tablet Take 1 tablet (40 mg total) by mouth daily. 30 tablet 0  . potassium chloride SA (K-DUR,KLOR-CON) 20 MEQ tablet Take 1 tablet (20 mEq total) by mouth 2 (two) times daily. 60 tablet 0  . prochlorperazine (COMPAZINE) 10 MG tablet Take 1 tablet (10 mg total) by mouth every 6 (six) hours as needed for nausea or vomiting. 30 tablet 0  . spironolactone (ALDACTONE) 25 MG tablet Take 1 tablet (25 mg total) by mouth daily. 30 tablet 0  . sucralfate (CARAFATE) 1 GM/10ML suspension Take 10 mLs (1 g total) by mouth 4 (four) times daily - after meals and at bedtime. 420 mL 0  . zolpidem (AMBIEN) 10 MG tablet Take 10 mg by mouth at bedtime.      No current facility-administered medications for this visit.    Facility-Administered Medications Ordered in Other Visits  Medication Dose Route Frequency Provider Last Rate Last Dose  . sodium chloride flush (NS) 0.9 % injection 10 mL  10 mL Intracatheter PRN Curt Bears, MD   10 mL at 01/27/18 1305    SURGICAL HISTORY:  Past Surgical History:  Procedure Laterality Date  . ABDOMINAL AORTAGRAM N/A 03/29/2012   Procedure: ABDOMINAL Maxcine Ham;  Surgeon: Angelia Mould, MD;  Location: [facility] CATH LAB;   Service: Cardiovascular;  Laterality: N/A;  . CARDIOVERSION N/A 05/08/2017   Procedure: CARDIOVERSION;  Surgeon: Minna Merritts, MD;  Location: [facility] ORS;  Service: Cardiovascular;  Laterality: N/A;  . COLONOSCOPY    . ENDARTERECTOMY Right 05/09/2014   Procedure: RIGHT CAROTID ENDARTERECTOMY WITH PATCH ANGIOPLASTY;  Surgeon: Conrad [place], MD;  Location: [place];  Service: Vascular;  Laterality: Right;  . ENDARTERECTOMY FEMORAL Right 05/17/2015   Procedure: ENDARTERECTOMY FEMORAL;  Surgeon: Angelia Mould, MD;  Location: [place];  Service: Vascular;  Laterality: Right;  . ENDOBRONCHIAL ULTRASOUND Bilateral 09/08/2016   Procedure: ENDOBRONCHIAL ULTRASOUND;  Surgeon: Rigoberto Noel, MD;  Location: [facility] ENDOSCOPY;  Service: Cardiopulmonary;  Laterality: Bilateral;  . EUS N/A 10/08/2017   Procedure: UPPER ENDOSCOPIC ULTRASOUND (EUS) LINEAR;  Surgeon: [name], MD;  Location: [facility] ENDOSCOPY;  Service: Endoscopy;  Laterality: N/A;  . FEMORAL-POPLITEAL  BYPASS GRAFT Right 05/17/2015   Procedure: BYPASS GRAFT FEMORAL-BELOW KNEE POPLITEAL ARTERY, using gortex propaten graft 6 mm x 80 cm;  Surgeon: Angelia Mould, MD;  Location: [place];  Service: Vascular;  Laterality: Right;  . IR GENERIC HISTORICAL  09/25/2016   IR FLUORO GUIDE PORT INSERTION RIGHT 09/25/2016 Markus Daft, MD [facility]-INTERV RAD  . IR GENERIC HISTORICAL  09/25/2016   IR US GUIDE VASC ACCESS RIGHT 09/25/2016 Markus Daft, MD [facility]INTER[facility]  . KNEE ARTHROSCOPY Right 11/2009  . LOWER EXTREMITY ANGIOGRAM Bilateral 03/23/2015   Procedure: Lower Extremity Angiogram;  Surgeon: Angelia Mould, MD;  Location: [place] CV LAB;  Service: Cardiovascular;  Laterality: Bilateral;  . MENISCUS REPAIR  11/2009  . PARTIAL KNEE ARTHROPLASTY Left 12/26/2014   Procedure: UNICOMPARTMENTAL KNEE;  Surgeon: Marchia Bond, MD;  Location: [place];  Service: Orthopedics;  Laterality: Left;  . PATCH ANGIOPLASTY Right 05/17/2015   Procedure: VEIN PATCH ANGIOPLASTY  ILEOFEMORAL ARTERY;  Surgeon: Angelia Mould, MD;  Location: [place];  Service: Vascular;  Laterality: Right;  . PERCUTANEOUS STENT INTERVENTION N/A 05/10/2012   Procedure: PERCUTANEOUS STENT INTERVENTION;  Surgeon: Angelia Mould, MD;  Location: [facility] CATH LAB;  Service: Cardiovascular;  Laterality: N/A;  . PERIPHERAL VASCULAR CATHETERIZATION N/A 03/23/2015   Procedure: Abdominal Aortogram;  Surgeon: Angelia Mould, MD;  Location: [place] CV LAB;  Service: Cardiovascular;  Laterality: N/A;  . STENTS     PLACED IN ??BOTH LEGS   2013?  . TEE WITHOUT CARDIOVERSION N/A 03/06/2017   Procedure: TRANSESOPHAGEAL ECHOCARDIOGRAM (TEE);  Surgeon: Larey Dresser, MD;  Location: [facility] ENDOSCOPY;  Service: Cardiovascular;  Laterality: N/A;  . TEE WITHOUT CARDIOVERSION N/A 05/08/2017   Procedure: TRANSESOPHAGEAL ECHOCARDIOGRAM (TEE);  Surgeon: Minna Merritts, MD;  Location: [facility] ORS;  Service: Cardiovascular;  Laterality: N/A;    REVIEW OF SYSTEMS:   Review of Systems  Constitutional: Negative for chills, fever.  Positive for fatigue, decreased appetite, and weight loss.  HENT:   Negative for mouth sores, nosebleeds, sore throat and trouble swallowing.   Eyes: Negative for eye problems and icterus.  Respiratory: Negative for cough, hemoptysis, shortness of breath and wheezing.   Cardiovascular: Negative for chest pain and leg swelling.  Gastrointestinal: Negative for abdominal pain, constipation, diarrhea, and vomiting. Positive for intermittent nausea. Genitourinary: Negative for bladder incontinence, difficulty urinating, dysuria, frequency and hematuria.   Musculoskeletal: Negative for neck pain and neck stiffness. Positive for back pain. Skin: Negative for itching and rash.  Neurological: Negative for extremity weakness, headaches,  and seizures. Positive for dizziness. Hematological: Negative for adenopathy. Does not bruise/bleed easily.  Psychiatric/Behavioral: Negative for  confusion, depression and sleep disturbance. The patient is not nervous/anxious.     PHYSICAL EXAMINATION:  Blood pressure 98/67, pulse 88, temperature 97.7 F (36.5 C), temperature source Oral, resp. rate 17, height 6\' 1"  (1.854 m), weight 150 lb 9.6 oz (68.3 kg), SpO2 98 %.  ECOG PERFORMANCE STATUS: 2 - Symptomatic, <50% confined to bed  Physical Exam  Constitutional: Oriented to person, place, and time. No distress.  HENT:  Head: Normocephalic and atraumatic.  Mouth/Throat: Oropharynx is clear and moist. No oropharyngeal exudate.  Eyes: Conjunctivae are normal. Right eye exhibits no discharge. Left eye exhibits no discharge. No scleral icterus.  Neck: Normal range of motion. Neck supple.  Cardiovascular: Normal rate, regular rhythm, normal heart sounds and intact distal pulses.   Pulmonary/Chest: Effort normal and breath sounds normal. No respiratory distress. No wheezes. No rales.  Abdominal:  Soft. Bowel sounds are normal. Exhibits no distension and no mass. There is no tenderness.  Musculoskeletal: Normal range of motion. Exhibits no edema.  Lymphadenopathy:    No cervical adenopathy.  Neurological: Alert and oriented to person, place, and time. Exhibits normal muscle tone. Coordination normal.  Skin: Skin is warm and dry. No rash noted. Not diaphoretic. No erythema. No pallor.  Psychiatric: Mood, memory and judgment normal.  Vitals reviewed.  LABORATORY DATA: Lab Results  Component Value Date   WBC 5.9 01/27/2018   HGB 10.0 (L) 01/27/2018   HCT 29.4 (L) 01/27/2018   MCV 100.4 (H) 01/27/2018   PLT 111 (L) 01/27/2018      Chemistry      Component Value Date/Time   NA 135 01/27/2018 1010   NA 137 06/19/2017 0922   K 2.9 (LL) 01/27/2018 1010   K 4.1 06/19/2017 0922   CL 98 01/27/2018 1010   CO2 26 01/27/2018 1010   CO2 28 06/19/2017 0922   BUN 21 01/27/2018 1010   BUN 12.6 06/19/2017 0922   CREATININE 2.47 (H) 01/27/2018 1010   CREATININE 1.3 06/19/2017 0922       Component Value Date/Time   CALCIUM 9.5 01/27/2018 1010   CALCIUM 9.6 06/19/2017 0922   ALKPHOS 72 01/27/2018 1010   ALKPHOS 116 06/19/2017 0922   AST 12 (L) 01/27/2018 1010   AST 15 06/19/2017 0922   ALT 13 01/27/2018 1010   ALT 14 06/19/2017 0922   BILITOT 0.4 01/27/2018 1010   BILITOT 0.56 06/19/2017 0922       RADIOGRAPHIC STUDIES:  Ct Abdomen Pelvis Wo Contrast  Result Date: 01/26/2018 CLINICAL DATA:  Patient with history of lung cancer. Patient completed radiation therapy. Patient on chemotherapy. EXAM: CT CHEST, ABDOMEN AND PELVIS WITHOUT CONTRAST TECHNIQUE: Multidetector CT imaging of the chest, abdomen and pelvis was performed following the standard protocol without IV contrast. COMPARISON:  CT CAP 09/23/2017 FINDINGS: CT CHEST FINDINGS Cardiovascular: Right anterior chest wall Port-A-Cath is present with tip terminating in the superior vena cava. Thoracic aortic and coronary arterial vascular calcifications. Normal heart size. Trace fluid superior pericardial recess. Mediastinum/Nodes: No enlarged axillary, mediastinal or hilar lymphadenopathy. Normal appearance of the esophagus. Lungs/Pleura: The lung apices are excluded from view. Interval development of predominately ground-glass tree-in-bud nodular opacities throughout the right lower lobe. Redemonstrated bilateral pleural calcifications. Interval decrease in size of small left pleural effusion. Re demonstrated volume loss within the left hemithorax with associated bronchiectasis and postradiation changes, similar when compared to prior exam. No pneumothorax. Musculoskeletal: Thoracic spine degenerative changes. No aggressive or acute appearing osseous lesions. CT ABDOMEN PELVIS FINDINGS Hepatobiliary: Liver is normal in size and contour. Probable small amount of sludge within the gallbladder lumen. No intrahepatic or extrahepatic biliary ductal dilatation. Pancreas: Unremarkable Spleen: Unremarkable Adrenals/Urinary Tract: Normal  adrenal glands. Kidneys are symmetric in size. No hydronephrosis. Urinary bladder is unremarkable. Stomach/Bowel: Sigmoid colonic diverticulosis. No CT evidence for acute diverticulitis. Stool throughout the colon. No evidence for small bowel obstruction. Normal morphology of the stomach. Focal narrowing of the mid transverse colon (image 39; series 4), nonspecific however likely secondary to peristalsis. Vascular/Lymphatic: Normal caliber abdominal aorta. Peripheral calcified atherosclerotic plaque. Re demonstrated graft within the distal aorta and common iliac arteries. Interval increase in size of mass between the duodenum and uncinate process of the pancreas measuring 5.7 x 4.5 cm (image 64; series 2), previously 4.6 x 3.3 cm. Re demonstrated postsurgical changes within the right inguinal region. Reproductive: Central dystrophic calcifications in  the prostate. Other: None. Musculoskeletal: Lumbar spine degenerative changes. Patchy sclerosis in the left iliac bone (image 93; series 2) similar. IMPRESSION: 1. Similar-appearing postradiation changes within the left lung. No evidence for localized recurrence. 2. Interval increase in size of mass within the upper abdomen between the pancreatic head and duodenum, compatible with metastatic disease. 3. Predominately tree-in-bud ground-glass nodular opacities within the right lower lobe, new from prior, likely infectious/inflammatory in etiology. Recommend attention on follow-up. Electronically Signed   By: Lovey Newcomer M.D.   On: 01/26/2018 08:52   Dg Chest 2 View  Result Date: 12/29/2017 CLINICAL DATA:  Shortness of breath and cough. History metastatic small cell carcinoma of the lung. EXAM: CHEST - 2 VIEW COMPARISON:  Chest x-ray on 07/17/2017 CT of the chest on 09/23/2017 FINDINGS: The heart size is stable. Stable opacity in the left upper lung abutting the mediastinum and consistent with post radiation therapy changes. New patchy reticulonodular airspace opacity  in the right lower lobe is suspicious for acute pneumonia. Follow-up recommended given the nodular nature of this opacity and history of malignancy. No edema, pneumothorax or significant pleural fluid. Stable calcified pleural plaque at the diaphragmatic surface on the left. IMPRESSION: Findings suspicious for acute pneumonia of the right lower lobe. Follow-up recommended given nodular appearance of this opacity. Electronically Signed   By: Aletta Edouard M.D.   On: 12/29/2017 17:55   Ct Chest Wo Contrast  Result Date: 01/26/2018 CLINICAL DATA:  Patient with history of lung cancer. Patient completed radiation therapy. Patient on chemotherapy. EXAM: CT CHEST, ABDOMEN AND PELVIS WITHOUT CONTRAST TECHNIQUE: Multidetector CT imaging of the chest, abdomen and pelvis was performed following the standard protocol without IV contrast. COMPARISON:  CT CAP 09/23/2017 FINDINGS: CT CHEST FINDINGS Cardiovascular: Right anterior chest wall Port-A-Cath is present with tip terminating in the superior vena cava. Thoracic aortic and coronary arterial vascular calcifications. Normal heart size. Trace fluid superior pericardial recess. Mediastinum/Nodes: No enlarged axillary, mediastinal or hilar lymphadenopathy. Normal appearance of the esophagus. Lungs/Pleura: The lung apices are excluded from view. Interval development of predominately ground-glass tree-in-bud nodular opacities throughout the right lower lobe. Redemonstrated bilateral pleural calcifications. Interval decrease in size of small left pleural effusion. Re demonstrated volume loss within the left hemithorax with associated bronchiectasis and postradiation changes, similar when compared to prior exam. No pneumothorax. Musculoskeletal: Thoracic spine degenerative changes. No aggressive or acute appearing osseous lesions. CT ABDOMEN PELVIS FINDINGS Hepatobiliary: Liver is normal in size and contour. Probable small amount of sludge within the gallbladder lumen. No  intrahepatic or extrahepatic biliary ductal dilatation. Pancreas: Unremarkable Spleen: Unremarkable Adrenals/Urinary Tract: Normal adrenal glands. Kidneys are symmetric in size. No hydronephrosis. Urinary bladder is unremarkable. Stomach/Bowel: Sigmoid colonic diverticulosis. No CT evidence for acute diverticulitis. Stool throughout the colon. No evidence for small bowel obstruction. Normal morphology of the stomach. Focal narrowing of the mid transverse colon (image 39; series 4), nonspecific however likely secondary to peristalsis. Vascular/Lymphatic: Normal caliber abdominal aorta. Peripheral calcified atherosclerotic plaque. Re demonstrated graft within the distal aorta and common iliac arteries. Interval increase in size of mass between the duodenum and uncinate process of the pancreas measuring 5.7 x 4.5 cm (image 64; series 2), previously 4.6 x 3.3 cm. Re demonstrated postsurgical changes within the right inguinal region. Reproductive: Central dystrophic calcifications in the prostate. Other: None. Musculoskeletal: Lumbar spine degenerative changes. Patchy sclerosis in the left iliac bone (image 93; series 2) similar. IMPRESSION: 1. Similar-appearing postradiation changes within the left lung. No evidence for localized  recurrence. 2. Interval increase in size of mass within the upper abdomen between the pancreatic head and duodenum, compatible with metastatic disease. 3. Predominately tree-in-bud ground-glass nodular opacities within the right lower lobe, new from prior, likely infectious/inflammatory in etiology. Recommend attention on follow-up. Electronically Signed   By: Lovey Newcomer M.D.   On: 01/26/2018 08:52   Mr Brain Wo Contrast  Result Date: 01/15/2018 CLINICAL DATA:  Small cell lung cancer. Falling. Dizziness. Post completion initial treatment. EXAM: MRI HEAD WITHOUT CONTRAST TECHNIQUE: Multiplanar, multiecho pulse sequences of the brain and surrounding structures were obtained without  intravenous contrast. COMPARISON:  MRI head 02/23/2017 FINDINGS: Brain: Intravenous contrast not administered due to creatinine 2.7. 11 mm lesion in the right frontal periventricular white matter with ring-like restricted diffusion compatible with metastatic disease. No significant edema. White matter hyperintensity posterior to the lesion on the right compatible with chronic infarct. See diffusion-weighted imaging 04/14/2014 demonstrating acute infarct at that time. Chronic infarct right lateral cerebellum unchanged. No acute infarct. Negative for hemorrhage. Generalized atrophy.  Negative for hydrocephalus. Vascular: Normal arterial flow voids Skull and upper cervical spine: Negative Sinuses/Orbits: Negative Other: None IMPRESSION: 11 mm lesion in the right frontal periventricular white matter compatible with metastatic disease. No acute infarct.  No midline shift. These results will be called to the ordering clinician or representative by the Radiologist Assistant, and communication documented in the PACS or zVision Dashboard. Electronically Signed   By: Franchot Gallo M.D.   On: 01/15/2018 19:51     ASSESSMENT/PLAN:  Small cell lung carcinoma, left (HCC) This is a very pleasant 67 year old white male with extensive stage small cell lung cancer status post first-line systemic chemotherapy with carboplatin and etoposide for 6 cycles with partial response.  The patient had evidence for disease recurrence and he was a started on treatment again with carboplatin, etoposide and Tecentriq status post 1 cycle but he could not tolerate his systemic chemotherapy and this was discontinued.  He is currently undergoing treatment with single agent Tecentriq status post 3 cycles and has been tolerating this treatment well except for the persistent baseline fatigue, decreased appetite, and weight loss.  The patient had increased dizziness recently. He is here to discuss the results of the MRI of the brain and his  restaging CT scan of the chest, abdomen, pelvis.  The patient was seen with Dr. Julien Nordmann.  MRI of the brain was discussed with the patient and his family members which show an area assistant with metastatic disease.  Recommend that he keep his follow-up with radiation oncology as scheduled tomorrow for discussion of SRS.  CT scan results were reviewed with the patient and his wife which shows stable disease in the lungs, however, there is an increase in the size of the mass within the upper abdomen between the pancreatic head and duodenum.  We discussed with the patient his family that he has a poor tolerance of chemotherapy.  Since his disease is stable elsewhere, we will continue with Tecentriq without dose modification.  We will also ask radiation oncology to evaluate the patient for possible radiation to this mass.  I have sent a message to radiation oncology to discuss this with him at his visit tomorrow.  Labs are reviewed and renal function is stable with a creatinine of 2.47 and okay to proceed with cycle 4 of his treatment today as scheduled.  He will return in 3 weeks for evaluation prior to cycle #5 of Tecentriq.  The patient has hypokalemia.  He is on Lasix.  He currently takes potassium 20 mEq once a day.  A new prescription for K-Dur 20 mEq twice a day was sent to his pharmacy.  We will follow his CMET closely on repeat labs.  For his back pain, he was given a prescription for hydrocodone 5/325 1 every 6 hours as needed for severe pain #30 with no refills.   He was advised to call immediately if he has any concerning symptoms in the interval. The patient voices understanding of current disease status and treatment options and is in agreement with the current care plan.  All questions were answered. The patient knows to call the clinic with any problems, questions or concerns. We can certainly see the patient much sooner if necessary.   No orders of the defined types were placed in this  encounter.  Mikey Bussing, DNP, AGPCNP-BC, AOCNP 01/27/18  ADDENDUM: Hematology/Oncology Attending: I had a face-to-face encounter with the patient.  I recommended his care plan.  This is a very pleasant 68 years old white male with recurrent small cell lung cancer.  He was started again on treatment with carboplatin, etoposide and Tecentriq but unfortunately could not tolerate the chemotherapy and he is currently on single agent immunotherapy with Tecentriq.  He is tolerating this treatment a little bit better.  He was found on recent MRI of the brain to have solitary brain metastasis.  The patient was referred to Dr. Sondra Come for evaluation and treatment of this lesion.  He had repeat CT scan of the chest, abdomen and pelvis performed recently.  This scan showed increase and the pancreatic mass. I discussed the scan results with the patient and his family.  I recommended for the patient to see Dr. 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  We will continue him on Tecentriq for now since there is no other progressive lesion. He will proceed with cycle #4 today. We will see him back for follow-up visit in 3 weeks for evaluation before starting cycle #5. For the back pain he was given refill for hydrocodone. For the hypokalemia we will increase his dose of potassium chloride. The patient was advised to call immediately if he has any concerning symptoms in the interval.  Disclaimer: This note was dictated with voice recognition software. Similar sounding words can inadvertently be transcribed and may be missed upon review. Eilleen Kempf, MD 01/29/18

## 2018-01-27 NOTE — Patient Instructions (Signed)
Implanted Port Home Guide An implanted port is a type of central line that is placed under the skin. Central lines are used to provide IV access when treatment or nutrition needs to be given through a person's veins. Implanted ports are used for long-term IV access. An implanted port may be placed because:  You need IV medicine that would be irritating to the small veins in your hands or arms.  You need long-term IV medicines, such as antibiotics.  You need IV nutrition for a long period.  You need frequent blood draws for lab tests.  You need dialysis.  Implanted ports are usually placed in the chest area, but they can also be placed in the upper arm, the abdomen, or the leg. An implanted port has two main parts:  Reservoir. The reservoir is round and will appear as a small, raised area under your skin. The reservoir is the part where a needle is inserted to give medicines or draw blood.  Catheter. The catheter is a thin, flexible tube that extends from the reservoir. The catheter is placed into a large vein. Medicine that is inserted into the reservoir goes into the catheter and then into the vein.  How will I care for my incision site? Do not get the incision site wet. Bathe or shower as directed by your health care provider. How is my port accessed? Special steps must be taken to access the port:  Before the port is accessed, a numbing cream can be placed on the skin. This helps numb the skin over the port site.  Your health care provider uses a sterile technique to access the port. ? Your health care provider must put on a mask and sterile gloves. ? The skin over your port is cleaned carefully with an antiseptic and allowed to dry. ? The port is gently pinched between sterile gloves, and a needle is inserted into the port.  Only "non-coring" port needles should be used to access the port. Once the port is accessed, a blood return should be checked. This helps ensure that the port  is in the vein and is not clogged.  If your port needs to remain accessed for a constant infusion, a clear (transparent) bandage will be placed over the needle site. The bandage and needle will need to be changed every week, or as directed by your health care provider.  Keep the bandage covering the needle clean and dry. Do not get it wet. Follow your health care provider's instructions on how to take a shower or bath while the port is accessed.  If your port does not need to stay accessed, no bandage is needed over the port.  What is flushing? Flushing helps keep the port from getting clogged. Follow your health care provider's instructions on how and when to flush the port. Ports are usually flushed with saline solution or a medicine called heparin. The need for flushing will depend on how the port is used.  If the port is used for intermittent medicines or blood draws, the port will need to be flushed: ? After medicines have been given. ? After blood has been drawn. ? As part of routine maintenance.  If a constant infusion is running, the port may not need to be flushed.  How long will my port stay implanted? The port can stay in for as long as your health care provider thinks it is needed. When it is time for the port to come out, surgery will be   done to remove it. The procedure is similar to the one performed when the port was put in. When should I seek immediate medical care? When you have an implanted port, you should seek immediate medical care if:  You notice a bad smell coming from the incision site.  You have swelling, redness, or drainage at the incision site.  You have more swelling or pain at the port site or the surrounding area.  You have a fever that is not controlled with medicine.  This information is not intended to replace advice given to you by your health care provider. Make sure you discuss any questions you have with your health care provider. Document  Released: 07/21/2005 Document Revised: 12/27/2015 Document Reviewed: 03/28/2013 Elsevier Interactive Patient Education  2017 Elsevier Inc.  

## 2018-01-27 NOTE — Telephone Encounter (Signed)
3 cycles already scheduled per 6/26 los. -

## 2018-01-27 NOTE — Assessment & Plan Note (Signed)
This is a very pleasant 67 year old white male with extensive stage small cell lung cancer status post first-line systemic chemotherapy with carboplatin and etoposide for 6 cycles with partial response.  The patient had evidence for disease recurrence and he was a started on treatment again with carboplatin, etoposide and Tecentriq status post 1 cycle but he could not tolerate his systemic chemotherapy and this was discontinued.  He is currently undergoing treatment with single agent Tecentriq status post 3 cycles and has been tolerating this treatment well except for the persistent baseline fatigue, decreased appetite, and weight loss.  The patient had increased dizziness recently. He is here to discuss the results of the MRI of the brain and his restaging CT scan of the chest, abdomen, pelvis.  The patient was seen with Dr. Julien Nordmann.  MRI of the brain was discussed with the patient and his family members which show an area assistant with metastatic disease.  Recommend that he keep his follow-up with radiation oncology as scheduled tomorrow for discussion of SRS.  CT scan results were reviewed with the patient and his wife which shows stable disease in the lungs, however, there is an increase in the size of the mass within the upper abdomen between the pancreatic head and duodenum.  We discussed with the patient his family that he has a poor tolerance of chemotherapy.  Since his disease is stable elsewhere, we will continue with Tecentriq without dose modification.  We will also ask radiation oncology to evaluate the patient for possible radiation to this mass.  I have sent a message to radiation oncology to discuss this with him at his visit tomorrow.  Labs are reviewed and renal function is stable with a creatinine of 2.47 and okay to proceed with cycle 4 of his treatment today as scheduled.  He will return in 3 weeks for evaluation prior to cycle #5 of Tecentriq.  The patient has hypokalemia.  He is on  Lasix.  He currently takes potassium 20 mEq once a day.  A new prescription for K-Dur 20 mEq twice a day was sent to his pharmacy.  We will follow his CMET closely on repeat labs.  For his back pain, he was given a prescription for hydrocodone 5/325 1 every 6 hours as needed for severe pain #30 with no refills.   He was advised to call immediately if he has any concerning symptoms in the interval. The patient voices understanding of current disease status and treatment options and is in agreement with the current care plan.  All questions were answered. The patient knows to call the clinic with any problems, questions or concerns. We can certainly see the patient much sooner if necessary.

## 2018-01-27 NOTE — Patient Instructions (Signed)
[facility] Discharge Instructions for Patients Receiving Chemotherapy  Today you received the following chemotherapy agents: Tecentriq  To help prevent nausea and vomiting after your treatment, we encourage you to take your nausea medication as directed.   If you develop nausea and vomiting that is not controlled by your nausea medication, call the clinic.   BELOW ARE SYMPTOMS THAT SHOULD BE REPORTED IMMEDIATELY:  *FEVER GREATER THAN 100.5 F  *CHILLS WITH OR WITHOUT FEVER  NAUSEA AND VOMITING THAT IS NOT CONTROLLED WITH YOUR NAUSEA MEDICATION  *UNUSUAL SHORTNESS OF BREATH  *UNUSUAL BRUISING OR BLEEDING  TENDERNESS IN MOUTH AND THROAT WITH OR WITHOUT PRESENCE OF ULCERS  *URINARY PROBLEMS  *BOWEL PROBLEMS  UNUSUAL RASH Items with * indicate a potential emergency and should be followed up as soon as possible.  Feel free to call the clinic should you have any questions or concerns. The clinic phone number is [phone].  Please show the CHEMO ALERT CARD at check-in to the Emergency Department and triage nurse.   

## 2018-01-28 ENCOUNTER — Ambulatory Visit
Admission: RE | Admit: 2018-01-28 | Discharge: 2018-01-28 | Disposition: A | Discharge status: Home / Self Care | Payer: BLUE CROSS/BLUE SHIELD | Source: Ambulatory Visit

## 2018-01-28 ENCOUNTER — Other Ambulatory Visit: Payer: Self-pay

## 2018-01-28 ENCOUNTER — Other Ambulatory Visit: Payer: Self-pay | Admitting: Radiation Therapy

## 2018-01-28 ENCOUNTER — Encounter: Payer: Self-pay | Admitting: Radiation Oncology

## 2018-01-28 ENCOUNTER — Ambulatory Visit
Admission: RE | Admit: 2018-01-28 | Discharge: 2018-01-28 | Disposition: A | Discharge status: Home / Self Care | Payer: BLUE CROSS/BLUE SHIELD | Source: Ambulatory Visit | Attending: Radiation Oncology | Admitting: Radiation Oncology

## 2018-01-28 VITALS — BP 115/82 | HR 81 | Temp 99.5°F | Resp 20 | Ht 73.0 in | Wt 152.6 lb

## 2018-01-28 DIAGNOSIS — Z8673 Personal history of transient ischemic attack (TIA), and cerebral infarction without residual deficits: Secondary | ICD-10-CM | POA: Diagnosis not present

## 2018-01-28 DIAGNOSIS — J90 Pleural effusion, not elsewhere classified: Secondary | ICD-10-CM | POA: Diagnosis not present

## 2018-01-28 DIAGNOSIS — Z87891 Personal history of nicotine dependence: Secondary | ICD-10-CM | POA: Insufficient documentation

## 2018-01-28 DIAGNOSIS — C7949 Secondary malignant neoplasm of other parts of nervous system: Principal | ICD-10-CM

## 2018-01-28 DIAGNOSIS — C7931 Secondary malignant neoplasm of brain: Secondary | ICD-10-CM

## 2018-01-28 DIAGNOSIS — C3412 Malignant neoplasm of upper lobe, left bronchus or lung: Secondary | ICD-10-CM | POA: Insufficient documentation

## 2018-01-28 DIAGNOSIS — Z86010 Personal history of colonic polyps: Secondary | ICD-10-CM | POA: Diagnosis not present

## 2018-01-28 DIAGNOSIS — Z794 Long term (current) use of insulin: Secondary | ICD-10-CM | POA: Insufficient documentation

## 2018-01-28 DIAGNOSIS — R296 Repeated falls: Secondary | ICD-10-CM | POA: Diagnosis not present

## 2018-01-28 DIAGNOSIS — G4700 Insomnia, unspecified: Secondary | ICD-10-CM | POA: Insufficient documentation

## 2018-01-28 DIAGNOSIS — Z923 Personal history of irradiation: Secondary | ICD-10-CM | POA: Insufficient documentation

## 2018-01-28 DIAGNOSIS — J479 Bronchiectasis, uncomplicated: Secondary | ICD-10-CM | POA: Insufficient documentation

## 2018-01-28 DIAGNOSIS — E785 Hyperlipidemia, unspecified: Secondary | ICD-10-CM | POA: Insufficient documentation

## 2018-01-28 DIAGNOSIS — I739 Peripheral vascular disease, unspecified: Secondary | ICD-10-CM | POA: Insufficient documentation

## 2018-01-28 DIAGNOSIS — I4891 Unspecified atrial fibrillation: Secondary | ICD-10-CM | POA: Insufficient documentation

## 2018-01-28 DIAGNOSIS — I6529 Occlusion and stenosis of unspecified carotid artery: Secondary | ICD-10-CM | POA: Insufficient documentation

## 2018-01-28 DIAGNOSIS — M109 Gout, unspecified: Secondary | ICD-10-CM | POA: Diagnosis not present

## 2018-01-28 DIAGNOSIS — C3492 Malignant neoplasm of unspecified part of left bronchus or lung: Secondary | ICD-10-CM

## 2018-01-28 DIAGNOSIS — C7951 Secondary malignant neoplasm of bone: Secondary | ICD-10-CM | POA: Insufficient documentation

## 2018-01-28 DIAGNOSIS — Z79899 Other long term (current) drug therapy: Secondary | ICD-10-CM | POA: Insufficient documentation

## 2018-01-28 DIAGNOSIS — I10 Essential (primary) hypertension: Secondary | ICD-10-CM | POA: Diagnosis not present

## 2018-01-28 NOTE — Progress Notes (Addendum)
Radiation Oncology         [phone] ________________________________  Name: Jesse Avila        MRN: [id number]  Date of Service: 01/28/2018 DOB: 02-18-1951  CC:[name], MD  Curt Bears, MD     REFERRING PHYSICIAN: Curt Bears, MD   DIAGNOSIS: The encounter diagnosis was Small cell lung carcinoma, left ([place]).   HISTORY OF PRESENT ILLNESS: Jesse Avila is a 67 y.o. male seen at the request of Dr. Julien Nordmann and Mikey Bussing, NP for a history of small cell carcinoma.  According to his history it appears that he was diagnosed with extensive stage [id number] disease in February 2018 his disease is located in the left upper lobe and he had mediastinal invasion as well as adenopathy along the head of the pancreas and suspicious left iliac metastasis.  He went on to receive carboplatin etoposide for 6 cycles through June 2018 with a partial response in the setting of concurrent radiotherapy under the care of Dr. Randa Ngo.  He received PCI as well with Dr. Randa Ngo, and has been on systemic therapy with Tecentriq as he could not tolerate carboplatin and etoposide following his first dose in March 2019.  He continues his Tecentriq as a single agent. He recently had episodes of falling and dizziness which prompted an MRI of the brain on 01/15/2018.  This revealed an 11 mm lesion in the right frontal periventricular white matter with ringlike distribution and changes posterior to the lesion compatible with chronic infarct.  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  He also underwent CT abdomen and pelvis and chest on 01/26/2018 for restaging revealing an interval interval increase in the size of the mass between the duodenum and uncinate process of the pancreas measuring 5.7 x 3.5 cm and had previously been 4.6 x 3.3 cm in February 2019. He comes today to discuss options of radiotherapy for both sites.    PREVIOUS RADIATION THERAPY: Yes   03/09/2017 -03/20/2017:  Prophylactic Whole brain, 25 gray in 10 fractions   09/30/16-11/10/16: Left lung/ 60 Gy in 30 fractions  PAST MEDICAL HISTORY:  Past Medical History:  Diagnosis Date  . Atrial fibrillation ([place])   . Carotid artery occlusion   . DJD (degenerative joint disease) of knee   . Essential hypertension, benign    takes Benicar daily  . Goals of care, counseling/discussion 09/18/2016  . History of colon polyps    benign  . History of gout   . History of radiation therapy 09/30/16-11/10/16   left lung 60 Gy in 30 fractions  . Hyperlipidemia    takes Fenofibrate and Crestor daily  . Insomnia    takes Ambien nightly as needed  . Joint pain   . Lung cancer ([place])   . Nocturia   . Peripheral vascular disease ([place])   . Primary localized osteoarthritis of left knee 12/26/2014  . Stroke [facility]) 2015?   takes Plavix daily as well as Pletal,not on plavix at present 05/15/15  . 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 ABDOMINAL AORTAGRAM N/A 03/29/2012   Procedure: ABDOMINAL Maxcine Ham;  Surgeon: Angelia Mould, MD;  [facility] CATH LAB;  Service: Cardiovascular;  Laterality: N/A;  . CARDIOVERSION N/A 05/08/2017   Procedure: CARDIOVERSION;  Surgeon: Minna Merritts,  MD;  Location: [facility] ORS;  Service: Cardiovascular;  Laterality: N/A;  . COLONOSCOPY    . ENDARTERECTOMY Right 05/09/2014   Procedure: RIGHT CAROTID ENDARTERECTOMY WITH PATCH ANGIOPLASTY;  Surgeon: Conrad Lebanon, MD;  Location: [place];  Service: Vascular;  Laterality: Right;  . ENDARTERECTOMY FEMORAL Right 05/17/2015   Procedure: ENDARTERECTOMY FEMORAL;  Surgeon: Angelia Mould, MD;  Location: [place];  Service: Vascular;  Laterality: Right;  . ENDOBRONCHIAL ULTRASOUND Bilateral 09/08/2016   Procedure: ENDOBRONCHIAL ULTRASOUND;   Surgeon: Rigoberto Noel, MD;  Location: [facility] ENDOSCOPY;  Service: Cardiopulmonary;  Laterality: Bilateral;  . EUS N/A 10/08/2017   Procedure: UPPER ENDOSCOPIC ULTRASOUND (EUS) LINEAR;  Surgeon: [name], MD;  Location: [facility] ENDOSCOPY;  Service: Endoscopy;  Laterality: N/A;  . FEMORAL-POPLITEAL BYPASS GRAFT Right 05/17/2015   Procedure: BYPASS GRAFT FEMORAL-BELOW KNEE POPLITEAL ARTERY, using gortex propaten graft 6 mm x 80 cm;  Surgeon: Angelia Mould, MD;  Location: [place];  Service: Vascular;  Laterality: Right;  . IR GENERIC HISTORICAL  09/25/2016   IR FLUORO GUIDE PORT INSERTION RIGHT 09/25/2016 Markus Daft, MD [facility]INTER[facility] RAD  . IR GENERIC HISTORICAL  09/25/2016   IR US GUIDE VASC ACCESS RIGHT 09/25/2016 Markus Daft, MD [facility]  . KNEE ARTHROSCOPY Right 11/2009  . LOWER EXTREMITY ANGIOGRAM Bilateral 03/23/2015   Procedure: Lower Extremity Angiogram;  Surgeon: Angelia Mould, MD;  Location: [place] CV LAB;  Service: Cardiovascular;  Laterality: Bilateral;  . MENISCUS REPAIR  11/2009  . PARTIAL KNEE ARTHROPLASTY Left 12/26/2014   Procedure: UNICOMPARTMENTAL KNEE;  Surgeon: Marchia Bond, MD;  Location: [place];  Service: Orthopedics;  Laterality: Left;  . PATCH ANGIOPLASTY Right 05/17/2015   Procedure: VEIN PATCH ANGIOPLASTY ILEOFEMORAL ARTERY;  Surgeon: Angelia Mould, MD;  Location: [place];  Service: Vascular;  Laterality: Right;  . PERCUTANEOUS STENT INTERVENTION N/A 05/10/2012   Procedure: PERCUTANEOUS STENT INTERVENTION;  Surgeon: Angelia Mould, MD;  Location[facility] CATH LAB;  Service: Cardiovascular;  Laterality: N/A;  . PERIPHERAL VASCULAR CATHETERIZATION N/A 03/23/2015   Procedure: Abdominal Aortogram;  Surgeon: Angelia Mould, MD;  Location: [place] CV LAB;  Service: Cardiovascular;  Laterality: N/A;  . STENTS     PLACED IN ??BOTH LEGS   2013?  . TEE WITHOUT CARDIOVERSION N/A 03/06/2017   Procedure: TRANSESOPHAGEAL ECHOCARDIOGRAM (TEE);  Surgeon: Larey Dresser, MD;  Location: [facility] ENDOSCOPY;  Service: Cardiovascular;  Laterality: N/A;  . TEE WITHOUT CARDIOVERSION N/A 05/08/2017   Procedure: TRANSESOPHAGEAL ECHOCARDIOGRAM (TEE);  Surgeon: Minna Merritts, MD;  Location: [facility] ORS;  Service: Cardiovascular;  Laterality: N/A;     FAMILY HISTORY:  Family History  Problem Relation Age of Onset  . Diabetes Mother   . Hypertension Mother   . Heart disease Mother        Coronary Artery Bypass Graft  . Hyperlipidemia Mother   . Heart attack Mother   . Hypertension Father   . Hyperlipidemia Sister   . Stroke Sister      SOCIAL HISTORY:  reports that he has quit smoking. His smoking use included cigarettes. He has a 20.00 pack-year smoking history. He has never used smokeless tobacco. He reports that he drinks alcohol. He reports that he does not use drugs. The patient is married and lives in [place].     ALLERGIES: Cialis [tadalafil]   MEDICATIONS:  Current Outpatient Medications  Medication Sig Dispense Refill  . amiodarone (PACERONE) 200 MG tablet Take 1 tablet (200 mg total) by mouth  daily. 90 tablet 0  . dronabinol (MARINOL) 2.5 MG capsule Take 1 capsule (2.5 mg total) by mouth 2 (two) times daily before a meal. 60 capsule 0  . furosemide (LASIX) 40 MG tablet TAKE 1 TABLET BY MOUTH TWICE A DAY (Patient taking differently: TAKE 40 MG BY MOUTH TWICE A DAY) 60 tablet 6  . HYDROcodone-acetaminophen (NORCO) 5-325 MG tablet Take 1 tablet by mouth every 6 (six) hours as needed for moderate pain. 30 tablet 0  . insulin glargine (LANTUS) 100 UNIT/ML injection Inject 0.1 mLs (10 Units total) into the skin daily. (Patient taking differently: Inject 15 Units into the skin at bedtime. ) 10 mL 11  . lactulose (CHRONULAC) 10 GM/15ML solution Take 30 mLs (20 g total) by mouth daily as needed for mild constipation or moderate constipation. 480 mL 0  . lidocaine (XYLOCAINE) 2 % solution Use as directed 15 mLs in the mouth or throat 3 (three) times daily  before meals. 100 mL 0  . lidocaine-prilocaine (EMLA) cream Apply 1 application topically as needed. 30 g 4  . OVER THE COUNTER MEDICATION Take 1 capsule by mouth daily. Hemp Extract Supplement    . pantoprazole (PROTONIX) 40 MG tablet Take 1 tablet (40 mg total) by mouth 2 (two) times daily for 15 days. 30 tablet 0  . pantoprazole (PROTONIX) 40 MG tablet Take 1 tablet (40 mg total) by mouth daily. 30 tablet 0  . potassium chloride SA (K-DUR,KLOR-CON) 20 MEQ tablet Take 1 tablet (20 mEq total) by mouth 2 (two) times daily. 60 tablet 0  . prochlorperazine (COMPAZINE) 10 MG tablet Take 1 tablet (10 mg total) by mouth every 6 (six) hours as needed for nausea or vomiting. 30 tablet 0  . spironolactone (ALDACTONE) 25 MG tablet Take 1 tablet (25 mg total) by mouth daily. 30 tablet 0  . sucralfate (CARAFATE) 1 GM/10ML suspension Take 10 mLs (1 g total) by mouth 4 (four) times daily - after meals and at bedtime. 420 mL 0  . zolpidem (AMBIEN) 10 MG tablet Take 10 mg by mouth at bedtime.      No current facility-administered medications for this encounter.      REVIEW OF SYSTEMS: On review of systems, the patient reports that he is doing okay. He denies any falls in the last few days but does experience regular episodes of dizziness when standing. He denies any chest pain, shortness of breath, cough, fevers, chills, night sweats, unintended weight changes. He denies any behavioral changes or weakness. He denies any bowel or bladder disturbances, and denies abdominal pain, nausea or vomiting. He denies any new musculoskeletal or joint aches or pains. A complete review of systems is obtained and is otherwise negative.     PHYSICAL EXAM:  Wt Readings from Last 3 Encounters:  01/27/18 150 lb 9.6 oz (68.3 kg)  01/06/18 153 lb 12.8 oz (69.8 kg)  12/29/17 160 lb 12.8 oz (72.9 kg)   Temp Readings from Last 3 Encounters:  01/27/18 97.7 F (36.5 C) (Oral)  01/06/18 97.6 F (36.4 C) (Oral)  12/29/17 (!)  97.5 F (36.4 C) (Oral)   BP Readings from Last 3 Encounters:  01/27/18 98/67  01/06/18 109/73  12/29/17 106/69   Pulse Readings from Last 3 Encounters:  01/27/18 88  01/06/18 91  12/29/17 90    In general this is a well appearing caucasian male in no acute distress. He's alert and oriented x4 and appropriate throughout the examination. Cardiopulmonary assessment is negative for acute distress  and he exhibits normal effort. The patient appears to be neurologically intact without any focal deficits.    ECOG = 1  0 - Asymptomatic (Fully active, able to carry on all predisease activities without restriction)  1 - Symptomatic but completely ambulatory (Restricted in physically strenuous activity but ambulatory and able to carry out work of a light or sedentary nature. For example, light housework, office work)  2 - Symptomatic, <50% in bed during the day (Ambulatory and capable of all self care but unable to carry out any work activities. Up and about more than 50% of waking hours)  3 - Symptomatic, >50% in bed, but not bedbound (Capable of only limited self-care, confined to bed or chair 50% or more of waking hours)  4 - Bedbound (Completely disabled. Cannot carry on any self-care. Totally confined to bed or chair)  5 - Death   Eustace Pen MM, Creech RH, Tormey DC, et al. [phone]). "Toxicity and response criteria of the [facility]". [place] Oncol. 5 (6): 649-55    LABORATORY DATA:  Lab Results  Component Value Date   WBC 5.9 01/27/2018   HGB 10.0 (L) 01/27/2018   HCT 29.4 (L) 01/27/2018   MCV 100.4 (H) 01/27/2018   PLT 111 (L) 01/27/2018   Lab Results  Component Value Date   NA 135 01/27/2018   K 2.9 (LL) 01/27/2018   CL 98 01/27/2018   CO2 26 01/27/2018   Lab Results  Component Value Date   ALT 13 01/27/2018   AST 12 (L) 01/27/2018   ALKPHOS 72 01/27/2018   BILITOT 0.4 01/27/2018      RADIOGRAPHY: Ct Abdomen Pelvis Wo Contrast  Result  Date: 01/26/2018 CLINICAL DATA:  Patient with history of lung cancer. Patient completed radiation therapy. Patient on chemotherapy. EXAM: CT CHEST, ABDOMEN AND PELVIS WITHOUT CONTRAST TECHNIQUE: Multidetector CT imaging of the chest, abdomen and pelvis was performed following the standard protocol without IV contrast. COMPARISON:  CT CAP 09/23/2017 FINDINGS: CT CHEST FINDINGS Cardiovascular: Right anterior chest wall Port-A-Cath is present with tip terminating in the superior vena cava. Thoracic aortic and coronary arterial vascular calcifications. Normal heart size. Trace fluid superior pericardial recess. Mediastinum/Nodes: No enlarged axillary, mediastinal or hilar lymphadenopathy. Normal appearance of the esophagus. Lungs/Pleura: The lung apices are excluded from view. Interval development of predominately ground-glass tree-in-bud nodular opacities throughout the right lower lobe. Redemonstrated bilateral pleural calcifications. Interval decrease in size of small left pleural effusion. Re demonstrated volume loss within the left hemithorax with associated bronchiectasis and postradiation changes, similar when compared to prior exam. No pneumothorax. Musculoskeletal: Thoracic spine degenerative changes. No aggressive or acute appearing osseous lesions. CT ABDOMEN PELVIS FINDINGS Hepatobiliary: Liver is normal in size and contour. Probable small amount of sludge within the gallbladder lumen. No intrahepatic or extrahepatic biliary ductal dilatation. Pancreas: Unremarkable Spleen: Unremarkable Adrenals/Urinary Tract: Normal adrenal glands. Kidneys are symmetric in size. No hydronephrosis. Urinary bladder is unremarkable. Stomach/Bowel: Sigmoid colonic diverticulosis. No CT evidence for acute diverticulitis. Stool throughout the colon. No evidence for small bowel obstruction. Normal morphology of the stomach. Focal narrowing of the mid transverse colon (image 39; series 4), nonspecific however likely secondary to  peristalsis. Vascular/Lymphatic: Normal caliber abdominal aorta. Peripheral calcified atherosclerotic plaque. Re demonstrated graft within the distal aorta and common iliac arteries. Interval increase in size of mass between the duodenum and uncinate process of the pancreas measuring 5.7 x 4.5 cm (image 64; series 2), previously 4.6 x 3.3 cm. Re demonstrated postsurgical  changes within the right inguinal region. Reproductive: Central dystrophic calcifications in the prostate. Other: None. Musculoskeletal: Lumbar spine degenerative changes. Patchy sclerosis in the left iliac bone (image 93; series 2) similar. IMPRESSION: 1. Similar-appearing postradiation changes within the left lung. No evidence for localized recurrence. 2. Interval increase in size of mass within the upper abdomen between the pancreatic head and duodenum, compatible with metastatic disease. 3. Predominately tree-in-bud ground-glass nodular opacities within the right lower lobe, new from prior, likely infectious/inflammatory in etiology. Recommend attention on follow-up. Electronically Signed   By: Lovey Newcomer M.D.   On: 01/26/2018 08:52   Dg Chest 2 View  Result Date: 12/29/2017 CLINICAL DATA:  Shortness of breath and cough. History metastatic small cell carcinoma of the lung. EXAM: CHEST - 2 VIEW COMPARISON:  Chest x-ray on 07/17/2017 CT of the chest on 09/23/2017 FINDINGS: The heart size is stable. Stable opacity in the left upper lung abutting the mediastinum and consistent with post radiation therapy changes. New patchy reticulonodular airspace opacity in the right lower lobe is suspicious for acute pneumonia. Follow-up recommended given the nodular nature of this opacity and history of malignancy. No edema, pneumothorax or significant pleural fluid. Stable calcified pleural plaque at the diaphragmatic surface on the left. IMPRESSION: Findings suspicious for acute pneumonia of the right lower lobe. Follow-up recommended given nodular  appearance of this opacity. Electronically Signed   By: Aletta Edouard M.D.   On: 12/29/2017 17:55   Ct Chest Wo Contrast  Result Date: 01/26/2018 CLINICAL DATA:  Patient with history of lung cancer. Patient completed radiation therapy. Patient on chemotherapy. EXAM: CT CHEST, ABDOMEN AND PELVIS WITHOUT CONTRAST TECHNIQUE: Multidetector CT imaging of the chest, abdomen and pelvis was performed following the standard protocol without IV contrast. COMPARISON:  CT CAP 09/23/2017 FINDINGS: CT CHEST FINDINGS Cardiovascular: Right anterior chest wall Port-A-Cath is present with tip terminating in the superior vena cava. Thoracic aortic and coronary arterial vascular calcifications. Normal heart size. Trace fluid superior pericardial recess. Mediastinum/Nodes: No enlarged axillary, mediastinal or hilar lymphadenopathy. Normal appearance of the esophagus. Lungs/Pleura: The lung apices are excluded from view. Interval development of predominately ground-glass tree-in-bud nodular opacities throughout the right lower lobe. Redemonstrated bilateral pleural calcifications. Interval decrease in size of small left pleural effusion. Re demonstrated volume loss within the left hemithorax with associated bronchiectasis and postradiation changes, similar when compared to prior exam. No pneumothorax. Musculoskeletal: Thoracic spine degenerative changes. No aggressive or acute appearing osseous lesions. CT ABDOMEN PELVIS FINDINGS Hepatobiliary: Liver is normal in size and contour. Probable small amount of sludge within the gallbladder lumen. No intrahepatic or extrahepatic biliary ductal dilatation. Pancreas: Unremarkable Spleen: Unremarkable Adrenals/Urinary Tract: Normal adrenal glands. Kidneys are symmetric in size. No hydronephrosis. Urinary bladder is unremarkable. Stomach/Bowel: Sigmoid colonic diverticulosis. No CT evidence for acute diverticulitis. Stool throughout the colon. No evidence for small bowel obstruction. Normal  morphology of the stomach. Focal narrowing of the mid transverse colon (image 39; series 4), nonspecific however likely secondary to peristalsis. Vascular/Lymphatic: Normal caliber abdominal aorta. Peripheral calcified atherosclerotic plaque. Re demonstrated graft within the distal aorta and common iliac arteries. Interval increase in size of mass between the duodenum and uncinate process of the pancreas measuring 5.7 x 4.5 cm (image 64; series 2), previously 4.6 x 3.3 cm. Re demonstrated postsurgical changes within the right inguinal region. Reproductive: Central dystrophic calcifications in the prostate. Other: None. Musculoskeletal: Lumbar spine degenerative changes. Patchy sclerosis in the left iliac bone (image 93; series 2) similar. IMPRESSION: 1.  Similar-appearing postradiation changes within the left lung. No evidence for localized recurrence. 2. Interval increase in size of mass within the upper abdomen between the pancreatic head and duodenum, compatible with metastatic disease. 3. Predominately tree-in-bud ground-glass nodular opacities within the right lower lobe, new from prior, likely infectious/inflammatory in etiology. Recommend attention on follow-up. Electronically Signed   By: Lovey Newcomer M.D.   On: 01/26/2018 08:52   Mr Brain Wo Contrast  Result Date: 01/15/2018 CLINICAL DATA:  Small cell lung cancer. Falling. Dizziness. Post completion initial treatment. EXAM: MRI HEAD WITHOUT CONTRAST TECHNIQUE: Multiplanar, multiecho pulse sequences of the brain and surrounding structures were obtained without intravenous contrast. COMPARISON:  MRI head 02/23/2017 FINDINGS: Brain: Intravenous contrast not administered due to creatinine 2.7. 11 mm lesion in the right frontal periventricular white matter with ring-like restricted diffusion compatible with metastatic disease. No significant edema. White matter hyperintensity posterior to the lesion on the right compatible with chronic infarct. See  diffusion-weighted imaging 04/14/2014 demonstrating acute infarct at that time. Chronic infarct right lateral cerebellum unchanged. No acute infarct. Negative for hemorrhage. Generalized atrophy.  Negative for hydrocephalus. Vascular: Normal arterial flow voids Skull and upper cervical spine: Negative Sinuses/Orbits: Negative Other: None IMPRESSION: 11 mm lesion in the right frontal periventricular white matter compatible with metastatic disease. No acute infarct.  No midline shift. These results will be called to the ordering clinician or representative by the Radiologist Assistant, and communication documented in the PACS or zVision Dashboard. Electronically Signed   By: Franchot Gallo M.D.   On: 01/15/2018 19:51       IMPRESSION/PLAN: 1. Progressive Metastatic Extensive Stage Small Cell Carcinoma of the LUL with adenopathy along the pancreatic head and single metastatic lesion in the brain. Dr. Lisbeth Renshaw discusses the pathology findings and reviews the nature of small cell carcinoma of the lung and salvage options to the brain following PCI as well as palliative therapy to the pancreatic head. We discussed the risks, benefits, short, and long term effects of radiotherapy, and the patient is interested in proceeding. Dr. Lisbeth Renshaw discusses the delivery and logistics of radiotherapy and anticipates a course of 1 fraction to the brain with stereotactic radiosurgery approach, as well as 2 weeks of palliative radiotherapy to the pancreatic head. Written consent is obtained and placed in the chart, a copy was provided to the patient. We will contact him to return for simulation.   In a visit lasting 60 minutes, greater than 50% of the time was spent face to face discussing his case, and coordinating the patient's care.  The above documentation reflects my direct findings during this shared patient visit. Please see the separate note by Dr. Lisbeth Renshaw on this date for the remainder of the patient's plan of  care.    Carola Rhine, PAC

## 2018-01-29 ENCOUNTER — Telehealth: Payer: Self-pay

## 2018-01-29 ENCOUNTER — Other Ambulatory Visit: Payer: Self-pay | Admitting: Radiation Therapy

## 2018-01-29 ENCOUNTER — Telehealth: Payer: Self-pay | Admitting: Radiation Therapy

## 2018-01-29 ENCOUNTER — Encounter: Payer: Self-pay | Admitting: *Deleted

## 2018-01-29 DIAGNOSIS — C7931 Secondary malignant neoplasm of brain: Secondary | ICD-10-CM

## 2018-01-29 DIAGNOSIS — C7949 Secondary malignant neoplasm of other parts of nervous system: Principal | ICD-10-CM

## 2018-01-29 NOTE — Telephone Encounter (Signed)
Received phone call from Manuela Schwartz in Radiation Oncology that pt EGFR low and interest in having pt come in for IVF prior to MRI on 7/6. Pt scheduled in [facility] on 7/5 at 9am for 2L NS over 2 hours per Dr. Lisbeth Renshaw. Lab appt in RadOnc post to re-evaluate EGFR status. Manuela Schwartz to call pt to notify of appts.

## 2018-01-29 NOTE — Progress Notes (Signed)
[place] Work Clinical Social Work received referral for billing questions.  CSW contacted patient by phone.  Jesse Avila is requesting assistance paying bills and setting up payment plan.  CSW explained to patient he would need to set up plan directly with billing office.  Patient verbalized understanding.  CSW provided information for [place] program to determine if there are better insurance coverage options for him in the future.   Kennith Center, LCSW  Clinical Social Worker [facility]

## 2018-01-29 NOTE — Telephone Encounter (Addendum)
Spoke with [name] to let him know about his upcoming appointments.   IV Fluids 7/5 @ 9:00am followed by STAT labs to recheck eGFR for MRI contrast Brain MRI 7/6 [facility], [address] location - check in at 11:30am. [facility] Tuesday 7/9 @ 11:00.   I let [name] know that I am still working on the actual treatment date and consult with a neurosurgeon. As soon as I have those appointments scheduled, I will give him a call back.  [name] is happy with these appointments and has my contact information in case he has future questions or concerns.  Jesse Avila R.T.(R)(T) Special Procedures Navigator [phone]

## 2018-01-31 ENCOUNTER — Other Ambulatory Visit: Payer: Self-pay | Admitting: Cardiovascular Disease

## 2018-02-03 ENCOUNTER — Other Ambulatory Visit: Payer: Self-pay | Admitting: Internal Medicine

## 2018-02-05 ENCOUNTER — Ambulatory Visit
Admission: RE | Admit: 2018-02-05 | Discharge: 2018-02-05 | Disposition: A | Discharge status: Home / Self Care | Payer: BLUE CROSS/BLUE SHIELD | Source: Ambulatory Visit | Attending: Internal Medicine | Admitting: Internal Medicine

## 2018-02-05 ENCOUNTER — Other Ambulatory Visit: Payer: Self-pay | Admitting: Radiation Oncology

## 2018-02-05 ENCOUNTER — Ambulatory Visit: Payer: BLUE CROSS/BLUE SHIELD

## 2018-02-05 ENCOUNTER — Inpatient Hospital Stay: Payer: BLUE CROSS/BLUE SHIELD | Attending: Internal Medicine

## 2018-02-05 VITALS — BP 105/75 | HR 89 | Temp 98.2°F | Resp 18

## 2018-02-05 DIAGNOSIS — C7949 Secondary malignant neoplasm of other parts of nervous system: Secondary | ICD-10-CM

## 2018-02-05 DIAGNOSIS — C3492 Malignant neoplasm of unspecified part of left bronchus or lung: Secondary | ICD-10-CM | POA: Diagnosis not present

## 2018-02-05 DIAGNOSIS — C3412 Malignant neoplasm of upper lobe, left bronchus or lung: Secondary | ICD-10-CM | POA: Diagnosis not present

## 2018-02-05 DIAGNOSIS — Z510 Encounter for antineoplastic radiation therapy: Secondary | ICD-10-CM | POA: Insufficient documentation

## 2018-02-05 DIAGNOSIS — E860 Dehydration: Secondary | ICD-10-CM | POA: Insufficient documentation

## 2018-02-05 DIAGNOSIS — C7931 Secondary malignant neoplasm of brain: Secondary | ICD-10-CM

## 2018-02-05 DIAGNOSIS — C3402 Malignant neoplasm of left main bronchus: Secondary | ICD-10-CM

## 2018-02-05 DIAGNOSIS — Z95828 Presence of other vascular implants and grafts: Secondary | ICD-10-CM

## 2018-02-05 LAB — BUN & CREATININE (CHCC)
BUN: 22 mg/dL (ref 8–23)
Creatinine: 2.23 mg/dL — ABNORMAL HIGH (ref 0.61–1.24)
GFR, EST AFRICAN AMERICAN: 34 mL/min — AB (ref >60)
GFR, Estimated: 29 mL/min — ABNORMAL LOW (ref >60)

## 2018-02-05 MED ORDER — SODIUM CHLORIDE 0.9 % IV SOLN
1500.0000 mL | Freq: Once | INTRAVENOUS | Status: AC
  Filled 2018-02-05: qty 1500

## 2018-02-05 MED ORDER — SODIUM CHLORIDE 0.9% FLUSH
10.0000 mL | INTRAVENOUS | Status: DC | PRN
  Administered 2018-02-05: 10 mL via INTRAVENOUS
  Filled 2018-02-05: qty 10

## 2018-02-05 MED ORDER — HEPARIN SOD (PORK) LOCK FLUSH 100 UNIT/ML IV SOLN
500.0000 [IU] | Freq: Once | INTRAVENOUS | Status: AC | PRN
  Administered 2018-02-05: 500 [IU] via INTRAVENOUS
  Filled 2018-02-05: qty 5

## 2018-02-05 MED ORDER — SODIUM CHLORIDE 0.9 % IV SOLN
Freq: Once | INTRAVENOUS | Status: AC
  Administered 2018-02-05: 10:00:00 via INTRAVENOUS

## 2018-02-05 NOTE — Patient Instructions (Signed)
Dehydration, Adult Dehydration is a condition in which there is not enough fluid or water in the body. This happens when you lose more fluids than you take in. Important organs, such as the kidneys, brain, and heart, cannot function without a proper amount of fluids. Any loss of fluids from the body can lead to dehydration. Dehydration can range from mild to severe. This condition should be treated right away to prevent it from becoming severe. What are the causes? This condition may be caused by:  Vomiting.  Diarrhea.  Excessive sweating, such as from heat exposure or exercise.  Not drinking enough fluid, especially: ? When ill. ? While doing activity that requires a lot of energy.  Excessive urination.  Fever.  Infection.  Certain medicines, such as medicines that cause the body to lose excess fluid (diuretics).  Inability to access safe drinking water.  Reduced physical ability to get adequate water and food.  What increases the risk? This condition is more likely to develop in people:  Who have a poorly controlled long-term (chronic) illness, such as diabetes, heart disease, or kidney disease.  Who are age 65 or older.  Who are disabled.  Who live in a place with high altitude.  Who play endurance sports.  What are the signs or symptoms? Symptoms of mild dehydration may include:  Thirst.  Dry lips.  Slightly dry mouth.  Dry, warm skin.  Dizziness. Symptoms of moderate dehydration may include:  Very dry mouth.  Muscle cramps.  Dark urine. Urine may be the color of tea.  Decreased urine production.  Decreased tear production.  Heartbeat that is irregular or faster than normal (palpitations).  Headache.  Light-headedness, especially when you stand up from a sitting position.  Fainting (syncope). Symptoms of severe dehydration may include:  Changes in skin, such as: ? Cold and clammy skin. ? Blotchy (mottled) or pale skin. ? Skin that does  not quickly return to normal after being lightly pinched and released (poor skin turgor).  Changes in body fluids, such as: ? Extreme thirst. ? No tear production. ? Inability to sweat when body temperature is high, such as in hot weather. ? Very little urine production.  Changes in vital signs, such as: ? Weak pulse. ? Pulse that is more than 100 beats a minute when sitting still. ? Rapid breathing. ? Low blood pressure.  Other changes, such as: ? Sunken eyes. ? Cold hands and feet. ? Confusion. ? Lack of energy (lethargy). ? Difficulty waking up from sleep. ? Short-term weight loss. ? Unconsciousness. How is this diagnosed? This condition is diagnosed based on your symptoms and a physical exam. Blood and urine tests may be done to help confirm the diagnosis. How is this treated? Treatment for this condition depends on the severity. Mild or moderate dehydration can often be treated at home. Treatment should be started right away. Do not wait until dehydration becomes severe. Severe dehydration is an emergency and it needs to be treated in a hospital. Treatment for mild dehydration may include:  Drinking more fluids.  Replacing salts and minerals in your blood (electrolytes) that you may have lost. Treatment for moderate dehydration may include:  Drinking an oral rehydration solution (ORS). This is a drink that helps you replace fluids and electrolytes (rehydrate). It can be found at pharmacies and retail stores. Treatment for severe dehydration may include:  Receiving fluids through an IV tube.  Receiving an electrolyte solution through a feeding tube that is passed through your nose   and into your stomach (nasogastric tube, or NG tube).  Correcting any abnormalities in electrolytes.  Treating the underlying cause of dehydration. Follow these instructions at home:  If directed by your health care provider, drink an ORS: ? Make an ORS by following instructions on the  package. ? Start by drinking small amounts, about  cup (120 mL) every 5-10 minutes. ? Slowly increase how much you drink until you have taken the amount recommended by your health care provider.  Drink enough clear fluid to keep your urine clear or pale yellow. If you were told to drink an ORS, finish the ORS first, then start slowly drinking other clear fluids. Drink fluids such as: ? Water. Do not drink only water. Doing that can lead to having too little salt (sodium) in the body (hyponatremia). ? Ice chips. ? Fruit juice that you have added water to (diluted fruit juice). ? Low-calorie sports drinks.  Avoid: ? Alcohol. ? Drinks that contain a lot of sugar. These include high-calorie sports drinks, fruit juice that is not diluted, and soda. ? Caffeine. ? Foods that are greasy or contain a lot of fat or sugar.  Take over-the-counter and prescription medicines only as told by your health care provider.  Do not take sodium tablets. This can lead to having too much sodium in the body (hypernatremia).  Eat foods that contain a healthy balance of electrolytes, such as bananas, oranges, potatoes, tomatoes, and spinach.  Keep all follow-up visits as told by your health care provider. This is important. Contact a health care provider if:  You have abdominal pain that: ? Gets worse. ? Stays in one area (localizes).  You have a rash.  You have a stiff neck.  You are more irritable than usual.  You are sleepier or more difficult to wake up than usual.  You feel weak or dizzy.  You feel very thirsty.  You have urinated only a small amount of very dark urine over 6-8 hours. Get help right away if:  You have symptoms of severe dehydration.  You cannot drink fluids without vomiting.  Your symptoms get worse with treatment.  You have a fever.  You have a severe headache.  You have vomiting or diarrhea that: ? Gets worse. ? Does not go away.  You have blood or green matter  (bile) in your vomit.  You have blood in your stool. This may cause stool to look black and tarry.  You have not urinated in 6-8 hours.  You faint.  Your heart rate while sitting still is over 100 beats a minute.  You have trouble breathing. This information is not intended to replace advice given to you by your health care provider. Make sure you discuss any questions you have with your health care provider. Document Released: 07/21/2005 Document Revised: 02/15/2016 Document Reviewed: 09/14/2015 Elsevier Interactive Patient Education  2018 [employer]  

## 2018-02-06 ENCOUNTER — Ambulatory Visit
Admission: RE | Admit: 2018-02-06 | Discharge: 2018-02-06 | Disposition: A | Discharge status: Home / Self Care | Payer: Medicare Other | Source: Ambulatory Visit | Attending: Radiation Oncology | Admitting: Radiation Oncology

## 2018-02-06 DIAGNOSIS — C7949 Secondary malignant neoplasm of other parts of nervous system: Principal | ICD-10-CM

## 2018-02-06 DIAGNOSIS — C7931 Secondary malignant neoplasm of brain: Secondary | ICD-10-CM

## 2018-02-06 MED ORDER — GADOBENATE DIMEGLUMINE 529 MG/ML IV SOLN
6.0000 mL | Freq: Once | INTRAVENOUS | Status: AC | PRN
  Administered 2018-02-06: 6 mL via INTRAVENOUS

## 2018-02-07 ENCOUNTER — Emergency Department (HOSPITAL_COMMUNITY): Payer: BLUE CROSS/BLUE SHIELD

## 2018-02-07 ENCOUNTER — Encounter (HOSPITAL_COMMUNITY): Payer: Self-pay

## 2018-02-07 ENCOUNTER — Emergency Department (HOSPITAL_COMMUNITY)
Admission: EM | Admit: 2018-02-07 | Discharge: 2018-02-07 | Disposition: A | Discharge status: Home / Self Care | Payer: BLUE CROSS/BLUE SHIELD | Attending: Emergency Medicine | Admitting: Emergency Medicine

## 2018-02-07 ENCOUNTER — Other Ambulatory Visit: Payer: Self-pay

## 2018-02-07 DIAGNOSIS — E876 Hypokalemia: Secondary | ICD-10-CM | POA: Insufficient documentation

## 2018-02-07 DIAGNOSIS — E119 Type 2 diabetes mellitus without complications: Secondary | ICD-10-CM | POA: Diagnosis not present

## 2018-02-07 DIAGNOSIS — K5900 Constipation, unspecified: Secondary | ICD-10-CM | POA: Diagnosis not present

## 2018-02-07 DIAGNOSIS — I5033 Acute on chronic diastolic (congestive) heart failure: Secondary | ICD-10-CM | POA: Diagnosis not present

## 2018-02-07 DIAGNOSIS — R112 Nausea with vomiting, unspecified: Secondary | ICD-10-CM

## 2018-02-07 DIAGNOSIS — D649 Anemia, unspecified: Secondary | ICD-10-CM | POA: Diagnosis not present

## 2018-02-07 DIAGNOSIS — Z794 Long term (current) use of insulin: Secondary | ICD-10-CM | POA: Diagnosis not present

## 2018-02-07 DIAGNOSIS — C799 Secondary malignant neoplasm of unspecified site: Secondary | ICD-10-CM

## 2018-02-07 DIAGNOSIS — J449 Chronic obstructive pulmonary disease, unspecified: Secondary | ICD-10-CM | POA: Insufficient documentation

## 2018-02-07 DIAGNOSIS — N189 Chronic kidney disease, unspecified: Secondary | ICD-10-CM | POA: Insufficient documentation

## 2018-02-07 DIAGNOSIS — C3490 Malignant neoplasm of unspecified part of unspecified bronchus or lung: Secondary | ICD-10-CM | POA: Diagnosis not present

## 2018-02-07 DIAGNOSIS — Z79899 Other long term (current) drug therapy: Secondary | ICD-10-CM | POA: Insufficient documentation

## 2018-02-07 DIAGNOSIS — I130 Hypertensive heart and chronic kidney disease with heart failure and stage 1 through stage 4 chronic kidney disease, or unspecified chronic kidney disease: Secondary | ICD-10-CM | POA: Diagnosis not present

## 2018-02-07 DIAGNOSIS — Z87891 Personal history of nicotine dependence: Secondary | ICD-10-CM | POA: Diagnosis not present

## 2018-02-07 LAB — CBC
HEMATOCRIT: 32.2 % — AB (ref 39.0–52.0)
Hemoglobin: 11.1 g/dL — ABNORMAL LOW (ref 13.0–17.0)
MCH: 34.6 pg — AB (ref 26.0–34.0)
MCHC: 34.5 g/dL (ref 30.0–36.0)
MCV: 100.3 fL — AB (ref 78.0–100.0)
PLATELETS: 131 10*3/uL — AB (ref 150–400)
RBC: 3.21 MIL/uL — ABNORMAL LOW (ref 4.22–5.81)
RDW: 16.1 % — ABNORMAL HIGH (ref 11.5–15.5)
WBC: 10.1 10*3/uL (ref 4.0–10.5)

## 2018-02-07 LAB — URINALYSIS, ROUTINE W REFLEX MICROSCOPIC
Bilirubin Urine: NEGATIVE
GLUCOSE, UA: NEGATIVE mg/dL
KETONES UR: NEGATIVE mg/dL
LEUKOCYTES UA: NEGATIVE
Nitrite: NEGATIVE
PH: 5 (ref 5.0–8.0)
PROTEIN: 30 mg/dL — AB
Specific Gravity, Urine: 1.011 (ref 1.005–1.030)

## 2018-02-07 LAB — COMPREHENSIVE METABOLIC PANEL
ALT: 16 U/L (ref 0–44)
AST: 18 U/L (ref 15–41)
Albumin: 4.2 g/dL (ref 3.5–5.0)
Alkaline Phosphatase: 63 U/L (ref 38–126)
Anion gap: 14 (ref 5–15)
BUN: 23 mg/dL (ref 8–23)
CHLORIDE: 94 mmol/L — AB (ref 98–111)
CO2: 28 mmol/L (ref 22–32)
CREATININE: 2.39 mg/dL — AB (ref 0.61–1.24)
Calcium: 9.8 mg/dL (ref 8.9–10.3)
GFR calc Af Amer: 31 mL/min — ABNORMAL LOW (ref >60)
GFR calc non Af Amer: 27 mL/min — ABNORMAL LOW (ref >60)
Glucose, Bld: 117 mg/dL — ABNORMAL HIGH (ref 70–99)
POTASSIUM: 3.2 mmol/L — AB (ref 3.5–5.1)
SODIUM: 136 mmol/L (ref 135–145)
Total Bilirubin: 0.7 mg/dL (ref 0.3–1.2)
Total Protein: 7.8 g/dL (ref 6.5–8.1)

## 2018-02-07 LAB — LIPASE, BLOOD: LIPASE: 25 U/L (ref 11–51)

## 2018-02-07 MED ORDER — POTASSIUM CHLORIDE CRYS ER 20 MEQ PO TBCR
40.0000 meq | EXTENDED_RELEASE_TABLET | Freq: Once | ORAL | Status: AC
  Administered 2018-02-07: 40 meq via ORAL
  Filled 2018-02-07: qty 2

## 2018-02-07 MED ORDER — ONDANSETRON HCL 4 MG/2ML IJ SOLN
4.0000 mg | Freq: Once | INTRAMUSCULAR | Status: AC
  Administered 2018-02-07: 4 mg via INTRAVENOUS
  Filled 2018-02-07: qty 2

## 2018-02-07 MED ORDER — MILK AND MOLASSES ENEMA
1.0000 | Freq: Once | RECTAL | Status: AC
  Administered 2018-02-07: 250 mL via RECTAL
  Filled 2018-02-07: qty 250

## 2018-02-07 MED ORDER — SODIUM CHLORIDE 0.9 % IV BOLUS
1000.0000 mL | Freq: Once | INTRAVENOUS | Status: AC
  Administered 2018-02-07: 1000 mL via INTRAVENOUS

## 2018-02-07 MED ORDER — HEPARIN SOD (PORK) LOCK FLUSH 100 UNIT/ML IV SOLN
500.0000 [IU] | Freq: Once | INTRAVENOUS | Status: AC
  Administered 2018-02-07: 500 [IU]
  Filled 2018-02-07: qty 5

## 2018-02-07 NOTE — ED Triage Notes (Signed)
Pt states that he has been having emesis approximately every time he tries to eat for the past three days.  Pt's family states that he has been constipated and hasn't had a "good BM" in approximately 1 month. Pt currently has abdominal cancer and brain cancer. Pt due to start radiation next week.

## 2018-02-07 NOTE — Discharge Instructions (Addendum)
Return here as needed. Follow up with your doctor. °

## 2018-02-07 NOTE — ED Provider Notes (Signed)
[facility] DEPT Provider Note   CSN: [id number] Arrival date & time: 02/07/18  1127     History   Chief Complaint Chief Complaint  Patient presents with  . Emesis    HPI Jesse Avila is a 67 y.o. male.  HPI Jesse Avila is a 67 y.o. male with history of A. 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  Patient is also complaining of constipation.  Patient is followed pretty closely by oncology.  Recently had imaging of the head, chest and abdomen which showed no new metastatic disease.  Patient states he is taking nausea medicines at home but they are not helping.  He is still able to keep some fluids and some food down.  He states he continues to lose weight.  He denies any fever or chills.  He denies any neurological complaints.  He denies any urinary symptoms.  States he has not been able to have a bowel movement and few weeks.  He has tried multiple different laxatives with no relief.  He states he did have a very small "pebble" today.  Past Medical History:  Diagnosis Date  . Atrial fibrillation ([place])   . Carotid artery occlusion   . DJD (degenerative joint disease) of knee   . Essential hypertension, benign    takes Benicar daily  . Goals of care, counseling/discussion 09/18/2016  . History of colon polyps    benign  . History of gout   . History of radiation therapy 09/30/16-11/10/16   left lung 60 Gy in 30 fractions  . Hyperlipidemia    takes Fenofibrate and Crestor daily  . Insomnia    takes Ambien nightly as needed  . Joint pain   . Lung cancer ([place])   . Nocturia   . Peripheral vascular disease ([place])   . Primary localized osteoarthritis of left knee 12/26/2014  . Stroke [employer]) 2015?   takes Plavix daily as well as  Pletal,not on plavix at present 05/15/15  . 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 Brain metastasis ([place]) 01/27/2018  . Encounter for antineoplastic immunotherapy 12/16/2017  . Hypotension 11/10/2017  . Hypokalemia 11/10/2017  . Thrombocytopenia ([place]) 11/10/2017  . Chemotherapy induced neutropenia ([place]) 11/10/2017  . Symptomatic anemia 11/09/2017  . Dehydration 10/19/2017  . Pancreatic mass   . Thrombus of left atrial appendage following myocardial infarction ([place])   . Acute kidney injury superimposed on chronic kidney disease ([place]) 03/05/2017  . HCAP (healthcare-associated pneumonia)   . Transaminitis   . Pleural effusion on left   . Postobstructive pneumonia 03/01/2017  . Lactic acidosis 03/01/2017  . Acute on chronic diastolic CHF (congestive heart failure) ([place]) 02/02/2017  . Port catheter in place 11/05/2016  . Malignant neoplasm of lung ([place])   . Anemia   . Atrial fibrillation ([place]) 10/21/2016  . Small cell lung carcinoma, left ([place]) 09/18/2016  . Goals of care, counseling/discussion 09/18/2016  . Encounter for antineoplastic chemotherapy 09/18/2016  . Hilar lymphadenopathy   . COPD (chronic obstructive pulmonary disease) ([place]) 09/03/2016  . PAD (peripheral artery disease) ([place]) 05/17/2015  . Primary localized osteoarthritis of left knee 12/26/2014  . Knee osteoarthritis  12/26/2014  . Carotid stenosis 04/21/2014  . History of stroke 04/21/2014  . Carotid stenosis with cerebral infarction less than 8 weeks ago 04/16/2014  . Diabetes mellitus, type 2 ([place]) 04/14/2014  . Obesity 04/14/2014  . Insomnia 10/18/2013  . Preop cardiovascular exam 12/12/2011  . DJD (degenerative joint disease) of knee   . ED (erectile dysfunction)   . GERD (gastroesophageal reflux disease)   . Essential hypertension, benign     . Peripheral vascular disease ([place])   . Hyperlipidemia   . Critical lower limb ischemia 11/19/2011    Past Surgical History:  Procedure Laterality Date  . ABDOMINAL AORTAGRAM N/A 03/29/2012   Procedure: ABDOMINAL Maxcine Ham;  Surgeon: Angelia Mould, MD;  Location: [facility] CATH LAB;  Service: Cardiovascular;  Laterality: N/A;  . CARDIOVERSION N/A 05/08/2017   Procedure: CARDIOVERSION;  Surgeon: Minna Merritts, MD;  Location: [facility] ORS;  Service: Cardiovascular;  Laterality: N/A;  . COLONOSCOPY    . ENDARTERECTOMY Right 05/09/2014   Procedure: RIGHT CAROTID ENDARTERECTOMY WITH PATCH ANGIOPLASTY;  Surgeon: Conrad Willow River, MD;  Location: [facility];  Service: Vascular;  Laterality: Right;  . ENDARTERECTOMY FEMORAL Right 05/17/2015   Procedure: ENDARTERECTOMY FEMORAL;  Surgeon: Angelia Mould, MD;  Location: [facility];  Service: Vascular;  Laterality: Right;  . ENDOBRONCHIAL ULTRASOUND Bilateral 09/08/2016   Procedure: ENDOBRONCHIAL ULTRASOUND;  Surgeon: Rigoberto Noel, MD;  Location: [facility] ENDOSCOPY;  Service: Cardiopulmonary;  Laterality: Bilateral;  . EUS N/A 10/08/2017   Procedure: UPPER ENDOSCOPIC ULTRASOUND (EUS) LINEAR;  Surgeon: [name], MD;  Location: [facility] ENDOSCOPY;  Service: Endoscopy;  Laterality: N/A;  . FEMORAL-POPLITEAL BYPASS GRAFT Right 05/17/2015   Procedure: BYPASS GRAFT FEMORAL-BELOW KNEE POPLITEAL ARTERY, using gortex propaten graft 6 mm x 80 cm;  Surgeon: Angelia Mould, MD;  Location: [facility];  Service: Vascular;  Laterality: Right;  . IR GENERIC HISTORICAL  09/25/2016   IR FLUORO GUIDE PORT INSERTION RIGHT 09/25/2016 Markus Daft, MD [facility]-INTERV RAD  . IR GENERIC HISTORICAL  09/25/2016   IR US GUIDE VASC ACCESS RIGHT 09/25/2016 Markus Daft, MD [facility]  . KNEE ARTHROSCOPY Right 11/2009  . LOWER EXTREMITY ANGIOGRAM Bilateral 03/23/2015   Procedure: Lower Extremity Angiogram;  Surgeon: Angelia Mould, MD;  Location: [facility] CV LAB;  Service: Cardiovascular;   Laterality: Bilateral;  . MENISCUS REPAIR  11/2009  . PARTIAL KNEE ARTHROPLASTY Left 12/26/2014   Procedure: UNICOMPARTMENTAL KNEE;  Surgeon: Marchia Bond, MD;  Location: [facility];  Service: Orthopedics;  Laterality: Left;  . PATCH ANGIOPLASTY Right 05/17/2015   Procedure: VEIN PATCH ANGIOPLASTY ILEOFEMORAL ARTERY;  Surgeon: Angelia Mould, MD;  Location: [facility];  Service: Vascular;  Laterality: Right;  . PERCUTANEOUS STENT INTERVENTION N/A 05/10/2012   Procedure: PERCUTANEOUS STENT INTERVENTION;  Surgeon: Angelia Mould, MD;  Location: [facility] CATH LAB;  Service: Cardiovascular;  Laterality: N/A;  . PERIPHERAL VASCULAR CATHETERIZATION N/A 03/23/2015   Procedure: Abdominal Aortogram;  Surgeon: Angelia Mould, MD;  Location: [facility] CV LAB;  Service: Cardiovascular;  Laterality: N/A;  . STENTS     PLACED IN ??BOTH LEGS   2013?  . TEE WITHOUT CARDIOVERSION N/A 03/06/2017   Procedure: TRANSESOPHAGEAL ECHOCARDIOGRAM (TEE);  Surgeon: Larey Dresser, MD;  Location: [facility] ENDOSCOPY;  Service: Cardiovascular;  Laterality: N/A;  . TEE WITHOUT CARDIOVERSION N/A 05/08/2017   Procedure: TRANSESOPHAGEAL ECHOCARDIOGRAM (TEE);  Surgeon: Minna Merritts, MD;  Location: [facility] ORS;  Service: Cardiovascular;  Laterality: N/A;        Home  Medications    Prior to Admission medications   Medication Sig Start Date End Date Taking? Authorizing Provider  amiodarone (PACERONE) 200 MG tablet TAKE 1 TABLET BY MOUTH EVERY DAY 02/01/18   Minna Merritts, MD  dronabinol (MARINOL) 2.5 MG capsule Take 1 capsule (2.5 mg total) by mouth 2 (two) times daily before a meal. 11/18/17   Curcio, Roselie Awkward, NP  furosemide (LASIX) 40 MG tablet TAKE 1 TABLET BY MOUTH TWICE A DAY Patient taking differently: TAKE 40 MG BY MOUTH TWICE A DAY 09/09/17   Minna Merritts, MD  HYDROcodone-acetaminophen (NORCO) 5-325 MG tablet Take 1 tablet by mouth every 6 (six) hours as needed for moderate pain. 01/27/18   Maryanna Shape, NP    insulin glargine (LANTUS) 100 UNIT/ML injection Inject 0.1 mLs (10 Units total) into the skin daily. Patient taking differently: Inject 15 Units into the skin at bedtime.  03/11/17   Regalado, Belkys A, MD  lactulose (CHRONULAC) 10 GM/15ML solution Take 30 mLs (20 g total) by mouth daily as needed for mild constipation or moderate constipation. 12/16/17   Maryanna Shape, NP  lidocaine (XYLOCAINE) 2 % solution Use as directed 15 mLs in the mouth or throat 3 (three) times daily before meals. 11/13/17   Hosie Poisson, MD  lidocaine-prilocaine (EMLA) cream Apply 1 application topically as needed. 11/25/17   Maryanna Shape, NP  pantoprazole (PROTONIX) 40 MG tablet Take 1 tablet (40 mg total) by mouth 2 (two) times daily for 15 days. Patient not taking: Reported on 01/28/2018 11/13/17 11/28/17  Hosie Poisson, MD  potassium chloride SA (K-DUR,KLOR-CON) 20 MEQ tablet Take 1 tablet (20 mEq total) by mouth 2 (two) times daily. 01/27/18   Maryanna Shape, NP  prochlorperazine (COMPAZINE) 10 MG tablet Take 1 tablet (10 mg total) by mouth every 6 (six) hours as needed for nausea or vomiting. 01/14/18   Curt Bears, MD  spironolactone (ALDACTONE) 25 MG tablet Take 1 tablet (25 mg total) by mouth daily. 03/11/17   Regalado, Belkys A, MD  sucralfate (CARAFATE) 1 GM/10ML suspension Take 10 mLs (1 g total) by mouth 4 (four) times daily - after meals and at bedtime. Patient not taking: Reported on 01/28/2018 11/13/17   Hosie Poisson, MD  zolpidem (AMBIEN) 10 MG tablet Take 10 mg by mouth at bedtime.  01/26/15   [provider]    Family History Family History  Problem Relation Age of Onset  . Diabetes Mother   . Hypertension Mother   . Heart disease Mother        Coronary Artery Bypass Graft  . Hyperlipidemia Mother   . Heart attack Mother   . Hypertension Father   . Hyperlipidemia Sister   . Stroke Sister     Social History Social History   Tobacco Use  . Smoking status: Former Smoker     Packs/day: 1.00    Years: 20.00    Pack years: 20.00    Types: Cigarettes  . Smokeless tobacco: Never Used  . Tobacco comment: quit smoking 2008  Substance Use Topics  . Alcohol use: Yes    Alcohol/week: 0.0 oz    Comment: beer- occasionally  . Drug use: No     Allergies   Cialis [tadalafil]   Review of Systems Review of Systems  Constitutional: Positive for activity change, appetite change, fatigue and unexpected weight change. Negative for chills and fever.  Respiratory: Negative for cough, chest tightness and shortness of breath.   Cardiovascular: Negative  for chest pain, palpitations and leg swelling.  Gastrointestinal: Positive for nausea and vomiting. Negative for abdominal distention, abdominal pain and diarrhea.  Genitourinary: Negative for dysuria, frequency, hematuria and urgency.  Musculoskeletal: Negative for arthralgias, myalgias, neck pain and neck stiffness.  Skin: Negative for rash.  Allergic/Immunologic: Negative for immunocompromised state.  Neurological: Positive for weakness. Negative for dizziness, light-headedness, numbness and headaches.  All other systems reviewed and are negative.    Physical Exam Updated Vital Signs BP 111/76   Pulse 95   Temp (!) 97.5 F (36.4 C) (Oral)   Resp 14   Ht 6\' 1"  (1.854 m)   Wt 68.9 kg (152 lb)   SpO2 97%   BMI 20.05 kg/m   Physical Exam  Constitutional: He is oriented to person, place, and time. He appears well-developed and well-nourished. No distress.  HENT:  Head: Normocephalic and atraumatic.  Eyes: Conjunctivae are normal.  Neck: Neck supple.  Cardiovascular: Normal rate, regular rhythm and normal heart sounds.  Pulmonary/Chest: Effort normal. No respiratory distress. He has no wheezes. He has no rales.  Abdominal: Soft. Bowel sounds are normal. He exhibits no distension. There is tenderness. There is no rebound.  Diffuse tenderness  Musculoskeletal: He exhibits no edema.  Neurological: He is alert  and oriented to person, place, and time.  Skin: Skin is warm and dry.  Nursing note and vitals reviewed.    ED Treatments / Results  Labs (all labs ordered are listed, but only abnormal results are displayed) Labs Reviewed  COMPREHENSIVE METABOLIC PANEL - Abnormal; Notable for the following components:      Result Value   Potassium 3.2 (*)    Chloride 94 (*)    Glucose, Bld 117 (*)    Creatinine, Ser 2.39 (*)    GFR calc non Af Amer 27 (*)    GFR calc Af Amer 31 (*)    All other components within normal limits  CBC - Abnormal; Notable for the following components:   RBC 3.21 (*)    Hemoglobin 11.1 (*)    HCT 32.2 (*)    MCV 100.3 (*)    MCH 34.6 (*)    RDW 16.1 (*)    Platelets 131 (*)    All other components within normal limits  LIPASE, BLOOD  URINALYSIS, ROUTINE W REFLEX MICROSCOPIC    EKG None  Radiology Mr Jeri Cos Wo Contrast  Result Date: 02/06/2018 CLINICAL DATA:  Stage IV lung cancer, small cell. Previous PCI in 2018. Patient developed symptoms of dizziness and falling, with repeat noncontrast MRI suspicious for cerebral metastatic disease. Preprocedure evaluation for SRS. EXAM: MRI HEAD WITHOUT AND WITH CONTRAST TECHNIQUE: Multiplanar, multiecho pulse sequences of the brain and surrounding structures were obtained without and with intravenous contrast. CONTRAST:  81mL MULTIHANCE GADOBENATE DIMEGLUMINE 529 MG/ML IV SOLN COMPARISON:  Noncontrast brain MR 01/15/2018. FINDINGS: Brain: Solitary enhancing lesion, RIGHT frontal periventricular white matter, central necrosis, measures 15 x 14 x 18 mm. Interval growth compared with noncontrast CT from June 14. Mild surrounding vasogenic edema. No blood products or susceptibility. Generalized atrophy, premature for age. Multiple chronic infarcts as previously noted. T2 and FLAIR hyperintensities in the white matter, likely small vessel disease. Vascular: Flow voids are maintained. Skull and upper cervical spine: No osseous findings  of significance. Sinuses/Orbits: No layering sinus fluid.  Negative orbits. Other: Trace LEFT mastoid fluid. IMPRESSION: Interval growth of a solitary, centrally necrotic RIGHT frontal periventricular metastasis, now 15 x 14 x 18 mm. See discussion above.  Electronically Signed   By: Staci Righter M.D.   On: 02/06/2018 13:20    Procedures Procedures (including critical care time)  Medications Ordered in ED Medications  sodium chloride 0.9 % bolus 1,000 mL (1,000 mLs Intravenous New Bag/Given 02/07/18 1513)  milk and molasses enema (has no administration in time range)  potassium chloride SA (K-DUR,KLOR-CON) CR tablet 40 mEq (has no administration in time range)  ondansetron (ZOFRAN) injection 4 mg (4 mg Intravenous Given 02/07/18 1513)     Initial Impression / Assessment and Plan / ED Course  I have reviewed the triage vital signs and the nursing notes.  Pertinent labs & imaging results that were available during my care of the patient were reviewed by me and considered in my medical decision making (see chart for details).     Patient with metastatic disease, here with weakness, nausea, dizziness.  Symptoms seem to be chronic.  We will give him some IV fluids here and Zofran. Dr. Gilford Raid has also noticed pt is still on lasix, will cut it in half.  Will get x-ray of the abdomen to evaluate bowel pattern and assess for possible constipation.   X-ray shows moderate amount of stool.  I will order an enema.  Blood work shows slightly decreased potassium at 3.2, chloride of 94, elevated creatinine of 2.39 which is close to his baseline.  Hemoglobin slightly low 11.1, and baseline.  Plan is to hydrate, enema, p.o. trial, follow-up as an outpatient.  Pt signed out to PA Lawyer pending Fluid bolus, Enema, Urine analysis, PO trial  Vitals:   02/07/18 1136 02/07/18 1137 02/07/18 1410 02/07/18 1500  BP: 111/76  117/80 126/82  Pulse: 95  91 87  Resp: 14  16 19   Temp: (!) 97.5 F (36.4 C)       TempSrc: Oral     SpO2: 97%  100% 97%  Weight:  68.9 kg (152 lb)    Height:  6\' 1"  (1.854 m)       Final Clinical Impressions(s) / ED Diagnoses   Final diagnoses:  Constipation  Nausea and vomiting, intractability of vomiting not specified, unspecified vomiting type  Metastatic cancer ([place])  Hypokalemia  Anemia, unspecified type    ED Discharge Orders    None       Jeannett Senior, PA-C 02/07/18 1553    Isla Pence, MD 02/08/18 [phone]

## 2018-02-08 ENCOUNTER — Other Ambulatory Visit: Payer: Self-pay | Admitting: Radiation Oncology

## 2018-02-08 ENCOUNTER — Telehealth: Payer: Self-pay | Admitting: Radiation Therapy

## 2018-02-08 MED ORDER — DEXAMETHASONE 2 MG PO TABS: 2.0000 mg | ORAL_TABLET | Freq: Two times a day (BID) | ORAL | 0 refills | Status: DC

## 2018-02-08 NOTE — Progress Notes (Signed)
Pt was seen in ED with n/v constipation. He has a solitary brain met which is slightly larger on 3T MRI. He has not been on steroids however given his progressive symptoms we will start at a lower dose with dexamethasone, 2 mg BID. We will taper once he's had SRS.     Carola Rhine, PAC

## 2018-02-08 NOTE — Telephone Encounter (Signed)
I left a message on Mr. Bower's cell and then spoke with his wife, to let them know that Bryson Ha has called in a prescription for 2mg  steroid to be taken BID, starting today. She was happy for the call and plans to pick the medication up this morning for Mr. Pelissier to start right away.   Mont Dutton R.T.(R)(T) Special Procedures Navigator

## 2018-02-09 ENCOUNTER — Ambulatory Visit
Admission: RE | Admit: 2018-02-09 | Discharge: 2018-02-09 | Disposition: A | Discharge status: Home / Self Care | Payer: BLUE CROSS/BLUE SHIELD | Source: Ambulatory Visit | Attending: Radiation Oncology | Admitting: Radiation Oncology

## 2018-02-09 DIAGNOSIS — Z510 Encounter for antineoplastic radiation therapy: Secondary | ICD-10-CM | POA: Diagnosis not present

## 2018-02-09 DIAGNOSIS — C7931 Secondary malignant neoplasm of brain: Secondary | ICD-10-CM

## 2018-02-10 ENCOUNTER — Other Ambulatory Visit: Payer: Self-pay | Admitting: Internal Medicine

## 2018-02-11 ENCOUNTER — Telehealth: Payer: Self-pay | Admitting: Medical Oncology

## 2018-02-11 DIAGNOSIS — Z510 Encounter for antineoplastic radiation therapy: Secondary | ICD-10-CM | POA: Diagnosis not present

## 2018-02-11 DIAGNOSIS — R112 Nausea with vomiting, unspecified: Secondary | ICD-10-CM

## 2018-02-11 MED ORDER — ONDANSETRON HCL 8 MG PO TABS: 8.0000 mg | ORAL_TABLET | Freq: Three times a day (TID) | ORAL | 0 refills | Status: DC | PRN

## 2018-02-11 NOTE — Telephone Encounter (Signed)
err

## 2018-02-11 NOTE — Telephone Encounter (Addendum)
Nausea/abd pain. Compazine not effective , took his daughter's zofran and it helped. LBM was Sunday in ED after enema. He was instructed to take lactulose as prescribed . Abd pain- hydrocodone not effective. I told pt and wife that he sees radiation tomorrow and that Dr Julien Nordmann asked XRT to address the abdominal area seen on CT scan that may be associated with his  abdominal pain. I sent in zofran rx .

## 2018-02-12 ENCOUNTER — Encounter: Payer: Self-pay | Admitting: Radiation Oncology

## 2018-02-12 ENCOUNTER — Ambulatory Visit
Admission: RE | Admit: 2018-02-12 | Discharge: 2018-02-12 | Disposition: A | Discharge status: Home / Self Care | Payer: BLUE CROSS/BLUE SHIELD | Source: Ambulatory Visit | Attending: Radiation Oncology | Admitting: Radiation Oncology

## 2018-02-12 ENCOUNTER — Other Ambulatory Visit: Payer: Self-pay | Admitting: Urology

## 2018-02-12 DIAGNOSIS — C772 Secondary and unspecified malignant neoplasm of intra-abdominal lymph nodes: Secondary | ICD-10-CM

## 2018-02-12 DIAGNOSIS — Z510 Encounter for antineoplastic radiation therapy: Secondary | ICD-10-CM | POA: Diagnosis not present

## 2018-02-12 DIAGNOSIS — C3492 Malignant neoplasm of unspecified part of left bronchus or lung: Secondary | ICD-10-CM

## 2018-02-12 DIAGNOSIS — C7931 Secondary malignant neoplasm of brain: Secondary | ICD-10-CM

## 2018-02-12 NOTE — Procedures (Signed)
  Name: Jesse Avila  MRN: [id number]  Date: 02/12/2018   DOB: 1950-09-17  Stereotactic Radiosurgery Operative Note  PRE-OPERATIVE DIAGNOSIS:  Solitary Brain Metastasis  POST-OPERATIVE DIAGNOSIS:  Solitary Brain Metastasis  PROCEDURE:  Stereotactic Radiosurgery  SURGEON:  Markiesha Delia P, MD  NARRATIVE: The patient underwent a radiation treatment planning session in the radiation oncology simulation suite under the care of the radiation oncology physician and physicist.  I participated closely in the radiation treatment planning afterwards. The patient underwent planning CT which was fused to 3T high resolution MRI with 1 mm axial slices.  These images were fused on the planning system.  We contoured the gross target volumes and subsequently expanded this to yield the Planning Target Volume. I actively participated in the planning process.  I helped to define and review the target contours and also the contours of the optic pathway, eyes, brainstem and selected nearby organs at risk.  All the dose constraints for critical structures were reviewed and compared to AAPM Task Group 101.  The prescription dose conformity was reviewed.  I approved the plan electronically.    Accordingly, Jesse Avila was brought to the TrueBeam stereotactic radiation treatment linac and placed in the custom immobilization mask.  The patient was aligned according to the IR fiducial markers with BrainLab Exactrac, then orthogonal x-rays were used in ExacTrac with the 6DOF robotic table and the shifts were made to align the patient  Jesse Avila received stereotactic radiosurgery uneventfully.    The detailed description of the procedure is recorded in the radiation oncology procedure note.  I was present for the duration of the procedure.  DISPOSITION:  Following delivery, the patient was transported to nursing in stable condition and monitored for possible acute effects to be discharged to home in stable condition with  follow-up in one month.  Ivey Cina P, MD 02/12/2018 12:44 PM

## 2018-02-12 NOTE — Progress Notes (Signed)
Patient had his [place] treatment today, followed by Simulation.  He was monitored in [place] by the therapist.  Will continue to follow as necessary.  Gloriajean Dell. Leonie Green, BSN

## 2018-02-15 ENCOUNTER — Inpatient Hospital Stay (HOSPITAL_COMMUNITY)
Admission: EM | Admit: 2018-02-15 | Discharge: 2018-02-18 | DRG: 640 | Disposition: A | Discharge status: Home Health | Payer: BLUE CROSS/BLUE SHIELD | Attending: Internal Medicine | Admitting: Internal Medicine

## 2018-02-15 ENCOUNTER — Encounter (HOSPITAL_COMMUNITY): Payer: Self-pay | Admitting: Emergency Medicine

## 2018-02-15 ENCOUNTER — Emergency Department (HOSPITAL_COMMUNITY): Payer: BLUE CROSS/BLUE SHIELD

## 2018-02-15 ENCOUNTER — Telehealth: Payer: Self-pay | Admitting: Medical Oncology

## 2018-02-15 DIAGNOSIS — Z794 Long term (current) use of insulin: Secondary | ICD-10-CM

## 2018-02-15 DIAGNOSIS — T502X5A Adverse effect of carbonic-anhydrase inhibitors, benzothiadiazides and other diuretics, initial encounter: Secondary | ICD-10-CM | POA: Diagnosis present

## 2018-02-15 DIAGNOSIS — M109 Gout, unspecified: Secondary | ICD-10-CM | POA: Diagnosis present

## 2018-02-15 DIAGNOSIS — E1151 Type 2 diabetes mellitus with diabetic peripheral angiopathy without gangrene: Secondary | ICD-10-CM | POA: Diagnosis present

## 2018-02-15 DIAGNOSIS — E861 Hypovolemia: Principal | ICD-10-CM | POA: Diagnosis present

## 2018-02-15 DIAGNOSIS — E860 Dehydration: Secondary | ICD-10-CM | POA: Diagnosis not present

## 2018-02-15 DIAGNOSIS — E871 Hypo-osmolality and hyponatremia: Secondary | ICD-10-CM

## 2018-02-15 DIAGNOSIS — R531 Weakness: Secondary | ICD-10-CM

## 2018-02-15 DIAGNOSIS — D6481 Anemia due to antineoplastic chemotherapy: Secondary | ICD-10-CM

## 2018-02-15 DIAGNOSIS — Z923 Personal history of irradiation: Secondary | ICD-10-CM

## 2018-02-15 DIAGNOSIS — C7931 Secondary malignant neoplasm of brain: Secondary | ICD-10-CM | POA: Diagnosis present

## 2018-02-15 DIAGNOSIS — N179 Acute kidney failure, unspecified: Secondary | ICD-10-CM

## 2018-02-15 DIAGNOSIS — R627 Adult failure to thrive: Secondary | ICD-10-CM

## 2018-02-15 DIAGNOSIS — M1712 Unilateral primary osteoarthritis, left knee: Secondary | ICD-10-CM | POA: Diagnosis present

## 2018-02-15 DIAGNOSIS — K219 Gastro-esophageal reflux disease without esophagitis: Secondary | ICD-10-CM | POA: Diagnosis present

## 2018-02-15 DIAGNOSIS — E876 Hypokalemia: Secondary | ICD-10-CM

## 2018-02-15 DIAGNOSIS — Z8589 Personal history of malignant neoplasm of other organs and systems: Secondary | ICD-10-CM

## 2018-02-15 DIAGNOSIS — C3492 Malignant neoplasm of unspecified part of left bronchus or lung: Secondary | ICD-10-CM | POA: Diagnosis present

## 2018-02-15 DIAGNOSIS — I951 Orthostatic hypotension: Secondary | ICD-10-CM | POA: Diagnosis present

## 2018-02-15 DIAGNOSIS — J449 Chronic obstructive pulmonary disease, unspecified: Secondary | ICD-10-CM | POA: Diagnosis present

## 2018-02-15 DIAGNOSIS — I739 Peripheral vascular disease, unspecified: Secondary | ICD-10-CM | POA: Diagnosis present

## 2018-02-15 DIAGNOSIS — Z85841 Personal history of malignant neoplasm of brain: Secondary | ICD-10-CM

## 2018-02-15 DIAGNOSIS — Z8249 Family history of ischemic heart disease and other diseases of the circulatory system: Secondary | ICD-10-CM

## 2018-02-15 DIAGNOSIS — Z87891 Personal history of nicotine dependence: Secondary | ICD-10-CM

## 2018-02-15 DIAGNOSIS — Z7901 Long term (current) use of anticoagulants: Secondary | ICD-10-CM

## 2018-02-15 DIAGNOSIS — R7989 Other specified abnormal findings of blood chemistry: Secondary | ICD-10-CM

## 2018-02-15 DIAGNOSIS — R296 Repeated falls: Secondary | ICD-10-CM | POA: Diagnosis present

## 2018-02-15 DIAGNOSIS — T451X5A Adverse effect of antineoplastic and immunosuppressive drugs, initial encounter: Secondary | ICD-10-CM | POA: Diagnosis present

## 2018-02-15 DIAGNOSIS — N184 Chronic kidney disease, stage 4 (severe): Secondary | ICD-10-CM | POA: Diagnosis present

## 2018-02-15 DIAGNOSIS — E43 Unspecified severe protein-calorie malnutrition: Secondary | ICD-10-CM

## 2018-02-15 DIAGNOSIS — I4891 Unspecified atrial fibrillation: Secondary | ICD-10-CM | POA: Diagnosis not present

## 2018-02-15 DIAGNOSIS — Z681 Body mass index (BMI) 19 or less, adult: Secondary | ICD-10-CM

## 2018-02-15 DIAGNOSIS — Z823 Family history of stroke: Secondary | ICD-10-CM

## 2018-02-15 DIAGNOSIS — E785 Hyperlipidemia, unspecified: Secondary | ICD-10-CM | POA: Diagnosis present

## 2018-02-15 DIAGNOSIS — Z86010 Personal history of colonic polyps: Secondary | ICD-10-CM

## 2018-02-15 DIAGNOSIS — E1122 Type 2 diabetes mellitus with diabetic chronic kidney disease: Secondary | ICD-10-CM | POA: Diagnosis present

## 2018-02-15 DIAGNOSIS — I482 Chronic atrial fibrillation: Secondary | ICD-10-CM | POA: Diagnosis present

## 2018-02-15 DIAGNOSIS — I10 Essential (primary) hypertension: Secondary | ICD-10-CM | POA: Diagnosis present

## 2018-02-15 DIAGNOSIS — K5900 Constipation, unspecified: Secondary | ICD-10-CM

## 2018-02-15 DIAGNOSIS — Z833 Family history of diabetes mellitus: Secondary | ICD-10-CM

## 2018-02-15 DIAGNOSIS — I129 Hypertensive chronic kidney disease with stage 1 through stage 4 chronic kidney disease, or unspecified chronic kidney disease: Secondary | ICD-10-CM | POA: Diagnosis present

## 2018-02-15 DIAGNOSIS — G4700 Insomnia, unspecified: Secondary | ICD-10-CM | POA: Diagnosis present

## 2018-02-15 DIAGNOSIS — Z85118 Personal history of other malignant neoplasm of bronchus and lung: Secondary | ICD-10-CM

## 2018-02-15 DIAGNOSIS — Z66 Do not resuscitate: Secondary | ICD-10-CM | POA: Diagnosis present

## 2018-02-15 DIAGNOSIS — D6959 Other secondary thrombocytopenia: Secondary | ICD-10-CM | POA: Diagnosis present

## 2018-02-15 DIAGNOSIS — I480 Paroxysmal atrial fibrillation: Secondary | ICD-10-CM | POA: Diagnosis present

## 2018-02-15 DIAGNOSIS — Z8349 Family history of other endocrine, nutritional and metabolic diseases: Secondary | ICD-10-CM

## 2018-02-15 DIAGNOSIS — Z888 Allergy status to other drugs, medicaments and biological substances status: Secondary | ICD-10-CM

## 2018-02-15 LAB — COMPREHENSIVE METABOLIC PANEL
ALK PHOS: 59 U/L (ref 38–126)
ALT: 26 U/L (ref 0–44)
AST: 17 U/L (ref 15–41)
Albumin: 3.9 g/dL (ref 3.5–5.0)
Anion gap: 12 (ref 5–15)
BUN: 43 mg/dL — AB (ref 8–23)
CALCIUM: 9.4 mg/dL (ref 8.9–10.3)
CHLORIDE: 94 mmol/L — AB (ref 98–111)
CO2: 26 mmol/L (ref 22–32)
CREATININE: 2.69 mg/dL — AB (ref 0.61–1.24)
GFR calc Af Amer: 27 mL/min — ABNORMAL LOW (ref >60)
GFR, EST NON AFRICAN AMERICAN: 23 mL/min — AB (ref >60)
Glucose, Bld: 141 mg/dL — ABNORMAL HIGH (ref 70–99)
Potassium: 3.1 mmol/L — ABNORMAL LOW (ref 3.5–5.1)
SODIUM: 132 mmol/L — AB (ref 135–145)
Total Bilirubin: 0.9 mg/dL (ref 0.3–1.2)
Total Protein: 6.8 g/dL (ref 6.5–8.1)

## 2018-02-15 LAB — LIPASE, BLOOD: LIPASE: 32 U/L (ref 11–51)

## 2018-02-15 LAB — CBC WITH DIFFERENTIAL/PLATELET
Basophils Absolute: 0 10*3/uL (ref 0.0–0.1)
Basophils Relative: 0 %
EOS ABS: 0 10*3/uL (ref 0.0–0.7)
EOS PCT: 0 %
HCT: 32.7 % — ABNORMAL LOW (ref 39.0–52.0)
Hemoglobin: 11.4 g/dL — ABNORMAL LOW (ref 13.0–17.0)
LYMPHS ABS: 1 10*3/uL (ref 0.7–4.0)
Lymphocytes Relative: 11 %
MCH: 34.7 pg — AB (ref 26.0–34.0)
MCHC: 34.9 g/dL (ref 30.0–36.0)
MCV: 99.4 fL (ref 78.0–100.0)
MONOS PCT: 8 %
Monocytes Absolute: 0.7 10*3/uL (ref 0.1–1.0)
Neutro Abs: 7.6 10*3/uL (ref 1.7–7.7)
Neutrophils Relative %: 81 %
PLATELETS: 78 10*3/uL — AB (ref 150–400)
RBC: 3.29 MIL/uL — ABNORMAL LOW (ref 4.22–5.81)
RDW: 15.3 % (ref 11.5–15.5)
WBC: 9.3 10*3/uL (ref 4.0–10.5)

## 2018-02-15 LAB — URINALYSIS, ROUTINE W REFLEX MICROSCOPIC
BILIRUBIN URINE: NEGATIVE
GLUCOSE, UA: NEGATIVE mg/dL
Ketones, ur: NEGATIVE mg/dL
Leukocytes, UA: NEGATIVE
Nitrite: NEGATIVE
Protein, ur: 30 mg/dL — AB
Specific Gravity, Urine: 1.014 (ref 1.005–1.030)
pH: 5 (ref 5.0–8.0)

## 2018-02-15 LAB — I-STAT CG4 LACTIC ACID, ED: Lactic Acid, Venous: 1.01 mmol/L (ref 0.5–1.9)

## 2018-02-15 LAB — MAGNESIUM: Magnesium: 2.6 mg/dL — ABNORMAL HIGH (ref 1.7–2.4)

## 2018-02-15 MED ORDER — APIXABAN 5 MG PO TABS
5.0000 mg | ORAL_TABLET | Freq: Two times a day (BID) | ORAL | Status: DC
  Administered 2018-02-16: 5 mg via ORAL
  Filled 2018-02-15: qty 1

## 2018-02-15 MED ORDER — SENNOSIDES-DOCUSATE SODIUM 8.6-50 MG PO TABS
1.0000 | ORAL_TABLET | Freq: Every day | ORAL | Status: DC
  Administered 2018-02-16 – 2018-02-17 (×3): 1 via ORAL
  Filled 2018-02-15 (×3): qty 1

## 2018-02-15 MED ORDER — SODIUM CHLORIDE 0.9 % IV SOLN
INTRAVENOUS | Status: DC
  Administered 2018-02-15: 23:00:00 via INTRAVENOUS

## 2018-02-15 MED ORDER — ZOLPIDEM TARTRATE 5 MG PO TABS
5.0000 mg | ORAL_TABLET | Freq: Every evening | ORAL | Status: DC | PRN
  Administered 2018-02-16 – 2018-02-17 (×3): 5 mg via ORAL
  Filled 2018-02-15 (×4): qty 1

## 2018-02-15 MED ORDER — HYDROMORPHONE HCL 1 MG/ML IJ SOLN
0.5000 mg | INTRAMUSCULAR | Status: DC | PRN
  Administered 2018-02-16 – 2018-02-18 (×10): 0.5 mg via INTRAVENOUS
  Filled 2018-02-15 (×10): qty 0.5

## 2018-02-15 MED ORDER — POTASSIUM CHLORIDE CRYS ER 20 MEQ PO TBCR
40.0000 meq | EXTENDED_RELEASE_TABLET | Freq: Once | ORAL | Status: AC
  Administered 2018-02-15: 40 meq via ORAL
  Filled 2018-02-15: qty 2

## 2018-02-15 MED ORDER — ACETAMINOPHEN 650 MG RE SUPP: 650.0000 mg | Freq: Four times a day (QID) | RECTAL | Status: DC | PRN

## 2018-02-15 MED ORDER — LACTULOSE 10 GM/15ML PO SOLN: 20.0000 g | Freq: Every day | ORAL | Status: DC | PRN

## 2018-02-15 MED ORDER — SODIUM CHLORIDE 0.9 % IV BOLUS
500.0000 mL | Freq: Once | INTRAVENOUS | Status: AC
  Administered 2018-02-15: 500 mL via INTRAVENOUS

## 2018-02-15 MED ORDER — HYDROMORPHONE HCL 1 MG/ML IJ SOLN
0.5000 mg | Freq: Once | INTRAMUSCULAR | Status: AC
  Administered 2018-02-15: 0.5 mg via INTRAVENOUS
  Filled 2018-02-15: qty 1

## 2018-02-15 MED ORDER — INSULIN ASPART 100 UNIT/ML ~~LOC~~ SOLN
0.0000 [IU] | Freq: Three times a day (TID) | SUBCUTANEOUS | Status: DC
  Administered 2018-02-16: 2 [IU] via SUBCUTANEOUS
  Administered 2018-02-16: 1 [IU] via SUBCUTANEOUS
  Administered 2018-02-16: 5 [IU] via SUBCUTANEOUS
  Administered 2018-02-17: 1 [IU] via SUBCUTANEOUS
  Administered 2018-02-17: 2 [IU] via SUBCUTANEOUS

## 2018-02-15 MED ORDER — ONDANSETRON HCL 4 MG/2ML IJ SOLN
4.0000 mg | Freq: Four times a day (QID) | INTRAMUSCULAR | Status: DC | PRN
  Administered 2018-02-16: 4 mg via INTRAVENOUS
  Filled 2018-02-15: qty 2

## 2018-02-15 MED ORDER — SODIUM CHLORIDE 0.9 % IV BOLUS
1000.0000 mL | Freq: Once | INTRAVENOUS | Status: AC
  Administered 2018-02-15: 1000 mL via INTRAVENOUS

## 2018-02-15 MED ORDER — DEXAMETHASONE 2 MG PO TABS
2.0000 mg | ORAL_TABLET | Freq: Two times a day (BID) | ORAL | Status: DC
  Administered 2018-02-16 – 2018-02-18 (×4): 2 mg via ORAL
  Filled 2018-02-15 (×6): qty 1

## 2018-02-15 MED ORDER — FLEET ENEMA 7-19 GM/118ML RE ENEM
1.0000 | ENEMA | Freq: Once | RECTAL | Status: DC
  Filled 2018-02-15: qty 1

## 2018-02-15 MED ORDER — ONDANSETRON HCL 4 MG PO TABS: 4.0000 mg | ORAL_TABLET | Freq: Four times a day (QID) | ORAL | Status: DC | PRN

## 2018-02-15 MED ORDER — ENSURE ENLIVE PO LIQD
237.0000 mL | Freq: Two times a day (BID) | ORAL | Status: DC
  Administered 2018-02-16: 237 mL via ORAL

## 2018-02-15 MED ORDER — LORAZEPAM 1 MG PO TABS
1.0000 mg | ORAL_TABLET | Freq: Two times a day (BID) | ORAL | Status: DC | PRN
  Administered 2018-02-16: 1 mg via ORAL
  Filled 2018-02-15: qty 1

## 2018-02-15 MED ORDER — PANTOPRAZOLE SODIUM 40 MG PO TBEC
40.0000 mg | DELAYED_RELEASE_TABLET | Freq: Two times a day (BID) | ORAL | Status: DC
  Administered 2018-02-16 – 2018-02-18 (×5): 40 mg via ORAL
  Filled 2018-02-15 (×6): qty 1

## 2018-02-15 MED ORDER — ACETAMINOPHEN 325 MG PO TABS: 650.0000 mg | ORAL_TABLET | Freq: Four times a day (QID) | ORAL | Status: DC | PRN

## 2018-02-15 MED ORDER — FENTANYL CITRATE (PF) 100 MCG/2ML IJ SOLN
25.0000 ug | Freq: Once | INTRAMUSCULAR | Status: AC
  Administered 2018-02-15: 25 ug via INTRAVENOUS
  Filled 2018-02-15: qty 2

## 2018-02-15 MED ORDER — SODIUM CHLORIDE 0.9% FLUSH
10.0000 mL | INTRAVENOUS | Status: DC | PRN
  Administered 2018-02-16 – 2018-02-18 (×2): 10 mL
  Filled 2018-02-15 (×2): qty 40

## 2018-02-15 NOTE — ED Notes (Signed)
ED TO INPATIENT HANDOFF REPORT  Name/Age/Gender Jesse Avila 67 y.o. male  Code Status Code Status History    Date Active Date Inactive Code Status Order ID Comments User Context   11/09/2017 2000 11/13/2017 1801 Full Code [id number]  Norval Morton, MD ED   03/01/2017 0459 03/10/2017 1844 Full Code [id number]  Etta Quill, DO ED   02/02/2017 1756 02/05/2017 2012 Full Code [id number]  Reubin Milan, MD Inpatient   10/21/2016 2204 10/22/2016 2127 Full Code [id number]  Rama, Venetia Maxon, MD Inpatient   05/17/2015 2210 05/20/2015 1649 Full Code [id number]  Gabriel Earing, PA-C Inpatient   03/23/2015 1434 03/23/2015 2235 Full Code [id number]  Angelia Mould, MD Inpatient   12/26/2014 1409 12/27/2014 1619 Full Code [id number]  Marchia Bond, MD Inpatient   05/09/2014 1424 05/10/2014 1322 Full Code [id number]  Orbie Hurst Inpatient   04/14/2014 1703 04/16/2014 1759 Full Code [id number]  Louellen Molder, MD Inpatient      Home/SNF/Other Home  Chief Complaint weakness cant eat  Level of Care/Admitting Diagnosis ED Disposition    ED Disposition Condition Comment   [facility] [[id number]]  Level of Care: Telemetry [5]  Admit to tele based on following criteria: Monitor for Ischemic changes  Diagnosis: Weakness [546568]  Admitting Physician: Rise Patience [phone]  Attending Physician: Rise Patience [phone]  PT Class (Do Not Modify): Observation [104]  PT Acc Code (Do Not Modify): Observation [10022]       Medical History Past Medical History:  Diagnosis Date  . Atrial fibrillation ([place])   . Carotid artery occlusion   . DJD (degenerative joint disease) of knee   . Essential hypertension, benign    takes Benicar daily  . Goals of care, counseling/discussion 09/18/2016  . History of colon polyps    benign  . History of gout   . History of radiation therapy 09/30/16-11/10/16   left lung 60 Gy in 30 fractions  .  Hyperlipidemia    takes Fenofibrate and Crestor daily  . Insomnia    takes Ambien nightly as needed  . Joint pain   . Lung cancer ([place])   . Nocturia   . Peripheral vascular disease ([place])   . Primary localized osteoarthritis of left knee 12/26/2014  . Stroke [facility]) 2015?   takes Plavix daily as well as Pletal,not on plavix at present 05/15/15  . 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 Cialis [Tadalafil] Other (See Comments)    Headache     IV Location/Drains/Wounds Patient Lines/Drains/Airways Status   Active Line/Drains/Airways    Name:   Placement date:   Placement time:   Site:   Days:   Implanted Port 09/25/16 Right Chest   09/25/16    1024    Chest   508          Labs/Imaging Results for orders placed or performed during the hospital encounter of 02/15/18 (from the past 48 hour(s))  Comprehensive metabolic panel     Status: Abnormal   Collection Time: 02/15/18  2:14 PM  Result Value Ref Range   Sodium 132 (L) 135 - 145 mmol/L   Potassium 3.1 (L) 3.5 - 5.1 mmol/L   Chloride 94 (L) 98 - 111 mmol/L    Comment: Please note change in reference range.  CO2 26 22 - 32 mmol/L   Glucose, Bld 141 (H) 70 - 99 mg/dL    Comment: Please note change in reference range.   BUN 43 (H) 8 - 23 mg/dL    Comment: Please note change in reference range.   Creatinine, Ser 2.69 (H) 0.61 - 1.24 mg/dL   Calcium 9.4 8.9 - 10.3 mg/dL   Total Protein 6.8 6.5 - 8.1 g/dL   Albumin 3.9 3.5 - 5.0 g/dL   AST 17 15 - 41 U/L   ALT 26 0 - 44 U/L    Comment: Please note change in reference range.   Alkaline Phosphatase 59 38 - 126 U/L   Total Bilirubin 0.9 0.3 - 1.2 mg/dL   GFR calc non Af Amer 23 (L) >60 mL/min   GFR calc Af Amer 27 (L) >60 mL/min    Comment: (NOTE) The eGFR has been calculated using the CKD EPI equation. This calculation  has not been validated in all clinical situations. eGFR's persistently <60 mL/min signify possible Chronic Kidney Disease.    Anion gap 12 5 - 15    Comment: Performed at [facility], [place] [address], [place], [place] [address]  CBC with Differential     Status: Abnormal   Collection Time: 02/15/18  2:14 PM  Result Value Ref Range   WBC 9.3 4.0 - 10.5 K/uL   RBC 3.29 (L) 4.22 - 5.81 MIL/uL   Hemoglobin 11.4 (L) 13.0 - 17.0 g/dL   HCT 32.7 (L) 39.0 - 52.0 %   MCV 99.4 78.0 - 100.0 fL   MCH 34.7 (H) 26.0 - 34.0 pg   MCHC 34.9 30.0 - 36.0 g/dL   RDW 15.3 11.5 - 15.5 %   Platelets 78 (L) 150 - 400 K/uL    Comment: REPEATED TO VERIFY SPECIMEN CHECKED FOR CLOTS PLATELET COUNT CONFIRMED BY SMEAR    Neutrophils Relative % 81 %   Neutro Abs 7.6 1.7 - 7.7 K/uL   Lymphocytes Relative 11 %   Lymphs Abs 1.0 0.7 - 4.0 K/uL   Monocytes Relative 8 %   Monocytes Absolute 0.7 0.1 - 1.0 K/uL   Eosinophils Relative 0 %   Eosinophils Absolute 0.0 0.0 - 0.7 K/uL   Basophils Relative 0 %   Basophils Absolute 0.0 0.0 - 0.1 K/uL    Comment: Performed at [facility], [place] [address], [place], [place] [address]  Lipase, blood     Status: None   Collection Time: 02/15/18  2:14 PM  Result Value Ref Range   Lipase 32 11 - 51 U/L    Comment: Performed at [facility], [place] [address], [place], [place] [address]  Magnesium     Status: Abnormal   Collection Time: 02/15/18  2:14 PM  Result Value Ref Range   Magnesium 2.6 (H) 1.7 - 2.4 mg/dL    Comment: Performed at [facility], [place] [address], [place], [place] [address]  I-Stat CG4 Lactic Acid, ED     Status: None   Collection Time: 02/15/18  2:27 PM  Result Value Ref Range   Lactic Acid, Venous 1.01 0.5 - 1.9 mmol/L  Urinalysis, Routine w reflex microscopic     Status: Abnormal   Collection Time: 02/15/18  5:18 PM  Result Value Ref Range   Color, Urine YELLOW YELLOW    APPearance CLOUDY (A) CLEAR   Specific Gravity, Urine 1.014 1.005 - 1.030   pH 5.0 5.0 - 8.0  Glucose, UA NEGATIVE NEGATIVE mg/dL   Hgb urine dipstick MODERATE (A) NEGATIVE   Bilirubin Urine NEGATIVE NEGATIVE   Ketones, ur NEGATIVE NEGATIVE mg/dL   Protein, ur 30 (A) NEGATIVE mg/dL   Nitrite NEGATIVE NEGATIVE   Leukocytes, UA NEGATIVE NEGATIVE   RBC / HPF 0-5 0 - 5 RBC/hpf   WBC, UA 0-5 0 - 5 WBC/hpf   Bacteria, UA RARE (A) NONE SEEN   Squamous Epithelial / LPF 0-5 0 - 5   Mucus PRESENT    Hyaline Casts, UA PRESENT    Granular Casts, UA PRESENT     Comment: Performed at [facility], [place] [address] [place], [place] [address]   Ct Head Wo Contrast  Result Date: 02/15/2018 CLINICAL DATA:  Minor head trauma, on anti coagulation, weakness, not able to either walk since Friday after radiation treatment to the brain, cervical spine trauma, high clinical risk, history lung cancer post radiation therapy, atrial fibrillation, carotid artery disease, hypertension, type II diabetes mellitus EXAM: CT HEAD WITHOUT CONTRAST CT CERVICAL SPINE WITHOUT CONTRAST TECHNIQUE: Multidetector CT imaging of the head and cervical spine was performed following the standard protocol without intravenous contrast. Multiplanar CT image reconstructions of the cervical spine were also generated. COMPARISON:  MR head 02/06/2018 FINDINGS: CT HEAD FINDINGS Brain: Generalized atrophy. Normal ventricular morphology. No midline shift or mass effect. Small vessel chronic ischemic changes of deep cerebral white matter. Deep RIGHT frontal periventricular mass 13 x 14 x 14 mm unchanged. 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 No intracranial hemorrhage, mass lesion, or evidence of acute infarction. No extra-axial fluid collections. Vascular: Atherosclerotic calcifications of internal carotid and vertebral arteries at skull base Skull: Intact Sinuses/Orbits: Visualized paranasal sinuses and mastoid air cells  clear Other: N/A CT CERVICAL SPINE FINDINGS Alignment: Normal Skull base and vertebrae: Mild osseous demineralization. Visualized skull base intact. Scattered facet degenerative changes bilaterally. Vertebral body heights maintained without fracture or bone destruction. Disc space narrowing C6-C7. Question ununited ossification center versus sequela of remote trauma at tip of spinous process of T1. Soft tissues and spinal canal: Prevertebral soft tissues normal thickness. Disc levels:  No additional abnormalities Upper chest: LEFT upper lobe lung scarring.  RIGHT apex clear. Other: Extensive atherosclerotic calcifications in the LEFT carotid system. IMPRESSION: RIGHT periventricular mass lesion likely metastasis in patient with history of lung cancer, grossly stable in size at 14 x 13 x 13 mm. Atrophy with small vessel chronic ischemic changes of deep cerebral white matter. No acute intracranial abnormalities. Degenerative disc and facet disease changes of the cervical spine. No acute cervical spine abnormalities. Carotid vascular disease. Electronically Signed   By: Lavonia Dana M.D.   On: 02/15/2018 15:37   Ct Cervical Spine Wo Contrast  Result Date: 02/15/2018 CLINICAL DATA:  Minor head trauma, on anti coagulation, weakness, not able to either walk since Friday after radiation treatment to the brain, cervical spine trauma, high clinical risk, history lung cancer post radiation therapy, atrial fibrillation, carotid artery disease, hypertension, type II diabetes mellitus EXAM: CT HEAD WITHOUT CONTRAST CT CERVICAL SPINE WITHOUT CONTRAST TECHNIQUE: Multidetector CT imaging of the head and cervical spine was performed following the standard protocol without intravenous contrast. Multiplanar CT image reconstructions of the cervical spine were also generated. COMPARISON:  MR head 02/06/2018 FINDINGS: CT HEAD FINDINGS Brain: Generalized atrophy. Normal ventricular morphology. No midline shift or mass effect. Small  vessel chronic ischemic changes of deep cerebral white matter. Deep RIGHT frontal periventricular mass 13 x 14 x  14 mm unchanged. 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 No intracranial hemorrhage, mass lesion, or evidence of acute infarction. No extra-axial fluid collections. Vascular: Atherosclerotic calcifications of internal carotid and vertebral arteries at skull base Skull: Intact Sinuses/Orbits: Visualized paranasal sinuses and mastoid air cells clear Other: N/A CT CERVICAL SPINE FINDINGS Alignment: Normal Skull base and vertebrae: Mild osseous demineralization. Visualized skull base intact. Scattered facet degenerative changes bilaterally. Vertebral body heights maintained without fracture or bone destruction. Disc space narrowing C6-C7. Question ununited ossification center versus sequela of remote trauma at tip of spinous process of T1. Soft tissues and spinal canal: Prevertebral soft tissues normal thickness. Disc levels:  No additional abnormalities Upper chest: LEFT upper lobe lung scarring.  RIGHT apex clear. Other: Extensive atherosclerotic calcifications in the LEFT carotid system. IMPRESSION: RIGHT periventricular mass lesion likely metastasis in patient with history of lung cancer, grossly stable in size at 14 x 13 x 13 mm. Atrophy with small vessel chronic ischemic changes of deep cerebral white matter. No acute intracranial abnormalities. Degenerative disc and facet disease changes of the cervical spine. No acute cervical spine abnormalities. Carotid vascular disease. Electronically Signed   By: Lavonia Dana M.D.   On: 02/15/2018 15:37   Dg Shoulder Left  Result Date: 02/15/2018 CLINICAL DATA:  67 year old male with weakness for the past 3 days. 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 No reported trauma. Initial encounter. EXAM: LEFT SHOULDER - 2+ VIEW COMPARISON:  01/25/2018 CT. FINDINGS: Moderate acromioclavicular joint degenerative  changes. No fracture or dislocation. No abnormal soft tissue calcifications. No osseous destructive lesion. Post treatment changes left upper lung. Carotid bifurcation calcifications. IMPRESSION: Moderate left acromioclavicular joint degenerative changes. Electronically Signed   By: Genia Del M.D.   On: 02/15/2018 15:42   Dg Abd 2 Views  Result Date: 02/15/2018 CLINICAL DATA:  Weakness with decreased oral intake over the last 3 days. EXAM: ABDOMEN - 2 VIEW COMPARISON:  Radiographs 02/07/2018.  CT 01/25/2018. FINDINGS: There is persistent retained contrast material in the colon from the prior CT. The bowel gas pattern is normal. There is no free intraperitoneal air or extraluminal contrast material. Bilateral iliac stents and diffuse aortoiliac atherosclerosis are noted. IMPRESSION: Retained contrast material in the colon consistent with constipation. No acute findings. Electronically Signed   By: Richardean Sale M.D.   On: 02/15/2018 15:42    Pending Labs Unresulted Labs (From admission, onward)   None      Vitals/Pain Today's Vitals   02/15/18 2100 02/15/18 2111 02/15/18 2130 02/15/18 2200  BP: 132/86 132/86 116/69   Pulse: 75 77  77  Resp: 17 (!) 24  16  Temp:      TempSrc:      SpO2: 96% 99%  99%  PainSc:        Isolation Precautions No active isolations  Medications Medications  sodium phosphate (FLEET) 7-19 GM/118ML enema 1 enema (1 enema Rectal Not Given 02/15/18 1900)  sodium chloride 0.9 % bolus 1,000 mL (0 mLs Intravenous Stopped 02/15/18 1757)  potassium chloride SA (K-DUR,KLOR-CON) CR tablet 40 mEq (40 mEq Oral Given 02/15/18 1621)  sodium chloride 0.9 % bolus 500 mL (0 mLs Intravenous Stopped 02/15/18 1619)  sodium chloride 0.9 % bolus 500 mL (0 mLs Intravenous Stopped 02/15/18 1932)  fentaNYL (SUBLIMAZE) injection 25 mcg (25 mcg Intravenous Given 02/15/18 1958)    Mobility walks

## 2018-02-15 NOTE — ED Notes (Signed)
Hospitalist at bedside. Pt will be transported after this.

## 2018-02-15 NOTE — ED Notes (Signed)
Report given to [place] on 4th floor. RN is currently with a patient, but pt can be transported in 15 min

## 2018-02-15 NOTE — Telephone Encounter (Signed)
VM-"weakness' - wife reports pt has been weak all weekend. I returned call and left message to call me back.

## 2018-02-15 NOTE — ED Provider Notes (Signed)
[facility] DEPT Provider Note   CSN: [id number] Arrival date & time: 02/15/18  1313     History   Chief Complaint Chief Complaint  Patient presents with  . Weakness    HPI Jesse Avila is a 67 y.o. male with a history of A. 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  Family is at bedside helps provide history.  Patient was seen here on 02/07/2018 for the same.  They report that he had some improvement after being seen but since that time has had radiation last Friday, 02/12/2018.  After this the patient has not eaten anything.  They wife reports that he had does take some very small sips of fluids periodically but has not take anything in the last 48 hours.  She reports he continues to lose weight and become more fatigued.  She reports that he has been walking with his walker but at times tries to stand without this and has fallen 5 times in the last 3 days.  She reports that yesterday he did hit his head on the left side as well as the left shoulder but did not lose consciousness.  She reports that the patient has had ongoing constipation.  He was seen here on 7/7 for the same.  She reports that despite home stool softeners & multiple laxatives he has not had a bowel movement since he was last here.  She reports that he has also going to the bathroom more often.  No gross hematuria, dysuria or urgency.  The patient reports that he does have some mid abdominal pain when he presses on it but this is chronic and unchanged.  He reports he has ongoing nausea despite nausea medication at home.  He denies any emesis.  No fever, chills, chest pain, shortness of breath, cough, flank pain, diarrhea. No neurological symptoms reported. Patient is alert and  orientated x 4.   Initial blood pressure low - patient blood pressure cuff wrong size. Readjusted with normal blood pressure reading following.   HPI  Past Medical History:  Diagnosis Date  . Atrial fibrillation ([place])   . Carotid artery occlusion   . DJD (degenerative joint disease) of knee   . Essential hypertension, benign    takes Benicar daily  . Goals of care, counseling/discussion 09/18/2016  . History of colon polyps    benign  . History of gout   . History of radiation therapy 09/30/16-11/10/16   left lung 60 Gy in 30 fractions  . Hyperlipidemia    takes Fenofibrate and Crestor daily  . Insomnia    takes Ambien nightly as needed  . Joint pain   . Lung cancer ([place])   . Nocturia   . Peripheral vascular disease ([place])   . Primary localized osteoarthritis of left knee 12/26/2014  . Stroke [facility]) 2015?   takes Plavix daily as well as Pletal,not on plavix at present 05/15/15  . 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 Brain metastasis ([place]) 01/27/2018  . Encounter for antineoplastic immunotherapy 12/16/2017  . Hypotension 11/10/2017  . Hypokalemia 11/10/2017  . Thrombocytopenia ([place]) 11/10/2017  . Chemotherapy induced  neutropenia ([place]) 11/10/2017  . Symptomatic anemia 11/09/2017  . Dehydration 10/19/2017  . Pancreatic mass   . Thrombus of left atrial appendage following myocardial infarction ([place])   . Acute kidney injury superimposed on chronic kidney disease ([place]) 03/05/2017  . HCAP (healthcare-associated pneumonia)   . Transaminitis   . Pleural effusion on left   . Postobstructive pneumonia 03/01/2017  . Lactic acidosis 03/01/2017  . Acute on chronic diastolic CHF (congestive heart failure) ([place]) 02/02/2017  . Port catheter in place 11/05/2016  . Malignant neoplasm of lung ([place])   . Anemia   .  Atrial fibrillation ([place]) 10/21/2016  . Small cell lung carcinoma, left ([place]) 09/18/2016  . Goals of care, counseling/discussion 09/18/2016  . Encounter for antineoplastic chemotherapy 09/18/2016  . Hilar lymphadenopathy   . COPD (chronic obstructive pulmonary disease) ([place]) 09/03/2016  . PAD (peripheral artery disease) ([place]) 05/17/2015  . Primary localized osteoarthritis of left knee 12/26/2014  . Knee osteoarthritis 12/26/2014  . Carotid stenosis 04/21/2014  . History of stroke 04/21/2014  . Carotid stenosis with cerebral infarction less than 8 weeks ago 04/16/2014  . Diabetes mellitus, type 2 ([place]) 04/14/2014  . Obesity 04/14/2014  . Insomnia 10/18/2013  . Preop cardiovascular exam 12/12/2011  . DJD (degenerative joint disease) of knee   . ED (erectile dysfunction)   . GERD (gastroesophageal reflux disease)   . Essential hypertension, benign   . Peripheral vascular disease ([place])   . Hyperlipidemia   . Critical lower limb ischemia 11/19/2011    Past Surgical History:  Procedure Laterality Date  . ABDOMINAL AORTAGRAM N/A 03/29/2012   Procedure: ABDOMINAL Maxcine Ham;  Surgeon: Angelia Mould, MD;  Location: [facility] CATH LAB;  Service: Cardiovascular;  Laterality: N/A;  . CARDIOVERSION N/A 05/08/2017   Procedure: CARDIOVERSION;  Surgeon: Minna Merritts, MD;  Location: [facility] ORS;  Service: Cardiovascular;  Laterality: N/A;  . COLONOSCOPY    . ENDARTERECTOMY Right 05/09/2014   Procedure: RIGHT CAROTID ENDARTERECTOMY WITH PATCH ANGIOPLASTY;  Surgeon: Conrad [place], MD;  Location: [place];  Service: Vascular;  Laterality: Right;  . ENDARTERECTOMY FEMORAL Right 05/17/2015   Procedure: ENDARTERECTOMY FEMORAL;  Surgeon: Angelia Mould, MD;  Location: [place];  Service: Vascular;  Laterality: Right;  . ENDOBRONCHIAL ULTRASOUND Bilateral 09/08/2016   Procedure: ENDOBRONCHIAL ULTRASOUND;  Surgeon: Rigoberto Noel, MD;  Location: [facility] ENDOSCOPY;  Service: Cardiopulmonary;  Laterality: Bilateral;   . EUS N/A 10/08/2017   Procedure: UPPER ENDOSCOPIC ULTRASOUND (EUS) LINEAR;  Surgeon: [name], MD;  Location: [facility] ENDOSCOPY;  Service: Endoscopy;  Laterality: N/A;  . FEMORAL-POPLITEAL BYPASS GRAFT Right 05/17/2015   Procedure: BYPASS GRAFT FEMORAL-BELOW KNEE POPLITEAL ARTERY, using gortex propaten graft 6 mm x 80 cm;  Surgeon: Angelia Mould, MD;  Location: [place];  Service: Vascular;  Laterality: Right;  . IR GENERIC HISTORICAL  09/25/2016   IR FLUORO GUIDE PORT INSERTION RIGHT 09/25/2016 Markus Daft, MD [facility]-INTERV RAD  . IR GENERIC HISTORICAL  09/25/2016   IR US GUIDE VASC ACCESS RIGHT 09/25/2016 Markus Daft, MD [facility]-INTER[facility] RAD  . KNEE ARTHROSCOPY Right 11/2009  . LOWER EXTREMITY ANGIOGRAM Bilateral 03/23/2015   Procedure: Lower Extremity Angiogram;  Surgeon: Angelia Mould, MD;  Location: [place] CV LAB;  Service: Cardiovascular;  Laterality: Bilateral;  . MENISCUS REPAIR  11/2009  . PARTIAL KNEE ARTHROPLASTY Left 12/26/2014   Procedure: UNICOMPARTMENTAL KNEE;  Surgeon: Marchia Bond, MD;  Location: [place];  Service: Orthopedics;  Laterality: Left;  . PATCH ANGIOPLASTY Right 05/17/2015  Procedure: VEIN PATCH ANGIOPLASTY ILEOFEMORAL ARTERY;  Surgeon: Angelia Mould, MD;  Location: [place];  Service: Vascular;  Laterality: Right;  . PERCUTANEOUS STENT INTERVENTION N/A 05/10/2012   Procedure: PERCUTANEOUS STENT INTERVENTION;  Surgeon: Angelia Mould, MD;  Location: [facility] C[facility] LAB;  Service: Cardiovascular;  Laterality: N/A;  . PERIPHERAL VASCULAR CATHETERIZATION N/A 03/23/2015   Procedure: Abdominal Aortogram;  Surgeon: Angelia Mould, MD;  Location: [place] CV LAB;  Service: Cardiovascular;  Laterality: N/A;  . STENTS     PLACED IN ??BOTH LEGS   2013?  . TEE WITHOUT CARDIOVERSION N/A 03/06/2017   Procedure: TRANSESOPHAGEAL ECHOCARDIOGRAM (TEE);  Surgeon: Larey Dresser, MD;  Location: [facility] ENDOSCOPY;  Service: Cardiovascular;  Laterality: N/A;  . TEE WITHOUT  CARDIOVERSION N/A 05/08/2017   Procedure: TRANSESOPHAGEAL ECHOCARDIOGRAM (TEE);  Surgeon: Minna Merritts, MD;  Location: [facility] ORS;  Service: Cardiovascular;  Laterality: N/A;        Home Medications    Prior to Admission medications   Medication Sig Start Date End Date Taking? Authorizing Provider  amiodarone (PACERONE) 200 MG tablet TAKE 1 TABLET BY MOUTH EVERY DAY Patient not taking: Reported on 02/07/2018 02/01/18   Minna Merritts, MD  dexamethasone (DECADRON) 2 MG tablet Take 1 tablet (2 mg total) by mouth 2 (two) times daily. 02/08/18   Hayden Pedro, PA-C  dronabinol (MARINOL) 2.5 MG capsule Take 1 capsule (2.5 mg total) by mouth 2 (two) times daily before a meal. Patient not taking: Reported on 02/07/2018 11/18/17   Maryanna Shape, NP  ELIQUIS 5 MG TABS tablet Take 5 mg by mouth 2 (two) times daily. 01/12/18   [provider]  furosemide (LASIX) 40 MG tablet TAKE 1 TABLET BY MOUTH TWICE A DAY Patient taking differently: TAKE 40 MG BY MOUTH TWICE A DAY 09/09/17   Gollan, Kathlene November, MD  HYDROcodone-acetaminophen (NORCO) 5-325 MG tablet Take 1 tablet by mouth every 6 (six) hours as needed for moderate pain. 01/27/18   Maryanna Shape, NP  insulin glargine (LANTUS) 100 UNIT/ML injection Inject 0.1 mLs (10 Units total) into the skin daily. Patient taking differently: Inject 15 Units into the skin at bedtime.  03/11/17   Regalado, Belkys A, MD  KLOR-CON M20 20 MEQ tablet TAKE 1/2 TABLET (10MEQ) BY MOUTH DAILY. 02/07/18   Curt Bears, MD  lactulose (CHRONULAC) 10 GM/15ML solution Take 30 mLs (20 g total) by mouth daily as needed for mild constipation or moderate constipation. 12/16/17   Maryanna Shape, NP  lidocaine (XYLOCAINE) 2 % solution Use as directed 15 mLs in the mouth or throat 3 (three) times daily before meals. Patient not taking: Reported on 02/07/2018 11/13/17   Hosie Poisson, MD  lidocaine-prilocaine (EMLA) cream Apply 1 application topically as needed. 11/25/17    Maryanna Shape, NP  LORazepam (ATIVAN) 1 MG tablet Take 1 mg by mouth 2 (two) times daily as needed for anxiety.  01/26/18   [provider]  ondansetron (ZOFRAN) 8 MG tablet Take 1 tablet (8 mg total) by mouth every 8 (eight) hours as needed for nausea or vomiting. 02/11/18   Curt Bears, MD  pantoprazole (PROTONIX) 40 MG tablet Take 1 tablet (40 mg total) by mouth 2 (two) times daily for 15 days. Patient taking differently: Take 40 mg by mouth 2 (two) times daily as needed (reflux).  11/13/17 02/07/18  Hosie Poisson, MD  potassium chloride SA (K-DUR,KLOR-CON) 20 MEQ tablet Take 1 tablet (20 mEq total) by mouth 2 (  two) times daily. 01/27/18   Maryanna Shape, NP  prochlorperazine (COMPAZINE) 10 MG tablet TAKE 1 TABLET (10 MG TOTAL) BY MOUTH EVERY 6 (SIX) HOURS AS NEEDED FOR NAUSEA OR VOMITING. 02/10/18   Curt Bears, MD  spironolactone (ALDACTONE) 25 MG tablet Take 1 tablet (25 mg total) by mouth daily. 03/11/17   Regalado, Belkys A, MD  sucralfate (CARAFATE) 1 GM/10ML suspension Take 10 mLs (1 g total) by mouth 4 (four) times daily - after meals and at bedtime. Patient not taking: Reported on 01/28/2018 11/13/17   Hosie Poisson, MD  zolpidem (AMBIEN) 10 MG tablet Take 10 mg by mouth at bedtime.  01/26/15   [provider]    Family History Family History  Problem Relation Age of Onset  . Diabetes Mother   . Hypertension Mother   . Heart disease Mother        Coronary Artery Bypass Graft  . Hyperlipidemia Mother   . Heart attack Mother   . Hypertension Father   . Hyperlipidemia Sister   . Stroke Sister     Social History Social History   Tobacco Use  . Smoking status: Former Smoker    Packs/day: 1.00    Years: 20.00    Pack years: 20.00    Types: Cigarettes  . Smokeless tobacco: Never Used  . Tobacco comment: quit smoking 2008  Substance Use Topics  . Alcohol use: Yes    Alcohol/week: 0.0 oz    Comment: beer- occasionally  . Drug use: No      Allergies   Cialis [tadalafil]   Review of Systems Review of Systems  All other systems reviewed and are negative.    Physical Exam Updated Vital Signs BP 128/90 (BP Location: Right Arm)   Pulse 82   Temp (!) 97.4 F (36.3 C) (Oral)   Resp (!) 23   SpO2 97%   Physical Exam  Constitutional: He is oriented to person, place, and time.  Frail appearing  HENT:  Head: Normocephalic and atraumatic.    Right Ear: External ear normal.  Left Ear: External ear normal.  Nose: Nose normal.  Mouth/Throat: Uvula is midline, oropharynx is clear and moist and mucous membranes are normal. No tonsillar exudate.  Eyes: Pupils are equal, round, and reactive to light. Right eye exhibits no discharge. Left eye exhibits no discharge. No scleral icterus.  Neck: Trachea normal. Neck supple. No spinous process tenderness present. No neck rigidity. Normal range of motion present.  No C-spine tenderness of step offs.   Cardiovascular: Normal rate, regular rhythm and intact distal pulses.  No murmur heard. Pulses:      Radial pulses are 2+ on the right side, and 2+ on the left side.       Dorsalis pedis pulses are 2+ on the right side, and 2+ on the left side.       Posterior tibial pulses are 2+ on the right side, and 2+ on the left side.  No lower extremity swelling or edema. Calves symmetric in size bilaterally.  Pulmonary/Chest: Effort normal and breath sounds normal. He exhibits no tenderness.  No increased work of breathing. No accessory muscle use. Patient is sitting upright, speaking in full sentences without difficulty   Abdominal: Soft. Bowel sounds are normal. He exhibits mass (epigastric). He exhibits no distension. There is tenderness in the epigastric area. There is no rigidity, no rebound, no guarding and no CVA tenderness.  Genitourinary:  Genitourinary Comments: Chaperone was present.  Patient without pain around  the rectal area. Perianal sensory intact. No external fissure's  palpated or examined. No external hemorrhoids noted. No induration of the skin or swelling. Digital Rectal Exam reveals sphincter with good tone. No masses palpated. No impacted stool. Stool color is brown with no overt blood or melena.  Musculoskeletal: He exhibits no edema.  No C, T, L spinous ttp or step offs.   Lymphadenopathy:    He has no cervical adenopathy.  Neurological: He is alert and oriented to person, place, and time.  Moves all 4 extremities without ataxia.   Skin: Skin is warm and dry. Ecchymosis (left shoulder) noted. No rash noted. He is not diaphoretic.  Psychiatric: He has a normal mood and affect.  Nursing note and vitals reviewed.    ED Treatments / Results  Labs (all labs ordered are listed, but only abnormal results are displayed) Labs Reviewed  COMPREHENSIVE METABOLIC PANEL - Abnormal; Notable for the following components:      Result Value   Sodium 132 (*)    Potassium 3.1 (*)    Chloride 94 (*)    Glucose, Bld 141 (*)    BUN 43 (*)    Creatinine, Ser 2.69 (*)    GFR calc non Af Amer 23 (*)    GFR calc Af Amer 27 (*)    All other components within normal limits  CBC WITH DIFFERENTIAL/PLATELET - Abnormal; Notable for the following components:   RBC 3.29 (*)    Hemoglobin 11.4 (*)    HCT 32.7 (*)    MCH 34.7 (*)    Platelets 78 (*)    All other components within normal limits  URINALYSIS, ROUTINE W REFLEX MICROSCOPIC - Abnormal; Notable for the following components:   APPearance CLOUDY (*)    Hgb urine dipstick MODERATE (*)    Protein, ur 30 (*)    Bacteria, UA RARE (*)    All other components within normal limits  MAGNESIUM - Abnormal; Notable for the following components:   Magnesium 2.6 (*)    All other components within normal limits  LIPASE, BLOOD  I-STAT CG4 LACTIC ACID, ED    EKG None  Radiology Ct Head Wo Contrast  Result Date: 02/15/2018 CLINICAL DATA:  Minor head trauma, on anti coagulation, weakness, not able to either walk  since Friday after radiation treatment to the brain, cervical spine trauma, high clinical risk, history lung cancer post radiation therapy, atrial fibrillation, carotid artery disease, hypertension, type II diabetes mellitus EXAM: CT HEAD WITHOUT CONTRAST CT CERVICAL SPINE WITHOUT CONTRAST TECHNIQUE: Multidetector CT imaging of the head and cervical spine was performed following the standard protocol without intravenous contrast. Multiplanar CT image reconstructions of the cervical spine were also generated. COMPARISON:  MR head 02/06/2018 FINDINGS: CT HEAD FINDINGS Brain: Generalized atrophy. Normal ventricular morphology. No midline shift or mass effect. Small vessel chronic ischemic changes of deep cerebral white matter. Deep RIGHT frontal periventricular mass 13 x 14 x 14 mm unchanged. 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 No intracranial hemorrhage, mass lesion, or evidence of acute infarction. No extra-axial fluid collections. Vascular: Atherosclerotic calcifications of internal carotid and vertebral arteries at skull base Skull: Intact Sinuses/Orbits: Visualized paranasal sinuses and mastoid air cells clear Other: N/A CT CERVICAL SPINE FINDINGS Alignment: Normal Skull base and vertebrae: Mild osseous demineralization. Visualized skull base intact. Scattered facet degenerative changes bilaterally. Vertebral body heights maintained without fracture or bone destruction. Disc space narrowing C6-C7. Question ununited ossification center versus sequela of remote trauma at tip of  spinous process of T1. Soft tissues and spinal canal: Prevertebral soft tissues normal thickness. Disc levels:  No additional abnormalities Upper chest: LEFT upper lobe lung scarring.  RIGHT apex clear. Other: Extensive atherosclerotic calcifications in the LEFT carotid system. IMPRESSION: RIGHT periventricular mass lesion likely metastasis in patient with history of lung cancer, grossly stable in size at 14 x 13 x 13 mm.  Atrophy with small vessel chronic ischemic changes of deep cerebral white matter. No acute intracranial abnormalities. Degenerative disc and facet disease changes of the cervical spine. No acute cervical spine abnormalities. Carotid vascular disease. Electronically Signed   By: Lavonia Dana M.D.   On: 02/15/2018 15:37   Ct Cervical Spine Wo Contrast  Result Date: 02/15/2018 CLINICAL DATA:  Minor head trauma, on anti coagulation, weakness, not able to either walk since Friday after radiation treatment to the brain, cervical spine trauma, high clinical risk, history lung cancer post radiation therapy, atrial fibrillation, carotid artery disease, hypertension, type II diabetes mellitus EXAM: CT HEAD WITHOUT CONTRAST CT CERVICAL SPINE WITHOUT CONTRAST TECHNIQUE: Multidetector CT imaging of the head and cervical spine was performed following the standard protocol without intravenous contrast. Multiplanar CT image reconstructions of the cervical spine were also generated. COMPARISON:  MR head 02/06/2018 FINDINGS: CT HEAD FINDINGS Brain: Generalized atrophy. Normal ventricular morphology. No midline shift or mass effect. Small vessel chronic ischemic changes of deep cerebral white matter. Deep RIGHT frontal periventricular mass 13 x 14 x 14 mm unchanged. 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 No intracranial hemorrhage, mass lesion, or evidence of acute infarction. No extra-axial fluid collections. Vascular: Atherosclerotic calcifications of internal carotid and vertebral arteries at skull base Skull: Intact Sinuses/Orbits: Visualized paranasal sinuses and mastoid air cells clear Other: N/A CT CERVICAL SPINE FINDINGS Alignment: Normal Skull base and vertebrae: Mild osseous demineralization. Visualized skull base intact. Scattered facet degenerative changes bilaterally. Vertebral body heights maintained without fracture or bone destruction. Disc space narrowing C6-C7. Question ununited ossification center  versus sequela of remote trauma at tip of spinous process of T1. Soft tissues and spinal canal: Prevertebral soft tissues normal thickness. Disc levels:  No additional abnormalities Upper chest: LEFT upper lobe lung scarring.  RIGHT apex clear. Other: Extensive atherosclerotic calcifications in the LEFT carotid system. IMPRESSION: RIGHT periventricular mass lesion likely metastasis in patient with history of lung cancer, grossly stable in size at 14 x 13 x 13 mm. Atrophy with small vessel chronic ischemic changes of deep cerebral white matter. No acute intracranial abnormalities. Degenerative disc and facet disease changes of the cervical spine. No acute cervical spine abnormalities. Carotid vascular disease. Electronically Signed   By: Lavonia Dana M.D.   On: 02/15/2018 15:37   Dg Shoulder Left  Result Date: 02/15/2018 CLINICAL DATA:  67 year old male with weakness for the past 3 days. 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 No reported trauma. Initial encounter. EXAM: LEFT SHOULDER - 2+ VIEW COMPARISON:  01/25/2018 CT. FINDINGS: Moderate acromioclavicular joint degenerative changes. No fracture or dislocation. No abnormal soft tissue calcifications. No osseous destructive lesion. Post treatment changes left upper lung. Carotid bifurcation calcifications. IMPRESSION: Moderate left acromioclavicular joint degenerative changes. Electronically Signed   By: Genia Del M.D.   On: 02/15/2018 15:42   Dg Abd 2 Views  Result Date: 02/15/2018 CLINICAL DATA:  Weakness with decreased oral intake over the last 3 days. EXAM: ABDOMEN - 2 VIEW COMPARISON:  Radiographs 02/07/2018.  CT 01/25/2018. FINDINGS: There is persistent retained contrast material in the  colon from the prior CT. The bowel gas pattern is normal. There is no free intraperitoneal air or extraluminal contrast material. Bilateral iliac stents and diffuse aortoiliac atherosclerosis are noted. IMPRESSION: Retained  contrast material in the colon consistent with constipation. No acute findings. Electronically Signed   By: Richardean Sale M.D.   On: 02/15/2018 15:42    Procedures Procedures (including critical care time)  Medications Ordered in ED Medications  sodium phosphate (FLEET) 7-19 GM/118ML enema 1 enema (1 enema Rectal Not Given 02/15/18 1900)  sodium chloride 0.9 % bolus 1,000 mL (0 mLs Intravenous Stopped 02/15/18 1757)  potassium chloride SA (K-DUR,KLOR-CON) CR tablet 40 mEq (40 mEq Oral Given 02/15/18 1621)  sodium chloride 0.9 % bolus 500 mL (0 mLs Intravenous Stopped 02/15/18 1619)  sodium chloride 0.9 % bolus 500 mL (0 mLs Intravenous Stopped 02/15/18 1932)  fentaNYL (SUBLIMAZE) injection 25 mcg (25 mcg Intravenous Given 02/15/18 1958)     Initial Impression / Assessment and Plan / ED Course  I have reviewed the triage vital signs and the nursing notes.  Pertinent labs & imaging results that were available during my care of the patient were reviewed by me and considered in my medical decision making (see chart for details).     67 y.o. male with a history of A. 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  She was seen here for the same on 7/7 for the same.  Reports increasing symptoms with worsening appetite, decreased intake, generalized weakness since last visit.  They report he has fallen several times the last few days including one episode of head trauma.  He is on blood thinners.  He denies any neurologic symptoms.  Patient is alert and oriented.  His vital signs are with initial hypotension but when blood pressure cuff was fixed to the right side since resolved.  Lactic acid was checked and within normal limits.  His CBC was without leukocytosis.  Do  not suspect this was caused by sepsis.  Patient will be given fluid boluses in the department.  Patient labs with baseline anemia.  Lipase within normal limits.  Mild hyponatremia and hypokalemia.  Potassium replaced orally.  Elevation of creatinine and BUN consistent with dehydration.  UA without evidence of infection.  CT head and neck without any acute changes.  Normal x-ray with constipation.  DRE without any impacted stool.  Enema ordered.  CT head without evidence of intracranial hemorrhage or skull fracture.  Stable changes noted above.  C-spine CT without evidence of fracture dislocation.  X-rays without evidence of fracture dislocation.  Feel patient would benefit from admission for failure to thrive.  I appreciate Dr. Hal Hope for admitting the patient to the hospitalist service.  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

## 2018-02-15 NOTE — ED Triage Notes (Signed)
Family states that patient weakness and not able to eat and walk has gotten worse since Friday after radiation treatment to the brain. Family adds that voice has gotten weaker.

## 2018-02-15 NOTE — H&P (Signed)
History and Physical    SHOOTER Jesse Avila:[id number] DOB: 01-Sep-1950 DOA: 02/15/2018  PCP: [name], MD  Patient coming from: Home.  Chief Complaint: Weakness and fall.  HPI: AMAHD MORINO is a 67 y.o. male with 3 of atrial fibrillation, diabetes mellitus, anemia and thrombocytopenia likely related to chemotherapy with history of stage IV small cell lung cancer who had recently around 3 days ago had received brain radiation therapy presents to the ER because of worsening weakness unable to ambulate without help and has had multiple falls almost 5 falls last few days.  Patient also has been having abdominal discomfort with poor appetite and nausea vomiting.  ED Course: In the ER CT head and C-spine was not showing anything acute.  X-rays reveal constipation with stool burden.  Patient complains of low back pain which is as per the patient and his wife chronic nothing new.  Given the increasing weakness difficult to ambulate patient admitted for further observation.  Review of Systems: As per HPI, rest all negative.   Past Medical History:  Diagnosis Date  . Atrial fibrillation ([place])   . Carotid artery occlusion   . DJD (degenerative joint disease) of knee   . Essential hypertension, benign    takes Benicar daily  . Goals of care, counseling/discussion 09/18/2016  . History of colon polyps    benign  . History of gout   . History of radiation therapy 09/30/16-11/10/16   left lung 60 Gy in 30 fractions  . Hyperlipidemia    takes Fenofibrate and Crestor daily  . Insomnia    takes Ambien nightly as needed  . Joint pain   . Lung cancer ([place])   . Nocturia   . Peripheral vascular disease ([place])   . Primary localized osteoarthritis of left knee 12/26/2014  . Stroke [facility]) 2015?   takes Plavix daily as well as Pletal,not on plavix at present 05/15/15  . 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 ABDOMINAL AORTAGRAM N/A 03/29/2012   Procedure: ABDOMINAL Maxcine Ham;  Surgeon: Angelia Mould, MD;  Location: [facility] CATH LAB;  Service: Cardiovascular;  Laterality: N/A;  . CARDIOVERSION N/A 05/08/2017   Procedure: CARDIOVERSION;  Surgeon: Minna Merritts, MD;  Location: [facility] ORS;  Service: Cardiovascular;  Laterality: N/A;  . COLONOSCOPY    . ENDARTERECTOMY Right 05/09/2014   Procedure: RIGHT CAROTID ENDARTERECTOMY WITH PATCH ANGIOPLASTY;  Surgeon: Conrad Lake Arthur, MD;  Location: [place];  Service: Vascular;  Laterality: Right;  . ENDARTERECTOMY FEMORAL Right 05/17/2015   Procedure: ENDARTERECTOMY FEMORAL;  Surgeon: Angelia Mould, MD;  Location: [place];  Service: Vascular;  Laterality: Right;  . ENDOBRONCHIAL ULTRASOUND Bilateral 09/08/2016   Procedure: ENDOBRONCHIAL ULTRASOUND;  Surgeon: Rigoberto Noel, MD;  Location: [facility] ENDOSCOPY;  Service: Cardiopulmonary;  Laterality: Bilateral;  . EUS N/A 10/08/2017   Procedure: UPPER ENDOSCOPIC ULTRASOUND (EUS) LINEAR;  Surgeon: [name], MD;  Location: [facility] ENDOSCOPY;  Service: Endoscopy;  Laterality: N/A;  . FEMORAL-POPLITEAL BYPASS GRAFT Right 05/17/2015   Procedure: BYPASS GRAFT FEMORAL-BELOW KNEE POPLITEAL ARTERY, using gortex propaten graft 6 mm x 80 cm;  Surgeon: Angelia Mould, MD;  Location: [place];  Service: Vascular;  Laterality: Right;  . IR GENERIC HISTORICAL  09/25/2016   IR FLUORO GUIDE PORT INSERTION RIGHT 09/25/2016 Markus Daft, MD [facility]-INTERV RAD  .  IR GENERIC HISTORICAL  09/25/2016   IR US GUIDE VASC ACCESS RIGHT 09/25/2016 Markus Daft, MD [facility]INTER[facility]  . KNEE ARTHROSCOPY Right 11/2009  . LOWER EXTREMITY ANGIOGRAM Bilateral 03/23/2015   Procedure: Lower Extremity Angiogram;  Surgeon: Angelia Mould, MD;  Location: [place] CV LAB;  Service: Cardiovascular;  Laterality: Bilateral;  . MENISCUS REPAIR  11/2009  . PARTIAL KNEE ARTHROPLASTY Left  12/26/2014   Procedure: UNICOMPARTMENTAL KNEE;  Surgeon: Marchia Bond, MD;  Location: [place];  Service: Orthopedics;  Laterality: Left;  . PATCH ANGIOPLASTY Right 05/17/2015   Procedure: VEIN PATCH ANGIOPLASTY ILEOFEMORAL ARTERY;  Surgeon: Angelia Mould, MD;  Location: [place];  Service: Vascular;  Laterality: Right;  . PERCUTANEOUS STENT INTERVENTION N/A 05/10/2012   Procedure: PERCUTANEOUS STENT INTERVENTION;  Surgeon: Angelia Mould, MD;  Location: [facility] CATH LAB;  Service: Cardiovascular;  Laterality: N/A;  . PERIPHERAL VASCULAR CATHETERIZATION N/A 03/23/2015   Procedure: Abdominal Aortogram;  Surgeon: Angelia Mould, MD;  Location: [place] CV LAB;  Service: Cardiovascular;  Laterality: N/A;  . STENTS     PLACED IN ??BOTH LEGS   2013?  . TEE WITHOUT CARDIOVERSION N/A 03/06/2017   Procedure: TRANSESOPHAGEAL ECHOCARDIOGRAM (TEE);  Surgeon: Larey Dresser, MD;  Location: [facility] ENDOSCOPY;  Service: Cardiovascular;  Laterality: N/A;  . TEE WITHOUT CARDIOVERSION N/A 05/08/2017   Procedure: TRANSESOPHAGEAL ECHOCARDIOGRAM (TEE);  Surgeon: Minna Merritts, MD;  Location: [facility] ORS;  Service: Cardiovascular;  Laterality: N/A;     reports that he has quit smoking. His smoking use included cigarettes. He has a 20.00 pack-year smoking history. He has never used smokeless tobacco. He reports that he drinks alcohol. He reports that he does not use drugs.  Allergies  Allergen Reactions  . Cialis [Tadalafil] Other (See Comments)    Headache     Family History  Problem Relation Age of Onset  . Diabetes Mother   . Hypertension Mother   . Heart disease Mother        Coronary Artery Bypass Graft  . Hyperlipidemia Mother   . Heart attack Mother   . Hypertension Father   . Hyperlipidemia Sister   . Stroke Sister     Prior to Admission medications   Medication Sig Start Date End Date Taking? Authorizing Provider  dexamethasone (DECADRON) 2 MG tablet Take 1 tablet (2 mg total) by  mouth 2 (two) times daily. 02/08/18  Yes Hayden Pedro, PA-C  ELIQUIS 5 MG TABS tablet Take 5 mg by mouth 2 (two) times daily. 01/12/18  Yes [provider]  furosemide (LASIX) 40 MG tablet TAKE 1 TABLET BY MOUTH TWICE A DAY 09/09/17  Yes Gollan, Kathlene November, MD  HYDROcodone-acetaminophen (NORCO) 5-325 MG tablet Take 1 tablet by mouth every 6 (six) hours as needed for moderate pain. 01/27/18  Yes Curcio, Roselie Awkward, NP  insulin glargine (LANTUS) 100 UNIT/ML injection Inject 0.1 mLs (10 Units total) into the skin daily. Patient taking differently: Inject 15 Units into the skin at bedtime.  03/11/17  Yes Regalado, Belkys A, MD  KLOR-CON M20 20 MEQ tablet TAKE 1/2 TABLET (10MEQ) BY MOUTH DAILY. Patient taking differently: TAKE 1 TABLET (20MEQ) BY MOUTH DAILY. 02/07/18  Yes Curt Bears, MD  lactulose (CHRONULAC) 10 GM/15ML solution Take 30 mLs (20 g total) by mouth daily as needed for mild constipation or moderate constipation. 12/16/17  Yes Curcio, Roselie Awkward, NP  lidocaine-prilocaine (EMLA) cream Apply 1 application topically as needed. 11/25/17  Yes Maryanna Shape, NP  LORazepam (ATIVAN) 1 MG tablet Take 1 mg by mouth 2 (two) times daily as needed for anxiety.  01/26/18  Yes [provider]  ondansetron (ZOFRAN) 8 MG tablet Take 1 tablet (8 mg total) by mouth every 8 (eight) hours as needed for nausea or vomiting. 02/11/18  Yes Curt Bears, MD  pantoprazole (PROTONIX) 40 MG tablet Take 1 tablet (40 mg total) by mouth 2 (two) times daily for 15 days. Patient taking differently: Take 40 mg by mouth 2 (two) times daily as needed (reflux).  11/13/17 02/15/18 Yes Hosie Poisson, MD  prochlorperazine (COMPAZINE) 10 MG tablet TAKE 1 TABLET (10 MG TOTAL) BY MOUTH EVERY 6 (SIX) HOURS AS NEEDED FOR NAUSEA OR VOMITING. 02/10/18  Yes Curt Bears, MD  rosuvastatin (CRESTOR) 40 MG tablet Take 40 mg by mouth daily. 02/10/18  Yes [provider]  spironolactone (ALDACTONE) 25 MG tablet  Take 1 tablet (25 mg total) by mouth daily. 03/11/17  Yes Regalado, Belkys A, MD  zolpidem (AMBIEN) 10 MG tablet Take 10 mg by mouth at bedtime as needed for sleep.  01/26/15  Yes [provider]  amiodarone (PACERONE) 200 MG tablet TAKE 1 TABLET BY MOUTH EVERY DAY Patient not taking: Reported on 02/07/2018 02/01/18   Minna Merritts, MD  dronabinol (MARINOL) 2.5 MG capsule Take 1 capsule (2.5 mg total) by mouth 2 (two) times daily before a meal. Patient not taking: Reported on 02/07/2018 11/18/17   Maryanna Shape, NP  lidocaine (XYLOCAINE) 2 % solution Use as directed 15 mLs in the mouth or throat 3 (three) times daily before meals. Patient not taking: Reported on 02/07/2018 11/13/17   Hosie Poisson, MD  potassium chloride SA (K-DUR,KLOR-CON) 20 MEQ tablet Take 1 tablet (20 mEq total) by mouth 2 (two) times daily. Patient not taking: Reported on 02/15/2018 01/27/18   Maryanna Shape, NP  sucralfate (CARAFATE) 1 GM/10ML suspension Take 10 mLs (1 g total) by mouth 4 (four) times daily - after meals and at bedtime. Patient not taking: Reported on 01/28/2018 11/13/17   Hosie Poisson, MD    Physical Exam: Vitals:   02/15/18 2111 02/15/18 2130 02/15/18 2200 02/15/18 2242  BP: 132/86 116/69  134/87  Pulse: 77  77 74  Resp: (!) 24  16 (!) 27  Temp:      TempSrc:      SpO2: 99%  99% 100%      Constitutional: Moderately built and nourished. Vitals:   02/15/18 2111 02/15/18 2130 02/15/18 2200 02/15/18 2242  BP: 132/86 116/69  134/87  Pulse: 77  77 74  Resp: (!) 24  16 (!) 27  Temp:      TempSrc:      SpO2: 99%  99% 100%   Eyes: Anicteric no pallor. ENMT: No discharge from the ears eyes nose or mouth. Neck: No mass felt.  No neck rigidity no JVD appreciated. Respiratory: No rhonchi or crepitations. Cardiovascular: S1-S2 heard no murmurs appreciated. Abdomen: Soft nontender bowel sounds present. Musculoskeletal: No edema.  No joint effusion. Skin: Chronic skin changes. Neurologic:  Alert awake oriented to time place and person.  Moves all extremities but generally weak.  No definite focal deficits. Psychiatric: Appears normal per normal affect.   Labs on Admission: I have personally reviewed following labs and imaging studies  CBC: Recent Labs  Lab 02/15/18 1414  WBC 9.3  NEUTROABS 7.6  HGB 11.4*  HCT 32.7*  MCV 99.4  PLT 78*   Basic Metabolic Panel: Recent Labs  Lab 02/15/18 1414  NA 132*  K 3.1*  CL 94*  CO2 26  GLUCOSE 141*  BUN 43*  CREATININE 2.69*  CALCIUM 9.4  MG 2.6*   GFR: Estimated Creatinine Clearance: 27 mL/min (A) (by C-G formula based on SCr of 2.69 mg/dL (H)). Liver Function Tests: Recent Labs  Lab 02/15/18 1414  AST 17  ALT 26  ALKPHOS 59  BILITOT 0.9  PROT 6.8  ALBUMIN 3.9   Recent Labs  Lab 02/15/18 1414  LIPASE 32   No results for input(s): AMMONIA in the last 168 hours. Coagulation Profile: No results for input(s): INR, PROTIME in the last 168 hours. Cardiac Enzymes: No results for input(s): CKTOTAL, CKMB, CKMBINDEX, TROPONINI in the last 168 hours. BNP (last 3 results) No results for input(s): PROBNP in the last 8760 hours. HbA1C: No results for input(s): HGBA1C in the last 72 hours. CBG: No results for input(s): GLUCAP in the last 168 hours. Lipid Profile: No results for input(s): CHOL, HDL, LDLCALC, TRIG, CHOLHDL, LDLDIRECT in the last 72 hours. Thyroid Function Tests: No results for input(s): TSH, T4TOTAL, FREET4, T3FREE, THYROIDAB in the last 72 hours. Anemia Panel: No results for input(s): VITAMINB12, FOLATE, FERRITIN, TIBC, IRON, RETICCTPCT in the last 72 hours. Urine analysis:    Component Value Date/Time   COLORURINE YELLOW 02/15/2018 1718   APPEARANCEUR CLOUDY (A) 02/15/2018 1718   LABSPEC 1.014 02/15/2018 1718   PHURINE 5.0 02/15/2018 1718   GLUCOSEU NEGATIVE 02/15/2018 1718   HGBUR MODERATE (A) 02/15/2018 1718   BILIRUBINUR NEGATIVE 02/15/2018 1718   KETONESUR NEGATIVE 02/15/2018 1718    PROTEINUR 30 (A) 02/15/2018 1718   UROBILINOGEN 0.2 05/15/2015 0900   NITRITE NEGATIVE 02/15/2018 1718   LEUKOCYTESUR NEGATIVE 02/15/2018 1718   Sepsis Labs: @LABRCNTIP (procalcitonin:4,lacticidven:4) )No results found for this or any previous visit (from the past 240 hour(s)).   Radiological Exams on Admission: Ct Head Wo Contrast  Result Date: 02/15/2018 CLINICAL DATA:  Minor head trauma, on anti coagulation, weakness, not able to either walk since Friday after radiation treatment to the brain, cervical spine trauma, high clinical risk, history lung cancer post radiation therapy, atrial fibrillation, carotid artery disease, hypertension, type II diabetes mellitus EXAM: CT HEAD WITHOUT CONTRAST CT CERVICAL SPINE WITHOUT CONTRAST TECHNIQUE: Multidetector CT imaging of the head and cervical spine was performed following the standard protocol without intravenous contrast. Multiplanar CT image reconstructions of the cervical spine were also generated. COMPARISON:  MR head 02/06/2018 FINDINGS: CT HEAD FINDINGS Brain: Generalized atrophy. Normal ventricular morphology. No midline shift or mass effect. Small vessel chronic ischemic changes of deep cerebral white matter. Deep RIGHT frontal periventricular mass 13 x 14 x 14 mm unchanged. 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 No intracranial hemorrhage, mass lesion, or evidence of acute infarction. No extra-axial fluid collections. Vascular: Atherosclerotic calcifications of internal carotid and vertebral arteries at skull base Skull: Intact Sinuses/Orbits: Visualized paranasal sinuses and mastoid air cells clear Other: N/A CT CERVICAL SPINE FINDINGS Alignment: Normal Skull base and vertebrae: Mild osseous demineralization. Visualized skull base intact. Scattered facet degenerative changes bilaterally. Vertebral body heights maintained without fracture or bone destruction. Disc space narrowing C6-C7. Question ununited ossification center versus sequela  of remote trauma at tip of spinous process of T1. Soft tissues and spinal canal: Prevertebral soft tissues normal thickness. Disc levels:  No additional abnormalities Upper chest: LEFT upper lobe lung scarring.  RIGHT apex clear. Other: Extensive atherosclerotic calcifications in the LEFT carotid system. IMPRESSION: RIGHT periventricular mass lesion likely metastasis  in patient with history of lung cancer, grossly stable in size at 14 x 13 x 13 mm. Atrophy with small vessel chronic ischemic changes of deep cerebral white matter. No acute intracranial abnormalities. Degenerative disc and facet disease changes of the cervical spine. No acute cervical spine abnormalities. Carotid vascular disease. Electronically Signed   By: Lavonia Dana M.D.   On: 02/15/2018 15:37   Ct Cervical Spine Wo Contrast  Result Date: 02/15/2018 CLINICAL DATA:  Minor head trauma, on anti coagulation, weakness, not able to either walk since Friday after radiation treatment to the brain, cervical spine trauma, high clinical risk, history lung cancer post radiation therapy, atrial fibrillation, carotid artery disease, hypertension, type II diabetes mellitus EXAM: CT HEAD WITHOUT CONTRAST CT CERVICAL SPINE WITHOUT CONTRAST TECHNIQUE: Multidetector CT imaging of the head and cervical spine was performed following the standard protocol without intravenous contrast. Multiplanar CT image reconstructions of the cervical spine were also generated. COMPARISON:  MR head 02/06/2018 FINDINGS: CT HEAD FINDINGS Brain: Generalized atrophy. Normal ventricular morphology. No midline shift or mass effect. Small vessel chronic ischemic changes of deep cerebral white matter. Deep RIGHT frontal periventricular mass 13 x 14 x 14 mm unchanged. 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 No intracranial hemorrhage, mass lesion, or evidence of acute infarction. No extra-axial fluid collections. Vascular: Atherosclerotic calcifications of internal carotid and  vertebral arteries at skull base Skull: Intact Sinuses/Orbits: Visualized paranasal sinuses and mastoid air cells clear Other: N/A CT CERVICAL SPINE FINDINGS Alignment: Normal Skull base and vertebrae: Mild osseous demineralization. Visualized skull base intact. Scattered facet degenerative changes bilaterally. Vertebral body heights maintained without fracture or bone destruction. Disc space narrowing C6-C7. Question ununited ossification center versus sequela of remote trauma at tip of spinous process of T1. Soft tissues and spinal canal: Prevertebral soft tissues normal thickness. Disc levels:  No additional abnormalities Upper chest: LEFT upper lobe lung scarring.  RIGHT apex clear. Other: Extensive atherosclerotic calcifications in the LEFT carotid system. IMPRESSION: RIGHT periventricular mass lesion likely metastasis in patient with history of lung cancer, grossly stable in size at 14 x 13 x 13 mm. Atrophy with small vessel chronic ischemic changes of deep cerebral white matter. No acute intracranial abnormalities. Degenerative disc and facet disease changes of the cervical spine. No acute cervical spine abnormalities. Carotid vascular disease. Electronically Signed   By: Lavonia Dana M.D.   On: 02/15/2018 15:37   Dg Shoulder Left  Result Date: 02/15/2018 CLINICAL DATA:  68 year old male with weakness for the past 3 days. 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 No reported trauma. Initial encounter. EXAM: LEFT SHOULDER - 2+ VIEW COMPARISON:  01/25/2018 CT. FINDINGS: Moderate acromioclavicular joint degenerative changes. No fracture or dislocation. No abnormal soft tissue calcifications. No osseous destructive lesion. Post treatment changes left upper lung. Carotid bifurcation calcifications. IMPRESSION: Moderate left acromioclavicular joint degenerative changes. Electronically Signed   By: Genia Del M.D.   On: 02/15/2018 15:42   Dg Abd 2 Views  Result Date:  02/15/2018 CLINICAL DATA:  Weakness with decreased oral intake over the last 3 days. EXAM: ABDOMEN - 2 VIEW COMPARISON:  Radiographs 02/07/2018.  CT 01/25/2018. FINDINGS: There is persistent retained contrast material in the colon from the prior CT. The bowel gas pattern is normal. There is no free intraperitoneal air or extraluminal contrast material. Bilateral iliac stents and diffuse aortoiliac atherosclerosis are noted. IMPRESSION: Retained contrast material in the colon consistent with constipation. No acute findings. Electronically Signed  By: Richardean Sale M.D.   On: 02/15/2018 15:42     Assessment/Plan Principal Problem:   Weakness Active Problems:   Essential hypertension, benign   PAD (peripheral artery disease) (HCC)   Small cell lung carcinoma, left (HCC)   Atrial fibrillation (HCC)   CKD (chronic kidney disease) stage 4, GFR 15-29 ml/min (HCC)    1. Generalized weakness failure to thrive and frequent falls likely from progressive metastatic disease from small cell lung cancer  -we will get physical therapy consult palliative care consult.  Continue Decadron.  Was given fluid in the ER.  Will hold Lasix and spironolactone for now due to weakness. 2. Atrial fibrillation on Eliquis and amiodarone. 3. Diabetes mellitus type 2 -we will keep patient on sliding scale coverage and decrease Lantus insulin from 15 units to 5 units and change timing from nights today.  Closely follow for any hypoglycemia. 4. Anemia and thrombocytopenia likely related to chemotherapy.  Follow CBC. 5. Chronic kidney disease stage III with mild worsening of creatinine follow metabolic panel. 6. Stage IV small cell lung cancer being followed by Dr. Julien Nordmann.   DVT prophylaxis: Apixaban. Code Status: DNR. Family Communication: Patient's wife. Disposition Plan: To be determined. Consults called: Palliative care. Admission status: Observation.   Rise Patience MD [facility] Pager [phone].  If 7PM-7AM, please contact night-coverage [email or web address] Password [facility]  02/15/2018, 10:54 PM

## 2018-02-15 NOTE — ED Notes (Signed)
Pt asked for 1 dose of pain medication prior to transport. Verbal order for dilaudid 0.5 mg given

## 2018-02-16 ENCOUNTER — Other Ambulatory Visit: Payer: Self-pay

## 2018-02-16 DIAGNOSIS — R627 Adult failure to thrive: Secondary | ICD-10-CM | POA: Diagnosis not present

## 2018-02-16 DIAGNOSIS — I482 Chronic atrial fibrillation: Secondary | ICD-10-CM | POA: Diagnosis present

## 2018-02-16 DIAGNOSIS — I4891 Unspecified atrial fibrillation: Secondary | ICD-10-CM | POA: Diagnosis not present

## 2018-02-16 DIAGNOSIS — E43 Unspecified severe protein-calorie malnutrition: Secondary | ICD-10-CM

## 2018-02-16 DIAGNOSIS — E1151 Type 2 diabetes mellitus with diabetic peripheral angiopathy without gangrene: Secondary | ICD-10-CM | POA: Diagnosis present

## 2018-02-16 DIAGNOSIS — C7931 Secondary malignant neoplasm of brain: Secondary | ICD-10-CM | POA: Diagnosis present

## 2018-02-16 DIAGNOSIS — C3492 Malignant neoplasm of unspecified part of left bronchus or lung: Secondary | ICD-10-CM | POA: Diagnosis present

## 2018-02-16 DIAGNOSIS — Z823 Family history of stroke: Secondary | ICD-10-CM | POA: Diagnosis not present

## 2018-02-16 DIAGNOSIS — Z85118 Personal history of other malignant neoplasm of bronchus and lung: Secondary | ICD-10-CM | POA: Diagnosis not present

## 2018-02-16 DIAGNOSIS — K219 Gastro-esophageal reflux disease without esophagitis: Secondary | ICD-10-CM | POA: Diagnosis present

## 2018-02-16 DIAGNOSIS — Z681 Body mass index (BMI) 19 or less, adult: Secondary | ICD-10-CM | POA: Diagnosis not present

## 2018-02-16 DIAGNOSIS — Z66 Do not resuscitate: Secondary | ICD-10-CM | POA: Diagnosis present

## 2018-02-16 DIAGNOSIS — T451X5A Adverse effect of antineoplastic and immunosuppressive drugs, initial encounter: Secondary | ICD-10-CM | POA: Diagnosis present

## 2018-02-16 DIAGNOSIS — I951 Orthostatic hypotension: Secondary | ICD-10-CM | POA: Diagnosis present

## 2018-02-16 DIAGNOSIS — E861 Hypovolemia: Secondary | ICD-10-CM | POA: Diagnosis present

## 2018-02-16 DIAGNOSIS — G4700 Insomnia, unspecified: Secondary | ICD-10-CM | POA: Diagnosis present

## 2018-02-16 DIAGNOSIS — Z7901 Long term (current) use of anticoagulants: Secondary | ICD-10-CM | POA: Diagnosis not present

## 2018-02-16 DIAGNOSIS — I129 Hypertensive chronic kidney disease with stage 1 through stage 4 chronic kidney disease, or unspecified chronic kidney disease: Secondary | ICD-10-CM | POA: Diagnosis present

## 2018-02-16 DIAGNOSIS — E785 Hyperlipidemia, unspecified: Secondary | ICD-10-CM | POA: Diagnosis present

## 2018-02-16 DIAGNOSIS — D6481 Anemia due to antineoplastic chemotherapy: Secondary | ICD-10-CM | POA: Diagnosis not present

## 2018-02-16 DIAGNOSIS — E876 Hypokalemia: Secondary | ICD-10-CM

## 2018-02-16 DIAGNOSIS — Z794 Long term (current) use of insulin: Secondary | ICD-10-CM | POA: Diagnosis not present

## 2018-02-16 DIAGNOSIS — Z86010 Personal history of colonic polyps: Secondary | ICD-10-CM | POA: Diagnosis not present

## 2018-02-16 DIAGNOSIS — N179 Acute kidney failure, unspecified: Secondary | ICD-10-CM | POA: Diagnosis not present

## 2018-02-16 DIAGNOSIS — Z923 Personal history of irradiation: Secondary | ICD-10-CM | POA: Diagnosis not present

## 2018-02-16 DIAGNOSIS — R531 Weakness: Secondary | ICD-10-CM | POA: Diagnosis not present

## 2018-02-16 DIAGNOSIS — E860 Dehydration: Secondary | ICD-10-CM | POA: Diagnosis present

## 2018-02-16 DIAGNOSIS — D6959 Other secondary thrombocytopenia: Secondary | ICD-10-CM | POA: Diagnosis present

## 2018-02-16 DIAGNOSIS — N184 Chronic kidney disease, stage 4 (severe): Secondary | ICD-10-CM | POA: Diagnosis present

## 2018-02-16 DIAGNOSIS — E871 Hypo-osmolality and hyponatremia: Secondary | ICD-10-CM | POA: Diagnosis present

## 2018-02-16 DIAGNOSIS — I10 Essential (primary) hypertension: Secondary | ICD-10-CM | POA: Diagnosis not present

## 2018-02-16 DIAGNOSIS — J449 Chronic obstructive pulmonary disease, unspecified: Secondary | ICD-10-CM | POA: Diagnosis present

## 2018-02-16 LAB — CBC WITH DIFFERENTIAL/PLATELET
BASOS ABS: 0 10*3/uL (ref 0.0–0.1)
Basophils Relative: 0 %
Eosinophils Absolute: 0 10*3/uL (ref 0.0–0.7)
Eosinophils Relative: 0 %
HEMATOCRIT: 29.5 % — AB (ref 39.0–52.0)
Hemoglobin: 10.1 g/dL — ABNORMAL LOW (ref 13.0–17.0)
LYMPHS PCT: 14 %
Lymphs Abs: 0.9 10*3/uL (ref 0.7–4.0)
MCH: 34.5 pg — AB (ref 26.0–34.0)
MCHC: 34.2 g/dL (ref 30.0–36.0)
MCV: 100.7 fL — AB (ref 78.0–100.0)
Monocytes Absolute: 0.3 10*3/uL (ref 0.1–1.0)
Monocytes Relative: 5 %
NEUTROS ABS: 5.2 10*3/uL (ref 1.7–7.7)
Neutrophils Relative %: 81 %
Platelets: 65 10*3/uL — ABNORMAL LOW (ref 150–400)
RBC: 2.93 MIL/uL — AB (ref 4.22–5.81)
RDW: 15.5 % (ref 11.5–15.5)
WBC: 6.4 10*3/uL (ref 4.0–10.5)

## 2018-02-16 LAB — HEPATIC FUNCTION PANEL
ALT: 24 U/L (ref 0–44)
AST: 16 U/L (ref 15–41)
Albumin: 3.4 g/dL — ABNORMAL LOW (ref 3.5–5.0)
Alkaline Phosphatase: 53 U/L (ref 38–126)
BILIRUBIN INDIRECT: 0.3 mg/dL (ref 0.3–0.9)
Bilirubin, Direct: 0.1 mg/dL (ref 0.0–0.2)
TOTAL PROTEIN: 6.2 g/dL — AB (ref 6.5–8.1)
Total Bilirubin: 0.4 mg/dL (ref 0.3–1.2)

## 2018-02-16 LAB — BASIC METABOLIC PANEL
Anion gap: 10 (ref 5–15)
BUN: 39 mg/dL — AB (ref 8–23)
CO2: 23 mmol/L (ref 22–32)
CREATININE: 2.54 mg/dL — AB (ref 0.61–1.24)
Calcium: 8.8 mg/dL — ABNORMAL LOW (ref 8.9–10.3)
Chloride: 100 mmol/L (ref 98–111)
GFR, EST AFRICAN AMERICAN: 29 mL/min — AB (ref >60)
GFR, EST NON AFRICAN AMERICAN: 25 mL/min — AB (ref >60)
Glucose, Bld: 162 mg/dL — ABNORMAL HIGH (ref 70–99)
Potassium: 3.3 mmol/L — ABNORMAL LOW (ref 3.5–5.1)
Sodium: 133 mmol/L — ABNORMAL LOW (ref 135–145)

## 2018-02-16 LAB — GLUCOSE, CAPILLARY
GLUCOSE-CAPILLARY: 144 mg/dL — AB (ref 70–99)
GLUCOSE-CAPILLARY: 199 mg/dL — AB (ref 70–99)
GLUCOSE-CAPILLARY: 259 mg/dL — AB (ref 70–99)
Glucose-Capillary: 142 mg/dL — ABNORMAL HIGH (ref 70–99)

## 2018-02-16 MED ORDER — POLYETHYLENE GLYCOL 3350 17 G PO PACK
17.0000 g | PACK | Freq: Two times a day (BID) | ORAL | Status: AC
  Administered 2018-02-16 – 2018-02-17 (×4): 17 g via ORAL
  Filled 2018-02-16 (×3): qty 1

## 2018-02-16 MED ORDER — BOOST / RESOURCE BREEZE PO LIQD CUSTOM
1.0000 | ORAL | Status: DC
  Administered 2018-02-16 – 2018-02-17 (×2): 1 via ORAL

## 2018-02-16 MED ORDER — SODIUM CHLORIDE 0.9 % IV BOLUS
500.0000 mL | Freq: Once | INTRAVENOUS | Status: AC
  Administered 2018-02-16: 500 mL via INTRAVENOUS

## 2018-02-16 MED ORDER — SODIUM CHLORIDE 0.9 % IV SOLN
INTRAVENOUS | Status: DC
  Administered 2018-02-16 (×2): via INTRAVENOUS

## 2018-02-16 NOTE — Progress Notes (Signed)
[facility]  Patient Status: New  [facility] is providing the following services: RN and PT  If patient discharges after hours, please call [phone].   Jesse Avila 02/16/2018, 3:53 PM

## 2018-02-16 NOTE — Consult Note (Signed)
[place] Nurse wound consult note Evaluation of bilateral forearm abrasions completed in [facility] 1426. Reason for Consult: Bilateral forearm abrasions.  All with foam dressings intact at the time of my evaluation. Wound type: Pre-hospital abrasions Wound beds:  The left and right elbow areas and the left wrist have abrasions, all of which have pink wound beds. Drainage (amount, consistency, odor) Dried blood on dressings, no odor, no induration, no overt s/s of infection Periwound: Fragile with areas of purpura Dressing procedure/placement/frequency: Continue the use of foam dressings to site to absorb any exudate and provide protection.  Also, when changing foam dressings to forearm abrasions, please use saline to loosen dressings if adhered to wound areas.  Foam dressings can be left in place for 3 days. Monitor the wound area(s) for worsening of condition such as: Signs/symptoms of infection,  Increase in size,  Development of or worsening of odor, Development of pain, or increased pain at the affected locations.  Notify the medical team if any of these develop.  Thank you for the consult.  Discussed plan of care with the patient and bedside nurse.  [place] nurse will not follow at this time.  Please re-consult the [place] team if needed.  Val Riles, RN, MSN, CWOCN, CNS-BC, pager [phone]

## 2018-02-16 NOTE — Progress Notes (Signed)
NO CHARGE NOTE  PALLIATIVE MEDICINE:   Briefly met with patient today. He stated he would like to wait to continue our [place] discussion so that his wife could be present. Left voicemail with wife. Will await a return call from her to let me know what time she will be available to meet tomorrow.   Detailed note and recommendations to follow goals of care discussion on 7/17.   Thank you for your referral.   Alda Lea, NP-BC Palliative Medicine Team  Phone: [phone] Fax: [phone] Pager: [phone] Amion: [place]

## 2018-02-16 NOTE — Progress Notes (Addendum)
[facility] PROGRESS NOTE    Progress Note  Jesse Avila  KWI:[id number] DOB: [birth year] DOA: 02/15/2018 PCP: [name], MD     Brief Narrative:   Jesse Avila is an 67 y.o. 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  Chronic atrial fibrillation: Rate controlled, continue amiodarone and hold Eliquis due to thrombocytopenia.  Diabetes mellitus type 2: Continue Lantus plus sliding scale insulin.  Antineoplastic chemotherapy-induced anemia and thrombocytopenia likely due to chemotherapy: Hemoglobin likely due to hemoconcentration we will continue to monitor. There continues to be a drop in platelets, hold Eliquis place her on SCDs.  He has multiple bruises throughout his body.  Essential hypertension, benign Hold antihypertensive medication.  Electrolyte imbalance/hypokalemia and hyponatremia: Replete orally, continue IV fluids.    Possible AKI on Chronic renal disease stage III: I think there is a degree of acute kidney injury, going back to her chart of renal function has been running from 1.4-2.0 on admission it was 2.7. We will start on gentle IV fluid hydration hold diuretics and recheck a basic metabolic panel in the morning.  Small cell lung carcinoma, left ([place]): We will need to follow-up with  oncology as an outpatient. He would like to finish his radiation treatment and would like not to get any further radiation. He will also like to meet with hospice and palliative care  Constipation: We will start MiraLAX p.o. twice daily give him a soapsuds enema.  DVT prophylaxis: SCD Family Communication:none Disposition Plan/Barrier to D/C: none Code Status:     Code Status Orders  (From admission, onward)        Start     Ordered   02/15/18 2252  Do not attempt resuscitation (DNR)  Continuous    Question Answer Comment  In the event of cardiac or respiratory ARREST Do not call a "code blue"   In the event of cardiac or respiratory ARREST Do not perform Intubation, CPR, defibrillation or ACLS   In the event of cardiac or respiratory ARREST Use medication by any route, position, wound care, and other measures to relive pain and suffering. May use oxygen, suction and manual treatment of airway obstruction as needed for comfort.      02/15/18 2253    Code Status History    Date Active Date Inactive Code Status Order ID Comments User Context   11/09/2017 2000 11/13/2017 1801 Full Code [id number]  Norval Morton, MD ED   03/01/2017 0459 03/10/2017 1844 Full Code [id number]  Etta Quill, DO ED   02/02/2017 1756 02/05/2017 2012 Full Code [id number]  Reubin Milan, MD Inpatient   10/21/2016 2204 10/22/2016 2127 Full Code [id number]  Rama, Venetia Maxon, MD Inpatient   05/17/2015 2210 05/20/2015 1649 Full Code [id number]  Gabriel Earing, PA-C Inpatient   03/23/2015 1434 03/23/2015 2235 Full Code [id number]  Angelia Mould, MD Inpatient   12/26/2014 1409 12/27/2014 1619  Full Code [id number]  Marchia Bond, MD Inpatient   05/09/2014 1424 05/10/2014 1322 Full Code [id number]  Orbie Hurst Inpatient   04/14/2014 1703 04/16/2014 1759 Full Code [id number]  Louellen Molder, MD Inpatient        IV Access:    Peripheral IV   Procedures and diagnostic studies:   Ct Head Wo  Contrast  Result Date: 02/15/2018 CLINICAL DATA:  Minor head trauma, on anti coagulation, weakness, not able to either walk since Friday after radiation treatment to the brain, cervical spine trauma, high clinical risk, history lung cancer post radiation therapy, atrial fibrillation, carotid artery disease, hypertension, type II diabetes mellitus EXAM: CT HEAD WITHOUT CONTRAST CT CERVICAL SPINE WITHOUT CONTRAST TECHNIQUE: Multidetector CT imaging of the head and cervical spine was performed following the standard protocol without intravenous contrast. Multiplanar CT image reconstructions of the cervical spine were also generated. COMPARISON:  MR head 02/06/2018 FINDINGS: CT HEAD FINDINGS Brain: Generalized atrophy. Normal ventricular morphology. No midline shift or mass effect. Small vessel chronic ischemic changes of deep cerebral white matter. Deep RIGHT frontal periventricular mass 13 x 14 x 14 mm unchanged. 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 No intracranial hemorrhage, mass lesion, or evidence of acute infarction. No extra-axial fluid collections. Vascular: Atherosclerotic calcifications of internal carotid and vertebral arteries at skull base Skull: Intact Sinuses/Orbits: Visualized paranasal sinuses and mastoid air cells clear Other: N/A CT CERVICAL SPINE FINDINGS Alignment: Normal Skull base and vertebrae: Mild osseous demineralization. Visualized skull base intact. Scattered facet degenerative changes bilaterally. Vertebral body heights maintained without fracture or bone destruction. Disc space narrowing C6-C7. Question ununited ossification center versus sequela of remote trauma at tip of spinous process of T1. Soft tissues and spinal canal: Prevertebral soft tissues normal thickness. Disc levels:  No additional abnormalities Upper chest: LEFT upper lobe lung scarring.  RIGHT apex clear. Other: Extensive atherosclerotic calcifications in the LEFT carotid system. IMPRESSION: RIGHT  periventricular mass lesion likely metastasis in patient with history of lung cancer, grossly stable in size at 14 x 13 x 13 mm. Atrophy with small vessel chronic ischemic changes of deep cerebral white matter. No acute intracranial abnormalities. Degenerative disc and facet disease changes of the cervical spine. No acute cervical spine abnormalities. Carotid vascular disease. Electronically Signed   By: Lavonia Dana M.D.   On: 02/15/2018 15:37   Ct Cervical Spine Wo Contrast  Result Date: 02/15/2018 CLINICAL DATA:  Minor head trauma, on anti coagulation, weakness, not able to either walk since Friday after radiation treatment to the brain, cervical spine trauma, high clinical risk, history lung cancer post radiation therapy, atrial fibrillation, carotid artery disease, hypertension, type II diabetes mellitus EXAM: CT HEAD WITHOUT CONTRAST CT CERVICAL SPINE WITHOUT CONTRAST TECHNIQUE: Multidetector CT imaging of the head and cervical spine was performed following the standard protocol without intravenous contrast. Multiplanar CT image reconstructions of the cervical spine were also generated. COMPARISON:  MR head 02/06/2018 FINDINGS: CT HEAD FINDINGS Brain: Generalized atrophy. Normal ventricular morphology. No midline shift or mass effect. Small vessel chronic ischemic changes of deep cerebral white matter. Deep RIGHT frontal periventricular mass 13 x 14 x 14 mm unchanged. 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 No intracranial hemorrhage, mass lesion, or evidence of acute infarction. No extra-axial fluid collections. Vascular: Atherosclerotic calcifications of internal carotid and vertebral arteries at skull base Skull: Intact Sinuses/Orbits: Visualized paranasal sinuses and mastoid air cells clear Other: N/A CT CERVICAL SPINE FINDINGS Alignment: Normal Skull base and vertebrae: Mild osseous  demineralization. Visualized skull base intact. Scattered facet degenerative changes bilaterally. Vertebral  body heights maintained without fracture or bone destruction. Disc space narrowing C6-C7. Question ununited ossification center versus sequela of remote trauma at tip of spinous process of T1. Soft tissues and spinal canal: Prevertebral soft tissues normal thickness. Disc levels:  No additional abnormalities Upper chest: LEFT upper lobe lung scarring.  RIGHT apex clear. Other: Extensive atherosclerotic calcifications in the LEFT carotid system. IMPRESSION: RIGHT periventricular mass lesion likely metastasis in patient with history of lung cancer, grossly stable in size at 14 x 13 x 13 mm. Atrophy with small vessel chronic ischemic changes of deep cerebral white matter. No acute intracranial abnormalities. Degenerative disc and facet disease changes of the cervical spine. No acute cervical spine abnormalities. Carotid vascular disease. Electronically Signed   By: Lavonia Dana M.D.   On: 02/15/2018 15:37   Dg Shoulder Left  Result Date: 02/15/2018 CLINICAL DATA:  67 year old male with weakness for the past 3 days. 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 No reported trauma. Initial encounter. EXAM: LEFT SHOULDER - 2+ VIEW COMPARISON:  01/25/2018 CT. FINDINGS: Moderate acromioclavicular joint degenerative changes. No fracture or dislocation. No abnormal soft tissue calcifications. No osseous destructive lesion. Post treatment changes left upper lung. Carotid bifurcation calcifications. IMPRESSION: Moderate left acromioclavicular joint degenerative changes. Electronically Signed   By: Genia Del M.D.   On: 02/15/2018 15:42   Dg Abd 2 Views  Result Date: 02/15/2018 CLINICAL DATA:  Weakness with decreased oral intake over the last 3 days. EXAM: ABDOMEN - 2 VIEW COMPARISON:  Radiographs 02/07/2018.  CT 01/25/2018. FINDINGS: There is persistent retained contrast material in the colon from the prior CT. The bowel gas pattern is normal. There is no free intraperitoneal air or  extraluminal contrast material. Bilateral iliac stents and diffuse aortoiliac atherosclerosis are noted. IMPRESSION: Retained contrast material in the colon consistent with constipation. No acute findings. Electronically Signed   By: Richardean Sale M.D.   On: 02/15/2018 15:42     Medical Consultants:    None.  Anti-Infectives:   None  Subjective:    Jesse Avila he relates he is still lightheaded upon standing or sitting here in the room anorexic  Objective:    Vitals:   02/15/18 2200 02/15/18 2242 02/15/18 2308 02/16/18 0516  BP:  134/87 122/83 126/79  Pulse: 77 74 74 76  Resp: 16 (!) 27 18 18   Temp:   98.2 F (36.8 C) 97.8 F (36.6 C)  TempSrc:   Oral Oral  SpO2: 99% 100% 97% 99%  Weight:   63.6 kg (140 lb 3.4 oz)   Height:   6\' 1"  (1.854 m)     Intake/Output Summary (Last 24 hours) at 02/16/2018 0807 Last data filed at 02/16/2018 0700 Gross per 24 hour  Intake 422.5 ml  Output 525 ml  Net -102.5 ml   Filed Weights   02/15/18 2308  Weight: 63.6 kg (140 lb 3.4 oz)    Exam: General exam: In no acute distress, cachectic Respiratory system: Good air movement and clear to auscultation. Cardiovascular system: S1 & S2 heard, RRR.  Gastrointestinal system: Abdomen is nondistended, soft and nontender.  Central nervous system: Alert and oriented. No focal neurological deficits. Extremities: No pedal edema. Skin: Multiple bruises throughout his body Psychiatry: Judgement and insight appear normal. Mood & affect appropriate.    Data Reviewed:    Labs: Basic Metabolic Panel: Recent Labs  Lab 02/15/18 1414 02/16/18 0526  NA 132* 133*  K 3.1* 3.3*  CL 94* 100  CO2 26 23  GLUCOSE 141* 162*  BUN 43* 39*  CREATININE 2.69* 2.54*  CALCIUM 9.4 8.8*  MG 2.6*  --    GFR Estimated Creatinine Clearance: 26.4 mL/min (A) (by C-G formula based on SCr of 2.54 mg/dL (H)). Liver Function Tests: Recent Labs  Lab 02/15/18 1414 02/16/18 0526  AST 17 16  ALT 26 24    ALKPHOS 59 53  BILITOT 0.9 0.4  PROT 6.8 6.2*  ALBUMIN 3.9 3.4*   Recent Labs  Lab 02/15/18 1414  LIPASE 32   No results for input(s): AMMONIA in the last 168 hours. Coagulation profile No results for input(s): INR, PROTIME in the last 168 hours.  CBC: Recent Labs  Lab 02/15/18 1414 02/16/18 0526  WBC 9.3 6.4  NEUTROABS 7.6 5.2  HGB 11.4* 10.1*  HCT 32.7* 29.5*  MCV 99.4 100.7*  PLT 78* 65*   Cardiac Enzymes: No results for input(s): CKTOTAL, CKMB, CKMBINDEX, TROPONINI in the last 168 hours. BNP (last 3 results) No results for input(s): PROBNP in the last 8760 hours. CBG: Recent Labs  Lab 02/16/18 0100  GLUCAP 144*   D-Dimer: No results for input(s): DDIMER in the last 72 hours. Hgb A1c: No results for input(s): HGBA1C in the last 72 hours. Lipid Profile: No results for input(s): CHOL, HDL, LDLCALC, TRIG, CHOLHDL, LDLDIRECT in the last 72 hours. Thyroid function studies: No results for input(s): TSH, T4TOTAL, T3FREE, THYROIDAB in the last 72 hours.  Invalid input(s): FREET3 Anemia work up: No results for input(s): VITAMINB12, FOLATE, FERRITIN, TIBC, IRON, RETICCTPCT in the last 72 hours. Sepsis Labs: Recent Labs  Lab 02/15/18 1414 02/15/18 1427 02/16/18 0526  WBC 9.3  --  6.4  LATICACIDVEN  --  1.01  --    Microbiology No results found for this or any previous visit (from the past 240 hour(s)).   Medications:   . apixaban  5 mg Oral BID  . dexamethasone  2 mg Oral BID  . feeding supplement (ENSURE ENLIVE)  237 mL Oral BID BM  . insulin aspart  0-9 Units Subcutaneous TID WC  . pantoprazole  40 mg Oral BID  . senna-docusate  1 tablet Oral QHS   Continuous Infusions: . sodium chloride 75 mL/hr at 02/15/18 2318      LOS: 0 days   Charlynne Cousins  [facility] Pager [phone]  *Please refer to [email or web address], password [email or web address] to get updated schedule on who will round on this patient, as hospitalists switch teams weekly. If 7PM-7AM,  please contact night-coverage at [email or web address], password [email or web address] for any overnight needs.  02/16/2018, 8:07 AM

## 2018-02-16 NOTE — Progress Notes (Signed)
Pt refused bed alarm. Pt educated on the importance of calling for help when needing to get out of bed. Will monitor pt closely.

## 2018-02-16 NOTE — Evaluation (Signed)
Physical Therapy Evaluation Patient Details Name: Jesse Avila MRN: [id number] DOB: 04-15-1951 Today's Date: 02/16/2018   History of Present Illness  Jesse Avila is a 67 y.o. male with 3 of atrial fibrillation, diabetes mellitus, anemia and thrombocytopenia likely related to chemotherapy with history of stage IV small cell lung cancer who had recently around 3 days ago had received brain radiation therapy admitted with weakness and 5 falls last few days.  Clinical Impression  Patient presents with decreased independence with mobility with recent falls at home.  Today still mild imbalance, but no overt LOB with ambulation.  Was initially orthostatic in standing, but improved standing longer.  Feel he will need follow up HHPT at d/c to address deficits and maximize mobility to allow return to independent.    Orthostatic VS for the past 24 hrs (Last 3 readings):  BP- Lying Pulse- Lying BP- Sitting Pulse- Sitting BP- Standing at 0 minutes Pulse- Standing at 0 minutes BP- Standing at 3 minutes Pulse- Standing at 3 minutes  02/16/18 1300 127/71 73 115/70 70 (!) 87/62 83 113/69 90      Follow Up Recommendations Home health PT;Supervision/Assistance - 24 hour    Equipment Recommendations  None recommended by PT    Recommendations for Other Services       Precautions / Restrictions Precautions Precautions: Fall      Mobility  Bed Mobility Overal bed mobility: Needs Assistance Bed Mobility: Supine to Sit     Supine to sit: Supervision;HOB elevated     General bed mobility comments: assist for lines/safety  Transfers Overall transfer level: Needs assistance Equipment used: Rolling walker (2 wheeled) Transfers: Sit to/from Stand Sit to Stand: Min guard         General transfer comment: for balance  Ambulation/Gait Ambulation/Gait assistance: Min assist;Min guard Gait Distance (Feet): 140 Feet Assistive device: Rolling walker (2 wheeled) Gait Pattern/deviations:  Step-through pattern;Decreased stride length     General Gait Details: no LOB, but min A given due to recent h/o falls and BP drop in initial standing  Stairs            Wheelchair Mobility    Modified Rankin (Stroke Patients Only)       Balance Overall balance assessment: Needs assistance;History of Falls   Sitting balance-Leahy Scale: Good     Standing balance support: Bilateral upper extremity supported Standing balance-Leahy Scale: Poor Standing balance comment: UE support in standing                             Pertinent Vitals/Pain Pain Assessment: 0-10 Pain Score: 7  Pain Location: back Pain Descriptors / Indicators: Aching;Sore Pain Intervention(s): Monitored during session;Repositioned    Home Living Family/patient expects to be discharged to:: Private residence Living Arrangements: Spouse/significant other Available Help at Discharge: Family Type of Home: House Home Access: Stairs to enter Entrance Stairs-Rails: None Entrance Stairs-Number of Steps: 3 Home Layout: One level Home Equipment: Environmental consultant - 2 wheels;Bedside commode;Cane - single point      Prior Function Level of Independence: Independent with assistive device(s)               Hand Dominance        Extremity/Trunk Assessment   Upper Extremity Assessment Upper Extremity Assessment: Generalized weakness(bruising along both UE's)    Lower Extremity Assessment Lower Extremity Assessment: Generalized weakness(bruise R knee, small scrap L shin)       Communication   Communication:  No difficulties  Cognition Arousal/Alertness: Awake/alert Behavior During Therapy: WFL for tasks assessed/performed Overall Cognitive Status: Within Functional Limits for tasks assessed                                        General Comments      Exercises     Assessment/Plan    PT Assessment Patient needs continued PT services  PT Problem List Decreased  strength;Decreased mobility;Decreased balance;Decreased knowledge of use of DME;Decreased safety awareness       PT Treatment Interventions DME instruction;Therapeutic activities;Gait training;Therapeutic exercise;Patient/family education;Stair training;Functional mobility training;Balance training    PT Goals (Current goals can be found in the Care Plan section)  Acute Rehab PT Goals Patient Stated Goal: to go home, start enjoying what life he has to live PT Goal Formulation: With patient Time For Goal Achievement: 02/23/18 Potential to Achieve Goals: Good    Frequency Min 3X/week   Barriers to discharge        Co-evaluation               AM-PAC PT "6 Clicks" Daily Activity  Outcome Measure Difficulty turning over in bed (including adjusting bedclothes, sheets and blankets)?: A Little Difficulty moving from lying on back to sitting on the side of the bed? : A Little Difficulty sitting down on and standing up from a chair with arms (e.g., wheelchair, bedside commode, etc,.)?: Unable Help needed moving to and from a bed to chair (including a wheelchair)?: A Little Help needed walking in hospital room?: A Little Help needed climbing 3-5 steps with a railing? : A Little 6 Click Score: 16    End of Session Equipment Utilized During Treatment: Gait belt Activity Tolerance: Patient limited by fatigue Patient left: in chair;with call bell/phone within reach   PT Visit Diagnosis: Other abnormalities of gait and mobility (R26.89);Muscle weakness (generalized) (M62.81)    Time: 2122-4825 PT Time Calculation (min) (ACUTE ONLY): 22 min   Charges:   PT Evaluation $PT Eval Moderate Complexity: 1 Mod     PT G CodesMagda Kiel, [place] [phone] 02/16/2018   Reginia Naas 02/16/2018, 2:46 PM

## 2018-02-16 NOTE — Progress Notes (Signed)
Initial Nutrition Assessment  DOCUMENTATION CODES:   Underweight, Severe malnutrition in context of chronic illness  INTERVENTION:    Boost Breeze po once/daily, each supplement provides 250 kcal and 9 grams of protein  Magic cup TID with meals, each supplement provides 290 kcal and 9 grams of protein  NUTRITION DIAGNOSIS:   Severe Malnutrition related to chronic illness, cancer and cancer related treatments as evidenced by percent weight loss, energy intake < or equal to 75% for > or equal to 1 month, severe fat depletion, severe muscle depletion  GOAL:   Patient will meet greater than or equal to 90% of their needs  MONITOR:   PO intake, Supplement acceptance, Weight trends, Labs  REASON FOR ASSESSMENT:   Malnutrition Screening Tool    ASSESSMENT:   Patient with PMH significant for DM, HTN, PVD, stroke, and stage IV small cell lung cancer currently undergoing radiation. Presents this admission with failure to thrive and generalized weakness.    Pt reports decreased intake for 2-3 months due to loss of appetite associated with chemotherapy. States he would eat three times/day but food options would consist of apple sauce, chicken noodle soup, and salads. He reports having to eat softer foods due to poor dentition and difficulties swallowing. RD provided pt with Boost Breeze to try and he complained of it getting stuck in his esophagus. Pt states he had issues with this before and was provided pink fluid to help with swallowing (denies this is magic mouthwash or nystatin). RN made aware and SLP has been consulted. Pt has been prescribed Marinol but does not think it has made an impact on his appetite. Milky ensure/boost makes him vomit. Pt has seen the [place] RD. This RD reminded pt of goals and discussed possible supplement options he could use at home.   Pt endorses a UBW of 160-162 lb. Records indicate pt weighed 179 lb 09/28/17 and 140 lb this admission (21.7% wt loss in  5 months, significant for time frame). Nutrition-Focused physical exam completed.   Medications reviewed.  Labs reviewed: Na 133 (L) K 3.3 (L)   NUTRITION - FOCUSED PHYSICAL EXAM:    Most Recent Value  Orbital Region  Moderate depletion  Upper Arm Region  Severe depletion  Thoracic and Lumbar Region  Unable to assess  Buccal Region  Severe depletion  Temple Region  Severe depletion  Clavicle Bone Region  Severe depletion  Clavicle and Acromion Bone Region  Severe depletion  Scapular Bone Region  Unable to assess  Dorsal Hand  Moderate depletion  Patellar Region  Severe depletion  Anterior Thigh Region  Severe depletion  Posterior Calf Region  Severe depletion  Edema (RD Assessment)  None  Hair  Reviewed  Eyes  Reviewed  Mouth  Reviewed  Skin  Reviewed  Nails  Reviewed     Diet Order:   Diet Order           Diet heart healthy/carb modified Room service appropriate? Yes; Fluid consistency: Thin  Diet effective now          EDUCATION NEEDS:   Education needs have been addressed  Skin:  Skin Assessment: Skin Integrity Issues: Skin Integrity Issues:: Other (Comment) Other: open wound left arm  Last BM:  02/12/18  Height:   Ht Readings from Last 1 Encounters:  02/15/18 6\' 1"  (1.854 m)    Weight:   Wt Readings from Last 1 Encounters:  02/15/18 140 lb 3.4 oz (63.6 kg)    Ideal Body Weight:  83.6 kg  BMI:  Body mass index is 18.5 kg/m.  Estimated Nutritional Needs:   Kcal:  2150-2350 kcal  Protein:  105-115 grams  Fluid:  >2.1 L/day    Mariana Single RD, LDN Clinical Nutrition Pager # - [phone]

## 2018-02-17 ENCOUNTER — Other Ambulatory Visit: Payer: BLUE CROSS/BLUE SHIELD

## 2018-02-17 ENCOUNTER — Ambulatory Visit: Payer: BLUE CROSS/BLUE SHIELD

## 2018-02-17 ENCOUNTER — Ambulatory Visit
Admission: RE | Admit: 2018-02-17 | Discharge: 2018-02-17 | Disposition: A | Discharge status: Home / Self Care | Payer: BLUE CROSS/BLUE SHIELD | Source: Ambulatory Visit | Attending: Radiation Oncology | Admitting: Radiation Oncology

## 2018-02-17 ENCOUNTER — Ambulatory Visit: Payer: BLUE CROSS/BLUE SHIELD | Admitting: Oncology

## 2018-02-17 DIAGNOSIS — E43 Unspecified severe protein-calorie malnutrition: Secondary | ICD-10-CM

## 2018-02-17 DIAGNOSIS — K5900 Constipation, unspecified: Secondary | ICD-10-CM

## 2018-02-17 DIAGNOSIS — I4891 Unspecified atrial fibrillation: Secondary | ICD-10-CM

## 2018-02-17 DIAGNOSIS — N184 Chronic kidney disease, stage 4 (severe): Secondary | ICD-10-CM

## 2018-02-17 DIAGNOSIS — Z7189 Other specified counseling: Secondary | ICD-10-CM

## 2018-02-17 DIAGNOSIS — Z66 Do not resuscitate: Secondary | ICD-10-CM

## 2018-02-17 DIAGNOSIS — Z85841 Personal history of malignant neoplasm of brain: Secondary | ICD-10-CM

## 2018-02-17 DIAGNOSIS — C3492 Malignant neoplasm of unspecified part of left bronchus or lung: Secondary | ICD-10-CM

## 2018-02-17 DIAGNOSIS — R531 Weakness: Secondary | ICD-10-CM

## 2018-02-17 DIAGNOSIS — Z515 Encounter for palliative care: Secondary | ICD-10-CM

## 2018-02-17 DIAGNOSIS — I10 Essential (primary) hypertension: Secondary | ICD-10-CM

## 2018-02-17 DIAGNOSIS — Z85118 Personal history of other malignant neoplasm of bronchus and lung: Secondary | ICD-10-CM

## 2018-02-17 DIAGNOSIS — E860 Dehydration: Secondary | ICD-10-CM

## 2018-02-17 DIAGNOSIS — R627 Adult failure to thrive: Secondary | ICD-10-CM

## 2018-02-17 LAB — CBC WITH DIFFERENTIAL/PLATELET
Basophils Absolute: 0 10*3/uL (ref 0.0–0.1)
Basophils Relative: 0 %
EOS ABS: 0 10*3/uL (ref 0.0–0.7)
EOS PCT: 0 %
HCT: 27.8 % — ABNORMAL LOW (ref 39.0–52.0)
HEMOGLOBIN: 9.7 g/dL — AB (ref 13.0–17.0)
LYMPHS ABS: 0.7 10*3/uL (ref 0.7–4.0)
Lymphocytes Relative: 10 %
MCH: 35.3 pg — AB (ref 26.0–34.0)
MCHC: 34.9 g/dL (ref 30.0–36.0)
MCV: 101.1 fL — ABNORMAL HIGH (ref 78.0–100.0)
MONO ABS: 0.5 10*3/uL (ref 0.1–1.0)
MONOS PCT: 7 %
Neutro Abs: 5.9 10*3/uL (ref 1.7–7.7)
Neutrophils Relative %: 83 %
PLATELETS: 67 10*3/uL — AB (ref 150–400)
RBC: 2.75 MIL/uL — ABNORMAL LOW (ref 4.22–5.81)
RDW: 15.5 % (ref 11.5–15.5)
WBC: 7 10*3/uL (ref 4.0–10.5)

## 2018-02-17 LAB — BASIC METABOLIC PANEL
Anion gap: 7 (ref 5–15)
BUN: 29 mg/dL — ABNORMAL HIGH (ref 8–23)
CALCIUM: 8.6 mg/dL — AB (ref 8.9–10.3)
CO2: 23 mmol/L (ref 22–32)
CREATININE: 1.65 mg/dL — AB (ref 0.61–1.24)
Chloride: 104 mmol/L (ref 98–111)
GFR calc Af Amer: 49 mL/min — ABNORMAL LOW (ref >60)
GFR calc non Af Amer: 42 mL/min — ABNORMAL LOW (ref >60)
GLUCOSE: 130 mg/dL — AB (ref 70–99)
Potassium: 3.3 mmol/L — ABNORMAL LOW (ref 3.5–5.1)
Sodium: 134 mmol/L — ABNORMAL LOW (ref 135–145)

## 2018-02-17 LAB — GLUCOSE, CAPILLARY
GLUCOSE-CAPILLARY: 139 mg/dL — AB (ref 70–99)
Glucose-Capillary: 108 mg/dL — ABNORMAL HIGH (ref 70–99)
Glucose-Capillary: 154 mg/dL — ABNORMAL HIGH (ref 70–99)
Glucose-Capillary: 140 mg/dL — ABNORMAL HIGH (ref 70–99)

## 2018-02-17 MED ORDER — SODIUM CHLORIDE 0.9 % IV SOLN
INTRAVENOUS | Status: AC
  Administered 2018-02-17 (×2): via INTRAVENOUS

## 2018-02-17 MED ORDER — SODIUM CHLORIDE 0.9 % IV SOLN
INTRAVENOUS | Status: AC
  Administered 2018-02-17: 11:00:00 via INTRAVENOUS

## 2018-02-17 MED ORDER — POTASSIUM CHLORIDE CRYS ER 20 MEQ PO TBCR
40.0000 meq | EXTENDED_RELEASE_TABLET | Freq: Two times a day (BID) | ORAL | Status: AC
  Administered 2018-02-17 (×2): 40 meq via ORAL
  Filled 2018-02-17 (×2): qty 2

## 2018-02-17 NOTE — Progress Notes (Signed)
PT Cancellation Note  Patient Details Name: Jesse Avila MRN: [id number] DOB: 05/02/1951   Cancelled Treatment:     attempted to see three times. 1. Getting a bath 2. MD in room 3. Getting meds then going to Radiation  Pt has been evaluated with rec for [facility]  PTA [facility]  Acute  Rehab Pager      [phone]

## 2018-02-17 NOTE — Progress Notes (Signed)
I spoke with the patient by phone. He had been hesitant to consider XRT to the peripancreatic region. We discussed possible side effects and possible benefits/risks associated. He would like to try treatment, but may consider hospice care following this if he does not have improvement in his symptoms.     Carola Rhine, PAC

## 2018-02-17 NOTE — Consult Note (Signed)
Consultation Note Date: 02/17/2018   Patient Name: Jesse Avila  DOB: 12/13/1950  MRN: [id number]  Age / Sex: 67 y.o., male  PCP: [name], MD Referring Physician: Aileen Fass, Tammi Klippel, MD  Reason for Consultation: Establishing goals of care  HPI/Patient Profile: 67 y.o. male admitted on 02/15/2018 from home with complaints of worsening weakness, multiple falls, and inability to ambulate. 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 He recently completed brain radiation 3 days prior to admission. He reports having abdominal discomfort with poor appetite, nausea, and vomiting. In ED CT head and C-spine did not show any acute findings. X-ray showed constipation with stool burden. He complained of lower chronic back pain. He is followed outpatient by Dr. Julien Nordmann (Oncology). Since admission he has received IV fluids and bowel regimen. Palliative Medicine team consulted for goals of care discussion.   Clinical Assessment and Goals of Care: I have reviewed medical records including lab results, imaging, Epic notes, and MAR, received report from the bedside RN, and assessed the patient. I then met at the bedside with patient and his wife, Jesse Avila  to discuss diagnosis prognosis, GOC, EOL wishes, disposition and options. Patient is alert and oriented x3. He was able to engage appropriately in goals of care discussion.   I introduced Palliative Medicine as specialized medical care for people living with serious illness. It focuses on providing relief from the symptoms and stress of a serious illness. The goal is to improve quality of life for both the patient and the family.  We discussed a brief life review of the patient. 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 He has been married to his wife for 40 years and they have one daughter. He is a man of [place]. He states his 3 grandchildren mean the world to him outside of the beach and fishing.   As far as functional and nutritional status. Wife states he has continued to decline in health over the past month. She reports he was experiencing some weakness and decreased appetite over the past year with chemo, however, his appetite had picked up and he regained some strength with the help of steroids and Marinol. Unfortunately wife states those medications seemed to no longer work and the past month he has been sleeping more, complaining of increased weakness, fatigue, and had little to no appetite. She feels he has loss some weight but uncertain of the actual amount. He recently completed brain radiation due to a newly found area, and since he began falling, not eating much, and prior to admission he could not even walk by himself. He states prior he was ambulatory with a walker and could perform ADLs with assistance due to fatigue.   We discussed his current illness and what it means in the larger context of his on-going co-morbidities.  Natural disease trajectory and expectations at  EOL were discussed. Mr. Heikes became tearful during conversation. He stated "I know that I don't have much longer. I know I am going to die. I don't want to but I am!" Wife became tearful as well.   I attempted to elicit values and goals of care important to the patient.    The difference between aggressive medical intervention and comfort care was considered in light of the patient's goals of care. He states he is supposed to start radiation for 10 treatments to pancreatic area. He wants to complete the treatment but states he worry that his quality of life will continue to decline and his last days left will be spent suffering due to symptoms. We discussed that palliative  radiation is generally performed with hope that it would decrease symptoms such as feeling of fullness, nausea, and pain. He is aware that some side effects may occur. He became tearful again and states "if it is as bad as previous treatments, I don't want to continue like that. All I want is to feel a little better and regain a little strength so that I can take a vacation with my daughter and grandchildren before I die. I would love to go kayaking or tubing" We discussed his wishes. He mentioned he loved the beach. We discussed maybe a 1-2 day trip to the beach with his family may would be more obtainable than others which requires more energy, activity, and stamina. He laughed and agreed. Time in their favorite place is all that is important to him. He and his wife would like to speak with radiation regarding his treatment plan and review expectations. Support was given and advised I would reach out to them to have someone contact him for further discussion.  He wants to return to his home with home health services while completing radiation.   Advanced directives, concepts specific to code status, artifical feeding and hydration, and rehospitalization were considered and discussed. Patient verbalized that he would not like any forms of life-sustaining measures.   Hospice and Palliative Care services outpatient were explained and offered. Both patient and wife verbalized understanding of the differences between hospice and palliative care services. At this time they would like to have Palliative services outpatient. They are aware that with outpatient PT he is unable to have hospice services. They verbalized understanding and awareness that once they are prepared to transition to hospice services they can make their outpatient Palliative team aware or their Oncology care team and this can easily be arranged.   Questions and concerns were addressed.  Hard Choices booklet left for review. The family was  encouraged to call with questions or concerns.  PMT will continue to support holistically.  Primary Decision Maker: Patient is capable of making medical decisions NEXT OF Markcus Lazenby, wife    SUMMARY OF RECOMMENDATIONS    DNR/DNI-confirmed by patient and spouse. DNR out of facility form completed and also MOST form completed.   Continue to treat the treatable while hospitalized without escalation of care. Patient would like to return home with current plan to HHRN/HHPT and outpatient Palliative services. He and wife are aware they can transition to hospice services at any point and Palliative will be able to assist with transition.   Reports pain and nausea is currently well controlled on current regimen.   Case Management consult for outpatient Palliative.   Patient and wife had questions regarding radiation therapy to pancreas, side effects, and expectations. Spoke with Ebony Hail, [place]. She advised she  would have a conversation with patient today prior to initial treatment this afternoon.   Palliative team will continue to support patient, family, and medical team during hospitalization.   Code Status/Advance Care Planning: DNR Sherin Quarry confirmed by patient and wife  Palliative Prophylaxis:   Bowel Regimen and Frequent Pain Assessment  Additional Recommendations (Limitations, Scope, Preferences):  Full Scope Treatment-continue to treat the treatable without escalation in care.   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  Discharge Planning: Home with Home Health and outpatient palliative services.      Primary Diagnoses: Present  on Admission: . Small cell lung carcinoma, left ([place]) . PAD (peripheral artery disease) ([place]) . Essential hypertension, benign . Atrial fibrillation ([place]) . CKD (chronic kidney disease) stage 4, GFR 15-29 ml/min (HCC) . Hypokalemia . Orthostatic hypotension   I have reviewed the medical record, interviewed the patient and family, and examined the patient. The following aspects are pertinent.  Past Medical History:  Diagnosis Date  . Atrial fibrillation ([place])   . Carotid artery occlusion   . DJD (degenerative joint disease) of knee   . Essential hypertension, benign    takes Benicar daily  . Goals of care, counseling/discussion 09/18/2016  . History of colon polyps    benign  . History of gout   . History of radiation therapy 09/30/16-11/10/16   left lung 60 Gy in 30 fractions  . Hyperlipidemia    takes Fenofibrate and Crestor daily  . Insomnia    takes Ambien nightly as needed  . Joint pain   . Lung cancer ([place])   . Nocturia   . Peripheral vascular disease ([place])   . Primary localized osteoarthritis of left knee 12/26/2014  . Stroke [facility]) 2015?   takes Plavix daily as well as Pletal,not on plavix at present 05/15/15  . 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 Marital status: Married    Spouse name: Not on file  . Number of children: Not on file  . Years of education: Not on file  . Highest education level: Not on file  Occupational History  . Not on file  Social Needs  . Financial resource strain: Not on file  . Food insecurity:    Worry: Not on file    Inability: Not on file  . Transportation needs:    Medical: Not on file    Non-medical: Not on file  Tobacco Use  . Smoking status: Former Smoker    Packs/day: 1.00    Years: 20.00    Pack years: 20.00    Types: Cigarettes  . Smokeless tobacco: Never Used    . Tobacco comment: quit smoking 2008  Substance and Sexual Activity  . Alcohol use: Yes    Alcohol/week: 0.0 oz    Comment: beer- occasionally  . Drug use: No  . Sexual activity: Never  Lifestyle  . Physical activity:    Days per week: Not on file    Minutes per session: Not on file  . Stress: Not on file  Relationships  . Social connections:  Talks on phone: Not on file    Gets together: Not on file    Attends religious service: Not on file    Active member of club or organization: Not on file    Attends meetings of clubs or organizations: Not on file    Relationship status: Not on file  Other Topics Concern  . Not on file  Social History Narrative  . Not on file   Family History  Problem Relation Age of Onset  . Diabetes Mother   . Hypertension Mother   . Heart disease Mother        Coronary Artery Bypass Graft  . Hyperlipidemia Mother   . Heart attack Mother   . Hypertension Father   . Hyperlipidemia Sister   . Stroke Sister    Scheduled Meds: . dexamethasone  2 mg Oral BID  . feeding supplement  1 Container Oral Q24H  . insulin aspart  0-9 Units Subcutaneous TID WC  . pantoprazole  40 mg Oral BID  . polyethylene glycol  17 g Oral BID  . potassium chloride  40 mEq Oral BID  . senna-docusate  1 tablet Oral QHS   Continuous Infusions: . sodium chloride 100 mL/hr at 02/17/18 1127   PRN Meds:.acetaminophen **OR** acetaminophen, HYDROmorphone (DILAUDID) injection, lactulose, LORazepam, ondansetron **OR** ondansetron (ZOFRAN) IV, sodium chloride flush, zolpidem Medications Prior to Admission:  Prior to Admission medications   Medication Sig Start Date End Date Taking? Authorizing Provider  dexamethasone (DECADRON) 2 MG tablet Take 1 tablet (2 mg total) by mouth 2 (two) times daily. 02/08/18  Yes Hayden Pedro, PA-C  ELIQUIS 5 MG TABS tablet Take 5 mg by mouth 2 (two) times daily. 01/12/18  Yes [provider]  furosemide (LASIX) 40 MG tablet TAKE  1 TABLET BY MOUTH TWICE A DAY 09/09/17  Yes Gollan, Kathlene November, MD  HYDROcodone-acetaminophen (NORCO) 5-325 MG tablet Take 1 tablet by mouth every 6 (six) hours as needed for moderate pain. 01/27/18  Yes Curcio, Roselie Awkward, NP  insulin glargine (LANTUS) 100 UNIT/ML injection Inject 0.1 mLs (10 Units total) into the skin daily. Patient taking differently: Inject 15 Units into the skin at bedtime.  03/11/17  Yes Regalado, Belkys A, MD  KLOR-CON M20 20 MEQ tablet TAKE 1/2 TABLET (10MEQ) BY MOUTH DAILY. Patient taking differently: TAKE 1 TABLET (20MEQ) BY MOUTH DAILY. 02/07/18  Yes Curt Bears, MD  lactulose (CHRONULAC) 10 GM/15ML solution Take 30 mLs (20 g total) by mouth daily as needed for mild constipation or moderate constipation. 12/16/17  Yes Curcio, Roselie Awkward, NP  lidocaine-prilocaine (EMLA) cream Apply 1 application topically as needed. 11/25/17  Yes Curcio, Roselie Awkward, NP  LORazepam (ATIVAN) 1 MG tablet Take 1 mg by mouth 2 (two) times daily as needed for anxiety.  01/26/18  Yes [provider]  ondansetron (ZOFRAN) 8 MG tablet Take 1 tablet (8 mg total) by mouth every 8 (eight) hours as needed for nausea or vomiting. 02/11/18  Yes Curt Bears, MD  pantoprazole (PROTONIX) 40 MG tablet Take 1 tablet (40 mg total) by mouth 2 (two) times daily for 15 days. Patient taking differently: Take 40 mg by mouth 2 (two) times daily as needed (reflux).  11/13/17 02/15/18 Yes Hosie Poisson, MD  prochlorperazine (COMPAZINE) 10 MG tablet TAKE 1 TABLET (10 MG TOTAL) BY MOUTH EVERY 6 (SIX) HOURS AS NEEDED FOR NAUSEA OR VOMITING. 02/10/18  Yes Curt Bears, MD  rosuvastatin (CRESTOR) 40 MG tablet Take 40 mg by mouth daily. 02/10/18  Yes [provider]  spironolactone (ALDACTONE) 25 MG tablet Take 1 tablet (25 mg total) by mouth daily. 03/11/17  Yes Regalado, Belkys A, MD  zolpidem (AMBIEN) 10 MG tablet Take 10 mg by mouth at bedtime as needed for sleep.  01/26/15  Yes [provider]    amiodarone (PACERONE) 200 MG tablet TAKE 1 TABLET BY MOUTH EVERY DAY Patient not taking: Reported on 02/07/2018 02/01/18   Minna Merritts, MD  dronabinol (MARINOL) 2.5 MG capsule Take 1 capsule (2.5 mg total) by mouth 2 (two) times daily before a meal. Patient not taking: Reported on 02/07/2018 11/18/17   Maryanna Shape, NP  lidocaine (XYLOCAINE) 2 % solution Use as directed 15 mLs in the mouth or throat 3 (three) times daily before meals. Patient not taking: Reported on 02/07/2018 11/13/17   Hosie Poisson, MD  potassium chloride SA (K-DUR,KLOR-CON) 20 MEQ tablet Take 1 tablet (20 mEq total) by mouth 2 (two) times daily. Patient not taking: Reported on 02/15/2018 01/27/18   Maryanna Shape, NP  sucralfate (CARAFATE) 1 GM/10ML suspension Take 10 mLs (1 g total) by mouth 4 (four) times daily - after meals and at bedtime. Patient not taking: Reported on 01/28/2018 11/13/17   Hosie Poisson, MD   Allergies  Allergen Reactions  . Cialis [Tadalafil] Other (See Comments)    Headache    Review of Systems  Constitutional: Positive for appetite change, fatigue and unexpected weight change.  Musculoskeletal: Positive for back pain.  Neurological: Positive for weakness.    Physical Exam  Constitutional: He is oriented to person, place, and time. He appears cachectic. He is cooperative. He has a sickly appearance.  Frail appearance   Cardiovascular: Normal rate, normal heart sounds and normal pulses. An irregular rhythm present.  Pulmonary/Chest: Effort normal. He has decreased breath sounds.  Abdominal: Normal appearance and bowel sounds are normal. There is tenderness.  Musculoskeletal:  Generalized weakness   Neurological: He is alert and oriented to person, place, and time.  Skin: Skin is warm and dry.  Some scattered bruising  Psychiatric: He has a normal mood and affect. His speech is normal and behavior is normal. Judgment and thought content normal. Cognition and memory are normal.  Nursing  note and vitals reviewed.   Vital Signs: BP (!) 142/85 (BP Location: Right Arm)   Pulse 71   Temp 97.7 F (36.5 C)   Resp 18   Ht '6\' 1"'$  (1.854 m)   Wt 64.3 kg (141 lb 11.2 oz)   SpO2 99%   BMI 18.70 kg/m  Pain Scale: 0-10   Pain Score: 8    SpO2: SpO2: 99 % O2 Device:SpO2: 99 % O2 Flow Rate: .   IO: Intake/output summary:   Intake/Output Summary (Last 24 hours) at 02/17/2018 1401 Last data filed at 02/17/2018 0900 Gross per 24 hour  Intake 1560 ml  Output -  Net 1560 ml    LBM: Last BM Date: 02/12/18 Baseline Weight: Weight: 63.6 kg (140 lb 3.4 oz) Most recent weight: Weight: 64.3 kg (141 lb 11.2 oz)     Palliative Assessment/Data: PPS 40% with poor po intake    Time In: 1130 Time Out: 1245 Time Total: 75 min.   Greater than 50%  of this time was spent counseling and coordinating care related to the above assessment and plan.  Signed by:  Alda Lea, NP-BC Palliative Medicine Team  Phone: [phone] Fax: [phone] Pager: [phone] Amion: Bjorn Pippin    Please contact Palliative Medicine  Team phone at [phone] for questions and concerns.  For individual provider: See Shea Evans

## 2018-02-17 NOTE — Progress Notes (Signed)
[facility] PROGRESS NOTE    Progress Note  Jesse Avila  VQM:[id number] DOB: 1951-01-12 DOA: 02/15/2018 PCP: [name], MD     Brief Narrative:   Jesse Avila is an 67 y.o. 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 Continue Decadron, PMT has been  consulted, discontinue Lasix and Aldactone. Blood pressure has been stable off Lasix and Aldactone, he will probably go home without these.  Chronic atrial fibrillation: Rate controlled, continue amiodarone, continue to hold Eliquis.  Diabetes mellitus type 2: Continue Lantus plus sliding scale insulin, with good control.  Antineoplastic chemotherapy-induced anemia and thrombocytopenia likely due to chemotherapy: CBC is pending today  Essential hypertension, benign Blood pressure is significantly improved. We will discontinue diuretic therapy. Will need to follow-up with PCP as an outpatient.  Electrolyte imbalance/hypokalemia and hyponatremia: Replete orally, KVO IV fluids.  Possible AKI on Chronic renal disease stage III: On admission it was 2.7.  Likely prerenal in etiology improved to baseline to 1.6 with IV fluids.  Small cell lung carcinoma, left ([place]): He would like to discuss with his oncologist his chemotherapy. He will go down today for radiation. Awaiting hospice and palliative care recommendations.  Constipation: Had multiple bowel movements feels much better.  Given a single dose of MiraLAX  today.  DVT prophylaxis: SCD Family Communication:none Disposition Plan/Barrier to D/C: Hopefully home in the morning. Code Status:     Code Status Orders  (From admission, onward)        Start     Ordered   02/15/18 2252  Do not attempt resuscitation (DNR)  Continuous    Question Answer Comment  In the event of cardiac or respiratory ARREST Do not call a "code blue"   In the event of cardiac or respiratory ARREST Do not perform Intubation, CPR, defibrillation or ACLS   In the event of cardiac or respiratory ARREST Use medication by any route, position, wound care, and other measures to relive pain and suffering. May use oxygen, suction and manual treatment of airway obstruction as needed for comfort.      02/15/18 2253    Code Status History    Date Active Date Inactive Code Status Order ID Comments User Context   11/09/2017 2000 11/13/2017 1801 Full Code [id number]  Norval Morton, MD ED   03/01/2017 0459 03/10/2017 1844 Full Code [id number]  Etta Quill, DO ED   02/02/2017 1756 02/05/2017 2012 Full Code [id number]  Reubin Milan, MD Inpatient   10/21/2016 2204 10/22/2016 2127 Full Code [id number]  Rama, Venetia Maxon, MD Inpatient   05/17/2015 2210 05/20/2015 1649 Full Code [id number]  Gabriel Earing, PA-C Inpatient   03/23/2015 1434 03/23/2015 2235 Full Code [id number]  Angelia Mould, MD Inpatient   12/26/2014 1409 12/27/2014 1619 Full Code [id number]  Marchia Bond, MD Inpatient   05/09/2014 1424 05/10/2014 1322 Full Code [id number]  Orbie Hurst Inpatient   04/14/2014 1703 04/16/2014 1759 Full Code [id number]  Louellen Molder, MD Inpatient        IV Access:  Peripheral IV   Procedures and diagnostic studies:   Ct Head Wo Contrast  Result Date: 02/15/2018 CLINICAL DATA:  Minor head trauma, on anti coagulation, weakness, not able to either walk since Friday after radiation treatment to the brain, cervical spine trauma, high clinical risk, history lung cancer  post radiation therapy, atrial fibrillation, carotid artery disease, hypertension, type II diabetes mellitus EXAM: CT HEAD WITHOUT CONTRAST CT CERVICAL SPINE WITHOUT CONTRAST TECHNIQUE: Multidetector CT imaging of the head and cervical spine was performed following the standard protocol without intravenous contrast. Multiplanar CT image reconstructions of the cervical spine were also generated. COMPARISON:  MR head 02/06/2018 FINDINGS: CT HEAD FINDINGS Brain: Generalized atrophy. Normal ventricular morphology. No midline shift or mass effect. Small vessel chronic ischemic changes of deep cerebral white matter. Deep RIGHT frontal periventricular mass 13 x 14 x 14 mm unchanged. 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 No intracranial hemorrhage, mass lesion, or evidence of acute infarction. No extra-axial fluid collections. Vascular: Atherosclerotic calcifications of internal carotid and vertebral arteries at skull base Skull: Intact Sinuses/Orbits: Visualized paranasal sinuses and mastoid air cells clear Other: N/A CT CERVICAL SPINE FINDINGS Alignment: Normal Skull base and vertebrae: Mild osseous demineralization. Visualized skull base intact. Scattered facet degenerative changes bilaterally. Vertebral body heights maintained without fracture or bone destruction. Disc space narrowing C6-C7. Question ununited ossification center versus sequela of remote trauma at tip of spinous process of T1. Soft tissues and spinal canal: Prevertebral soft tissues normal thickness. Disc levels:  No additional abnormalities Upper chest: LEFT upper lobe lung scarring.  RIGHT apex clear. Other: Extensive atherosclerotic calcifications in the LEFT carotid system. IMPRESSION: RIGHT periventricular mass lesion likely metastasis in patient with history of lung cancer, grossly stable in size at 14 x 13 x 13 mm. Atrophy with small vessel chronic ischemic changes of deep cerebral white matter. No acute intracranial abnormalities.  Degenerative disc and facet disease changes of the cervical spine. No acute cervical spine abnormalities. Carotid vascular disease. Electronically Signed   By: Lavonia Dana M.D.   On: 02/15/2018 15:37   Ct Cervical Spine Wo Contrast  Result Date: 02/15/2018 CLINICAL DATA:  Minor head trauma, on anti coagulation, weakness, not able to either walk since Friday after radiation treatment to the brain, cervical spine trauma, high clinical risk, history lung cancer post radiation therapy, atrial fibrillation, carotid artery disease, hypertension, type II diabetes mellitus EXAM: CT HEAD WITHOUT CONTRAST CT CERVICAL SPINE WITHOUT CONTRAST TECHNIQUE: Multidetector CT imaging of the head and cervical spine was performed following the standard protocol without intravenous contrast. Multiplanar CT image reconstructions of the cervical spine were also generated. COMPARISON:  MR head 02/06/2018 FINDINGS: CT HEAD FINDINGS Brain: Generalized atrophy. Normal ventricular morphology. No midline shift or mass effect. Small vessel chronic ischemic changes of deep cerebral white matter. Deep RIGHT frontal periventricular mass 13 x 14 x 14 mm unchanged. 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 No intracranial hemorrhage, mass lesion, or evidence of acute infarction. No extra-axial fluid collections. Vascular: Atherosclerotic calcifications of internal carotid and vertebral arteries at skull base Skull: Intact Sinuses/Orbits: Visualized paranasal sinuses and mastoid air cells clear Other: N/A CT CERVICAL SPINE FINDINGS Alignment: Normal Skull base and vertebrae: Mild osseous demineralization. Visualized skull base intact. Scattered facet degenerative changes bilaterally. Vertebral body heights maintained without fracture or bone destruction. Disc space narrowing C6-C7. Question ununited ossification center versus sequela of remote trauma at tip of spinous process of T1. Soft tissues and spinal canal: Prevertebral soft tissues  normal  thickness. Disc levels:  No additional abnormalities Upper chest: LEFT upper lobe lung scarring.  RIGHT apex clear. Other: Extensive atherosclerotic calcifications in the LEFT carotid system. IMPRESSION: RIGHT periventricular mass lesion likely metastasis in patient with history of lung cancer, grossly stable in size at 14 x 13 x 13 mm. Atrophy with small vessel chronic ischemic changes of deep cerebral white matter. No acute intracranial abnormalities. Degenerative disc and facet disease changes of the cervical spine. No acute cervical spine abnormalities. Carotid vascular disease. Electronically Signed   By: Lavonia Dana M.D.   On: 02/15/2018 15:37   Dg Shoulder Left  Result Date: 02/15/2018 CLINICAL DATA:  67 year old male with weakness for the past 3 days. 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 No reported trauma. Initial encounter. EXAM: LEFT SHOULDER - 2+ VIEW COMPARISON:  01/25/2018 CT. FINDINGS: Moderate acromioclavicular joint degenerative changes. No fracture or dislocation. No abnormal soft tissue calcifications. No osseous destructive lesion. Post treatment changes left upper lung. Carotid bifurcation calcifications. IMPRESSION: Moderate left acromioclavicular joint degenerative changes. Electronically Signed   By: Genia Del M.D.   On: 02/15/2018 15:42   Dg Abd 2 Views  Result Date: 02/15/2018 CLINICAL DATA:  Weakness with decreased oral intake over the last 3 days. EXAM: ABDOMEN - 2 VIEW COMPARISON:  Radiographs 02/07/2018.  CT 01/25/2018. FINDINGS: There is persistent retained contrast material in the colon from the prior CT. The bowel gas pattern is normal. There is no free intraperitoneal air or extraluminal contrast material. Bilateral iliac stents and diffuse aortoiliac atherosclerosis are noted. IMPRESSION: Retained contrast material in the colon consistent with constipation. No acute findings. Electronically Signed   By: Richardean Sale  M.D.   On: 02/15/2018 15:42     Medical Consultants:    None.  Anti-Infectives:   None  Subjective:    Jesse Avila he relates he feels significantly better compared to yesterday.  Objective:    Vitals:   02/16/18 0516 02/16/18 1246 02/16/18 2110 02/17/18 0458  BP: 126/79 134/80 (!) 150/87 139/81  Pulse: 76 80 70 75  Resp: 18  18 18   Temp: 97.8 F (36.6 C) 97.6 F (36.4 C) 97.8 F (36.6 C) 97.9 F (36.6 C)  TempSrc: Oral Oral Oral   SpO2: 99% 95% 99% 100%  Weight:    64.3 kg (141 lb 11.2 oz)  Height:        Intake/Output Summary (Last 24 hours) at 02/17/2018 0925 Last data filed at 02/17/2018 0600 Gross per 24 hour  Intake 1896.25 ml  Output -  Net 1896.25 ml   Filed Weights   02/15/18 2308 02/17/18 0458  Weight: 63.6 kg (140 lb 3.4 oz) 64.3 kg (141 lb 11.2 oz)    Exam: General exam: In no acute distress, cachectic Respiratory system: Good air movement and clear to auscultation. Cardiovascular system: S1 & S2 heard, RRR.  Gastrointestinal system: Abdomen is nondistended, soft and nontender.  Central nervous system: Alert and oriented. No focal neurological deficits. Extremities: No pedal edema. Skin: Multiple bruises throughout his body Psychiatry: Judgement and insight appear normal. Mood & affect appropriate.    Data Reviewed:    Labs: Basic Metabolic Panel: Recent Labs  Lab 02/15/18 1414 02/16/18 0526 02/17/18 0500  NA 132* 133* 134*  K 3.1* 3.3* 3.3*  CL 94* 100 104  CO2 26 23 23   GLUCOSE 141* 162* 130*  BUN 43* 39* 29*  CREATININE 2.69* 2.54* 1.65*  CALCIUM 9.4 8.8* 8.6*  MG 2.6*  --   --  GFR Estimated Creatinine Clearance: 41.1 mL/min (A) (by C-G formula based on SCr of 1.65 mg/dL (H)). Liver Function Tests: Recent Labs  Lab 02/15/18 1414 02/16/18 0526  AST 17 16  ALT 26 24  ALKPHOS 59 53  BILITOT 0.9 0.4  PROT 6.8 6.2*  ALBUMIN 3.9 3.4*   Recent Labs  Lab 02/15/18 1414  LIPASE 32   No results for input(s):  AMMONIA in the last 168 hours. Coagulation profile No results for input(s): INR, PROTIME in the last 168 hours.  CBC: Recent Labs  Lab 02/15/18 1414 02/16/18 0526  WBC 9.3 6.4  NEUTROABS 7.6 5.2  HGB 11.4* 10.1*  HCT 32.7* 29.5*  MCV 99.4 100.7*  PLT 78* 65*   Cardiac Enzymes: No results for input(s): CKTOTAL, CKMB, CKMBINDEX, TROPONINI in the last 168 hours. BNP (last 3 results) No results for input(s): PROBNP in the last 8760 hours. CBG: Recent Labs  Lab 02/16/18 0100 02/16/18 1443 02/16/18 1629 02/16/18 2104 02/17/18 0713  GLUCAP 144* 199* 259* 142* 108*   D-Dimer: No results for input(s): DDIMER in the last 72 hours. Hgb A1c: No results for input(s): HGBA1C in the last 72 hours. Lipid Profile: No results for input(s): CHOL, HDL, LDLCALC, TRIG, CHOLHDL, LDLDIRECT in the last 72 hours. Thyroid function studies: No results for input(s): TSH, T4TOTAL, T3FREE, THYROIDAB in the last 72 hours.  Invalid input(s): FREET3 Anemia work up: No results for input(s): VITAMINB12, FOLATE, FERRITIN, TIBC, IRON, RETICCTPCT in the last 72 hours. Sepsis Labs: Recent Labs  Lab 02/15/18 1414 02/15/18 1427 02/16/18 0526  WBC 9.3  --  6.4  LATICACIDVEN  --  1.01  --    Microbiology No results found for this or any previous visit (from the past 240 hour(s)).   Medications:   . dexamethasone  2 mg Oral BID  . feeding supplement  1 Container Oral Q24H  . insulin aspart  0-9 Units Subcutaneous TID WC  . pantoprazole  40 mg Oral BID  . polyethylene glycol  17 g Oral BID  . senna-docusate  1 tablet Oral QHS   Continuous Infusions:     LOS: 1 day   Charlynne Cousins  [facility] Pager [phone]  *Please refer to [email or web address], password [email or web address] to get updated schedule on who will round on this patient, as hospitalists switch teams weekly. If 7PM-7AM, please contact night-coverage at [email or web address], password [email or web address] for any overnight needs.  02/17/2018, 9:25 AM

## 2018-02-18 ENCOUNTER — Telehealth: Payer: Self-pay | Admitting: *Deleted

## 2018-02-18 ENCOUNTER — Inpatient Hospital Stay: Payer: BLUE CROSS/BLUE SHIELD

## 2018-02-18 ENCOUNTER — Ambulatory Visit
Admission: RE | Admit: 2018-02-18 | Discharge: 2018-02-18 | Disposition: A | Discharge status: Home / Self Care | Payer: BLUE CROSS/BLUE SHIELD | Source: Ambulatory Visit | Attending: Radiation Oncology | Admitting: Radiation Oncology

## 2018-02-18 ENCOUNTER — Inpatient Hospital Stay: Payer: BLUE CROSS/BLUE SHIELD | Admitting: Oncology

## 2018-02-18 ENCOUNTER — Other Ambulatory Visit: Payer: Self-pay | Admitting: Internal Medicine

## 2018-02-18 DIAGNOSIS — I482 Chronic atrial fibrillation: Secondary | ICD-10-CM

## 2018-02-18 DIAGNOSIS — K5903 Drug induced constipation: Secondary | ICD-10-CM

## 2018-02-18 DIAGNOSIS — N179 Acute kidney failure, unspecified: Secondary | ICD-10-CM

## 2018-02-18 DIAGNOSIS — C3492 Malignant neoplasm of unspecified part of left bronchus or lung: Secondary | ICD-10-CM

## 2018-02-18 LAB — BASIC METABOLIC PANEL
Anion gap: 5 (ref 5–15)
BUN: 22 mg/dL (ref 8–23)
CHLORIDE: 109 mmol/L (ref 98–111)
CO2: 22 mmol/L (ref 22–32)
Calcium: 8.6 mg/dL — ABNORMAL LOW (ref 8.9–10.3)
Creatinine, Ser: 1.28 mg/dL — ABNORMAL HIGH (ref 0.61–1.24)
GFR calc Af Amer: >60 mL/min (ref >60)
GFR, EST NON AFRICAN AMERICAN: 58 mL/min — AB (ref >60)
Glucose, Bld: 114 mg/dL — ABNORMAL HIGH (ref 70–99)
POTASSIUM: 4 mmol/L (ref 3.5–5.1)
SODIUM: 136 mmol/L (ref 135–145)

## 2018-02-18 LAB — GLUCOSE, CAPILLARY
GLUCOSE-CAPILLARY: 98 mg/dL (ref 70–99)
GLUCOSE-CAPILLARY: 115 mg/dL — AB (ref 70–99)

## 2018-02-18 MED ORDER — METHYLNALTREXONE BROMIDE 12 MG/0.6ML ~~LOC~~ SOLN
12.0000 mg | Freq: Once | SUBCUTANEOUS | Status: AC
  Administered 2018-02-18: 12 mg via SUBCUTANEOUS
  Filled 2018-02-18: qty 0.6

## 2018-02-18 MED ORDER — HYDROCODONE-ACETAMINOPHEN 5-325 MG PO TABS: 1.0000 | ORAL_TABLET | Freq: Four times a day (QID) | ORAL | 0 refills | Status: DC | PRN

## 2018-02-18 MED ORDER — DRONABINOL 2.5 MG PO CAPS: 2.5000 mg | ORAL_CAPSULE | Freq: Two times a day (BID) | ORAL | 0 refills | Status: DC

## 2018-02-18 MED ORDER — POLYETHYLENE GLYCOL 3350 17 G PO PACK: 17.0000 g | PACK | Freq: Two times a day (BID) | ORAL | 0 refills | Status: DC

## 2018-02-18 MED ORDER — HYDROMORPHONE HCL 1 MG/ML IJ SOLN
1.0000 mg | INTRAMUSCULAR | Status: DC | PRN
  Administered 2018-02-18: 1 mg via INTRAVENOUS
  Filled 2018-02-18: qty 1

## 2018-02-18 MED ORDER — HYDROCODONE-ACETAMINOPHEN 5-325 MG PO TABS: 1.0000 | ORAL_TABLET | Freq: Four times a day (QID) | ORAL | Status: DC | PRN

## 2018-02-18 MED ORDER — HYDROCODONE-ACETAMINOPHEN 5-325 MG PO TABS
2.0000 | ORAL_TABLET | Freq: Four times a day (QID) | ORAL | Status: DC | PRN
  Administered 2018-02-18 (×2): 2 via ORAL
  Filled 2018-02-18 (×2): qty 2

## 2018-02-18 MED ORDER — HEPARIN SOD (PORK) LOCK FLUSH 100 UNIT/ML IV SOLN
500.0000 [IU] | INTRAVENOUS | Status: AC | PRN
  Administered 2018-02-18: 500 [IU]

## 2018-02-18 NOTE — Plan of Care (Signed)
Discharge instructions reviewed with patient and wife, questions answered, verbalized understanding.  Rx given for Miralax.  Patient states he does not have Marinol or Norco at home, contacted [place] RN regarding this who will discuss with Dr. Julien Nordmann.  Stressed to patient and wife that patient must have repeat blood work in one week as well as visit with PCP to assess platelets and other blood counts.  Patient transported to main entrance to be taken home by wife with discharge paperwork and all belongings in his possession.

## 2018-02-18 NOTE — Progress Notes (Signed)
Physical Therapy Treatment Patient Details Name: Jesse Avila MRN: [id number] DOB: [birth year] Today's Date: 02/18/2018    History of Present Illness Jesse Avila is a 67 y.o. male with 3 of atrial fibrillation, diabetes mellitus, anemia and thrombocytopenia likely related to chemotherapy with history of stage IV small cell lung cancer who had recently around 3 days ago had received brain radiation therapy admitted with weakness and 5 falls last few days.    PT Comments    Pt ambulated in hallway and performed a few exercises in supine.  Pt reports no dizziness today however educated on slowly progressing activity for safety.  Pt hopeful for d/c home later today.  Follow Up Recommendations  Home health PT;Supervision/Assistance - 24 hour     Equipment Recommendations  None recommended by PT    Recommendations for Other Services       Precautions / Restrictions Precautions Precautions: Fall    Mobility  Bed Mobility Overal bed mobility: Needs Assistance Bed Mobility: Supine to Sit;Sit to Supine     Supine to sit: Supervision Sit to supine: Supervision      Transfers Overall transfer level: Needs assistance Equipment used: Rolling walker (2 wheeled) Transfers: Sit to/from Stand Sit to Stand: Supervision         General transfer comment: educated for waiting a couple minutes upon sitting and then standing especially if dizziness occurs, pt denies dizziness at current time  Ambulation/Gait Ambulation/Gait assistance: Min guard Gait Distance (Feet): 140 Feet Assistive device: Rolling walker (2 wheeled) Gait Pattern/deviations: Step-through pattern;Decreased stride length     General Gait Details: pt denies dizziness, distance to tolerance, min/guard for safety however no LOB    Stairs             Wheelchair Mobility    Modified Rankin (Stroke Patients Only)       Balance                                            Cognition  Arousal/Alertness: Awake/alert Behavior During Therapy: WFL for tasks assessed/performed Overall Cognitive Status: Within Functional Limits for tasks assessed                                        Exercises General Exercises - Lower Extremity Ankle Circles/Pumps: AROM;10 reps;Both;Supine Heel Slides: AROM;10 reps;Both;Supine Hip ABduction/ADduction: AROM;10 reps;Both;Supine Straight Leg Raises: AROM;10 reps;Both    General Comments        Pertinent Vitals/Pain Pain Assessment: 0-10 Pain Score: 7  Pain Location: back Pain Descriptors / Indicators: Aching;Sore Pain Intervention(s): Premedicated before session;Monitored during session;Limited activity within patient's tolerance;Repositioned    Home Living                      Prior Function            PT Goals (current goals can now be found in the care plan section) Progress towards PT goals: Progressing toward goals    Frequency    Min 3X/week      PT Plan Current plan remains appropriate    Co-evaluation              AM-PAC PT "6 Clicks" Daily Activity  Outcome Measure  Difficulty turning over in bed (including adjusting bedclothes, sheets  and blankets)?: A Little Difficulty moving from lying on back to sitting on the side of the bed? : A Little Difficulty sitting down on and standing up from a chair with arms (e.g., wheelchair, bedside commode, etc,.)?: A Little Help needed moving to and from a bed to chair (including a wheelchair)?: A Little Help needed walking in hospital room?: A Little Help needed climbing 3-5 steps with a railing? : A Little 6 Click Score: 18    End of Session Equipment Utilized During Treatment: Gait belt Activity Tolerance: Patient tolerated treatment well Patient left: with call bell/phone within reach;in bed;with bed alarm set   PT Visit Diagnosis: Other abnormalities of gait and mobility (R26.89);Muscle weakness (generalized) (M62.81)      Time: 3013-1438 PT Time Calculation (min) (ACUTE ONLY): 21 min  Charges:  $Gait Training: 8-22 mins                    G Codes:      Carmelia Bake, PT, DPT 02/18/2018 Pager: [phone]   [name] 02/18/2018, 1:45 PM

## 2018-02-18 NOTE — Care Management Note (Signed)
Case Management Note  Patient Details  Name: Jesse Avila MRN: [id number] Date of Birth: 03-08-1951  Subjective/Objective: Weakness, PAD, Small cell lung carcinoma                   Action/Plan: Plan to discharge home with wife and family   Expected Discharge Date:  02/18/18               Expected Discharge Plan:  [place]  In-House Referral:     Discharge planning Services  CM Consult  Post Acute Care Choice:    Choice offered to:  Patient  DME Arranged:    DME Agency:     HH Arranged:  RN, PT [place] Agency:  [facility]  Status of Service:  In process, will continue to follow  If discussed at Long Length of Stay Meetings, dates discussed:    Additional CommentsPurcell Mouton, RN 02/18/2018, 3:50 PM

## 2018-02-18 NOTE — Discharge Summary (Addendum)
Physician Discharge Summary  Jesse Avila OEH:[id number] DOB: 1951/01/08 DOA: 02/15/2018  PCP: [name], MD  Admit date: 02/15/2018 Discharge date: 02/18/2018  Admitted From: home Disposition:  Home  Recommendations for Outpatient Follow-up:  1. Follow up with PCP in 1-2 weeks 2. Please obtain BMP/CBC in one week   Home Health:no Equipment/Devices:none  Discharge Condition:stable CODE STATUS: DNR Diet recommendation: Heart Healthy   Brief/Interim Summary: 67 y.o. 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 to the brain, presents to the ER because of worsening weakness and multiple falls in the last few days CT of the head showed no acute findings, abdominal x-ray showed moderate stool burden   Discharge Diagnoses:  Principal Problem:   Weakness Active Problems:   Essential hypertension, benign   PAD (peripheral artery disease) (HCC)   Small cell lung carcinoma, left (HCC)   Atrial fibrillation (HCC)   Hypokalemia   CKD (chronic kidney disease) stage 4, GFR 15-29 ml/min (HCC)   Antineoplastic chemotherapy induced anemia   Hyponatremia   Orthostatic hypotension   Protein-calorie malnutrition, severe  Generalized weakness likely due to hypovolemia/orthostatic hypotension: Orthostatic hypotension was likely due to decreased oral intake and diuretic therapy. Orthostatic vitals were checked which were positive he was given IV fluids for 2 days until his orthostatic resolves. Lasix and Aldactone were discontinued.  And his blood pressure has remained stable.  He will probably go home without these.  Acute Kidney injury superimposed on chronic renal disease stage III: On admission his creatinine was 2.5 after fluid hydration it came down to 1.2. This is likely due to a combination of decreased oral  intake and diuretic use.  His diuretic was discontinued.  Chronic atrial fibrillation: Rate controlled on amiodarone, his Eliquis was held due to mild thrombocytopenia as his platelet count improved his Eliquis was resumed.  Diabetes mellitus type 2 no changes were made.  Antineoplastic chemotherapy-induced anemia and thrombocytopenia: Likely due to immunotherapy, his counts have improved.  Essential hypertension: He will continue all of his home medication except diuretic therapy.  Electrolyte imbalance hypokalemia and hyponatremia: Likely due to hypovolemia resolved with IV fluid hydration.  Small lung cell carcinoma: The patient does not want any further immunotherapy, he went down to radiation which she tolerated well.  Constipation: He was given several doses of MiraLAX with minimal bowel movement he was given Relistor and he does improve his severe constipation. He will go home on MiraLAX twice a day.   Discharge Instructions   Allergies as of 02/18/2018      Reactions   Cialis [tadalafil] Other (See Comments)   Headache       Medication List    TAKE these medications   amiodarone 200 MG tablet Commonly known as:  PACERONE TAKE 1 TABLET BY MOUTH EVERY DAY   dexamethasone 2 MG tablet Commonly known as:  DECADRON Take 1 tablet (2 mg total) by mouth 2 (two) times daily.   dronabinol 2.5 MG capsule Commonly known as:  MARINOL Take 1 capsule (2.5 mg total) by mouth 2 (two) times daily before a meal.   ELIQUIS 5 MG Tabs tablet Generic drug:  apixaban Take 5 mg by mouth 2 (two) times daily.   furosemide 40 MG tablet Commonly known as:  LASIX TAKE 1 TABLET BY MOUTH TWICE A DAY   HYDROcodone-acetaminophen 5-325 MG tablet Commonly known as:  NORCO Take 1 tablet by mouth every 6 (six) hours as needed for moderate pain.   insulin glargine 100 UNIT/ML injection Commonly known as:  LANTUS Inject 0.1 mLs (10 Units total) into the skin daily. What changed:    how  much to take  when to take this   lactulose 10 GM/15ML solution Commonly known as:  CHRONULAC Take 30 mLs (20 g total) by mouth daily as needed for mild constipation or moderate constipation.   lidocaine 2 % solution Commonly known as:  XYLOCAINE Use as directed 15 mLs in the mouth or throat 3 (three) times daily before meals.   lidocaine-prilocaine cream Commonly known as:  EMLA Apply 1 application topically as needed.   LORazepam 1 MG tablet Commonly known as:  ATIVAN Take 1 mg by mouth 2 (two) times daily as needed for anxiety.   ondansetron 8 MG tablet Commonly known as:  ZOFRAN Take 1 tablet (8 mg total) by mouth every 8 (eight) hours as needed for nausea or vomiting.   pantoprazole 40 MG tablet Commonly known as:  PROTONIX Take 1 tablet (40 mg total) by mouth 2 (two) times daily for 15 days. What changed:    when to take this  reasons to take this   polyethylene glycol packet Commonly known as:  MIRALAX / GLYCOLAX Take 17 g by mouth 2 (two) times daily.   potassium chloride SA 20 MEQ tablet Commonly known as:  K-DUR,KLOR-CON Take 1 tablet (20 mEq total) by mouth 2 (two) times daily. What changed:  Another medication with the same name was changed. Make sure you understand how and when to take each.   KLOR-CON M20 20 MEQ tablet Generic drug:  potassium chloride SA TAKE 1/2 TABLET (10MEQ) BY MOUTH DAILY. What changed:  See the new instructions.   prochlorperazine 10 MG tablet Commonly known as:  COMPAZINE TAKE 1 TABLET (10 MG TOTAL) BY MOUTH EVERY 6 (SIX) HOURS AS NEEDED FOR NAUSEA OR VOMITING.   rosuvastatin 40 MG tablet Commonly known as:  CRESTOR Take 40 mg by mouth daily.   spironolactone 25 MG tablet Commonly known as:  ALDACTONE Take 1 tablet (25 mg total) by mouth daily.   sucralfate 1 GM/10ML suspension Commonly known as:  CARAFATE Take 10 mLs (1 g total) by mouth 4 (four) times daily - after meals and at bedtime.   zolpidem 10 MG  tablet Commonly known as:  AMBIEN Take 10 mg by mouth at bedtime as needed for sleep.            Durable Medical Equipment  (From admission, onward)        Start     Ordered   02/17/18 1106  For home use only DME Walker rolling  Once    Question:  Patient needs a walker to treat with the following condition  Answer:  Fear for personal safety   02/17/18 1106      Allergies  Allergen Reactions  . Cialis [Tadalafil] Other (See Comments)    Headache     Consultations:  None   Procedures/Studies: Ct Abdomen Pelvis Wo Contrast  Result Date: 01/26/2018 CLINICAL DATA:  Patient with history of lung cancer. Patient completed radiation therapy. Patient on chemotherapy. EXAM: CT CHEST, ABDOMEN AND PELVIS WITHOUT CONTRAST TECHNIQUE: Multidetector CT imaging of the chest, abdomen and pelvis was performed following the standard protocol without IV contrast. COMPARISON:  CT CAP 09/23/2017 FINDINGS: CT CHEST FINDINGS Cardiovascular: Right anterior chest wall Port-A-Cath is present with tip terminating  in the superior vena cava. Thoracic aortic and coronary arterial vascular calcifications. Normal heart size. Trace fluid superior pericardial recess. Mediastinum/Nodes: No enlarged axillary, mediastinal or hilar lymphadenopathy. Normal appearance of the esophagus. Lungs/Pleura: The lung apices are excluded from view. Interval development of predominately ground-glass tree-in-bud nodular opacities throughout the right lower lobe. Redemonstrated bilateral pleural calcifications. Interval decrease in size of small left pleural effusion. Re demonstrated volume loss within the left hemithorax with associated bronchiectasis and postradiation changes, similar when compared to prior exam. No pneumothorax. Musculoskeletal: Thoracic spine degenerative changes. No aggressive or acute appearing osseous lesions. CT ABDOMEN PELVIS FINDINGS Hepatobiliary: Liver is normal in size and contour. Probable small amount of  sludge within the gallbladder lumen. No intrahepatic or extrahepatic biliary ductal dilatation. Pancreas: Unremarkable Spleen: Unremarkable Adrenals/Urinary Tract: Normal adrenal glands. Kidneys are symmetric in size. No hydronephrosis. Urinary bladder is unremarkable. Stomach/Bowel: Sigmoid colonic diverticulosis. No CT evidence for acute diverticulitis. Stool throughout the colon. No evidence for small bowel obstruction. Normal morphology of the stomach. Focal narrowing of the mid transverse colon (image 39; series 4), nonspecific however likely secondary to peristalsis. Vascular/Lymphatic: Normal caliber abdominal aorta. Peripheral calcified atherosclerotic plaque. Re demonstrated graft within the distal aorta and common iliac arteries. Interval increase in size of mass between the duodenum and uncinate process of the pancreas measuring 5.7 x 4.5 cm (image 64; series 2), previously 4.6 x 3.3 cm. Re demonstrated postsurgical changes within the right inguinal region. Reproductive: Central dystrophic calcifications in the prostate. Other: None. Musculoskeletal: Lumbar spine degenerative changes. Patchy sclerosis in the left iliac bone (image 93; series 2) similar. IMPRESSION: 1. Similar-appearing postradiation changes within the left lung. No evidence for localized recurrence. 2. Interval increase in size of mass within the upper abdomen between the pancreatic head and duodenum, compatible with metastatic disease. 3. Predominately tree-in-bud ground-glass nodular opacities within the right lower lobe, new from prior, likely infectious/inflammatory in etiology. Recommend attention on follow-up. Electronically Signed   By: Lovey Newcomer M.D.   On: 01/26/2018 08:52   Ct Head Wo Contrast  Result Date: 02/15/2018 CLINICAL DATA:  Minor head trauma, on anti coagulation, weakness, not able to either walk since Friday after radiation treatment to the brain, cervical spine trauma, high clinical risk, history lung cancer  post radiation therapy, atrial fibrillation, carotid artery disease, hypertension, type II diabetes mellitus EXAM: CT HEAD WITHOUT CONTRAST CT CERVICAL SPINE WITHOUT CONTRAST TECHNIQUE: Multidetector CT imaging of the head and cervical spine was performed following the standard protocol without intravenous contrast. Multiplanar CT image reconstructions of the cervical spine were also generated. COMPARISON:  MR head 02/06/2018 FINDINGS: CT HEAD FINDINGS Brain: Generalized atrophy. Normal ventricular morphology. No midline shift or mass effect. Small vessel chronic ischemic changes of deep cerebral white matter. Deep RIGHT frontal periventricular mass 13 x 14 x 14 mm unchanged. 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 No intracranial hemorrhage, mass lesion, or evidence of acute infarction. No extra-axial fluid collections. Vascular: Atherosclerotic calcifications of internal carotid and vertebral arteries at skull base Skull: Intact Sinuses/Orbits: Visualized paranasal sinuses and mastoid air cells clear Other: N/A CT CERVICAL SPINE FINDINGS Alignment: Normal Skull base and vertebrae: Mild osseous demineralization. Visualized skull base intact. Scattered facet degenerative changes bilaterally. Vertebral body heights maintained without fracture or bone destruction. Disc space narrowing C6-C7. Question ununited ossification center versus sequela of remote trauma at tip of spinous process of T1. Soft tissues and spinal canal: Prevertebral soft tissues normal thickness. Disc levels:  No additional abnormalities Upper  chest: LEFT upper lobe lung scarring.  RIGHT apex clear. Other: Extensive atherosclerotic calcifications in the LEFT carotid system. IMPRESSION: RIGHT periventricular mass lesion likely metastasis in patient with history of lung cancer, grossly stable in size at 14 x 13 x 13 mm. Atrophy with small vessel chronic ischemic changes of deep cerebral white matter. No acute intracranial abnormalities.  Degenerative disc and facet disease changes of the cervical spine. No acute cervical spine abnormalities. Carotid vascular disease. Electronically Signed   By: Lavonia Dana M.D.   On: 02/15/2018 15:37   Ct Chest Wo Contrast  Result Date: 01/26/2018 CLINICAL DATA:  Patient with history of lung cancer. Patient completed radiation therapy. Patient on chemotherapy. EXAM: CT CHEST, ABDOMEN AND PELVIS WITHOUT CONTRAST TECHNIQUE: Multidetector CT imaging of the chest, abdomen and pelvis was performed following the standard protocol without IV contrast. COMPARISON:  CT CAP 09/23/2017 FINDINGS: CT CHEST FINDINGS Cardiovascular: Right anterior chest wall Port-A-Cath is present with tip terminating in the superior vena cava. Thoracic aortic and coronary arterial vascular calcifications. Normal heart size. Trace fluid superior pericardial recess. Mediastinum/Nodes: No enlarged axillary, mediastinal or hilar lymphadenopathy. Normal appearance of the esophagus. Lungs/Pleura: The lung apices are excluded from view. Interval development of predominately ground-glass tree-in-bud nodular opacities throughout the right lower lobe. Redemonstrated bilateral pleural calcifications. Interval decrease in size of small left pleural effusion. Re demonstrated volume loss within the left hemithorax with associated bronchiectasis and postradiation changes, similar when compared to prior exam. No pneumothorax. Musculoskeletal: Thoracic spine degenerative changes. No aggressive or acute appearing osseous lesions. CT ABDOMEN PELVIS FINDINGS Hepatobiliary: Liver is normal in size and contour. Probable small amount of sludge within the gallbladder lumen. No intrahepatic or extrahepatic biliary ductal dilatation. Pancreas: Unremarkable Spleen: Unremarkable Adrenals/Urinary Tract: Normal adrenal glands. Kidneys are symmetric in size. No hydronephrosis. Urinary bladder is unremarkable. Stomach/Bowel: Sigmoid colonic diverticulosis. No CT evidence  for acute diverticulitis. Stool throughout the colon. No evidence for small bowel obstruction. Normal morphology of the stomach. Focal narrowing of the mid transverse colon (image 39; series 4), nonspecific however likely secondary to peristalsis. Vascular/Lymphatic: Normal caliber abdominal aorta. Peripheral calcified atherosclerotic plaque. Re demonstrated graft within the distal aorta and common iliac arteries. Interval increase in size of mass between the duodenum and uncinate process of the pancreas measuring 5.7 x 4.5 cm (image 64; series 2), previously 4.6 x 3.3 cm. Re demonstrated postsurgical changes within the right inguinal region. Reproductive: Central dystrophic calcifications in the prostate. Other: None. Musculoskeletal: Lumbar spine degenerative changes. Patchy sclerosis in the left iliac bone (image 93; series 2) similar. IMPRESSION: 1. Similar-appearing postradiation changes within the left lung. No evidence for localized recurrence. 2. Interval increase in size of mass within the upper abdomen between the pancreatic head and duodenum, compatible with metastatic disease. 3. Predominately tree-in-bud ground-glass nodular opacities within the right lower lobe, new from prior, likely infectious/inflammatory in etiology. Recommend attention on follow-up. Electronically Signed   By: Lovey Newcomer M.D.   On: 01/26/2018 08:52   Ct Cervical Spine Wo Contrast  Result Date: 02/15/2018 CLINICAL DATA:  Minor head trauma, on anti coagulation, weakness, not able to either walk since Friday after radiation treatment to the brain, cervical spine trauma, high clinical risk, history lung cancer post radiation therapy, atrial fibrillation, carotid artery disease, hypertension, type II diabetes mellitus EXAM: CT HEAD WITHOUT CONTRAST CT CERVICAL SPINE WITHOUT CONTRAST TECHNIQUE: Multidetector CT imaging of the head and cervical spine was performed following the standard protocol without intravenous contrast.  Multiplanar  CT image reconstructions of the cervical spine were also generated. COMPARISON:  MR head 02/06/2018 FINDINGS: CT HEAD FINDINGS Brain: Generalized atrophy. Normal ventricular morphology. No midline shift or mass effect. Small vessel chronic ischemic changes of deep cerebral white matter. Deep RIGHT frontal periventricular mass 13 x 14 x 14 mm unchanged. 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 No intracranial hemorrhage, mass lesion, or evidence of acute infarction. No extra-axial fluid collections. Vascular: Atherosclerotic calcifications of internal carotid and vertebral arteries at skull base Skull: Intact Sinuses/Orbits: Visualized paranasal sinuses and mastoid air cells clear Other: N/A CT CERVICAL SPINE FINDINGS Alignment: Normal Skull base and vertebrae: Mild osseous demineralization. Visualized skull base intact. Scattered facet degenerative changes bilaterally. Vertebral body heights maintained without fracture or bone destruction. Disc space narrowing C6-C7. Question ununited ossification center versus sequela of remote trauma at tip of spinous process of T1. Soft tissues and spinal canal: Prevertebral soft tissues normal thickness. Disc levels:  No additional abnormalities Upper chest: LEFT upper lobe lung scarring.  RIGHT apex clear. Other: Extensive atherosclerotic calcifications in the LEFT carotid system. IMPRESSION: RIGHT periventricular mass lesion likely metastasis in patient with history of lung cancer, grossly stable in size at 14 x 13 x 13 mm. Atrophy with small vessel chronic ischemic changes of deep cerebral white matter. No acute intracranial abnormalities. Degenerative disc and facet disease changes of the cervical spine. No acute cervical spine abnormalities. Carotid vascular disease. Electronically Signed   By: Lavonia Dana M.D.   On: 02/15/2018 15:37   Mr Jeri Cos ZO Contrast  Result Date: 02/06/2018 CLINICAL DATA:  Stage IV lung cancer, small cell. Previous PCI in  2018. Patient developed symptoms of dizziness and falling, with repeat noncontrast MRI suspicious for cerebral metastatic disease. Preprocedure evaluation for SRS. EXAM: MRI HEAD WITHOUT AND WITH CONTRAST TECHNIQUE: Multiplanar, multiecho pulse sequences of the brain and surrounding structures were obtained without and with intravenous contrast. CONTRAST:  68mL MULTIHANCE GADOBENATE DIMEGLUMINE 529 MG/ML IV SOLN COMPARISON:  Noncontrast brain MR 01/15/2018. FINDINGS: Brain: Solitary enhancing lesion, RIGHT frontal periventricular white matter, central necrosis, measures 15 x 14 x 18 mm. Interval growth compared with noncontrast CT from June 14. Mild surrounding vasogenic edema. No blood products or susceptibility. Generalized atrophy, premature for age. Multiple chronic infarcts as previously noted. T2 and FLAIR hyperintensities in the white matter, likely small vessel disease. Vascular: Flow voids are maintained. Skull and upper cervical spine: No osseous findings of significance. Sinuses/Orbits: No layering sinus fluid.  Negative orbits. Other: Trace LEFT mastoid fluid. IMPRESSION: Interval growth of a solitary, centrally necrotic RIGHT frontal periventricular metastasis, now 15 x 14 x 18 mm. See discussion above. Electronically Signed   By: Staci Righter M.D.   On: 02/06/2018 13:20   Dg Shoulder Left  Result Date: 02/15/2018 CLINICAL DATA:  67 year old male with weakness for the past 3 days. 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 No reported trauma. Initial encounter. EXAM: LEFT SHOULDER - 2+ VIEW COMPARISON:  01/25/2018 CT. FINDINGS: Moderate acromioclavicular joint degenerative changes. No fracture or dislocation. No abnormal soft tissue calcifications. No osseous destructive lesion. Post treatment changes left upper lung. Carotid bifurcation calcifications. IMPRESSION: Moderate left acromioclavicular joint degenerative changes. Electronically Signed   By: Genia Del M.D.   On: 02/15/2018 15:42   Dg Abd 2 Views  Result Date: 02/15/2018 CLINICAL DATA:  Weakness with decreased oral intake over the last 3 days. EXAM: ABDOMEN - 2 VIEW COMPARISON:  Radiographs 02/07/2018.  CT  01/25/2018. FINDINGS: There is persistent retained contrast material in the colon from the prior CT. The bowel gas pattern is normal. There is no free intraperitoneal air or extraluminal contrast material. Bilateral iliac stents and diffuse aortoiliac atherosclerosis are noted. IMPRESSION: Retained contrast material in the colon consistent with constipation. No acute findings. Electronically Signed   By: Richardean Sale M.D.   On: 02/15/2018 15:42   Dg Abd 2 Views  Result Date: 02/07/2018 CLINICAL DATA:  Vomiting for 3 days. EXAM: ABDOMEN - 2 VIEW COMPARISON:  CT chest, abdomen and pelvis 01/25/2018. FINDINGS: No free intraperitoneal air is identified. The bowel gas pattern is nonobstructive. Contrast material is seen in the descending colon through the rectosigmoid which may be residual contrast from the patient's CT. Moderate stool burden ascending colon noted. Stents in the iliac arteries are noted. No acute bony abnormality. IMPRESSION: No acute finding. Electronically Signed   By: Inge Rise M.D.   On: 02/07/2018 14:43     Subjective: No complains  Discharge Exam: Vitals:   02/17/18 2107 02/18/18 0521  BP: 126/78 138/84  Pulse: 72 76  Resp: 20   Temp: 97.8 F (36.6 C) 98 F (36.7 C)  SpO2: 98% 100%   Vitals:   02/17/18 1225 02/17/18 2107 02/18/18 0500 02/18/18 0521  BP: (!) 142/85 126/78  138/84  Pulse: 71 72  76  Resp:  20    Temp:  97.8 F (36.6 C)  98 F (36.7 C)  TempSrc:  Oral  Oral  SpO2:  98%  100%  Weight:   66.6 kg (146 lb 12.8 oz)   Height:        General: Pt is alert, awake, not in acute distress Cardiovascular: RRR, S1/S2 +, no rubs, no gallops Respiratory: CTA bilaterally, no wheezing, no rhonchi Abdominal: Soft, NT, ND, bowel sounds  + Extremities: no edema, no cyanosis    The results of significant diagnostics from this hospitalization (including imaging, microbiology, ancillary and laboratory) are listed below for reference.     Microbiology: No results found for this or any previous visit (from the past 240 hour(s)).   Labs: BNP (last 3 results) Recent Labs    03/01/17 0134  BNP 161.0*   Basic Metabolic Panel: Recent Labs  Lab 02/15/18 1414 02/16/18 0526 02/17/18 0500 02/18/18 0341  NA 132* 133* 134* 136  K 3.1* 3.3* 3.3* 4.0  CL 94* 100 104 109  CO2 26 23 23 22   GLUCOSE 141* 162* 130* 114*  BUN 43* 39* 29* 22  CREATININE 2.69* 2.54* 1.65* 1.28*  CALCIUM 9.4 8.8* 8.6* 8.6*  MG 2.6*  --   --   --    Liver Function Tests: Recent Labs  Lab 02/15/18 1414 02/16/18 0526  AST 17 16  ALT 26 24  ALKPHOS 59 53  BILITOT 0.9 0.4  PROT 6.8 6.2*  ALBUMIN 3.9 3.4*   Recent Labs  Lab 02/15/18 1414  LIPASE 32   No results for input(s): AMMONIA in the last 168 hours. CBC: Recent Labs  Lab 02/15/18 1414 02/16/18 0526 02/17/18 1149  WBC 9.3 6.4 7.0  NEUTROABS 7.6 5.2 5.9  HGB 11.4* 10.1* 9.7*  HCT 32.7* 29.5* 27.8*  MCV 99.4 100.7* 101.1*  PLT 78* 65* 67*   Cardiac Enzymes: No results for input(s): CKTOTAL, CKMB, CKMBINDEX, TROPONINI in the last 168 hours. BNP: Invalid input(s): POCBNP CBG: Recent Labs  Lab 02/17/18 0713 02/17/18 1215 02/17/18 1646 02/17/18 2105 02/18/18 0746  GLUCAP 108* 154* 140* 139* 98  D-Dimer No results for input(s): DDIMER in the last 72 hours. Hgb A1c No results for input(s): HGBA1C in the last 72 hours. Lipid Profile No results for input(s): CHOL, HDL, LDLCALC, TRIG, CHOLHDL, LDLDIRECT in the last 72 hours. Thyroid function studies No results for input(s): TSH, T4TOTAL, T3FREE, THYROIDAB in the last 72 hours.  Invalid input(s): FREET3 Anemia work up No results for input(s): VITAMINB12, FOLATE, FERRITIN, TIBC, IRON, RETICCTPCT in the last 72  hours. Urinalysis    Component Value Date/Time   COLORURINE YELLOW 02/15/2018 1718   APPEARANCEUR CLOUDY (A) 02/15/2018 1718   LABSPEC 1.014 02/15/2018 1718   PHURINE 5.0 02/15/2018 1718   GLUCOSEU NEGATIVE 02/15/2018 1718   HGBUR MODERATE (A) 02/15/2018 1718   BILIRUBINUR NEGATIVE 02/15/2018 1718   KETONESUR NEGATIVE 02/15/2018 1718   PROTEINUR 30 (A) 02/15/2018 1718   UROBILINOGEN 0.2 05/15/2015 0900   NITRITE NEGATIVE 02/15/2018 1718   LEUKOCYTESUR NEGATIVE 02/15/2018 1718   Sepsis Labs Invalid input(s): PROCALCITONIN,  WBC,  LACTICIDVEN Microbiology No results found for this or any previous visit (from the past 240 hour(s)).   Time coordinating discharge: 35 mins  SIGNED:   Charlynne Cousins, MD  [facility] 02/18/2018, 9:08 AM Pager   If 7PM-7AM, please contact night-coverage [email or web address] Password [email or web address]

## 2018-02-18 NOTE — Telephone Encounter (Signed)
RN from [facility] called, pt being discharged from hospital and asking for pain meds  Norco and appetite stimulate Marinol 2.5mg . Advised RN I will review with MD and call pt with additional information.

## 2018-02-19 ENCOUNTER — Ambulatory Visit
Admission: RE | Admit: 2018-02-19 | Discharge: 2018-02-19 | Disposition: A | Discharge status: Home / Self Care | Payer: BLUE CROSS/BLUE SHIELD | Source: Ambulatory Visit | Attending: Radiation Oncology | Admitting: Radiation Oncology

## 2018-02-19 DIAGNOSIS — Z510 Encounter for antineoplastic radiation therapy: Secondary | ICD-10-CM | POA: Diagnosis not present

## 2018-02-19 DIAGNOSIS — C3492 Malignant neoplasm of unspecified part of left bronchus or lung: Secondary | ICD-10-CM | POA: Diagnosis not present

## 2018-02-19 DIAGNOSIS — C7931 Secondary malignant neoplasm of brain: Secondary | ICD-10-CM | POA: Diagnosis not present

## 2018-02-22 ENCOUNTER — Ambulatory Visit
Admission: RE | Admit: 2018-02-22 | Discharge: 2018-02-22 | Disposition: A | Discharge status: Home / Self Care | Payer: BLUE CROSS/BLUE SHIELD | Source: Ambulatory Visit | Attending: Radiation Oncology | Admitting: Radiation Oncology

## 2018-02-22 DIAGNOSIS — Z510 Encounter for antineoplastic radiation therapy: Secondary | ICD-10-CM | POA: Diagnosis not present

## 2018-02-23 ENCOUNTER — Ambulatory Visit
Admission: RE | Admit: 2018-02-23 | Discharge: 2018-02-23 | Disposition: A | Discharge status: Home / Self Care | Payer: BLUE CROSS/BLUE SHIELD | Source: Ambulatory Visit | Attending: Radiation Oncology | Admitting: Radiation Oncology

## 2018-02-23 DIAGNOSIS — Z510 Encounter for antineoplastic radiation therapy: Secondary | ICD-10-CM | POA: Diagnosis not present

## 2018-02-24 ENCOUNTER — Encounter: Payer: Self-pay | Admitting: Radiation Oncology

## 2018-02-24 ENCOUNTER — Ambulatory Visit
Admission: RE | Admit: 2018-02-24 | Discharge: 2018-02-24 | Disposition: A | Discharge status: Home / Self Care | Payer: BLUE CROSS/BLUE SHIELD | Source: Ambulatory Visit | Attending: Radiation Oncology | Admitting: Radiation Oncology

## 2018-02-24 DIAGNOSIS — Z510 Encounter for antineoplastic radiation therapy: Secondary | ICD-10-CM | POA: Diagnosis not present

## 2018-02-25 ENCOUNTER — Other Ambulatory Visit: Payer: Self-pay | Admitting: Oncology

## 2018-02-25 ENCOUNTER — Other Ambulatory Visit: Payer: Self-pay | Admitting: Medical Oncology

## 2018-02-25 ENCOUNTER — Ambulatory Visit: Payer: BLUE CROSS/BLUE SHIELD

## 2018-02-25 ENCOUNTER — Telehealth: Payer: Self-pay | Admitting: *Deleted

## 2018-02-25 DIAGNOSIS — R112 Nausea with vomiting, unspecified: Secondary | ICD-10-CM

## 2018-02-25 DIAGNOSIS — C3492 Malignant neoplasm of unspecified part of left bronchus or lung: Secondary | ICD-10-CM

## 2018-02-25 MED ORDER — HYDROCODONE-ACETAMINOPHEN 5-325 MG PO TABS: 1.0000 | ORAL_TABLET | Freq: Four times a day (QID) | ORAL | 0 refills | Status: DC | PRN

## 2018-02-25 MED ORDER — PROCHLORPERAZINE MALEATE 10 MG PO TABS: 10.0000 mg | ORAL_TABLET | Freq: Four times a day (QID) | ORAL | 0 refills | Status: DC | PRN

## 2018-02-25 NOTE — Telephone Encounter (Signed)
Hydrocodone was e-scribed to [facility].

## 2018-02-25 NOTE — Telephone Encounter (Signed)
Voicemail received requesting "Refills for Hydrocodone and the medicine for nausea".  Message forwarded to collaborative to assist with this request.

## 2018-02-26 ENCOUNTER — Ambulatory Visit: Payer: BLUE CROSS/BLUE SHIELD

## 2018-03-01 ENCOUNTER — Ambulatory Visit: Payer: BLUE CROSS/BLUE SHIELD

## 2018-03-01 NOTE — Progress Notes (Signed)
  Radiation Oncology         [phone] ________________________________  Name: Jesse Avila MRN: [id number]  Date: 02/12/2018  DOB: Dec 17, 1950  End of Treatment Note  Diagnosis:   67 y.o. 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   There were no signs of acute toxicity after treatment.  Plan: The patient has completed radiation treatment. The patient will return to radiation oncology clinic for routine followup in one month. I advised the patient to call or return sooner if they have any questions or concerns related to their recovery or treatment. ________________________________  Jodelle Gross, MD, PhD  This document serves as a record of services personally performed by Kyung Rudd, MD. It was created on his behalf by Rae Lips, a trained medical scribe. The creation of this record is based on the scribe's personal observations and the provider's statements to them. This document has been checked and approved by the attending provider.

## 2018-03-02 ENCOUNTER — Ambulatory Visit: Payer: BLUE CROSS/BLUE SHIELD

## 2018-03-03 ENCOUNTER — Ambulatory Visit: Payer: BLUE CROSS/BLUE SHIELD

## 2018-03-03 ENCOUNTER — Inpatient Hospital Stay (HOSPITAL_COMMUNITY)
Admission: EM | Admit: 2018-03-03 | Discharge: 2018-03-05 | DRG: 683 | Disposition: A | Discharge status: Hospice | Payer: BLUE CROSS/BLUE SHIELD | Attending: Internal Medicine | Admitting: Internal Medicine

## 2018-03-03 ENCOUNTER — Other Ambulatory Visit: Payer: Self-pay

## 2018-03-03 ENCOUNTER — Emergency Department (HOSPITAL_COMMUNITY): Payer: BLUE CROSS/BLUE SHIELD

## 2018-03-03 ENCOUNTER — Encounter (HOSPITAL_COMMUNITY): Payer: Self-pay | Admitting: Emergency Medicine

## 2018-03-03 DIAGNOSIS — I739 Peripheral vascular disease, unspecified: Secondary | ICD-10-CM | POA: Diagnosis present

## 2018-03-03 DIAGNOSIS — Z794 Long term (current) use of insulin: Secondary | ICD-10-CM

## 2018-03-03 DIAGNOSIS — C781 Secondary malignant neoplasm of mediastinum: Secondary | ICD-10-CM | POA: Diagnosis present

## 2018-03-03 DIAGNOSIS — Z86010 Personal history of colonic polyps: Secondary | ICD-10-CM

## 2018-03-03 DIAGNOSIS — E1122 Type 2 diabetes mellitus with diabetic chronic kidney disease: Secondary | ICD-10-CM | POA: Diagnosis present

## 2018-03-03 DIAGNOSIS — G8929 Other chronic pain: Secondary | ICD-10-CM | POA: Diagnosis present

## 2018-03-03 DIAGNOSIS — C3492 Malignant neoplasm of unspecified part of left bronchus or lung: Secondary | ICD-10-CM | POA: Diagnosis present

## 2018-03-03 DIAGNOSIS — R531 Weakness: Secondary | ICD-10-CM | POA: Diagnosis not present

## 2018-03-03 DIAGNOSIS — Z66 Do not resuscitate: Secondary | ICD-10-CM | POA: Diagnosis present

## 2018-03-03 DIAGNOSIS — I130 Hypertensive heart and chronic kidney disease with heart failure and stage 1 through stage 4 chronic kidney disease, or unspecified chronic kidney disease: Secondary | ICD-10-CM | POA: Diagnosis present

## 2018-03-03 DIAGNOSIS — E785 Hyperlipidemia, unspecified: Secondary | ICD-10-CM | POA: Diagnosis present

## 2018-03-03 DIAGNOSIS — K219 Gastro-esophageal reflux disease without esophagitis: Secondary | ICD-10-CM | POA: Diagnosis present

## 2018-03-03 DIAGNOSIS — E119 Type 2 diabetes mellitus without complications: Secondary | ICD-10-CM

## 2018-03-03 DIAGNOSIS — Z8349 Family history of other endocrine, nutritional and metabolic diseases: Secondary | ICD-10-CM

## 2018-03-03 DIAGNOSIS — I5032 Chronic diastolic (congestive) heart failure: Secondary | ICD-10-CM | POA: Diagnosis not present

## 2018-03-03 DIAGNOSIS — I951 Orthostatic hypotension: Secondary | ICD-10-CM | POA: Diagnosis present

## 2018-03-03 DIAGNOSIS — D61818 Other pancytopenia: Secondary | ICD-10-CM

## 2018-03-03 DIAGNOSIS — Z7902 Long term (current) use of antithrombotics/antiplatelets: Secondary | ICD-10-CM

## 2018-03-03 DIAGNOSIS — R627 Adult failure to thrive: Secondary | ICD-10-CM | POA: Diagnosis present

## 2018-03-03 DIAGNOSIS — N184 Chronic kidney disease, stage 4 (severe): Secondary | ICD-10-CM | POA: Diagnosis present

## 2018-03-03 DIAGNOSIS — M1712 Unilateral primary osteoarthritis, left knee: Secondary | ICD-10-CM | POA: Diagnosis present

## 2018-03-03 DIAGNOSIS — R6251 Failure to thrive (child): Secondary | ICD-10-CM

## 2018-03-03 DIAGNOSIS — C7951 Secondary malignant neoplasm of bone: Secondary | ICD-10-CM | POA: Diagnosis present

## 2018-03-03 DIAGNOSIS — Z79899 Other long term (current) drug therapy: Secondary | ICD-10-CM

## 2018-03-03 DIAGNOSIS — Z833 Family history of diabetes mellitus: Secondary | ICD-10-CM

## 2018-03-03 DIAGNOSIS — J449 Chronic obstructive pulmonary disease, unspecified: Secondary | ICD-10-CM | POA: Diagnosis present

## 2018-03-03 DIAGNOSIS — G4700 Insomnia, unspecified: Secondary | ICD-10-CM | POA: Diagnosis present

## 2018-03-03 DIAGNOSIS — R112 Nausea with vomiting, unspecified: Secondary | ICD-10-CM

## 2018-03-03 DIAGNOSIS — Z87891 Personal history of nicotine dependence: Secondary | ICD-10-CM

## 2018-03-03 DIAGNOSIS — Z8673 Personal history of transient ischemic attack (TIA), and cerebral infarction without residual deficits: Secondary | ICD-10-CM

## 2018-03-03 DIAGNOSIS — E860 Dehydration: Secondary | ICD-10-CM | POA: Diagnosis present

## 2018-03-03 DIAGNOSIS — C7889 Secondary malignant neoplasm of other digestive organs: Secondary | ICD-10-CM | POA: Diagnosis present

## 2018-03-03 DIAGNOSIS — N179 Acute kidney failure, unspecified: Principal | ICD-10-CM | POA: Diagnosis present

## 2018-03-03 DIAGNOSIS — D696 Thrombocytopenia, unspecified: Secondary | ICD-10-CM

## 2018-03-03 DIAGNOSIS — D709 Neutropenia, unspecified: Secondary | ICD-10-CM

## 2018-03-03 DIAGNOSIS — E876 Hypokalemia: Secondary | ICD-10-CM | POA: Diagnosis present

## 2018-03-03 DIAGNOSIS — Z7952 Long term (current) use of systemic steroids: Secondary | ICD-10-CM

## 2018-03-03 DIAGNOSIS — E1151 Type 2 diabetes mellitus with diabetic peripheral angiopathy without gangrene: Secondary | ICD-10-CM | POA: Diagnosis present

## 2018-03-03 DIAGNOSIS — Z823 Family history of stroke: Secondary | ICD-10-CM

## 2018-03-03 DIAGNOSIS — I480 Paroxysmal atrial fibrillation: Secondary | ICD-10-CM | POA: Diagnosis present

## 2018-03-03 DIAGNOSIS — Z8249 Family history of ischemic heart disease and other diseases of the circulatory system: Secondary | ICD-10-CM

## 2018-03-03 DIAGNOSIS — Z923 Personal history of irradiation: Secondary | ICD-10-CM

## 2018-03-03 DIAGNOSIS — Z9221 Personal history of antineoplastic chemotherapy: Secondary | ICD-10-CM

## 2018-03-03 DIAGNOSIS — Z888 Allergy status to other drugs, medicaments and biological substances status: Secondary | ICD-10-CM

## 2018-03-03 DIAGNOSIS — C3490 Malignant neoplasm of unspecified part of unspecified bronchus or lung: Secondary | ICD-10-CM | POA: Diagnosis present

## 2018-03-03 DIAGNOSIS — M109 Gout, unspecified: Secondary | ICD-10-CM | POA: Diagnosis present

## 2018-03-03 LAB — BASIC METABOLIC PANEL
Anion gap: 12 (ref 5–15)
BUN: 40 mg/dL — AB (ref 8–23)
CO2: 23 mmol/L (ref 22–32)
Calcium: 8.8 mg/dL — ABNORMAL LOW (ref 8.9–10.3)
Chloride: 104 mmol/L (ref 98–111)
Creatinine, Ser: 1.85 mg/dL — ABNORMAL HIGH (ref 0.61–1.24)
GFR calc Af Amer: 43 mL/min — ABNORMAL LOW (ref >60)
GFR, EST NON AFRICAN AMERICAN: 37 mL/min — AB (ref >60)
GLUCOSE: 98 mg/dL (ref 70–99)
POTASSIUM: 3 mmol/L — AB (ref 3.5–5.1)
SODIUM: 139 mmol/L (ref 135–145)

## 2018-03-03 LAB — CBC
HEMATOCRIT: 31.8 % — AB (ref 39.0–52.0)
Hemoglobin: 10.9 g/dL — ABNORMAL LOW (ref 13.0–17.0)
MCH: 34.4 pg — ABNORMAL HIGH (ref 26.0–34.0)
MCHC: 34.3 g/dL (ref 30.0–36.0)
MCV: 100.3 fL — ABNORMAL HIGH (ref 78.0–100.0)
PLATELETS: 36 10*3/uL — AB (ref 150–400)
RBC: 3.17 MIL/uL — ABNORMAL LOW (ref 4.22–5.81)
RDW: 15.1 % (ref 11.5–15.5)
WBC: 2.6 10*3/uL — AB (ref 4.0–10.5)

## 2018-03-03 LAB — TYPE AND SCREEN
ABO/RH(D): O POS
Antibody Screen: NEGATIVE

## 2018-03-03 LAB — HEPATIC FUNCTION PANEL
ALBUMIN: 3 g/dL — AB (ref 3.5–5.0)
ALK PHOS: 53 U/L (ref 38–126)
ALT: 27 U/L (ref 0–44)
AST: 20 U/L (ref 15–41)
BILIRUBIN INDIRECT: 0.7 mg/dL (ref 0.3–0.9)
Bilirubin, Direct: 0.3 mg/dL — ABNORMAL HIGH (ref 0.0–0.2)
TOTAL PROTEIN: 5.8 g/dL — AB (ref 6.5–8.1)
Total Bilirubin: 1 mg/dL (ref 0.3–1.2)

## 2018-03-03 LAB — CBG MONITORING, ED: Glucose-Capillary: 81 mg/dL (ref 70–99)

## 2018-03-03 MED ORDER — PROCHLORPERAZINE MALEATE 10 MG PO TABS
10.0000 mg | ORAL_TABLET | Freq: Four times a day (QID) | ORAL | Status: DC | PRN
  Administered 2018-03-04: 10 mg via ORAL
  Filled 2018-03-03: qty 1

## 2018-03-03 MED ORDER — SODIUM CHLORIDE 0.9 % IV SOLN: Freq: Once | INTRAVENOUS | Status: DC

## 2018-03-03 MED ORDER — ENSURE ENLIVE PO LIQD
237.0000 mL | Freq: Two times a day (BID) | ORAL | Status: DC
  Administered 2018-03-04: 237 mL via ORAL

## 2018-03-03 MED ORDER — POTASSIUM CHLORIDE 10 MEQ/100ML IV SOLN
10.0000 meq | INTRAVENOUS | Status: AC
  Administered 2018-03-03 (×2): 10 meq via INTRAVENOUS
  Filled 2018-03-03 (×2): qty 100

## 2018-03-03 MED ORDER — AMIODARONE HCL 200 MG PO TABS
200.0000 mg | ORAL_TABLET | Freq: Every day | ORAL | Status: DC
  Administered 2018-03-04 – 2018-03-05 (×2): 200 mg via ORAL
  Filled 2018-03-03 (×3): qty 1

## 2018-03-03 MED ORDER — SODIUM CHLORIDE 0.9 % IV BOLUS
1000.0000 mL | Freq: Once | INTRAVENOUS | Status: AC
  Administered 2018-03-03: 1000 mL via INTRAVENOUS

## 2018-03-03 MED ORDER — HYDROCODONE-ACETAMINOPHEN 5-325 MG PO TABS
1.0000 | ORAL_TABLET | Freq: Four times a day (QID) | ORAL | Status: DC | PRN
  Administered 2018-03-03 – 2018-03-05 (×5): 1 via ORAL
  Filled 2018-03-03 (×6): qty 1

## 2018-03-03 MED ORDER — ZOLPIDEM TARTRATE 10 MG PO TABS
10.0000 mg | ORAL_TABLET | Freq: Every day | ORAL | Status: DC
  Administered 2018-03-03 – 2018-03-04 (×2): 10 mg via ORAL
  Filled 2018-03-03 (×2): qty 1

## 2018-03-03 MED ORDER — DRONABINOL 2.5 MG PO CAPS
2.5000 mg | ORAL_CAPSULE | Freq: Two times a day (BID) | ORAL | Status: DC
  Administered 2018-03-04 – 2018-03-05 (×3): 2.5 mg via ORAL
  Filled 2018-03-03 (×3): qty 1

## 2018-03-03 MED ORDER — ACETAMINOPHEN 500 MG PO TABS: 500.0000 mg | ORAL_TABLET | Freq: Four times a day (QID) | ORAL | Status: DC | PRN

## 2018-03-03 MED ORDER — POTASSIUM CHLORIDE CRYS ER 20 MEQ PO TBCR
40.0000 meq | EXTENDED_RELEASE_TABLET | Freq: Once | ORAL | Status: AC
  Administered 2018-03-03: 40 meq via ORAL
  Filled 2018-03-03: qty 2

## 2018-03-03 MED ORDER — DEXAMETHASONE 4 MG PO TABS
2.0000 mg | ORAL_TABLET | Freq: Two times a day (BID) | ORAL | Status: DC
  Administered 2018-03-03 – 2018-03-05 (×4): 2 mg via ORAL
  Filled 2018-03-03 (×4): qty 1

## 2018-03-03 MED ORDER — POTASSIUM CHLORIDE IN NACL 40-0.9 MEQ/L-% IV SOLN
INTRAVENOUS | Status: DC
  Administered 2018-03-03: 100 mL/h via INTRAVENOUS
  Filled 2018-03-03 (×2): qty 1000

## 2018-03-03 NOTE — ED Provider Notes (Signed)
  Face-to-face evaluation   History: Patient here for evaluation of general weakness.  He is debilitated, has had a palliative consult and is due for hospice evaluation tomorrow, at his home.  He was receiving chemotherapy as recently as 1 month ago.  Physical exam: Patient orthostatic when checked here today.  Frail, elderly.  Oral mucous members are dry.  Patient speaks slowly and deliberately.  He is lucid.  No respiratory distress.  Medical screening examination/treatment/procedure(s) were conducted as a shared visit with non-physician practitioner(s) and myself.  I personally evaluated the patient during the encounter   Daleen Bo, MD 03/04/18 [phone]

## 2018-03-03 NOTE — H&P (Signed)
History and Physical    Jesse Avila JSH:[id number] DOB: 03-13-1951 DOA: 03/03/2018  PCP: [name], MD  Patient coming from: Home  Chief Complaint: Weakness and abnormal lab  HPI: Jesse Avila is a 67 y.o. male with medical history significant of advanced lung cancer who has stopped all treatment, A. fib, hypertension who is not been eating and drinking well for several days whose got home health physical therapy range at home.  His wife reports that they have been asking for hospice referral for about 3 weeks now.  Looks like this was supposed to be set up from his last discharge but never was.  He has not had any nausea vomiting or diarrhea.  He has not had any fevers.  He has well-controlled pain at home.  Family just reporting that he is progressively getting weaker.  Patient had labs done with his primary care physician and he was sent to the emergency department because of a potassium level 3 and acute kidney injury with creatinine of 1.8.  Patient is referred for admission for dehydration.  Review of Systems: As per HPI otherwise 10 point review of systems negative.   Past Medical History:  Diagnosis Date  . Atrial fibrillation ([place])   . Carotid artery occlusion   . DJD (degenerative joint disease) of knee   . Essential hypertension, benign    takes Benicar daily  . Goals of care, counseling/discussion 09/18/2016  . History of colon polyps    benign  . History of gout   . History of radiation therapy 09/30/16-11/10/16   left lung 60 Gy in 30 fractions  . Hyperlipidemia    takes Fenofibrate and Crestor daily  . Insomnia    takes Ambien nightly as needed  . Joint pain   . Lung cancer ([place])   . Nocturia   . Peripheral vascular disease ([place])   . Primary localized osteoarthritis of left knee 12/26/2014  . Stroke [facility]) 2015?   takes Plavix daily as well as Pletal,not on plavix at present 05/15/15  . 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 ABDOMINAL AORTAGRAM N/A 03/29/2012   Procedure: ABDOMINAL Maxcine Ham;  Surgeon: Angelia Mould, MD;  Location: [facility] CATH LAB;  Service: Cardiovascular;  Laterality: N/A;  . CARDIOVERSION N/A 05/08/2017   Procedure: CARDIOVERSION;  Surgeon: Minna Merritts, MD;  Location: [facility] ORS;  Service: Cardiovascular;  Laterality: N/A;  . COLONOSCOPY    . ENDARTERECTOMY Right 05/09/2014   Procedure: RIGHT CAROTID ENDARTERECTOMY WITH PATCH ANGIOPLASTY;  Surgeon: Conrad Skidmore, MD;  Location: [place];  Service: Vascular;  Laterality: Right;  . ENDARTERECTOMY FEMORAL Right 05/17/2015   Procedure: ENDARTERECTOMY FEMORAL;  Surgeon: Angelia Mould, MD;  Location: [place];  Service: Vascular;  Laterality: Right;  . ENDOBRONCHIAL ULTRASOUND Bilateral 09/08/2016   Procedure: ENDOBRONCHIAL ULTRASOUND;  Surgeon: Rigoberto Noel, MD;  Location: [facility] ENDOSCOPY;  Service: Cardiopulmonary;  Laterality: Bilateral;  . EUS N/A 10/08/2017   Procedure: UPPER ENDOSCOPIC ULTRASOUND (EUS) LINEAR;  Surgeon: [name], MD;  Location: [facility] ENDOSCOPY;  Service: Endoscopy;  Laterality: N/A;  . FEMORAL-POPLITEAL BYPASS GRAFT Right 05/17/2015   Procedure: BYPASS GRAFT FEMORAL-BELOW KNEE POPLITEAL ARTERY, using gortex propaten graft 6 mm x 80 cm;  Surgeon: Angelia Mould, MD;  Location: [place];  Service: Vascular;  Laterality: Right;  .  IR GENERIC HISTORICAL  09/25/2016   IR FLUORO GUIDE PORT INSERTION RIGHT 09/25/2016 Markus Daft, MD [facility]-INTERV RAD  . IR GENERIC HISTORICAL  09/25/2016   IR US GUIDE VASC ACCESS RIGHT 09/25/2016 Markus Daft, MD [facility]INTER[facility]  . KNEE ARTHROSCOPY Right 11/2009  . LOWER EXTREMITY ANGIOGRAM Bilateral 03/23/2015   Procedure: Lower Extremity Angiogram;  Surgeon: Angelia Mould, MD;  Location: [place] CV LAB;  Service: Cardiovascular;  Laterality: Bilateral;  .  MENISCUS REPAIR  11/2009  . PARTIAL KNEE ARTHROPLASTY Left 12/26/2014   Procedure: UNICOMPARTMENTAL KNEE;  Surgeon: Marchia Bond, MD;  Location: [place];  Service: Orthopedics;  Laterality: Left;  . PATCH ANGIOPLASTY Right 05/17/2015   Procedure: VEIN PATCH ANGIOPLASTY ILEOFEMORAL ARTERY;  Surgeon: Angelia Mould, MD;  Location: [place];  Service: Vascular;  Laterality: Right;  . PERCUTANEOUS STENT INTERVENTION N/A 05/10/2012   Procedure: PERCUTANEOUS STENT INTERVENTION;  Surgeon: Angelia Mould, MD;  Location[facility] CATH LAB;  Service: Cardiovascular;  Laterality: N/A;  . PERIPHERAL VASCULAR CATHETERIZATION N/A 03/23/2015   Procedure: Abdominal Aortogram;  Surgeon: Angelia Mould, MD;  Location: [place] CV LAB;  Service: Cardiovascular;  Laterality: N/A;  . STENTS     PLACED IN ??BOTH LEGS   2013?  . TEE WITHOUT CARDIOVERSION N/A 03/06/2017   Procedure: TRANSESOPHAGEAL ECHOCARDIOGRAM (TEE);  Surgeon: Larey Dresser, MD;  Location: [facility] ENDOSCOPY;  Service: Cardiovascular;  Laterality: N/A;  . TEE WITHOUT CARDIOVERSION N/A 05/08/2017   Procedure: TRANSESOPHAGEAL ECHOCARDIOGRAM (TEE);  Surgeon: Minna Merritts, MD;  Location: [facility] ORS;  Service: Cardiovascular;  Laterality: N/A;     reports that he has quit smoking. His smoking use included cigarettes. He has a 20.00 pack-year smoking history. He has never used smokeless tobacco. He reports that he drinks alcohol. He reports that he does not use drugs.  Allergies  Allergen Reactions  . Cialis [Tadalafil] Other (See Comments)    Headache     Family History  Problem Relation Age of Onset  . Diabetes Mother   . Hypertension Mother   . Heart disease Mother        Coronary Artery Bypass Graft  . Hyperlipidemia Mother   . Heart attack Mother   . Hypertension Father   . Hyperlipidemia Sister   . Stroke Sister     Prior to Admission medications   Medication Sig Start Date End Date Taking? Authorizing Provider    acetaminophen (TYLENOL) 500 MG tablet Take 500 mg by mouth every 6 (six) hours as needed for moderate pain.   Yes [provider]  amiodarone (PACERONE) 200 MG tablet TAKE 1 TABLET BY MOUTH EVERY DAY 02/01/18  Yes Gollan, Kathlene November, MD  dexamethasone (DECADRON) 2 MG tablet Take 1 tablet (2 mg total) by mouth 2 (two) times daily. 02/08/18  Yes Hayden Pedro, PA-C  dronabinol (MARINOL) 2.5 MG capsule Take 1 capsule (2.5 mg total) by mouth 2 (two) times daily before a meal. 02/18/18  Yes Curt Bears, MD  HYDROcodone-acetaminophen (NORCO) 5-325 MG tablet Take 1 tablet by mouth every 6 (six) hours as needed for moderate pain. 02/25/18  Yes Curcio, Roselie Awkward, NP  insulin glargine (LANTUS) 100 UNIT/ML injection Inject 0.1 mLs (10 Units total) into the skin daily. Patient taking differently: Inject 15 Units into the skin at bedtime.  03/11/17  Yes Regalado, Belkys A, MD  lidocaine-prilocaine (EMLA) cream Apply 1 application topically as needed. 11/25/17  Yes Curcio, Roselie Awkward, NP  prochlorperazine (COMPAZINE) 10 MG tablet Take  1 tablet (10 mg total) by mouth every 6 (six) hours as needed for nausea or vomiting. 02/25/18  Yes Curcio, Roselie Awkward, NP  zolpidem (AMBIEN) 10 MG tablet Take 10 mg by mouth at bedtime.  01/26/15  Yes [provider]  furosemide (LASIX) 40 MG tablet TAKE 1 TABLET BY MOUTH TWICE A DAY Patient not taking: Reported on 03/03/2018 09/09/17   Minna Merritts, MD  KLOR-CON M20 20 MEQ tablet TAKE 1/2 TABLET (10MEQ) BY MOUTH DAILY. Patient not taking: Reported on 03/03/2018 02/07/18   Curt Bears, MD  lactulose [facility]) 10 GM/15ML solution Take 30 mLs (20 g total) by mouth daily as needed for mild constipation or moderate constipation. Patient not taking: Reported on 03/03/2018 12/16/17   Maryanna Shape, NP  lidocaine (XYLOCAINE) 2 % solution Use as directed 15 mLs in the mouth or throat 3 (three) times daily before meals. Patient not taking: Reported on 02/07/2018  11/13/17   Hosie Poisson, MD  ondansetron (ZOFRAN) 8 MG tablet Take 1 tablet (8 mg total) by mouth every 8 (eight) hours as needed for nausea or vomiting. Patient not taking: Reported on 03/03/2018 02/11/18   Curt Bears, MD  pantoprazole (PROTONIX) 40 MG tablet Take 1 tablet (40 mg total) by mouth 2 (two) times daily for 15 days. Patient taking differently: Take 40 mg by mouth 2 (two) times daily as needed (reflux).  11/13/17 02/15/18  Hosie Poisson, MD  polyethylene glycol (MIRALAX / GLYCOLAX) packet Take 17 g by mouth 2 (two) times daily. Patient not taking: Reported on 03/03/2018 02/18/18   Charlynne Cousins, MD  potassium chloride SA (K-DUR,KLOR-CON) 20 MEQ tablet Take 1 tablet (20 mEq total) by mouth 2 (two) times daily. Patient not taking: Reported on 02/15/2018 01/27/18   Maryanna Shape, NP  spironolactone (ALDACTONE) 25 MG tablet Take 1 tablet (25 mg total) by mouth daily. Patient not taking: Reported on 03/03/2018 03/11/17   Regalado, Jerald Kief A, MD  sucralfate (CARAFATE) 1 GM/10ML suspension Take 10 mLs (1 g total) by mouth 4 (four) times daily - after meals and at bedtime. Patient not taking: Reported on 01/28/2018 11/13/17   Hosie Poisson, MD    Physical Exam: Vitals:   03/03/18 1815 03/03/18 1823 03/03/18 1830 03/03/18 1900  BP:  111/67 (!) 103/53 122/71  Pulse:  69 70   Resp: 18 14 16 15   Temp:      TempSrc:      SpO2:  100% 98%   Weight:      Height:          Constitutional: NAD, calm, comfortable cachectic and dehydrated Vitals:   03/03/18 1815 03/03/18 1823 03/03/18 1830 03/03/18 1900  BP:  111/67 (!) 103/53 122/71  Pulse:  69 70   Resp: 18 14 16 15   Temp:      TempSrc:      SpO2:  100% 98%   Weight:      Height:       Eyes: PERRL, lids and conjunctivae normal ENMT: Mucous membranes are moist. Posterior pharynx clear of any exudate or lesions.Normal dentition.  Neck: normal, supple, no masses, no thyromegaly Respiratory: clear to auscultation bilaterally, no  wheezing, no crackles. Normal respiratory effort. No accessory muscle use.  Cardiovascular: Regular rate and rhythm, no murmurs / rubs / gallops. No extremity edema. 2+ pedal pulses. No carotid bruits.  Abdomen: no tenderness, no masses palpated. No hepatosplenomegaly. Bowel sounds positive.  Musculoskeletal: no clubbing / cyanosis. No joint deformity upper and  lower extremities. Good ROM, no contractures. Normal muscle tone.  Skin: no rashes, lesions, ulcers. No induration Neurologic: CN 2-12 grossly intact. Sensation intact, DTR normal. Strength 5/5 in all 4.  Psychiatric: Normal judgment and insight. Alert and oriented x 3. Normal mood.    Labs on Admission: I have personally reviewed following labs and imaging studies  CBC: Recent Labs  Lab 03/03/18 1603  WBC 2.6*  HGB 10.9*  HCT 31.8*  MCV 100.3*  PLT 36*   Basic Metabolic Panel: Recent Labs  Lab 03/03/18 1603  NA 139  K 3.0*  CL 104  CO2 23  GLUCOSE 98  BUN 40*  CREATININE 1.85*  CALCIUM 8.8*   GFR: Estimated Creatinine Clearance: 42.5 mL/min (A) (by C-G formula based on SCr of 1.85 mg/dL (H)). Liver Function Tests: Recent Labs  Lab 03/03/18 1603  AST 20  ALT 27  ALKPHOS 53  BILITOT 1.0  PROT 5.8*  ALBUMIN 3.0*   No results for input(s): LIPASE, AMYLASE in the last 168 hours. No results for input(s): AMMONIA in the last 168 hours. Coagulation Profile: No results for input(s): INR, PROTIME in the last 168 hours. Cardiac Enzymes: No results for input(s): CKTOTAL, CKMB, CKMBINDEX, TROPONINI in the last 168 hours. BNP (last 3 results) No results for input(s): PROBNP in the last 8760 hours. HbA1C: No results for input(s): HGBA1C in the last 72 hours. CBG: Recent Labs  Lab 03/03/18 1724  GLUCAP 81   Lipid Profile: No results for input(s): CHOL, HDL, LDLCALC, TRIG, CHOLHDL, LDLDIRECT in the last 72 hours. Thyroid Function Tests: No results for input(s): TSH, T4TOTAL, FREET4, T3FREE, THYROIDAB in the  last 72 hours. Anemia Panel: No results for input(s): VITAMINB12, FOLATE, FERRITIN, TIBC, IRON, RETICCTPCT in the last 72 hours. Urine analysis:    Component Value Date/Time   COLORURINE YELLOW 02/15/2018 1718   APPEARANCEUR CLOUDY (A) 02/15/2018 1718   LABSPEC 1.014 02/15/2018 1718   PHURINE 5.0 02/15/2018 1718   GLUCOSEU NEGATIVE 02/15/2018 1718   HGBUR MODERATE (A) 02/15/2018 1718   BILIRUBINUR NEGATIVE 02/15/2018 1718   KETONESUR NEGATIVE 02/15/2018 1718   PROTEINUR 30 (A) 02/15/2018 1718   UROBILINOGEN 0.2 05/15/2015 0900   NITRITE NEGATIVE 02/15/2018 1718   LEUKOCYTESUR NEGATIVE 02/15/2018 1718   Sepsis Labs: !!!!!!!!!!!!!!!!!!!!!!!!!!!!!!!!!!!!!!!!!!!! @LABRCNTIP (procalcitonin:4,lacticidven:4) )No results found for this or any previous visit (from the past 240 hour(s)).   Radiological Exams on Admission: Dg Chest 2 View  Result Date: 03/03/2018 CLINICAL DATA:  Weakness EXAM: CHEST - 2 VIEW COMPARISON:  CT 01/25/2018, 07/17/2017 radiograph FINDINGS: Right-sided central venous port tip overlies the SVC. Right lung is clear. Post treatment changes in the left upper lung. Hilar retraction on the left. Increasing opacity at the left lung apex. Normal heart size. No pneumothorax. IMPRESSION: Increased opacity within the left apical lung, some of which is due to post radiation change. There may be an acute superimposed pneumonia in this region. Electronically Signed   By: Donavan Foil M.D.   On: 03/03/2018 19:25    Old chart reviewed  Case discussed with EDP Ms. Valere Dross   Assessment/Plan 67 year old male with advanced lung cancer comes in with failure to thrive and dehydration with acute kidney injury Principal Problem:   Dehydration patient orthostatic in the ED.  Provide IV fluids overnight.  During my attempt to discuss hospice and answer the wife's questions the daughter got very upset because they want more of a positive discussion and outlook.  Seems the family has poor  insight on to  what is going on.  Not clear why hospice is not been arranged at home.  Will obtain case management consultation and possibly we can expedient this.  But is not quite clear that the family is on board with hospice as they do not really want to talk about goals of care in front of the patient.  Active Problems:   AKI (acute kidney injury) (HCC)-creatinine bump up to 1.8 from 1.2.  IV fluids.  Repeat in the morning.    Orthostatic hypotension-secondary to volume depletion.  IV fluids.    PAD (peripheral artery disease) (HCC)-stable    COPD (chronic obstructive pulmonary disease) (HCC)-stable    Atrial fibrillation (HCC)-currently rate controlled continue home meds    Malignant neoplasm of lung (HCC) end-stage not currently undergoing any treatment and no plans in the future of receiving any further palliative treatment-    Chronic diastolic CHF (congestive heart failure) (HCC) hold diuretics at this time currently-dehydrated    Hypokalemia-replete through IV    CKD (chronic kidney disease) stage 4, GFR 15-29 ml/min (HCC)-as above   Continue further discussions about goals at the end of life.  Did verify that patient is DNR.  Wife was very open to discussing these matters however the daughter got quite upset about hospice discussion and CODE STATUS discussion.   DVT prophylaxis: SCDs Code Status: DNR Family Communication: Wife and daughter Disposition Plan: Likely tomorrow Consults called: None Admission status: Observation   Jacobus Colvin A MD [facility]  If 7PM-7AM, please contact night-coverage [email or web address] Password [facility]  03/03/2018, 7:44 PM

## 2018-03-03 NOTE — ED Notes (Signed)
ED TO INPATIENT HANDOFF REPORT  Name/Age/Gender Jesse Avila 67 y.o. male  Code Status    Code Status Orders  (From admission, onward)        Start     Ordered   03/03/18 1951  Do not attempt resuscitation (DNR)  Continuous    Question Answer Comment  In the event of cardiac or respiratory ARREST Do not call a "code blue"   In the event of cardiac or respiratory ARREST Do not perform Intubation, CPR, defibrillation or ACLS   In the event of cardiac or respiratory ARREST Use medication by any route, position, wound care, and other measures to relive pain and suffering. May use oxygen, suction and manual treatment of airway obstruction as needed for comfort.      03/03/18 1951    Code Status History    Date Active Date Inactive Code Status Order ID Comments User Context   02/15/2018 2254 02/18/2018 1902 DNR [id number]  Rise Patience, MD ED   11/09/2017 2000 11/13/2017 1801 Full Code [id number]  Norval Morton, MD ED   03/01/2017 0459 03/10/2017 1844 Full Code [id number]  Etta Quill, DO ED   02/02/2017 1756 02/05/2017 2012 Full Code [id number]  Reubin Milan, MD Inpatient   10/21/2016 2204 10/22/2016 2127 Full Code [id number]  Rama, Venetia Maxon, MD Inpatient   05/17/2015 2210 05/20/2015 1649 Full Code [id number]  Gabriel Earing, PA-C Inpatient   03/23/2015 1434 03/23/2015 2235 Full Code [id number]  Angelia Mould, MD Inpatient   12/26/2014 1409 12/27/2014 1619 Full Code [id number]  Marchia Bond, MD Inpatient   05/09/2014 1424 05/10/2014 1322 Full Code [id number]  Orbie Hurst Inpatient   04/14/2014 1703 04/16/2014 1759 Full Code [id number]  Louellen Molder, MD Inpatient      Home/SNF/Other Home  Chief Complaint Sent by PCP; Weakness; Abnormal Labs  Level of Care/Admitting Diagnosis ED Disposition    ED Disposition Condition Comment   Admit  Hospital Area: [facility] [[id number]]  Level of Care: Med-Surg [16]  Diagnosis: Dehydration  [276.51.ICD-9-CM]  Admitting Physician: Phillips Grout [[id number]]  Attending Physician: Derrill Kay A [[id number]]  PT Class (Do Not Modify): Observation [104]  PT Acc Code (Do Not Modify): Observation [10022]       Medical History Past Medical History:  Diagnosis Date  . Atrial fibrillation ([place])   . Carotid artery occlusion   . DJD (degenerative joint disease) of knee   . Essential hypertension, benign    takes Benicar daily  . Goals of care, counseling/discussion 09/18/2016  . History of colon polyps    benign  . History of gout   . History of radiation therapy 09/30/16-11/10/16   left lung 60 Gy in 30 fractions  . Hyperlipidemia    takes Fenofibrate and Crestor daily  . Insomnia    takes Ambien nightly as needed  . Joint pain   . Lung cancer ([place])   . Nocturia   . Peripheral vascular disease ([place])   . Primary localized osteoarthritis of left knee 12/26/2014  . Stroke [facility]) 2015?   takes Plavix daily as well as Pletal,not on plavix at present 05/15/15  . 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 Cialis [Tadalafil] Other (See Comments)    Headache     IV Location/Drains/Wounds Patient Lines/Drains/Airways Status   Active  Line/Drains/Airways    Name:   Placement date:   Placement time:   Site:   Days:   Implanted Port 09/25/16 Right Chest   09/25/16    1024    Chest   524   Wound / Incision (Open or Dehisced) 02/15/18 Non-pressure wound;Other (Comment) Arm Anterior;Lower;Proximal;Right   02/15/18    2305    Arm   16   Wound / Incision (Open or Dehisced) 02/15/18 Non-pressure wound;Other (Comment) Arm Left;Lower;Posterior;Proximal   02/15/18    2305    Arm   16   Wound / Incision (Open or Dehisced) 02/15/18 Non-pressure wound;Other (Comment) Arm Distal;Left;Lower;Posterior   02/15/18    2305    Arm   16           Labs/Imaging Results for orders placed or performed during the hospital encounter of 03/03/18 (from the past 48 hour(s))  Basic metabolic panel     Status: Abnormal   Collection Time: 03/03/18  4:03 PM  Result Value Ref Range   Sodium 139 135 - 145 mmol/L   Potassium 3.0 (L) 3.5 - 5.1 mmol/L   Chloride 104 98 - 111 mmol/L   CO2 23 22 - 32 mmol/L   Glucose, Bld 98 70 - 99 mg/dL   BUN 40 (H) 8 - 23 mg/dL   Creatinine, Ser 1.85 (H) 0.61 - 1.24 mg/dL   Calcium 8.8 (L) 8.9 - 10.3 mg/dL   GFR calc non Af Amer 37 (L) >60 mL/min   GFR calc Af Amer 43 (L) >60 mL/min    Comment: (NOTE) The eGFR has been calculated using the CKD EPI equation. This calculation has not been validated in all clinical situations. eGFR's persistently <60 mL/min signify possible Chronic Kidney Disease.    Anion gap 12 5 - 15    Comment: Performed at [facility], [place] [address], [place], [place] [address]  CBC     Status: Abnormal   Collection Time: 03/03/18  4:03 PM  Result Value Ref Range   WBC 2.6 (L) 4.0 - 10.5 K/uL   RBC 3.17 (L) 4.22 - 5.81 MIL/uL   Hemoglobin 10.9 (L) 13.0 - 17.0 g/dL   HCT 31.8 (L) 39.0 - 52.0 %   MCV 100.3 (H) 78.0 - 100.0 fL   MCH 34.4 (H) 26.0 - 34.0 pg   MCHC 34.3 30.0 - 36.0 g/dL   RDW 15.1 11.5 - 15.5 %   Platelets 36 (L) 150 - 400 K/uL    Comment: REPEATED TO VERIFY SPECIMEN CHECKED FOR CLOTS PLATELET COUNT CONFIRMED BY SMEAR Performed at [facility] [address], [place], [place] [address]   Hepatic function panel     Status: Abnormal   Collection Time: 03/03/18  4:03 PM  Result Value Ref Range   Total Protein 5.8 (L) 6.5 - 8.1 g/dL   Albumin 3.0 (L) 3.5 - 5.0 g/dL   AST 20 15 - 41 U/L   ALT 27 0 - 44 U/L   Alkaline Phosphatase 53 38 - 126 U/L   Total Bilirubin 1.0 0.3 - 1.2 mg/dL   Bilirubin, Direct 0.3 (H) 0.0 - 0.2 mg/dL   Indirect Bilirubin 0.7 0.3 - 0.9 mg/dL    Comment: Performed at [facility],  [place] [address] [place], [place] [address]  CBG monitoring, ED     Status: None   Collection Time: 03/03/18  5:24 PM  Result Value Ref Range   Glucose-Capillary 81 70 - 99 mg/dL  Type and screen [place]     Status: None   Collection Time: 03/03/18  6:39 PM  Result Value Ref Range   ABO/RH(D) O POS    Antibody Screen NEG    Sample Expiration      03/06/2018 Performed at [facility], [place] [address] [place], [place] [address]    Dg Chest 2 View  Result Date: 03/03/2018 CLINICAL DATA:  Weakness EXAM: CHEST - 2 VIEW COMPARISON:  CT 01/25/2018, 07/17/2017 radiograph FINDINGS: Right-sided central venous port tip overlies the SVC. Right lung is clear. Post treatment changes in the left upper lung. Hilar retraction on the left. Increasing opacity at the left lung apex. Normal heart size. No pneumothorax. IMPRESSION: Increased opacity within the left apical lung, some of which is due to post radiation change. There may be an acute superimposed pneumonia in this region. Electronically Signed   By: Donavan Foil M.D.   On: 03/03/2018 19:25    Pending Labs Unresulted Labs (From admission, onward)   Start     Ordered   03/04/18 5284  Basic metabolic panel  Tomorrow morning,   R     03/03/18 1951   03/04/18 0500  CBC  Tomorrow morning,   R     03/03/18 1951      Vitals/Pain Today's Vitals   03/03/18 1815 03/03/18 1823 03/03/18 1830 03/03/18 1900  BP:  111/67 (!) 103/53 122/71  Pulse:  69 70   Resp: '18 14 16 15  '$ Temp:      TempSrc:      SpO2:  100% 98%   Weight:      Height:      PainSc:        Isolation Precautions No active isolations  Medications Medications  zolpidem (AMBIEN) tablet 10 mg (has no administration in time range)  amiodarone (PACERONE) tablet 200 mg (has no administration in time range)  dexamethasone (DECADRON) tablet 2 mg (has no administration in time range)  dronabinol (MARINOL) capsule 2.5 mg (has no administration  in time range)  prochlorperazine (COMPAZINE) tablet 10 mg (has no administration in time range)  HYDROcodone-acetaminophen (NORCO/VICODIN) 5-325 MG per tablet 1 tablet (has no administration in time range)  acetaminophen (TYLENOL) tablet 500 mg (has no administration in time range)  0.9 % NaCl with KCl 40 mEq / L  infusion (has no administration in time range)  potassium chloride 10 mEq in 100 mL IVPB (10 mEq Intravenous New Bag/Given 03/03/18 1835)  potassium chloride SA (K-DUR,KLOR-CON) CR tablet 40 mEq (40 mEq Oral Given 03/03/18 1723)  sodium chloride 0.9 % bolus 1,000 mL ( Intravenous Stopped 03/03/18 1836)    Mobility Walker, cane and wheel chair

## 2018-03-03 NOTE — ED Triage Notes (Signed)
Patient sent here by PCP for complaints of weakness. Cancer patient.

## 2018-03-03 NOTE — ED Provider Notes (Signed)
[facility] DEPT Provider Note   CSN: [id number] Arrival date & time: 03/03/18  1455     History   Chief Complaint Chief Complaint  Patient presents with  . Weakness    HPI Jesse Avila is a 67 y.o. male.  HPI  Patient is a 50 male with a history of stage IV lung cancer, currently stopping all treatments, CVA, atrial fibrillation, gout, hyperlipidemia presenting for weakness and low appetite.  Patient presents with his wife and family, who assist in history taking.  Per the patient and his wife, since leaving the hospital, he has had little energy to do anything over the past 7 to 10 days.  Patient recently had advanced home care services provided at home, and he has a hospice consultation tomorrow.  Patient is not currently receiving any IV therapy or nutritional support at home.  Patient had lab work drawn that was sent to his primary care provider, who called the family back to state that he had "low values", and to come to the hospital.  Patient reports pain in his lower back, that has been chronic at times several weeks to months, but is not increased at present.  Patient feels no acute weakness or numbness in his lower extremities.  Patient has been given multiple enemas at home, that it produced watery diarrhea, incontinence at times, and no urinary retention with overflow incontinence.  Patient denies any rectal bleeding.  Past Medical History:  Diagnosis Date  . Atrial fibrillation ([place])   . Carotid artery occlusion   . DJD (degenerative joint disease) of knee   . Essential hypertension, benign    takes Benicar daily  . Goals of care, counseling/discussion 09/18/2016  . History of colon polyps    benign  . History of gout   . History of radiation therapy 09/30/16-11/10/16   left lung 60 Gy in 30 fractions  . Hyperlipidemia    takes Fenofibrate and Crestor daily  . Insomnia    takes Ambien nightly as needed  . Joint pain   . Lung cancer  ([place])   . Nocturia   . Peripheral vascular disease ([place])   . Primary localized osteoarthritis of left knee 12/26/2014  . Stroke [facility]) 2015?   takes Plavix daily as well as Pletal,not on plavix at present 05/15/15  . 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 Antineoplastic chemotherapy induced anemia 02/16/2018  . Hyponatremia 02/16/2018  . Orthostatic hypotension 02/16/2018  . Protein-calorie malnutrition, severe 02/16/2018  . Weakness 02/15/2018  . CKD (chronic kidney disease) stage 4, GFR 15-29 ml/min (HCC) 02/15/2018  . Brain metastasis ([place]) 01/27/2018  . Encounter for antineoplastic immunotherapy 12/16/2017  . Hypotension 11/10/2017  . Hypokalemia 11/10/2017  . Thrombocytopenia ([place]) 11/10/2017  . Chemotherapy induced neutropenia ([place]) 11/10/2017  . Symptomatic anemia 11/09/2017  . Dehydration 10/19/2017  . Pancreatic mass   . Thrombus of left atrial appendage following myocardial infarction ([place])   . AKI (acute kidney injury) ([place]) 03/05/2017  . HCAP (healthcare-associated pneumonia)   . Transaminitis   . Pleural effusion on left   . Postobstructive pneumonia 03/01/2017  . Lactic acidosis 03/01/2017  . Acute on chronic diastolic CHF (congestive heart failure) ([place]) 02/02/2017  . Port catheter in place 11/05/2016  . Malignant neoplasm of lung ([place])   . Anemia   .  Atrial fibrillation ([place]) 10/21/2016  . Small cell lung carcinoma, left ([place]) 09/18/2016  . Goals of care, counseling/discussion 09/18/2016  . Encounter for antineoplastic chemotherapy 09/18/2016  . Hilar lymphadenopathy   . COPD (chronic obstructive pulmonary disease) ([place]) 09/03/2016  . PAD (peripheral artery disease) ([place]) 05/17/2015  . Primary localized osteoarthritis of left knee 12/26/2014  . Knee osteoarthritis 12/26/2014  . Carotid  stenosis 04/21/2014  . History of stroke 04/21/2014  . Carotid stenosis with cerebral infarction less than 8 weeks ago 04/16/2014  . Diabetes mellitus, type 2 ([place]) 04/14/2014  . Obesity 04/14/2014  . Insomnia 10/18/2013  . Preop cardiovascular exam 12/12/2011  . DJD (degenerative joint disease) of knee   . ED (erectile dysfunction)   . GERD (gastroesophageal reflux disease)   . Essential hypertension, benign   . Peripheral vascular disease ([place])   . Hyperlipidemia   . Critical lower limb ischemia 11/19/2011    Past Surgical History:  Procedure Laterality Date  . ABDOMINAL AORTAGRAM N/A 03/29/2012   Procedure: ABDOMINAL Maxcine Ham;  Surgeon: Angelia Mould, MD;  Location: [facility] CATH LAB;  Service: Cardiovascular;  Laterality: N/A;  . CARDIOVERSION N/A 05/08/2017   Procedure: CARDIOVERSION;  Surgeon: Minna Merritts, MD;  Location: [facility] ORS;  Service: Cardiovascular;  Laterality: N/A;  . COLONOSCOPY    . ENDARTERECTOMY Right 05/09/2014   Procedure: RIGHT CAROTID ENDARTERECTOMY WITH PATCH ANGIOPLASTY;  Surgeon: Conrad Perla, MD;  Location: [place];  Service: Vascular;  Laterality: Right;  . ENDARTERECTOMY FEMORAL Right 05/17/2015   Procedure: ENDARTERECTOMY FEMORAL;  Surgeon: Angelia Mould, MD;  Location: [facility];  Service: Vascular;  Laterality: Right;  . ENDOBRONCHIAL ULTRASOUND Bilateral 09/08/2016   Procedure: ENDOBRONCHIAL ULTRASOUND;  Surgeon: Rigoberto Noel, MD;  Location: [facility] ENDOSCOPY;  Service: Cardiopulmonary;  Laterality: Bilateral;  . EUS N/A 10/08/2017   Procedure: UPPER ENDOSCOPIC ULTRASOUND (EUS) LINEAR;  Surgeon: [name], MD;  Location: [facility] ENDOSCOPY;  Service: Endoscopy;  Laterality: N/A;  . FEMORAL-POPLITEAL BYPASS GRAFT Right 05/17/2015   Procedure: BYPASS GRAFT FEMORAL-BELOW KNEE POPLITEAL ARTERY, using gortex propaten graft 6 mm x 80 cm;  Surgeon: Angelia Mould, MD;  Location: [place];  Service: Vascular;  Laterality: Right;  . IR GENERIC HISTORICAL   09/25/2016   IR FLUORO GUIDE PORT INSERTION RIGHT 09/25/2016 Markus Daft, MD [facility]-INTERV RAD  . IR GENERIC HISTORICAL  09/25/2016   IR US GUIDE VASC ACCESS RIGHT 09/25/2016 Markus Daft, MD [facility]-INTER[facility] RAD  . KNEE ARTHROSCOPY Right 11/2009  . LOWER EXTREMITY ANGIOGRAM Bilateral 03/23/2015   Procedure: Lower Extremity Angiogram;  Surgeon: Angelia Mould, MD;  Location: [place] CV LAB;  Service: Cardiovascular;  Laterality: Bilateral;  . MENISCUS REPAIR  11/2009  . PARTIAL KNEE ARTHROPLASTY Left 12/26/2014   Procedure: UNICOMPARTMENTAL KNEE;  Surgeon: Marchia Bond, MD;  Location: [facility];  Service: Orthopedics;  Laterality: Left;  . PATCH ANGIOPLASTY Right 05/17/2015   Procedure: VEIN PATCH ANGIOPLASTY ILEOFEMORAL ARTERY;  Surgeon: Angelia Mould, MD;  Location: [place];  Service: Vascular;  Laterality: Right;  . PERCUTANEOUS STENT INTERVENTION N/A 05/10/2012   Procedure: PERCUTANEOUS STENT INTERVENTION;  Surgeon: Angelia Mould, MD;  Location: [facility] CATH LAB;  Service: Cardiovascular;  Laterality: N/A;  . PERIPHERAL VASCULAR CATHETERIZATION N/A 03/23/2015   Procedure: Abdominal Aortogram;  Surgeon: Angelia Mould, MD;  Location: [place] CV LAB;  Service: Cardiovascular;  Laterality: N/A;  . STENTS     PLACED IN ??BOTH LEGS   2013?  . TEE WITHOUT CARDIOVERSION  N/A 03/06/2017   Procedure: TRANSESOPHAGEAL ECHOCARDIOGRAM (TEE);  Surgeon: Larey Dresser, MD;  Location[facility] ENDOSCOPY;  Service: Cardiovascular;  Laterality: N/A;  . TEE WITHOUT CARDIOVERSION N/A 05/08/2017   Procedure: TRANSESOPHAGEAL ECHOCARDIOGRAM (TEE);  Surgeon: Minna Merritts, MD;  Location: [facility] ORS;  Service: Cardiovascular;  Laterality: N/A;        Home Medications    Prior to Admission medications   Medication Sig Start Date End Date Taking? Authorizing Provider  acetaminophen (TYLENOL) 500 MG tablet Take 500 mg by mouth every 6 (six) hours as needed for moderate pain.   Yes [provider]    amiodarone (PACERONE) 200 MG tablet TAKE 1 TABLET BY MOUTH EVERY DAY 02/01/18  Yes Gollan, Kathlene November, MD  dexamethasone (DECADRON) 2 MG tablet Take 1 tablet (2 mg total) by mouth 2 (two) times daily. 02/08/18  Yes Hayden Pedro, PA-C  dronabinol (MARINOL) 2.5 MG capsule Take 1 capsule (2.5 mg total) by mouth 2 (two) times daily before a meal. 02/18/18  Yes Curt Bears, MD  HYDROcodone-acetaminophen (NORCO) 5-325 MG tablet Take 1 tablet by mouth every 6 (six) hours as needed for moderate pain. 02/25/18  Yes Curcio, Roselie Awkward, NP  insulin glargine (LANTUS) 100 UNIT/ML injection Inject 0.1 mLs (10 Units total) into the skin daily. Patient taking differently: Inject 15 Units into the skin at bedtime.  03/11/17  Yes Regalado, Belkys A, MD  lidocaine-prilocaine (EMLA) cream Apply 1 application topically as needed. 11/25/17  Yes Curcio, Roselie Awkward, NP  prochlorperazine (COMPAZINE) 10 MG tablet Take 1 tablet (10 mg total) by mouth every 6 (six) hours as needed for nausea or vomiting. 02/25/18  Yes Curcio, Roselie Awkward, NP  zolpidem (AMBIEN) 10 MG tablet Take 10 mg by mouth at bedtime.  01/26/15  Yes [provider]  furosemide (LASIX) 40 MG tablet TAKE 1 TABLET BY MOUTH TWICE A DAY Patient not taking: Reported on 03/03/2018 09/09/17   Minna Merritts, MD  KLOR-CON M20 20 MEQ tablet TAKE 1/2 TABLET (10MEQ) BY MOUTH DAILY. Patient not taking: Reported on 03/03/2018 02/07/18   Curt Bears, MD  lactulose [facility]) 10 GM/15ML solution Take 30 mLs (20 g total) by mouth daily as needed for mild constipation or moderate constipation. Patient not taking: Reported on 03/03/2018 12/16/17   Maryanna Shape, NP  lidocaine (XYLOCAINE) 2 % solution Use as directed 15 mLs in the mouth or throat 3 (three) times daily before meals. Patient not taking: Reported on 02/07/2018 11/13/17   Hosie Poisson, MD  ondansetron (ZOFRAN) 8 MG tablet Take 1 tablet (8 mg total) by mouth every 8 (eight) hours as needed for nausea  or vomiting. Patient not taking: Reported on 03/03/2018 02/11/18   Curt Bears, MD  pantoprazole (PROTONIX) 40 MG tablet Take 1 tablet (40 mg total) by mouth 2 (two) times daily for 15 days. Patient taking differently: Take 40 mg by mouth 2 (two) times daily as needed (reflux).  11/13/17 02/15/18  Hosie Poisson, MD  polyethylene glycol (MIRALAX / GLYCOLAX) packet Take 17 g by mouth 2 (two) times daily. Patient not taking: Reported on 03/03/2018 02/18/18   Charlynne Cousins, MD  potassium chloride SA (K-DUR,KLOR-CON) 20 MEQ tablet Take 1 tablet (20 mEq total) by mouth 2 (two) times daily. Patient not taking: Reported on 02/15/2018 01/27/18   Maryanna Shape, NP  spironolactone (ALDACTONE) 25 MG tablet Take 1 tablet (25 mg total) by mouth daily. Patient not taking: Reported on 03/03/2018 03/11/17   Regalado,  Belkys A, MD  sucralfate (CARAFATE) 1 GM/10ML suspension Take 10 mLs (1 g total) by mouth 4 (four) times daily - after meals and at bedtime. Patient not taking: Reported on 01/28/2018 11/13/17   Hosie Poisson, MD    Family History Family History  Problem Relation Age of Onset  . Diabetes Mother   . Hypertension Mother   . Heart disease Mother        Coronary Artery Bypass Graft  . Hyperlipidemia Mother   . Heart attack Mother   . Hypertension Father   . Hyperlipidemia Sister   . Stroke Sister     Social History Social History   Tobacco Use  . Smoking status: Former Smoker    Packs/day: 1.00    Years: 20.00    Pack years: 20.00    Types: Cigarettes  . Smokeless tobacco: Never Used  . Tobacco comment: quit smoking 2008  Substance Use Topics  . Alcohol use: Yes    Alcohol/week: 0.0 oz    Comment: beer- occasionally  . Drug use: No     Allergies   Cialis [tadalafil]   Review of Systems Review of Systems  Constitutional: Negative for chills and fever.  Respiratory: Positive for cough. Negative for shortness of breath.   Cardiovascular: Negative for chest pain and leg  swelling.  Musculoskeletal: Positive for arthralgias and myalgias.  Skin: Negative for wound.  Neurological: Positive for weakness. Negative for numbness.  Hematological: Bruises/bleeds easily.  All other systems reviewed and are negative.    Physical Exam Updated Vital Signs BP 111/67   Pulse 69   Temp 98.3 F (36.8 C) (Oral)   Resp 14   Ht 6\' 1"  (1.854 m)   Wt 74.4 kg (164 lb)   SpO2 100%   BMI 21.64 kg/m   Physical Exam  Constitutional:  Thin, cachectic, and chronically ill-appearing.  HENT:  Head: Normocephalic and atraumatic.  Mouth/Throat: Oropharynx is clear and moist.  Eyes: Pupils are equal, round, and reactive to light. Conjunctivae and EOM are normal. No scleral icterus.  Neck: Normal range of motion. Neck supple.  Cardiovascular: Normal rate, regular rhythm, S1 normal and S2 normal.  No murmur heard. Pulmonary/Chest: Effort normal.  Diminished breath sounds.  No rales or wheezes.  Abdominal: Soft. He exhibits no distension. There is no tenderness. There is no guarding.  Musculoskeletal: Normal range of motion. He exhibits no edema or deformity.  Neurological: He is alert.  Cranial nerves grossly intact. Patient moves extremities symmetrically and with good coordination.  Skin: Skin is warm and dry.  Diffuse ecchymosis and purpura in all extremities and abdomen.  Psychiatric: He has a normal mood and affect. His behavior is normal. Judgment and thought content normal.  Nursing note and vitals reviewed.    ED Treatments / Results  Labs (all labs ordered are listed, but only abnormal results are displayed) Labs Reviewed  BASIC METABOLIC PANEL - Abnormal; Notable for the following components:      Result Value   Potassium 3.0 (*)    BUN 40 (*)    Creatinine, Ser 1.85 (*)    Calcium 8.8 (*)    GFR calc non Af Amer 37 (*)    GFR calc Af Amer 43 (*)    All other components within normal limits  CBC - Abnormal; Notable for the following components:    WBC 2.6 (*)    RBC 3.17 (*)    Hemoglobin 10.9 (*)    HCT 31.8 (*)  MCV 100.3 (*)    MCH 34.4 (*)    Platelets 36 (*)    All other components within normal limits  HEPATIC FUNCTION PANEL - Abnormal; Notable for the following components:   Total Protein 5.8 (*)    Albumin 3.0 (*)    Bilirubin, Direct 0.3 (*)    All other components within normal limits  URINALYSIS, ROUTINE W REFLEX MICROSCOPIC  CBG MONITORING, ED  TYPE AND SCREEN    EKG EKG Interpretation  Date/Time:  Wednesday March 03 2018 15:23:34 EDT Ventricular Rate:  89 PR Interval:    QRS Duration: 155 QT Interval:  457 QTC Calculation: 557 R Axis:   -52 Text Interpretation:  Sinus or ectopic atrial rhythm Nonspecific IVCD with LAD Anterior infarct, old Abnormal T, consider ischemia, lateral leads no change from previos Confirmed by Charlesetta Shanks [phone]) on 03/03/2018 3:35:17 PM   Radiology No results found.  Procedures Procedures (including critical care time)  Medications Ordered in ED Medications  potassium chloride 10 mEq in 100 mL IVPB (10 mEq Intravenous New Bag/Given 03/03/18 1835)  potassium chloride SA (K-DUR,KLOR-CON) CR tablet 40 mEq (40 mEq Oral Given 03/03/18 1723)  sodium chloride 0.9 % bolus 1,000 mL ( Intravenous Stopped 03/03/18 1836)     Initial Impression / Assessment and Plan / ED Course  I have reviewed the triage vital signs and the nursing notes.  Pertinent labs & imaging results that were available during my care of the patient were reviewed by me and considered in my medical decision making (see chart for details).  Clinical Course as of Mar 03 1940  Wed Mar 03, 2018  1941 Spoke with Dr. Shanon Brow regarding admission for patient.  I appreciate her involvement in the care of this patient.   [AM]    Clinical Course User Index [AM] Albesa Seen, PA-C    Patient is chronically ill-appearing, with little energy.  Patient is having difficulty even to speak due to low energy.    Critical values include hypokalemia of 3.0, and thrombocytopenia at 36.  Given the patient has no active bleeding, will refrain from giving platelets at this time, will type and screen patient.  Hemoglobin stable.  Patient does have AKI, with elevated BUN suggestive of dehydration.  We will proceed to admit.  Patient does have evidence of orthostasis, as his blood pressure quickly drops when he goes from a lying to sitting position patient is too weak to stand.. Suggestive of dehydration.  Given the patient has no resources at home for fluid support intravenously, will seek admission.  Appears that patient's family both desiring fluid support and physical therapy, which are palliative resources, but also pursuing hospice consult.  Since patient is not a hospice patient at this time, will continue to pursue medical management.  This is a shared visit with Dr. Daleen Bo. Patient was independently evaluated by this attending physician. Attending physician consulted in evaluation and management.  Final Clinical Impressions(s) / ED Diagnoses   Final diagnoses:  AKI (acute kidney injury) ([place])  Failure to thrive (0-17)  Thrombocytopenia ([place])  Neutropenia, unspecified type [facility])    ED Discharge Orders    None       Tamala Julian 03/03/18 1944    Daleen Bo, MD 03/04/18 0006

## 2018-03-03 NOTE — ED Notes (Signed)
Patient request lab draw from port.

## 2018-03-04 ENCOUNTER — Other Ambulatory Visit: Payer: Self-pay | Admitting: Cardiovascular Disease

## 2018-03-04 DIAGNOSIS — I480 Paroxysmal atrial fibrillation: Secondary | ICD-10-CM | POA: Diagnosis present

## 2018-03-04 DIAGNOSIS — C3492 Malignant neoplasm of unspecified part of left bronchus or lung: Secondary | ICD-10-CM | POA: Diagnosis present

## 2018-03-04 DIAGNOSIS — E1122 Type 2 diabetes mellitus with diabetic chronic kidney disease: Secondary | ICD-10-CM | POA: Diagnosis present

## 2018-03-04 DIAGNOSIS — E860 Dehydration: Secondary | ICD-10-CM | POA: Diagnosis not present

## 2018-03-04 DIAGNOSIS — M1712 Unilateral primary osteoarthritis, left knee: Secondary | ICD-10-CM | POA: Diagnosis present

## 2018-03-04 DIAGNOSIS — N184 Chronic kidney disease, stage 4 (severe): Secondary | ICD-10-CM | POA: Diagnosis present

## 2018-03-04 DIAGNOSIS — I951 Orthostatic hypotension: Secondary | ICD-10-CM | POA: Diagnosis present

## 2018-03-04 DIAGNOSIS — N179 Acute kidney failure, unspecified: Secondary | ICD-10-CM | POA: Diagnosis not present

## 2018-03-04 DIAGNOSIS — C7889 Secondary malignant neoplasm of other digestive organs: Secondary | ICD-10-CM | POA: Diagnosis present

## 2018-03-04 DIAGNOSIS — Z66 Do not resuscitate: Secondary | ICD-10-CM | POA: Diagnosis present

## 2018-03-04 DIAGNOSIS — G4700 Insomnia, unspecified: Secondary | ICD-10-CM | POA: Diagnosis present

## 2018-03-04 DIAGNOSIS — E785 Hyperlipidemia, unspecified: Secondary | ICD-10-CM | POA: Diagnosis present

## 2018-03-04 DIAGNOSIS — M109 Gout, unspecified: Secondary | ICD-10-CM | POA: Diagnosis present

## 2018-03-04 DIAGNOSIS — C7951 Secondary malignant neoplasm of bone: Secondary | ICD-10-CM | POA: Diagnosis present

## 2018-03-04 DIAGNOSIS — R531 Weakness: Secondary | ICD-10-CM | POA: Diagnosis present

## 2018-03-04 DIAGNOSIS — I5032 Chronic diastolic (congestive) heart failure: Secondary | ICD-10-CM | POA: Diagnosis present

## 2018-03-04 DIAGNOSIS — C3401 Malignant neoplasm of right main bronchus: Secondary | ICD-10-CM | POA: Diagnosis not present

## 2018-03-04 DIAGNOSIS — G8929 Other chronic pain: Secondary | ICD-10-CM | POA: Diagnosis present

## 2018-03-04 DIAGNOSIS — D61818 Other pancytopenia: Secondary | ICD-10-CM | POA: Diagnosis present

## 2018-03-04 DIAGNOSIS — I130 Hypertensive heart and chronic kidney disease with heart failure and stage 1 through stage 4 chronic kidney disease, or unspecified chronic kidney disease: Secondary | ICD-10-CM | POA: Diagnosis present

## 2018-03-04 DIAGNOSIS — E876 Hypokalemia: Secondary | ICD-10-CM | POA: Diagnosis present

## 2018-03-04 DIAGNOSIS — J449 Chronic obstructive pulmonary disease, unspecified: Secondary | ICD-10-CM | POA: Diagnosis present

## 2018-03-04 DIAGNOSIS — E1151 Type 2 diabetes mellitus with diabetic peripheral angiopathy without gangrene: Secondary | ICD-10-CM | POA: Diagnosis present

## 2018-03-04 DIAGNOSIS — R627 Adult failure to thrive: Secondary | ICD-10-CM | POA: Diagnosis present

## 2018-03-04 DIAGNOSIS — D696 Thrombocytopenia, unspecified: Secondary | ICD-10-CM | POA: Diagnosis not present

## 2018-03-04 DIAGNOSIS — K219 Gastro-esophageal reflux disease without esophagitis: Secondary | ICD-10-CM | POA: Diagnosis present

## 2018-03-04 DIAGNOSIS — C781 Secondary malignant neoplasm of mediastinum: Secondary | ICD-10-CM | POA: Diagnosis present

## 2018-03-04 LAB — BASIC METABOLIC PANEL
ANION GAP: 8 (ref 5–15)
BUN: 37 mg/dL — ABNORMAL HIGH (ref 8–23)
CO2: 21 mmol/L — ABNORMAL LOW (ref 22–32)
CREATININE: 1.62 mg/dL — AB (ref 0.61–1.24)
Calcium: 8.3 mg/dL — ABNORMAL LOW (ref 8.9–10.3)
Chloride: 114 mmol/L — ABNORMAL HIGH (ref 98–111)
GFR calc non Af Amer: 43 mL/min — ABNORMAL LOW (ref >60)
GFR, EST AFRICAN AMERICAN: 50 mL/min — AB (ref >60)
GLUCOSE: 98 mg/dL (ref 70–99)
Potassium: 5 mmol/L (ref 3.5–5.1)
Sodium: 143 mmol/L (ref 135–145)

## 2018-03-04 LAB — CBC
HCT: 24.4 % — ABNORMAL LOW (ref 39.0–52.0)
HEMOGLOBIN: 8.4 g/dL — AB (ref 13.0–17.0)
MCH: 35 pg — AB (ref 26.0–34.0)
MCHC: 34.4 g/dL (ref 30.0–36.0)
MCV: 101.7 fL — ABNORMAL HIGH (ref 78.0–100.0)
Platelets: 23 10*3/uL — CL (ref 150–400)
RBC: 2.4 MIL/uL — AB (ref 4.22–5.81)
RDW: 15.4 % (ref 11.5–15.5)
WBC: 1.9 10*3/uL — ABNORMAL LOW (ref 4.0–10.5)

## 2018-03-04 MED ORDER — TRAMADOL HCL 50 MG PO TABS
50.0000 mg | ORAL_TABLET | Freq: Once | ORAL | Status: AC
  Administered 2018-03-04: 50 mg via ORAL
  Filled 2018-03-04: qty 1

## 2018-03-04 MED ORDER — SODIUM CHLORIDE 0.9 % IV SOLN
INTRAVENOUS | Status: DC
  Administered 2018-03-04 – 2018-03-05 (×3): via INTRAVENOUS

## 2018-03-04 NOTE — Consult Note (Signed)
Consultation Note Date: 03/04/2018   Patient Name: Jesse Avila  DOB: 1951-07-21  MRN: [id number]  Age / Sex: 67 y.o., male  PCP: [name], MD Referring Physician: [name], DO  Reason for Consultation: Establishing goals of care  HPI/Patient Profile: 67 y.o. male admitted on 03/03/2018 from home with complaints of increased weakness. He has a past medical history significant for advanced lung cancer (s/p chemotherapy and radiation), atrial fibrillation, hypertension, hyperlipidemia, carotid artery occlusion, PVD, COPD, chronic diastolic CHF, CKD stage 4, diabetes, and CVA. Patients wife reported on admission that he has been asking for hospice referral for over 3 weeks, which was ordered at discharge last admission. He denied any fevers, nausea, vomiting, or diarrhea. Reports pain is well controlled at home. He was sent to ED from his PCP after labs indicated potassium 3.o and AKI with CR 1.8. Wife reported patient has not been eating or drinking much either. During his ED course he was found to be dehydrated with orthostatic hypotension. Since admission he has received continuous IV fluids, home medications, and electrolyte replacement. Palliative Medicine consulted for goals of care discussion.    Clinical Assessment and Goals of Care: I have reviewed medical records including lab results, imaging, Epic notes, and MAR, received report from the bedside RN, and assessed the patient. I then met at the bedside with patient and his wife, [name] to discuss diagnosis prognosis, GOC, EOL wishes, disposition and options. Patient is alert and oriented x3. He has a flat affect and somewhat tearful. I am familiar with patient and family. I was involved in his care during his last admission on 02/15/18.   At that time patient was open to outpatient Palliative, and wanted to continue to work with home health PT/RN to  hopefully regain some strength and take a family vacation to the beach before he considered transitioning to hospice for end-of-life care. He also was completing palliative radiation.   I re-introduced Palliative Medicine as specialized medical care for people living with serious illness. It focuses on providing relief from the symptoms and stress of a serious illness. The goal is to improve quality of life for both the patient and the family. Family was very appreciative to have our services involved again in his care and expressed concerns that they were never contacted by outpatient hospice or palliative after discharge.   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 [name] is his his wife for more than 40 years and they have one daughter and 3 grandchildren. His 1 granddaughter often stays with them. They are a very close family and enjoy spending time together and going to the beach. His favorite vacation spot.    As far as functional and nutritional status. Patient and wife reports since his last discharge patient has only continued to to show signs of decline. He has become increasingly weaker over the past 2 weeks. Wife states she can barely get him to eat much. He will do ok with hydration however.  He was working with [facility] but unfortunately he was constantly so weak or fatigued he was unable to participate. Wife reports they agreed that PT was not beneficial for him and discontinued their services. Patient is tearful and continues to state, "I just want to get a little stronger and go to the beach with my grandkids even if its only a day before I die." Wife was also tearful and asked patient if he felt like he could take the trip now and he replied he didn't think so because the drive would be to much for him and he would only want to sleep or lay around once he got there. He is ambulatory per the wife but very limited due to weakness and she is afraid  that he will fall. He uses a walker for assistance.   We discussed his current illness and what it means in the larger context of his on-going co-morbidities.  Natural disease trajectory and expectations at EOL were discussed. Wife and patient gets that he is declining and that his body is most likely becoming closer to end-of-life. They are both are tearful during conversation. Patient continues to refer back to his wishes to take a beach trip. Support was given. I attempted to brain storm with patient and wife about how they could at least get to the beach even if it is a day trip versus overnight trip as this really seems important to him. We also discussed that although this is what he wants he may not be able to get to the beach. Patient shook his head and tearful stated "that is what I am thinking". Wife continues to struggle with patient not being able to eat or drink sufficiently to satisfy nutritional needs. She continues to voice that if he could just eat and drink he could get stronger and then he would be able to make the trip. She continues to feel that patient should be set up for IV fluids 1-2 times a week at home for hydration. I spent quite a bit of time discussing his diagnosis and trajectory and how this would not be the ideal solution and would not change the outcome. She verbalized awareness that regardless of IV fluids this would not spontaneously make him better, strengthen, or prolong the inevitable. She verbalized her understanding tearfully and appreciation of detailed explanation.    I attempted to elicit values and goals of care important to the patient and his wife.   The difference between aggressive medical intervention and comfort care was considered in light of the patient's goals of care. We discussed what comfort care would look like in the home or hospital setting. They are aware that the goal is to make sure patient is comfortable and aggressively manage and treat any  symptoms he may have such as anxiety, agitation, shortness of breath, nausea, or pain etc. Without aggressive measures such as lab work, radiology exams, IV fluids, antibiotics etc. Both patient and wife verbalized understanding and patient expressed he wanted to just be at home and be comfortable around his family. Wife verbalized while hospitalized she would like to continue to treat the treatable. She feels as though he is more awake and skin color is better since initiating IV fluids.   Advanced directives, concepts specific to code status, artifical feeding and hydration, and rehospitalization were considered and discussed. Both patient and wife confirmed DNR/DNI status and their wishes not to have artificial feedings such as a PEG tube.   Again  we discussed in detail the end-of-life process, signs of decline (which wife agreed patient has been exhibiting). She voiced that PCP and home care RN from [facility] had also tried to discuss his condition. Wife is accepting however, she continues to state "I just feel like I am giving up on him!" Support was given and we discussed that she is not giving up on him. We discussed how she is supporting him and being a supportive wife by loving and caring for him. We discussed that his body is declining due to his cancer and other co-morbidities and there is nothing she could do differently to stop this and all medical interventions possible have been attempted to slow the progression. She is aware that by supporting him and making sure they enjoy what time he has left is and will be important for both of them. Wife verbalized her understanding and appreciation. Husband holding her hand and giving her confirmation as well.   Wife and patient also states they are somewhat lost as to why all of the medical team both inpatient and outpatient have discussed the process that the patient is dying but have not seen their oncologist, who ultimately has managed their cancer.  They feel that he has not actually told them that he is dying due to his cancer although they have engaged in previous conversations regarding he would get to this point. Support was given and family encouraged that Oncologist may not be aware of current hospitalization.   Hospice outpatient were explained and offered. Wife reports patient was asking about hospice for the past few weeks and they are both in agreement with them being involved in his care at discharge. They are not interested in residential hospice as patient's wishes are to remain in the home with family and friends. Patient and wife fearful that they will not be contacted after discharge as this happened after previous admission. Support was given and family was assured this would not happen again. We briefly discussed the goals and support of hospice. Patient and wife verbalized understanding.   Questions and concerns were addressed.  Hard Choices booklet left for review. The family was encouraged to call with questions or concerns.  PMT will continue to support holistically.  Primary Decision Maker: Patient is A&O x3 and capable of making medical decisions at this time.  HCPOA-Sherri Stangeland, Wife    SUMMARY OF RECOMMENDATIONS    DNR/DNI-as confirmed by patient and wife  Continue to treat the treatable while hospitalized without aggressive measures.   Family is requesting to see Oncologist, Dr. Earlie Server if available. Spoke with his nurse Stanton Kidney, RN and she verbalized she would pass along the message.   Case Manager consult for outpatient hospice services.   Patient reports symptoms and pain is well controlled. Continue with current regimen  Decadron BID  Marinol BID  Vicodin 1-2 tabs PRN every 4hrs as needed for pain   Compazine PRN every 6hrs for nausea/vomiting  Ambien QHS for sleep  Palliative team will continue to support patient, family, and medical team during hospitalization  Code Status/[facility]  Planning:  DNR/DNI   Symptom Management:   Decadron BID pain/discomfort/appetite  Marinol BID appetite  Vicodin 1-2 tabs PRN every 4hrs as needed for pain   Compazine PRN every 6hrs for nausea/vomiting  Ambien QHS for sleep  Palliative Prophylaxis:   Aspiration, Bowel Regimen, Delirium Protocol, Frequent Pain Assessment and Turn Reposition  Additional Recommendations (Limitations, Scope, Preferences):  Full Scope Treatment-continue to treat the treatable  while hospitalized without escalation of care.  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   Discharge Planning: Home with Hospice      Primary Diagnoses: Present on Admission: . Dehydration . Hypokalemia . COPD (chronic obstructive pulmonary disease) ([place]) . Atrial fibrillation ([place]) . AKI (acute kidney injury) ([place]) . CKD (chronic kidney disease) stage 4, GFR 15-29 ml/min (H[facility]) . Malignant neoplasm of lung ([place]) . Orthostatic hypotension . PAD (peripheral artery disease) ([place]) . Chronic diastolic CHF (congestive heart failure) ([place])   I have reviewed the medical record, interviewed the patient and family, and examined the patient. The following aspects are pertinent.  Past Medical History:  Diagnosis Date  . Atrial fibrillation ([place])   . Carotid artery occlusion   . DJD (degenerative joint disease) of knee   . Essential hypertension, benign    takes Benicar daily  . Goals of care, counseling/discussion 09/18/2016  . History of colon polyps    benign  . History of gout   . History of radiation therapy 09/30/16-11/10/16   left lung 60 Gy in 30 fractions  . Hyperlipidemia    takes  Fenofibrate and Crestor daily  . Insomnia    takes Ambien nightly as needed  . Joint pain   . Lung cancer ([place])   . Nocturia   . Peripheral vascular disease ([place])   . Primary localized osteoarthritis of left knee 12/26/2014  . Stroke [facility]) 2015?   takes Plavix daily as well as Pletal,not on plavix at present 05/15/15  . 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 Marital status: Married    Spouse name: Not on file  . Number of children: Not on file  . Years of education: Not on file  . Highest education level: Not on file  Occupational History  . Not on file  Social Needs  . Financial resource strain: Not on file  . Food insecurity:    Worry: Not on file    Inability: Not on file  . Transportation needs:    Medical: Not on file    Non-medical: Not on file  Tobacco Use  . Smoking status: Former Smoker    Packs/day: 1.00    Years: 20.00    Pack years: 20.00    Types: Cigarettes  . Smokeless tobacco: Never Used  . Tobacco comment: quit smoking 2008  Substance and Sexual Activity  . Alcohol use: Yes    Alcohol/week: 0.0 oz    Comment: beer- occasionally  . Drug use: No  . Sexual activity: Never  Lifestyle  . Physical activity:    Days per week: Not on file    Minutes per session: Not on file  . Stress: Not on file  Relationships  . Social connections:    Talks on phone: Not on file    Gets together: Not on file    Attends religious service: Not on file    Active member of club or organization: Not on file    Attends meetings of clubs or organizations: Not on file    Relationship status: Not  on file  Other Topics Concern  . Not on file  Social History Narrative  . Not on file   Family History  Problem Relation Age of Onset  . Diabetes Mother   . Hypertension Mother   . Heart disease Mother        Coronary  Artery Bypass Graft  . Hyperlipidemia Mother   . Heart attack Mother   . Hypertension Father   . Hyperlipidemia Sister   . Stroke Sister    Scheduled Meds: . amiodarone  200 mg Oral Daily  . dexamethasone  2 mg Oral BID  . dronabinol  2.5 mg Oral BID AC  . feeding supplement (ENSURE ENLIVE)  237 mL Oral BID BM  . zolpidem  10 mg Oral QHS   Continuous Infusions: . sodium chloride 100 mL/hr at 03/04/18 0800   PRN Meds:.acetaminophen, HYDROcodone-acetaminophen, prochlorperazine Medications Prior to Admission:  Prior to Admission medications   Medication Sig Start Date End Date Taking? Authorizing Provider  acetaminophen (TYLENOL) 500 MG tablet Take 500 mg by mouth every 6 (six) hours as needed for moderate pain.   Yes [provider]  amiodarone (PACERONE) 200 MG tablet TAKE 1 TABLET BY MOUTH EVERY DAY 02/01/18  Yes Gollan, Kathlene November, MD  dexamethasone (DECADRON) 2 MG tablet Take 1 tablet (2 mg total) by mouth 2 (two) times daily. 02/08/18  Yes Hayden Pedro, PA-C  dronabinol (MARINOL) 2.5 MG capsule Take 1 capsule (2.5 mg total) by mouth 2 (two) times daily before a meal. 02/18/18  Yes Curt Bears, MD  HYDROcodone-acetaminophen (NORCO) 5-325 MG tablet Take 1 tablet by mouth every 6 (six) hours as needed for moderate pain. 02/25/18  Yes Curcio, Roselie Awkward, NP  insulin glargine (LANTUS) 100 UNIT/ML injection Inject 0.1 mLs (10 Units total) into the skin daily. Patient taking differently: Inject 15 Units into the skin at bedtime.  03/11/17  Yes Regalado, Belkys A, MD  lidocaine-prilocaine (EMLA) cream Apply 1 application topically as needed. 11/25/17  Yes Curcio, Roselie Awkward, NP  prochlorperazine (COMPAZINE) 10 MG tablet Take 1 tablet (10 mg total) by mouth every 6 (six) hours as needed for nausea or vomiting. 02/25/18  Yes Curcio, Roselie Awkward, NP  zolpidem (AMBIEN) 10 MG tablet Take 10 mg by mouth at bedtime.  01/26/15  Yes [provider]  furosemide (LASIX) 40 MG tablet  TAKE 1 TABLET BY MOUTH TWICE A DAY Patient not taking: Reported on 03/03/2018 09/09/17   Minna Merritts, MD  KLOR-CON M20 20 MEQ tablet TAKE 1/2 TABLET (10MEQ) BY MOUTH DAILY. Patient not taking: Reported on 03/03/2018 02/07/18   Curt Bears, MD  lactulose [facility]) 10 GM/15ML solution Take 30 mLs (20 g total) by mouth daily as needed for mild constipation or moderate constipation. Patient not taking: Reported on 03/03/2018 12/16/17   Maryanna Shape, NP  lidocaine (XYLOCAINE) 2 % solution Use as directed 15 mLs in the mouth or throat 3 (three) times daily before meals. Patient not taking: Reported on 02/07/2018 11/13/17   Hosie Poisson, MD  ondansetron (ZOFRAN) 8 MG tablet Take 1 tablet (8 mg total) by mouth every 8 (eight) hours as needed for nausea or vomiting. Patient not taking: Reported on 03/03/2018 02/11/18   Curt Bears, MD  pantoprazole (PROTONIX) 40 MG tablet Take 1 tablet (40 mg total) by mouth 2 (two) times daily for 15 days. Patient taking differently: Take 40 mg by mouth 2 (two) times daily as needed (reflux).  11/13/17 02/15/18  Hosie Poisson, MD  polyethylene glycol (MIRALAX / GLYCOLAX) packet Take 17 g by mouth 2 (two) times daily. Patient not taking: Reported on 03/03/2018 02/18/18   Charlynne Cousins, MD  potassium chloride SA (K-DUR,KLOR-CON) 20 MEQ tablet Take 1 tablet (20 mEq total) by mouth 2 (two) times daily. Patient not taking: Reported on 02/15/2018 01/27/18   Maryanna Shape, NP  spironolactone (ALDACTONE) 25 MG tablet Take 1 tablet (25 mg total) by mouth daily. Patient not taking: Reported on 03/03/2018 03/11/17   Regalado, Jerald Kief A, MD  sucralfate (CARAFATE) 1 GM/10ML suspension Take 10 mLs (1 g total) by mouth 4 (four) times daily - after meals and at bedtime. Patient not taking: Reported on 01/28/2018 11/13/17   Hosie Poisson, MD   Allergies  Allergen Reactions  . Cialis [Tadalafil] Other (See Comments)    Headache    Review of Systems  Constitutional:  Positive for appetite change and fatigue.  Musculoskeletal: Positive for back pain.  Neurological: Positive for weakness.  All other systems reviewed and are negative.   Physical Exam  Constitutional: He is oriented to person, place, and time. Vital signs are normal. He is cooperative. He appears ill.  Thin, frail, and chronically ill in appearance.   Cardiovascular: Normal rate, regular rhythm, normal heart sounds and normal pulses.  Pulmonary/Chest: Effort normal. He has decreased breath sounds.  Abdominal: Soft. Normal appearance and bowel sounds are normal.  Musculoskeletal:  Generalized weakness   Neurological: He is alert and oriented to person, place, and time. He displays atrophy.  Skin: Skin is warm, dry and intact.  Psychiatric: He has a normal mood and affect. His speech is normal and behavior is normal. Judgment and thought content normal. Cognition and memory are normal.  Tearful   Nursing note and vitals reviewed.   Vital Signs: BP 117/72 (BP Location: Right Arm)   Pulse 72   Temp 98 F (36.7 C) (Oral)   Resp 16   Ht 6' 1" (1.854 m)   Wt 74.4 kg (164 lb)   SpO2 100%   BMI 21.64 kg/m  Pain Scale: 0-10 POSS *See Group Information*: 1-Acceptable,Awake and alert Pain Score: 0-No pain   SpO2: SpO2: 100 % O2 Device:SpO2: 100 % O2 Flow Rate: .   IO: Intake/output summary:   Intake/Output Summary (Last 24 hours) at 03/04/2018 1523 Last data filed at 03/04/2018 0300 Gross per 24 hour  Intake 1458.45 ml  Output -  Net 1458.45 ml    LBM: Last BM Date: 03/01/18 Baseline Weight: Weight: 74.4 kg (164 lb) Most recent weight: Weight: 74.4 kg (164 lb)     Palliative Assessment/Data:PPS 20%   Time In: 1400 Time Out: 1530 Time Total: 90 min.   Greater than 50%  of this time was spent counseling and coordinating care related to the above assessment and plan.  Signed by:  Alda Lea, NP-BC Palliative Medicine Team  Phone: [phone] Fax:  [phone] Pager: [phone] Amion: Bjorn Pippin    Please contact Palliative Medicine Team phone at [phone] for questions and concerns.  For individual provider: See Shea Evans

## 2018-03-04 NOTE — Progress Notes (Signed)
CRITICAL VALUE ALERT  Critical Value:  Plt- 23  Date & Time Notied:  03/04/18 0705  Provider Notified: [facility]  Orders Received/Actions taken:

## 2018-03-04 NOTE — Progress Notes (Addendum)
PT Cancellation Note  Patient Details Name: Jesse Avila MRN: [id number] DOB: [birth year]   Cancelled Treatment:    Reason Eval/Treat Not Completed: PT screened, no needs identified, will sign off. Spoke with pt and wife-they politely decline PT services. Wife reported pt had HHPT recently but they had to sign off because pt was unable to participate. She feels pt is still too weak to tolerate PT. Will sign off at pt/family's request. Please reorder if needs change or if pt/family decide they want PT back in the picture. Thanks.    Weston Anna, MPT Pager: [phone]

## 2018-03-04 NOTE — Progress Notes (Addendum)
PROGRESS NOTE    [name]  YTK:[id number] DOB: 1950-10-08 DOA: 03/03/2018 PCP: [name], MD     Brief Narrative:  Jesse Avila is a 67 y.o. male with medical history significant of advanced lung cancer who has stopped all treatment, A. fib, hypertension who is not been eating and drinking well for several days.  His wife reports that they have been asking for hospice referral for about 3 weeks now since his discharged from the hospital.  Family just reporting that he is progressively getting weaker.  Patient had labs done with his primary care physician and he was sent to the emergency department because of a potassium level 3 and acute kidney injury with creatinine of 1.8.  Patient is referred for admission for dehydration.  New events last 24 hours / Subjective: No new events, feeling okay overall. No appetite.   Assessment & Plan:   Principal Problem:   Dehydration Active Problems:   PAD (peripheral artery disease) (HCC)   COPD (chronic obstructive pulmonary disease) (HCC)   Atrial fibrillation (HCC)   Malignant neoplasm of lung (HCC)   Chronic diastolic CHF (congestive heart failure) (HCC)   AKI (acute kidney injury) (HCC)   Hypokalemia   CKD (chronic kidney disease) stage 4, GFR 15-29 ml/min (HCC)   Orthostatic hypotension   Acute kidney injury on chronic kidney disease stage III -Baseline creatinine ~1.2-1.6.  Acute kidney injury secondary to poor oral intake -Continue IV fluids  Atrial fibrillation -Continue amiodarone  Small cell carcinoma -Continue Decadron. Patient has stopped all treatments   Chronic diastolic heart failure -Dehydrated without sign of overt heart failure or edema  Hypokalemia -Resolved  Pancytopenia -Without any evidence of bleeding, continue to monitor CBC   DVT prophylaxis: SCD Code Status: DNR Family Communication: Daughter at bedside Disposition Plan: Pending palliative care consultation, may benefit from hospice  enrollment on discharge. Continue IVF. Will change patient to inpatient status; he will remain overnight 2nd midnight for continued treatment for AKI as well as palliative care consultation    Consultants:   Palliative care   Procedures:   None   Antimicrobials:  Anti-infectives (From admission, onward)   None        Objective: Vitals:   03/03/18 1900 03/03/18 2105 03/03/18 2155 03/04/18 0551  BP: 122/71 122/71 118/72 113/71  Pulse:  70 73 73  Resp: 15 15 14 14   Temp:  98.3 F (36.8 C) 97.7 F (36.5 C) 98.1 F (36.7 C)  TempSrc:  Oral Oral Oral  SpO2:  98% 93% 98%  Weight:      Height:        Intake/Output Summary (Last 24 hours) at 03/04/2018 1152 Last data filed at 03/04/2018 0300 Gross per 24 hour  Intake 1458.45 ml  Output -  Net 1458.45 ml   Filed Weights   03/03/18 1524  Weight: 74.4 kg (164 lb)    Examination:  General exam: Appears calm and comfortable, fatigued and weak appearing  Respiratory system: Clear to auscultation. Respiratory effort normal. Cardiovascular system: S1 & S2 heard. No JVD, murmurs, rubs, gallops or clicks. No pedal edema. Gastrointestinal system: Abdomen is nondistended, soft and nontender. No organomegaly or masses felt. Normal bowel sounds heard. Central nervous system: Alert and oriented. No focal neurological deficits. Extremities: Symmetric 5 x 5 power. Skin: No rashes, lesions or ulcers Psychiatry: Judgement and insight appear normal. Mood & affect appropriate.   Data Reviewed: I have personally reviewed following labs and imaging studies  CBC: Recent  Labs  Lab 03/03/18 1603 03/04/18 0615  WBC 2.6* 1.9*  HGB 10.9* 8.4*  HCT 31.8* 24.4*  MCV 100.3* 101.7*  PLT 36* 23*   Basic Metabolic Panel: Recent Labs  Lab 03/03/18 1603 03/04/18 0615  NA 139 143  K 3.0* 5.0  CL 104 114*  CO2 23 21*  GLUCOSE 98 98  BUN 40* 37*  CREATININE 1.85* 1.62*  CALCIUM 8.8* 8.3*   GFR: Estimated Creatinine Clearance: 48.5  mL/min (A) (by C-G formula based on SCr of 1.62 mg/dL (H)). Liver Function Tests: Recent Labs  Lab 03/03/18 1603  AST 20  ALT 27  ALKPHOS 53  BILITOT 1.0  PROT 5.8*  ALBUMIN 3.0*   No results for input(s): LIPASE, AMYLASE in the last 168 hours. No results for input(s): AMMONIA in the last 168 hours. Coagulation Profile: No results for input(s): INR, PROTIME in the last 168 hours. Cardiac Enzymes: No results for input(s): CKTOTAL, CKMB, CKMBINDEX, TROPONINI in the last 168 hours. BNP (last 3 results) No results for input(s): PROBNP in the last 8760 hours. HbA1C: No results for input(s): HGBA1C in the last 72 hours. CBG: Recent Labs  Lab 03/03/18 1724  GLUCAP 81   Lipid Profile: No results for input(s): CHOL, HDL, LDLCALC, TRIG, CHOLHDL, LDLDIRECT in the last 72 hours. Thyroid Function Tests: No results for input(s): TSH, T4TOTAL, FREET4, T3FREE, THYROIDAB in the last 72 hours. Anemia Panel: No results for input(s): VITAMINB12, FOLATE, FERRITIN, TIBC, IRON, RETICCTPCT in the last 72 hours. Sepsis Labs: No results for input(s): PROCALCITON, LATICACIDVEN in the last 168 hours.  No results found for this or any previous visit (from the past 240 hour(s)).     Radiology Studies: Dg Chest 2 View  Result Date: 03/03/2018 CLINICAL DATA:  Weakness EXAM: CHEST - 2 VIEW COMPARISON:  CT 01/25/2018, 07/17/2017 radiograph FINDINGS: Right-sided central venous port tip overlies the SVC. Right lung is clear. Post treatment changes in the left upper lung. Hilar retraction on the left. Increasing opacity at the left lung apex. Normal heart size. No pneumothorax. IMPRESSION: Increased opacity within the left apical lung, some of which is due to post radiation change. There may be an acute superimposed pneumonia in this region. Electronically Signed   By: Donavan Foil M.D.   On: 03/03/2018 19:25      Scheduled Meds: . amiodarone  200 mg Oral Daily  . dexamethasone  2 mg Oral BID  .  dronabinol  2.5 mg Oral BID AC  . feeding supplement (ENSURE ENLIVE)  237 mL Oral BID BM  . zolpidem  10 mg Oral QHS   Continuous Infusions: . sodium chloride 100 mL/hr at 03/04/18 0800     LOS: 0 days    Time spent: 35 minutes   [name], DO [facility] [email or web address] Password [email or web address] 03/04/2018, 11:52 AM

## 2018-03-05 ENCOUNTER — Ambulatory Visit: Payer: BLUE CROSS/BLUE SHIELD | Admitting: Adult Health

## 2018-03-05 ENCOUNTER — Telehealth: Payer: Self-pay | Admitting: Medical Oncology

## 2018-03-05 DIAGNOSIS — D61818 Other pancytopenia: Secondary | ICD-10-CM

## 2018-03-05 DIAGNOSIS — N179 Acute kidney failure, unspecified: Principal | ICD-10-CM

## 2018-03-05 DIAGNOSIS — Z515 Encounter for palliative care: Secondary | ICD-10-CM

## 2018-03-05 DIAGNOSIS — I951 Orthostatic hypotension: Secondary | ICD-10-CM

## 2018-03-05 DIAGNOSIS — E876 Hypokalemia: Secondary | ICD-10-CM

## 2018-03-05 DIAGNOSIS — D696 Thrombocytopenia, unspecified: Secondary | ICD-10-CM

## 2018-03-05 DIAGNOSIS — R627 Adult failure to thrive: Secondary | ICD-10-CM

## 2018-03-05 DIAGNOSIS — Z66 Do not resuscitate: Secondary | ICD-10-CM

## 2018-03-05 DIAGNOSIS — C3401 Malignant neoplasm of right main bronchus: Secondary | ICD-10-CM

## 2018-03-05 DIAGNOSIS — Z7189 Other specified counseling: Secondary | ICD-10-CM

## 2018-03-05 DIAGNOSIS — I5032 Chronic diastolic (congestive) heart failure: Secondary | ICD-10-CM

## 2018-03-05 DIAGNOSIS — I482 Chronic atrial fibrillation: Secondary | ICD-10-CM

## 2018-03-05 LAB — CBC
HEMATOCRIT: 24.8 % — AB (ref 39.0–52.0)
HEMOGLOBIN: 8.2 g/dL — AB (ref 13.0–17.0)
MCH: 34.6 pg — AB (ref 26.0–34.0)
MCHC: 33.1 g/dL (ref 30.0–36.0)
MCV: 104.6 fL — AB (ref 78.0–100.0)
Platelets: 29 10*3/uL — CL (ref 150–400)
RBC: 2.37 MIL/uL — ABNORMAL LOW (ref 4.22–5.81)
RDW: 15.5 % (ref 11.5–15.5)
WBC: 3.2 10*3/uL — ABNORMAL LOW (ref 4.0–10.5)

## 2018-03-05 LAB — BASIC METABOLIC PANEL
Anion gap: 6 (ref 5–15)
BUN: 32 mg/dL — AB (ref 8–23)
CHLORIDE: 111 mmol/L (ref 98–111)
CO2: 21 mmol/L — AB (ref 22–32)
Calcium: 8.1 mg/dL — ABNORMAL LOW (ref 8.9–10.3)
Creatinine, Ser: 1.42 mg/dL — ABNORMAL HIGH (ref 0.61–1.24)
GFR calc Af Amer: 59 mL/min — ABNORMAL LOW (ref >60)
GFR, EST NON AFRICAN AMERICAN: 51 mL/min — AB (ref >60)
GLUCOSE: 142 mg/dL — AB (ref 70–99)
Potassium: 4.4 mmol/L (ref 3.5–5.1)
SODIUM: 138 mmol/L (ref 135–145)

## 2018-03-05 MED ORDER — DRONABINOL 2.5 MG PO CAPS
2.5000 mg | ORAL_CAPSULE | Freq: Two times a day (BID) | ORAL | 0 refills | Status: AC
Start: 2018-03-05 — End: ?

## 2018-03-05 MED ORDER — HYDROCODONE-ACETAMINOPHEN 5-325 MG PO TABS: 1.0000 | ORAL_TABLET | ORAL | 0 refills | Status: AC | PRN

## 2018-03-05 MED ORDER — PROCHLORPERAZINE MALEATE 10 MG PO TABS
10.0000 mg | ORAL_TABLET | Freq: Four times a day (QID) | ORAL | 0 refills | Status: AC | PRN
Start: 2018-03-05 — End: ?

## 2018-03-05 MED ORDER — DEXAMETHASONE 2 MG PO TABS: 2.0000 mg | ORAL_TABLET | Freq: Two times a day (BID) | ORAL | 0 refills | Status: AC

## 2018-03-05 MED ORDER — HEPARIN SOD (PORK) LOCK FLUSH 100 UNIT/ML IV SOLN
500.0000 [IU] | Freq: Once | INTRAVENOUS | Status: AC
  Administered 2018-03-05: 500 [IU]
  Filled 2018-03-05: qty 5

## 2018-03-05 MED ORDER — HYDROCODONE-ACETAMINOPHEN 5-325 MG PO TABS
1.0000 | ORAL_TABLET | ORAL | Status: DC | PRN
  Administered 2018-03-05: 2 via ORAL
  Filled 2018-03-05: qty 2

## 2018-03-05 MED ORDER — ZOLPIDEM TARTRATE 10 MG PO TABS: 10.0000 mg | ORAL_TABLET | Freq: Every evening | ORAL | 0 refills | Status: AC | PRN

## 2018-03-05 MED ORDER — SUCRALFATE 1 GM/10ML PO SUSP: 1.0000 g | Freq: Three times a day (TID) | ORAL | 0 refills | Status: AC

## 2018-03-05 NOTE — Progress Notes (Signed)
[facility] -- [facility] [place] ([facility]) RN Visit @ 1300  Notified by Marney Doctor, RNCM of patient/family request for [facility] services at home after discharge. Hospice eligibility has been confirmed by [facility] physician.  Spoke with patient and wife Jesse Avila, at the bedside to initiate education related to hospice philosophy, services and team approach to care. Patient and wife verbalized understanding of information given. Per discussion, plan is for discharge to home by private care today, Friday 03/05/18.  DME needs have been discussed, patient currently has a walker, cane, wheelchair and 3 in 1 in the home. Requesting a shower bench. [facility] DME specialist has been notified and will contact [facility] to arrange delivery to the home. Home address has been verified and is correct in the chart. Jesse Avila, (wife) [phone] is the family member to contact to arrange time of delivery.  Please send signed and completed DNR form home with the patient/family.   Patient will need prescriptions for discharge comfort medications.  [facility]erral Center aware of the above. Completed discharge summary will need to be faxed to [facility] at [phone]. Please notify [facility] when patient is ready to leave the unit at discharge (call [phone] between 8:30 am-5:00 pm or [phone] after 5 pm). [facility] information and contact numbers given to [facility] during this visit. Above information shared with Marney Doctor, RNCM.  Please call with any hospice related questions.  Thank you for this referral.  Margaretmary Eddy, RN, BSN [facility] Liaison [phone]  [place] are on AMION

## 2018-03-05 NOTE — Care Management Note (Signed)
Case Management Note  Patient Details  Name: Jesse Avila MRN: [id number] Date of Birth: 1951/01/05  CM consult for home hospice services. This CM met with pt and wife to offer choice and [facility] was chosen. [facility] called to give referral. Pt states that the only DME he needs is a bench in his tub. Pt is to DC home later today with wife.  Lynnell Catalan, RN 03/05/2018, 11:57 AM [phone]

## 2018-03-05 NOTE — Progress Notes (Signed)
OT Cancellation Note  Patient Details Name: JAZIAH GOELLER MRN: [id number] DOB: 1951/07/04   Cancelled Treatment:    Reason Eval/Treat Not Completed: Other (comment).  Noted pt/family declined PT due to being too weak. Will sign off.  Mariaha Ellington 03/05/2018, 7:18 AM  Lesle Chris, OTR/L [phone] 03/05/2018

## 2018-03-05 NOTE — Discharge Summary (Addendum)
Physician Discharge Summary  JAMILE SIVILS AVW:[id number] DOB: May 30, 1951 DOA: 03/03/2018  PCP: [name], MD  Admit date: 03/03/2018 Discharge date: 03/05/2018  Admitted From: Home Disposition:  Home with home hospice   Discharge Condition: Stable, but poor prognosis CODE STATUS: DNR  Diet recommendation: Regular   Brief/Interim Summary: Jesse Laine Bowersis a 67 y.o.malewith medical history significant ofadvanced lung cancer who has stopped all treatment, A. fib, hypertension who is not been eating and drinking well for several days. His wife reports that they have been asking for hospice referral for about 3 weeks now since his discharged from the hospital. Family just reporting that he is progressively getting weaker. Patient had labs done with his primary care physician and he was sent to the emergency department because of a potassium level 3 and acute kidney injury with creatinine of 1.8. Patient is referred for admission for dehydration. He was given IVF with improvement in Cr to 1.42 (baseline Cr 1.2-1.6). He and his wife met with palliative care team and decided to transition to home with home hospice.   Discharge Diagnoses:  Principal Problem:   AKI (acute kidney injury) ([place]) Active Problems:   GERD (gastroesophageal reflux disease)   Diabetes mellitus, type 2 (HCC)   PAD (peripheral artery disease) (HCC)   COPD (chronic obstructive pulmonary disease) (HCC)   Small cell lung carcinoma, left (HCC)   AF (paroxysmal atrial fibrillation) (HCC)   Malignant neoplasm of lung (HCC)   Chronic diastolic CHF (congestive heart failure) (HCC)   Dehydration   Hypokalemia   CKD (chronic kidney disease) stage 4, GFR 15-29 ml/min (HCC)   Orthostatic hypotension   Pancytopenia ([place])   Discharge Instructions  Discharge Instructions    Diet general   Complete by:  As directed    Discharge instructions   Complete by:  As directed    You were cared for by a hospitalist during your  hospital stay. If you have any questions about your discharge medications or the care you received while you were in the hospital after you are discharged, you can call the unit and ask to speak with the hospitalist on call if the hospitalist that took care of you is not available. Once you are discharged, your primary care physician will handle any further medical issues. Please note that NO REFILLS for any discharge medications will be authorized once you are discharged, as it is imperative that you return to your primary care physician (or establish a relationship with a primary care physician if you do not have one) for your aftercare needs so that they can reassess your need for medications and monitor your lab values.   Increase activity slowly   Complete by:  As directed      Allergies as of 03/05/2018      Reactions   Cialis [tadalafil] Other (See Comments)   Headache       Medication List    STOP taking these medications   amiodarone 200 MG tablet Commonly known as:  PACERONE   furosemide 40 MG tablet Commonly known as:  LASIX   KLOR-CON M20 20 MEQ tablet Generic drug:  potassium chloride SA   lactulose 10 GM/15ML solution Commonly known as:  CHRONULAC   lidocaine 2 % solution Commonly known as:  XYLOCAINE   lidocaine-prilocaine cream Commonly known as:  EMLA   ondansetron 8 MG tablet Commonly known as:  ZOFRAN   polyethylene glycol packet Commonly known as:  MIRALAX / GLYCOLAX   potassium chloride  SA 20 MEQ tablet Commonly known as:  K-DUR,KLOR-CON   spironolactone 25 MG tablet Commonly known as:  ALDACTONE     TAKE these medications   acetaminophen 500 MG tablet Commonly known as:  TYLENOL Take 500 mg by mouth every 6 (six) hours as needed for moderate pain.   dexamethasone 2 MG tablet Commonly known as:  DECADRON Take 1 tablet (2 mg total) by mouth 2 (two) times daily.   dronabinol 2.5 MG capsule Commonly known as:  MARINOL Take 1 capsule (2.5 mg  total) by mouth 2 (two) times daily before a meal.   HYDROcodone-acetaminophen 5-325 MG tablet Commonly known as:  NORCO Take 1-2 tablets by mouth every 3 (three) hours as needed for moderate pain. What changed:    how much to take  when to take this   insulin glargine 100 UNIT/ML injection Commonly known as:  LANTUS Inject 0.1 mLs (10 Units total) into the skin daily. What changed:    how much to take  when to take this   pantoprazole 40 MG tablet Commonly known as:  PROTONIX Take 1 tablet (40 mg total) by mouth 2 (two) times daily for 15 days. What changed:    when to take this  reasons to take this   prochlorperazine 10 MG tablet Commonly known as:  COMPAZINE Take 1 tablet (10 mg total) by mouth every 6 (six) hours as needed for nausea or vomiting.   sucralfate 1 GM/10ML suspension Commonly known as:  CARAFATE Take 10 mLs (1 g total) by mouth 4 (four) times daily - after meals and at bedtime.   zolpidem 10 MG tablet Commonly known as:  AMBIEN Take 1 tablet (10 mg total) by mouth at bedtime as needed for sleep. What changed:    when to take this  reasons to take this       Allergies  Allergen Reactions  . Cialis [Tadalafil] Other (See Comments)    Headache     Consultations:  Palliative care    Procedures/Studies: Dg Chest 2 View  Result Date: 03/03/2018 CLINICAL DATA:  Weakness EXAM: CHEST - 2 VIEW COMPARISON:  CT 01/25/2018, 07/17/2017 radiograph FINDINGS: Right-sided central venous port tip overlies the SVC. Right lung is clear. Post treatment changes in the left upper lung. Hilar retraction on the left. Increasing opacity at the left lung apex. Normal heart size. No pneumothorax. IMPRESSION: Increased opacity within the left apical lung, some of which is due to post radiation change. There may be an acute superimposed pneumonia in this region. Electronically Signed   By: Donavan Foil M.D.   On: 03/03/2018 19:25     Discharge Exam: Vitals:    03/04/18 2028 03/05/18 0422  BP: 113/74 133/76  Pulse: 76 80  Resp: 18 16  Temp: 98.5 F (36.9 C) 98 F (36.7 C)  SpO2: 99% 98%    General: Pt is alert, awake, not in acute distress, weak  Cardiovascular: RRR, S1/S2 +, no rubs, no gallops Respiratory: CTA bilaterally, no wheezing, no rhonchi Abdominal: Soft, NT, ND, bowel sounds + Extremities: no edema, no cyanosis    The results of significant diagnostics from this hospitalization (including imaging, microbiology, ancillary and laboratory) are listed below for reference.     Microbiology: No results found for this or any previous visit (from the past 240 hour(s)).   Labs: BNP (last 3 results) No results for input(s): BNP in the last 8760 hours. Basic Metabolic Panel: Recent Labs  Lab 03/03/18 1603 03/04/18 0615 03/05/18  0513  NA 139 143 138  K 3.0* 5.0 4.4  CL 104 114* 111  CO2 23 21* 21*  GLUCOSE 98 98 142*  BUN 40* 37* 32*  CREATININE 1.85* 1.62* 1.42*  CALCIUM 8.8* 8.3* 8.1*   Liver Function Tests: Recent Labs  Lab 03/03/18 1603  AST 20  ALT 27  ALKPHOS 53  BILITOT 1.0  PROT 5.8*  ALBUMIN 3.0*   No results for input(s): LIPASE, AMYLASE in the last 168 hours. No results for input(s): AMMONIA in the last 168 hours. CBC: Recent Labs  Lab 03/03/18 1603 03/04/18 0615 03/05/18 0513  WBC 2.6* 1.9* 3.2*  HGB 10.9* 8.4* 8.2*  HCT 31.8* 24.4* 24.8*  MCV 100.3* 101.7* 104.6*  PLT 36* 23* 29*   Cardiac Enzymes: No results for input(s): CKTOTAL, CKMB, CKMBINDEX, TROPONINI in the last 168 hours. BNP: Invalid input(s): POCBNP CBG: Recent Labs  Lab 03/03/18 1724  GLUCAP 81   D-Dimer No results for input(s): DDIMER in the last 72 hours. Hgb A1c No results for input(s): HGBA1C in the last 72 hours. Lipid Profile No results for input(s): CHOL, HDL, LDLCALC, TRIG, CHOLHDL, LDLDIRECT in the last 72 hours. Thyroid function studies No results for input(s): TSH, T4TOTAL, T3FREE, THYROIDAB in the last 72  hours.  Invalid input(s): FREET3 Anemia work up No results for input(s): VITAMINB12, FOLATE, FERRITIN, TIBC, IRON, RETICCTPCT in the last 72 hours. Urinalysis    Component Value Date/Time   COLORURINE YELLOW 02/15/2018 1718   APPEARANCEUR CLOUDY (A) 02/15/2018 1718   LABSPEC 1.014 02/15/2018 1718   PHURINE 5.0 02/15/2018 1718   GLUCOSEU NEGATIVE 02/15/2018 1718   HGBUR MODERATE (A) 02/15/2018 1718   BILIRUBINUR NEGATIVE 02/15/2018 1718   KETONESUR NEGATIVE 02/15/2018 1718   PROTEINUR 30 (A) 02/15/2018 1718   UROBILINOGEN 0.2 05/15/2015 0900   NITRITE NEGATIVE 02/15/2018 1718   LEUKOCYTESUR NEGATIVE 02/15/2018 1718   Sepsis Labs Invalid input(s): PROCALCITONIN,  WBC,  LACTICIDVEN Microbiology No results found for this or any previous visit (from the past 240 hour(s)).   Patient was seen and examined on the day of discharge and was found to be in stable condition. Time coordinating discharge: 35 minutes including assessment and coordination of care, as well as examination of the patient.   SIGNED:  [name], DO [facility] Pager [phone]  If 7PM-7AM, please contact night-coverage [email or web address] Password [facility] 03/05/2018, 11:48 AM

## 2018-03-05 NOTE — Telephone Encounter (Signed)
Jesse Avila be Hospice attending? I told her yes.

## 2018-03-08 ENCOUNTER — Ambulatory Visit: Payer: BLUE CROSS/BLUE SHIELD | Admitting: Adult Health

## 2018-03-09 ENCOUNTER — Telehealth: Payer: Self-pay | Admitting: Medical Oncology

## 2018-03-09 NOTE — Telephone Encounter (Signed)
Pt not coming for appt tomorrow. On Hospice. Appts cancelled.

## 2018-03-10 ENCOUNTER — Inpatient Hospital Stay: Payer: BLUE CROSS/BLUE SHIELD | Admitting: Internal Medicine

## 2018-03-10 ENCOUNTER — Inpatient Hospital Stay: Payer: BLUE CROSS/BLUE SHIELD

## 2018-03-10 NOTE — Progress Notes (Signed)
  Radiation Oncology         [phone] ________________________________  Name: Jesse Avila MRN: [id number]  Date: 02/24/2018  DOB: 01-30-1951  End of Treatment Note  Diagnosis:   67 y.o. 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  The patient was initially planned to receive a total of 30 Gy in 10 fractions to his abdomen, but he declined further treatment after 6 fractions.  Plan: The patient has completed radiation treatment. The patient will return to radiation oncology clinic for routine followup in one month. I advised them to call or return sooner if they have any questions or concerns related to their recovery or treatment.  ------------------------------------------------  Jodelle Gross, MD, PhD  This document serves as a record of services personally performed by Jesse Rudd, MD. It was created on his behalf by Rae Lips, a trained medical scribe. The creation of this record is based on the scribe's personal observations and the provider's statements to them. This document has been checked and approved by the attending provider.

## 2018-03-16 ENCOUNTER — Ambulatory Visit: Payer: Medicare Other | Admitting: Adult Health

## 2018-03-22 ENCOUNTER — Telehealth: Payer: Self-pay | Admitting: *Deleted

## 2018-03-22 NOTE — Telephone Encounter (Signed)
TC from Barb Merino, RN @ [facility] stating that patient expired at home on Apr 18, 2018 @ 2:19 am

## 2018-03-23 NOTE — Progress Notes (Signed)
FMLA successfully faxed to [employer] at [phone]. Mailed copy to patient address on file.

## 2018-03-25 ENCOUNTER — Ambulatory Visit: Payer: Self-pay | Admitting: Radiation Oncology

## 2018-03-25 DIAGNOSIS — C772 Secondary and unspecified malignant neoplasm of intra-abdominal lymph nodes: Secondary | ICD-10-CM | POA: Insufficient documentation

## 2018-03-25 NOTE — Progress Notes (Signed)
  Radiation Oncology         [phone] ________________________________  Name: ISHAAQ PENNA MRN: [id number]  Date: 02/12/2018  DOB: [birth year]  SIMULATION AND TREATMENT PLANNING NOTE  DIAGNOSIS:     ICD-10-CM   1. 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  Identity was confirmed.  All relevant records and images related to the planned course of therapy were reviewed.   Written consent to proceed with treatment was confirmed which was freely given after reviewing the details related to the planned course of therapy had been reviewed with the patient.  Then, the patient was set-up in a stable reproducible  supine position for radiation therapy.  CT images were obtained.  Surface markings were placed.     The CT images were loaded into the planning software.  Then the target and avoidance structures were contoured.  Treatment planning then occurred.  The radiation prescription was entered and confirmed.  A total of 3 complex treatment devices were fabricated which relate to the designed radiation treatment fields. Each of these customized fields/ complex treatment devices will be used on a daily basis during the radiation course. I have requested : 3D Simulation  I have requested a DVH of the following structures: Target volume, left kidney, right kidney, spinal cord.   The patient will undergo daily image guidance to ensure accurate localization of the target, and adequate minimize dose to the normal surrounding structures in close proximity to the target.  PLAN:  The patient will receive 30 Gy in 10 fractions.  ________________________________   Jodelle Gross, MD, PhD

## 2018-03-25 NOTE — Progress Notes (Signed)
  Radiation Oncology         [phone] ________________________________  Name: HEITH HAIGLER MRN: [id number]  Date: 02/09/2018  DOB: Jun 23, 1951  DIAGNOSIS:     ICD-10-CM   1. Brain metastasis ([place]) C79.31     NARRATIVE:  The patient was brought to the [facility].  Identity was confirmed.  All relevant records and images related to the planned course of therapy were reviewed.  The patient freely provided informed written consent to proceed with treatment after reviewing the details related to the planned course of therapy. The consent form was witnessed and verified by the simulation staff. Intravenous access was established for contrast administration. Then, the patient was set-up in a stable reproducible supine position for radiation therapy.  A relocatable thermoplastic stereotactic head frame was fabricated for precise immobilization.  CT images were obtained.  Surface markings were placed.  The CT images were loaded into the planning software and fused with the patient's targeting MRI scan.  Then the target and avoidance structures were contoured.  Treatment planning then occurred.  The radiation prescription was entered and confirmed.  I have requested 3D planning  I have requested a DVH of the following structures: Brain stem, brain, left eye, right eye, lenses, optic chiasm, target volumes, uninvolved brain, and normal tissue.    SPECIAL TREATMENT PROCEDURE:  The planned course of therapy using radiation constitutes a special treatment procedure. Special care is required in the management of this patient for the following reasons. This treatment constitutes a Special Treatment Procedure for the following reason: High dose per fraction requiring special monitoring for increased toxicities of treatment including daily imaging.  The special nature of the planned course of radiotherapy will require increased physician supervision and oversight to ensure patient's safety with optimal  treatment outcomes.  PLAN:  The patient will receive 20 Gy in 1 fraction.   ------------------------------------------------  Jodelle Gross, MD, PhD

## 2018-03-27 ENCOUNTER — Other Ambulatory Visit: Payer: Self-pay | Admitting: Cardiovascular Disease

## 2018-03-29 ENCOUNTER — Telehealth: Payer: Self-pay | Admitting: Cardiovascular Disease

## 2018-03-29 NOTE — Telephone Encounter (Signed)
-----   Message from Anselm Pancoast, Roland sent at 03/29/2018  8:58 AM EDT ----- Please contact patient for a follow up appointment. The patient needs to be seen for refills. Recall is for May 2019. Thanks, SHAron

## 2018-03-29 NOTE — Telephone Encounter (Signed)
LMOV to schedule ov with Dr. Rockey Situ.

## 2018-03-31 ENCOUNTER — Ambulatory Visit: Payer: BLUE CROSS/BLUE SHIELD

## 2018-03-31 ENCOUNTER — Ambulatory Visit: Payer: BLUE CROSS/BLUE SHIELD | Admitting: Internal Medicine

## 2018-03-31 ENCOUNTER — Other Ambulatory Visit: Payer: BLUE CROSS/BLUE SHIELD

## 2018-03-31 NOTE — Telephone Encounter (Signed)
LMOV to schedule ov with Dr. Rockey Situ.

## 2018-04-02 NOTE — Telephone Encounter (Signed)
LMOV to schedule ov with Dr. Rockey Situ

## 2018-04-04 DEATH — deceased

## 2018-04-06 ENCOUNTER — Encounter: Payer: Self-pay | Admitting: Cardiovascular Disease

## 2018-04-06 NOTE — Telephone Encounter (Signed)
Sent letter

## 2018-04-20 ENCOUNTER — Other Ambulatory Visit: Payer: Self-pay | Admitting: Cardiovascular Disease

## 2018-09-14 IMAGING — CR DG CHEST 2V
2 series · 2 of 2 positions shown · non-contrast
Comparison: Chest radiograph 09/08/2016.

CLINICAL DATA: Patient with history of lung cancer. Posterior chest
and scapular pain.

EXAM:
CHEST  2 VIEW

[w chest pa]
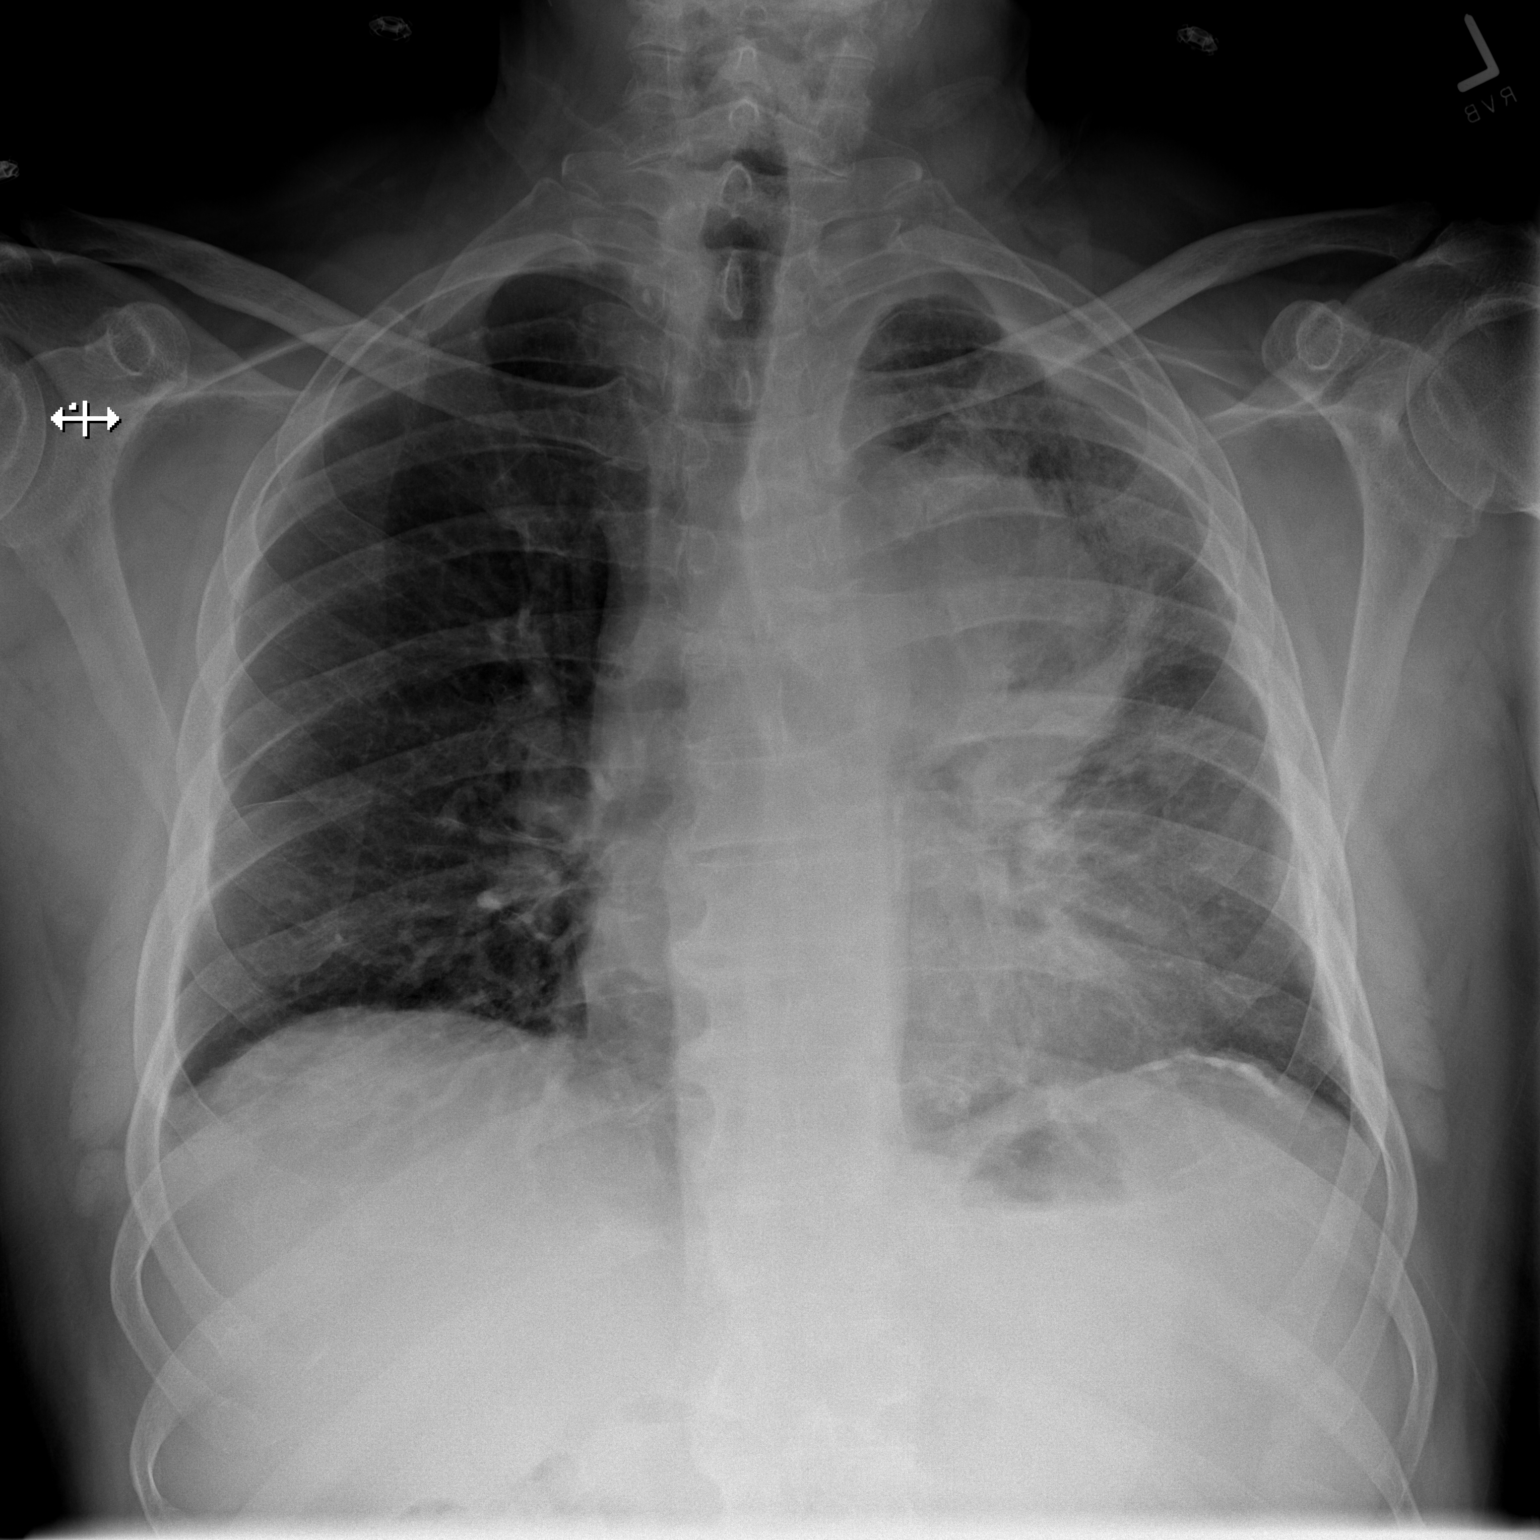

[w chest lat]
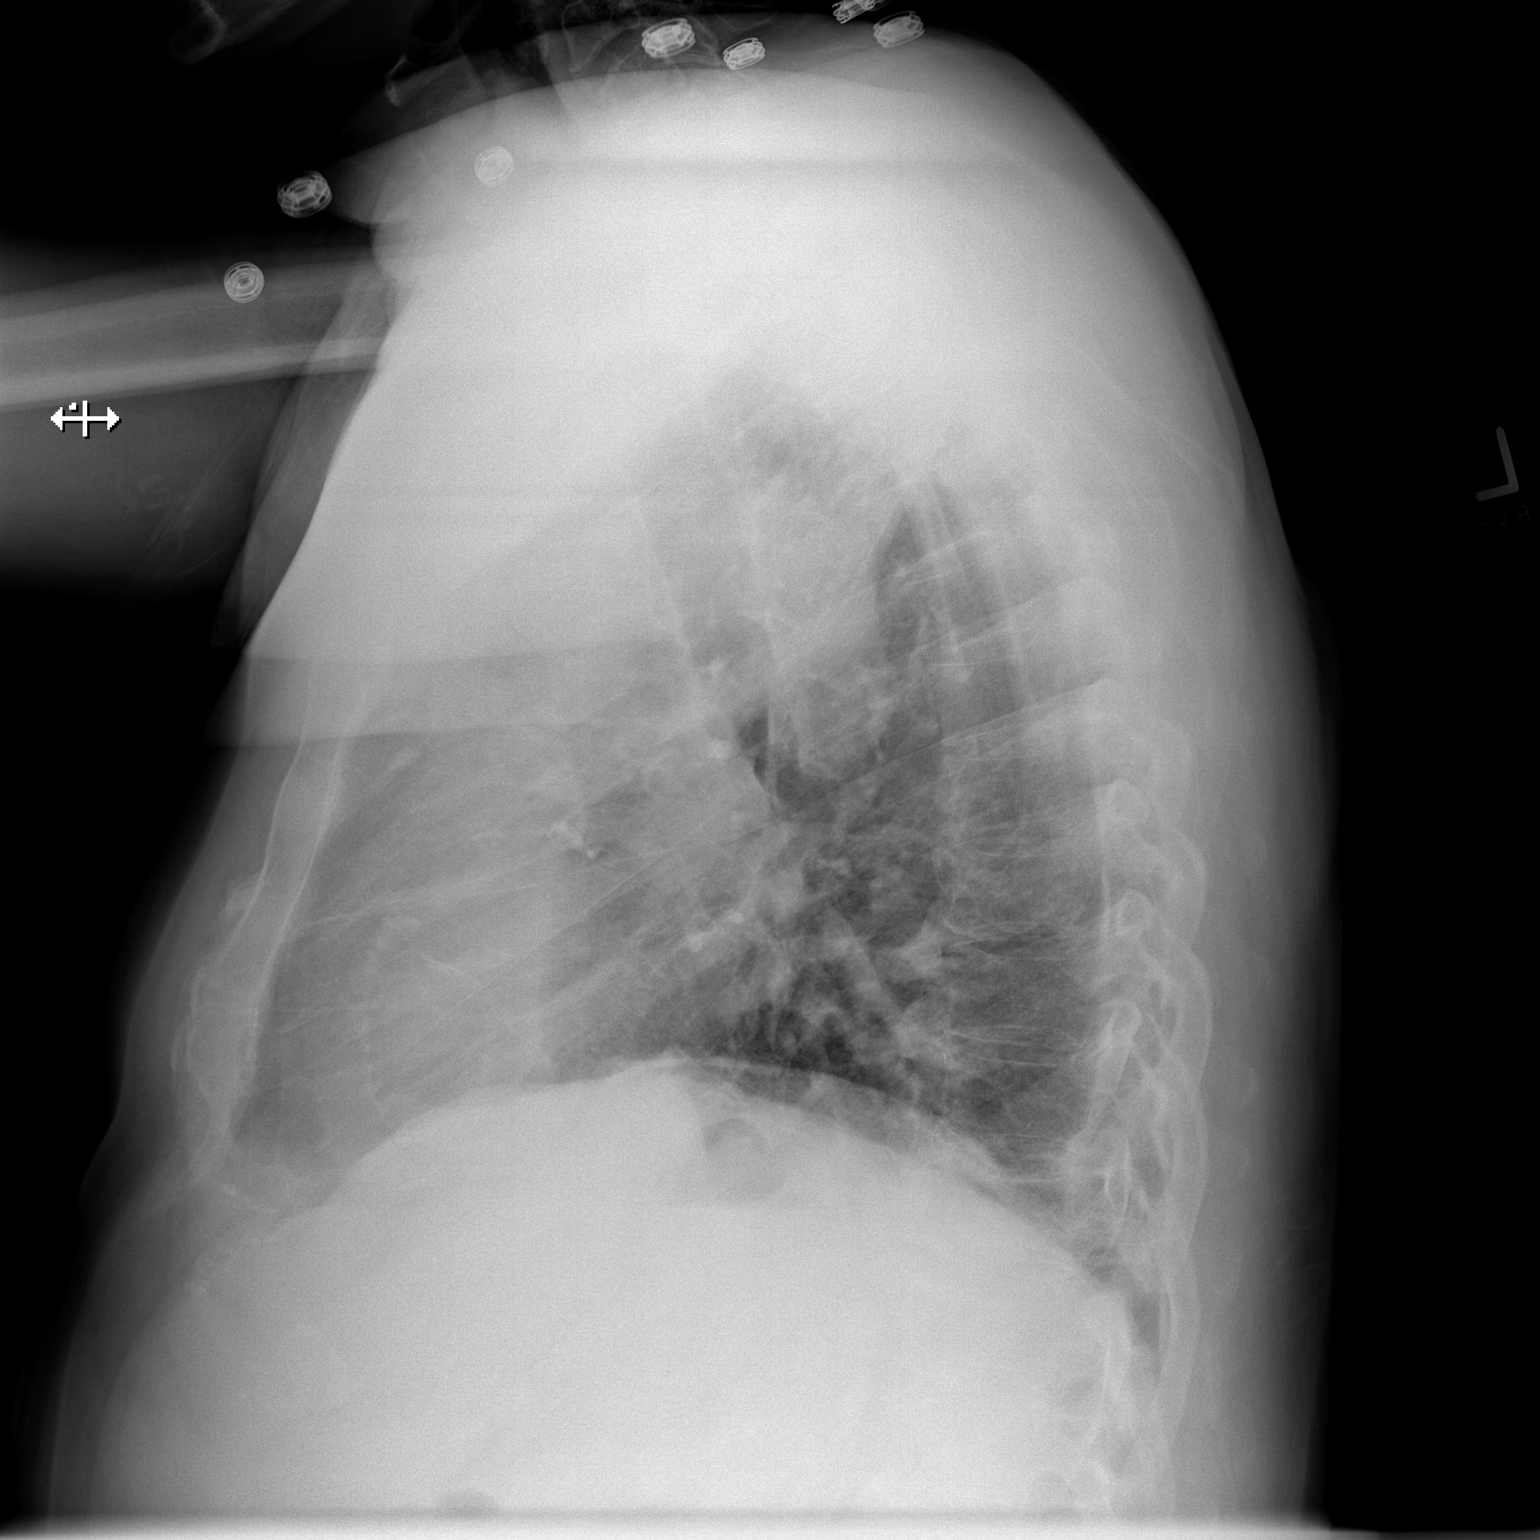

[2 of 2 positions shown; findings below may reference images not displayed]

FINDINGS: Stable cardiac and mediastinal contours. Persistent large left
suprahilar mass. Unchanged bandlike consolidation within the left
mid lung. Pleural thickening overlying the left lung apex. No
pneumothorax. Thoracic spine degenerative changes.
IMPRESSION: Probable small amount of pleural fluid left upper hemithorax.

Re- demonstrated large left suprahilar mass.

## 2018-09-19 IMAGING — MR MR HEAD WO/W CM
10 of 13 series · 34 of 48 positions shown · IV contrast (multihance)
Comparison: 04/15/2014

CLINICAL DATA: Recent diagnosis small cell lung cancer.  Staging

EXAM:
MRI HEAD WITHOUT AND WITH CONTRAST
TECHNIQUE: Multiplanar, multiecho pulse sequences of the brain and surrounding
structures were obtained without and with intravenous contrast.
CONTRAST:  20mL MULTIHANCE GADOBENATE DIMEGLUMINE 529 MG/ML IV SOLN

[Series 3: T1 · sagittal · 5.0mm · 0.47mm/px · 1 of 24 slices shown]
[im 1/24]
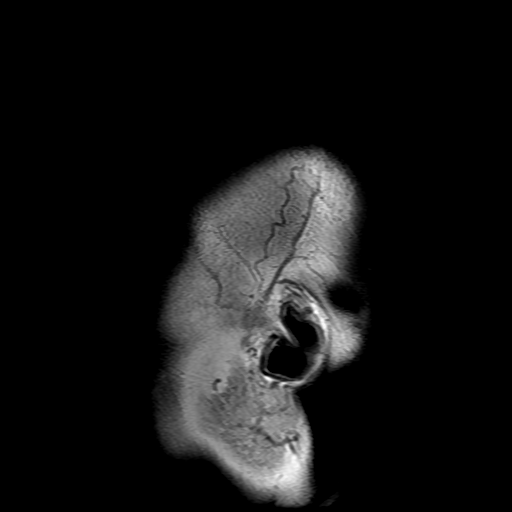

[Series 4: DWI · axial · 3.0mm · 1.09mm/px · z∈[-54,+93]mm · 9 of 100 slices shown (1 of 4)]
[im 1/100]
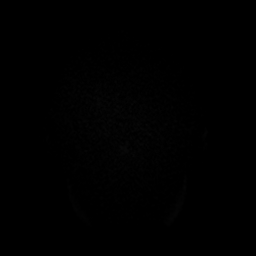
[im 13/100]
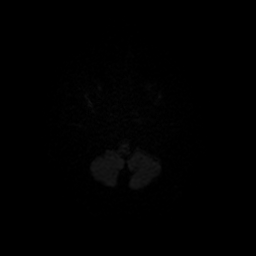
[im 25/100]
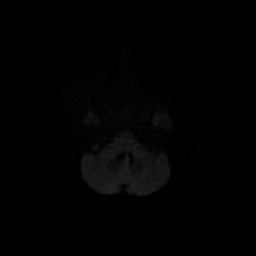
[im 38/100]
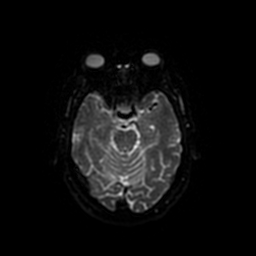
[im 50/100]
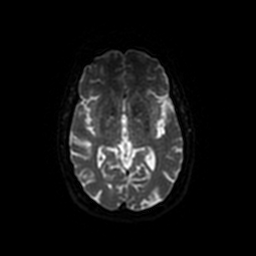
[im 62/100]
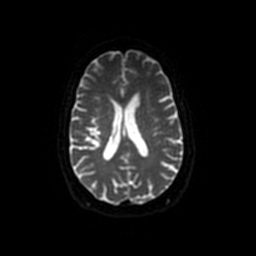
[im 75/100]
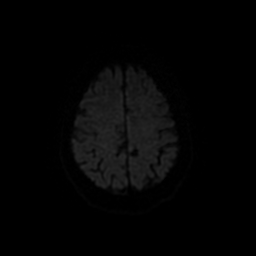
[im 87/100]
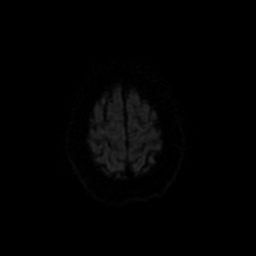
[im 100/100]
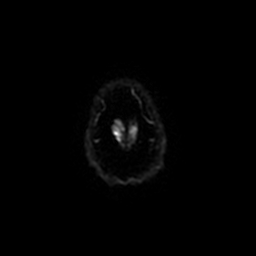

[Series 5: DWI · coronal · 5.0mm · 1.09mm/px · 6 of 68 slices shown (2 of 4)]
[im 1/68]
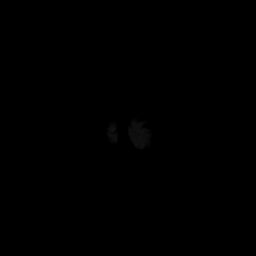
[im 14/68]
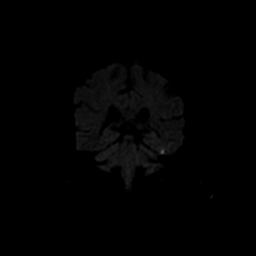
[im 27/68]
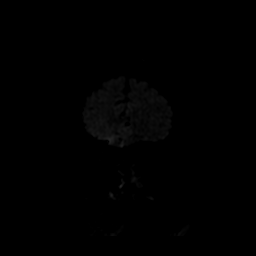
[im 41/68]
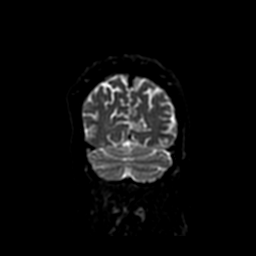
[im 54/68]
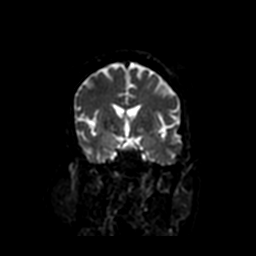
[im 68/68]
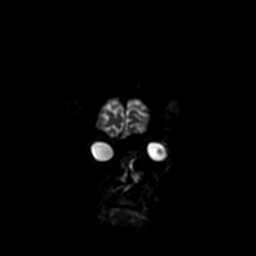

[Series 6: T2 · axial · 5.0mm · 0.43mm/px · z∈[-64,+85]mm · 2 of 24 slices shown]
[im 1/24]
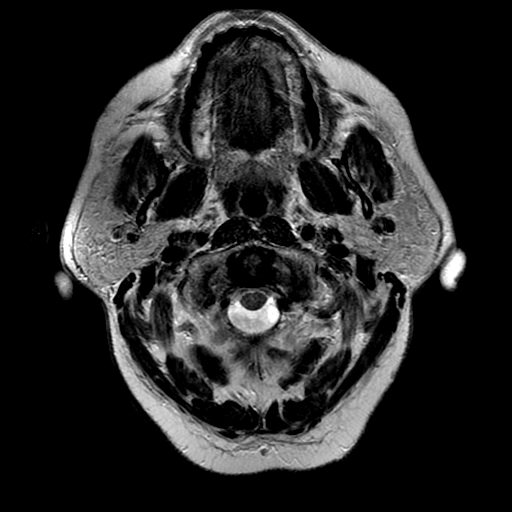
[im 24/24]
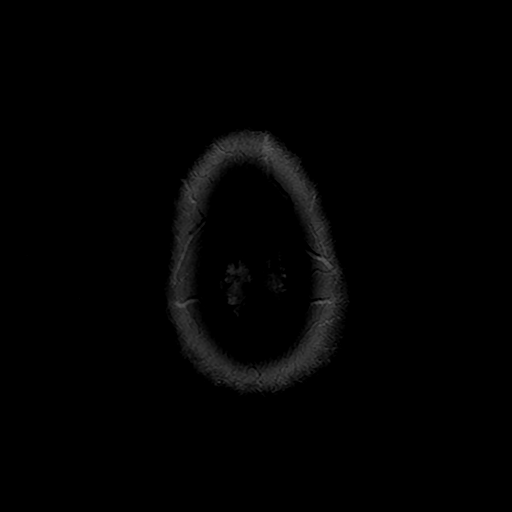

[Series 7: FLAIR · axial · 5.0mm · 0.43mm/px · z∈[-64,+85]mm · 2 of 24 slices shown]
[im 1/24]
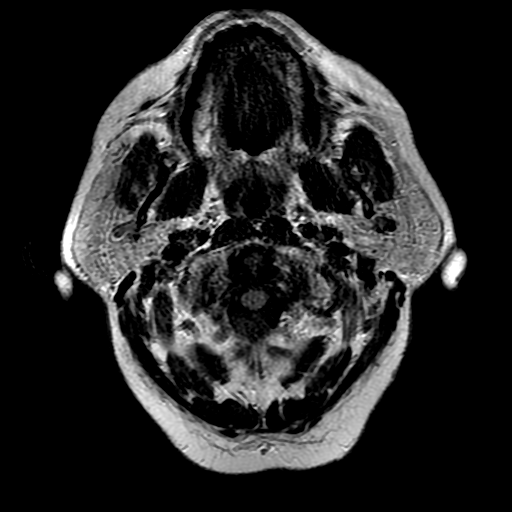
[im 24/24]
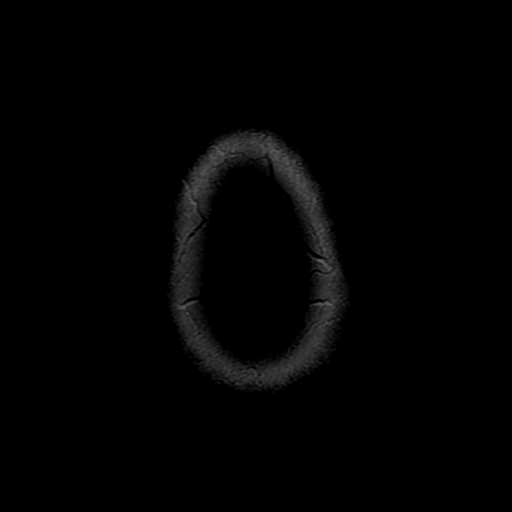

[Series 10: T2 post-contrast · coronal · 5.0mm · 0.47mm/px · 2 of 24 slices shown]
[im 1/24]
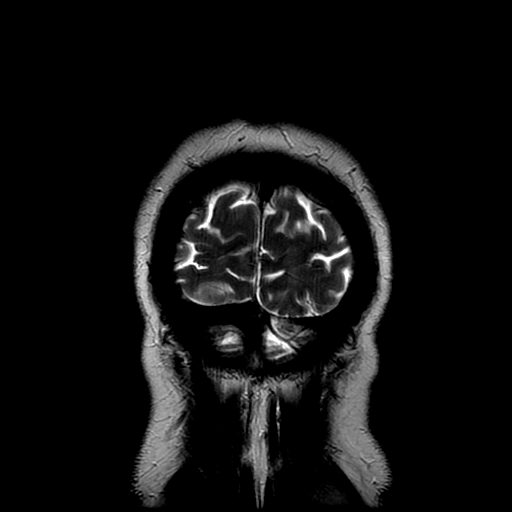
[im 24/24]
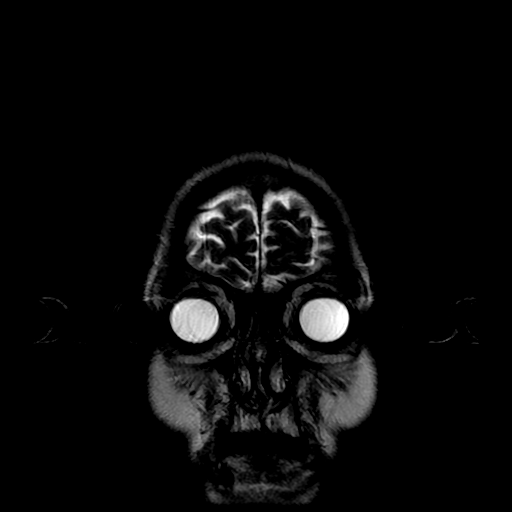

[Series 12: T1 post-contrast · coronal · 5.0mm · 0.47mm/px · 3 of 29 slices shown (1 of 2)]
[im 1/29]
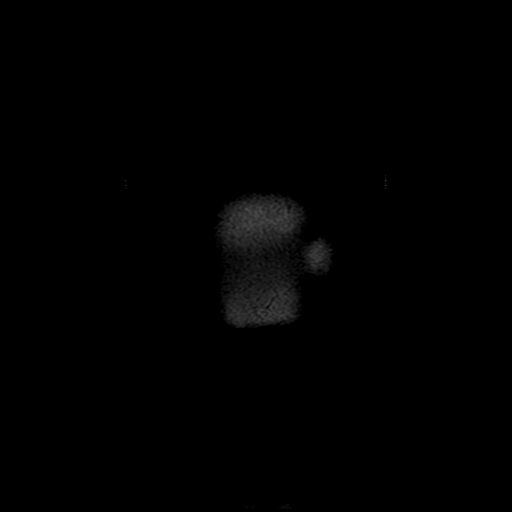
[im 15/29]
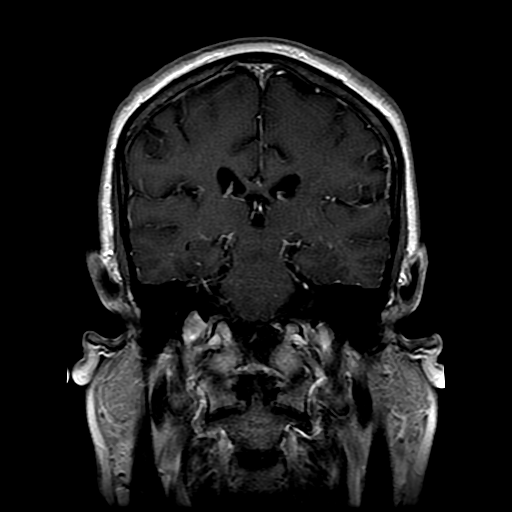
[im 29/29]
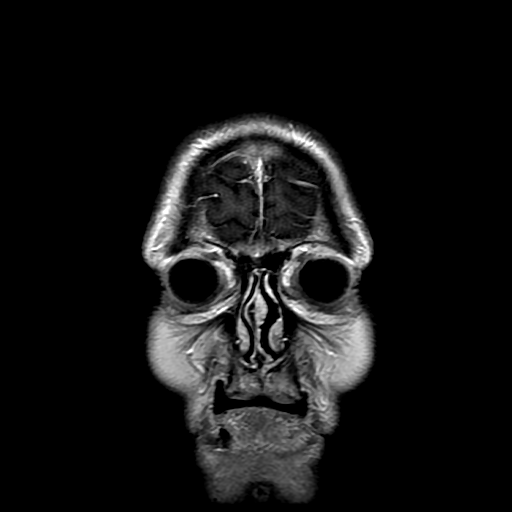

[Series 13: T1 post-contrast · sagittal · 5.0mm · 0.47mm/px · 2 of 24 slices shown (2 of 2)]
[im 1/24]
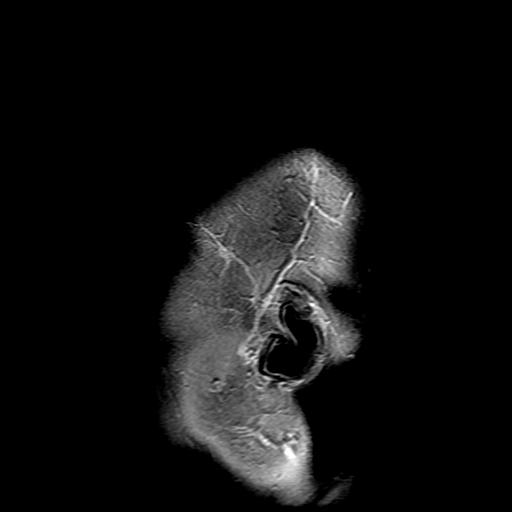
[im 24/24]
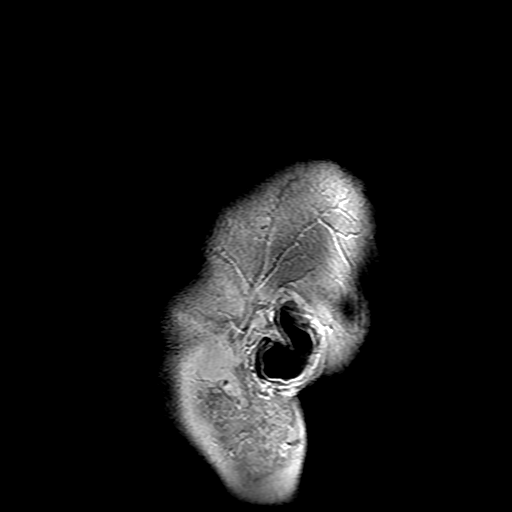

[Series 400: DWI · axial · 3.0mm · 1.09mm/px · z∈[-54,+93]mm · 4 of 50 slices shown (3 of 4)]
[im 1/50]
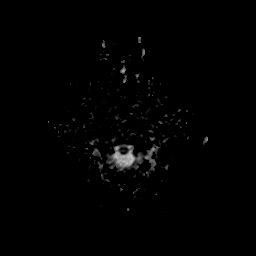
[im 17/50]
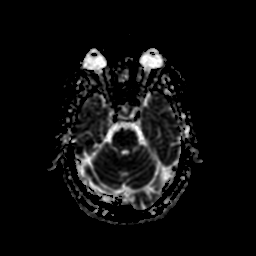
[im 33/50]
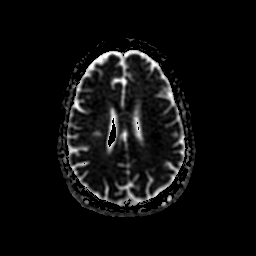
[im 50/50]
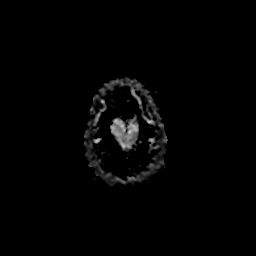

[Series 500: DWI · coronal · 5.0mm · 1.09mm/px · 3 of 34 slices shown (4 of 4)]
[im 1/34]
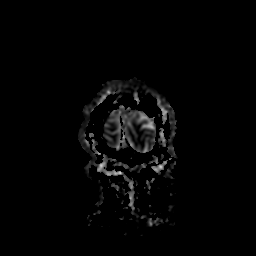
[im 17/34]
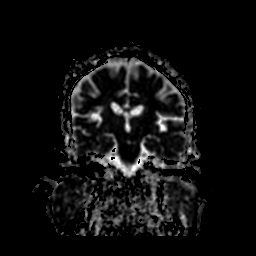
[im 34/34]
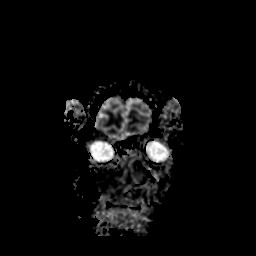

[34 of 48 positions shown; findings below may reference images not displayed]

FINDINGS: Brain: Diffusion imaging does not show any acute or subacute
infarction. There is an old small vessel infarction in the right
cerebellum. There is old cortical and subcortical infarction in the
deep insula and frontoparietal region on the right. There are mild
chronic small-vessel ischemic changes of the cerebral hemispheric
white matter. Old small lacunar infarction right caudate. No
evidence of mass lesion, hemorrhage, hydrocephalus or extra-axial
collection.

Vascular: Major vessels at the base of the brain show flow.

Skull and upper cervical spine: Negative

Sinuses/Orbits: Clear/normal

Other: None significant
IMPRESSION: No evidence of metastatic disease.

Old right cerebellar infarction. Old right deep insular and frontal
parietal infarction.
